# Patient Record
Sex: Female | Born: 1940
Health system: Southern US, Community
[De-identification: ages and names within clinical notes are randomized; demographics above are authoritative.]

## PROBLEM LIST (undated history)

## (undated) DIAGNOSIS — J309 Allergic rhinitis, unspecified: Secondary | ICD-10-CM

## (undated) DIAGNOSIS — R7303 Prediabetes: Secondary | ICD-10-CM

## (undated) DIAGNOSIS — R002 Palpitations: Secondary | ICD-10-CM

## (undated) DIAGNOSIS — I4891 Unspecified atrial fibrillation: Secondary | ICD-10-CM

## (undated) DIAGNOSIS — N059 Unspecified nephritic syndrome with unspecified morphologic changes: Secondary | ICD-10-CM

## (undated) DIAGNOSIS — K5730 Diverticulosis of large intestine without perforation or abscess without bleeding: Secondary | ICD-10-CM

## (undated) DIAGNOSIS — K219 Gastro-esophageal reflux disease without esophagitis: Secondary | ICD-10-CM

## (undated) DIAGNOSIS — T7840XA Allergy, unspecified, initial encounter: Secondary | ICD-10-CM

## (undated) DIAGNOSIS — E7800 Pure hypercholesterolemia, unspecified: Secondary | ICD-10-CM

## (undated) DIAGNOSIS — Z8719 Personal history of other diseases of the digestive system: Secondary | ICD-10-CM

## (undated) DIAGNOSIS — Z87442 Personal history of urinary calculi: Secondary | ICD-10-CM

## (undated) DIAGNOSIS — M25562 Pain in left knee: Secondary | ICD-10-CM

## (undated) DIAGNOSIS — C50911 Malignant neoplasm of unspecified site of right female breast: Secondary | ICD-10-CM

## (undated) DIAGNOSIS — R519 Headache, unspecified: Secondary | ICD-10-CM

## (undated) DIAGNOSIS — M25462 Effusion, left knee: Secondary | ICD-10-CM

## (undated) DIAGNOSIS — S83209A Unspecified tear of unspecified meniscus, current injury, unspecified knee, initial encounter: Secondary | ICD-10-CM

## (undated) DIAGNOSIS — M1990 Unspecified osteoarthritis, unspecified site: Secondary | ICD-10-CM

## (undated) DIAGNOSIS — K5720 Diverticulitis of large intestine with perforation and abscess without bleeding: Secondary | ICD-10-CM

## (undated) DIAGNOSIS — J189 Pneumonia, unspecified organism: Secondary | ICD-10-CM

## (undated) HISTORY — DX: Diverticulitis of large intestine with perforation and abscess without bleeding: K57.20

## (undated) HISTORY — DX: Allergic rhinitis, unspecified: J30.9

## (undated) HISTORY — DX: Palpitations: R00.2

## (undated) HISTORY — PX: BREAST BIOPSY: SHX20

## (undated) HISTORY — DX: Allergy, unspecified, initial encounter: T78.40XA

## (undated) HISTORY — PX: CARDIAC CATHETERIZATION: SHX172

## (undated) HISTORY — PX: COLONOSCOPY: SHX174

## (undated) HISTORY — DX: Unspecified atrial fibrillation: I48.91

## (undated) HISTORY — DX: Gastro-esophageal reflux disease without esophagitis: K21.9

## (undated) HISTORY — DX: Unspecified nephritic syndrome with unspecified morphologic changes: N05.9

## (undated) HISTORY — DX: Pure hypercholesterolemia, unspecified: E78.00

## (undated) HISTORY — PX: COLONOSCOPY W/ BIOPSIES AND POLYPECTOMY: SHX1376

## (undated) HISTORY — DX: Diverticulosis of large intestine without perforation or abscess without bleeding: K57.30

---

## 1964-11-04 HISTORY — PX: VAGINAL HYSTERECTOMY: SUR661

## 1984-11-04 DIAGNOSIS — C50911 Malignant neoplasm of unspecified site of right female breast: Secondary | ICD-10-CM

## 1984-11-04 HISTORY — DX: Malignant neoplasm of unspecified site of right female breast: C50.911

## 1984-11-04 HISTORY — PX: BREAST BIOPSY: SHX20

## 1984-11-04 HISTORY — PX: MASTECTOMY: SHX3

## 1985-11-04 HISTORY — PX: BREAST RECONSTRUCTION: SHX9

## 1985-11-04 HISTORY — PX: REDUCTION MAMMAPLASTY: SUR839

## 1986-11-04 HISTORY — PX: CHOLECYSTECTOMY OPEN: SUR202

## 1999-09-14 ENCOUNTER — Other Ambulatory Visit: Admission: RE | Admit: 1999-09-14 | Discharge: 1999-09-14 | Payer: Self-pay | Admitting: Obstetrics and Gynecology

## 2000-12-20 ENCOUNTER — Inpatient Hospital Stay (HOSPITAL_COMMUNITY): Admission: EM | Admit: 2000-12-20 | Discharge: 2000-12-22 | Payer: Self-pay | Admitting: Emergency Medicine

## 2000-12-20 ENCOUNTER — Encounter: Payer: Self-pay | Admitting: Emergency Medicine

## 2005-05-16 ENCOUNTER — Ambulatory Visit: Payer: Self-pay | Admitting: Internal Medicine

## 2005-07-09 ENCOUNTER — Ambulatory Visit: Payer: Self-pay | Admitting: Internal Medicine

## 2005-08-30 ENCOUNTER — Ambulatory Visit: Payer: Self-pay | Admitting: Internal Medicine

## 2005-11-12 ENCOUNTER — Ambulatory Visit: Payer: Self-pay | Admitting: Internal Medicine

## 2005-12-10 ENCOUNTER — Ambulatory Visit: Payer: Self-pay | Admitting: Internal Medicine

## 2006-03-04 ENCOUNTER — Ambulatory Visit: Payer: Self-pay | Admitting: Internal Medicine

## 2006-04-01 ENCOUNTER — Ambulatory Visit: Payer: Self-pay | Admitting: Internal Medicine

## 2006-08-12 ENCOUNTER — Ambulatory Visit: Payer: Self-pay | Admitting: Internal Medicine

## 2006-08-22 ENCOUNTER — Ambulatory Visit: Payer: Self-pay | Admitting: Internal Medicine

## 2006-08-27 ENCOUNTER — Ambulatory Visit: Payer: Self-pay | Admitting: Internal Medicine

## 2006-09-04 ENCOUNTER — Ambulatory Visit: Payer: Self-pay | Admitting: Internal Medicine

## 2007-05-29 DIAGNOSIS — Z853 Personal history of malignant neoplasm of breast: Secondary | ICD-10-CM | POA: Insufficient documentation

## 2007-05-29 DIAGNOSIS — E785 Hyperlipidemia, unspecified: Secondary | ICD-10-CM | POA: Insufficient documentation

## 2007-05-29 DIAGNOSIS — K219 Gastro-esophageal reflux disease without esophagitis: Secondary | ICD-10-CM | POA: Insufficient documentation

## 2008-05-05 ENCOUNTER — Ambulatory Visit: Payer: Self-pay | Admitting: Internal Medicine

## 2008-05-05 DIAGNOSIS — F329 Major depressive disorder, single episode, unspecified: Secondary | ICD-10-CM | POA: Insufficient documentation

## 2008-05-05 DIAGNOSIS — F411 Generalized anxiety disorder: Secondary | ICD-10-CM | POA: Insufficient documentation

## 2008-05-05 DIAGNOSIS — J309 Allergic rhinitis, unspecified: Secondary | ICD-10-CM | POA: Insufficient documentation

## 2008-05-05 DIAGNOSIS — M899 Disorder of bone, unspecified: Secondary | ICD-10-CM | POA: Insufficient documentation

## 2008-05-05 DIAGNOSIS — I10 Essential (primary) hypertension: Secondary | ICD-10-CM | POA: Insufficient documentation

## 2008-05-05 DIAGNOSIS — Z860100 Personal history of colon polyps, unspecified: Secondary | ICD-10-CM | POA: Insufficient documentation

## 2008-05-05 DIAGNOSIS — M949 Disorder of cartilage, unspecified: Secondary | ICD-10-CM

## 2008-05-05 DIAGNOSIS — K589 Irritable bowel syndrome without diarrhea: Secondary | ICD-10-CM | POA: Insufficient documentation

## 2008-05-06 LAB — CONVERTED CEMR LAB
AST: 18 units/L (ref 0–37)
Alkaline Phosphatase: 62 units/L (ref 39–117)
BUN: 12 mg/dL (ref 6–23)
Bilirubin, Direct: 0.1 mg/dL (ref 0.0–0.3)
Chloride: 104 meq/L (ref 96–112)
Eosinophils Absolute: 0.3 10*3/uL (ref 0.0–0.7)
Eosinophils Relative: 3.5 % (ref 0.0–5.0)
GFR calc non Af Amer: 89 mL/min
Hemoglobin, Urine: NEGATIVE
MCV: 94.4 fL (ref 78.0–100.0)
Mucus, UA: NEGATIVE
Neutrophils Relative %: 56.4 % (ref 43.0–77.0)
Nitrite: NEGATIVE
Platelets: 287 10*3/uL (ref 150–400)
Potassium: 4.7 meq/L (ref 3.5–5.1)
RBC / HPF: NONE SEEN
Sodium: 141 meq/L (ref 135–145)
Total Bilirubin: 0.7 mg/dL (ref 0.3–1.2)
Urine Glucose: NEGATIVE mg/dL
Urobilinogen, UA: 0.2 (ref 0.0–1.0)
WBC: 8.1 10*3/uL (ref 4.5–10.5)

## 2008-05-31 ENCOUNTER — Ambulatory Visit: Payer: Self-pay | Admitting: Internal Medicine

## 2008-08-25 ENCOUNTER — Ambulatory Visit: Payer: Self-pay | Admitting: Internal Medicine

## 2008-11-28 ENCOUNTER — Telehealth (INDEPENDENT_AMBULATORY_CARE_PROVIDER_SITE_OTHER): Payer: Self-pay | Admitting: *Deleted

## 2008-12-06 ENCOUNTER — Telehealth (INDEPENDENT_AMBULATORY_CARE_PROVIDER_SITE_OTHER): Payer: Self-pay | Admitting: *Deleted

## 2009-01-05 ENCOUNTER — Encounter: Admission: RE | Admit: 2009-01-05 | Discharge: 2009-01-05 | Payer: Self-pay | Admitting: Obstetrics and Gynecology

## 2009-01-19 ENCOUNTER — Ambulatory Visit: Payer: Self-pay | Admitting: Family Medicine

## 2009-01-19 ENCOUNTER — Encounter: Payer: Self-pay | Admitting: Internal Medicine

## 2009-02-14 ENCOUNTER — Telehealth (INDEPENDENT_AMBULATORY_CARE_PROVIDER_SITE_OTHER): Payer: Self-pay | Admitting: *Deleted

## 2009-04-20 ENCOUNTER — Ambulatory Visit: Payer: Self-pay | Admitting: Internal Medicine

## 2009-05-01 ENCOUNTER — Telehealth: Payer: Self-pay | Admitting: Internal Medicine

## 2009-05-01 ENCOUNTER — Encounter: Payer: Self-pay | Admitting: Internal Medicine

## 2009-05-03 ENCOUNTER — Ambulatory Visit: Payer: Self-pay | Admitting: Internal Medicine

## 2009-05-04 ENCOUNTER — Encounter: Admission: RE | Admit: 2009-05-04 | Discharge: 2009-05-04 | Payer: Self-pay | Admitting: Orthopedic Surgery

## 2009-06-29 ENCOUNTER — Ambulatory Visit: Payer: Self-pay | Admitting: Internal Medicine

## 2009-06-29 DIAGNOSIS — R300 Dysuria: Secondary | ICD-10-CM | POA: Insufficient documentation

## 2009-06-29 LAB — CONVERTED CEMR LAB
Ketones, ur: NEGATIVE mg/dL
Specific Gravity, Urine: <=1.005 (ref 1.000–1.030)
Total Protein, Urine: NEGATIVE mg/dL
Urine Glucose: NEGATIVE mg/dL
pH: 6.5 (ref 5.0–8.0)

## 2009-06-30 ENCOUNTER — Encounter: Payer: Self-pay | Admitting: Internal Medicine

## 2009-07-06 ENCOUNTER — Encounter: Admission: RE | Admit: 2009-07-06 | Discharge: 2009-07-06 | Payer: Self-pay | Admitting: Obstetrics and Gynecology

## 2009-08-17 ENCOUNTER — Ambulatory Visit: Payer: Self-pay | Admitting: Internal Medicine

## 2009-08-31 ENCOUNTER — Ambulatory Visit: Payer: Self-pay | Admitting: Internal Medicine

## 2009-08-31 DIAGNOSIS — N390 Urinary tract infection, site not specified: Secondary | ICD-10-CM | POA: Insufficient documentation

## 2009-08-31 LAB — CONVERTED CEMR LAB
Bilirubin Urine: NEGATIVE
Ketones, urine, test strip: NEGATIVE
Specific Gravity, Urine: <1.005

## 2009-09-02 ENCOUNTER — Encounter: Payer: Self-pay | Admitting: Internal Medicine

## 2009-09-04 ENCOUNTER — Telehealth (INDEPENDENT_AMBULATORY_CARE_PROVIDER_SITE_OTHER): Payer: Self-pay | Admitting: *Deleted

## 2010-01-25 ENCOUNTER — Encounter: Admission: RE | Admit: 2010-01-25 | Discharge: 2010-01-25 | Payer: Self-pay | Admitting: Obstetrics and Gynecology

## 2010-02-03 ENCOUNTER — Emergency Department (HOSPITAL_BASED_OUTPATIENT_CLINIC_OR_DEPARTMENT_OTHER): Admission: EM | Admit: 2010-02-03 | Discharge: 2010-02-03 | Payer: Self-pay | Admitting: Emergency Medicine

## 2010-02-03 ENCOUNTER — Ambulatory Visit: Payer: Self-pay | Admitting: Radiology

## 2010-04-11 ENCOUNTER — Ambulatory Visit: Payer: Self-pay | Admitting: Internal Medicine

## 2010-04-11 DIAGNOSIS — H6990 Unspecified Eustachian tube disorder, unspecified ear: Secondary | ICD-10-CM | POA: Insufficient documentation

## 2010-04-11 DIAGNOSIS — H6980 Other specified disorders of Eustachian tube, unspecified ear: Secondary | ICD-10-CM | POA: Insufficient documentation

## 2010-06-21 ENCOUNTER — Ambulatory Visit: Payer: Self-pay | Admitting: Cardiology

## 2010-08-09 ENCOUNTER — Ambulatory Visit: Payer: Self-pay | Admitting: Internal Medicine

## 2010-12-04 NOTE — Assessment & Plan Note (Signed)
Summary: ear problem-oyu   Vital Signs:  Patient profile:   70 year old female Height:      61.5 inches Weight:      146.25 pounds BMI:     27.28 O2 Sat:      96 % on Room air Temp:     98.2 degrees F oral Pulse rate:   88 / minute BP sitting:   110 / 68  (left arm) Cuff size:   regular  Vitals Entered ByShirlean Mylar Ewing (April 11, 2010 3:41 PM)  O2 Flow:  Room air CC: left ear pressure/RE   Primary Care Provider:  [name] MD  CC:  left ear pressure/RE.  History of Present Illness: here with left ear pressure, popping, and ocean sounds wihtout hearing loss, veritgo, n/v, headache, ST, cough and Pt denies CP, sob, doe, wheezing, orthopnea, pnd, worsening LE edema, palps, dizziness or syncope   Has feelling of water in the ear but nothing comes out and pain to the ear canal.    Preventive Screening-Counseling & Management      Drug Use:  no.    Problems Prior to Update: 1)  Eustachian Tube Dysfunction, Left  (ICD-381.81) 2)  Uti  (ICD-599.0) 3)  Dysuria  (ICD-788.1) 4)  Diverticulosis, Colon  (ICD-562.10) 5)  Osteopenia  (ICD-733.90) 6)  Anxiety  (ICD-300.00) 7)  Ibs  (ICD-564.1) 8)  Colonic Polyps, Hx of  (ICD-V12.72) 9)  Depression  (ICD-311) 10)  Allergic Rhinitis  (ICD-477.9) 11)  Hypertension  (ICD-401.9) 12)  Hyperlipidemia  (ICD-272.4) 13)  Gerd  (ICD-530.81) 14)  Breast Cancer, Hx of  (ICD-V10.3)  Medications Prior to Update: 1)  Metoprolol Tartrate 50 Mg  Tabs (Metoprolol Tartrate) .... 1/2 By Mouth Bid 2)  Adult Aspirin Ec Low Strength 81 Mg  Tbec (Aspirin) .Marland Kitchen.. 1 By Mouth Once Daily 3)  Dicyclomine Hcl 10 Mg  Caps (Dicyclomine Hcl) .Marland Kitchen.. 1 - 2 By Mouth Q 6 Hrs As Needed Pain 4)  Omeprazole 40 Mg  Cpdr (Omeprazole) .Marland Kitchen.. 1 Each Day 30 Minutes Before Meal 5)  Macrobid 100 Mg Caps (Nitrofurantoin Monohyd Macro) .... One By Mouth Two Times A Day For 5 Days  Current Medications (verified): 1)  Metoprolol Tartrate 50 Mg  Tabs (Metoprolol Tartrate) .... 1/2 By  Mouth Bid 2)  Adult Aspirin Ec Low Strength 81 Mg  Tbec (Aspirin) .Marland Kitchen.. 1 By Mouth Once Daily 3)  Dicyclomine Hcl 10 Mg  Caps (Dicyclomine Hcl) .Marland Kitchen.. 1 - 2 By Mouth Q 6 Hrs As Needed Pain 4)  Omeprazole 40 Mg  Cpdr (Omeprazole) .Marland Kitchen.. 1 Each Day 30 Minutes Before Meal 5)  Fluticasone Propionate 50 Mcg/act Susp (Fluticasone Propionate) .... 2 Spray/side Once Daily 6)  Cetirizine Hcl 10 Mg Tabs (Cetirizine Hcl) .Marland Kitchen.. 1 By Mouth Once Daily As Needed  Allergies (verified): 1)  ! Pcn  Past History:  Past Medical History: Last updated: 05/05/2008 Breast cancer, hx of GERD Hyperlipidemia kidney disease Hypertension Allergic rhinitis Depression Colonic polyps, hx of sympt PVC's IBS Anxiety Osteopenia Diverticulosis, colon  Social History: Last updated: 04/11/2010 Never Smoked Alcohol use-no widow 2 children homemaker Drug use-no  Risk Factors: Smoking Status: never (08/31/2009)  Past Surgical History: s/p right mastectomy Cholecystectomy Hysterectomy s/p right humerus fx april 2011  Social History: Reviewed history from 05/05/2008 and no changes required. Never Smoked Alcohol use-no widow 2 children homemaker Drug use-no Drug Use:  no  Review of Systems       all otherwise negative per pt -  Physical Exam  General:  alert and well-developed.   Head:  normocephalic and no abnormalities observed.   Eyes:  vision grossly intact, pupils equal, and pupils round.   Ears:  R ear normal.  , left tm midl to mod erythema, post fluid, no bulging Nose:  no external deformity and no nasal discharge.   Mouth:  no gingival abnormalities and pharynx pink and moist.   Neck:  supple and no masses.   Lungs:  normal respiratory effort and normal breath sounds.   Heart:  normal rate and regular rhythm.   Extremities:  no edema, no erythema    Impression & Recommendations:  Problem # 1:  EUSTACHIAN TUBE DYSFUNCTION, LEFT (ICD-381.81) with left tinnitus;  d/w pt nature of  the problem , for mucinex, valsalva maneuver as needed, and tx for allergies as below;  would avoid decongestants in her case due to risk  Problem # 2:  ALLERGIC RHINITIS (ICD-477.9)  Her updated medication list for this problem includes:    Fluticasone Propionate 50 Mcg/act Susp (Fluticasone propionate) .Marland Kitchen... 2 spray/side once daily    Cetirizine Hcl 10 Mg Tabs (Cetirizine hcl) .Marland Kitchen... 1 by mouth once daily as needed treat as above, f/u any worsening signs or symptoms   Problem # 3:  OSTEOPENIA (ICD-733.90) d/w pt recent dxa - mild density loss only;  to cont wt bearing excercise;  calcium and vit d; f/u dxa 2 yrs  Problem # 4:  HYPERTENSION (ICD-401.9)  Her updated medication list for this problem includes:    Metoprolol Tartrate 50 Mg Tabs (Metoprolol tartrate) .Marland Kitchen... 1/2 by mouth bid  BP today: 110/68 Prior BP: 128/80 (08/31/2009)  Labs Reviewed: K+: 4.7 (05/05/2008) Creat: : 0.7 (05/05/2008)    stable overall by hx and exam, ok to continue meds/tx as is   Complete Medication List: 1)  Metoprolol Tartrate 50 Mg Tabs (Metoprolol tartrate) .... 1/2 by mouth bid 2)  Adult Aspirin Ec Low Strength 81 Mg Tbec (Aspirin) .Marland Kitchen.. 1 by mouth once daily 3)  Dicyclomine Hcl 10 Mg Caps (Dicyclomine hcl) .Marland Kitchen.. 1 - 2 by mouth q 6 hrs as needed pain 4)  Omeprazole 40 Mg Cpdr (Omeprazole) .Marland Kitchen.. 1 each day 30 minutes before meal 5)  Fluticasone Propionate 50 Mcg/act Susp (Fluticasone propionate) .... 2 spray/side once daily 6)  Cetirizine Hcl 10 Mg Tabs (Cetirizine hcl) .Marland Kitchen.. 1 by mouth once daily as needed  Patient Instructions: 1)  Please take all new medications as prescribed 2)  Continue all previous medications as before this visit  3)  You can also use Mucinex OTC or it's generic for congestion  4)  You can also try the Valsalva maneuver three times daily to help the left ear congestion 5)  Please schedule a follow-up appointment as needed. 6)  Please schedule a follow-up appointment in 4  months with CPX labs Prescriptions: FLUTICASONE PROPIONATE 50 MCG/ACT SUSP (FLUTICASONE PROPIONATE) 2 spray/side once daily  #1 x 11   Entered and Authorized by:   Biagio Borg MD   Signed by:   Biagio Borg MD on 04/11/2010   Method used:   Print then Give to Patient   RxID:   [id number] CETIRIZINE HCL 10 MG TABS (CETIRIZINE HCL) 1 by mouth once daily as needed  #30 x 11   Entered and Authorized by:   Biagio Borg MD   Signed by:   Biagio Borg MD on 04/11/2010   Method used:   Print then Give to  Patient   RxID:   [id number]

## 2010-12-04 NOTE — Assessment & Plan Note (Signed)
Summary: flu shot-lb   Nurse Visit   Allergies: 1)  ! Pcn  Orders Added: 1)  Flu Vaccine 9yrs + MEDICARE PATIENTS [Q2039] 2)  Administration Flu vaccine - MCR [id number]   Flu Vaccine Consent Questions     Do you have a history of severe allergic reactions to this vaccine? no    Any prior history of allergic reactions to egg and/or gelatin? no    Do you have a sensitivity to the preservative Thimersol? no    Do you have a past history of Guillan-Barre Syndrome? no    Do you currently have an acute febrile illness? no    Have you ever had a severe reaction to latex? no    Vaccine information given and explained to patient? yes    Are you currently pregnant? no    Lot Number:[id number]   Exp Date:05/04/2011   Site Given  Left Deltoid IM

## 2010-12-14 ENCOUNTER — Other Ambulatory Visit: Payer: Self-pay | Admitting: Obstetrics and Gynecology

## 2010-12-14 DIAGNOSIS — N63 Unspecified lump in unspecified breast: Secondary | ICD-10-CM

## 2010-12-20 ENCOUNTER — Other Ambulatory Visit: Payer: Self-pay | Admitting: Obstetrics and Gynecology

## 2010-12-20 ENCOUNTER — Ambulatory Visit
Admission: RE | Admit: 2010-12-20 | Discharge: 2010-12-20 | Disposition: A | Discharge status: Home / Self Care | Payer: Medicare Other | Source: Ambulatory Visit | Attending: Obstetrics and Gynecology | Admitting: Obstetrics and Gynecology

## 2010-12-20 DIAGNOSIS — N63 Unspecified lump in unspecified breast: Secondary | ICD-10-CM

## 2010-12-27 ENCOUNTER — Other Ambulatory Visit: Payer: Self-pay | Admitting: Obstetrics and Gynecology

## 2010-12-27 ENCOUNTER — Ambulatory Visit
Admission: RE | Admit: 2010-12-27 | Discharge: 2010-12-27 | Disposition: A | Discharge status: Home / Self Care | Payer: Medicare Other | Source: Ambulatory Visit | Attending: Obstetrics and Gynecology | Admitting: Obstetrics and Gynecology

## 2010-12-27 ENCOUNTER — Other Ambulatory Visit: Payer: Self-pay | Admitting: Radiology

## 2010-12-27 DIAGNOSIS — N63 Unspecified lump in unspecified breast: Secondary | ICD-10-CM

## 2011-01-01 ENCOUNTER — Ambulatory Visit: Payer: Self-pay | Admitting: Cardiology

## 2011-01-02 ENCOUNTER — Ambulatory Visit: Payer: Self-pay | Admitting: Cardiology

## 2011-01-10 ENCOUNTER — Ambulatory Visit: Payer: Self-pay | Admitting: Cardiology

## 2011-01-16 ENCOUNTER — Encounter: Payer: Self-pay | Admitting: Cardiology

## 2011-01-16 DIAGNOSIS — E7800 Pure hypercholesterolemia, unspecified: Secondary | ICD-10-CM | POA: Insufficient documentation

## 2011-01-16 DIAGNOSIS — I471 Supraventricular tachycardia: Secondary | ICD-10-CM | POA: Insufficient documentation

## 2011-01-16 DIAGNOSIS — R002 Palpitations: Secondary | ICD-10-CM | POA: Insufficient documentation

## 2011-01-24 ENCOUNTER — Telehealth: Payer: Self-pay | Admitting: Cardiology

## 2011-01-24 NOTE — Telephone Encounter (Signed)
Patient called and reported heart feels like it is fluttering and skipping a beat. Would like to be seen sooner than scheduled appt.  Please call.

## 2011-01-30 NOTE — Telephone Encounter (Signed)
Spoke with patient last week about heart skipping beats.  Patient stated she had this happen before and Leslie Bryant had her double her GERD meds, wanted to know if ok to do this.  Advised ok to do short term, but to call back if no better.  Patient has not called back.

## 2011-02-06 ENCOUNTER — Encounter: Payer: Self-pay | Admitting: Cardiology

## 2011-02-06 ENCOUNTER — Telehealth: Payer: Self-pay | Admitting: Cardiology

## 2011-02-06 ENCOUNTER — Ambulatory Visit (INDEPENDENT_AMBULATORY_CARE_PROVIDER_SITE_OTHER): Payer: Medicare Other | Admitting: Cardiology

## 2011-02-06 VITALS — BP 130/76 | HR 84 | Wt 151.0 lb

## 2011-02-06 DIAGNOSIS — E7800 Pure hypercholesterolemia, unspecified: Secondary | ICD-10-CM

## 2011-02-06 DIAGNOSIS — Z853 Personal history of malignant neoplasm of breast: Secondary | ICD-10-CM

## 2011-02-06 DIAGNOSIS — M1711 Unilateral primary osteoarthritis, right knee: Secondary | ICD-10-CM | POA: Insufficient documentation

## 2011-02-06 DIAGNOSIS — R002 Palpitations: Secondary | ICD-10-CM

## 2011-02-06 DIAGNOSIS — IMO0002 Reserved for concepts with insufficient information to code with codable children: Secondary | ICD-10-CM

## 2011-02-06 DIAGNOSIS — M1710 Unilateral primary osteoarthritis, unspecified knee: Secondary | ICD-10-CM

## 2011-02-06 LAB — LIPID PANEL
Cholesterol: 237 mg/dL — ABNORMAL HIGH (ref 0–200)
HDL: 52 mg/dL (ref >39.00)
Total CHOL/HDL Ratio: 5
VLDL: 30.8 mg/dL (ref 0.0–40.0)

## 2011-02-06 LAB — BASIC METABOLIC PANEL
BUN: 10 mg/dL (ref 6–23)
Calcium: 9.2 mg/dL (ref 8.4–10.5)
GFR: 97.34 mL/min (ref >60.00)
Potassium: 4 mEq/L (ref 3.5–5.1)
Sodium: 143 mEq/L (ref 135–145)

## 2011-02-06 LAB — HEPATIC FUNCTION PANEL
AST: 19 U/L (ref 0–37)
Albumin: 3.9 g/dL (ref 3.5–5.2)
Total Bilirubin: 0.6 mg/dL (ref 0.3–1.2)

## 2011-02-06 NOTE — Assessment & Plan Note (Addendum)
The patient has gained 5 pounds since last visit.  She is still trying to watch the high cholesterol foods.She had to stop her Crestor about a month ago because of myalgias.  Today's labs will reflect no Crestor for the past 4 or 5 weeks.  If she needs medication we will need to go with a non-statin drug.

## 2011-02-06 NOTE — Progress Notes (Signed)
HPI: This pleasant 70 year old woman is seen for a six-month followup office visit.  She has a past history of palpitations and a past history of supraventricular tachycardia.  She had a past history of chest pain and had a cardiac catheterization on 12/22/00 which showed normal coronary arteries and Catheter-induced spasm.  She has a past history of elevated cholesterols.  She has developed recent myalgias from her Crestor which has been stopped.  His had occasional palpitations.  She does have a past history of documented supraventricular tachycardia which has responded to beta blocker  Current Outpatient Prescriptions  Medication Sig Dispense Refill  . bumetanide (BUMEX) 1 MG tablet Take 1 mg by mouth as needed.        . Calcium Carbonate-Vitamin D (CALCIUM + D PO) Take 1,200 mg by mouth.        . co-enzyme Q-10 30 MG capsule Take 30 mg by mouth 2 (two) times daily.        . metoprolol (LOPRESSOR) 50 MG tablet Take 25 mg by mouth 2 (two) times daily.        . Omega-3 Fatty Acids (OMEGA 3 PO) Take by mouth 3 (three) times daily.        Marland Kitchen omeprazole (PRILOSEC) 40 MG capsule Take 40 mg by mouth daily.        . rosuvastatin (CRESTOR) 10 MG tablet Take 10 mg by mouth daily.          Allergies  Allergen Reactions  . Crestor (Rosuvastatin Calcium)   . Penicillins     Patient Active Problem List  Diagnoses  . HYPERLIPIDEMIA  . ANXIETY  . DEPRESSION  . EUSTACHIAN TUBE DYSFUNCTION, LEFT  . HYPERTENSION  . ALLERGIC RHINITIS  . GERD  . DIVERTICULOSIS, COLON  . IBS  . UTI  . OSTEOPENIA  . DYSURIA  . BREAST CANCER, HX OF  . COLONIC POLYPS, HX OF  . Palpitations  . Supraventricular tachycardia  . Hypercholesterolemia  . Osteoarthritis, knee  . History of breast cancer    History  Smoking status  . Never Smoker   Smokeless tobacco  . Not on file    History  Alcohol Use: Not on file    No family history on file.  Review of Systems: The patient denies any heat or cold  intolerance.  No weight gain or weight loss.  The patient denies headaches or blurry vision.  There is no cough or sputum production.  The patient denies dizziness.  There is no hematuria or hematochezia.  The patient denies any muscle aches or arthritis.  The patient denies any rash.  The patient denies frequent falling or instability.  There is no history of depression or anxiety.  All other systems were reviewed and are negative.   Physical Exam: Filed Vitals:   02/06/11 0856  BP: 130/76  Pulse: 84   Weight is 151, up 6 pounds.Pupils equal and reactive.   Extraocular Movements are full.  There is no scleral icterus.  The mouth and pharynx are normal.  The neck is supple.  The carotids reveal no bruits.  The jugular venous pressure is normal.  The thyroid is not enlarged.  There is no lymphadenopathy.The chest is clear to percussion and auscultation. There are no rales or rhonchi. Expansion of the chest is symmetrical.The precordium is quiet.  The first heart sound is normal.  The second heart sound is physiologically split.  There is no murmur gallop rub or click.  There is no abnormal  lift or heave.The abdomen is soft and nontender. Bowel sounds are normal. The liver and spleen are not enlarged. There Are no abdominal masses. There are no bruits.The pedal pulses are good.  There is no phlebitis or edema.  There is no cyanosis or clubbing.Strength is normal and symmetrical in all extremities.  There is no lateralizing weakness.  There are no sensory deficits.      Assessment / Plan: Await results of today's labs and then decide on further lipid lowering therapy.  Recheck in 6 months for followup office visit and lab work.

## 2011-02-06 NOTE — Telephone Encounter (Signed)
[place] NEED LAST OFFICE NOTE 4/4//12, EKG FAX [phone]  PHONE [phone] EXT [phone]  SURGERY ON WED. 4/11

## 2011-02-06 NOTE — Assessment & Plan Note (Signed)
This patient has a past history of palpitations and supraventricular tachycardia.  Generally she takes her beta blocker just in the morning but occasionally she will take it twice a day if her palpitations are acting up.  She has not been experiencing any recent chest pain.

## 2011-02-06 NOTE — Assessment & Plan Note (Signed)
The patient has been experiencing pain in her right knee for several months.  She has a torn meniscus of the right knee and is scheduled for outpatient surgery by Dr. Gladstone Lighter next week

## 2011-02-15 ENCOUNTER — Other Ambulatory Visit: Payer: Self-pay | Admitting: *Deleted

## 2011-02-15 DIAGNOSIS — R002 Palpitations: Secondary | ICD-10-CM

## 2011-02-15 DIAGNOSIS — E7800 Pure hypercholesterolemia, unspecified: Secondary | ICD-10-CM

## 2011-02-15 MED ORDER — PRAVASTATIN SODIUM 20 MG PO TABS: 20.0000 mg | ORAL_TABLET | Freq: Every evening | ORAL | Status: DC

## 2011-02-15 MED ORDER — METOPROLOL TARTRATE 50 MG PO TABS: 25.0000 mg | ORAL_TABLET | Freq: Two times a day (BID) | ORAL | Status: DC

## 2011-02-15 NOTE — Telephone Encounter (Signed)
Faxed Rx. Pravastatin 20mg  to [facility]

## 2011-02-15 NOTE — Telephone Encounter (Signed)
Faxed Rx. Lopressor to [facility]

## 2011-03-22 NOTE — Cardiovascular Report (Signed)
[place]. [facility]  Patient:    [name], [name]                      MRN: [id number] Proc. Date: 12/22/00 Adm. Date:  [id number] Attending:  Jenne [facility]                        Cardiac Catheterization  INDICATIONS:  [name] presents with chest pain, had myocardial infarction ruled out and referred for catheterization.  PROCEDURE:  Left heart catheterization with selective coronary angiography, left ventricular angiography.  TYPE AND SITE OF ENTRY:  Percutaneous right femoral artery.  CATHETERS:  A 6 French 4 curved Judkins right and left coronary catheters, 6 French pigtail ventriculographic catheter.  CONTRAST MATERIAL:  Omnipaque.  MEDICATIONS GIVEN PRIOR TO THE PROCEDURE:  Valium 10 mg p.o.  MEDICATIONS GIVEN DURING THE PROCEDURE:  Versed 2 mg IV.  COMMENTS:  The patient tolerated the procedure well.  HEMODYNAMIC DATA:  The aortic pressure was 109/64, LV was 109/7.  There was no aortic valve gradient noted on pullback.  ANGIOGRAPHIC DATA: 1. Left main coronary artery:  Left main coronary artery is normal. 2. Left circumflex:  The left circumflex continues as a large obtuse    marginal.  It is normal. 3. There is a small intermediate coronary which is essentially normal. 4. Left anterior descending:  The left anterior descending is a reasonably    large vessel that crosses the apex.  It has at least three diagonal    vessels.  It is normal. 4. Right coronary artery:  The right coronary artery is a moderately large    dominant vessel.  Additionally, we had catheter-induced spasm proximally    that resolved with intracoronary nitroglycerin.  The vessel was normal.  LEFT VENTRICULAR ANGIOGRAM:  Left ventricular angiogram was performed  in the RAO projection.  Overall cardiac size and silhouette are normal.  Left ventricular function is normal.  OVERALL IMPRESSION: 1. Normal left ventricular function. 2. Normal coronary  arteries. 3. Catheter-induced spasm of the right coronary artery with resolution of    nitroglycerin.  DISCUSSION:  It is felt that [name] is stable from a cardiac standpoint. Perclose was used until early ambulation.  Ancef 1 g IV was given. DD:  12/22/00 TD:  12/23/00 Job: [id number]

## 2011-03-22 NOTE — H&P (Signed)
[place]. [facility]  Patient:    Leslie Bryant                        MRN: [id number] Adm. Date:  12/20/00 Attending:  Ludwig Lean. Doreatha Lew, M.D. Dictator:   Marlane Hatcher. Maxwell Caul, R.N., A.N.P.-C. CC:         Joyice Faster. Mare Ferrari, M.D.                         History and Physical  CHIEF COMPLAINT:  Chest pain.  HISTORY OF PRESENT ILLNESS:  Ms. Leslie Bryant is a pleasant 70 year old white female who basically has no real cardiac history.  She presents to the emergency room department today per her family, with a long episode of chest discomfort.  She notes that she was in her previous state of health when going to bed last night.  After getting out of the bed this morning, she began having discomfort in the mid-sternal chest region.  There was no radiation. There was no shortness of breath, no nausea, no vomiting.  It may have somewhat gone into her back.  This lasted for several hours, and she describes it as indigestion.  She used some Coca Cola, with some relief, but actually has not had any medicines until she was here in the emergency room where she was given nitroglycerin and aspirin, with prompt relief.  She is now admitted for further evaluation.  PAST MEDICAL HISTORY: 1. Palpitations. 2. Status post mastectomy on the right, approximately 17 years ago. 3. Status post cholecystectomy. 4. Gastroesophageal reflux disease. 5. Allergies.  No reports of hypertension, diabetes mellitus, or stroke.  Her cholesterol levels are unknown.  ALLERGIES:  PENICILLIN, which causes itching.  CURRENT MEDICATIONS: 1. Prozac 20 mg q.d. 2. Prevacid q.o.d.  She took her last dose yesterday. 3. Allegra q.d.  FAMILY HISTORY:  Father died in his 60s with a heart attack.  Mother died with an enlarged heart at age 8.  She had a total of eight siblings.  One sister has had congestive heart failure; however, cancer is predominantly present among her siblings.  SOCIAL HISTORY:   She is married.  She has two children.  There is no smoking history, no alcohol use.  REVIEW OF SYSTEMS:  She has had no recent fever or flu or chills.  She has had chronic seasonal allergies and takes Allegra for that.  She has had no lightheadedness, no dizziness.  There has been no syncope.  She does have occasional headaches.  She has had no shortness of breath, no edema.  She has some generalized mild arthritic discomfort.  There is no constipation, no diarrhea, no blood in the stool, and otherwise the review of systems is unremarkable.  PHYSICAL EXAMINATION:  GENERAL:  She is a pleasant young white female, who appears younger than her stated age.  She is pain-free.  VITAL SIGNS:  Blood pressure 138/84, heart rate 86, respirations 20.  SKIN:  Warm and dry, color unremarkable.  LUNGS:  Clear to auscultation.  CARDIAC:  A regular rate and rhythm.  ABDOMEN:  Soft, positive bowel sounds, nontender.  EXTREMITIES:  Without edema.  NEUROLOGIC:  Intact.  LABORATORY DATA:  First CPK is negative with a total of 98, MB fraction pending.  Chemistries show a sodium of 140, potassium 4.1, chloride 104, CO2 of 28, BUN 11, creatinine 0.7, glucose 89.  Hematocrit 37, white count 6.6.  A 12-lead electrocardiogram  showing normal sinus rhythm with no acute changes.  OVERALL IMPRESSION: 1. Prolonged episode of chest pain, responsive to nitroglycerin/aspirin. 2. Gastroesophageal reflux disease.  PLAN:  She will be admitted to telemetry.  Will check cardiac enzymes.  Will place on topical nitrates, as well as Plavix therapy for now.  More than likely will need to proceed on with a cardiac catheterization. DD:  12/20/00 TD:  12/20/00 Job: [id number]

## 2011-05-01 ENCOUNTER — Telehealth: Payer: Self-pay | Admitting: Cardiology

## 2011-05-01 DIAGNOSIS — E785 Hyperlipidemia, unspecified: Secondary | ICD-10-CM

## 2011-05-01 MED ORDER — EZETIMIBE 10 MG PO TABS: 10.0000 mg | ORAL_TABLET | Freq: Every day | ORAL | Status: DC

## 2011-05-01 NOTE — Telephone Encounter (Signed)
Started pravastatin and legs started hurting and she couldn't get out and walk like she had been.  Ran out of pravastatin about a week ago and didn't get refilled to see out it was.  Stated it was better.  Please advised.

## 2011-05-01 NOTE — Telephone Encounter (Signed)
Pt called and wanted to talk to you about cholesterol meds

## 2011-05-01 NOTE — Telephone Encounter (Signed)
Spoke with Dr Mare Ferrari and patient will try zetia if she has never taken.  Spoke with patient and she has never taken zetia, will try. Call back if any problems and continue to watch diet.

## 2011-05-02 NOTE — Telephone Encounter (Signed)
agree

## 2011-07-18 ENCOUNTER — Other Ambulatory Visit: Payer: Self-pay | Admitting: Cardiology

## 2011-07-18 DIAGNOSIS — K219 Gastro-esophageal reflux disease without esophagitis: Secondary | ICD-10-CM

## 2011-07-18 NOTE — Telephone Encounter (Signed)
escribe request  

## 2011-08-09 ENCOUNTER — Ambulatory Visit (INDEPENDENT_AMBULATORY_CARE_PROVIDER_SITE_OTHER): Payer: Medicare Other | Admitting: *Deleted

## 2011-08-09 DIAGNOSIS — E785 Hyperlipidemia, unspecified: Secondary | ICD-10-CM

## 2011-08-09 LAB — HEPATIC FUNCTION PANEL
AST: 30 U/L (ref 0–37)
Albumin: 3.9 g/dL (ref 3.5–5.2)
Total Bilirubin: 0.5 mg/dL (ref 0.3–1.2)

## 2011-08-09 LAB — LIPID PANEL
Cholesterol: 175 mg/dL (ref 0–200)
HDL: 53 mg/dL (ref >39.00)
LDL Cholesterol: 105 mg/dL — ABNORMAL HIGH (ref 0–99)
Triglycerides: 84 mg/dL (ref 0.0–149.0)
VLDL: 16.8 mg/dL (ref 0.0–40.0)

## 2011-08-09 LAB — BASIC METABOLIC PANEL
BUN: 12 mg/dL (ref 6–23)
Calcium: 8.9 mg/dL (ref 8.4–10.5)
Creatinine, Ser: 0.7 mg/dL (ref 0.4–1.2)
GFR: 83.51 mL/min (ref >60.00)
Glucose, Bld: 103 mg/dL — ABNORMAL HIGH (ref 70–99)
Potassium: 4 mEq/L (ref 3.5–5.1)

## 2011-08-14 ENCOUNTER — Ambulatory Visit (INDEPENDENT_AMBULATORY_CARE_PROVIDER_SITE_OTHER): Payer: Medicare Other | Admitting: Cardiology

## 2011-08-14 VITALS — BP 116/76 | HR 106 | Wt 152.0 lb

## 2011-08-14 DIAGNOSIS — IMO0002 Reserved for concepts with insufficient information to code with codable children: Secondary | ICD-10-CM

## 2011-08-14 DIAGNOSIS — M1710 Unilateral primary osteoarthritis, unspecified knee: Secondary | ICD-10-CM

## 2011-08-14 DIAGNOSIS — I119 Hypertensive heart disease without heart failure: Secondary | ICD-10-CM

## 2011-08-14 DIAGNOSIS — R5381 Other malaise: Secondary | ICD-10-CM

## 2011-08-14 DIAGNOSIS — R002 Palpitations: Secondary | ICD-10-CM

## 2011-08-14 DIAGNOSIS — E7800 Pure hypercholesterolemia, unspecified: Secondary | ICD-10-CM

## 2011-08-14 DIAGNOSIS — R5383 Other fatigue: Secondary | ICD-10-CM | POA: Insufficient documentation

## 2011-08-14 DIAGNOSIS — K219 Gastro-esophageal reflux disease without esophagitis: Secondary | ICD-10-CM

## 2011-08-14 NOTE — Assessment & Plan Note (Signed)
The patient has a history of intermittent palpitations.  Since last, visit.  She's had no sustained arrhythmias.  She does complain of occasional chest pain, which is relieved spontaneously.

## 2011-08-14 NOTE — Assessment & Plan Note (Signed)
The patient has hypercholesterolemia.  She did not tolerate Crestor.  She is tolerating Zetia.

## 2011-08-14 NOTE — Progress Notes (Signed)
Leslie Bryant REVEAL Date of Birth:  09/23/1941 [facility] / [facility] [id number] [address]   [place], [place]  [address] [phone]           Fax   [phone]  History of Present Illness: This pleasant 70 year old woman is seen for a scheduled followup office visit.  She has a history of palpitations and supraventricular tachycardia.  She's also had a past history of elevated cholesterol.  She does not have any history of ischemic heart disease.  She presented in 2002 with chest pain and underwent cardiac catheterization by Dr. Tawnya Crook which showed normal coronary arteries and catheter-induced spasm of the right coronary artery with resolution with nitroglycerin.  Her left ventricular function was normal at that time.  Current Outpatient Prescriptions  Medication Sig Dispense Refill  . bumetanide (BUMEX) 1 MG tablet Take 1 mg by mouth as needed.        . Calcium Carbonate-Vitamin D (CALCIUM + D PO) Take 1,200 mg by mouth.        . co-enzyme Q-10 30 MG capsule Take 30 mg by mouth 2 (two) times daily.        Marland Kitchen ezetimibe (ZETIA) 10 MG tablet Take 1 tablet (10 mg total) by mouth daily.  90 tablet  3  . metoprolol (LOPRESSOR) 50 MG tablet Take 0.5 tablets (25 mg total) by mouth 2 (two) times daily.  90 tablet  3  . Multiple Vitamins-Minerals (CENTRUM SILVER PO) Take by mouth daily.        . Omega-3 Fatty Acids (OMEGA 3 PO) Take by mouth 3 (three) times daily.        Marland Kitchen omeprazole (PRILOSEC) 40 MG capsule TAKE 1 CAPSULE DAILY  90 capsule  2    Allergies  Allergen Reactions  . Crestor (Rosuvastatin Calcium)   . Lipitor (Atorvastatin Calcium)     Myalgias   . Penicillins   . Pravastatin     myalgias    Patient Active Problem List  Diagnoses  . HYPERLIPIDEMIA  . ANXIETY  . DEPRESSION  . EUSTACHIAN TUBE DYSFUNCTION, LEFT  . HYPERTENSION  . ALLERGIC RHINITIS  . GERD  . DIVERTICULOSIS, COLON  . IBS  . UTI  . OSTEOPENIA  . DYSURIA  . BREAST CANCER, HX OF  . COLONIC  POLYPS, HX OF  . Palpitations  . Supraventricular tachycardia  . Hypercholesterolemia  . Osteoarthritis, knee  . History of breast cancer    History  Smoking status  . Never Smoker   Smokeless tobacco  . Not on file    History  Alcohol Use: Not on file    Family History  Problem Relation Age of Onset  . Heart attack Father   . Heart failure Sister     Review of Systems: Constitutional: no fever chills diaphoresis or fatigue or change in weight.  Head and neck: no hearing loss, no epistaxis, no photophobia or visual disturbance. Respiratory: No cough, shortness of breath or wheezing. Cardiovascular: No chest pain peripheral edema, palpitations. Gastrointestinal: No abdominal distention, no abdominal pain, no change in bowel habits hematochezia or melena. Genitourinary: No dysuria, no frequency, no urgency, no nocturia. Musculoskeletal:No arthralgias, no back pain, no gait disturbance or myalgias. Neurological: No dizziness, no headaches, no numbness, no seizures, no syncope, no weakness, no tremors. Hematologic: No lymphadenopathy, no easy bruising. Psychiatric: No confusion, no hallucinations, no sleep disturbance.    Physical Exam: Filed Vitals:   08/14/11 1617  BP: 116/76  Pulse: 106  Gen. appearance reveals a well-developed, well-nourished woman in no distress.Pupils equal and reactive.   Extraocular Movements are full.  There is no scleral icterus.  The mouth and pharynx are normal.  The neck is supple.  The carotids reveal no bruits.  The jugular venous pressure is normal.  The thyroid is not enlarged.  There is no lymphadenopathy.  The chest is clear to percussion and auscultation. There are no rales or rhonchi. Expansion of the chest is symmetrical.  The precordium is quiet.  The first heart sound is normal.  The second heart sound is physiologically split.  There is no murmur gallop rub or click.  There is no abnormal lift or heave.  The abdomen is soft and  nontender. Bowel sounds are normal. The liver and spleen are not enlarged. There Are no abdominal masses. There are no bruits.  The pedal pulses are good.  There is no phlebitis or edema.  There is no cyanosis or clubbing. Strength is normal and symmetrical in all extremities.  There is no lateralizing weakness.  There are no sensory deficits.  EKG interpreted by me shows sinus tachycardia and borderline low voltage, but no acute ischemic changes  Assessment / Plan:

## 2011-08-14 NOTE — Patient Instructions (Signed)
continue same dose of medications  Your physician wants you to follow-up in: 6 months You will receive a reminder letter in the mail two months in advance. If you don't receive a letter, please call our office to schedule the follow-up appointment.

## 2011-08-14 NOTE — Assessment & Plan Note (Signed)
The patient has been experiencing some osteoarthritic discomfort of her knees, which prevents her from walking as much as she would like to do.  No surgery is anticipated at this time

## 2011-09-05 ENCOUNTER — Ambulatory Visit (INDEPENDENT_AMBULATORY_CARE_PROVIDER_SITE_OTHER): Payer: Medicare Other | Admitting: *Deleted

## 2011-09-05 DIAGNOSIS — Z23 Encounter for immunization: Secondary | ICD-10-CM

## 2011-11-02 ENCOUNTER — Ambulatory Visit (HOSPITAL_COMMUNITY)
Admission: RE | Admit: 2011-11-02 | Discharge: 2011-11-02 | Disposition: A | Discharge status: Home / Self Care | Payer: Medicare Other | Source: Ambulatory Visit | Attending: Family Medicine | Admitting: Family Medicine

## 2011-11-02 ENCOUNTER — Other Ambulatory Visit: Payer: Self-pay | Admitting: Radiology

## 2011-11-02 ENCOUNTER — Other Ambulatory Visit: Payer: Self-pay | Admitting: Family Medicine

## 2011-11-02 ENCOUNTER — Ambulatory Visit (INDEPENDENT_AMBULATORY_CARE_PROVIDER_SITE_OTHER): Payer: Medicare Other | Admitting: Family Medicine

## 2011-11-02 ENCOUNTER — Encounter (HOSPITAL_COMMUNITY): Payer: Self-pay | Admitting: *Deleted

## 2011-11-02 ENCOUNTER — Encounter: Payer: Self-pay | Admitting: Family Medicine

## 2011-11-02 ENCOUNTER — Inpatient Hospital Stay (HOSPITAL_COMMUNITY)
Admission: EM | Admit: 2011-11-02 | Discharge: 2011-11-08 | DRG: 392 | Disposition: A | Discharge status: Home Health | Payer: Medicare Other | Attending: Surgery | Admitting: Surgery

## 2011-11-02 VITALS — BP 118/74 | Temp 99.5°F | Wt 151.0 lb

## 2011-11-02 DIAGNOSIS — F3289 Other specified depressive episodes: Secondary | ICD-10-CM | POA: Diagnosis present

## 2011-11-02 DIAGNOSIS — K5732 Diverticulitis of large intestine without perforation or abscess without bleeding: Secondary | ICD-10-CM

## 2011-11-02 DIAGNOSIS — R509 Fever, unspecified: Secondary | ICD-10-CM

## 2011-11-02 DIAGNOSIS — Z888 Allergy status to other drugs, medicaments and biological substances status: Secondary | ICD-10-CM

## 2011-11-02 DIAGNOSIS — Z860100 Personal history of colon polyps, unspecified: Secondary | ICD-10-CM

## 2011-11-02 DIAGNOSIS — R1031 Right lower quadrant pain: Secondary | ICD-10-CM

## 2011-11-02 DIAGNOSIS — K5720 Diverticulitis of large intestine with perforation and abscess without bleeding: Secondary | ICD-10-CM

## 2011-11-02 DIAGNOSIS — Z87440 Personal history of urinary (tract) infections: Secondary | ICD-10-CM

## 2011-11-02 DIAGNOSIS — Z853 Personal history of malignant neoplasm of breast: Secondary | ICD-10-CM

## 2011-11-02 DIAGNOSIS — K630 Abscess of intestine: Secondary | ICD-10-CM | POA: Diagnosis present

## 2011-11-02 DIAGNOSIS — I10 Essential (primary) hypertension: Secondary | ICD-10-CM | POA: Diagnosis present

## 2011-11-02 DIAGNOSIS — Z23 Encounter for immunization: Secondary | ICD-10-CM

## 2011-11-02 DIAGNOSIS — Z880 Allergy status to penicillin: Secondary | ICD-10-CM

## 2011-11-02 DIAGNOSIS — F411 Generalized anxiety disorder: Secondary | ICD-10-CM | POA: Diagnosis present

## 2011-11-02 DIAGNOSIS — R109 Unspecified abdominal pain: Secondary | ICD-10-CM

## 2011-11-02 DIAGNOSIS — F329 Major depressive disorder, single episode, unspecified: Secondary | ICD-10-CM | POA: Diagnosis present

## 2011-11-02 HISTORY — DX: Diverticulitis of large intestine with perforation and abscess without bleeding: K57.20

## 2011-11-02 HISTORY — DX: Unspecified osteoarthritis, unspecified site: M19.90

## 2011-11-02 LAB — BASIC METABOLIC PANEL
CO2: 27 mEq/L (ref 19–32)
Calcium: 9.8 mg/dL (ref 8.4–10.5)
Chloride: 93 mEq/L — ABNORMAL LOW (ref 96–112)
Creatinine, Ser: 0.72 mg/dL (ref 0.50–1.10)
Glucose, Bld: 97 mg/dL (ref 70–99)

## 2011-11-02 LAB — POCT I-STAT, CHEM 8
BUN: 8 mg/dL (ref 6–23)
Chloride: 101 mEq/L (ref 96–112)
Creatinine, Ser: 0.8 mg/dL (ref 0.50–1.10)
Glucose, Bld: 94 mg/dL (ref 70–99)
Hemoglobin: 12.9 g/dL (ref 12.0–15.0)
Potassium: 3.8 mEq/L (ref 3.5–5.1)

## 2011-11-02 LAB — DIFFERENTIAL
Basophils Absolute: 0 10*3/uL (ref 0.0–0.1)
Basophils Relative: 0 % (ref 0–1)
Lymphocytes Relative: 17 % (ref 12–46)
Lymphs Abs: 2.6 10*3/uL (ref 0.7–4.0)
Monocytes Absolute: 1.6 10*3/uL — ABNORMAL HIGH (ref 0.1–1.0)
Monocytes Absolute: 1.5 10*3/uL — ABNORMAL HIGH (ref 0.1–1.0)
Monocytes Relative: 11 % (ref 3–12)
Monocytes Relative: 10 % (ref 3–12)
Neutro Abs: 10.1 10*3/uL — ABNORMAL HIGH (ref 1.7–7.7)
Neutro Abs: 10.3 10*3/uL — ABNORMAL HIGH (ref 1.7–7.7)

## 2011-11-02 LAB — CBC
HCT: 35.1 % — ABNORMAL LOW (ref 36.0–46.0)
HCT: 35.6 % — ABNORMAL LOW (ref 36.0–46.0)
Hemoglobin: 12.5 g/dL (ref 12.0–15.0)
MCH: 31.3 pg (ref 26.0–34.0)
MCHC: 35.1 g/dL (ref 30.0–36.0)
MCV: 91.4 fL (ref 78.0–100.0)
Platelets: 316 10*3/uL (ref 150–400)
RBC: 3.84 MIL/uL — ABNORMAL LOW (ref 3.87–5.11)
RBC: 3.91 MIL/uL (ref 3.87–5.11)
WBC: 14.2 10*3/uL — ABNORMAL HIGH (ref 4.0–10.5)

## 2011-11-02 MED ORDER — PANTOPRAZOLE SODIUM 40 MG IV SOLR
40.0000 mg | Freq: Every day | INTRAVENOUS | Status: DC
  Administered 2011-11-02: 21:00:00 via INTRAVENOUS
  Administered 2011-11-03 – 2011-11-07 (×5): 40 mg via INTRAVENOUS
  Filled 2011-11-02 (×7): qty 40

## 2011-11-02 MED ORDER — ONDANSETRON HCL 4 MG/2ML IJ SOLN: 4.0000 mg | Freq: Four times a day (QID) | INTRAMUSCULAR | Status: DC | PRN

## 2011-11-02 MED ORDER — METOPROLOL TARTRATE 25 MG PO TABS
ORAL_TABLET | ORAL | Status: AC
  Filled 2011-11-02: qty 1

## 2011-11-02 MED ORDER — SODIUM CHLORIDE 0.9 % IV BOLUS (SEPSIS)
500.0000 mL | Freq: Once | INTRAVENOUS | Status: DC
  Administered 2011-11-02: 20:00:00 via INTRAVENOUS

## 2011-11-02 MED ORDER — METRONIDAZOLE IN NACL 5-0.79 MG/ML-% IV SOLN
500.0000 mg | Freq: Three times a day (TID) | INTRAVENOUS | Status: DC
  Administered 2011-11-03 – 2011-11-08 (×16): 500 mg via INTRAVENOUS
  Filled 2011-11-02 (×18): qty 100

## 2011-11-02 MED ORDER — METRONIDAZOLE IN NACL 5-0.79 MG/ML-% IV SOLN
500.0000 mg | Freq: Once | INTRAVENOUS | Status: DC
  Administered 2011-11-02: 500 mg via INTRAVENOUS
  Filled 2011-11-02: qty 100

## 2011-11-02 MED ORDER — FENTANYL CITRATE 0.05 MG/ML IJ SOLN
25.0000 ug | INTRAMUSCULAR | Status: DC | PRN
  Administered 2011-11-02 – 2011-11-07 (×5): 25 ug via INTRAVENOUS
  Filled 2011-11-02 (×5): qty 2

## 2011-11-02 MED ORDER — METOPROLOL TARTRATE 25 MG PO TABS
25.0000 mg | ORAL_TABLET | Freq: Two times a day (BID) | ORAL | Status: DC
  Administered 2011-11-02 – 2011-11-08 (×12): 25 mg via ORAL
  Filled 2011-11-02 (×14): qty 1

## 2011-11-02 MED ORDER — CIPROFLOXACIN IN D5W 400 MG/200ML IV SOLN
400.0000 mg | Freq: Two times a day (BID) | INTRAVENOUS | Status: DC
  Administered 2011-11-02 – 2011-11-08 (×12): 400 mg via INTRAVENOUS
  Filled 2011-11-02 (×13): qty 200

## 2011-11-02 MED ORDER — IOHEXOL 300 MG/ML  SOLN
100.0000 mL | Freq: Once | INTRAMUSCULAR | Status: AC | PRN
  Administered 2011-11-02: 100 mL via INTRAVENOUS

## 2011-11-02 MED ORDER — POTASSIUM CHLORIDE IN NACL 20-0.9 MEQ/L-% IV SOLN
INTRAVENOUS | Status: DC
  Administered 2011-11-02 – 2011-11-03 (×4): via INTRAVENOUS
  Filled 2011-11-02 (×6): qty 1000

## 2011-11-02 MED ORDER — PANTOPRAZOLE SODIUM 40 MG IV SOLR
INTRAVENOUS | Status: AC
  Filled 2011-11-02: qty 40

## 2011-11-02 MED ORDER — CIPROFLOXACIN IN D5W 400 MG/200ML IV SOLN
400.0000 mg | Freq: Once | INTRAVENOUS | Status: DC
  Filled 2011-11-02: qty 200

## 2011-11-02 NOTE — H&P (Signed)
Leslie Bryant is an 70 y.o. female.   Chief Complaint: Abdominal pain HPI: This is a 70 year old female who presents with a two-day history of left lower quadrant abdominal pain. She saw her primary care physician today. She was started on antibiotics a CAT scan of the abdomen and pelvis was ordered. Because of findings on the CAT scan she was then sent to the emergency department. The patient describes sharp, moderate pain in the left lower quadrant. She has had no nausea or vomiting. Bowel movements are normal. The pain does not refer anywhere else. She had been having some epigastric discomfort which is now improved. She has no previous history of diverticulitis or abdominal pain. Currently she is comfortable when not moving.  Past Medical History  Diagnosis Date  . Palpitations   . Supraventricular tachycardia   . Hypercholesterolemia   . Broken humerus     Right  . Labile hypertension   . GERD (gastroesophageal reflux disease)   . Chest pain, atypical   . Depression   . Gall bladder stones   . Ulcer     History of ulcer    Past Surgical History  Procedure Date  . Mastectomy 1986    Right  . Cholecystectomy     Family History  Problem Relation Age of Onset  . Heart attack Father   . Heart failure Sister    Social History:  reports that she has never smoked. She does not have any smokeless tobacco history on file. Her alcohol and drug histories not on file.  Allergies:  Allergies  Allergen Reactions  . Crestor (Rosuvastatin Calcium) Other (See Comments)    Shoulder pain  . Lipitor (Atorvastatin Calcium) Other (See Comments)    Myalgias   . Penicillins   . Pravastatin Other (See Comments)    myalgias    Medications Prior to Admission  Medication Dose Route Frequency Provider Last Rate Last Dose  . ciprofloxacin (CIPRO) IVPB 400 mg  400 mg Intravenous Once Domenic Moras, PA      . iohexol (OMNIPAQUE) 300 MG/ML solution 100 mL  100 mL Intravenous Once PRN Medication  Radiologist   100 mL at 11/02/11 1411  . metroNIDAZOLE (FLAGYL) IVPB 500 mg  500 mg Intravenous Once Domenic Moras, PA   500 mg at 11/02/11 1954  . sodium chloride 0.9 % bolus 500 mL  500 mL Intravenous Once Domenic Moras, PA       Medications Prior to Admission  Medication Sig Dispense Refill  . Calcium Carbonate-Vitamin D (CALCIUM + D PO) Take 1,200 mg by mouth.        . co-enzyme Q-10 30 MG capsule Take 30 mg by mouth 2 (two) times daily.       Marland Kitchen ezetimibe (ZETIA) 10 MG tablet Take 1 tablet (10 mg total) by mouth daily.  90 tablet  3  . metoprolol (LOPRESSOR) 50 MG tablet Take 0.5 tablets (25 mg total) by mouth 2 (two) times daily.  90 tablet  3  . Multiple Vitamins-Minerals (CENTRUM SILVER PO) Take 1 tablet by mouth daily.       . Omega-3 Fatty Acids (OMEGA 3 PO) Take 2 tablets by mouth daily.       Marland Kitchen omeprazole (PRILOSEC) 40 MG capsule TAKE 1 CAPSULE DAILY  90 capsule  2    Results for orders placed during the hospital encounter of 11/02/11 (from the past 48 hour(s))  BASIC METABOLIC PANEL     Status: Abnormal   Collection Time  11/02/11 12:40 PM      Component Value Range Comment   Sodium 130 (*) 135 - 145 (mEq/L)    Potassium 4.1  3.5 - 5.1 (mEq/L)    Chloride 93 (*) 96 - 112 (mEq/L)    CO2 27  19 - 32 (mEq/L)    Glucose, Bld 97  70 - 99 (mg/dL)    BUN 11  6 - 23 (mg/dL)    Creatinine, Ser 0.72  0.50 - 1.10 (mg/dL)    Calcium 9.8  8.4 - 10.5 (mg/dL)    GFR calc non Af Amer 84 (*) >90 (mL/min)    GFR calc Af Amer >90  >90 (mL/min)   CBC     Status: Abnormal   Collection Time   11/02/11 12:40 PM      Component Value Range Comment   WBC 14.2 (*) 4.0 - 10.5 (K/uL)    RBC 3.84 (*) 3.87 - 5.11 (MIL/uL)    Hemoglobin 12.0  12.0 - 15.0 (g/dL)    HCT 35.1 (*) 36.0 - 46.0 (%)    MCV 91.4  78.0 - 100.0 (fL)    MCH 31.3  26.0 - 34.0 (pg)    MCHC 34.2  30.0 - 36.0 (g/dL)    RDW 12.1  11.5 - 15.5 (%)    Platelets 316  150 - 400 (K/uL)   DIFFERENTIAL     Status: Abnormal   Collection  Time   11/02/11 12:40 PM      Component Value Range Comment   Neutrophils Relative 72  43 - 77 (%)    Neutro Abs 10.1 (*) 1.7 - 7.7 (K/uL)    Lymphocytes Relative 17  12 - 46 (%)    Lymphs Abs 2.3  0.7 - 4.0 (K/uL)    Monocytes Relative 11  3 - 12 (%)    Monocytes Absolute 1.6 (*) 0.1 - 1.0 (K/uL)    Eosinophils Relative 1  0 - 5 (%)    Eosinophils Absolute 0.1  0.0 - 0.7 (K/uL)    Basophils Relative 0  0 - 1 (%)    Basophils Absolute 0.0  0.0 - 0.1 (K/uL)    Ct Abdomen Pelvis W Contrast  11/02/2011  *RADIOLOGY REPORT*  Clinical Data: Fever.  Abdominal pain with guarding in the right lower quadrant.  CT ABDOMEN AND PELVIS WITH CONTRAST  Technique:  Multidetector CT imaging of the abdomen and pelvis was performed following the standard protocol during bolus administration of intravenous contrast.  Contrast: 156mL OMNIPAQUE IOHEXOL 300 MG/ML IV SOLN  Comparison: None.  Findings: The patient has perforated focal sigmoid diverticulitis with two small abscesses in the pelvis, one being 2.5 cm in diameter and the other being 17 mm in diameter. There is a small amount of air in one of the abscesses.  There is a single small bubble of air just to the left of the larger abscess.  There is a focal sigmoid phlegmon measuring approximately 4.7 cm in length.  The remainder of the bowel demonstrates no acute abnormality. Appendix and terminal ileum are normal.  Diffuse mild hepatic steatosis.  Previous cholecystectomy.  Spleen, pancreas, adrenal glands, and kidneys are normal.  Uterus has been removed.  Ovaries are normal.  No significant osseous abnormality.  IMPRESSION: Perforated sigmoid diverticulitis with two small adjacent abscesses and a 4.7 cm sigmoid phlegmon at the site of the diverticular disease.  Original Report Authenticated By: Larey Seat, M.D.    Review of Systems  Constitutional: Negative.  HENT: Negative.   Eyes: Negative.   Respiratory: Negative.   Cardiovascular: Positive for  palpitations.  Gastrointestinal: Positive for abdominal pain.       Pain in LLQ  Genitourinary: Negative.   Musculoskeletal: Negative.   Skin: Negative.   Neurological: Negative.   Endo/Heme/Allergies: Negative.   Psychiatric/Behavioral: Negative.     Blood pressure 136/79, pulse 122, temperature 99.2 F (37.3 C), temperature source Oral, resp. rate 20, SpO2 98.00%. Physical Exam  Constitutional: She is oriented to person, place, and time. She appears well-developed and well-nourished. No distress.  HENT:  Head: Normocephalic and atraumatic.  Right Ear: External ear normal.  Left Ear: External ear normal.  Nose: Nose normal.  Mouth/Throat: Oropharynx is clear and moist. No oropharyngeal exudate.  Eyes: Conjunctivae are normal. Pupils are equal, round, and reactive to light. No scleral icterus.  Neck: Normal range of motion. Neck supple. No tracheal deviation present. No thyromegaly present.  Cardiovascular: Regular rhythm and normal heart sounds.   No murmur heard.      tachycardic  Respiratory: Effort normal and breath sounds normal. No respiratory distress. She has no wheezes.  GI: Soft. There is tenderness. There is guarding.       Moderate to severe tenderness in LLQ.  Rest of Abdomen soft and non tender  Musculoskeletal: Normal range of motion. She exhibits no edema and no tenderness.  Lymphadenopathy:    She has no cervical adenopathy.  Neurological: She is alert and oriented to person, place, and time.  Skin: Skin is warm and dry. No rash noted. She is not diaphoretic. No erythema.  Psychiatric: Her behavior is normal. Judgment normal.     Assessment/Plan This is a 70 year old female with focally perforated sigmoid diverticulitis and 2 small pelvic abscesses. Currently she is stable and is only focally tender. I do believe this is a contained perforation. I discussed this with her and her family. I'm going to attempt conservative management with bowel rest and IV  antibiotics. Should her condition change, she will need emergent exploratory laparotomy with partial colectomy and colostomy. If she improves, she still may need a repeat CAT scan to see if there is a drainable abscess. We will discuss the CAT scan with interventional radiology currently to see if the small abscesses can be drained, but I suspect they're too small this point.  Marykate Heuberger A 11/02/2011, 8:04 PM

## 2011-11-02 NOTE — Progress Notes (Signed)
  Subjective:    Patient ID: Leslie Bryant, female    DOB: [birth year], 70 y.o.   MRN: [id number]  HPI Sinus sx for about 2 weeks, though feel this is slightly better with these sxs. 2 days ago started having severe stomach cramps. Felt like gas but couldn't get rid of it.  Bowels are moving normally.  Occ nauseated.  Hx of of IBS.  Ran a fever thrusday night when had the cramping.  + chills, No blood in the stool.  Feels better when doesn't eat.  When sits feels a pressure in her stomach, worse.  Normally hs 2 BM a day. Generalized cramping.  Feels sore near her umbilicus.  Tried gas-x, Pepto-bismol, and drinking gingerale to try to burp.  Has had low grade fevers since then. Hx of cholecystectomy. + diverticulosis of colon and IBS.    Review of Systems     BP 118/74  Temp(Src) 99.5 F (37.5 C) (Oral)  Wt 151 lb (68.493 kg)    Allergies  Allergen Reactions  . Crestor (Rosuvastatin Calcium)   . Lipitor (Atorvastatin Calcium)     Myalgias   . Penicillins   . Pravastatin     myalgias    Past Medical History  Diagnosis Date  . Palpitations   . Supraventricular tachycardia   . Hypercholesterolemia   . Broken humerus     Right  . Labile hypertension   . GERD (gastroesophageal reflux disease)   . Chest pain, atypical   . Depression   . Gall bladder stones   . Ulcer     History of ulcer    Past Surgical History  Procedure Date  . Mastectomy 1986    Right  . Cholecystectomy     History   Social History  . Marital Status: Married    Spouse Name: N/A    Number of Children: N/A  . Years of Education: N/A   Occupational History  . Not on file.   Social History Main Topics  . Smoking status: Never Smoker   . Smokeless tobacco: Not on file  . Alcohol Use: Not on file  . Drug Use: Not on file  . Sexually Active: Not on file   Other Topics Concern  . Not on file   Social History Narrative  . No narrative on file    Family History  Problem Relation Age of Onset    . Heart attack Father   . Heart failure Sister      Objective:   Physical Exam  Constitutional: She is oriented to person, place, and time. She appears well-developed and well-nourished.  HENT:  Head: Normocephalic and atraumatic.  Cardiovascular: Normal rate, regular rhythm and normal heart sounds.   Pulmonary/Chest: Effort normal and breath sounds normal.  Abdominal: Soft.       Hyperactive BS and guarding in the RLQ. Mildly tender in the LUQ.   Neurological: She is alert and oriented to person, place, and time.  Skin: Skin is warm and dry.  Psychiatric: She has a normal mood and affect. Her behavior is normal.          Assessment & Plan:  RLQ tenderness with abdominal cramping and fever. Send for abd/pelvis CT with contrast to eval for appendicitis vs diverticulitis. Called radiology and they are scheduling appt.  Will need BMP first. Last cr in October was normal. Will check CBC w/ diff as well. Will call with the results. Called and spoke with CT tech st [facility].

## 2011-11-02 NOTE — ED Notes (Signed)
Pt reports RLQ pain since Thursday night, denies vomiting, last bm was this morning. Went for ct scan today, +perforated diverticulosis. Pt to be seen by edp for admission. No acute distress ntoed at triage.

## 2011-11-02 NOTE — ED Provider Notes (Signed)
History    this is a 70 year old female who was sent from her primary care Dr. to the hospital for admission due to a perforated sigmoid diverticulitis found on outpt CT scan today. Pt sts, for the past several days patient has been complaining of low abdominal pain. Pain begins abruptly on Thursday night follows with nausea, chills and fever. Patient has been seen by her primary care Dr. today and had her abd CT scan.  She was instructed to come to ER to be admitted.  She is currently having 5 out of 10 pain, but does not request pain medication.  She denies headache, cp, sob.    CSN: [id number]  Arrival date & time 11/02/11  [id number]   First MD Initiated Contact with Patient 11/02/11 1902      Chief Complaint  Patient presents with  . Abdominal Pain    (Consider location/radiation/quality/duration/timing/severity/associated sxs/prior treatment) HPI  Past Medical History  Diagnosis Date  . Palpitations   . Supraventricular tachycardia   . Hypercholesterolemia   . Broken humerus     Right  . Labile hypertension   . GERD (gastroesophageal reflux disease)   . Chest pain, atypical   . Depression   . Gall bladder stones   . Ulcer     History of ulcer    Past Surgical History  Procedure Date  . Mastectomy 1986    Right  . Cholecystectomy     Family History  Problem Relation Age of Onset  . Heart attack Father   . Heart failure Sister     History  Substance Use Topics  . Smoking status: Never Smoker   . Smokeless tobacco: Not on file  . Alcohol Use: Not on file    OB History    Grav Para Term Preterm Abortions TAB SAB Ect Mult Living                  Review of Systems  All other systems reviewed and are negative.    Allergies  Crestor; Lipitor; Penicillins; and Pravastatin  Home Medications   Current Outpatient Rx  Name Route Sig Dispense Refill  . ASPIRIN EC 81 MG PO TBEC Oral Take 81 mg by mouth daily.      Marland Kitchen CALCIUM + D PO Oral Take 1,200 mg by mouth.       Marland Kitchen COENZYME Q10 30 MG PO CAPS Oral Take 30 mg by mouth 2 (two) times daily.     Marland Kitchen EZETIMIBE 10 MG PO TABS Oral Take 1 tablet (10 mg total) by mouth daily. 90 tablet 3  . METOPROLOL TARTRATE 50 MG PO TABS Oral Take 0.5 tablets (25 mg total) by mouth 2 (two) times daily. 90 tablet 3  . CENTRUM SILVER PO Oral Take 1 tablet by mouth daily.     . OMEGA 3 PO Oral Take 2 tablets by mouth daily.     Marland Kitchen OMEPRAZOLE 40 MG PO CPDR  TAKE 1 CAPSULE DAILY 90 capsule 2    BP 136/79  Pulse 122  Temp(Src) 99.2 F (37.3 C) (Oral)  Resp 20  SpO2 98%  Physical Exam  Nursing note and vitals reviewed. Constitutional: She appears well-developed and well-nourished. No distress.       Awake, alert, nontoxic appearance  HENT:  Head: Normocephalic and atraumatic.  Eyes: Conjunctivae are normal. Right eye exhibits no discharge. Left eye exhibits no discharge.  Neck: Normal range of motion. Neck supple.  Cardiovascular: Normal rate and regular rhythm.   Pulmonary/Chest:  Effort normal. She has no wheezes. She has no rales. She exhibits no tenderness.  Abdominal: She exhibits distension. There is tenderness in the right lower quadrant and left lower quadrant. There is rigidity and rebound. There is no guarding.  Musculoskeletal: She exhibits no tenderness.       Baseline ROM, no obvious new focal weakness  Neurological:       Mental status and motor strength appears baseline for patient and situation  Skin: No rash noted.  Psychiatric: She has a normal mood and affect.    ED Course  Procedures (including critical care time)  Labs Reviewed - No data to display Ct Abdomen Pelvis W Contrast  11/02/2011  *RADIOLOGY REPORT*  Clinical Data: Fever.  Abdominal pain with guarding in the right lower quadrant.  CT ABDOMEN AND PELVIS WITH CONTRAST  Technique:  Multidetector CT imaging of the abdomen and pelvis was performed following the standard protocol during bolus administration of intravenous contrast.   Contrast: 164mL OMNIPAQUE IOHEXOL 300 MG/ML IV SOLN  Comparison: None.  Findings: The patient has perforated focal sigmoid diverticulitis with two small abscesses in the pelvis, one being 2.5 cm in diameter and the other being 17 mm in diameter. There is a small amount of air in one of the abscesses.  There is a single small bubble of air just to the left of the larger abscess.  There is a focal sigmoid phlegmon measuring approximately 4.7 cm in length.  The remainder of the bowel demonstrates no acute abnormality. Appendix and terminal ileum are normal.  Diffuse mild hepatic steatosis.  Previous cholecystectomy.  Spleen, pancreas, adrenal glands, and kidneys are normal.  Uterus has been removed.  Ovaries are normal.  No significant osseous abnormality.  IMPRESSION: Perforated sigmoid diverticulitis with two small adjacent abscesses and a 4.7 cm sigmoid phlegmon at the site of the diverticular disease.  Original Report Authenticated By: Larey Seat, M.D.     IMP:  Perforated diverticulitis.    MDM  Pt was sent to be admitted due to perforated sigmoid diverticulitis.  IV fluid and abx (cipro/flagyl) has been initiated.  Pt is currently in NAD.  Will call for admission.  I have discussed with my attending who agrees with assessment and plan.    7:55 PM Patient has been seen by Dr. Ninfa Linden, the surgeon, who will admit the patient for further management.  Medical screening examination/treatment/procedure(s) were conducted as a shared visit with non-physician practitioner(s) and myself.  I personally evaluated the patient during the encounter 70 year old woman, usually in good health, with 2 day Hx lower abdominal pain.  Exam shows LLL and suprapubic tenderness.  CT of abdomen and pelvis as outpatient showed perforated diverticulitis.  Pt admitted by Coralie Keens, M.D. General Surgeon.   Katy Apo, M.D.       Domenic Moras, PA 11/02/11 [place] [facility], MD 11/03/11 [phone]

## 2011-11-02 NOTE — ED Notes (Signed)
hospitalist to [phone] paged

## 2011-11-03 LAB — BASIC METABOLIC PANEL
BUN: 7 mg/dL (ref 6–23)
Calcium: 8.8 mg/dL (ref 8.4–10.5)
Chloride: 103 mEq/L (ref 96–112)
Creatinine, Ser: 0.73 mg/dL (ref 0.50–1.10)
GFR calc Af Amer: >90 mL/min (ref >90)
GFR calc Af Amer: >90 mL/min (ref >90)
GFR calc non Af Amer: 84 mL/min — ABNORMAL LOW (ref >90)
GFR calc non Af Amer: 85 mL/min — ABNORMAL LOW (ref >90)
Potassium: 4.2 mEq/L (ref 3.5–5.1)
Sodium: 136 mEq/L (ref 135–145)

## 2011-11-03 LAB — CBC
HCT: 31.5 % — ABNORMAL LOW (ref 36.0–46.0)
HCT: 33.1 % — ABNORMAL LOW (ref 36.0–46.0)
Hemoglobin: 10.3 g/dL — ABNORMAL LOW (ref 12.0–15.0)
Hemoglobin: 11.1 g/dL — ABNORMAL LOW (ref 12.0–15.0)
MCH: 30.2 pg (ref 26.0–34.0)
MCH: 31.1 pg (ref 26.0–34.0)
MCHC: 32.7 g/dL (ref 30.0–36.0)
MCHC: 32.9 g/dL (ref 30.0–36.0)
MCHC: 33.3 g/dL (ref 30.0–36.0)
MCV: 92.4 fL (ref 78.0–100.0)
Platelets: 277 10*3/uL (ref 150–400)
RBC: 3.41 MIL/uL — ABNORMAL LOW (ref 3.87–5.11)
RBC: 3.57 MIL/uL — ABNORMAL LOW (ref 3.87–5.11)
RDW: 12.3 % (ref 11.5–15.5)
RDW: 12.1 % (ref 11.5–15.5)

## 2011-11-03 LAB — DIFFERENTIAL
Basophils Absolute: 0 10*3/uL (ref 0.0–0.1)
Eosinophils Absolute: 0.2 10*3/uL (ref 0.0–0.7)
Eosinophils Relative: 1 % (ref 0–5)
Lymphocytes Relative: 18 % (ref 12–46)
Monocytes Absolute: 1.1 10*3/uL — ABNORMAL HIGH (ref 0.1–1.0)

## 2011-11-03 MED ORDER — BIOTENE DRY MOUTH MT LIQD
15.0000 mL | Freq: Two times a day (BID) | OROMUCOSAL | Status: DC
  Administered 2011-11-04 – 2011-11-07 (×7): 15 mL via OROMUCOSAL

## 2011-11-03 MED ORDER — POTASSIUM CHLORIDE IN NACL 20-0.9 MEQ/L-% IV SOLN
INTRAVENOUS | Status: DC
  Administered 2011-11-04 – 2011-11-07 (×6): via INTRAVENOUS
  Filled 2011-11-03 (×10): qty 1000

## 2011-11-03 MED ORDER — CHLORHEXIDINE GLUCONATE 0.12 % MT SOLN
15.0000 mL | Freq: Two times a day (BID) | OROMUCOSAL | Status: DC
  Administered 2011-11-03 – 2011-11-07 (×8): 15 mL via OROMUCOSAL
  Filled 2011-11-03 (×9): qty 15

## 2011-11-03 MED ORDER — ACETAMINOPHEN 325 MG PO TABS
650.0000 mg | ORAL_TABLET | Freq: Four times a day (QID) | ORAL | Status: DC | PRN
  Administered 2011-11-03 – 2011-11-04 (×2): 650 mg via ORAL
  Filled 2011-11-03: qty 2

## 2011-11-03 MED ORDER — PNEUMOCOCCAL VAC POLYVALENT 25 MCG/0.5ML IJ INJ
0.5000 mL | INJECTION | INTRAMUSCULAR | Status: AC
  Administered 2011-11-04: 0.5 mL via INTRAMUSCULAR
  Filled 2011-11-03: qty 0.5

## 2011-11-03 MED ORDER — HEPARIN SODIUM (PORCINE) 5000 UNIT/ML IJ SOLN
5000.0000 [IU] | Freq: Three times a day (TID) | INTRAMUSCULAR | Status: DC
  Administered 2011-11-03 – 2011-11-06 (×8): 5000 [IU] via SUBCUTANEOUS
  Filled 2011-11-03 (×11): qty 1

## 2011-11-03 NOTE — Progress Notes (Signed)
Subjective: Less abd pain. No n/v. +flatus. Sore in lower abdomen  Objective: Vital signs in last 24 hours: Temp:  [98.3 F (36.8 C)-99.5 F (37.5 C)] 98.9 F (37.2 C) (12/30 1200) Pulse Rate:  [92-122] 92  (12/30 1200) Resp:  [17-31] 18  (12/30 1200) BP: (109-136)/(23-79) 112/53 mmHg (12/30 1200) SpO2:  [92 %-98 %] 96 % (12/30 1200) Weight:  [150 lb 12.7 oz (68.4 kg)] 150 lb 12.7 oz (68.4 kg) (12/29 2100)    Intake/Output from previous day: 12/29 0701 - 12/30 0700 In: 1507.5 [I.V.:1407.5; IV Piggyback:100] Out: 800 [Urine:800] Intake/Output this shift: Total I/O In: 1052.5 [I.V.:752.5; IV Piggyback:300] Out: -   Alert, smiling/laughing cta Reg Soft, a little obese. +bs. Tender to deep palpation in b/l lower abd. No RT/guarding  Lab Results:   Basename 11/03/11 0700 11/03/11 0455  WBC 12.4* 12.2*  HGB 10.9* 10.3*  HCT 33.1* 31.5*  PLT 259 249   BMET  Basename 11/03/11 0700 11/02/11 2010 11/02/11 1240  NA 137 136 --  K 4.1 3.8 --  CL 103 101 --  CO2 25 -- 27  GLUCOSE 105* 94 --  BUN 7 8 --  CREATININE 0.73 0.80 --  CALCIUM 8.8 -- 9.8   PT/INR No results found for this basename: LABPROT:2,INR:2 in the last 72 hours ABG No results found for this basename: PHART:2,PCO2:2,PO2:2,HCO3:2 in the last 72 hours  Studies/Results: Ct Abdomen Pelvis W Contrast  11/02/2011  *RADIOLOGY REPORT*  Clinical Data: Fever.  Abdominal pain with guarding in the right lower quadrant.  CT ABDOMEN AND PELVIS WITH CONTRAST  Technique:  Multidetector CT imaging of the abdomen and pelvis was performed following the standard protocol during bolus administration of intravenous contrast.  Contrast: 136mL OMNIPAQUE IOHEXOL 300 MG/ML IV SOLN  Comparison: None.  Findings: The patient has perforated focal sigmoid diverticulitis with two small abscesses in the pelvis, one being 2.5 cm in diameter and the other being 17 mm in diameter. There is a small amount of air in one of the abscesses.   There is a single small bubble of air just to the left of the larger abscess.  There is a focal sigmoid phlegmon measuring approximately 4.7 cm in length.  The remainder of the bowel demonstrates no acute abnormality. Appendix and terminal ileum are normal.  Diffuse mild hepatic steatosis.  Previous cholecystectomy.  Spleen, pancreas, adrenal glands, and kidneys are normal.  Uterus has been removed.  Ovaries are normal.  No significant osseous abnormality.  IMPRESSION: Perforated sigmoid diverticulitis with two small adjacent abscesses and a 4.7 cm sigmoid phlegmon at the site of the diverticular disease.  Original Report Authenticated By: Larey Seat, M.D.    Anti-infectives: Anti-infectives     Start     Dose/Rate Route Frequency Ordered Stop   11/02/11 2200   ciprofloxacin (CIPRO) IVPB 400 mg        400 mg 200 mL/hr over 60 Minutes Intravenous Every 12 hours 11/02/11 2013     11/02/11 2015   metroNIDAZOLE (FLAGYL) IVPB 500 mg        500 mg 100 mL/hr over 60 Minutes Intravenous Every 8 hours 11/02/11 2013     11/02/11 1930   ciprofloxacin (CIPRO) IVPB 400 mg  Status:  Discontinued        400 mg 200 mL/hr over 60 Minutes Intravenous  Once 11/02/11 1921 11/02/11 2013   11/02/11 1930   metroNIDAZOLE (FLAGYL) IVPB 500 mg  Status:  Discontinued  500 mg 100 mL/hr over 60 Minutes Intravenous  Once 11/02/11 1921 11/02/11 2013          Assessment/Plan: Microperforated sigmoid diverticulitis with phlegmon  Cont bowel rest, IVF, IV abx.  Transfer to floor Repeat labs in am Will prob need repeat ct mid-week to evaluate fluid collections   Leighton Ruff. Redmond Pulling, MD, FACS General, Bariatric, & Minimally Invasive Surgery [facility], [place]    LOS: 1 day    Gayland Curry 11/03/2011

## 2011-11-04 LAB — DIFFERENTIAL
Basophils Relative: 0 % (ref 0–1)
Eosinophils Absolute: 0.2 10*3/uL (ref 0.0–0.7)
Lymphs Abs: 2.1 10*3/uL (ref 0.7–4.0)
Neutrophils Relative %: 68 % (ref 43–77)

## 2011-11-04 LAB — BASIC METABOLIC PANEL
GFR calc Af Amer: >90 mL/min (ref >90)
GFR calc non Af Amer: 87 mL/min — ABNORMAL LOW (ref >90)
Potassium: 4 mEq/L (ref 3.5–5.1)
Sodium: 136 mEq/L (ref 135–145)

## 2011-11-04 LAB — CBC
MCH: 30.6 pg (ref 26.0–34.0)
Platelets: 280 10*3/uL (ref 150–400)
RBC: 3.46 MIL/uL — ABNORMAL LOW (ref 3.87–5.11)

## 2011-11-04 LAB — MAGNESIUM: Magnesium: 2.2 mg/dL (ref 1.5–2.5)

## 2011-11-04 NOTE — Progress Notes (Addendum)
Subjective: Feeling better. Not much pain. Voiding without problems. Has had 2 small bowel movements. No nausea.  No fever. WBC down to 10,100.  Objective: Vital signs in last 24 hours: Temp:  [97.9 F (36.6 C)-99.5 F (37.5 C)] 98.3 F (36.8 C) (12/31 0500) Pulse Rate:  [92-109] 93  (12/31 0500) Resp:  [18-26] 18  (12/31 0500) BP: (112-134)/(53-72) 121/60 mmHg (12/31 0500) SpO2:  [95 %-97 %] 97 % (12/31 0500) Last BM Date: 11/04/11  Intake/Output from previous day: 12/30 0701 - 12/31 0700 In: 3529 [I.V.:3229; IV Piggyback:300] Out: 300 [Urine:300] Intake/Output this shift:    General appearance: no distress Resp: clear to auscultation bilaterally GI: abdomen is soft. Minimally tender left lower quadrant. Not distended. No mass. No guarding.  Lab Results:   [facility] 11/04/11 0655 11/03/11 2136  WBC 10.1 12.0*  HGB 10.6* 11.1*  HCT 32.1* 33.3*  PLT 280 277   BMET  Basename 11/03/11 2136 11/03/11 0700  NA 136 137  K 4.2 4.1  CL 103 103  CO2 22 25  GLUCOSE 93 105*  BUN 8 7  CREATININE 0.71 0.73  CALCIUM 8.8 8.8   PT/INR No results found for this basename: LABPROT:2,INR:2 in the last 72 hours ABG No results found for this basename: PHART:2,PCO2:2,PO2:2,HCO3:2 in the last 72 hours  Studies/Results: Ct Abdomen Pelvis W Contrast  11/02/2011  *RADIOLOGY REPORT*  Clinical Data: Fever.  Abdominal pain with guarding in the right lower quadrant.  CT ABDOMEN AND PELVIS WITH CONTRAST  Technique:  Multidetector CT imaging of the abdomen and pelvis was performed following the standard protocol during bolus administration of intravenous contrast.  Contrast: 167mL OMNIPAQUE IOHEXOL 300 MG/ML IV SOLN  Comparison: None.  Findings: The patient has perforated focal sigmoid diverticulitis with two small abscesses in the pelvis, one being 2.5 cm in diameter and the other being 17 mm in diameter. There is a small amount of air in one of the abscesses.  There is a single small  bubble of air just to the left of the larger abscess.  There is a focal sigmoid phlegmon measuring approximately 4.7 cm in length.  The remainder of the bowel demonstrates no acute abnormality. Appendix and terminal ileum are normal.  Diffuse mild hepatic steatosis.  Previous cholecystectomy.  Spleen, pancreas, adrenal glands, and kidneys are normal.  Uterus has been removed.  Ovaries are normal.  No significant osseous abnormality.  IMPRESSION: Perforated sigmoid diverticulitis with two small adjacent abscesses and a 4.7 cm sigmoid phlegmon at the site of the diverticular disease.  Original Report Authenticated By: Larey Seat, M.D.    Anti-infectives: Anti-infectives     Start     Dose/Rate Route Frequency Ordered Stop   11/02/11 2200   ciprofloxacin (CIPRO) IVPB 400 mg        400 mg 200 mL/hr over 60 Minutes Intravenous Every 12 hours 11/02/11 2013     11/02/11 2015   metroNIDAZOLE (FLAGYL) IVPB 500 mg        500 mg 100 mL/hr over 60 Minutes Intravenous Every 8 hours 11/02/11 2013     11/02/11 1930   ciprofloxacin (CIPRO) IVPB 400 mg  Status:  Discontinued        400 mg 200 mL/hr over 60 Minutes Intravenous  Once 11/02/11 1921 11/02/11 2013   11/02/11 1930   metroNIDAZOLE (FLAGYL) IVPB 500 mg  Status:  Discontinued        500 mg 100 mL/hr over 60 Minutes Intravenous  Once 11/02/11 1921 11/02/11  2013          Assessment/Plan: Acute sigmoid diverticulitis with microperforation, phlegmon, and microabscesses.  Clinically she is improving  Begin clear liquid diet.  Continue IV antibiotics.  Repeat CT midweek to re-evaluate fluid collections.  Subcutaneous heparin for DVT prophylaxis  Hypertension. Well-controlled.  History supraventricular tachycardia. On prophylactic beta blockers.    LOS: 2 days    Evyn Putzier M 11/04/2011

## 2011-11-05 NOTE — Progress Notes (Signed)
Patient ID: Leslie Bryant, female   DOB: 1941-09-25, 71 y.o.   MRN: [id number]    Subjective: Pt feels better.  No pain except slight gas pain with eating.  Tolerating clear liquids.  Objective: Vital signs in last 24 hours: Temp:  [98.3 F (36.8 C)-98.8 F (37.1 C)] 98.6 F (37 C) (01/01 0503) Pulse Rate:  [91-99] 92  (01/01 0503) Resp:  [18] 18  (01/01 0503) BP: (134-141)/(70-84) 137/84 mmHg (01/01 0503) SpO2:  [96 %-99 %] 99 % (01/01 0503) Last BM Date: 11/04/11  Intake/Output from previous day: 12/31 0701 - 01/01 0700 In: 3058.5 [I.V.:3058.5] Out: 3200 [Urine:3200] Intake/Output this shift:    PE: Abd: soft, essentially NT, ND, +BS Ht: regular Lungs: CTAB  Lab Results:   [facility] 11/04/11 0655 11/03/11 2136  WBC 10.1 12.0*  HGB 10.6* 11.1*  HCT 32.1* 33.3*  PLT 280 277   BMET  Basename 11/04/11 0655 11/03/11 2136  NA 136 136  K 4.0 4.2  CL 105 103  CO2 21 22  GLUCOSE 82 93  BUN 8 8  CREATININE 0.65 0.71  CALCIUM 8.7 8.8   PT/INR No results found for this basename: LABPROT:2,INR:2 in the last 72 hours   Studies/Results: No results found.  Anti-infectives: Anti-infectives     Start     Dose/Rate Route Frequency Ordered Stop   11/02/11 2200   ciprofloxacin (CIPRO) IVPB 400 mg        400 mg 200 mL/hr over 60 Minutes Intravenous Every 12 hours 11/02/11 2013     11/02/11 2015   metroNIDAZOLE (FLAGYL) IVPB 500 mg        500 mg 100 mL/hr over 60 Minutes Intravenous Every 8 hours 11/02/11 2013     11/02/11 1930   ciprofloxacin (CIPRO) IVPB 400 mg  Status:  Discontinued        400 mg 200 mL/hr over 60 Minutes Intravenous  Once 11/02/11 1921 11/02/11 2013   11/02/11 1930   metroNIDAZOLE (FLAGYL) IVPB 500 mg  Status:  Discontinued        500 mg 100 mL/hr over 60 Minutes Intravenous  Once 11/02/11 1921 11/02/11 2013           Assessment/Plan  1. Perforated diverticulitis with phlegmon  Plan: 1. Adv diet to full liquids 2. Repeat CT scan in the  am to follow phlegmon. 3. Nutrition teaching for diverticulosis diet.   LOS: 3 days    Marysa Wessner E 11/05/2011

## 2011-11-05 NOTE — Progress Notes (Signed)
As per note of KO,PA. She is feeling better with less pain and a benign abdominal exam. Diet advance today and CT tomorrow

## 2011-11-06 ENCOUNTER — Inpatient Hospital Stay (HOSPITAL_COMMUNITY): Payer: Medicare Other

## 2011-11-06 MED ORDER — IOHEXOL 300 MG/ML  SOLN
80.0000 mL | Freq: Once | INTRAMUSCULAR | Status: AC | PRN
  Administered 2011-11-06: 80 mL via INTRAVENOUS

## 2011-11-06 MED ORDER — HEPARIN SODIUM (PORCINE) 5000 UNIT/ML IJ SOLN
5000.0000 [IU] | Freq: Three times a day (TID) | INTRAMUSCULAR | Status: AC
  Administered 2011-11-06 (×2): 5000 [IU] via SUBCUTANEOUS
  Filled 2011-11-06: qty 1

## 2011-11-06 NOTE — Progress Notes (Signed)
Pt refused IV restart.  Discussed that IV sites are rotated after 4 days.  No complaints of pain at site, IV fluids currently infusing.

## 2011-11-06 NOTE — Plan of Care (Signed)
Problem: Food- and Nutrition-Related Knowledge Deficit (NB-1.1) Goal: Nutrition education Formal process to instruct or train a patient/client in a skill or to impart knowledge to help patients/clients voluntarily manage or modify food choices and eating behavior to maintain or improve health.  Outcome: Completed/Met Date Met:  11/06/11 RD consult for diverticulosis diet education. Handouts provided from [employer]. Currently on a Low Fiber diet. No nutrition problems identified PTA. No further nutrition intervention at this time.  Leslie Bryant Pager # [phone]

## 2011-11-06 NOTE — Progress Notes (Signed)
UR of chart completed.  

## 2011-11-06 NOTE — H&P (Signed)
Leslie Bryant is an 71 y.o. female.   Chief Complaint: pelvic pain;  improved since admission.  HPI: possible perforated diverticular abscess.   Past Medical History  Diagnosis Date  . Palpitations   . Supraventricular tachycardia   . Hypercholesterolemia   . Broken humerus     Right  . Labile hypertension   . GERD (gastroesophageal reflux disease)   . Chest pain, atypical   . Depression   . Gall bladder stones   . Ulcer     History of ulcer  . Headache   . Anxiety   . Arthritis     Past Surgical History  Procedure Date  . Mastectomy 1986    Right  . Cholecystectomy   . Rt knee surgery 04/2-12  . Breast surgery   . Abdominal hysterectomy     Family History  Problem Relation Age of Onset  . Heart attack Father   . Heart failure Sister    Social History:  reports that she has never smoked. She does not have any smokeless tobacco history on file. She reports that she drinks about .5 ounces of alcohol per week. Her drug history not on file.  Allergies:  Allergies  Allergen Reactions  . Crestor (Rosuvastatin Calcium) Other (See Comments)    Shoulder pain  . Lipitor (Atorvastatin Calcium) Other (See Comments)    Myalgias   . Penicillins   . Pravastatin Other (See Comments)    myalgias    Medications Prior to Admission  Medication Dose Route Frequency Provider Last Rate Last Dose  . 0.9 % NaCl with KCl 20 mEq/ L  infusion   Intravenous Continuous Henreitta Cea, PA 50 mL/hr at 11/05/11 1232    . acetaminophen (TYLENOL) tablet 650 mg  650 mg Oral Q6H PRN Gayland Curry, MD   650 mg at 11/04/11 0401  . antiseptic oral rinse (BIOTENE) solution 15 mL  15 mL Mouth Rinse q12n4p Gayland Curry, MD   15 mL at 11/06/11 1200  . chlorhexidine (PERIDEX) 0.12 % solution 15 mL  15 mL Mouth Rinse BID Gayland Curry, MD   15 mL at 11/06/11 0743  . ciprofloxacin (CIPRO) IVPB 400 mg  400 mg Intravenous Q12H Harl Bowie, MD   400 mg at 11/06/11 1032  . fentaNYL (SUBLIMAZE)  injection 25 mcg  25 mcg Intravenous Q1H PRN Harl Bowie, MD   25 mcg at 11/03/11 1043  . heparin injection 5,000 Units  5,000 Units Subcutaneous Q8H Henreitta Cea, PA   5,000 Units at 11/06/11 1443  . iohexol (OMNIPAQUE) 300 MG/ML solution 100 mL  100 mL Intravenous Once PRN Medication Radiologist   100 mL at 11/02/11 1411  . iohexol (OMNIPAQUE) 300 MG/ML solution 80 mL  80 mL Intravenous Once PRN Medication Radiologist   80 mL at 11/06/11 0956  . metoprolol tartrate (LOPRESSOR) tablet 25 mg  25 mg Oral BID Harl Bowie, MD   25 mg at 11/06/11 1032  . metroNIDAZOLE (FLAGYL) IVPB 500 mg  500 mg Intravenous Q8H Harl Bowie, MD   500 mg at 11/06/11 1200  . ondansetron (ZOFRAN) injection 4 mg  4 mg Intravenous Q6H PRN Harl Bowie, MD      . pantoprazole (PROTONIX) injection 40 mg  40 mg Intravenous QHS Harl Bowie, MD   40 mg at 11/05/11 2114  . pneumococcal 23 valent vaccine (PNU-IMMUNE) injection 0.5 mL  0.5 mL Intramuscular Tomorrow-1000 [name], MD   0.5  mL at 11/04/11 1042  . DISCONTD: 0.9 % NaCl with KCl 20 mEq/ L  infusion   Intravenous Continuous Harl Bowie, MD 150 mL/hr at 11/03/11 1900    . DISCONTD: ciprofloxacin (CIPRO) IVPB 400 mg  400 mg Intravenous Once Domenic Moras, PA      . DISCONTD: heparin injection 5,000 Units  5,000 Units Subcutaneous Q8H Gayland Curry, MD   5,000 Units at 11/06/11 0600  . DISCONTD: metoprolol tartrate (LOPRESSOR) 25 MG tablet           . DISCONTD: metroNIDAZOLE (FLAGYL) IVPB 500 mg  500 mg Intravenous Once Domenic Moras, PA   500 mg at 11/02/11 1954  . DISCONTD: sodium chloride 0.9 % bolus 500 mL  500 mL Intravenous Once Domenic Moras, PA       Medications Prior to Admission  Medication Sig Dispense Refill  . Calcium Carbonate-Vitamin D (CALCIUM + D PO) Take 1,200 mg by mouth.        . co-enzyme Q-10 30 MG capsule Take 30 mg by mouth 2 (two) times daily.       Marland Kitchen ezetimibe (ZETIA) 10 MG tablet Take 1 tablet (10 mg total)  by mouth daily.  90 tablet  3  . metoprolol (LOPRESSOR) 50 MG tablet Take 0.5 tablets (25 mg total) by mouth 2 (two) times daily.  90 tablet  3  . Multiple Vitamins-Minerals (CENTRUM SILVER PO) Take 1 tablet by mouth daily.       . Omega-3 Fatty Acids (OMEGA 3 PO) Take 2 tablets by mouth daily.       Marland Kitchen omeprazole (PRILOSEC) 40 MG capsule TAKE 1 CAPSULE DAILY  90 capsule  2    No results found for this or any previous visit (from the past 48 hour(s)). Ct Abdomen Pelvis W Contrast  11/06/2011  *RADIOLOGY REPORT*  Clinical Data: Followup diverticulitis.  CT ABDOMEN AND PELVIS WITH CONTRAST  Technique:  Multidetector CT imaging of the abdomen and pelvis was performed following the standard protocol during bolus administration of intravenous contrast.  Contrast:  80 ml Omnipaque-300.  Comparison: 11/02/2011.  Findings: Diffuse inflammation surrounding the rectosigmoid colon with abscess in the mesentery measuring 3.3 x 1.9 x 4 cm versus prior 2.5 x 1.7 x 3.3 cm.  This is suggestive of sigmoid diverticulitis with perforation and subsequent abscess.  Follow-up until clearance recommended to evaluate underlying colon and exclude malignancy.  Scattered normal sized pelvic lymph nodes slightly asymmetric greater on the left.  Post hysterectomy.  Decompressed urinary bladder.  Fatty infiltration of the liver without focal hepatic lesion.  Post cholecystectomy.  No focal splenic, pancreatic, adrenal or renal mass.  Mitral valve calcifications.  Atherosclerotic type changes of the abdominal aorta with mild narrowing of the celiac artery origin.  Scarring lung bases.  Right breast prosthesis is in place.  No bony destructive lesion.  Degenerative changes lower lumbar spine.  Mild hip joint degenerative changes.  Top normal sized interaortocaval lymph node.  IMPRESSION: Findings consistent with perforated sigmoid diverticulitis with adjacent abscess which has increased minimally in size when compared to the prior exam as  discussed above.  Original Report Authenticated By: Doug Sou, M.D.    ROS : no new changes since seen by surgical team this am.   Blood pressure 102/82, pulse 100, temperature 97.9 F (36.6 C), temperature source Oral, resp. rate 19, height 5\' 2"  (1.575 m), weight 150 lb 12.7 oz (68.4 kg), SpO2 99.00%. Physical Exam : A&O x3, soft abdomen, tender  along pelvis, lungs CTA and CV with RRR.   Assessment/Plan Pelvic collection worrisome for abscess - pt for aspiration or drainage under CT guidance. Procedure details, risks and benefits discussed with patient with her apparent understanding.  Written consent obtained.  Plan for aspiration in AM.   CAMPBELL,PAMELA D 11/06/2011, 3:42 PM

## 2011-11-06 NOTE — Progress Notes (Signed)
Patient ID: Leslie Bryant, female   DOB: Dec 02, 1940, 71 y.o.   MRN: [id number]    Subjective: Pt feels great.  No pain. Tolerating full liquids.  Objective: Vital signs in last 24 hours: Temp:  [98.4 F (36.9 C)-99 F (37.2 C)] 98.9 F (37.2 C) (01/02 0557) Pulse Rate:  [91-95] 93  (01/02 0557) Resp:  [18] 18  (01/02 0557) BP: (128-132)/(66-73) 132/73 mmHg (01/02 0557) SpO2:  [97 %-99 %] 97 % (01/02 0557) Last BM Date: 11/05/11  Intake/Output from previous day: 01/01 0701 - 01/02 0700 In: 3491.4 [I.V.:1691.4; IV Piggyback:1800] Out: 1200 [Urine:1200] Intake/Output this shift:    PE: Abd: soft, NT, ND, +BS  Lab Results:   [facility] 11/04/11 0655 11/03/11 2136  WBC 10.1 12.0*  HGB 10.6* 11.1*  HCT 32.1* 33.3*  PLT 280 277   BMET  Basename 11/04/11 0655 11/03/11 2136  NA 136 136  K 4.0 4.2  CL 105 103  CO2 21 22  GLUCOSE 82 93  BUN 8 8  CREATININE 0.65 0.71  CALCIUM 8.7 8.8   PT/INR No results found for this basename: LABPROT:2,INR:2 in the last 72 hours   Studies/Results: No results found.  Anti-infectives: Anti-infectives     Start     Dose/Rate Route Frequency Ordered Stop   11/02/11 2200   ciprofloxacin (CIPRO) IVPB 400 mg        400 mg 200 mL/hr over 60 Minutes Intravenous Every 12 hours 11/02/11 2013     11/02/11 2015   metroNIDAZOLE (FLAGYL) IVPB 500 mg        500 mg 100 mL/hr over 60 Minutes Intravenous Every 8 hours 11/02/11 2013     11/02/11 1930   ciprofloxacin (CIPRO) IVPB 400 mg  Status:  Discontinued        400 mg 200 mL/hr over 60 Minutes Intravenous  Once 11/02/11 1921 11/02/11 2013   11/02/11 1930   metroNIDAZOLE (FLAGYL) IVPB 500 mg  Status:  Discontinued        500 mg 100 mL/hr over 60 Minutes Intravenous  Once 11/02/11 1921 11/02/11 2013           Assessment/Plan  1. Diverticulitis with phlegmon  Plan: 1. Adv to low fiber diet 2. Await CT results, if phlegmon resolved will switch to po abx, if larger and an abscess will  cont IV abx and look at evaluation for perc drain. 3. Anticipate d/c in am if CT improved.   LOS: 4 days    OSBORNE,KELLY E 11/06/2011 CT shows slight increase in abscess.  Will request review for percutaneous drain.

## 2011-11-07 ENCOUNTER — Inpatient Hospital Stay (HOSPITAL_COMMUNITY): Payer: Medicare Other

## 2011-11-07 LAB — PROTIME-INR: INR: 1.03 (ref 0.00–1.49)

## 2011-11-07 MED ORDER — FENTANYL CITRATE 0.05 MG/ML IJ SOLN
INTRAMUSCULAR | Status: AC | PRN
  Administered 2011-11-07: 50 ug via INTRAVENOUS
  Administered 2011-11-07 (×2): 25 ug via INTRAVENOUS

## 2011-11-07 MED ORDER — MIDAZOLAM HCL 2 MG/2ML IJ SOLN
INTRAMUSCULAR | Status: AC
  Filled 2011-11-07: qty 4

## 2011-11-07 MED ORDER — MIDAZOLAM HCL 5 MG/5ML IJ SOLN
INTRAMUSCULAR | Status: AC | PRN
  Administered 2011-11-07 (×3): 1 mg via INTRAVENOUS

## 2011-11-07 MED ORDER — FENTANYL CITRATE 0.05 MG/ML IJ SOLN
INTRAMUSCULAR | Status: AC
  Filled 2011-11-07: qty 4

## 2011-11-07 NOTE — ED Notes (Addendum)
Carissa RN tranported patient with help of transporter to 5100

## 2011-11-07 NOTE — ED Notes (Signed)
O2 d/c 

## 2011-11-07 NOTE — ED Notes (Signed)
Report to [facility]

## 2011-11-07 NOTE — Procedures (Signed)
Placement of 10 fr drain in small pelvic assess.  Removed 5 ml of yellow purulent fluid.

## 2011-11-07 NOTE — Progress Notes (Signed)
  Subjective: Pt ok. Feeling good. Good flatus, no real BM yet.  Objective: Vital signs in last 24 hours: Temp:  [97.9 F (36.6 C)-98.9 F (37.2 C)] 98.9 F (37.2 C) (01/03 0522) Pulse Rate:  [97-106] 97  (01/03 0522) Resp:  [16-19] 18  (01/03 0522) BP: (102-135)/(60-82) 117/60 mmHg (01/03 0522) SpO2:  [97 %-99 %] 97 % (01/03 0522) Last BM Date: 11/06/11  Intake/Output this shift:    Physical Exam: BP 117/60  Pulse 97  Temp(Src) 98.9 F (37.2 C) (Oral)  Resp 18  Ht 5\' 2"  (1.575 m)  Wt 150 lb 12.7 oz (68.4 kg)  BMI 27.58 kg/m2  SpO2 97% Abdomen: soft, ND  Labs: CBC No results found for this basename: WBC:2,HGB:2,HCT:2,PLT:2 in the last 72 hours BMET No results found for this basename: NA:2,K:2,CL:2,CO2:2,GLUCOSE:2,BUN:2,CREATININE:2,CALCIUM:2 in the last 72 hours LFT No results found for this basename: PROT,ALBUMIN,AST,ALT,ALKPHOS,BILITOT,BILIDIR,IBILI,LIPASE in the last 72 hours PT/INR No results found for this basename: LABPROT:2,INR:2 in the last 72 hours ABG No results found for this basename: PHART:2,PCO2:2,PO2:2,HCO3:2 in the last 72 hours  Studies/Results: Ct Abdomen Pelvis W Contrast  11/06/2011  *RADIOLOGY REPORT*  Clinical Data: Followup diverticulitis.  CT ABDOMEN AND PELVIS WITH CONTRAST  Technique:  Multidetector CT imaging of the abdomen and pelvis was performed following the standard protocol during bolus administration of intravenous contrast.  Contrast:  80 ml Omnipaque-300.  Comparison: 11/02/2011.  Findings: Diffuse inflammation surrounding the rectosigmoid colon with abscess in the mesentery measuring 3.3 x 1.9 x 4 cm versus prior 2.5 x 1.7 x 3.3 cm.  This is suggestive of sigmoid diverticulitis with perforation and subsequent abscess.  Follow-up until clearance recommended to evaluate underlying colon and exclude malignancy.  Scattered normal sized pelvic lymph nodes slightly asymmetric greater on the left.  Post hysterectomy.  Decompressed urinary  bladder.  Fatty infiltration of the liver without focal hepatic lesion.  Post cholecystectomy.  No focal splenic, pancreatic, adrenal or renal mass.  Mitral valve calcifications.  Atherosclerotic type changes of the abdominal aorta with mild narrowing of the celiac artery origin.  Scarring lung bases.  Right breast prosthesis is in place.  No bony destructive lesion.  Degenerative changes lower lumbar spine.  Mild hip joint degenerative changes.  Top normal sized interaortocaval lymph node.  IMPRESSION: Findings consistent with perforated sigmoid diverticulitis with adjacent abscess which has increased minimally in size when compared to the prior exam as discussed above.  Original Report Authenticated By: Doug Sou, M.D.    Assessment: Active Problems:  Diverticulitis of large intestine with perforation Small Abscess    Plan: For IR aspiration today. Continue abx.  LOS: 5 days    Federico Flake 11/07/2011  Aspiration pending

## 2011-11-08 ENCOUNTER — Other Ambulatory Visit (INDEPENDENT_AMBULATORY_CARE_PROVIDER_SITE_OTHER): Payer: Self-pay | Admitting: General Surgery

## 2011-11-08 DIAGNOSIS — K651 Peritoneal abscess: Secondary | ICD-10-CM

## 2011-11-08 MED ORDER — METRONIDAZOLE 500 MG PO TABS: 500.0000 mg | ORAL_TABLET | Freq: Three times a day (TID) | ORAL | Status: AC

## 2011-11-08 MED ORDER — CIPROFLOXACIN HCL 500 MG PO TABS: 500.0000 mg | ORAL_TABLET | Freq: Two times a day (BID) | ORAL | Status: AC

## 2011-11-08 NOTE — Progress Notes (Signed)
Patient ID: Leslie Bryant, female   DOB: 16-Dec-1940, 71 y.o.   MRN: [id number]    Subjective: Pt feels well.  Some minor discomfort around drain site.  Otherwise ready to go home.  Objective: Vital signs in last 24 hours: Temp:  [98.1 F (36.7 C)-98.7 F (37.1 C)] 98.6 F (37 C) (01/04 0515) Pulse Rate:  [94-105] 97  (01/04 0515) Resp:  [13-20] 20  (01/04 0515) BP: (102-126)/(55-76) 125/72 mmHg (01/04 0515) SpO2:  [96 %-100 %] 99 % (01/04 0208) Last BM Date: 11/06/11  Intake/Output from previous day: 01/03 0701 - 01/04 0700 In: 1871 [I.V.:1871] Out: 2012 [Urine:2000; Drains:12] Intake/Output this shift:    PE: Abd: soft, NT, ND, +BS, JP with minimal serous output.  Lab Results:  No results found for this basename: WBC:2,HGB:2,HCT:2,PLT:2 in the last 72 hours BMET No results found for this basename: NA:2,K:2,CL:2,CO2:2,GLUCOSE:2,BUN:2,CREATININE:2,CALCIUM:2 in the last 72 hours PT/INR  Basename 11/07/11 0836  LABPROT 13.7  INR 1.03     Studies/Results: Ct Guided Abscess Drain  11/07/2011  *RADIOLOGY REPORT*  Clinical history:Diverticular abscess.  PROCEDURE(S): CT GUIDED ABSCESS DRAIN PLACEMENT  Physician: Stephan Minister. Henn, MD  Medications:Versed 3 mg, Fentanyl 100 mcg. A radiology nurse monitored the patient for moderate sedation.  Moderate sedation time:28 minutes  Fluoroscopy time: 4 seconds of CT fluoroscopy  Procedure:The procedure was explained to the patient.  The risks and benefits of the procedure were discussed and the patient's questions were addressed.  Informed consent was obtained from the patient.  The patient is placed prone on the CT scanner.  Images of the pelvis were obtained.  The small pelvic abscess was identified. The left gluteal region was prepped and draped in a sterile fashion.  Skin was anesthetized with lidocaine.  21 gauge needle was directed towards the small abscess collection.  CT fluoroscopy was used to place the needle directly within the lesion.   Amplatz wire was placed and the tract was dilated to 10-French.  10-French Bettey Mare drainage catheter was placed.  5 ml of yellow purulent fluid was aspirated.  Sample sent for Gram stain and culture. Catheter sutured to the skin and attached to a suction bulb.  Findings:2.3 cm air-fluid collection in the pelvis.  10-French drain was placed in the collection and 5 ml of purulent fluid was removed.  Complications: None  Impression:CT guided placement of a drainage catheter in the small pelvic abscess.  Original Report Authenticated By: Markus Daft, M.D.   Ct Abdomen Pelvis W Contrast  11/06/2011  *RADIOLOGY REPORT*  Clinical Data: Followup diverticulitis.  CT ABDOMEN AND PELVIS WITH CONTRAST  Technique:  Multidetector CT imaging of the abdomen and pelvis was performed following the standard protocol during bolus administration of intravenous contrast.  Contrast:  80 ml Omnipaque-300.  Comparison: 11/02/2011.  Findings: Diffuse inflammation surrounding the rectosigmoid colon with abscess in the mesentery measuring 3.3 x 1.9 x 4 cm versus prior 2.5 x 1.7 x 3.3 cm.  This is suggestive of sigmoid diverticulitis with perforation and subsequent abscess.  Follow-up until clearance recommended to evaluate underlying colon and exclude malignancy.  Scattered normal sized pelvic lymph nodes slightly asymmetric greater on the left.  Post hysterectomy.  Decompressed urinary bladder.  Fatty infiltration of the liver without focal hepatic lesion.  Post cholecystectomy.  No focal splenic, pancreatic, adrenal or renal mass.  Mitral valve calcifications.  Atherosclerotic type changes of the abdominal aorta with mild narrowing of the celiac artery origin.  Scarring lung bases.  Right breast prosthesis  is in place.  No bony destructive lesion.  Degenerative changes lower lumbar spine.  Mild hip joint degenerative changes.  Top normal sized interaortocaval lymph node.  IMPRESSION: Findings consistent with perforated sigmoid  diverticulitis with adjacent abscess which has increased minimally in size when compared to the prior exam as discussed above.  Original Report Authenticated By: Doug Sou, M.D.    Anti-infectives: Anti-infectives     Start     Dose/Rate Route Frequency Ordered Stop   11/02/11 2200   ciprofloxacin (CIPRO) IVPB 400 mg        400 mg 200 mL/hr over 60 Minutes Intravenous Every 12 hours 11/02/11 2013     11/02/11 2015   metroNIDAZOLE (FLAGYL) IVPB 500 mg        500 mg 100 mL/hr over 60 Minutes Intravenous Every 8 hours 11/02/11 2013     11/02/11 1930   ciprofloxacin (CIPRO) IVPB 400 mg  Status:  Discontinued        400 mg 200 mL/hr over 60 Minutes Intravenous  Once 11/02/11 1921 11/02/11 2013   11/02/11 1930   metroNIDAZOLE (FLAGYL) IVPB 500 mg  Status:  Discontinued        500 mg 100 mL/hr over 60 Minutes Intravenous  Once 11/02/11 1921 11/02/11 2013           Assessment/Plan  1. Tics with microperf and abscess, s/p perc drain  Plan: 1. Ok to d/c home with [facility] for drain flushes 2. Follow up CT scan in 1 week 3. Follow-up in our office after CT scan with Dr. Hassell Done. 4. 2 weeks of oral abx.   LOS: 6 days    Meeka Cartelli E 11/08/2011

## 2011-11-08 NOTE — Progress Notes (Addendum)
Discharge instructions reviewed with pt and prescriptions given.  Pt verbalized understanding and had no questions.  Home health arranged by CM.  Pt discharged in stable condition via wheelchair with husband.  Eliezer Bottom [place]

## 2011-11-08 NOTE — Progress Notes (Signed)
   CARE MANAGEMENT NOTE 11/08/2011  Patient:  HEART, SCHREITER   Account Number:  [id number]  Date Initiated:  11/06/2011  Documentation initiated by:  Sandi Mariscal  Subjective/Objective Assessment:   abd pain, abscesses that will now require drains     Action/Plan:   home with questionable need for HH--await outcome of drains.   Anticipated DC Date:  11/08/2011   Anticipated DC Plan:  [place]  CM consult      [facility] Choice  HOME HEALTH   Choice offered to / List presented to:  C-1 Patient        [place] arranged  HH-1 RN      [place].   Status of service:  Completed, signed off  Discharge Disposition:  [place]  Per UR Regulation:  Reviewed for med. necessity/level of care/duration of stay  Comments:  11/08/2011 Sandi Mariscal, RN BSN CCM [id number]--Confirmed home address and phone with pt for both her and her husband. Daughter is a Therapist, sports and wants a visit or two to make sure she is performing the flushes correctly.

## 2011-11-08 NOTE — Progress Notes (Signed)
Subjective: Diverticular abscess drain placed 1/3 Pt feeling better;up in room  Objective: Vital signs in last 24 hours: Temp:  [98.1 F (36.7 C)-98.7 F (37.1 C)] 98.6 F (37 C) (01/04 0515) Pulse Rate:  [94-105] 97  (01/04 0515) Resp:  [13-20] 20  (01/04 0515) BP: (102-126)/(55-76) 125/72 mmHg (01/04 0515) SpO2:  [96 %-100 %] 99 % (01/04 0208) Last BM Date: 11/06/11  Intake/Output from previous day: 01/03 0701 - 01/04 0700 In: 1871 [I.V.:1871] Out: 2012 [Urine:2000; Drains:12] Intake/Output this shift:    PE:  Drain intact; NT; clean and dry Afeb; VSS JP has small amt serous fluid in it now 12 cc overnight output   Lab Results:  No results found for this basename: WBC:2,HGB:2,HCT:2,PLT:2 in the last 72 hours BMET No results found for this basename: NA:2,K:2,CL:2,CO2:2,GLUCOSE:2,BUN:2,CREATININE:2,CALCIUM:2 in the last 72 hours PT/INR  Basename 11/07/11 0836  LABPROT 13.7  INR 1.03   ABG No results found for this basename: PHART:2,PCO2:2,PO2:2,HCO3:2 in the last 72 hours  Studies/Results: Ct Guided Abscess Drain  11/07/2011  *RADIOLOGY REPORT*  Clinical history:Diverticular abscess.  PROCEDURE(S): CT GUIDED ABSCESS DRAIN PLACEMENT  Physician: Stephan Minister. Henn, MD  Medications:Versed 3 mg, Fentanyl 100 mcg. A radiology nurse monitored the patient for moderate sedation.  Moderate sedation time:28 minutes  Fluoroscopy time: 4 seconds of CT fluoroscopy  Procedure:The procedure was explained to the patient.  The risks and benefits of the procedure were discussed and the patient's questions were addressed.  Informed consent was obtained from the patient.  The patient is placed prone on the CT scanner.  Images of the pelvis were obtained.  The small pelvic abscess was identified. The left gluteal region was prepped and draped in a sterile fashion.  Skin was anesthetized with lidocaine.  70 gauge needle was directed towards the small abscess collection.  CT fluoroscopy was used to  place the needle directly within the lesion.  Amplatz wire was placed and the tract was dilated to 10-French.  10-French Bettey Mare drainage catheter was placed.  5 ml of yellow purulent fluid was aspirated.  Sample sent for Gram stain and culture. Catheter sutured to the skin and attached to a suction bulb.  Findings:2.3 cm air-fluid collection in the pelvis.  10-French drain was placed in the collection and 5 ml of purulent fluid was removed.  Complications: None  Impression:CT guided placement of a drainage catheter in the small pelvic abscess.  Original Report Authenticated By: Markus Daft, M.D.   Ct Abdomen Pelvis W Contrast  11/06/2011  *RADIOLOGY REPORT*  Clinical Data: Followup diverticulitis.  CT ABDOMEN AND PELVIS WITH CONTRAST  Technique:  Multidetector CT imaging of the abdomen and pelvis was performed following the standard protocol during bolus administration of intravenous contrast.  Contrast:  80 ml Omnipaque-300.  Comparison: 11/02/2011.  Findings: Diffuse inflammation surrounding the rectosigmoid colon with abscess in the mesentery measuring 3.3 x 1.9 x 4 cm versus prior 2.5 x 1.7 x 3.3 cm.  This is suggestive of sigmoid diverticulitis with perforation and subsequent abscess.  Follow-up until clearance recommended to evaluate underlying colon and exclude malignancy.  Scattered normal sized pelvic lymph nodes slightly asymmetric greater on the left.  Post hysterectomy.  Decompressed urinary bladder.  Fatty infiltration of the liver without focal hepatic lesion.  Post cholecystectomy.  No focal splenic, pancreatic, adrenal or renal mass.  Mitral valve calcifications.  Atherosclerotic type changes of the abdominal aorta with mild narrowing of the celiac artery origin.  Scarring lung bases.  Right breast prosthesis  is in place.  No bony destructive lesion.  Degenerative changes lower lumbar spine.  Mild hip joint degenerative changes.  Top normal sized interaortocaval lymph node.  IMPRESSION:  Findings consistent with perforated sigmoid diverticulitis with adjacent abscess which has increased minimally in size when compared to the prior exam as discussed above.  Original Report Authenticated By: Doug Sou, M.D.    Anti-infectives: Anti-infectives     Start     Dose/Rate Route Frequency Ordered Stop   11/02/11 2200   ciprofloxacin (CIPRO) IVPB 400 mg        400 mg 200 mL/hr over 60 Minutes Intravenous Every 12 hours 11/02/11 2013     11/02/11 2015   metroNIDAZOLE (FLAGYL) IVPB 500 mg        500 mg 100 mL/hr over 60 Minutes Intravenous Every 8 hours 11/02/11 2013     11/02/11 1930   ciprofloxacin (CIPRO) IVPB 400 mg  Status:  Discontinued        400 mg 200 mL/hr over 60 Minutes Intravenous  Once 11/02/11 1921 11/02/11 2013   11/02/11 1930   metroNIDAZOLE (FLAGYL) IVPB 500 mg  Status:  Discontinued        500 mg 100 mL/hr over 60 Minutes Intravenous  Once 11/02/11 1921 11/02/11 2013          Assessment/Plan: s/p  Left transgluteal diverticular abscess drain placed 1/3 Pt feels better Will follow Plan per CCS   Lucilia Yanni A 11/08/2011

## 2011-11-08 NOTE — Discharge Summary (Signed)
Patient ID: Leslie Bryant MRN: [id number] DOB/AGE: [birth year] 72 y.o.  Admit date: 11/02/2011 Discharge date: 11/08/2011  Procedures:  Percutaneous drainage of intra-abdominal abscess  Consults: none  Reason for Admission:  This is a 71 yo WF who had a 2 day history of abdominal pain.  She saw her PCP who sent her for a CT scan.  This revealed diverticulitis with phlegmon.  She was sent to [facility] for evaluation.  Admission Diagnoses: 1. Diverticulitis with phlegmon Patient Active Problem List  Diagnoses  . HYPERLIPIDEMIA  . ANXIETY  . DEPRESSION  . EUSTACHIAN TUBE DYSFUNCTION, LEFT  . HYPERTENSION  . ALLERGIC RHINITIS  . GERD  . DIVERTICULOSIS, COLON  . IBS  . UTI  . OSTEOPENIA  . DYSURIA  . BREAST CANCER, HX OF  . COLONIC POLYPS, HX OF  . Palpitations  . Supraventricular tachycardia  . Hypercholesterolemia  . Osteoarthritis, knee  . History of breast cancer  . Malaise and fatigue  . Diverticulitis of large intestine with perforation    Hospital Course: The patient was admitted and placed on IV Cipro and Flagyl.  She was made NPO as well.  Over the first several days her WBCs began to trend down and her pain began to resolve.  Eventually, she was pain free with a normal WBC.  Her diet was advanced as she tolerated it to a low fiber diet.  She did have a repeat CT scan which revealed a 3 cm abscess.  This was percutaneously drained.  The following day she had minimal pain and was still tolerating a regular diet.  She had home health set up for drain flushes.  She will get a repeat CT scan in 1 week for drain and abscess evaluation.  Discharge Diagnoses:  Active Problems:  Diverticulitis of large intestine with perforation Intra-abd abscess, s/p percutaneous drainage Patient Active Problem List  Diagnoses  . HYPERLIPIDEMIA  . ANXIETY  . DEPRESSION  . EUSTACHIAN TUBE DYSFUNCTION, LEFT  . HYPERTENSION  . ALLERGIC RHINITIS  . GERD  . DIVERTICULOSIS, COLON  . IBS  . UTI    . OSTEOPENIA  . DYSURIA  . BREAST CANCER, HX OF  . COLONIC POLYPS, HX OF  . Palpitations  . Supraventricular tachycardia  . Hypercholesterolemia  . Osteoarthritis, knee  . History of breast cancer  . Malaise and fatigue  . Diverticulitis of large intestine with perforation     Discharge Medications: Current Discharge Medication List    START taking these medications   Details  ciprofloxacin (CIPRO) 500 MG tablet Take 1 tablet (500 mg total) by mouth 2 (two) times daily. Qty: 28 tablet, Refills: 1    metroNIDAZOLE (FLAGYL) 500 MG tablet Take 1 tablet (500 mg total) by mouth 3 (three) times daily. Qty: 42 tablet, Refills: 1      CONTINUE these medications which have NOT CHANGED   Details  aspirin EC 81 MG tablet Take 81 mg by mouth daily.      Calcium Carbonate-Vitamin D (CALCIUM + D PO) Take 1,200 mg by mouth.      co-enzyme Q-10 30 MG capsule Take 30 mg by mouth 2 (two) times daily.     ezetimibe (ZETIA) 10 MG tablet Take 1 tablet (10 mg total) by mouth daily. Qty: 90 tablet, Refills: 3   Associated Diagnoses: Hyperlipidemia    metoprolol (LOPRESSOR) 50 MG tablet Take 0.5 tablets (25 mg total) by mouth 2 (two) times daily. Qty: 90 tablet, Refills: 3    Multiple Vitamins-Minerals (  CENTRUM SILVER PO) Take 1 tablet by mouth daily.     Omega-3 Fatty Acids (OMEGA 3 PO) Take 2 tablets by mouth daily.     omeprazole (PRILOSEC) 40 MG capsule TAKE 1 CAPSULE DAILY Qty: 90 capsule, Refills: 2   Associated Diagnoses: GERD (gastroesophageal reflux disease)        Discharge Instructions: Follow up with [name] as needed, not on 11/08/2011 Our office will call with time for repeat CT scan in 1 week Resume a low fiber diet for 1-2 weeks. Follow-up Information    Follow up with [name], MD on 11/08/2011.      Follow up with Pedro Earls, MD. (our office will call you)    Contact information:   [facility], [place],  [place] [phone]          Signed: Henreitta Cea 11/08/2011, 9:02 AM

## 2011-11-10 LAB — CULTURE, ROUTINE-ABSCESS

## 2011-11-15 ENCOUNTER — Ambulatory Visit (HOSPITAL_COMMUNITY)
Admission: RE | Admit: 2011-11-15 | Discharge: 2011-11-15 | Disposition: A | Discharge status: Home / Self Care | Payer: Medicare Other | Source: Ambulatory Visit | Attending: Surgery | Admitting: Surgery

## 2011-11-15 ENCOUNTER — Encounter (HOSPITAL_COMMUNITY): Payer: Self-pay

## 2011-11-15 DIAGNOSIS — K651 Peritoneal abscess: Secondary | ICD-10-CM

## 2011-11-15 DIAGNOSIS — K7689 Other specified diseases of liver: Secondary | ICD-10-CM | POA: Insufficient documentation

## 2011-11-15 DIAGNOSIS — I059 Rheumatic mitral valve disease, unspecified: Secondary | ICD-10-CM | POA: Insufficient documentation

## 2011-11-15 DIAGNOSIS — N731 Chronic parametritis and pelvic cellulitis: Secondary | ICD-10-CM | POA: Insufficient documentation

## 2011-11-15 MED ORDER — IOHEXOL 300 MG/ML  SOLN
80.0000 mL | Freq: Once | INTRAMUSCULAR | Status: AC | PRN
  Administered 2011-11-15: 80 mL via INTRAVENOUS

## 2011-11-22 ENCOUNTER — Ambulatory Visit (INDEPENDENT_AMBULATORY_CARE_PROVIDER_SITE_OTHER): Payer: Medicare Other | Admitting: Surgery

## 2011-11-22 ENCOUNTER — Encounter (INDEPENDENT_AMBULATORY_CARE_PROVIDER_SITE_OTHER): Payer: Self-pay | Admitting: Surgery

## 2011-11-22 VITALS — BP 132/80 | HR 68 | Temp 97.9°F | Resp 18 | Ht 62.0 in | Wt 150.0 lb

## 2011-11-22 DIAGNOSIS — K5720 Diverticulitis of large intestine with perforation and abscess without bleeding: Secondary | ICD-10-CM

## 2011-11-22 DIAGNOSIS — K5732 Diverticulitis of large intestine without perforation or abscess without bleeding: Secondary | ICD-10-CM

## 2011-11-22 NOTE — Progress Notes (Signed)
Leslie Bryant comes in today in followup. I reviewed her CT scan where she had a posterior placed drain that has effectively drained the abscess and she developed. In questioning her this developed after she ate a lot of peanuts and popcorn. Warm her about eating such an told her I would go on a low-residue diet for about 2 months before going back on her fiber diet.  We will be glad to see him as an as needed basis.  Return  p.r.n.

## 2011-12-18 ENCOUNTER — Other Ambulatory Visit (INDEPENDENT_AMBULATORY_CARE_PROVIDER_SITE_OTHER): Payer: Self-pay | Admitting: Surgery

## 2011-12-18 ENCOUNTER — Telehealth (INDEPENDENT_AMBULATORY_CARE_PROVIDER_SITE_OTHER): Payer: Self-pay

## 2011-12-18 ENCOUNTER — Other Ambulatory Visit (INDEPENDENT_AMBULATORY_CARE_PROVIDER_SITE_OTHER): Payer: Self-pay

## 2011-12-18 DIAGNOSIS — K5792 Diverticulitis of intestine, part unspecified, without perforation or abscess without bleeding: Secondary | ICD-10-CM

## 2011-12-18 MED ORDER — CIPROFLOXACIN HCL 500 MG PO TABS: 500.0000 mg | ORAL_TABLET | Freq: Two times a day (BID) | ORAL | Status: DC

## 2011-12-18 NOTE — Telephone Encounter (Signed)
Pt called this morning to report that last night had severe abdominal gas-like pains, and a fever of 101F.  She is feeling somewhat better this morning, but has a low grade temp of 99.2.  Dr. Hassell Done was paged and ordered CT of pelvis w/ contrast.  Pt will be started on Cipro 500mg  BID and Flagyl 500mg  TID.

## 2011-12-18 NOTE — Telephone Encounter (Signed)
Pt made aware that Rx's were called in to [facility], and that she will be contacted to schedule CT of abd and pelvis.  Pt agreed.

## 2011-12-19 ENCOUNTER — Ambulatory Visit
Admission: RE | Admit: 2011-12-19 | Discharge: 2011-12-19 | Disposition: A | Discharge status: Home / Self Care | Payer: Medicare Other | Source: Ambulatory Visit | Attending: Surgery | Admitting: Surgery

## 2011-12-19 DIAGNOSIS — K5792 Diverticulitis of intestine, part unspecified, without perforation or abscess without bleeding: Secondary | ICD-10-CM

## 2011-12-19 MED ORDER — IOHEXOL 300 MG/ML  SOLN
100.0000 mL | Freq: Once | INTRAMUSCULAR | Status: AC | PRN
  Administered 2011-12-19: 100 mL via INTRAVENOUS

## 2011-12-23 ENCOUNTER — Telehealth (INDEPENDENT_AMBULATORY_CARE_PROVIDER_SITE_OTHER): Payer: Self-pay | Admitting: General Surgery

## 2011-12-23 NOTE — Telephone Encounter (Signed)
Returned the patients phone call regarding her CT results. Expressed to her I would send a note to Dr Hassell Done and return her call with instructions.

## 2011-12-25 ENCOUNTER — Telehealth (INDEPENDENT_AMBULATORY_CARE_PROVIDER_SITE_OTHER): Payer: Self-pay | Admitting: General Surgery

## 2011-12-25 NOTE — Telephone Encounter (Signed)
Contacted the patient and advised her that Dr Hassell Done stated the abscess cavity is clear and it seems to be resolving itself. CT is negative for any concerns.

## 2012-01-27 ENCOUNTER — Telehealth: Payer: Self-pay | Admitting: Cardiology

## 2012-01-27 DIAGNOSIS — K219 Gastro-esophageal reflux disease without esophagitis: Secondary | ICD-10-CM

## 2012-01-27 NOTE — Telephone Encounter (Signed)
Pt needs refill of omeprazole 90 days with refill for 1 year to fax to [facility] [phone]

## 2012-01-28 MED ORDER — OMEPRAZOLE 40 MG PO CPDR: 40.0000 mg | DELAYED_RELEASE_CAPSULE | Freq: Every day | ORAL | Status: DC

## 2012-02-06 ENCOUNTER — Encounter: Payer: Self-pay | Admitting: Cardiology

## 2012-02-06 ENCOUNTER — Ambulatory Visit (INDEPENDENT_AMBULATORY_CARE_PROVIDER_SITE_OTHER): Payer: Medicare Other | Admitting: Cardiology

## 2012-02-06 VITALS — BP 120/70 | HR 80 | Ht 62.0 in | Wt 141.0 lb

## 2012-02-06 DIAGNOSIS — R002 Palpitations: Secondary | ICD-10-CM

## 2012-02-06 DIAGNOSIS — R5383 Other fatigue: Secondary | ICD-10-CM

## 2012-02-06 DIAGNOSIS — R5381 Other malaise: Secondary | ICD-10-CM

## 2012-02-06 DIAGNOSIS — K5790 Diverticulosis of intestine, part unspecified, without perforation or abscess without bleeding: Secondary | ICD-10-CM

## 2012-02-06 DIAGNOSIS — K5730 Diverticulosis of large intestine without perforation or abscess without bleeding: Secondary | ICD-10-CM

## 2012-02-06 DIAGNOSIS — E7800 Pure hypercholesterolemia, unspecified: Secondary | ICD-10-CM

## 2012-02-06 DIAGNOSIS — I10 Essential (primary) hypertension: Secondary | ICD-10-CM

## 2012-02-06 LAB — CBC WITH DIFFERENTIAL/PLATELET
Basophils Relative: 0.2 % (ref 0.0–3.0)
Eosinophils Absolute: 0.1 10*3/uL (ref 0.0–0.7)
HCT: 36.5 % (ref 36.0–46.0)
Hemoglobin: 12.2 g/dL (ref 12.0–15.0)
Lymphocytes Relative: 32.3 % (ref 12.0–46.0)
Lymphs Abs: 2 10*3/uL (ref 0.7–4.0)
MCHC: 33.3 g/dL (ref 30.0–36.0)
Neutro Abs: 3.5 10*3/uL (ref 1.4–7.7)
RBC: 3.93 Mil/uL (ref 3.87–5.11)
RDW: 12.8 % (ref 11.5–14.6)

## 2012-02-06 LAB — BASIC METABOLIC PANEL
CO2: 28 mEq/L (ref 19–32)
Calcium: 9.7 mg/dL (ref 8.4–10.5)
Chloride: 99 mEq/L (ref 96–112)
Glucose, Bld: 101 mg/dL — ABNORMAL HIGH (ref 70–99)
Potassium: 4.3 mEq/L (ref 3.5–5.1)
Sodium: 136 mEq/L (ref 135–145)

## 2012-02-06 LAB — LIPID PANEL
HDL: 54.9 mg/dL (ref >39.00)
LDL Cholesterol: 111 mg/dL — ABNORMAL HIGH (ref 0–99)
Total CHOL/HDL Ratio: 3
Triglycerides: 76 mg/dL (ref 0.0–149.0)
VLDL: 15.2 mg/dL (ref 0.0–40.0)

## 2012-02-06 LAB — HEPATIC FUNCTION PANEL
Albumin: 4 g/dL (ref 3.5–5.2)
Total Bilirubin: 0.5 mg/dL (ref 0.3–1.2)

## 2012-02-06 NOTE — Assessment & Plan Note (Signed)
The patient has not been experiencing any recent palpitations.

## 2012-02-06 NOTE — Patient Instructions (Signed)
Your physician wants you to follow-up in: 6 months with Dr. Mare Ferrari.   You will receive a reminder letter in the mail two months in advance. If you don't receive a letter, please call our office to schedule the  follow-up appointment.  Your physician recommends that you return for lab work in: 6 months.  Lipids, Liver, and BMET.  Labs today:  CBC, Lipid Panel, Liver Function, and BMET

## 2012-02-06 NOTE — Assessment & Plan Note (Signed)
The patient has a tendency toward firm stools.  She will be getting a stool softener and she will is made sure she drinks plenty of water to prevent subsequent episodes of diverticulitis and constipation.

## 2012-02-06 NOTE — Progress Notes (Signed)
[name] Date of Birth:  [birth year] [facility] [address] [place], [place]  [address] [phone]  Fax   [phone]  HPI: This pleasant 71 year old woman is seen for a scheduled office visit.  She has a past history of high cholesterol and a history of palpitations.  She does not have any history of ischemic heart disease.  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  She has normal left ventricular function.  She was in good health until December 2012 when she developed chills fever and abdominal pain and was hospitalized with acute diverticulitis with mini perforation and developed a abscess which had to be drained by Dr. Hassell Done.  The patient had not been aware that she had diverticulosis until she had the episode of severe diverticulitis.  Current Outpatient Prescriptions  Medication Sig Dispense Refill  . aspirin EC 81 MG tablet Take 81 mg by mouth daily.        . bifidobacterium infantis (ALIGN) capsule Take 1 capsule by mouth daily.      . Calcium Carbonate-Vitamin D (CALCIUM + D PO) Take 1,200 mg by mouth.        . co-enzyme Q-10 30 MG capsule Take 30 mg by mouth 2 (two) times daily.       Marland Kitchen ezetimibe (ZETIA) 10 MG tablet Take 1 tablet (10 mg total) by mouth daily.  90 tablet  3  . metoprolol (LOPRESSOR) 50 MG tablet Take 0.5 tablets (25 mg total) by mouth 2 (two) times daily.  90 tablet  3  . Multiple Vitamins-Minerals (CENTRUM SILVER PO) Take 1 tablet by mouth daily.       . Omega-3 Fatty Acids (OMEGA 3 PO) Take 2 tablets by mouth daily.       Marland Kitchen omeprazole (PRILOSEC) 40 MG capsule Take 1 capsule (40 mg total) by mouth daily.  90 capsule  2  . ESTRING 2 MG vaginal ring as directed.        Allergies  Allergen Reactions  . Crestor (Rosuvastatin Calcium) Other (See  Comments)    Shoulder pain  . Lipitor (Atorvastatin Calcium) Other (See Comments)    Myalgias   . Pravastatin Other (See Comments)    myalgias  . Penicillins Itching    Patient Active Problem List  Diagnoses  . HYPERLIPIDEMIA  . ANXIETY  . DEPRESSION  . EUSTACHIAN TUBE DYSFUNCTION, LEFT  . HYPERTENSION  . ALLERGIC RHINITIS  . GERD  . DIVERTICULOSIS, COLON  . IBS  . UTI  . OSTEOPENIA  . DYSURIA  . BREAST CANCER, HX OF  . COLONIC POLYPS, HX OF  . Palpitations  . Supraventricular tachycardia  . Hypercholesterolemia  . Osteoarthritis, knee  . History of breast cancer  . Malaise and fatigue  . Diverticulitis of large intestine with perforation    History  Smoking status  . Never Smoker   Smokeless tobacco  . Never Used    History  Alcohol Use  . 0.5 oz/week  . 1 drink(s) per week    Family History  Problem Relation Age of Onset  . Heart attack Father   . Heart failure Sister     Review of Systems: The patient denies any heat or cold intolerance.  No weight gain or weight loss.  The patient denies headaches or blurry vision.  There is no cough or sputum  production.  The patient denies dizziness.  There is no hematuria or hematochezia.  The patient denies any muscle aches or arthritis.  The patient denies any rash.  The patient denies frequent falling or instability.  There is no history of depression or anxiety.  All other systems were reviewed and are negative.   Physical Exam: Filed Vitals:   02/06/12 1012  BP: 120/70  Pulse: 80   the general appearance reveals a well-developed well-nourished woman in no distress.The head and neck exam reveals pupils equal and reactive.  Extraocular movements are full.  There is no scleral icterus.  The mouth and pharynx are normal.  The neck is supple.  The carotids reveal no bruits.  The jugular venous pressure is normal.  The  thyroid is not enlarged.  There is no lymphadenopathy.  The chest is clear to percussion and  auscultation.  There are no rales or rhonchi.  Expansion of the chest is symmetrical.  The precordium is quiet.  The first heart sound is normal.  The second heart sound is physiologically split.  There is no murmur gallop rub or click.  There is no abnormal lift or heave.  The abdomen is soft and nontender.  The bowel sounds are normal.  The liver and spleen are not enlarged.  There are no abdominal masses.  There are no abdominal bruits.  Extremities reveal good pedal pulses.  There is no phlebitis or edema.  There is no cyanosis or clubbing.  Strength is normal and symmetrical in all extremities.  There is no lateralizing weakness.  There are no sensory deficits.  The skin is warm and dry.  There is no rash.      Assessment / Plan: We are checking blood work today including a CBC since she does have a past history of anemia.  She sustained same medication recheck in 6 months for followup office visit and fasting lipid panel hepatic function panel and basal metabolic panel

## 2012-02-06 NOTE — Progress Notes (Signed)
Patient ID: Leslie Bryant, female   DOB: 1941-05-26, 71 y.o.   MRN: [id number]

## 2012-02-06 NOTE — Assessment & Plan Note (Signed)
The patient has a history of hypercholesterolemia.  We are checking lab work today.

## 2012-02-06 NOTE — Assessment & Plan Note (Signed)
Blood pressure is remaining stable on current therapy.  She is not having any dizziness or syncope or symptoms of heart failure

## 2012-02-11 ENCOUNTER — Telehealth: Payer: Self-pay | Admitting: *Deleted

## 2012-02-11 NOTE — Telephone Encounter (Signed)
Message copied by Earvin Hansen on Tue Feb 11, 2012  9:26 AM ------      Message from: Darlin Coco      Created: Thu Feb 06, 2012  4:59 PM       Please report.  The blood tests are stable except the LDL cholesterol is slightly higher at 111.  Her hemoglobin has improved since December when she was in the hospital.  Continue same medication and careful low-cholesterol diet.

## 2012-02-11 NOTE — Telephone Encounter (Signed)
Advised of labs 

## 2012-03-04 ENCOUNTER — Other Ambulatory Visit: Payer: Self-pay | Admitting: Cardiology

## 2012-03-04 ENCOUNTER — Other Ambulatory Visit: Payer: Self-pay

## 2012-03-04 MED ORDER — METOPROLOL TARTRATE 50 MG PO TABS: 25.0000 mg | ORAL_TABLET | Freq: Two times a day (BID) | ORAL | Status: DC

## 2012-03-04 NOTE — Telephone Encounter (Signed)
Pt only has enough for two more days

## 2012-03-16 ENCOUNTER — Telehealth: Payer: Self-pay

## 2012-03-16 DIAGNOSIS — M8580 Other specified disorders of bone density and structure, unspecified site: Secondary | ICD-10-CM

## 2012-03-16 NOTE — Telephone Encounter (Signed)
Patient called to inform she is scheduled for OV in July.  She would like to go ahead and schedule bone density if due, please advise

## 2012-03-16 NOTE — Telephone Encounter (Signed)
Called the patient informed order was in and transferred to scheduler to schedule.

## 2012-03-16 NOTE — Telephone Encounter (Signed)
Done per emr, please have pt talk to schedulers

## 2012-03-18 ENCOUNTER — Ambulatory Visit (INDEPENDENT_AMBULATORY_CARE_PROVIDER_SITE_OTHER)
Admission: RE | Admit: 2012-03-18 | Discharge: 2012-03-18 | Disposition: A | Discharge status: Home / Self Care | Payer: Medicare Other | Source: Ambulatory Visit

## 2012-03-18 DIAGNOSIS — M899 Disorder of bone, unspecified: Secondary | ICD-10-CM

## 2012-03-18 DIAGNOSIS — M949 Disorder of cartilage, unspecified: Secondary | ICD-10-CM

## 2012-03-18 DIAGNOSIS — M8580 Other specified disorders of bone density and structure, unspecified site: Secondary | ICD-10-CM

## 2012-05-05 ENCOUNTER — Other Ambulatory Visit (INDEPENDENT_AMBULATORY_CARE_PROVIDER_SITE_OTHER): Payer: Medicare Other

## 2012-05-05 ENCOUNTER — Encounter: Payer: Self-pay | Admitting: Internal Medicine

## 2012-05-05 ENCOUNTER — Ambulatory Visit (INDEPENDENT_AMBULATORY_CARE_PROVIDER_SITE_OTHER): Payer: Medicare Other | Admitting: Internal Medicine

## 2012-05-05 VITALS — BP 122/72 | HR 84 | Temp 97.2°F | Ht 62.0 in | Wt 145.5 lb

## 2012-05-05 DIAGNOSIS — Z0000 Encounter for general adult medical examination without abnormal findings: Secondary | ICD-10-CM

## 2012-05-05 DIAGNOSIS — Z0001 Encounter for general adult medical examination with abnormal findings: Secondary | ICD-10-CM | POA: Insufficient documentation

## 2012-05-05 DIAGNOSIS — Z79899 Other long term (current) drug therapy: Secondary | ICD-10-CM

## 2012-05-05 DIAGNOSIS — Z800 Family history of malignant neoplasm of digestive organs: Secondary | ICD-10-CM | POA: Insufficient documentation

## 2012-05-05 DIAGNOSIS — Z23 Encounter for immunization: Secondary | ICD-10-CM

## 2012-05-05 LAB — URINALYSIS, ROUTINE W REFLEX MICROSCOPIC
Hgb urine dipstick: NEGATIVE
Ketones, ur: NEGATIVE
Specific Gravity, Urine: 1.01 (ref 1.000–1.030)
Total Protein, Urine: NEGATIVE
Urine Glucose: NEGATIVE

## 2012-05-05 NOTE — Patient Instructions (Addendum)
You had the Shingles shot today Continue all other medications as before Please have the pharmacy call with any refills you may need. Please go to LAB in the Basement for the blood and/or urine tests to be done today  You will be contacted by phone if any changes need to be made immediately.  Otherwise, you will receive a letter about your results with an explanation. You are otherwise up to date with prevention Please return in 1 year for your yearly visit, or sooner if needed, with Lab testing done 3-5 days before

## 2012-05-05 NOTE — Assessment & Plan Note (Signed)

## 2012-05-08 ENCOUNTER — Encounter: Payer: Self-pay | Admitting: Internal Medicine

## 2012-05-08 NOTE — Progress Notes (Signed)
Subjective:    Patient ID: Leslie Bryant, female    DOB: 28-Apr-1941, 71 y.o.   MRN: [id number]  HPI Here for wellness and f/u;  Overall doing ok;  Pt denies CP, worsening SOB, DOE, wheezing, orthopnea, PND, worsening LE edema, palpitations, dizziness or syncope.  Pt denies neurological change such as new Headache, facial or extremity weakness.  Pt denies polydipsia, polyuria, or low sugar symptoms. Pt states overall good compliance with treatment and medications, good tolerability, and trying to follow lower cholesterol diet.  Pt denies worsening depressive symptoms, suicidal ideation or panic. No fever, wt loss, night sweats, loss of appetite, or other constitutional symptoms.  Pt states good ability with ADL's, low fall risk, home safety reviewed and adequate, no significant changes in hearing or vision, and occasionally active with exercise. Does have some bilat ankle stiffness in the AM, but no other complaints. Past Medical History  Diagnosis Date  . Palpitations   . Supraventricular tachycardia   . Hypercholesterolemia   . Broken humerus     Right  . Labile hypertension   . GERD (gastroesophageal reflux disease)   . Chest pain, atypical   . Depression   . Gall bladder stones   . Ulcer     History of ulcer  . Headache   . Anxiety   . Arthritis   . Family hx of colon cancer 05/05/2012    brother   Past Surgical History  Procedure Date  . Mastectomy 1986    Right  . Cholecystectomy   . Rt knee surgery 04/2-12  . Breast surgery   . Abdominal hysterectomy     reports that she has never smoked. She has never used smokeless tobacco. She reports that she drinks about .5 ounces of alcohol per week. Her drug history not on file. family history includes Heart attack in her father and Heart failure in her sister. Allergies  Allergen Reactions  . Crestor (Rosuvastatin Calcium) Other (See Comments)    Shoulder pain  . Lipitor (Atorvastatin Calcium) Other (See Comments)    Myalgias   .  Pravastatin Other (See Comments)    myalgias  . Penicillins Itching   Current Outpatient Prescriptions on File Prior to Visit  Medication Sig Dispense Refill  . aspirin EC 81 MG tablet Take 81 mg by mouth daily.        . bifidobacterium infantis (ALIGN) capsule Take 1 capsule by mouth daily.      . Calcium Carbonate-Vitamin D (CALCIUM + D PO) Take 1,200 mg by mouth.        . co-enzyme Q-10 30 MG capsule Take 30 mg by mouth 2 (two) times daily.       Marland Kitchen ESTRING 2 MG vaginal ring as directed.      . metoprolol (LOPRESSOR) 50 MG tablet Take 0.5 tablets (25 mg total) by mouth 2 (two) times daily.  90 tablet  3  . Multiple Vitamins-Minerals (CENTRUM SILVER PO) Take 1 tablet by mouth daily.       . Omega-3 Fatty Acids (OMEGA 3 PO) Take 2 tablets by mouth daily.       Marland Kitchen omeprazole (PRILOSEC) 40 MG capsule Take 1 capsule (40 mg total) by mouth daily.  90 capsule  2  . ezetimibe (ZETIA) 10 MG tablet Take 1 tablet (10 mg total) by mouth daily.  90 tablet  3   Review of Systems Review of Systems  Constitutional: Negative for diaphoresis, activity change, appetite change and unexpected weight change.  HENT:  Negative for hearing loss, ear pain, facial swelling, mouth sores and neck stiffness.   Eyes: Negative for pain, redness and visual disturbance.  Respiratory: Negative for shortness of breath and wheezing.   Cardiovascular: Negative for chest pain and palpitations.  Gastrointestinal: Negative for diarrhea, blood in stool, abdominal distention and rectal pain.  Genitourinary: Negative for hematuria, flank pain and decreased urine volume.  Musculoskeletal: Negative for myalgias and joint swelling.  Skin: Negative for color change and wound.  Neurological: Negative for syncope and numbness.  Hematological: Negative for adenopathy.  Psychiatric/Behavioral: Negative for hallucinations, self-injury, decreased concentration and agitation.      Objective:   Physical Exam BP 122/72  Pulse 84  Temp  97.2 F (36.2 C) (Oral)  Ht 5\' 2"  (1.575 m)  Wt 145 lb 8 oz (65.998 kg)  BMI 26.61 kg/m2  SpO2 97% Physical Exam  VS noted Constitutional: Pt is oriented to person, place, and time. Appears well-developed and well-nourished.  HENT:  Head: Normocephalic and atraumatic.  Right Ear: External ear normal.  Left Ear: External ear normal.  Nose: Nose normal.  Mouth/Throat: Oropharynx is clear and moist.  Eyes: Conjunctivae and EOM are normal. Pupils are equal, round, and reactive to light.  Neck: Normal range of motion. Neck supple. No JVD present. No tracheal deviation present.  Cardiovascular: Normal rate, regular rhythm, normal heart sounds and intact distal pulses.   Pulmonary/Chest: Effort normal and breath sounds normal.  Abdominal: Soft. Bowel sounds are normal. There is no tenderness.  Musculoskeletal: Normal range of motion. Exhibits no edema.  Lymphadenopathy:  Has no cervical adenopathy.  Neurological: Pt is alert and oriented to person, place, and time. Pt has normal reflexes. No cranial nerve deficit.  Skin: Skin is warm and dry. No rash noted.  Psychiatric:  Has  normal mood and affect. Behavior is normal.  Bialt ankles without tender, swelling and has FROM    Assessment & Plan:

## 2012-06-02 ENCOUNTER — Other Ambulatory Visit: Payer: Self-pay | Admitting: Internal Medicine

## 2012-06-02 DIAGNOSIS — K56609 Unspecified intestinal obstruction, unspecified as to partial versus complete obstruction: Secondary | ICD-10-CM

## 2012-06-02 NOTE — Telephone Encounter (Signed)
Due for colonsocpy as per last hospn - will refer

## 2012-07-08 ENCOUNTER — Ambulatory Visit: Payer: Medicare Other | Admitting: Internal Medicine

## 2012-07-23 ENCOUNTER — Ambulatory Visit (INDEPENDENT_AMBULATORY_CARE_PROVIDER_SITE_OTHER): Payer: Medicare Other | Admitting: Internal Medicine

## 2012-07-23 ENCOUNTER — Other Ambulatory Visit (INDEPENDENT_AMBULATORY_CARE_PROVIDER_SITE_OTHER): Payer: Medicare Other

## 2012-07-23 ENCOUNTER — Telehealth: Payer: Self-pay

## 2012-07-23 ENCOUNTER — Encounter: Payer: Self-pay | Admitting: Internal Medicine

## 2012-07-23 VITALS — BP 112/68 | HR 91 | Temp 98.1°F | Ht 62.0 in | Wt 146.5 lb

## 2012-07-23 DIAGNOSIS — Z0000 Encounter for general adult medical examination without abnormal findings: Secondary | ICD-10-CM

## 2012-07-23 DIAGNOSIS — Z23 Encounter for immunization: Secondary | ICD-10-CM

## 2012-07-23 DIAGNOSIS — R1030 Lower abdominal pain, unspecified: Secondary | ICD-10-CM

## 2012-07-23 DIAGNOSIS — I10 Essential (primary) hypertension: Secondary | ICD-10-CM

## 2012-07-23 DIAGNOSIS — R109 Unspecified abdominal pain: Secondary | ICD-10-CM

## 2012-07-23 DIAGNOSIS — E875 Hyperkalemia: Secondary | ICD-10-CM

## 2012-07-23 DIAGNOSIS — F411 Generalized anxiety disorder: Secondary | ICD-10-CM

## 2012-07-23 LAB — HEPATIC FUNCTION PANEL
ALT: 25 U/L (ref 0–35)
Albumin: 3.9 g/dL (ref 3.5–5.2)
Alkaline Phosphatase: 70 U/L (ref 39–117)
Bilirubin, Direct: 0.1 mg/dL (ref 0.0–0.3)
Total Protein: 7.6 g/dL (ref 6.0–8.3)

## 2012-07-23 LAB — BASIC METABOLIC PANEL
CO2: 28 mEq/L (ref 19–32)
Chloride: 102 mEq/L (ref 96–112)
Creatinine, Ser: 0.9 mg/dL (ref 0.4–1.2)
Glucose, Bld: 109 mg/dL — ABNORMAL HIGH (ref 70–99)

## 2012-07-23 LAB — CBC WITH DIFFERENTIAL/PLATELET
Basophils Absolute: 0 10*3/uL (ref 0.0–0.1)
Eosinophils Absolute: 0.2 10*3/uL (ref 0.0–0.7)
Lymphocytes Relative: 16.4 % (ref 12.0–46.0)
MCHC: 33.1 g/dL (ref 30.0–36.0)
Neutrophils Relative %: 73 % (ref 43.0–77.0)
RBC: 4.06 Mil/uL (ref 3.87–5.11)
RDW: 12.6 % (ref 11.5–14.6)

## 2012-07-23 LAB — URINALYSIS, ROUTINE W REFLEX MICROSCOPIC
Nitrite: NEGATIVE
Specific Gravity, Urine: 1.01 (ref 1.000–1.030)
Urobilinogen, UA: 0.2 (ref 0.0–1.0)

## 2012-07-23 MED ORDER — METRONIDAZOLE 250 MG PO TABS: 250.0000 mg | ORAL_TABLET | Freq: Three times a day (TID) | ORAL | Status: DC

## 2012-07-23 MED ORDER — CIPROFLOXACIN HCL 500 MG PO TABS: 500.0000 mg | ORAL_TABLET | Freq: Two times a day (BID) | ORAL | Status: DC

## 2012-07-23 NOTE — Telephone Encounter (Signed)
Patient informed. 

## 2012-07-23 NOTE — Telephone Encounter (Signed)
Called the patient left message to call back.  Put order in the lab

## 2012-07-23 NOTE — Patient Instructions (Addendum)
You had the flu shot today Take all new medications as prescribed Please return for any worsening symptoms, as you may need CT scan done Continue all other medications as before Please go to LAB in the Basement for the blood and/or urine tests to be done today You will be contacted by phone if any changes need to be made immediately.  Otherwise, you will receive a letter about your results with an explanation. Please remember to sign up for My Chart at your earliest convenience, as this will be important to you in the future with finding out test results. Please keep your appointments with your specialists as you have planned - Dr Carlean Purl, Oct 8 Please return in July 2014 for your yearly visit, or sooner if needed, with Lab testing done 3-5 days before

## 2012-07-23 NOTE — Telephone Encounter (Signed)
This is very often a spurious lab error, and i think likely in this case as well  Please ask pt to return tomorrow for repeat K - ok for future lab for "hyperkalemia"

## 2012-07-23 NOTE — Telephone Encounter (Signed)
Critical Lab Potassium 6.1

## 2012-07-24 ENCOUNTER — Encounter: Payer: Self-pay | Admitting: Internal Medicine

## 2012-07-24 ENCOUNTER — Other Ambulatory Visit (INDEPENDENT_AMBULATORY_CARE_PROVIDER_SITE_OTHER): Payer: Medicare Other

## 2012-07-24 DIAGNOSIS — E875 Hyperkalemia: Secondary | ICD-10-CM

## 2012-07-25 ENCOUNTER — Encounter: Payer: Self-pay | Admitting: Internal Medicine

## 2012-07-25 NOTE — Assessment & Plan Note (Signed)
stable overall by hx and exam, most recent data reviewed with pt, and pt to continue medical treatment as before BP Readings from Last 3 Encounters:  07/23/12 112/68  05/05/12 122/72  02/06/12 120/70

## 2012-07-25 NOTE — Progress Notes (Signed)
Subjective:    Patient ID: Leslie Bryant, female    DOB: 02-23-1941, 71 y.o.   MRN: [id number]  HPI  Here with 4 days onset gradually worsening mild to mod low mid abd pain/tender without n/v, back pain, bowel or bladder change, wt loss,  worsening LE pain/numbness/weakness, gait change or falls. Has felt warm but no overt fever, chills.   Denies urinary symptoms such as dysuria, frequency, urgency,or hematuria.  Overall feels similar as to prior diverticultiis with hospn jan 2013, requiring repeat antibx feb 2013.  Has GI appt incidentally next month, likely due for colonoscopy soon as she is o/w due.  Pt denies chest pain, increased sob or doe, wheezing, orthopnea, PND, increased LE swelling, palpitations, dizziness or syncope.  Pt denies new neurological symptoms such as new headache, or facial or extremity weakness or numbness   Pt denies polydipsia, polyuria  Denies worsening depressive symptoms, suicidal ideation, or panic Past Medical History  Diagnosis Date  . Palpitations   . Supraventricular tachycardia   . Hypercholesterolemia   . Broken humerus     Right  . Labile hypertension   . GERD (gastroesophageal reflux disease)   . Chest pain, atypical   . Depression   . Gall bladder stones   . Ulcer     History of ulcer  . Headache   . Anxiety   . Arthritis   . Adenomatous colon polyp 1988  . Diverticulosis of colon     sigmoid  . Internal hemorrhoids   . History of hiatal hernia     sliding  . Gastritis and duodenitis    Past Surgical History  Procedure Date  . Mastectomy 1986    Right  . Cholecystectomy   . Rt knee surgery 04/2-12  . Breast surgery   . Abdominal hysterectomy   . Colonoscopy w/ biopsies     multiple  . Panendoscopy 07/24/1995  . Esophagogastroduodenoscopy     reports that she has never smoked. She has never used smokeless tobacco. She reports that she drinks about .5 ounces of alcohol per week. Her drug history not on file. family history includes Colon  cancer in her brother; Heart attack in her father; and Heart failure in her sister. Allergies  Allergen Reactions  . Crestor (Rosuvastatin Calcium) Other (See Comments)    Shoulder pain  . Lipitor (Atorvastatin Calcium) Other (See Comments)    Myalgias   . Pravastatin Other (See Comments)    myalgias  . Penicillins Itching   Current Outpatient Prescriptions on File Prior to Visit  Medication Sig Dispense Refill  . aspirin EC 81 MG tablet Take 81 mg by mouth daily.        . bifidobacterium infantis (ALIGN) capsule Take 1 capsule by mouth daily.      . Calcium Carbonate-Vitamin D (CALCIUM + D PO) Take 1,200 mg by mouth.        . co-enzyme Q-10 30 MG capsule Take 30 mg by mouth 2 (two) times daily.       Marland Kitchen ESTRING 2 MG vaginal ring as directed.      . metoprolol (LOPRESSOR) 50 MG tablet Take 0.5 tablets (25 mg total) by mouth 2 (two) times daily.  90 tablet  3  . Multiple Vitamins-Minerals (CENTRUM SILVER PO) Take 1 tablet by mouth daily.       . Omega-3 Fatty Acids (OMEGA 3 PO) Take 2 tablets by mouth daily.       Marland Kitchen omeprazole (PRILOSEC) 40 MG capsule Take  1 capsule (40 mg total) by mouth daily.  90 capsule  2  . ezetimibe (ZETIA) 10 MG tablet Take 1 tablet (10 mg total) by mouth daily.  90 tablet  3   Review of Systems  Constitutional: Negative for diaphoresis and unexpected weight change.  HENT: Negative for tinnitus.   Eyes: Negative for photophobia and visual disturbance.  Respiratory: Negative for choking and stridor.   Gastrointestinal: Negative for vomiting and blood in stool.  Genitourinary: Negative for hematuria and decreased urine volume.  Musculoskeletal: Negative for gait problem.  Skin: Negative for color change and wound.  Neurological: Negative for tremors and numbness.  Psychiatric/Behavioral: Negative for decreased concentration. The patient is not hyperactive.       Objective:   Physical Exam BP 112/68  Pulse 91  Temp 98.1 F (36.7 C) (Oral)  Ht 5\' 2"   (1.575 m)  Wt 146 lb 8 oz (66.452 kg)  BMI 26.80 kg/m2  SpO2 97% Physical Exam  VS noted Constitutional: Pt appears well-developed and well-nourished.  HENT: Head: Normocephalic.  Right Ear: External ear normal.  Left Ear: External ear normal.  Eyes: Conjunctivae and EOM are normal. Pupils are equal, round, and reactive to light.  Neck: Normal range of motion. Neck supple.  Cardiovascular: Normal rate and regular rhythm.   Pulmonary/Chest: Effort normal and breath sounds normal.  Abd:  Soft, NT, non-distended, + BS except for mild to mod low mid abd tender without gaurding or rebound Neurological: Pt is alert. Not confused  Skin: Skin is warm. No erythema.  Psychiatric: Pt behavior is normal. Thought content normal.     Assessment & Plan:

## 2012-07-25 NOTE — Assessment & Plan Note (Signed)
likey recurrent diverticulitis, for cipro/flagyl asd, for labs today, hold on CT at this time, f/u with GI as planned, declines pain med,  to f/u any worsening symptoms or concerns

## 2012-07-25 NOTE — Assessment & Plan Note (Signed)
stable overall by hx and exam, most recent data reviewed with pt, and pt to continue medical treatment as before Lab Results  Component Value Date   WBC 14.2* 07/23/2012   HGB 12.8 07/23/2012   HCT 38.7 07/23/2012   PLT 274.0 07/23/2012   GLUCOSE 109* 07/23/2012   CHOL 181 02/06/2012   TRIG 76.0 02/06/2012   HDL 54.90 02/06/2012   LDLDIRECT 175.8 02/06/2011   LDLCALC 111* 02/06/2012   ALT 25 07/23/2012   AST 19 07/23/2012   NA 136 07/23/2012   K 4.1 07/24/2012   CL 102 07/23/2012   CREATININE 0.9 07/23/2012   BUN 13 07/23/2012   CO2 28 07/23/2012   TSH 0.92 05/05/2012   INR 1.03 11/07/2011

## 2012-07-27 ENCOUNTER — Telehealth: Payer: Self-pay | Admitting: Cardiology

## 2012-07-27 NOTE — Telephone Encounter (Signed)
Spoke with patient and she stated was told her cholesterol was ok and was to be getting a copy.  Advised didn't see in her chart but could discuss with  Dr. Mare Ferrari at visit.  Is going to bring her copy with her to appointment

## 2012-07-27 NOTE — Telephone Encounter (Signed)
New problem:  Recently had blood work through her pcp . Does she need to have this lab appt.

## 2012-07-29 ENCOUNTER — Other Ambulatory Visit: Payer: Self-pay | Admitting: *Deleted

## 2012-07-29 DIAGNOSIS — E785 Hyperlipidemia, unspecified: Secondary | ICD-10-CM

## 2012-07-29 MED ORDER — EZETIMIBE 10 MG PO TABS: 10.0000 mg | ORAL_TABLET | Freq: Every day | ORAL | Status: DC

## 2012-08-03 ENCOUNTER — Other Ambulatory Visit: Payer: Medicare Other

## 2012-08-05 ENCOUNTER — Encounter: Payer: Self-pay | Admitting: Cardiology

## 2012-08-05 ENCOUNTER — Ambulatory Visit (INDEPENDENT_AMBULATORY_CARE_PROVIDER_SITE_OTHER): Payer: Medicare Other | Admitting: Cardiology

## 2012-08-05 VITALS — BP 138/78 | HR 80 | Ht 62.0 in | Wt 148.0 lb

## 2012-08-05 DIAGNOSIS — I10 Essential (primary) hypertension: Secondary | ICD-10-CM

## 2012-08-05 DIAGNOSIS — E785 Hyperlipidemia, unspecified: Secondary | ICD-10-CM

## 2012-08-05 DIAGNOSIS — R002 Palpitations: Secondary | ICD-10-CM

## 2012-08-05 DIAGNOSIS — E7800 Pure hypercholesterolemia, unspecified: Secondary | ICD-10-CM

## 2012-08-05 LAB — LIPID PANEL
Cholesterol: 178 mg/dL (ref 0–200)
HDL: 55.2 mg/dL (ref >39.00)
LDL Cholesterol: 103 mg/dL — ABNORMAL HIGH (ref 0–99)
VLDL: 19.8 mg/dL (ref 0.0–40.0)

## 2012-08-05 NOTE — Assessment & Plan Note (Signed)
Blood pressure is staying stable on current therapy.  No headaches or dizziness.  No syncope.

## 2012-08-05 NOTE — Patient Instructions (Addendum)
Your physician wants you to follow-up in: 6 months  You will receive a reminder letter in the mail two months in advance. If you don't receive a letter, please call our office to schedule the follow-up appointment.  Your physician recommends that you return for lab work in: today lipid profile  Your physician recommends that you continue on your current medications as directed. Please refer to the Current Medication list given to you today.

## 2012-08-05 NOTE — Assessment & Plan Note (Signed)
The patient has a history of hyperlipidemia and is statin intolerance.  She is able to take ezetimibe.  Blood work drawn today is pending

## 2012-08-05 NOTE — Assessment & Plan Note (Signed)
The patient has a past history of palpitations.  As long as she remembers to take her beta blocker she does fine

## 2012-08-05 NOTE — Progress Notes (Signed)
Rie Alroy Dust Date of Birth:  08-Feb-1941 [facility] [address] [place], [place]  [address] [phone]         Fax   [phone]  History of Present Illness: This pleasant 71 year old woman is seen for a scheduled office visit. She has a past history of high cholesterol and a history of palpitations. She does not have any history of ischemic heart disease. 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 She has normal left ventricular function. She was in good health until December 2012 when she developed chills fever and abdominal pain and was hospitalized with acute diverticulitis with mini perforation and developed a abscess which had to be drained by Dr. Hassell Done. The patient had not been aware that she had diverticulosis until she had the episode of severe diverticulitis.  Since we last saw her she has been doing well.  She had developed some osteoarthritis and was on meloxicam which had to be stopped because she developed a black stools.  She is tolerating Advil   Current Outpatient Prescriptions  Medication Sig Dispense Refill  . aspirin EC 81 MG tablet Take 81 mg by mouth daily.        . bifidobacterium infantis (ALIGN) capsule Take 1 capsule by mouth daily.      . Calcium Carbonate-Vitamin D (CALCIUM + D PO) Take 1,200 mg by mouth.        . ciprofloxacin (CIPRO) 500 MG tablet Take 1 tablet (500 mg total) by mouth 2 (two) times daily.  20 tablet  0  . co-enzyme Q-10 30 MG capsule Take 30 mg by mouth 2 (two) times daily.       Marland Kitchen ESTRING 2 MG vaginal ring as directed.      . ezetimibe (ZETIA) 10 MG tablet Take 1 tablet (10 mg total) by mouth daily.  90 tablet  0  . metoprolol (LOPRESSOR) 50 MG tablet Take 0.5 tablets (25 mg total) by mouth 2 (two) times daily.  90 tablet  3  .  Multiple Vitamins-Minerals (CENTRUM SILVER PO) Take 1 tablet by mouth daily.       . Omega-3 Fatty Acids (OMEGA 3 PO) Take 2 tablets by mouth daily.       Marland Kitchen omeprazole (PRILOSEC) 40 MG capsule Take 1 capsule (40 mg total) by mouth daily.  90 capsule  2    Allergies  Allergen Reactions  . Crestor (Rosuvastatin Calcium) Other (See Comments)    Shoulder pain  . Lipitor (Atorvastatin Calcium) Other (See Comments)    Myalgias   . Pravastatin Other (See Comments)    myalgias  . Penicillins Itching    Patient Active Problem List  Diagnosis  . HYPERLIPIDEMIA  . ANXIETY  . DEPRESSION  . EUSTACHIAN TUBE DYSFUNCTION, LEFT  . HYPERTENSION  . ALLERGIC RHINITIS  . GERD  . DIVERTICULOSIS, COLON  . IBS  . OSTEOPENIA  . BREAST CANCER, HX OF  . COLONIC POLYPS, HX OF  . Palpitations  . Supraventricular tachycardia  . Osteoarthritis, knee  . History of breast cancer  . Malaise and fatigue  . Diverticulitis of large intestine with perforation  . Preventative health care  . Family hx of colon cancer  . Abdominal pain, lower    History  Smoking status  . Never Smoker   Smokeless tobacco  . Never Used  History  Alcohol Use  . 0.5 oz/week  . 1 drink(s) per week    Family History  Problem Relation Age of Onset  . Heart attack Father   . Heart failure Sister   . Colon cancer Brother     died/age 29    Review of Systems: Constitutional: no fever chills diaphoresis or fatigue or change in weight.  Head and neck: no hearing loss, no epistaxis, no photophobia or visual disturbance. Respiratory: No cough, shortness of breath or wheezing. Cardiovascular: No chest pain peripheral edema, palpitations. Gastrointestinal: No abdominal distention, no abdominal pain, no change in bowel habits hematochezia or melena. Genitourinary: No dysuria, no frequency, no urgency, no nocturia. Musculoskeletal:No arthralgias, no back pain, no gait disturbance or myalgias. Neurological: No  dizziness, no headaches, no numbness, no seizures, no syncope, no weakness, no tremors. Hematologic: No lymphadenopathy, no easy bruising. Psychiatric: No confusion, no hallucinations, no sleep disturbance.    Physical Exam: Filed Vitals:   08/05/12 0958  BP: 138/78  Pulse: 80   the general appearance feels a well-developed well-nourished woman in no distress.The head and neck exam reveals pupils equal and reactive.  Extraocular movements are full.  There is no scleral icterus.  The mouth and pharynx are normal.  The neck is supple.  The carotids reveal no bruits.  The jugular venous pressure is normal.  The  thyroid is not enlarged.  There is no lymphadenopathy.  The chest is clear to percussion and auscultation.  There are no rales or rhonchi.  Expansion of the chest is symmetrical.  The precordium is quiet.  The first heart sound is normal.  The second heart sound is physiologically split.  There is no murmur gallop rub or click.  There is no abnormal lift or heave.  The abdomen is soft and nontender.  The bowel sounds are normal.  The liver and spleen are not enlarged.  There are no abdominal masses.  There are no abdominal bruits.  Extremities reveal good pedal pulses.  There is no phlebitis or edema.  There is no cyanosis or clubbing.  Strength is normal and symmetrical in all extremities.  There is no lateralizing weakness.  There are no sensory deficits.  The skin is warm and dry.  There is no rash.   EKG today shows normal sinus rhythm and no ischemic changes  Assessment / Plan:  Continue same medication.  Recheck in 6 months for followup office visit lipid panel hepatic function panel and basal metabolic panel

## 2012-08-08 ENCOUNTER — Other Ambulatory Visit: Payer: Self-pay | Admitting: Cardiology

## 2012-08-10 NOTE — Telephone Encounter (Signed)
..   Requested Prescriptions   Pending Prescriptions Disp Refills  . omeprazole (PRILOSEC) 40 MG capsule [Pharmacy Med Name: OMEPRAZOLE   40MG ] 270 capsule 0    Sig: TAKE ONE CAPSULE BY MOUTH EVERY DAY

## 2012-08-11 ENCOUNTER — Ambulatory Visit (INDEPENDENT_AMBULATORY_CARE_PROVIDER_SITE_OTHER): Payer: Medicare Other | Admitting: Internal Medicine

## 2012-08-11 ENCOUNTER — Encounter: Payer: Self-pay | Admitting: Internal Medicine

## 2012-08-11 VITALS — BP 110/66 | HR 92 | Ht 61.5 in | Wt 150.0 lb

## 2012-08-11 DIAGNOSIS — K5732 Diverticulitis of large intestine without perforation or abscess without bleeding: Secondary | ICD-10-CM

## 2012-08-11 DIAGNOSIS — K589 Irritable bowel syndrome without diarrhea: Secondary | ICD-10-CM

## 2012-08-11 DIAGNOSIS — K5720 Diverticulitis of large intestine with perforation and abscess without bleeding: Secondary | ICD-10-CM

## 2012-08-11 DIAGNOSIS — K5792 Diverticulitis of intestine, part unspecified, without perforation or abscess without bleeding: Secondary | ICD-10-CM

## 2012-08-11 DIAGNOSIS — Z800 Family history of malignant neoplasm of digestive organs: Secondary | ICD-10-CM

## 2012-08-11 NOTE — Patient Instructions (Addendum)
We would like for you to please keep a diary to include date, time, level of pain , episodes of diarrhea , any stressful events going on.  If you have any diverticulitis pain call us back.  Thank you for choosing me and [facility].  Gatha Mayer, M.D., [facility]

## 2012-08-11 NOTE — Progress Notes (Signed)
  Subjective:    Patient ID: Leslie Bryant, female    DOB: 1941/02/07, 71 y.o.   MRN: [id number]  HPI This is a very nice elderly white woman who developed diverticulitis with an abscess early in 2013. She was cared for by the surgeons and had an abscess drained percutaneously. Followup CT scanning showed resolution of her problems. She had done well until late September when she developed recurrent left lower quadrant pain and Mueller to but not as severe as the pain she had an early 2013. Dr. Jenny Reichmann prescribed Cipro and metronidazole and the symptoms resolved.  She describes a longer history, for several years of twice a month having a normal bowel movement and then progressively more loose over time. This might last for an hour or so only. She cannot identify triggers but she wonders why it occurs. She recently began to use some peppermint oil capsules and says that she feels better with less gas and bloating. In between these episodes of loose stools she says she has normal bowel movements and no problems. There is no bleeding or weight loss. Her last routine colonoscopy was in 2010 in the next would be due in 2015. She has a brother with a history of colon cancer and she has had colon polyps in the past. Medications, allergies, past medical history, past surgical history, family history and social history are reviewed and updated in the EMR.  Review of Systems positive for arthritis and insomnia all other review of systems negative.    Objective:   Physical Exam General:  Well-developed, well-nourished and in no acute distress Eyes:  anicteric. ENT:   Mouth and posterior pharynx free of lesions.  Neck:   supple w/o thyromegaly or mass.  Lungs: Clear to auscultation bilaterally. Heart:  S1S2, no rubs, murmurs, gallops. Abdomen:  soft, mildly tender in LLQ to deep palpation, no hepatosplenomegaly, hernia, or mass and BS+.  Lymph:  no cervical or supraclavicular adenopathy. Extremities:   no  edema Skin   no rash. Neuro:  A&O x 3.  Psych:  appropriate mood and  Affect.   Data Reviewed: Lab Results  Component Value Date   WBC 14.2* 07/23/2012   HGB 12.8 07/23/2012   HCT 38.7 07/23/2012   MCV 95.3 07/23/2012   PLT 274.0 07/23/2012   CT scans from 2013 - hosptalization records       Assessment & Plan:   1. Diverticulitis  - appears resolved - would be second episode - hold on imaging I think. If recurrent problems likely needs CT scan again given complicated history   2. IBS (irritable bowel syndrome)  - keep a diary of her sxs and previous 24 hours of food intake, any stressful events   3. Family history of colon cancer  - colonoscopy 2015   CC: [name], MD and Johnathan Hausen, MD

## 2012-08-13 ENCOUNTER — Other Ambulatory Visit: Payer: Self-pay | Admitting: Obstetrics and Gynecology

## 2012-08-25 ENCOUNTER — Encounter: Payer: Self-pay | Admitting: Internal Medicine

## 2012-08-25 ENCOUNTER — Ambulatory Visit (INDEPENDENT_AMBULATORY_CARE_PROVIDER_SITE_OTHER): Payer: Medicare Other | Admitting: Internal Medicine

## 2012-08-25 VITALS — BP 120/70 | HR 86 | Temp 98.5°F | Ht 61.5 in | Wt 152.1 lb

## 2012-08-25 DIAGNOSIS — I10 Essential (primary) hypertension: Secondary | ICD-10-CM

## 2012-08-25 DIAGNOSIS — IMO0002 Reserved for concepts with insufficient information to code with codable children: Secondary | ICD-10-CM

## 2012-08-25 DIAGNOSIS — F329 Major depressive disorder, single episode, unspecified: Secondary | ICD-10-CM

## 2012-08-25 DIAGNOSIS — M1710 Unilateral primary osteoarthritis, unspecified knee: Secondary | ICD-10-CM

## 2012-08-25 MED ORDER — TRAMADOL HCL 50 MG PO TABS: 50.0000 mg | ORAL_TABLET | Freq: Four times a day (QID) | ORAL | Status: DC | PRN

## 2012-08-25 MED ORDER — DICLOFENAC SODIUM 1.5 % TD SOLN: TRANSDERMAL | Status: DC

## 2012-08-25 MED ORDER — CELECOXIB 200 MG PO CAPS: 200.0000 mg | ORAL_CAPSULE | Freq: Every day | ORAL | Status: DC | PRN

## 2012-08-25 NOTE — Patient Instructions (Addendum)
Take all new medications as prescribed, except you may not need the penssaid when taking the celebrex Continue all other medications as before Please keep your appointments with your specialists as you have planned - Dr Gladstone Lighter as you have discussed Please return in July 2014 for your yearly visit, or sooner if needed, with Lab testing done 3-5 days before

## 2012-08-25 NOTE — Progress Notes (Signed)
Subjective:    Patient ID: Leslie Bryant, female    DOB: 20-Jul-1941, 71 y.o.   MRN: [id number]  HPI  Here to f/u; has seen Dr Londell Moh since last seen here, with MRI and now known left medial meniscus tear, but his opinion was the majority of the pain was most likely related to the DJD bilat knees. No surgury planned.  Was taking naproxen in the past, but had dark stools, so stopped, stools cleared up.  Tramadol per Dr Gladstone Lighter has not helped.  Still trying to be active, work in the yard, doing everyday things, but pain is too severe to stand still and walking is severe as well up to 7-8/10.  Left knee pain worse then right.  Trying to hold off on going back to Dr Gladstone Lighter for meniscal surgury. Pain radiates to the lower legs, Does not want hydrocodone or oxcycodone.  Denies worsening depressive symptoms, suicidal ideation, or panic. Pt denies chest pain, increased sob or doe, wheezing, orthopnea, PND, increased LE swelling, palpitations, dizziness or syncope.  Pt denies new neurological symptoms such as new headache, or facial or extremity weakness or numbness   Pt denies polydipsia, polyuria.  Pt states overall good compliance with meds, trying to follow lower cholesterol diet Past Medical History  Diagnosis Date  . Palpitations   . Supraventricular tachycardia   . Hypercholesterolemia   . Broken humerus     Right  . Labile hypertension   . GERD (gastroesophageal reflux disease)   . Chest pain, atypical   . Depression   . Gall bladder stones   . Ulcer     History of ulcer  . Headache   . Anxiety   . Arthritis   . Adenomatous colon polyp 1988  . Diverticulosis of colon     sigmoid  . Internal hemorrhoids   . History of hiatal hernia     sliding  . Gastritis and duodenitis    Past Surgical History  Procedure Date  . Mastectomy 1986    Right  . Cholecystectomy   . Rt knee surgery 04/2-12  . Breast surgery   . Abdominal hysterectomy   . Colonoscopy w/ biopsies     multiple  .  Panendoscopy 07/24/1995  . Esophagogastroduodenoscopy     reports that she has never smoked. She has never used smokeless tobacco. She reports that she drinks about .5 ounces of alcohol per week. She reports that she does not use illicit drugs. family history includes Brain cancer in her sister; Cancer in her brother; Colon cancer in her brother; Heart attack in her father; Heart failure in her sister; Leukemia in her brother; and Lung cancer in her sister. Allergies  Allergen Reactions  . Crestor (Rosuvastatin Calcium) Other (See Comments)    Shoulder pain  . Lipitor (Atorvastatin Calcium) Other (See Comments)    Myalgias   . Pravastatin Other (See Comments)    myalgias  . Penicillins Itching   Current Outpatient Prescriptions on File Prior to Visit  Medication Sig Dispense Refill  . aspirin EC 81 MG tablet Take 81 mg by mouth daily.        . bifidobacterium infantis (ALIGN) capsule Take 1 capsule by mouth daily.      . Calcium Carbonate-Vitamin D (CALCIUM + D PO) Take 1,200 mg by mouth.        . co-enzyme Q-10 30 MG capsule Take 30 mg by mouth 2 (two) times daily.       . Docusate Calcium (STOOL  SOFTENER PO) Take 1 capsule by mouth daily.      Marland Kitchen ESTRING 2 MG vaginal ring as directed.      . ezetimibe (ZETIA) 10 MG tablet Take 1 tablet (10 mg total) by mouth daily.  90 tablet  0  . metoprolol (LOPRESSOR) 50 MG tablet Take 0.5 tablets (25 mg total) by mouth 2 (two) times daily.  90 tablet  3  . Multiple Vitamins-Minerals (CENTRUM SILVER PO) Take 1 tablet by mouth daily.       . Omega-3 Fatty Acids (OMEGA 3 PO) Take 2 tablets by mouth daily.       Marland Kitchen omeprazole (PRILOSEC) 40 MG capsule TAKE ONE CAPSULE BY MOUTH EVERY DAY  270 capsule  0  . peppermint oil liquid 1 capsule by Does not apply route daily.        Review of Systems  Constitutional: Negative for diaphoresis and unexpected weight change.  HENT: Negative for tinnitus.   Eyes: Negative for photophobia and visual disturbance.    Respiratory: Negative for choking and stridor.   Gastrointestinal: Negative for vomiting and blood in stool.  Genitourinary: Negative for hematuria and decreased urine volume.  Musculoskeletal: Negative for gait problem.  Skin: Negative for color change and wound.  Neurological: Negative for tremors and numbness.  Psychiatric/Behavioral: Negative for decreased concentration. The patient is not hyperactive.       Objective:   Physical Exam BP 120/70  Pulse 86  Temp 98.5 F (36.9 C) (Oral)  Ht 5' 1.5" (1.562 m)  Wt 152 lb 2 oz (69.003 kg)  BMI 28.28 kg/m2  SpO2 95% Physical Exam  VS noted Constitutional: Pt appears well-developed and well-nourished.  HENT: Head: Normocephalic.  Right Ear: External ear normal.  Left Ear: External ear normal.  Eyes: Conjunctivae and EOM are normal. Pupils are equal, round, and reactive to light.  Neck: Normal range of motion. Neck supple.  Cardiovascular: Normal rate and regular rhythm.   Pulmonary/Chest: Effort normal and breath sounds normal.  Neurological: Pt is alert. Not confused  Skin: Skin is warm. No erythema.  Psychiatric: Pt behavior is normal. Thought content normal.  Bilat knee crepitus noted with small left effusion, decreaed ROM bilat    Assessment & Plan:

## 2012-08-30 ENCOUNTER — Encounter: Payer: Self-pay | Admitting: Internal Medicine

## 2012-08-30 NOTE — Assessment & Plan Note (Signed)
stable overall by hx and exam, most recent data reviewed with pt, and pt to continue medical treatment as before BP Readings from Last 3 Encounters:  08/25/12 120/70  08/11/12 110/66  08/05/12 138/78

## 2012-08-30 NOTE — Assessment & Plan Note (Signed)
Mild increased recently, for pennsaid topical, cont other med,  to f/u any worsening symptoms or concerns

## 2012-08-30 NOTE — Assessment & Plan Note (Signed)
stable overall by hx and exam, most recent data reviewed with pt, and pt to continue medical treatment as before Lab Results  Component Value Date   WBC 14.2* 07/23/2012   HGB 12.8 07/23/2012   HCT 38.7 07/23/2012   PLT 274.0 07/23/2012   GLUCOSE 109* 07/23/2012   CHOL 178 08/05/2012   TRIG 99.0 08/05/2012   HDL 55.20 08/05/2012   LDLDIRECT 175.8 02/06/2011   LDLCALC 103* 08/05/2012   ALT 25 07/23/2012   AST 19 07/23/2012   NA 136 07/23/2012   K 4.1 07/24/2012   CL 102 07/23/2012   CREATININE 0.9 07/23/2012   BUN 13 07/23/2012   CO2 28 07/23/2012   TSH 0.92 05/05/2012   INR 1.03 11/07/2011

## 2012-09-04 ENCOUNTER — Encounter (HOSPITAL_BASED_OUTPATIENT_CLINIC_OR_DEPARTMENT_OTHER): Payer: Self-pay | Admitting: *Deleted

## 2012-09-04 ENCOUNTER — Other Ambulatory Visit: Payer: Self-pay

## 2012-09-04 DIAGNOSIS — M1710 Unilateral primary osteoarthritis, unspecified knee: Secondary | ICD-10-CM

## 2012-09-04 MED ORDER — CELECOXIB 200 MG PO CAPS: 200.0000 mg | ORAL_CAPSULE | Freq: Every day | ORAL | Status: DC | PRN

## 2012-09-04 NOTE — Progress Notes (Signed)
NPO AFTER MN. ARRIVES AT 1030. NEEDS ISTAT. CURRENT EKG TO BE FAXED FROM DR BRACKBILL. WILL TAKE PRILOSEC , METOPROLOL AND ALIGN AM OF SURG W/ SIP OF WATER.

## 2012-09-08 ENCOUNTER — Encounter (HOSPITAL_BASED_OUTPATIENT_CLINIC_OR_DEPARTMENT_OTHER): Payer: Self-pay | Admitting: Anesthesiology

## 2012-09-08 ENCOUNTER — Ambulatory Visit (HOSPITAL_BASED_OUTPATIENT_CLINIC_OR_DEPARTMENT_OTHER): Payer: Medicare Other | Admitting: Anesthesiology

## 2012-09-08 ENCOUNTER — Ambulatory Visit (HOSPITAL_BASED_OUTPATIENT_CLINIC_OR_DEPARTMENT_OTHER)
Admission: RE | Admit: 2012-09-08 | Discharge: 2012-09-08 | Disposition: A | Discharge status: Home / Self Care | Payer: Medicare Other | Source: Ambulatory Visit | Attending: Orthopedic Surgery | Admitting: Orthopedic Surgery

## 2012-09-08 ENCOUNTER — Encounter (HOSPITAL_BASED_OUTPATIENT_CLINIC_OR_DEPARTMENT_OTHER)
Admission: RE | Disposition: A | Discharge status: Home / Self Care | Payer: Self-pay | Source: Ambulatory Visit | Attending: Orthopedic Surgery

## 2012-09-08 ENCOUNTER — Encounter (HOSPITAL_BASED_OUTPATIENT_CLINIC_OR_DEPARTMENT_OTHER): Payer: Self-pay

## 2012-09-08 DIAGNOSIS — M1712 Unilateral primary osteoarthritis, left knee: Secondary | ICD-10-CM | POA: Diagnosis present

## 2012-09-08 DIAGNOSIS — I10 Essential (primary) hypertension: Secondary | ICD-10-CM | POA: Insufficient documentation

## 2012-09-08 DIAGNOSIS — Z79899 Other long term (current) drug therapy: Secondary | ICD-10-CM | POA: Insufficient documentation

## 2012-09-08 DIAGNOSIS — IMO0002 Reserved for concepts with insufficient information to code with codable children: Secondary | ICD-10-CM | POA: Insufficient documentation

## 2012-09-08 DIAGNOSIS — E7800 Pure hypercholesterolemia, unspecified: Secondary | ICD-10-CM | POA: Insufficient documentation

## 2012-09-08 DIAGNOSIS — X500XXA Overexertion from strenuous movement or load, initial encounter: Secondary | ICD-10-CM | POA: Insufficient documentation

## 2012-09-08 DIAGNOSIS — K219 Gastro-esophageal reflux disease without esophagitis: Secondary | ICD-10-CM | POA: Insufficient documentation

## 2012-09-08 DIAGNOSIS — S83232A Complex tear of medial meniscus, current injury, left knee, initial encounter: Secondary | ICD-10-CM | POA: Diagnosis present

## 2012-09-08 DIAGNOSIS — M1710 Unilateral primary osteoarthritis, unspecified knee: Secondary | ICD-10-CM | POA: Insufficient documentation

## 2012-09-08 HISTORY — DX: Pain in left knee: M25.562

## 2012-09-08 HISTORY — DX: Effusion, left knee: M25.462

## 2012-09-08 HISTORY — DX: Unspecified tear of unspecified meniscus, current injury, unspecified knee, initial encounter: S83.209A

## 2012-09-08 HISTORY — PX: CHONDROPLASTY: SHX5177

## 2012-09-08 HISTORY — PX: KNEE ARTHROSCOPY WITH MEDIAL MENISECTOMY: SHX5651

## 2012-09-08 HISTORY — DX: Personal history of other diseases of the digestive system: Z87.19

## 2012-09-08 LAB — POCT I-STAT 4, (NA,K, GLUC, HGB,HCT)
Hemoglobin: 14.3 g/dL (ref 12.0–15.0)
Sodium: 136 mEq/L (ref 135–145)

## 2012-09-08 SURGERY — ARTHROSCOPY, KNEE, WITH MEDIAL MENISCECTOMY
Anesthesia: General | Site: Knee | Laterality: Left | Wound class: Clean

## 2012-09-08 MED ORDER — ONDANSETRON HCL 4 MG/2ML IJ SOLN
INTRAMUSCULAR | Status: DC | PRN
  Administered 2012-09-08: 4 mg via INTRAVENOUS

## 2012-09-08 MED ORDER — BACITRACIN-NEOMYCIN-POLYMYXIN 400-5-5000 EX OINT
TOPICAL_OINTMENT | CUTANEOUS | Status: DC | PRN
  Administered 2012-09-08: 1 via TOPICAL

## 2012-09-08 MED ORDER — OXYCODONE-ACETAMINOPHEN 10-650 MG PO TABS: 1.0000 | ORAL_TABLET | ORAL | Status: DC | PRN

## 2012-09-08 MED ORDER — LACTATED RINGERS IV SOLN
INTRAVENOUS | Status: DC
  Administered 2012-09-08 (×2): via INTRAVENOUS
  Filled 2012-09-08: qty 1000

## 2012-09-08 MED ORDER — SODIUM CHLORIDE 0.9 % IR SOLN
Status: DC | PRN
  Administered 2012-09-08: 9000 mL

## 2012-09-08 MED ORDER — FENTANYL CITRATE 0.05 MG/ML IJ SOLN
25.0000 ug | INTRAMUSCULAR | Status: DC | PRN
  Administered 2012-09-08 (×3): 0.25 ug via INTRAVENOUS
  Filled 2012-09-08: qty 1

## 2012-09-08 MED ORDER — DEXAMETHASONE SODIUM PHOSPHATE 4 MG/ML IJ SOLN
INTRAMUSCULAR | Status: DC | PRN
  Administered 2012-09-08: 8 mg via INTRAVENOUS

## 2012-09-08 MED ORDER — OXYCODONE-ACETAMINOPHEN 5-325 MG PO TABS
2.0000 | ORAL_TABLET | ORAL | Status: DC | PRN
  Administered 2012-09-08: 2 via ORAL
  Filled 2012-09-08: qty 2

## 2012-09-08 MED ORDER — FENTANYL CITRATE 0.05 MG/ML IJ SOLN
INTRAMUSCULAR | Status: DC | PRN
  Administered 2012-09-08 (×2): 50 ug via INTRAVENOUS
  Administered 2012-09-08 (×2): 25 ug via INTRAVENOUS
  Administered 2012-09-08: 50 ug via INTRAVENOUS

## 2012-09-08 MED ORDER — CHLORHEXIDINE GLUCONATE 4 % EX LIQD
60.0000 mL | Freq: Once | CUTANEOUS | Status: DC
  Filled 2012-09-08: qty 60

## 2012-09-08 MED ORDER — BUPIVACAINE-EPINEPHRINE 0.25% -1:200000 IJ SOLN
INTRAMUSCULAR | Status: DC | PRN
  Administered 2012-09-08: 30 mL

## 2012-09-08 MED ORDER — MIDAZOLAM HCL 5 MG/5ML IJ SOLN
INTRAMUSCULAR | Status: DC | PRN
  Administered 2012-09-08: 1 mg via INTRAVENOUS

## 2012-09-08 MED ORDER — PHENYLEPHRINE HCL 10 MG/ML IJ SOLN
INTRAMUSCULAR | Status: DC | PRN
  Administered 2012-09-08 (×3): 100 ug via INTRAVENOUS

## 2012-09-08 MED ORDER — LIDOCAINE HCL (CARDIAC) 20 MG/ML IV SOLN
INTRAVENOUS | Status: DC | PRN
  Administered 2012-09-08: 60 mg via INTRAVENOUS

## 2012-09-08 MED ORDER — PROPOFOL 10 MG/ML IV BOLUS
INTRAVENOUS | Status: DC | PRN
  Administered 2012-09-08: 150 mg via INTRAVENOUS

## 2012-09-08 MED ORDER — LACTATED RINGERS IV SOLN
INTRAVENOUS | Status: DC
  Filled 2012-09-08: qty 1000

## 2012-09-08 MED ORDER — PROMETHAZINE HCL 25 MG/ML IJ SOLN
6.2500 mg | INTRAMUSCULAR | Status: DC | PRN
  Filled 2012-09-08: qty 1

## 2012-09-08 MED ORDER — KETOROLAC TROMETHAMINE 30 MG/ML IJ SOLN
15.0000 mg | Freq: Once | INTRAMUSCULAR | Status: DC | PRN
  Filled 2012-09-08: qty 1

## 2012-09-08 MED ORDER — CLINDAMYCIN PHOSPHATE 900 MG/50ML IV SOLN
900.0000 mg | INTRAVENOUS | Status: AC
  Administered 2012-09-08: 900 mg via INTRAVENOUS
  Filled 2012-09-08: qty 50

## 2012-09-08 SURGICAL SUPPLY — 47 items
BANDAGE ELASTIC 4 VELCRO ST LF (GAUZE/BANDAGES/DRESSINGS) ×2 IMPLANT
BANDAGE ELASTIC 6 VELCRO ST LF (GAUZE/BANDAGES/DRESSINGS) ×2 IMPLANT
BLADE CUDA 5.5 (BLADE) IMPLANT
BLADE CUTTER GATOR 3.5 (BLADE) IMPLANT
BLADE CUTTER MENIS 5.5 (BLADE) IMPLANT
BLADE GREAT WHITE 4.2 (BLADE) ×2 IMPLANT
BLADE SURG 15 STRL LF DISP TIS (BLADE) IMPLANT
BLADE SURG 15 STRL SS (BLADE)
BNDG COHESIVE 6X5 TAN NS LF (GAUZE/BANDAGES/DRESSINGS) ×2 IMPLANT
CANISTER SUCT LVC 12 LTR MEDI- (MISCELLANEOUS) ×2 IMPLANT
CANISTER SUCTION 2500CC (MISCELLANEOUS) ×2 IMPLANT
CLOTH BEACON ORANGE TIMEOUT ST (SAFETY) ×2 IMPLANT
DRAPE ARTHROSCOPY W/POUCH 114 (DRAPES) ×2 IMPLANT
DRAPE LG THREE QUARTER DISP (DRAPES) ×2 IMPLANT
DRSG EMULSION OIL 3X3 NADH (GAUZE/BANDAGES/DRESSINGS) ×2 IMPLANT
DRSG PAD ABDOMINAL 8X10 ST (GAUZE/BANDAGES/DRESSINGS) ×4 IMPLANT
DURAPREP 26ML APPLICATOR (WOUND CARE) ×2 IMPLANT
ELECT MENISCUS 165MM 90D (ELECTRODE) IMPLANT
ELECT REM PT RETURN 9FT ADLT (ELECTROSURGICAL)
ELECTRODE REM PT RTRN 9FT ADLT (ELECTROSURGICAL) IMPLANT
GELFOAM SMALL 12 7MM 315 3 (MISCELLANEOUS) IMPLANT
GLOVE BIO SURGEON STRL SZ 6.5 (GLOVE) ×1 IMPLANT
GLOVE ECLIPSE 6.0 STRL STRAW (GLOVE) ×1 IMPLANT
GLOVE ECLIPSE 8.0 STRL XLNG CF (GLOVE) ×2 IMPLANT
GLOVE INDICATOR 8.5 STRL (GLOVE) ×3 IMPLANT
GLOVE SURG ORTHO 6.0 STRL STRW (GLOVE) ×2 IMPLANT
GOWN PREVENTION PLUS XLARGE (GOWN DISPOSABLE) ×1 IMPLANT
GOWN STRL NON-REIN LRG LVL3 (GOWN DISPOSABLE) ×1 IMPLANT
GOWN W/COTTON TOWEL STD LRG (GOWNS) ×1 IMPLANT
GOWN XL W/COTTON TOWEL STD (GOWNS) ×1 IMPLANT
IV NS IRRIG 3000ML ARTHROMATIC (IV SOLUTION) ×3 IMPLANT
KNEE WRAP E Z 3 GEL PACK (MISCELLANEOUS) ×2 IMPLANT
MINI VAC (SURGICAL WAND) ×1 IMPLANT
PACK ARTHROSCOPY DSU (CUSTOM PROCEDURE TRAY) ×2 IMPLANT
PACK BASIN DAY SURGERY FS (CUSTOM PROCEDURE TRAY) ×2 IMPLANT
PADDING CAST ABS 4INX4YD NS (CAST SUPPLIES) ×1
PADDING CAST ABS COTTON 4X4 ST (CAST SUPPLIES) IMPLANT
PADDING CAST COTTON 6X4 STRL (CAST SUPPLIES) ×2 IMPLANT
PENCIL BUTTON HOLSTER BLD 10FT (ELECTRODE) IMPLANT
SET ARTHROSCOPY TUBING (MISCELLANEOUS) ×2
SET ARTHROSCOPY TUBING LN (MISCELLANEOUS) ×1 IMPLANT
SPONGE GAUZE 4X4 12PLY (GAUZE/BANDAGES/DRESSINGS) ×2 IMPLANT
SUT ETHILON 3 0 PS 1 (SUTURE) ×2 IMPLANT
SYR CONTROL 10ML LL (SYRINGE) IMPLANT
TOWEL OR 17X24 6PK STRL BLUE (TOWEL DISPOSABLE) ×2 IMPLANT
WAND 90 DEG TURBOVAC W/CORD (SURGICAL WAND) ×2 IMPLANT
WATER STERILE IRR 500ML POUR (IV SOLUTION) ×2 IMPLANT

## 2012-09-08 NOTE — Brief Op Note (Signed)
09/08/2012  1:37 PM  PATIENT:  Leslie Bryant  71 y.o. female  PRE-OPERATIVE DIAGNOSIS:  left knee medial meniscus tear and osteoarthritis  POST-OPERATIVE DIAGNOSIS:  left knee medial meniscus tear and osteoarthritis  PROCEDURE:  Procedure(s) (LRB) with comments: KNEE ARTHROSCOPY WITH MEDIAL MENISECTOMY (Left) - lateral tibial plateau,  CHONDROPLASTY (Left)and Abraision Crondroplasty of Patella and Medial Femoral Condyle and Lateral Tibial Plateau,and Synovectomy of Suprapatellar Pouch.  SURGEON:  Surgeon(s) and Role:    * Tobi Bastos, MD - Primary   ASSISTANTS:OR Tech  ANESTHESIA:   general  EBL:  Total I/O In: 200 [I.V.:200] Out: -   BLOOD ADMINISTERED:none  DRAINS: none   LOCAL MEDICATIONS USED:  MARCAINE 30cc 31f 0.25%.    SPECIMEN:  No Specimen  DISPOSITION OF SPECIMEN:  N/A  COUNTS:  YES  TOURNIQUET:  * No tourniquets in log *  DICTATION: .Other Dictation: Dictation Number [id number]  PLAN OF CARE: Discharge to home after PACU  PATIENT DISPOSITION:  PACU - hemodynamically stable.   Delay start of Pharmacological VTE agent (>24hrs) due to surgical blood loss or risk of bleeding: yes

## 2012-09-08 NOTE — Interval H&P Note (Signed)
History and Physical Interval Note:  09/08/2012 12:37 PM  Leslie Bryant  has presented today for surgery, with the diagnosis of left knee medial meniscus tear   The various methods of treatment have been discussed with the patient and family. After consideration of risks, benefits and other options for treatment, the patient has consented to  Procedure(s) (LRB) with comments: KNEE ARTHROSCOPY WITH MEDIAL MENISECTOMY (Left) as a surgical intervention .  The patient's history has been reviewed, patient examined, no change in status, stable for surgery.  I have reviewed the patient's chart and labs.  Questions were answered to the patient's satisfaction.     Justis Closser A

## 2012-09-08 NOTE — Anesthesia Preprocedure Evaluation (Signed)
Anesthesia Evaluation  Patient identified by MRN, date of birth, ID band Patient awake    Reviewed: Allergy & Precautions, H&P , NPO status , Patient's Chart, lab work & pertinent test results  Airway Mallampati: II TM Distance: >3 FB Neck ROM: Full    Dental No notable dental hx.    Pulmonary neg pulmonary ROS,  breath sounds clear to auscultation  Pulmonary exam normal       Cardiovascular negative cardio ROS  + dysrhythmias Supra Ventricular Tachycardia Rhythm:Regular Rate:Normal     Neuro/Psych negative neurological ROS  negative psych ROS   GI/Hepatic negative GI ROS, Neg liver ROS,   Endo/Other  negative endocrine ROS  Renal/GU negative Renal ROS  negative genitourinary   Musculoskeletal negative musculoskeletal ROS (+)   Abdominal   Peds negative pediatric ROS (+)  Hematology negative hematology ROS (+)   Anesthesia Other Findings   Reproductive/Obstetrics negative OB ROS                           Anesthesia Physical Anesthesia Plan  ASA: II  Anesthesia Plan: General   Post-op Pain Management:    Induction: Intravenous  Airway Management Planned: LMA  Additional Equipment:   Intra-op Plan:   Post-operative Plan: Extubation in OR  Informed Consent: I have reviewed the patients History and Physical, chart, labs and discussed the procedure including the risks, benefits and alternatives for the proposed anesthesia with the patient or authorized representative who has indicated his/her understanding and acceptance.   Dental advisory given  Plan Discussed with: CRNA and Surgeon  Anesthesia Plan Comments:         Anesthesia Quick Evaluation

## 2012-09-08 NOTE — Transfer of Care (Signed)
Immediate Anesthesia Transfer of Care Note  Patient: Leslie Bryant  Procedure(s) Performed: Procedure(s) (LRB): KNEE ARTHROSCOPY WITH MEDIAL MENISECTOMY (Left) CHONDROPLASTY (Left)  Patient Location: PACU  Anesthesia Type: General  Level of Consciousness: awake, alert  and oriented  Airway & Oxygen Therapy: Patient Spontanous Breathing and Patient connected to face mask oxygen  Post-op Assessment: Report given to PACU RN and Post -op Vital signs reviewed and stable  Post vital signs: Reviewed and stable  Complications: No apparent anesthesia complications

## 2012-09-08 NOTE — H&P (View-Only) (Signed)
NPO AFTER MN. ARRIVES AT 1030. NEEDS ISTAT. CURRENT EKG TO BE FAXED FROM DR BRACKBILL. WILL TAKE PRILOSEC , METOPROLOL AND ALIGN AM OF SURG W/ SIP OF WATER.

## 2012-09-08 NOTE — H&P (Signed)
Leslie Bryant is an 71 y.o. female.   Chief Complaint: left knee pain  HPI: Leslie Bryant is a 71 year old female who presented with the chief complaint of left knee pain in September. She had previous issues with the left knee due to osteoarthritis, but she presented after injury. She was leaning into her back seat of her car to get something when she twisted her knee. She immediately felt a pop in the knee. She also experienced swelling and reduced ROM in the next few days. She had an MRI which showed a torn medial meniscus in the left knee.   Past Medical History  Diagnosis Date  . Palpitations IRREGULAR HEARTBEAT--  CONTROLLED W/  BETA BLOCKER  . Hypercholesterolemia   . GERD (gastroesophageal reflux disease)   . Diverticulosis of colon     sigmoid  . H/O hiatal hernia   . Arthritis KNEES  . Hypertension   . Acute meniscal tear of knee LEFT KNEE  . Left knee pain   . Swelling of left knee joint     Past Surgical History  Procedure Date  . Cardiac catheterization 12-22-2000  DR BRACKBILL    NORMAL LVF/ NORMAL CORONARY ARTERIES  . Vaginal hysterectomy 1966  . Breast recontruction w/ implants 1987  . Mastectomy 1986    RIGHT BREAST W/ MULTIPLE NODE DISSECTION  . Cholecystectomy 1988  . Knee arthroscopy 2011    RIGHT KNEE    Family History  Problem Relation Age of Onset  . Heart attack Father   . Heart failure Sister   . Colon cancer Brother     died/age 32  . Lung cancer Sister   . Brain cancer Sister   . Cancer Brother     mouth  . Leukemia Brother    Social History:  reports that she has never smoked. She has never used smokeless tobacco. She reports that she drinks alcohol. She reports that she does not use illicit drugs.  Allergies:  Allergies  Allergen Reactions  . Crestor (Rosuvastatin Calcium) Other (See Comments)    Shoulder pain  . Lipitor (Atorvastatin Calcium) Other (See Comments)    Myalgias   . Pravastatin Other (See Comments)    myalgias  . Penicillins  Itching    Current outpatient prescriptions: aspirin EC 81 MG tablet, Take 81 mg by mouth daily. ,  bifidobacterium infantis (ALIGN) capsule, Take 1 capsule by mouth daily.,   Calcium Carbonate-Vitamin D (CALCIUM + D PO), Take 1,200 mg by mouth daily. ,    celecoxib (CELEBREX) 200 MG capsule, Take 200 mg by mouth daily.,  co-enzyme Q-10 30 MG capsule, Take 30 mg by mouth 2 (two) times daily. ,  Docusate Calcium (STOOL SOFTENER PO), Take 1 capsule by mouth daily.,  ESTRING 2 MG vaginal ring, as directed.,  ezetimibe (ZETIA) 10 MG tablet, Take 1 tablet (10 mg total) by mouth daily., Disp: 90 tablet, Rfl: 0 metoprolol (LOPRESSOR) 50 MG tablet, Take 0.5 tablets (25 mg total) by mouth 2 (two) times daily., Disp: 90 tablet, Rfl: 3;  Multiple Vitamins-Minerals (CENTRUM SILVER PO), Take 1 tablet by mouth daily. ,   Omega-3 Fatty Acids (OMEGA 3 PO), Take 2 tablets by mouth daily. ,  omeprazole (PRILOSEC) 40 MG capsule, Take 40 mg by mouth every morning. ,  peppermint oil liquid, Take 1 capsule by mouth daily., Disp: , Rfl: ;  traMADol (ULTRAM) 50 MG tablet, Take 1 tablet (50 mg total) by mouth every 6 (six) hours as needed for pain.,  Disp: 120 tablet, Rfl: 1  Review of Systems  Constitutional: Negative.   HENT: Negative.  Negative for neck pain.   Eyes: Negative.   Respiratory: Negative.   Cardiovascular: Positive for palpitations and claudication. Negative for chest pain, orthopnea, leg swelling and PND.  Gastrointestinal: Negative.   Genitourinary: Negative.   Musculoskeletal: Positive for joint pain. Negative for myalgias, back pain and falls.       Left knee pain  Skin: Negative.   Neurological: Negative.   Endo/Heme/Allergies: Negative.   Psychiatric/Behavioral: Negative.     Height 5' 1.5" (1.562 m), weight 68.04 kg (150 lb).  Physical Exam  Constitutional: She is oriented to person, place, and time. She appears well-developed and well-nourished. No distress.  HENT:  Head:  Normocephalic and atraumatic.  Right Ear: External ear normal.  Left Ear: External ear normal.  Nose: Nose normal.  Mouth/Throat: Oropharynx is clear and moist.  Eyes: Conjunctivae normal and EOM are normal.  Neck: Normal range of motion. Neck supple. No thyromegaly present.  Cardiovascular: Normal rate, regular rhythm, normal heart sounds and intact distal pulses.   No murmur heard. Respiratory: Effort normal and breath sounds normal. No respiratory distress. She exhibits no tenderness.  GI: Soft. Bowel sounds are normal. She exhibits no distension and no mass. There is no tenderness.  Musculoskeletal:       Right hip: Normal.       Left hip: Normal.       Right knee: Normal.       Left knee: She exhibits decreased range of motion and effusion. She exhibits no deformity and no erythema. tenderness found. Medial joint line tenderness noted.       Legs: Lymphadenopathy:    She has no cervical adenopathy.  Neurological: She is alert and oriented to person, place, and time. She has normal strength. No sensory deficit.  Skin: No rash noted. She is not diaphoretic. No erythema.  Psychiatric: She has a normal mood and affect.     Assessment/Plan She has a torn medial meniscus in the left knee. She needs a left knee arthroscopy with medial menisectomy. This will most likely be an outpatient procedure barring any complications. Dr. Gladstone Lighter discussed the risks and benefits of the surgery with her pre-operatively.   Leslie Bryant LAUREN 09/08/2012, 7:18 AM

## 2012-09-08 NOTE — Anesthesia Postprocedure Evaluation (Signed)
  Anesthesia Post-op Note  Patient: Leslie Bryant  Procedure(s) Performed: Procedure(s) (LRB): KNEE ARTHROSCOPY WITH MEDIAL MENISECTOMY (Left) CHONDROPLASTY (Left)  Patient Location: PACU  Anesthesia Type: General  Level of Consciousness: awake and alert   Airway and Oxygen Therapy: Patient Spontanous Breathing  Post-op Pain: mild  Post-op Assessment: Post-op Vital signs reviewed, Patient's Cardiovascular Status Stable, Respiratory Function Stable, Patent Airway and No signs of Nausea or vomiting  Post-op Vital Signs: stable  Complications: No apparent anesthesia complications

## 2012-09-09 ENCOUNTER — Encounter (HOSPITAL_BASED_OUTPATIENT_CLINIC_OR_DEPARTMENT_OTHER): Payer: Self-pay | Admitting: Orthopedic Surgery

## 2012-09-09 NOTE — Op Note (Signed)
[name], Leslie Bryant NO.:  [id number]  MEDICAL RECORD NO.:  [id number]  LOCATION:                                 FACILITY:  PHYSICIAN:  Kipp Brood. Gladstone Lighter, M.D.     DATE OF BIRTH:  DATE OF PROCEDURE:  09/08/2012 DATE OF DISCHARGE:                              OPERATIVE REPORT   SURGEON:  Kipp Brood. Gladstone Lighter, MD  ASSISTANT:  Nurse.  PREOPERATIVE DIAGNOSES: 1. Complete bucket-handle tear, complex type of the medial meniscus of     the left knee. 2. Degenerative arthritis, left knee.  POSTOPERATIVE DIAGNOSES: 1. Complete bucket-handle tear, complex type of the medial meniscus of     the left knee. 2. Degenerative arthritis, left knee.  OPERATION: 1. Diagnostic arthroscopy, left knee. 2. Abrasion chondroplasty patella, left knee. 3. Abrasion chondroplasty, medial femoral condyle, left knee. 4. Abrasion chondroplasty, lateral tibial plateau, left knee. 5. Medial meniscectomy of a complex tear of the medial meniscus, left     knee. 6. Synovectomy, suprapatellar pouch, left knee.  PROCEDURE:  Under general anesthesia, routine orthopedic prep and draping of the left lower extremity was carried out.  She had 900 mg of Cleocin.  The appropriate time-out was carried out prior to surgery and I also marked the appropriate left leg in the holding area.  At this time, a small punctate incision was made in the suprapatellar pouch. Inflow cannula was inserted.  Knee was distended with saline.  Another small punctate incision was made in the anterolateral joint.  The arthroscope was entered from lateral approach and a complete diagnostic arthroscopy was carried out.  I went up into the suprapatellar pouch. She had chronic synovitis.  I utilized the [employer] and did a synovectomy.  At that time, identified rather severe chondromalacia of the patella.  I then did a abrasion chondroplasty of the patella.  I went down into the lateral joint.  The lateral meniscus was  intact. Lateral femoral condyle was intact but she did have chondromalacia of the lateral tibial plateau.  I then did a partial abrasion chondroplasty of the lateral tibial plateau.  The cruciates were intact.  I went over the medial joint, she had rather significant arthritic changes in the medial joint.  I did an abrasion chondroplasty of the medial femoral condyle and identified a large complex tear of the medial meniscus and did a partial medial meniscectomy.  I thoroughly irrigated out the knee, removed all the fluid, closed all 3 punctate incisions with 3-0 nylon suture.  I injected 30 mL of 0.25% Marcaine with epinephrine into the knee joint and a sterile Neosporin bundle dressing was applied.  The postop instructions will be given.  She will be on aspirin 325 mg b.i.d. today and b.i.d. for 2 weeks as an anticoagulant.          ______________________________ Kipp Brood. Gladstone Lighter, M.D.     RAG/MEDQ  D:  09/08/2012  T:  09/09/2012  Job:  [id number]  cc:   Darlin Coco, M.D. Fax: [phone]

## 2012-09-14 ENCOUNTER — Telehealth: Payer: Self-pay | Admitting: Cardiology

## 2012-09-14 ENCOUNTER — Telehealth: Payer: Self-pay | Admitting: Internal Medicine

## 2012-09-14 NOTE — Telephone Encounter (Signed)
Advised patient to call PCP per  Dr. Mare Ferrari

## 2012-09-14 NOTE — Telephone Encounter (Signed)
Patient Information:  Caller Name: Nakya  Phone: [phone]  Patient: Leslie Bryant, Leslie Bryant  Gender: Female  DOB: 1941-04-26  Age: 71 Years  PCP: Webb Silversmith   Symptoms  Reason For Call & Symptoms: UTI symptoms  Reviewed Health History In EMR: Yes  Reviewed Medications In EMR: Yes  Reviewed Allergies In EMR: Yes  Date of Onset of Symptoms: 09/13/2012  Treatments Tried: Cystex  Treatments Tried Worked: No  Guideline(s) Used:  Urination Pain - Female  Disposition Per Guideline:   See Today in Office  Reason For Disposition Reached:   Age > 50 years  Advice Given:  Call Back If:  You become worse.  Office Follow Up:  Does the office need to follow up with this patient?: Yes  Instructions For The Office: Appt need, no appts in EPIC RN Note:  Painful urination, lower abdominal pain, urinary frequency.

## 2012-09-14 NOTE — Telephone Encounter (Signed)
New Problem:    Patient called in because she is having an issue cystitus and would like Dr. Mare Ferrari to call in some medication for her.  Please call back.

## 2012-09-14 NOTE — Telephone Encounter (Signed)
Ok for regina Tuesday nov 12 if not already seen

## 2012-09-15 ENCOUNTER — Other Ambulatory Visit (INDEPENDENT_AMBULATORY_CARE_PROVIDER_SITE_OTHER): Payer: Medicare Other

## 2012-09-15 ENCOUNTER — Ambulatory Visit (INDEPENDENT_AMBULATORY_CARE_PROVIDER_SITE_OTHER): Payer: Medicare Other | Admitting: Internal Medicine

## 2012-09-15 ENCOUNTER — Encounter: Payer: Self-pay | Admitting: Internal Medicine

## 2012-09-15 VITALS — BP 100/68 | HR 111 | Temp 98.0°F | Ht 61.5 in | Wt 149.2 lb

## 2012-09-15 DIAGNOSIS — F329 Major depressive disorder, single episode, unspecified: Secondary | ICD-10-CM

## 2012-09-15 DIAGNOSIS — R300 Dysuria: Secondary | ICD-10-CM

## 2012-09-15 DIAGNOSIS — F3289 Other specified depressive episodes: Secondary | ICD-10-CM

## 2012-09-15 DIAGNOSIS — I10 Essential (primary) hypertension: Secondary | ICD-10-CM

## 2012-09-15 LAB — URINALYSIS, ROUTINE W REFLEX MICROSCOPIC
Specific Gravity, Urine: <=1.005 (ref 1.000–1.030)
Urine Glucose: NEGATIVE
Urobilinogen, UA: 0.2 (ref 0.0–1.0)
pH: 6 (ref 5.0–8.0)

## 2012-09-15 LAB — POCT URINALYSIS DIPSTICK
Bilirubin, UA: NEGATIVE
Glucose, UA: NEGATIVE
Ketones, UA: NEGATIVE
Nitrite, UA: NEGATIVE
Spec Grav, UA: 1.01

## 2012-09-15 MED ORDER — PHENAZOPYRIDINE HCL 100 MG PO TABS
100.0000 mg | ORAL_TABLET | Freq: Three times a day (TID) | ORAL | Status: DC | PRN
Start: 2012-09-15 — End: 2013-02-02

## 2012-09-15 MED ORDER — CIPROFLOXACIN HCL 500 MG PO TABS: 500.0000 mg | ORAL_TABLET | Freq: Two times a day (BID) | ORAL | Status: DC

## 2012-09-15 NOTE — Telephone Encounter (Signed)
Called the patient and she has scheduled with Dr. Jenny Reichmann 4:15.

## 2012-09-15 NOTE — Assessment & Plan Note (Signed)
stable overall by hx and exam, most recent data reviewed with pt, and pt to continue medical treatment as before Lab Results  Component Value Date   WBC 14.2* 07/23/2012   HGB 14.3 09/08/2012   HCT 42.0 09/08/2012   PLT 274.0 07/23/2012   GLUCOSE 107* 09/08/2012   CHOL 178 08/05/2012   TRIG 99.0 08/05/2012   HDL 55.20 08/05/2012   LDLDIRECT 175.8 02/06/2011   LDLCALC 103* 08/05/2012   ALT 25 07/23/2012   AST 19 07/23/2012   NA 136 09/08/2012   K 4.1 09/08/2012   CL 102 07/23/2012   CREATININE 0.9 07/23/2012   BUN 13 07/23/2012   CO2 28 07/23/2012   TSH 0.92 05/05/2012   INR 1.03 11/07/2011

## 2012-09-15 NOTE — Assessment & Plan Note (Signed)
stable overall by hx and exam, most recent data reviewed with pt, and pt to continue medical treatment as before BP Readings from Last 3 Encounters:  09/15/12 100/68  09/08/12 122/76  09/08/12 122/76

## 2012-09-15 NOTE — Assessment & Plan Note (Signed)
C/w prob cystitis, for cipro course asd, urine studies,  to f/u any worsening symptoms or concerns

## 2012-09-15 NOTE — Progress Notes (Signed)
Subjective:    Patient ID: Leslie Bryant, female    DOB: 08/13/1941, 71 y.o.   MRN: [id number]  HPI  Here with 2-3 days dysuria, freq without urgency, f/c, n/v, flank or abd pain, though has had ongoing LBP, not really worse then usual LBP for the past few yrs.  Pt continues to have recurring LBP without change in severity, bowel or bladder change, fever, wt loss,  worsening LE pain/numbness/weakness, gait change or falls.  Last UTI about 1 yrs, seems to have about 1 infection per yr.  Pt denies chest pain, increased sob or doe, wheezing, orthopnea, PND, increased LE swelling, palpitations, dizziness or syncope.  Pt denies new neurological symptoms such as new headache, or facial or extremity weakness or numbness   Pt denies polydipsia, polyuria. Incidentally s/p left knee arthroscopic surgury last tues.  UA dip with large leukocytes, mod blood.  Denies worsening depressive symptoms, suicidal ideation, or panic. Past Medical History  Diagnosis Date  . Palpitations IRREGULAR HEARTBEAT--  CONTROLLED W/  BETA BLOCKER  . Hypercholesterolemia   . GERD (gastroesophageal reflux disease)   . Diverticulosis of colon     sigmoid  . H/O hiatal hernia   . Arthritis KNEES  . Hypertension   . Acute meniscal tear of knee LEFT KNEE  . Left knee pain   . Swelling of left knee joint    Past Surgical History  Procedure Date  . Cardiac catheterization 12-22-2000  DR BRACKBILL    NORMAL LVF/ NORMAL CORONARY ARTERIES  . Vaginal hysterectomy 1966  . Breast recontruction w/ implants 1987  . Mastectomy 1986    RIGHT BREAST W/ MULTIPLE NODE DISSECTION  . Cholecystectomy 1988  . Knee arthroscopy 2011    RIGHT KNEE  . Knee arthroscopy with medial menisectomy 09/08/2012    Procedure: KNEE ARTHROSCOPY WITH MEDIAL MENISECTOMY;  Surgeon: Tobi Bastos, MD;  Location: [facility];  Service: Orthopedics;  Laterality: Left;  lateral tibial plateau,   . Chondroplasty 09/08/2012    Procedure:  CHONDROPLASTY;  Surgeon: Tobi Bastos, MD;  Location: [facility];  Service: Orthopedics;  Laterality: Left;    reports that she has never smoked. She has never used smokeless tobacco. She reports that she drinks alcohol. She reports that she does not use illicit drugs. family history includes Brain cancer in her sister; Cancer in her brother; Colon cancer in her brother; Heart attack in her father; Heart failure in her sister; Leukemia in her brother; and Lung cancer in her sister. Allergies  Allergen Reactions  . Crestor (Rosuvastatin Calcium) Other (See Comments)    Shoulder pain  . Lipitor (Atorvastatin Calcium) Other (See Comments)    Myalgias   . Pravastatin Other (See Comments)    myalgias  . Penicillins Itching   Current Outpatient Prescriptions on File Prior to Visit  Medication Sig Dispense Refill  . bifidobacterium infantis (ALIGN) capsule Take 1 capsule by mouth daily.      . Calcium Carbonate-Vitamin D (CALCIUM + D PO) Take 1,200 mg by mouth daily.       . celecoxib (CELEBREX) 200 MG capsule Take 200 mg by mouth daily.      Marland Kitchen co-enzyme Q-10 30 MG capsule Take 30 mg by mouth 2 (two) times daily.       Mariane Baumgarten Calcium (STOOL SOFTENER PO) Take 1 capsule by mouth daily.      Marland Kitchen ESTRING 2 MG vaginal ring as directed.      . ezetimibe (  ZETIA) 10 MG tablet Take 1 tablet (10 mg total) by mouth daily.  90 tablet  0  . metoprolol (LOPRESSOR) 50 MG tablet Take 0.5 tablets (25 mg total) by mouth 2 (two) times daily.  90 tablet  3  . Multiple Vitamins-Minerals (CENTRUM SILVER PO) Take 1 tablet by mouth daily.       . Omega-3 Fatty Acids (OMEGA 3 PO) Take 2 tablets by mouth daily.       Marland Kitchen omeprazole (PRILOSEC) 40 MG capsule Take 40 mg by mouth every morning.       Marland Kitchen oxyCODONE-acetaminophen (PERCOCET) 10-650 MG per tablet Take 1 tablet by mouth every 4 (four) hours as needed for pain.  40 tablet  0  . peppermint oil liquid Take 1 capsule by mouth daily.       Review  of Systems  Constitutional: Negative for diaphoresis and unexpected weight change.  HENT: Negative for tinnitus.   Eyes: Negative for photophobia and visual disturbance.  Respiratory: Negative for choking and stridor.   Gastrointestinal: Negative for vomiting and blood in stool.  Genitourinary: Negative for hematuria and decreased urine volume.  Musculoskeletal: Negative for gait problem.  Skin: Negative for color change and wound.  Neurological: Negative for tremors and numbness.  Psychiatric/Behavioral: Negative for decreased concentration. The patient is not hyperactive.       Objective:   Physical Exam BP 100/68  Pulse 111  Temp 98 F (36.7 C) (Oral)  Ht 5' 1.5" (1.562 m)  Wt 149 lb 4 oz (67.699 kg)  BMI 27.74 kg/m2  SpO2 95% Physical Exam  VS noted, mild ill Constitutional: Pt appears well-developed and well-nourished.  HENT: Head: Normocephalic.  Right Ear: External ear normal.  Left Ear: External ear normal.  Eyes: Conjunctivae and EOM are normal. Pupils are equal, round, and reactive to light.  Neck: Normal range of motion. Neck supple.  Cardiovascular: Normal rate and regular rhythm.   Pulmonary/Chest: Effort normal and breath sounds normal.  Abd:  Soft, NT, non-distended, + BS except for mild low mid abd tender, no guarding or rebound Neurological: Pt is alert. Not confused  Skin: Skin is warm. No erythema.  Psychiatric: Pt behavior is normal. Thought content normal. Not depressed affect    Assessment & Plan:

## 2012-09-15 NOTE — Patient Instructions (Addendum)
Take all new medications as prescribed Continue all other medications as before Your urine will be sent for studies today, to make sure the cipro is adequate treatment

## 2012-09-17 ENCOUNTER — Encounter: Payer: Self-pay | Admitting: Internal Medicine

## 2012-09-17 LAB — URINE CULTURE: Colony Count: 80000

## 2012-11-26 ENCOUNTER — Other Ambulatory Visit: Payer: Self-pay | Admitting: Cardiology

## 2012-11-26 DIAGNOSIS — R002 Palpitations: Secondary | ICD-10-CM

## 2012-11-26 DIAGNOSIS — E785 Hyperlipidemia, unspecified: Secondary | ICD-10-CM

## 2012-11-26 DIAGNOSIS — I10 Essential (primary) hypertension: Secondary | ICD-10-CM

## 2012-11-26 MED ORDER — METOPROLOL TARTRATE 50 MG PO TABS: 25.0000 mg | ORAL_TABLET | Freq: Two times a day (BID) | ORAL | Status: DC

## 2012-11-26 MED ORDER — EZETIMIBE 10 MG PO TABS: 10.0000 mg | ORAL_TABLET | Freq: Every day | ORAL | Status: DC

## 2013-01-06 ENCOUNTER — Other Ambulatory Visit: Payer: Self-pay | Admitting: *Deleted

## 2013-01-06 ENCOUNTER — Telehealth: Payer: Self-pay | Admitting: Cardiology

## 2013-01-06 DIAGNOSIS — E785 Hyperlipidemia, unspecified: Secondary | ICD-10-CM

## 2013-01-06 NOTE — Telephone Encounter (Signed)
New Prob   Pt would like to come in for blood work tomorrow 3/6. Orders need to be put in the system. Made appt.

## 2013-01-06 NOTE — Telephone Encounter (Signed)
Done

## 2013-01-07 ENCOUNTER — Other Ambulatory Visit: Payer: Medicare Other

## 2013-01-08 ENCOUNTER — Ambulatory Visit: Payer: Medicare Other | Admitting: Cardiology

## 2013-01-18 ENCOUNTER — Ambulatory Visit (INDEPENDENT_AMBULATORY_CARE_PROVIDER_SITE_OTHER): Payer: Medicare Other | Admitting: Cardiology

## 2013-01-18 ENCOUNTER — Encounter: Payer: Self-pay | Admitting: Cardiology

## 2013-01-18 VITALS — BP 140/91 | HR 88 | Ht 61.5 in | Wt 150.4 lb

## 2013-01-18 DIAGNOSIS — E785 Hyperlipidemia, unspecified: Secondary | ICD-10-CM

## 2013-01-18 DIAGNOSIS — I10 Essential (primary) hypertension: Secondary | ICD-10-CM

## 2013-01-18 DIAGNOSIS — I471 Supraventricular tachycardia, unspecified: Secondary | ICD-10-CM

## 2013-01-18 DIAGNOSIS — E7800 Pure hypercholesterolemia, unspecified: Secondary | ICD-10-CM

## 2013-01-18 DIAGNOSIS — I498 Other specified cardiac arrhythmias: Secondary | ICD-10-CM

## 2013-01-18 LAB — HEPATIC FUNCTION PANEL
ALT: 23 U/L (ref 0–35)
Total Bilirubin: 0.5 mg/dL (ref 0.3–1.2)
Total Protein: 7.3 g/dL (ref 6.0–8.3)

## 2013-01-18 LAB — BASIC METABOLIC PANEL
BUN: 12 mg/dL (ref 6–23)
CO2: 25 mEq/L (ref 19–32)
Chloride: 101 mEq/L (ref 96–112)
Creatinine, Ser: 0.8 mg/dL (ref 0.4–1.2)

## 2013-01-18 LAB — LIPID PANEL
Cholesterol: 157 mg/dL (ref 0–200)
Triglycerides: 88 mg/dL (ref 0.0–149.0)

## 2013-01-18 NOTE — Assessment & Plan Note (Signed)
The patient has a history of hyperlipidemia.  She is intolerant of statins but is able to take ezetimibe.  Blood work today is pending

## 2013-01-18 NOTE — Assessment & Plan Note (Signed)
The patient denies any chest pain or shortness of breath.  She is not having any headaches dizziness or syncope

## 2013-01-18 NOTE — Patient Instructions (Signed)
Will obtain labs today and call you with the results (lp/bmet/hfp)  Your physician recommends that you continue on your current medications as directed. Please refer to the Current Medication list given to you today.  Your physician wants you to follow-up in: 6 months with fasting labs (lp/bmet/hfp)  You will receive a reminder letter in the mail two months in advance. If you don't receive a letter, please call our office to schedule the follow-up appointment.  

## 2013-01-18 NOTE — Progress Notes (Signed)
Quick Note:  Please report to patient. The recent labs are stable. Continue same medication and careful diet. ______ 

## 2013-01-18 NOTE — Assessment & Plan Note (Signed)
Patient has a past history of SVT.  She has had no recurrence since starting on her beta blocker.

## 2013-01-18 NOTE — Progress Notes (Signed)
[name] Date of Birth:  15-Jan-1941 [facility] [address] [place], [place]  [address] [phone]  Fax   [phone]  HPI: This pleasant 72 year old woman is seen for a scheduled office visit. She has a past history of high cholesterol and a history of palpitations. She does not have any history of ischemic heart disease. 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 She has normal left ventricular function. She was in good health until December 2012 when she developed chills fever and abdominal pain and was hospitalized with acute diverticulitis with mini perforation and developed a abscess which had to be drained by Dr. Hassell Done. The patient had not been aware that she had diverticulosis until she had the episode of severe diverticulitis. Since we last saw her she has been doing well.  She has had some osteoarthritic pain in her left knee and has seen her orthopedist who has given her some injections which have helped.   Current Outpatient Prescriptions  Medication Sig Dispense Refill  . bifidobacterium infantis (ALIGN) capsule Take 1 capsule by mouth daily.      . Calcium Carbonate-Vitamin D (CALCIUM + D PO) Take 1,200 mg by mouth daily.       . celecoxib (CELEBREX) 200 MG capsule Take 200 mg by mouth daily.      Marland Kitchen co-enzyme Q-10 30 MG capsule Take 30 mg by mouth 2 (two) times daily.       Mariane Baumgarten Calcium (STOOL SOFTENER PO) Take 1 capsule by mouth daily.      Marland Kitchen ESTRING 2 MG vaginal ring as directed.      . ezetimibe (ZETIA) 10 MG tablet Take 1 tablet (10 mg total) by mouth daily.  90 tablet  3  . metoprolol (LOPRESSOR) 50 MG tablet Take 0.5 tablets (25 mg total) by mouth 2 (two) times daily.  90 tablet  3  . Multiple Vitamins-Minerals (CENTRUM SILVER PO) Take 1 tablet by  mouth daily.       . Omega-3 Fatty Acids (OMEGA 3 PO) Take 2 tablets by mouth daily.       Marland Kitchen omeprazole (PRILOSEC) 40 MG capsule Take 40 mg by mouth every morning.       . phenazopyridine (PYRIDIUM) 100 MG tablet Take 1 tablet (100 mg total) by mouth 3 (three) times daily as needed for pain.  20 tablet  0  . ciprofloxacin (CIPRO) 500 MG tablet Take 1 tablet (500 mg total) by mouth 2 (two) times daily.  20 tablet  0  . oxyCODONE-acetaminophen (PERCOCET) 10-650 MG per tablet Take 1 tablet by mouth every 4 (four) hours as needed for pain.  40 tablet  0  . peppermint oil liquid Take 1 capsule by mouth daily.       No current facility-administered medications for this visit.    Allergies  Allergen Reactions  . Crestor (Rosuvastatin Calcium) Other (See Comments)    Shoulder pain  . Lipitor (Atorvastatin Calcium) Other (See Comments)    Myalgias   . Pravastatin Other (See Comments)    myalgias  . Penicillins Itching    Patient Active Problem List  Diagnosis  . HYPERLIPIDEMIA  . ANXIETY  . DEPRESSION  . EUSTACHIAN TUBE DYSFUNCTION, LEFT  . HYPERTENSION  . ALLERGIC RHINITIS  . GERD  . DIVERTICULOSIS, COLON  . IBS  . OSTEOPENIA  .  BREAST CANCER, HX OF  . COLONIC POLYPS, HX OF  . Palpitations  . Supraventricular tachycardia  . Osteoarthritis, knee  . History of breast cancer  . Malaise and fatigue  . Diverticulitis of large intestine with perforation  . Preventative health care  . Family hx of colon cancer  . Abdominal pain, lower  . Osteoarthritis of left knee  . Complex tear of medial meniscus of left knee as current injury  . Dysuria    History  Smoking status  . Never Smoker   Smokeless tobacco  . Never Used    History  Alcohol Use  . 0.0 oz/week    Comment: occ    Family History  Problem Relation Age of Onset  . Heart attack Father   . Heart failure Sister   . Colon cancer Brother     died/age 30  . Lung cancer Sister   . Brain cancer Sister   .  Cancer Brother     mouth  . Leukemia Brother     Review of Systems: The patient denies any heat or cold intolerance.  No weight gain or weight loss.  The patient denies headaches or blurry vision.  There is no cough or sputum production.  The patient denies dizziness.  There is no hematuria or hematochezia.  The patient denies any muscle aches or arthritis.  The patient denies any rash.  The patient denies frequent falling or instability.  There is no history of depression or anxiety.  All other systems were reviewed and are negative.   Physical Exam: Filed Vitals:   01/18/13 0854  BP: 140/91  Pulse: 88   the general appearance reveals a well-developed well-nourished woman in no distress.  She is having moderate back pain today which accounts for her slightly elevated blood pressure.The head and neck exam reveals pupils equal and reactive.  Extraocular movements are full.  There is no scleral icterus.  The mouth and pharynx are normal.  The neck is supple.  The carotids reveal no bruits.  The jugular venous pressure is normal.  The  thyroid is not enlarged.  There is no lymphadenopathy.  The chest is clear to percussion and auscultation.  There are no rales or rhonchi.  Expansion of the chest is symmetrical.  The precordium is quiet.  The first heart sound is normal.  The second heart sound is physiologically split.  There is no murmur gallop rub or click.  There is no abnormal lift or heave.  The abdomen is soft and nontender.  The bowel sounds are normal.  The liver and spleen are not enlarged.  There are no abdominal masses.  There are no abdominal bruits.  Extremities reveal good pedal pulses.  There is no phlebitis or edema.  There is no cyanosis or clubbing.  Strength is normal and symmetrical in all extremities.  There is no lateralizing weakness.  There are no sensory deficits.  The skin is warm and dry.  There is no rash.      Assessment / Plan: Continue same medication.  Blood work  today pending.  Recheck in 6 months for followup office visit EKG lipid panel hepatic function panel and basal metabolic panel.

## 2013-01-19 ENCOUNTER — Telehealth: Payer: Self-pay | Admitting: *Deleted

## 2013-01-19 NOTE — Telephone Encounter (Signed)
Message copied by Leslie Bryant on Tue Jan 19, 2013  9:15 AM ------      Message from: Darlin Coco      Created: Mon Jan 18, 2013  7:05 PM       Please report to patient.  The recent labs are stable. Continue same medication and careful diet. ------

## 2013-01-19 NOTE — Telephone Encounter (Signed)
Advised patient of lab results  

## 2013-02-01 ENCOUNTER — Other Ambulatory Visit (INDEPENDENT_AMBULATORY_CARE_PROVIDER_SITE_OTHER): Payer: Medicare Other

## 2013-02-01 ENCOUNTER — Ambulatory Visit (INDEPENDENT_AMBULATORY_CARE_PROVIDER_SITE_OTHER): Payer: Medicare Other | Admitting: Internal Medicine

## 2013-02-01 ENCOUNTER — Encounter: Payer: Self-pay | Admitting: Internal Medicine

## 2013-02-01 VITALS — BP 112/80 | HR 91 | Temp 98.0°F | Ht 62.5 in | Wt 153.5 lb

## 2013-02-01 DIAGNOSIS — M545 Low back pain, unspecified: Secondary | ICD-10-CM

## 2013-02-01 DIAGNOSIS — I10 Essential (primary) hypertension: Secondary | ICD-10-CM

## 2013-02-01 DIAGNOSIS — R300 Dysuria: Secondary | ICD-10-CM

## 2013-02-01 LAB — URINALYSIS, ROUTINE W REFLEX MICROSCOPIC
Ketones, ur: NEGATIVE
Specific Gravity, Urine: 1.015 (ref 1.000–1.030)
pH: 5.5 (ref 5.0–8.0)

## 2013-02-01 MED ORDER — LEVOFLOXACIN 250 MG PO TABS: 250.0000 mg | ORAL_TABLET | Freq: Every day | ORAL | Status: DC

## 2013-02-01 NOTE — Assessment & Plan Note (Signed)
Suspect referred pain, exam benign,  to f/u any worsening symptoms or concerns

## 2013-02-01 NOTE — Progress Notes (Signed)
Subjective:    Patient ID: KINJAL SONNER, female    DOB: [birth year], 72 y.o.   MRN: [id number]  HPI Here with GU symptoms similar to last UTI episode nov 2013;  C/o 2-3 days onset dysuria with mild freq, but Denies urinary symptoms such as urgency, flank pain, hematuria or n/v, fever, chills.  Does have some LBP onset at same time but no bowel or bladder change, fever, wt loss,  worsening LE pain/numbness/weakness, gait change or falls.  Not worse with movement or bending.  Pt denies fever, wt loss, night sweats, loss of appetite, or other constitutional symptoms  Past Medical History  Diagnosis Date  . Palpitations IRREGULAR HEARTBEAT--  CONTROLLED W/  BETA BLOCKER  . Hypercholesterolemia   . GERD (gastroesophageal reflux disease)   . Diverticulosis of colon     sigmoid  . H/O hiatal hernia   . Arthritis KNEES  . Hypertension   . Acute meniscal tear of knee LEFT KNEE  . Left knee pain   . Swelling of left knee joint    Past Surgical History  Procedure Laterality Date  . Cardiac catheterization  12-22-2000  DR BRACKBILL    NORMAL LVF/ NORMAL CORONARY ARTERIES  . Vaginal hysterectomy  1966  . Breast recontruction w/ implants  1987  . Mastectomy  1986    RIGHT BREAST W/ MULTIPLE NODE DISSECTION  . Cholecystectomy  1988  . Knee arthroscopy  2011    RIGHT KNEE  . Knee arthroscopy with medial menisectomy  09/08/2012    Procedure: KNEE ARTHROSCOPY WITH MEDIAL MENISECTOMY;  Surgeon: Tobi Bastos, MD;  Location: [facility];  Service: Orthopedics;  Laterality: Left;  lateral tibial plateau,   . Chondroplasty  09/08/2012    Procedure: CHONDROPLASTY;  Surgeon: Tobi Bastos, MD;  Location: [facility];  Service: Orthopedics;  Laterality: Left;    reports that she has never smoked. She has never used smokeless tobacco. She reports that  drinks alcohol. She reports that she does not use illicit drugs. family history includes Brain cancer in her sister;  Cancer in her brother; Colon cancer in her brother; Heart attack in her father; Heart failure in her sister; Leukemia in her brother; and Lung cancer in her sister. Allergies  Allergen Reactions  . Crestor (Rosuvastatin Calcium) Other (See Comments)    Shoulder pain  . Lipitor (Atorvastatin Calcium) Other (See Comments)    Myalgias   . Pravastatin Other (See Comments)    myalgias  . Penicillins Itching   Current Outpatient Prescriptions on File Prior to Visit  Medication Sig Dispense Refill  . bifidobacterium infantis (ALIGN) capsule Take 1 capsule by mouth daily.      . Calcium Carbonate-Vitamin D (CALCIUM + D PO) Take 1,200 mg by mouth daily.       . celecoxib (CELEBREX) 200 MG capsule Take 200 mg by mouth daily.      Marland Kitchen co-enzyme Q-10 30 MG capsule Take 30 mg by mouth 2 (two) times daily.       Mariane Baumgarten Calcium (STOOL SOFTENER PO) Take 1 capsule by mouth daily.      Marland Kitchen ESTRING 2 MG vaginal ring as directed.      . ezetimibe (ZETIA) 10 MG tablet Take 1 tablet (10 mg total) by mouth daily.  90 tablet  3  . metoprolol (LOPRESSOR) 50 MG tablet Take 0.5 tablets (25 mg total) by mouth 2 (two) times daily.  90 tablet  3  . Multiple Vitamins-Minerals (  CENTRUM SILVER PO) Take 1 tablet by mouth daily.       . Omega-3 Fatty Acids (OMEGA 3 PO) Take 2 tablets by mouth daily.       Marland Kitchen omeprazole (PRILOSEC) 40 MG capsule Take 40 mg by mouth every morning.       Marland Kitchen oxyCODONE-acetaminophen (PERCOCET) 10-650 MG per tablet Take 1 tablet by mouth every 4 (four) hours as needed for pain.  40 tablet  0  . peppermint oil liquid Take 1 capsule by mouth daily.      . phenazopyridine (PYRIDIUM) 100 MG tablet Take 1 tablet (100 mg total) by mouth 3 (three) times daily as needed for pain.  20 tablet  0   No current facility-administered medications on file prior to visit.   Review of Systems  Constitutional: Negative for unexpected weight change, or unusual diaphoresis  HENT: Negative for tinnitus.   Eyes:  Negative for photophobia and visual disturbance.  Respiratory: Negative for choking and stridor.   Gastrointestinal: Negative for vomiting and blood in stool.  Genitourinary: Negative for hematuria and decreased urine volume.  Musculoskeletal: Negative for acute joint swelling Skin: Negative for color change and wound.  Neurological: Negative for tremors and numbness other than noted  Psychiatric/Behavioral: Negative for decreased concentration or  hyperactivity.       Objective:   Physical Exam BP 112/80  Pulse 91  Temp(Src) 98 F (36.7 C) (Oral)  Ht 5' 2.5" (1.588 m)  Wt 153 lb 8 oz (69.627 kg)  BMI 27.61 kg/m2  SpO2 94% VS noted,  Constitutional: Pt appears well-developed and well-nourished.  HENT: Head: NCAT.  Right Ear: External ear normal.  Left Ear: External ear normal.  Eyes: Conjunctivae and EOM are normal. Pupils are equal, round, and reactive to light.  Neck: Normal range of motion. Neck supple.  Cardiovascular: Normal rate and regular rhythm.   Pulmonary/Chest: Effort normal and breath sounds normal.  Abd:  Soft, non-distended, + BS with mild low mid abd tender, no guarding, no flank tender Neurological: Pt is alert. Not confused , motor intact, spine nontender Skin: Skin is warm. No erythema.  Psychiatric: Pt behavior is normal. Thought content normal.     Assessment & Plan:

## 2013-02-01 NOTE — Assessment & Plan Note (Signed)
prob recurrent cystitis by exam, Mild to mod, for antibx course,  to f/u any worsening symptoms or concerns, for urine studies

## 2013-02-01 NOTE — Assessment & Plan Note (Signed)
stable overall by history and exam, recent data reviewed with pt, and pt to continue medical treatment as before,  to f/u any worsening symptoms or concerns BP Readings from Last 3 Encounters:  02/01/13 112/80  01/18/13 140/91  09/15/12 100/68

## 2013-02-01 NOTE — Patient Instructions (Addendum)
Please take all new medication as prescribed Please continue all other medications as before, and refills have been done if requested. Your specimen will be sent for culture today Thank you for enrolling in [facility]. Please follow the instructions below to securely access your online medical record. MyChart allows you to send messages to your doctor, view your test results, renew your prescriptions, schedule appointments, and more. To Log into My Chart online, please go by [employer] or [employer] to [email or web address], or download the MyChart App from the [employer].  Your Username is:  [email or web address]  Health and safety inspector) Please send a Set designer on Mychart later today.

## 2013-02-02 ENCOUNTER — Telehealth: Payer: Self-pay | Admitting: Internal Medicine

## 2013-02-02 MED ORDER — PHENAZOPYRIDINE HCL 100 MG PO TABS: 100.0000 mg | ORAL_TABLET | Freq: Three times a day (TID) | ORAL | Status: DC | PRN

## 2013-02-02 MED ORDER — LEVOFLOXACIN 250 MG PO TABS: 250.0000 mg | ORAL_TABLET | Freq: Every day | ORAL | Status: DC

## 2013-02-02 NOTE — Telephone Encounter (Signed)
rx x 2 done erx

## 2013-02-02 NOTE — Telephone Encounter (Signed)
Patient informed and did apologize once again to her.

## 2013-02-02 NOTE — Telephone Encounter (Signed)
Patient calling in today regarding her prescription for Levofloxin Rx yesterday in the office by Dr. Jenny Reichmann. (1)   Medication is NOT  At her pharmacy . [facility] uses [facility] . [phone].  Reviewed EPIC . Advised caller that Rx was sent to Mail order. Apologized and advised I would send message to provider to please change.    (2)  She is asking also for a Rx for pyridium for discomfort with UTI. Apologized to caller and will send request to Dr. Jenny Reichmann now.  PLEASE CONTACT PATIENT WHEN SCRIPT HAS BEEN SENT.  [place], [place] [phone]

## 2013-02-04 LAB — URINE CULTURE: Colony Count: >=100000

## 2013-02-22 ENCOUNTER — Other Ambulatory Visit: Payer: Self-pay | Admitting: Cardiology

## 2013-02-22 DIAGNOSIS — R002 Palpitations: Secondary | ICD-10-CM

## 2013-02-22 DIAGNOSIS — I10 Essential (primary) hypertension: Secondary | ICD-10-CM

## 2013-02-22 NOTE — Telephone Encounter (Signed)
New Prob   Pt calling in requesting a new prescription of MOTOPROLOL 50 mg to [facility].

## 2013-02-22 NOTE — Telephone Encounter (Signed)
Left message to call back  

## 2013-02-23 MED ORDER — METOPROLOL TARTRATE 50 MG PO TABS: 25.0000 mg | ORAL_TABLET | Freq: Two times a day (BID) | ORAL | Status: DC

## 2013-02-23 NOTE — Telephone Encounter (Signed)
F/u ° ° °Pt returning your call °

## 2013-02-23 NOTE — Telephone Encounter (Signed)
Sent Rx as requested.  

## 2013-03-01 ENCOUNTER — Telehealth: Payer: Self-pay | Admitting: Cardiology

## 2013-03-01 NOTE — Telephone Encounter (Signed)
Called in a 2 week supply of Metoprolol for patient, advised

## 2013-03-01 NOTE — Telephone Encounter (Signed)
New problem   Pt was told to call you if she didn't get her prescription for metoprolol 50mg  and you would call [facility] in [facility].

## 2013-05-04 ENCOUNTER — Other Ambulatory Visit (INDEPENDENT_AMBULATORY_CARE_PROVIDER_SITE_OTHER): Payer: Medicare Other

## 2013-05-04 DIAGNOSIS — Z0000 Encounter for general adult medical examination without abnormal findings: Secondary | ICD-10-CM

## 2013-05-04 DIAGNOSIS — D649 Anemia, unspecified: Secondary | ICD-10-CM

## 2013-05-04 LAB — CBC WITH DIFFERENTIAL/PLATELET
Basophils Relative: 0.6 % (ref 0.0–3.0)
Eosinophils Absolute: 0.2 10*3/uL (ref 0.0–0.7)
Eosinophils Relative: 3 % (ref 0.0–5.0)
HCT: 34.7 % — ABNORMAL LOW (ref 36.0–46.0)
Lymphs Abs: 1.8 10*3/uL (ref 0.7–4.0)
MCHC: 33.7 g/dL (ref 30.0–36.0)
MCV: 94.5 fl (ref 78.0–100.0)
Monocytes Absolute: 0.6 10*3/uL (ref 0.1–1.0)
Platelets: 275 10*3/uL (ref 150.0–400.0)
RBC: 3.68 Mil/uL — ABNORMAL LOW (ref 3.87–5.11)
WBC: 6.8 10*3/uL (ref 4.5–10.5)

## 2013-05-04 LAB — URINALYSIS, ROUTINE W REFLEX MICROSCOPIC
Ketones, ur: NEGATIVE
Nitrite: NEGATIVE
Specific Gravity, Urine: 1.02 (ref 1.000–1.030)
Urine Glucose: NEGATIVE

## 2013-05-04 LAB — BASIC METABOLIC PANEL
Calcium: 9.5 mg/dL (ref 8.4–10.5)
Creatinine, Ser: 0.8 mg/dL (ref 0.4–1.2)
GFR: 72.67 mL/min (ref >60.00)
Glucose, Bld: 108 mg/dL — ABNORMAL HIGH (ref 70–99)
Sodium: 134 mEq/L — ABNORMAL LOW (ref 135–145)

## 2013-05-04 LAB — HEPATIC FUNCTION PANEL
ALT: 25 U/L (ref 0–35)
Total Protein: 7.3 g/dL (ref 6.0–8.3)

## 2013-05-04 LAB — LIPID PANEL
HDL: 55.9 mg/dL (ref >39.00)
Total CHOL/HDL Ratio: 3
Triglycerides: 89 mg/dL (ref 0.0–149.0)

## 2013-05-05 ENCOUNTER — Ambulatory Visit: Payer: Medicare Other

## 2013-05-05 DIAGNOSIS — D649 Anemia, unspecified: Secondary | ICD-10-CM

## 2013-05-05 DIAGNOSIS — D518 Other vitamin B12 deficiency anemias: Secondary | ICD-10-CM

## 2013-05-05 LAB — VITAMIN B12: Vitamin B-12: 492 pg/mL (ref 211–911)

## 2013-05-05 LAB — IBC PANEL
Iron: 73 ug/dL (ref 42–145)
Saturation Ratios: 22.2 % (ref 20.0–50.0)

## 2013-05-06 ENCOUNTER — Other Ambulatory Visit (INDEPENDENT_AMBULATORY_CARE_PROVIDER_SITE_OTHER): Payer: Medicare Other

## 2013-05-06 ENCOUNTER — Ambulatory Visit (INDEPENDENT_AMBULATORY_CARE_PROVIDER_SITE_OTHER): Payer: Medicare Other | Admitting: Internal Medicine

## 2013-05-06 ENCOUNTER — Encounter: Payer: Self-pay | Admitting: Internal Medicine

## 2013-05-06 ENCOUNTER — Other Ambulatory Visit: Payer: Self-pay | Admitting: Internal Medicine

## 2013-05-06 VITALS — BP 110/78 | HR 84 | Temp 98.1°F | Wt 155.2 lb

## 2013-05-06 DIAGNOSIS — D649 Anemia, unspecified: Secondary | ICD-10-CM

## 2013-05-06 DIAGNOSIS — Z0000 Encounter for general adult medical examination without abnormal findings: Secondary | ICD-10-CM

## 2013-05-06 LAB — CBC WITH DIFFERENTIAL/PLATELET
Basophils Absolute: 0 10*3/uL (ref 0.0–0.1)
Eosinophils Absolute: 0.1 10*3/uL (ref 0.0–0.7)
MCHC: 34.1 g/dL (ref 30.0–36.0)
MCV: 93.7 fl (ref 78.0–100.0)
Monocytes Absolute: 1 10*3/uL (ref 0.1–1.0)
Neutrophils Relative %: 47.4 % (ref 43.0–77.0)
Platelets: 306 10*3/uL (ref 150.0–400.0)
WBC: 9 10*3/uL (ref 4.5–10.5)

## 2013-05-06 NOTE — Assessment & Plan Note (Addendum)
Recent 11.7 - ? Clinical signficance - iron/b12 normal, normocytic, for f/u lab today, consider heme if confirmed, would be new problem though mild but seems significant compared to prior more stable HGb approx 14

## 2013-05-06 NOTE — Patient Instructions (Signed)
Please continue all other medications as before, and refills have been done if requested. Please have the pharmacy call with any other refills you may need., Please continue your efforts at being more active, low cholesterol diet, and weight control. You are otherwise up to date with prevention measures today. Please go to the LAB in the Basement (turn left off the elevator) for the tests to be done today - just the "CBC" today You will be contacted by phone if any changes need to be made immediately.  Otherwise, you will receive a letter about your results with an explanation, but please check with MyChart first.  Please remember to sign up for My Chart if you have not done so, as this will be important to you in the future with finding out test results, communicating by private email, and scheduling acute appointments online when needed.  Please return in 1 year for your yearly visit, or sooner if needed, with Lab testing done 3-5 days before

## 2013-05-06 NOTE — Progress Notes (Signed)
Subjective:    Patient ID: Leslie Bryant, female    DOB: 09-14-1941, 71 y.o.   MRN: [id number]  HPI  Here for wellness and f/u;  Overall doing ok;  Pt denies CP, worsening SOB, DOE, wheezing, orthopnea, PND, worsening LE edema, palpitations, dizziness or syncope.  Pt denies neurological change such as new headache, facial or extremity weakness.  Pt denies polydipsia, polyuria, or low sugar symptoms. Pt states overall good compliance with treatment and medications, good tolerability, and has been trying to follow lower cholesterol diet.  Pt denies worsening depressive symptoms, suicidal ideation or panic. No fever, night sweats, wt loss, loss of appetite, or other constitutional symptoms.  Pt states good ability with ADL's, has low fall risk, home safety reviewed and adequate, no other significant changes in hearing or vision, and only occasionally active with exercise.  Has gained several lbs.  No overt bleeding or bruising.  Has recurring pain to medial leg just below the knee with some fullness occasional that comes and goes.  Has end stage right knee DJD, sees Dr Christophe Louis, celebrex working ok for now. Past Medical History  Diagnosis Date  . Palpitations IRREGULAR HEARTBEAT--  CONTROLLED W/  BETA BLOCKER  . Hypercholesterolemia   . GERD (gastroesophageal reflux disease)   . Diverticulosis of colon     sigmoid  . H/O hiatal hernia   . Arthritis KNEES  . Hypertension   . Acute meniscal tear of knee LEFT KNEE  . Left knee pain   . Swelling of left knee joint    Past Surgical History  Procedure Laterality Date  . Cardiac catheterization  12-22-2000  DR BRACKBILL    NORMAL LVF/ NORMAL CORONARY ARTERIES  . Vaginal hysterectomy  1966  . Breast recontruction w/ implants  1987  . Mastectomy  1986    RIGHT BREAST W/ MULTIPLE NODE DISSECTION  . Cholecystectomy  1988  . Knee arthroscopy  2011    RIGHT KNEE  . Knee arthroscopy with medial menisectomy  09/08/2012    Procedure: KNEE ARTHROSCOPY  WITH MEDIAL MENISECTOMY;  Surgeon: Tobi Bastos, MD;  Location: [facility];  Service: Orthopedics;  Laterality: Left;  lateral tibial plateau,   . Chondroplasty  09/08/2012    Procedure: CHONDROPLASTY;  Surgeon: Tobi Bastos, MD;  Location: [facility];  Service: Orthopedics;  Laterality: Left;    reports that she has never smoked. She has never used smokeless tobacco. She reports that  drinks alcohol. She reports that she does not use illicit drugs. family history includes Brain cancer in her sister; Cancer in her brother; Colon cancer in her brother; Heart attack in her father; Heart failure in her sister; Leukemia in her brother; and Lung cancer in her sister. Allergies  Allergen Reactions  . Crestor (Rosuvastatin Calcium) Other (See Comments)    Shoulder pain  . Lipitor (Atorvastatin Calcium) Other (See Comments)    Myalgias   . Pravastatin Other (See Comments)    myalgias  . Penicillins Itching   Current Outpatient Prescriptions on File Prior to Visit  Medication Sig Dispense Refill  . bifidobacterium infantis (ALIGN) capsule Take 1 capsule by mouth daily.      . Calcium Carbonate-Vitamin D (CALCIUM + D PO) Take 1,200 mg by mouth daily.       . celecoxib (CELEBREX) 200 MG capsule Take 200 mg by mouth daily.      Marland Kitchen co-enzyme Q-10 30 MG capsule Take 30 mg by mouth 2 (two) times daily.       Marland Kitchen  ESTRING 2 MG vaginal ring as directed.      . ezetimibe (ZETIA) 10 MG tablet Take 1 tablet (10 mg total) by mouth daily.  90 tablet  3  . metoprolol (LOPRESSOR) 50 MG tablet Take 0.5 tablets (25 mg total) by mouth 2 (two) times daily.  90 tablet  3  . Multiple Vitamins-Minerals (CENTRUM SILVER PO) Take 1 tablet by mouth daily.       . Omega-3 Fatty Acids (OMEGA 3 PO) Take 2 tablets by mouth daily.       Marland Kitchen omeprazole (PRILOSEC) 40 MG capsule Take 40 mg by mouth every morning.       . peppermint oil liquid Take 1 capsule by mouth daily.       No current  facility-administered medications on file prior to visit.   Review of Systems Constitutional: Negative for diaphoresis, activity change, appetite change or unexpected weight change.  HENT: Negative for hearing loss, ear pain, facial swelling, mouth sores and neck stiffness.   Eyes: Negative for pain, redness and visual disturbance.  Respiratory: Negative for shortness of breath and wheezing.   Cardiovascular: Negative for chest pain and palpitations.  Gastrointestinal: Negative for diarrhea, blood in stool, abdominal distention or other pain Genitourinary: Negative for hematuria, flank pain or change in urine volume.  Musculoskeletal: Negative for myalgias and joint swelling.  Skin: Negative for color change and wound.  Neurological: Negative for syncope and numbness. other than noted Hematological: Negative for adenopathy.  Psychiatric/Behavioral: Negative for hallucinations, self-injury, decreased concentration and agitation.      Objective:   Physical Exam BP 110/78  Pulse 84  Temp(Src) 98.1 F (36.7 C) (Oral)  Wt 155 lb 4 oz (70.421 kg)  BMI 27.93 kg/m2  SpO2 96% VS noted,  Constitutional: Pt is oriented to person, place, and time. Appears well-developed and well-nourished.  Head: Normocephalic and atraumatic.  Right Ear: External ear normal.  Left Ear: External ear normal.  Nose: Nose normal.  Mouth/Throat: Oropharynx is clear and moist.  Eyes: Conjunctivae and EOM are normal. Pupils are equal, round, and reactive to light.  Neck: Normal range of motion. Neck supple. No JVD present. No tracheal deviation present.  Cardiovascular: Normal rate, regular rhythm, normal heart sounds and intact distal pulses.   Pulmonary/Chest: Effort normal and breath sounds normal.  Abdominal: Soft. Bowel sounds are normal. There is no tenderness. No HSM  Musculoskeletal: Normal range of motion. Exhibits no edema.  Lymphadenopathy:  Has no cervical adenopathy.  Neurological: Pt is alert and  oriented to person, place, and time. Pt has normal reflexes. No cranial nerve deficit.  ,mild tender over the left pes anserine bursa, no mass or skin change Right knee with crepitus, traace effusion Skin: Skin is warm and dry. No rash noted.  Psychiatric:  Has  normal mood and affect. Behavior is normal.     Assessment & Plan:

## 2013-05-07 NOTE — Assessment & Plan Note (Signed)

## 2013-05-12 ENCOUNTER — Telehealth: Payer: Self-pay

## 2013-05-12 NOTE — Telephone Encounter (Signed)
Patient ok then with referral

## 2013-05-12 NOTE — Telephone Encounter (Signed)
I am not sure how to proceed, so that was the reason for the referral.  I would still recommend referral

## 2013-05-12 NOTE — Telephone Encounter (Signed)
The patient does not want the referral to a hematologist and would prefer PCP to treat her problem.  Please advise

## 2013-05-25 ENCOUNTER — Telehealth: Payer: Self-pay | Admitting: Hematology and Oncology

## 2013-05-25 NOTE — Telephone Encounter (Signed)
S/W PT IN RE NP APPT 08/06 @ 10:30 W/DR. [place] PACKET MAILED

## 2013-05-26 ENCOUNTER — Encounter: Payer: Self-pay | Admitting: Internal Medicine

## 2013-06-09 ENCOUNTER — Encounter: Payer: Self-pay | Admitting: Hematology and Oncology

## 2013-06-09 ENCOUNTER — Telehealth: Payer: Self-pay | Admitting: Hematology and Oncology

## 2013-06-09 ENCOUNTER — Ambulatory Visit: Payer: Medicare Other | Admitting: Lab

## 2013-06-09 ENCOUNTER — Ambulatory Visit: Payer: Medicare Other

## 2013-06-09 ENCOUNTER — Other Ambulatory Visit (HOSPITAL_BASED_OUTPATIENT_CLINIC_OR_DEPARTMENT_OTHER): Payer: Medicare Other | Admitting: Lab

## 2013-06-09 ENCOUNTER — Ambulatory Visit (HOSPITAL_BASED_OUTPATIENT_CLINIC_OR_DEPARTMENT_OTHER): Payer: Medicare Other | Admitting: Hematology and Oncology

## 2013-06-09 VITALS — BP 137/80 | HR 99 | Temp 98.0°F | Resp 18 | Ht 62.0 in | Wt 155.0 lb

## 2013-06-09 DIAGNOSIS — D649 Anemia, unspecified: Secondary | ICD-10-CM

## 2013-06-09 DIAGNOSIS — D539 Nutritional anemia, unspecified: Secondary | ICD-10-CM | POA: Insufficient documentation

## 2013-06-09 LAB — CBC WITH DIFFERENTIAL/PLATELET
BASO%: 0.4 % (ref 0.0–2.0)
Basophils Absolute: 0 10*3/uL (ref 0.0–0.1)
Eosinophils Absolute: 0.2 10*3/uL (ref 0.0–0.5)
HCT: 31.8 % — ABNORMAL LOW (ref 34.8–46.6)
HGB: 10.9 g/dL — ABNORMAL LOW (ref 11.6–15.9)
LYMPH%: 26.6 % (ref 14.0–49.7)
MCHC: 34.2 g/dL (ref 31.5–36.0)
MONO#: 0.6 10*3/uL (ref 0.1–0.9)
NEUT#: 4.3 10*3/uL (ref 1.5–6.5)
NEUT%: 61.2 % (ref 38.4–76.8)
Platelets: 263 10*3/uL (ref 145–400)
WBC: 7 10*3/uL (ref 3.9–10.3)
lymph#: 1.9 10*3/uL (ref 0.9–3.3)

## 2013-06-09 LAB — COMPREHENSIVE METABOLIC PANEL (CC13)
ALT: 21 U/L (ref 0–55)
CO2: 25 mEq/L (ref 22–29)
Calcium: 9.3 mg/dL (ref 8.4–10.4)
Chloride: 106 mEq/L (ref 98–109)
Creatinine: 0.8 mg/dL (ref 0.6–1.1)
Glucose: 89 mg/dl (ref 70–140)
Total Bilirubin: 0.25 mg/dL (ref 0.20–1.20)
Total Protein: 7.1 g/dL (ref 6.4–8.3)

## 2013-06-09 LAB — FOLATE: Folate: >20 ng/mL

## 2013-06-09 LAB — VITAMIN B12: Vitamin B-12: 486 pg/mL (ref 211–911)

## 2013-06-09 LAB — IRON AND TIBC CHCC: Iron: 77 ug/dL (ref 41–142)

## 2013-06-09 NOTE — Telephone Encounter (Signed)
gv and pritned appt sched and avs for pt...sent pt back to the lab

## 2013-06-09 NOTE — Progress Notes (Signed)
[facility]  [phone] Fax:([phone]) [place]    Referral MD  Reason for Referral: Anemia  HPI:  We have the pleasure of seeing Ms. Leslie Bryant in our hematology clinic today in consultation regarding her anemia. Ms. Leslie Bryant is a pleasant 72 years old old Caucasian female with remote history of "breast cancer"` ( we do not have records regarding this, it  is per patient report report) underwent mastectomy back in 1985 and she has no evidence of disease relapse since then. Patient has no complaint except for left knee pain, for which she is taking Celebrex, otherwise she denied any other complaints, denied nausea vomiting, denied change in bowel habits, benign rectal bleeding. Patient stated that she is active, has good appetite, and weight has been stable and she eats a balanced diet. She most recent CBC was done on 05/06/2013 and her hemoglobin at that time was 11.7, MCV 93.7, RDW 12.4.     Past Medical History  Diagnosis Date  . Palpitations IRREGULAR HEARTBEAT--  CONTROLLED W/  BETA BLOCKER  . Hypercholesterolemia   . GERD (gastroesophageal reflux disease)   . Diverticulosis of colon     sigmoid  . H/O hiatal hernia   . Arthritis KNEES  . Hypertension   . Acute meniscal tear of knee LEFT KNEE  . Left knee pain   . Swelling of left knee joint   :  Past Surgical History  Procedure Laterality Date  . Cardiac catheterization  12-22-2000  DR BRACKBILL    NORMAL LVF/ NORMAL CORONARY ARTERIES  . Vaginal hysterectomy  1966  . Breast recontruction w/ implants  1987  . Mastectomy  1986    RIGHT BREAST W/ MULTIPLE NODE DISSECTION  . Cholecystectomy  1988  . Knee arthroscopy  2011    RIGHT KNEE  . Knee arthroscopy with medial menisectomy  09/08/2012    Procedure: KNEE ARTHROSCOPY WITH MEDIAL MENISECTOMY;  Surgeon: Tobi Bastos, MD;  Location: [facility];  Service: Orthopedics;  Laterality: Left;   lateral tibial plateau,   . Chondroplasty  09/08/2012    Procedure: CHONDROPLASTY;  Surgeon: Tobi Bastos, MD;  Location: [facility];  Service: Orthopedics;  Laterality: Left;  :  Current Outpatient Prescriptions  Medication Sig Dispense Refill  . bifidobacterium infantis (ALIGN) capsule Take 1 capsule by mouth daily.      . Calcium Carbonate-Vitamin D (CALCIUM + D PO) Take 1,200 mg by mouth daily.       . celecoxib (CELEBREX) 200 MG capsule Take 200 mg by mouth daily.      Marland Kitchen co-enzyme Q-10 30 MG capsule Take 30 mg by mouth 2 (two) times daily.       Marland Kitchen ezetimibe (ZETIA) 10 MG tablet Take 1 tablet (10 mg total) by mouth daily.  90 tablet  3  . metoprolol (LOPRESSOR) 50 MG tablet Take 0.5 tablets (25 mg total) by mouth 2 (two) times daily.  90 tablet  3  . Multiple Vitamins-Minerals (CENTRUM SILVER PO) Take 1 tablet by mouth daily.       . Omega-3 Fatty Acids (OMEGA 3 PO) Take 2 tablets by mouth daily.       Marland Kitchen omeprazole (PRILOSEC) 40 MG capsule Take 40 mg by mouth every morning.       . traMADol (ULTRAM) 50 MG tablet Take 50 mg by mouth every 8 (eight) hours as needed.       No current facility-administered  medications for this visit.     Allergies  Allergen Reactions  . Crestor (Rosuvastatin Calcium) Other (See Comments)    Shoulder pain  . Lipitor (Atorvastatin Calcium) Other (See Comments)    Myalgias   . Pravastatin Other (See Comments)    myalgias  . Penicillins Itching  :  Family History  Problem Relation Age of Onset  . Heart attack Father   . Heart failure Sister   . Colon cancer Brother     died/age 29  . Lung cancer Sister   . Brain cancer Sister   . Cancer Brother     mouth  . Leukemia Brother   :  History   Social History  . Marital Status: Married    Spouse Name: N/A    Number of Children: 2  . Years of Education: N/A   Occupational History  . Retired    Social History Main Topics  . Smoking status: Never Smoker   . Smokeless  tobacco: Never Used  . Alcohol Use: 0.0 oz/week     Comment: occ  . Drug Use: No  . Sexually Active: Not on file   Other Topics Concern  . Not on file   Social History Narrative  . No narrative on file  :  Pertinent items are noted in HPI.  Exam: Blood pressure 137/80, pulse 99, temperature 98 F (36.7 C), temperature source Oral, resp. rate 18, height 5\' 2"  (1.575 m), weight 155 lb (70.308 kg).   ECOG PERFORMANCE STATUS: 0 - Asymptomatic  GENERAL: No distress, well nourished.  SKIN:  No rashes or significant lesions  HEAD: Normocephalic, No masses, lesions, tenderness or abnormalities  EYES: Conjunctiva are pink and non-injected  ENT: External ears normal ,lips , buccal mucosa, and tongue normal and mucous membranes are moist  LYMPH: No palpable lymphadenopathy  BREAST:Normal without mass, skin or nipple changes or axillary nodes,  LUNGS: Clear to auscultation, no crackles or wheezes HEART: Regular rate & rhythm, no murmurs, no gallops, S1 normal and S2 normal  ABDOMEN: Abdomen soft, non-tender, normal bowel sounds, no masses or organomegaly and no hepatosplenomegaly  MSK: No CVA tenderness and no tenderness on percussion of the back or rib cage. EXTREMITIES: No edema, no skin discoloration or tenderness NEURO: Alert & oriented, no focal motor/sensory deficits.  Lab Results  Component Value Date   WBC 9.0 05/06/2013   HGB 11.7* 05/06/2013   HCT 34.4* 05/06/2013   PLT 306.0 05/06/2013   GLUCOSE 108* 05/04/2013   CHOL 177 05/04/2013   TRIG 89.0 05/04/2013   HDL 55.90 05/04/2013   LDLDIRECT 175.8 02/06/2011   LDLCALC 103* 05/04/2013   ALT 25 05/04/2013   AST 22 05/04/2013   NA 134* 05/04/2013   K 5.4* 05/04/2013   CL 102 05/04/2013   CREATININE 0.8 05/04/2013   BUN 21 05/04/2013   CO2 28 05/04/2013     Assessment and Plan: Ms. Leslie Bryant is a pleasant 72 years old Caucasian female with normocytic anemia, she is asymptomatic, the rest of her counts is normal.Today in clinic with [name] we had  a along discussion about her disease, diagnosis. We we shared the CBC result with patient, explained that her Hb  is mildly decreased. We also went over the possible differential diagnosis of normocytic anemia. We informed the patient that I wold like to repeat her CBC, BMP, reticulocyte count, TSH and ferritin. This could be an anemia of chromic diease secondary to OA but we will await the result  of the lab work.  Of note, patient denied any rectal or vaginal bleeding and she stated that she is up-to-date in terms of her screening procedures.  patient will return to our clinic at 1 months followup and discuss the result of your blood work.   Thank you very much for the consult.    Orie Fisherman, MD 06/09/2013 11:48 AM

## 2013-06-09 NOTE — Progress Notes (Signed)
Checked in new pt with no financial concerns. °

## 2013-06-23 ENCOUNTER — Telehealth: Payer: Self-pay | Admitting: Hematology and Oncology

## 2013-06-23 NOTE — Telephone Encounter (Signed)
, °

## 2013-07-01 ENCOUNTER — Ambulatory Visit: Payer: Medicare Other

## 2013-07-08 ENCOUNTER — Ambulatory Visit (HOSPITAL_BASED_OUTPATIENT_CLINIC_OR_DEPARTMENT_OTHER): Payer: Medicare Other | Admitting: Hematology and Oncology

## 2013-07-08 ENCOUNTER — Telehealth: Payer: Self-pay | Admitting: Hematology and Oncology

## 2013-07-08 ENCOUNTER — Ambulatory Visit (HOSPITAL_BASED_OUTPATIENT_CLINIC_OR_DEPARTMENT_OTHER): Payer: Medicare Other | Admitting: Lab

## 2013-07-08 VITALS — BP 147/65 | HR 88 | Temp 98.0°F | Resp 18 | Wt 152.6 lb

## 2013-07-08 DIAGNOSIS — D649 Anemia, unspecified: Secondary | ICD-10-CM

## 2013-07-08 DIAGNOSIS — D539 Nutritional anemia, unspecified: Secondary | ICD-10-CM

## 2013-07-08 LAB — CBC WITH DIFFERENTIAL/PLATELET
Basophils Absolute: 0 10*3/uL (ref 0.0–0.1)
Eosinophils Absolute: 0.1 10*3/uL (ref 0.0–0.5)
HCT: 35 % (ref 34.8–46.6)
HGB: 12 g/dL (ref 11.6–15.9)
MONO#: 0.6 10*3/uL (ref 0.1–0.9)
NEUT%: 56 % (ref 38.4–76.8)
WBC: 6.2 10*3/uL (ref 3.9–10.3)
lymph#: 1.9 10*3/uL (ref 0.9–3.3)

## 2013-07-08 LAB — MORPHOLOGY: PLT EST: ADEQUATE

## 2013-07-08 NOTE — Telephone Encounter (Signed)
gv andprinted appt sched and avs for pt for SEpt...sent pt back to the lab

## 2013-07-11 NOTE — Progress Notes (Signed)
ID: Leslie Bryant OB: May 05, 1941  MR#: [id number]  [place]  [phone]) [phone] [phone]) [phone]   OFFICE PROGRESS NOTE  PCP: [name], MD GYN:   SU:  OTHER MD: DIAGNOSIS:  Anemia  HISTORY OF PRESENT ILLNESS: Leslie Bryant was seen in our hematology clinic on 06/08/13 by Dr. Jilda Roche in consultation regarding her anemia. Leslie Bryant is a pleasant 72 years old Caucasian female with remote history of "breast cancer"` ( we do not have records regarding this, it is per Bryant report) underwent mastectomy back in 1985 and she has no evidence of disease since then. Bryant has no complaint except for left knee pain, for which she is taking Celebrex, otherwise she denied any other complaints, denied nausea vomiting, denied change in bowel habits, benign rectal bleeding. Bryant stated that she is active, has good appetite, and weight has been stable and she eats a balanced diet. She most recent CBC was done on 05/06/2013 and her hemoglobin at that time was 11.7, MCV 93.7, RDW 12.4. Work up in form of  CBC, BMP, reticulocyte count, TSH and ferritin was ordered   INTERVAL HISTORY:  Bryant presented today for discussion of work up for anemia. Her appetite is good and weight is stable.She has chronic nasal discharge  Secondary to allergy. Bryant reported that occasionally she has palpitations. She complains on left knee pain which she plan to repair on July 21, 2013. Leslie Bryant denied fever, chills, night sweats. She denied headaches, double vision, blurry vision, nasal congestion, nasal discharge, hearing problems, odynophagia or dysphagia. No chest pain, palpitations, dyspnea, cough, abdominal pain, nausea, vomiting, diarrhea, constipation, hematochezia. Leslie Bryant denied dysuria, nocturia, polyuria, hematuria, myalgia, numbness, tingling, psychiatric problems.  Review of Systems  Constitutional: Negative for fever, chills, weight loss, malaise/fatigue and  diaphoresis.  HENT: Negative for hearing loss, ear pain, nosebleeds, congestion, sore throat, neck pain and tinnitus.   Eyes: Negative for blurred vision, double vision and photophobia.  Respiratory: Negative for cough, hemoptysis, sputum production, shortness of breath, wheezing and stridor.   Cardiovascular: Negative for chest pain, palpitations, orthopnea, claudication, leg swelling and PND.  Gastrointestinal: Negative for heartburn, nausea, vomiting, abdominal pain, diarrhea, constipation, blood in stool and melena.  Genitourinary: Negative for dysuria, urgency, frequency, hematuria and flank pain.  Musculoskeletal: Positive for joint pain. Negative for myalgias and back pain.  Skin: Negative for itching and rash.  Neurological: Negative for dizziness, tingling, tremors, sensory change, speech change, focal weakness, seizures, loss of consciousness and headaches.  Endo/Heme/Allergies: Negative for polydipsia. Does not bruise/bleed easily.  Psychiatric/Behavioral: Negative.      PAST MEDICAL HISTORY: Past Medical History  Diagnosis Date  . Palpitations IRREGULAR HEARTBEAT--  CONTROLLED W/  BETA BLOCKER  . Hypercholesterolemia   . GERD (gastroesophageal reflux disease)   . Diverticulosis of colon     sigmoid  . H/O hiatal hernia   . Arthritis KNEES  . Hypertension   . Acute meniscal tear of knee LEFT KNEE  . Left knee pain   . Swelling of left knee joint     PAST SURGICAL HISTORY: Past Surgical History  Procedure Laterality Date  . Cardiac catheterization  12-22-2000  DR BRACKBILL    NORMAL LVF/ NORMAL CORONARY ARTERIES  . Vaginal hysterectomy  1966  . Breast recontruction w/ implants  1987  . Mastectomy  1986    RIGHT BREAST W/ MULTIPLE NODE DISSECTION  . Cholecystectomy  1988  . Knee arthroscopy  2011    RIGHT KNEE  .  Knee arthroscopy with medial menisectomy  09/08/2012    Procedure: KNEE ARTHROSCOPY WITH MEDIAL MENISECTOMY;  Surgeon: Tobi Bastos, MD;  Location:  [facility];  Service: Orthopedics;  Laterality: Left;  lateral tibial plateau,   . Chondroplasty  09/08/2012    Procedure: CHONDROPLASTY;  Surgeon: Tobi Bastos, MD;  Location: [facility];  Service: Orthopedics;  Laterality: Left;    FAMILY HISTORY Family History  Problem Relation Age of Onset  . Heart attack Father   . Heart failure Sister   . Colon cancer Brother     died/age 81  . Lung cancer Sister   . Brain cancer Sister   . Cancer Brother     mouth  . Leukemia Brother    HEALTH MAINTENANCE: History  Substance Use Topics  . Smoking status: Never Smoker   . Smokeless tobacco: Never Used  . Alcohol Use: 0.0 oz/week     Comment: occ    Allergies  Allergen Reactions  . Crestor [Rosuvastatin Calcium] Other (See Comments)    Shoulder pain  . Lipitor [Atorvastatin Calcium] Other (See Comments)    Myalgias   . Pravastatin Other (See Comments)    myalgias  . Penicillins Itching    Current Outpatient Prescriptions  Medication Sig Dispense Refill  . bifidobacterium infantis (ALIGN) capsule Take 1 capsule by mouth daily.      . Calcium Carbonate-Vitamin D (CALCIUM + D PO) Take 1,200 mg by mouth daily.       . celecoxib (CELEBREX) 200 MG capsule Take 200 mg by mouth daily.      Marland Kitchen co-enzyme Q-10 30 MG capsule Take 30 mg by mouth 2 (two) times daily.       Marland Kitchen ezetimibe (ZETIA) 10 MG tablet Take 1 tablet (10 mg total) by mouth daily.  90 tablet  3  . HYDROcodone-acetaminophen (NORCO/VICODIN) 5-325 MG per tablet Take 1 tablet by mouth every 6 (six) hours as needed for pain.      . metoprolol (LOPRESSOR) 50 MG tablet Take 0.5 tablets (25 mg total) by mouth 2 (two) times daily.  90 tablet  3  . Multiple Vitamins-Minerals (CENTRUM SILVER PO) Take 1 tablet by mouth daily.       . Omega-3 Fatty Acids (OMEGA 3 PO) Take 2 tablets by mouth daily.       Marland Kitchen omeprazole (PRILOSEC) 40 MG capsule Take 40 mg by mouth every morning.        No current  facility-administered medications for this visit.    OBJECTIVE: Filed Vitals:   07/08/13 0843  BP: 147/65  Pulse: 88  Temp: 98 F (36.7 C)  Resp: 18     Body mass index is 27.9 kg/(m^2).    ECOG FS: 0  PHYSICAL EXAMINATION:  HEENT: Sclerae anicteric.  Conjunctivae were pink. Pupils round and reactive bilaterally. Oral mucosa is moist without ulceration or thrush. No occipital, submandibular, cervical, supraclavicular or axillar adenopathy. Lungs: clear to auscultation without wheezes. No rales or rhonchi. Heart: regular rate and rhythm. No murmur, gallop or rubs. Abdomen: soft, non tender. No guarding or rebound tenderness. Bowel sounds are present. No palpable hepatosplenomegaly. MSK: no focal spinal tenderness. Extremities: No clubbing or cyanosis.No calf tenderness to palpitation, no peripheral edema. Leslie Bryant had grossly intact strength in upper and lower extremities. Skin exam was without ecchymosis, petechiae. Neuro: non-focal, alert and oriented to time, person and place, appropriate affect  LAB RESULTS:  CMP     Component Value Date/Time  NA 140 06/09/2013 1209   NA 134* 05/04/2013 0917   K 4.3 06/09/2013 1209   K 5.4* 05/04/2013 0917   CL 102 05/04/2013 0917   CO2 25 06/09/2013 1209   CO2 28 05/04/2013 0917   GLUCOSE 89 06/09/2013 1209   GLUCOSE 108* 05/04/2013 0917   BUN 18.6 06/09/2013 1209   BUN 21 05/04/2013 0917   CREATININE 0.8 06/09/2013 1209   CREATININE 0.8 05/04/2013 0917   CALCIUM 9.3 06/09/2013 1209   CALCIUM 9.5 05/04/2013 0917   PROT 7.1 06/09/2013 1209   PROT 7.3 05/04/2013 0917   ALBUMIN 3.5 06/09/2013 1209   ALBUMIN 3.9 05/04/2013 0917   AST 19 06/09/2013 1209   AST 22 05/04/2013 0917   ALT 21 06/09/2013 1209   ALT 25 05/04/2013 0917   ALKPHOS 67 06/09/2013 1209   ALKPHOS 63 05/04/2013 0917   BILITOT 0.25 06/09/2013 1209   BILITOT 0.6 05/04/2013 0917   GFRNONAA 87* 11/04/2011 0655   GFRAA >90 11/04/2011 0655    Lab Results  Component Value Date   WBC 6.2 07/08/2013   NEUTROABS 3.5  07/08/2013   HGB 12.0 07/08/2013   HCT 35.0 07/08/2013   MCV 92.5 07/08/2013   PLT 330 07/08/2013      Chemistry      Component Value Date/Time   NA 140 06/09/2013 1209   NA 134* 05/04/2013 0917   K 4.3 06/09/2013 1209   K 5.4* 05/04/2013 0917   CL 102 05/04/2013 0917   CO2 25 06/09/2013 1209   CO2 28 05/04/2013 0917   BUN 18.6 06/09/2013 1209   BUN 21 05/04/2013 0917   CREATININE 0.8 06/09/2013 1209   CREATININE 0.8 05/04/2013 0917      Component Value Date/Time   CALCIUM 9.3 06/09/2013 1209   CALCIUM 9.5 05/04/2013 0917   ALKPHOS 67 06/09/2013 1209   ALKPHOS 63 05/04/2013 0917   AST 19 06/09/2013 1209   AST 22 05/04/2013 0917   ALT 21 06/09/2013 1209   ALT 25 05/04/2013 0917   BILITOT 0.25 06/09/2013 1209   BILITOT 0.6 05/04/2013 0917       STUDIES: No results found.  ASSESSMENT AND PLAN: 1. Normocytic anemia. Previous work up was negative for vitamin B!2, folate, iron deficiency, anemia of chronic disease. I will order first round of test: haptoglobin, DAT, LDH, SPEP&IFE, UIFE/TP kappa/lambda serum light chain ratio. If all of those will be negative I will order second round of tests. I will see Bryant in follow up in 2 weeks.   Conni Slipper, MD   07/11/2013 5:48 PM

## 2013-07-12 ENCOUNTER — Encounter (HOSPITAL_COMMUNITY): Payer: Self-pay | Admitting: Pharmacist

## 2013-07-12 LAB — SPEP & IFE WITH QIG
Albumin ELP: 52.8 % — ABNORMAL LOW (ref 55.8–66.1)
Alpha-1-Globulin: 6.5 % — ABNORMAL HIGH (ref 2.9–4.9)
Beta 2: 6.1 % (ref 3.2–6.5)
Beta Globulin: 6.3 % (ref 4.7–7.2)
IgA: 340 mg/dL (ref 69–380)

## 2013-07-12 LAB — DIRECT ANTIGLOBULIN TEST (NOT AT ARMC)
DAT (Complement): NEGATIVE
DAT IgG: NEGATIVE

## 2013-07-12 LAB — KAPPA/LAMBDA LIGHT CHAINS: Kappa:Lambda Ratio: 0.92 (ref 0.26–1.65)

## 2013-07-13 ENCOUNTER — Encounter (HOSPITAL_COMMUNITY): Payer: Self-pay

## 2013-07-13 ENCOUNTER — Encounter (HOSPITAL_COMMUNITY)
Admission: RE | Admit: 2013-07-13 | Discharge: 2013-07-13 | Disposition: A | Discharge status: Home / Self Care | Payer: Medicare Other | Source: Ambulatory Visit | Attending: Orthopedic Surgery | Admitting: Orthopedic Surgery

## 2013-07-13 ENCOUNTER — Other Ambulatory Visit (HOSPITAL_COMMUNITY): Payer: Self-pay | Admitting: Orthopedic Surgery

## 2013-07-13 DIAGNOSIS — Z01818 Encounter for other preprocedural examination: Secondary | ICD-10-CM | POA: Insufficient documentation

## 2013-07-13 DIAGNOSIS — Z01812 Encounter for preprocedural laboratory examination: Secondary | ICD-10-CM | POA: Insufficient documentation

## 2013-07-13 DIAGNOSIS — Z01810 Encounter for preprocedural cardiovascular examination: Secondary | ICD-10-CM | POA: Insufficient documentation

## 2013-07-13 LAB — APTT: aPTT: 27 seconds (ref 24–37)

## 2013-07-13 LAB — COMPREHENSIVE METABOLIC PANEL
ALT: 24 U/L (ref 0–35)
Albumin: 4 g/dL (ref 3.5–5.2)
Alkaline Phosphatase: 69 U/L (ref 39–117)
Potassium: 4.5 mEq/L (ref 3.5–5.1)
Sodium: 135 mEq/L (ref 135–145)
Total Protein: 7.7 g/dL (ref 6.0–8.3)

## 2013-07-13 LAB — CBC
MCHC: 34.9 g/dL (ref 30.0–36.0)
RDW: 12.1 % (ref 11.5–15.5)

## 2013-07-13 LAB — TYPE AND SCREEN: Antibody Screen: NEGATIVE

## 2013-07-13 LAB — SURGICAL PCR SCREEN: Staphylococcus aureus: NEGATIVE

## 2013-07-13 NOTE — Pre-Procedure Instructions (Signed)
Leslie Bryant  07/13/2013   Your procedure is scheduled on:  07/21/13  Report to [place] at 72 AM.  Call this number if you have problems the morning of surgery: [phone]   Remember:   Do not eat food or drink liquids after midnight.   Take these medicines the morning of surgery with A SIP OF WATER: vicodan,lopressor,prilosec   Do not wear jewelry, make-up or nail polish.  Do not wear lotions, powders, or perfumes. You may wear deodorant.  Do not shave 48 hours prior to surgery. Men may shave face and neck.  Do not bring valuables to the hospital.  [facility] is not responsible                   for any belongings or valuables.  Contacts, dentures or bridgework may not be worn into surgery.  Leave suitcase in the car. After surgery it may be brought to your room.  For patients admitted to the hospital, checkout time is 11:00 AM the day of  discharge.   Patients discharged the day of surgery will not be allowed to drive  home.  Name and phone number of your driver: family  Special Instructions: Shower using CHG 2 nights before surgery and the night before surgery.  If you shower the day of surgery use CHG.  Use special wash - you have one bottle of CHG for all showers.  You should use approximately 1/3 of the bottle for each shower.   Please read over the following fact sheets that you were given: Pain Booklet, Coughing and Deep Breathing, Blood Transfusion Information, MRSA Information and Surgical Site Infection Prevention

## 2013-07-14 ENCOUNTER — Telehealth: Payer: Self-pay | Admitting: Hematology and Oncology

## 2013-07-14 NOTE — Telephone Encounter (Signed)
Moved 9/16 appt w/CP2 to 9/15 w/NG. S/w pt she is aware of new d/t

## 2013-07-15 LAB — UIFE/LIGHT CHAINS/TP QN, 24-HR UR
Free Kappa Lt Chains,Ur: 0.65 mg/dL (ref 0.14–2.42)
Free Kappa/Lambda Ratio: 16.25 ratio — ABNORMAL HIGH (ref 2.04–10.37)
Free Lambda Excretion/Day: 1.12 mg/d
Free Lt Chn Excr Rate: 18.2 mg/d
Time: 24 hours
Total Protein, Urine: 1.2 mg/dL

## 2013-07-19 ENCOUNTER — Telehealth: Payer: Self-pay | Admitting: Hematology and Oncology

## 2013-07-19 ENCOUNTER — Ambulatory Visit (HOSPITAL_BASED_OUTPATIENT_CLINIC_OR_DEPARTMENT_OTHER): Payer: Medicare Other | Admitting: Hematology and Oncology

## 2013-07-19 VITALS — BP 147/73 | HR 84 | Temp 97.9°F | Resp 18 | Ht 62.5 in | Wt 154.8 lb

## 2013-07-19 DIAGNOSIS — D649 Anemia, unspecified: Secondary | ICD-10-CM

## 2013-07-19 NOTE — Telephone Encounter (Signed)
Per 9/15 pof no return visit - pt discharged from clinic.

## 2013-07-19 NOTE — Progress Notes (Signed)
[place] OFFICE PROGRESS NOTE  [name], MD [place] [address] [place] [id number]  DIAGNOSIS: Anemia, unspecified  SUMMARY OF ONCOLOGIC HISTORY:  INTERVAL HISTORY: Leslie Bryant 72 y.o. female returns for followup on the workup for anemia. I have reviewed the past medical history, past surgical history, social history and family history with the patient and they are unchanged from previous note.  ALLERGIES:  is allergic to crestor; lipitor; pravastatin; and penicillins.  MEDICATIONS:  Current outpatient prescriptions:bifidobacterium infantis (ALIGN) capsule, Take 1 capsule by mouth every morning. , Disp: , Rfl: ;  Calcium-Magnesium-Vitamin D (CALCIUM 1200+D3 PO), Take 1 tablet by mouth every morning., Disp: , Rfl: ;  celecoxib (CELEBREX) 200 MG capsule, Take 200 mg by mouth every morning. , Disp: , Rfl: ;  Cholecalciferol (VITAMIN D) 2000 UNITS tablet, Take 2,000 Units by mouth at bedtime., Disp: , Rfl:  co-enzyme Q-10 30 MG capsule, Take 30 mg by mouth 2 (two) times daily. , Disp: , Rfl: ;  ezetimibe (ZETIA) 10 MG tablet, Take 10 mg by mouth every evening., Disp: , Rfl: ;  HYDROcodone-acetaminophen (NORCO/VICODIN) 5-325 MG per tablet, Take 1 tablet by mouth every 6 (six) hours as needed for pain., Disp: , Rfl: ;  metoprolol (LOPRESSOR) 50 MG tablet, Take 0.5 tablets (25 mg total) by mouth 2 (two) times daily., Disp: 90 tablet, Rfl: 3 Multiple Vitamins-Minerals (CENTRUM SILVER PO), Take 1 tablet by mouth every morning. , Disp: , Rfl: ;  omeprazole (PRILOSEC) 40 MG capsule, Take 40 mg by mouth every morning. , Disp: , Rfl: ;  aspirin 81 MG tablet, Take 81 mg by mouth 2 (two) times daily., Disp: , Rfl: ;  fish oil-omega-3 fatty acids 1000 MG capsule, Take 1 g by mouth 2 (two) times daily., Disp: , Rfl:   REVIEW OF SYSTEMS:   Constitutional: Denies fevers, chills or abnormal weight loss All other systems were reviewed with the patient and are negative.  Filed Vitals:   07/19/13 0953  BP: 147/73  Pulse: 84  Temp: 97.9 F (36.6 C)  Resp: 18    GENERAL:alert, no distress and comfortable NEURO: alert & oriented x 3 with fluent speech, no focal motor/sensory deficits  LABORATORY DATA:  I have reviewed the data as listed from that date all 07/08/2013.  ASSESSMENT:  #1. Anemia, resolved I have reviewed all her test results for the last 2 months. I have discussed with her that is unclear to me the cause of her recent anemia, however it has resolved spontaneously. There is no need for further workup for this. #2 remote history of breast cancer The patient will continue yearly mammogram followup with her provider. She has no concern about risk of recurrence at this point in time. #3 degenerative joint disease The patient will be undergoing left knee replacement surgery this coming Wednesday. I recommend she hold Celebrex in case there would be additional risk of bleeding complications. I told the patient there is a possibility of the anemia after surgery but typically most patients would get by without need for blood transfusion.   PLAN:  I will discharge patient from the clinic. I reviewed with her that since the anemia has resolved this no need for the patient to return. All questions were answered. The patient knows to call the clinic with any problems, questions or concerns. We can certainly see the patient much sooner if necessary. No barriers to learning was detected. I spent 15 minutes to the maximum of  counseling the patient face to face. The total time spent in the appointment was 20 minutes and more than 50% was on counseling.     [place], Harris, MD 07/19/2013  10:20 AM

## 2013-07-20 ENCOUNTER — Ambulatory Visit: Payer: Medicare Other

## 2013-07-20 MED ORDER — BUPIVACAINE LIPOSOME 1.3 % IJ SUSP
20.0000 mL | Freq: Once | INTRAMUSCULAR | Status: DC
  Filled 2013-07-20: qty 20

## 2013-07-20 MED ORDER — CLINDAMYCIN PHOSPHATE 900 MG/50ML IV SOLN
900.0000 mg | INTRAVENOUS | Status: AC
  Administered 2013-07-21: 900 mg via INTRAVENOUS
  Filled 2013-07-20: qty 50

## 2013-07-21 ENCOUNTER — Inpatient Hospital Stay (HOSPITAL_COMMUNITY): Payer: Medicare Other | Admitting: Anesthesiology

## 2013-07-21 ENCOUNTER — Encounter (HOSPITAL_COMMUNITY)
Admission: RE | Disposition: A | Discharge status: Home Health | Payer: Self-pay | Source: Ambulatory Visit | Attending: Orthopedic Surgery

## 2013-07-21 ENCOUNTER — Inpatient Hospital Stay (HOSPITAL_COMMUNITY)
Admission: RE | Admit: 2013-07-21 | Discharge: 2013-07-23 | DRG: 470 | Disposition: A | Discharge status: Home Health | Payer: Medicare Other | Source: Ambulatory Visit | Attending: Orthopedic Surgery | Admitting: Orthopedic Surgery

## 2013-07-21 ENCOUNTER — Encounter (HOSPITAL_COMMUNITY): Payer: Self-pay | Admitting: *Deleted

## 2013-07-21 ENCOUNTER — Encounter (HOSPITAL_COMMUNITY): Payer: Self-pay | Admitting: Anesthesiology

## 2013-07-21 DIAGNOSIS — K219 Gastro-esophageal reflux disease without esophagitis: Secondary | ICD-10-CM | POA: Diagnosis present

## 2013-07-21 DIAGNOSIS — F3289 Other specified depressive episodes: Secondary | ICD-10-CM | POA: Diagnosis present

## 2013-07-21 DIAGNOSIS — Z806 Family history of leukemia: Secondary | ICD-10-CM

## 2013-07-21 DIAGNOSIS — M1710 Unilateral primary osteoarthritis, unspecified knee: Principal | ICD-10-CM | POA: Diagnosis present

## 2013-07-21 DIAGNOSIS — Z880 Allergy status to penicillin: Secondary | ICD-10-CM

## 2013-07-21 DIAGNOSIS — E7800 Pure hypercholesterolemia, unspecified: Secondary | ICD-10-CM | POA: Diagnosis present

## 2013-07-21 DIAGNOSIS — Z9089 Acquired absence of other organs: Secondary | ICD-10-CM

## 2013-07-21 DIAGNOSIS — Z888 Allergy status to other drugs, medicaments and biological substances status: Secondary | ICD-10-CM

## 2013-07-21 DIAGNOSIS — E785 Hyperlipidemia, unspecified: Secondary | ICD-10-CM | POA: Diagnosis present

## 2013-07-21 DIAGNOSIS — F411 Generalized anxiety disorder: Secondary | ICD-10-CM | POA: Diagnosis present

## 2013-07-21 DIAGNOSIS — Z23 Encounter for immunization: Secondary | ICD-10-CM

## 2013-07-21 DIAGNOSIS — Z8249 Family history of ischemic heart disease and other diseases of the circulatory system: Secondary | ICD-10-CM

## 2013-07-21 DIAGNOSIS — Z808 Family history of malignant neoplasm of other organs or systems: Secondary | ICD-10-CM

## 2013-07-21 DIAGNOSIS — F329 Major depressive disorder, single episode, unspecified: Secondary | ICD-10-CM | POA: Diagnosis present

## 2013-07-21 DIAGNOSIS — I10 Essential (primary) hypertension: Secondary | ICD-10-CM | POA: Diagnosis present

## 2013-07-21 DIAGNOSIS — Z853 Personal history of malignant neoplasm of breast: Secondary | ICD-10-CM

## 2013-07-21 DIAGNOSIS — Z800 Family history of malignant neoplasm of digestive organs: Secondary | ICD-10-CM

## 2013-07-21 DIAGNOSIS — Z801 Family history of malignant neoplasm of trachea, bronchus and lung: Secondary | ICD-10-CM

## 2013-07-21 DIAGNOSIS — Z9010 Acquired absence of unspecified breast and nipple: Secondary | ICD-10-CM

## 2013-07-21 DIAGNOSIS — K589 Irritable bowel syndrome without diarrhea: Secondary | ICD-10-CM | POA: Diagnosis present

## 2013-07-21 DIAGNOSIS — Z7982 Long term (current) use of aspirin: Secondary | ICD-10-CM

## 2013-07-21 DIAGNOSIS — Z79899 Other long term (current) drug therapy: Secondary | ICD-10-CM

## 2013-07-21 HISTORY — PX: TOTAL KNEE ARTHROPLASTY: SHX125

## 2013-07-21 LAB — GLUCOSE, CAPILLARY: Glucose-Capillary: 116 mg/dL — ABNORMAL HIGH (ref 70–99)

## 2013-07-21 SURGERY — ARTHROPLASTY, KNEE, TOTAL
Anesthesia: General | Site: Knee | Laterality: Left

## 2013-07-21 MED ORDER — PHENOL 1.4 % MT LIQD: 1.0000 | OROMUCOSAL | Status: DC | PRN

## 2013-07-21 MED ORDER — METOPROLOL TARTRATE 25 MG PO TABS
25.0000 mg | ORAL_TABLET | Freq: Two times a day (BID) | ORAL | Status: DC
  Administered 2013-07-21 – 2013-07-23 (×4): 25 mg via ORAL
  Filled 2013-07-21 (×5): qty 1

## 2013-07-21 MED ORDER — CELECOXIB 200 MG PO CAPS
200.0000 mg | ORAL_CAPSULE | Freq: Every morning | ORAL | Status: DC
  Administered 2013-07-22 – 2013-07-23 (×2): 200 mg via ORAL
  Filled 2013-07-21 (×2): qty 1

## 2013-07-21 MED ORDER — MENTHOL 3 MG MT LOZG: 1.0000 | LOZENGE | OROMUCOSAL | Status: DC | PRN

## 2013-07-21 MED ORDER — BISACODYL 5 MG PO TBEC: 5.0000 mg | DELAYED_RELEASE_TABLET | Freq: Every day | ORAL | Status: DC | PRN

## 2013-07-21 MED ORDER — METOCLOPRAMIDE HCL 5 MG/ML IJ SOLN
5.0000 mg | Freq: Three times a day (TID) | INTRAMUSCULAR | Status: DC | PRN
  Administered 2013-07-21: 10 mg via INTRAVENOUS
  Filled 2013-07-21: qty 2

## 2013-07-21 MED ORDER — KETOROLAC TROMETHAMINE 15 MG/ML IJ SOLN
7.5000 mg | Freq: Four times a day (QID) | INTRAMUSCULAR | Status: AC
  Administered 2013-07-21 – 2013-07-22 (×2): 7.5 mg via INTRAVENOUS
  Filled 2013-07-21 (×5): qty 1

## 2013-07-21 MED ORDER — ONDANSETRON HCL 4 MG PO TABS: 4.0000 mg | ORAL_TABLET | Freq: Four times a day (QID) | ORAL | Status: DC | PRN

## 2013-07-21 MED ORDER — ACETAMINOPHEN 325 MG PO TABS: 650.0000 mg | ORAL_TABLET | Freq: Four times a day (QID) | ORAL | Status: DC | PRN

## 2013-07-21 MED ORDER — METOCLOPRAMIDE HCL 10 MG PO TABS: 5.0000 mg | ORAL_TABLET | Freq: Three times a day (TID) | ORAL | Status: DC | PRN

## 2013-07-21 MED ORDER — PHENYLEPHRINE HCL 10 MG/ML IJ SOLN
INTRAMUSCULAR | Status: DC | PRN
  Administered 2013-07-21 (×2): 80 ug via INTRAVENOUS
  Administered 2013-07-21: 120 ug via INTRAVENOUS
  Administered 2013-07-21: 80 ug via INTRAVENOUS

## 2013-07-21 MED ORDER — SODIUM CHLORIDE 0.9 % IR SOLN
Status: DC | PRN
  Administered 2013-07-21: 1000 mL

## 2013-07-21 MED ORDER — PANTOPRAZOLE SODIUM 40 MG PO TBEC
40.0000 mg | DELAYED_RELEASE_TABLET | Freq: Every day | ORAL | Status: DC
  Administered 2013-07-22 – 2013-07-23 (×2): 40 mg via ORAL
  Filled 2013-07-21 (×2): qty 1

## 2013-07-21 MED ORDER — LIDOCAINE HCL (CARDIAC) 20 MG/ML IV SOLN
INTRAVENOUS | Status: DC | PRN
  Administered 2013-07-21: 50 mg via INTRAVENOUS

## 2013-07-21 MED ORDER — OXYCODONE HCL 5 MG PO TABS
5.0000 mg | ORAL_TABLET | ORAL | Status: DC | PRN
  Administered 2013-07-21 – 2013-07-23 (×8): 10 mg via ORAL
  Filled 2013-07-21 (×8): qty 2

## 2013-07-21 MED ORDER — SODIUM CHLORIDE 0.9 % IJ SOLN
INTRAMUSCULAR | Status: DC | PRN
  Administered 2013-07-21: 11:00:00

## 2013-07-21 MED ORDER — DOCUSATE SODIUM 100 MG PO CAPS
100.0000 mg | ORAL_CAPSULE | Freq: Two times a day (BID) | ORAL | Status: DC
  Administered 2013-07-22 – 2013-07-23 (×3): 100 mg via ORAL
  Filled 2013-07-21 (×5): qty 1

## 2013-07-21 MED ORDER — HYDROMORPHONE HCL PF 1 MG/ML IJ SOLN
1.0000 mg | INTRAMUSCULAR | Status: DC | PRN
  Administered 2013-07-21 (×2): 1 mg via INTRAVENOUS
  Filled 2013-07-21 (×2): qty 1

## 2013-07-21 MED ORDER — PROPOFOL 10 MG/ML IV BOLUS
INTRAVENOUS | Status: DC | PRN
  Administered 2013-07-21: 150 mg via INTRAVENOUS

## 2013-07-21 MED ORDER — HYDROMORPHONE HCL PF 1 MG/ML IJ SOLN
0.2500 mg | INTRAMUSCULAR | Status: DC | PRN
  Administered 2013-07-21 (×4): 0.5 mg via INTRAVENOUS

## 2013-07-21 MED ORDER — LACTATED RINGERS IV SOLN
INTRAVENOUS | Status: DC | PRN
  Administered 2013-07-21 (×2): via INTRAVENOUS

## 2013-07-21 MED ORDER — HYDROMORPHONE HCL PF 1 MG/ML IJ SOLN
INTRAMUSCULAR | Status: AC
  Filled 2013-07-21: qty 1

## 2013-07-21 MED ORDER — BUPIVACAINE LIPOSOME 1.3 % IJ SUSP
20.0000 mL | INTRAMUSCULAR | Status: DC
  Filled 2013-07-21: qty 20

## 2013-07-21 MED ORDER — SENNOSIDES-DOCUSATE SODIUM 8.6-50 MG PO TABS: 1.0000 | ORAL_TABLET | Freq: Every evening | ORAL | Status: DC | PRN

## 2013-07-21 MED ORDER — CLINDAMYCIN PHOSPHATE 600 MG/50ML IV SOLN
600.0000 mg | Freq: Four times a day (QID) | INTRAVENOUS | Status: AC
  Administered 2013-07-21 (×2): 600 mg via INTRAVENOUS
  Filled 2013-07-21 (×2): qty 50

## 2013-07-21 MED ORDER — HYDROMORPHONE HCL PF 1 MG/ML IJ SOLN
INTRAMUSCULAR | Status: DC | PRN
  Administered 2013-07-21: .5 mg via INTRAVENOUS

## 2013-07-21 MED ORDER — MAGNESIUM CITRATE PO SOLN: 1.0000 | Freq: Once | ORAL | Status: AC | PRN

## 2013-07-21 MED ORDER — ACETAMINOPHEN 650 MG RE SUPP: 650.0000 mg | Freq: Four times a day (QID) | RECTAL | Status: DC | PRN

## 2013-07-21 MED ORDER — ARTIFICIAL TEARS OP OINT
TOPICAL_OINTMENT | OPHTHALMIC | Status: DC | PRN
  Administered 2013-07-21: 1 via OPHTHALMIC

## 2013-07-21 MED ORDER — ONDANSETRON HCL 4 MG/2ML IJ SOLN
4.0000 mg | Freq: Four times a day (QID) | INTRAMUSCULAR | Status: DC | PRN
  Administered 2013-07-21: 4 mg via INTRAVENOUS

## 2013-07-21 MED ORDER — EZETIMIBE 10 MG PO TABS
10.0000 mg | ORAL_TABLET | Freq: Every evening | ORAL | Status: DC
Start: 2013-07-21 — End: 2013-07-23
  Administered 2013-07-22: 10 mg via ORAL
  Filled 2013-07-21 (×3): qty 1

## 2013-07-21 MED ORDER — FENTANYL CITRATE 0.05 MG/ML IJ SOLN
INTRAMUSCULAR | Status: DC | PRN
  Administered 2013-07-21: 50 ug via INTRAVENOUS
  Administered 2013-07-21 (×2): 25 ug via INTRAVENOUS
  Administered 2013-07-21: 100 ug via INTRAVENOUS
  Administered 2013-07-21: 50 ug via INTRAVENOUS

## 2013-07-21 MED ORDER — LACTATED RINGERS IV SOLN
INTRAVENOUS | Status: DC
  Administered 2013-07-21: 10:00:00 via INTRAVENOUS

## 2013-07-21 MED ORDER — MIDAZOLAM HCL 2 MG/2ML IJ SOLN
INTRAMUSCULAR | Status: AC
  Administered 2013-07-21: 2 mg via INTRAVENOUS
  Filled 2013-07-21: qty 2

## 2013-07-21 MED ORDER — SODIUM CHLORIDE 0.9 % IV SOLN
INTRAVENOUS | Status: DC
  Administered 2013-07-22: 20 mL/h via INTRAVENOUS

## 2013-07-21 MED ORDER — FENTANYL CITRATE 0.05 MG/ML IJ SOLN
INTRAMUSCULAR | Status: AC
  Administered 2013-07-21: 50 ug via INTRAVENOUS
  Filled 2013-07-21: qty 2

## 2013-07-21 MED ORDER — BUPIVACAINE-EPINEPHRINE PF 0.5-1:200000 % IJ SOLN
INTRAMUSCULAR | Status: DC | PRN
  Administered 2013-07-21: 30 mL

## 2013-07-21 MED ORDER — SODIUM CHLORIDE 0.9 % IJ SOLN
INTRAMUSCULAR | Status: AC
  Filled 2013-07-21: qty 3

## 2013-07-21 MED ORDER — BACID PO TABS
1.0000 | ORAL_TABLET | Freq: Every morning | ORAL | Status: DC
  Administered 2013-07-22 – 2013-07-23 (×2): 1 via ORAL
  Filled 2013-07-21 (×2): qty 1

## 2013-07-21 MED ORDER — ASPIRIN EC 325 MG PO TBEC
325.0000 mg | DELAYED_RELEASE_TABLET | Freq: Every day | ORAL | Status: DC
  Administered 2013-07-22 – 2013-07-23 (×2): 325 mg via ORAL
  Filled 2013-07-21 (×3): qty 1

## 2013-07-21 MED ORDER — ONDANSETRON HCL 4 MG/2ML IJ SOLN
INTRAMUSCULAR | Status: AC
  Administered 2013-07-21: 4 mg via INTRAVENOUS
  Filled 2013-07-21: qty 2

## 2013-07-21 SURGICAL SUPPLY — 56 items
BLADE SAGITTAL 25.0X1.27X90 (BLADE) ×2 IMPLANT
BLADE SAW SGTL 13.0X1.19X90.0M (BLADE) ×2 IMPLANT
BLADE SURG 21 STRL SS (BLADE) ×4 IMPLANT
BNDG COHESIVE 6X5 TAN STRL LF (GAUZE/BANDAGES/DRESSINGS) ×3 IMPLANT
BONE CEMENT PALACOSE (Orthopedic Implant) ×4 IMPLANT
BOWL SMART MIX CTS (DISPOSABLE) ×1 IMPLANT
CAP POR TM CP VIT E LN CER HD ×1 IMPLANT
CEMENT BONE PALACOSE (Orthopedic Implant) ×2 IMPLANT
CLOTH BEACON ORANGE TIMEOUT ST (SAFETY) ×2 IMPLANT
COVER SURGICAL LIGHT HANDLE (MISCELLANEOUS) ×2 IMPLANT
CUFF TOURNIQUET SINGLE 34IN LL (TOURNIQUET CUFF) ×1 IMPLANT
CUFF TOURNIQUET SINGLE 44IN (TOURNIQUET CUFF) IMPLANT
DRAPE EXTREMITY T 121X128X90 (DRAPE) ×2 IMPLANT
DRAPE PROXIMA HALF (DRAPES) ×2 IMPLANT
DRAPE U-SHAPE 47X51 STRL (DRAPES) ×2 IMPLANT
DRAPE U-SHAPE 76X120 STRL (DRAPES) ×1 IMPLANT
DRSG ADAPTIC 3X8 NADH LF (GAUZE/BANDAGES/DRESSINGS) ×2 IMPLANT
DRSG EMULSION OIL 3X3 NADH (GAUZE/BANDAGES/DRESSINGS) ×1 IMPLANT
DRSG PAD ABDOMINAL 8X10 ST (GAUZE/BANDAGES/DRESSINGS) ×2 IMPLANT
DURAPREP 26ML APPLICATOR (WOUND CARE) ×2 IMPLANT
ELECT REM PT RETURN 9FT ADLT (ELECTROSURGICAL) ×2
ELECTRODE REM PT RTRN 9FT ADLT (ELECTROSURGICAL) ×1 IMPLANT
FACESHIELD LNG OPTICON STERILE (SAFETY) ×3 IMPLANT
GLOVE BIO SURGEON STRL SZ7 (GLOVE) ×1 IMPLANT
GLOVE BIOGEL PI IND STRL 9 (GLOVE) ×1 IMPLANT
GLOVE BIOGEL PI INDICATOR 9 (GLOVE) ×1
GLOVE SURG ORTHO 9.0 STRL STRW (GLOVE) ×2 IMPLANT
GLOVE SURG SS PI 6.5 STRL IVOR (GLOVE) ×1 IMPLANT
GOWN PREVENTION PLUS XLARGE (GOWN DISPOSABLE) ×2 IMPLANT
GOWN SRG XL XLNG 56XLVL 4 (GOWN DISPOSABLE) ×2 IMPLANT
GOWN STRL NON-REIN XL XLG LVL4 (GOWN DISPOSABLE) ×4
HANDPIECE INTERPULSE COAX TIP (DISPOSABLE) ×2
KIT BASIN OR (CUSTOM PROCEDURE TRAY) ×2 IMPLANT
KIT ROOM TURNOVER OR (KITS) ×2 IMPLANT
MANIFOLD NEPTUNE II (INSTRUMENTS) ×2 IMPLANT
NDL SPNL 18GX3.5 QUINCKE PK (NEEDLE) ×1 IMPLANT
NEEDLE SPNL 18GX3.5 QUINCKE PK (NEEDLE) ×2 IMPLANT
NS IRRIG 1000ML POUR BTL (IV SOLUTION) ×3 IMPLANT
PACK TOTAL JOINT (CUSTOM PROCEDURE TRAY) ×2 IMPLANT
PAD ARMBOARD 7.5X6 YLW CONV (MISCELLANEOUS) ×4 IMPLANT
PADDING CAST ABS 6INX4YD NS (CAST SUPPLIES) ×1
PADDING CAST ABS COTTON 6X4 NS (CAST SUPPLIES) IMPLANT
PADDING CAST COTTON 6X4 STRL (CAST SUPPLIES) ×2 IMPLANT
SET HNDPC FAN SPRY TIP SCT (DISPOSABLE) IMPLANT
SPONGE GAUZE 4X4 12PLY (GAUZE/BANDAGES/DRESSINGS) ×2 IMPLANT
STAPLER VISISTAT 35W (STAPLE) ×2 IMPLANT
SUCTION FRAZIER TIP 10 FR DISP (SUCTIONS) IMPLANT
SUT VIC AB 0 CTB1 27 (SUTURE) ×2 IMPLANT
SUT VIC AB 1 CTX 36 (SUTURE) ×2
SUT VIC AB 1 CTX36XBRD ANBCTR (SUTURE) IMPLANT
SYR 50ML LL SCALE MARK (SYRINGE) ×1 IMPLANT
TOWEL OR 17X24 6PK STRL BLUE (TOWEL DISPOSABLE) ×2 IMPLANT
TOWEL OR 17X26 10 PK STRL BLUE (TOWEL DISPOSABLE) ×2 IMPLANT
TRAY FOLEY CATH 16FRSI W/METER (SET/KITS/TRAYS/PACK) IMPLANT
WATER STERILE IRR 1000ML POUR (IV SOLUTION) ×4 IMPLANT
WRAP KNEE MAXI GEL POST OP (GAUZE/BANDAGES/DRESSINGS) ×2 IMPLANT

## 2013-07-21 NOTE — Progress Notes (Signed)
Orthopedic Tech Progress Note Patient Details:  Leslie Bryant [birth year] [id number]  Ortho Devices Ortho Device/Splint Location: foot roll Ortho Device/Splint Interventions: Application   Cammer, Theodoro Parma 07/21/2013, 3:36 PM

## 2013-07-21 NOTE — Anesthesia Preprocedure Evaluation (Addendum)
Anesthesia Evaluation  Patient identified by MRN, date of birth, ID band Patient awake    Reviewed: Allergy & Precautions, H&P , NPO status , Patient's Chart, lab work & pertinent test results  Airway Mallampati: II      Dental   Pulmonary neg pulmonary ROS,  breath sounds clear to auscultation        Cardiovascular hypertension, Pt. on medications Rhythm:Regular Rate:Normal     Neuro/Psych    GI/Hepatic Neg liver ROS, hiatal hernia, GERD-  ,  Endo/Other    Renal/GU negative Renal ROS     Musculoskeletal   Abdominal   Peds  Hematology   Anesthesia Other Findings   Reproductive/Obstetrics                          Anesthesia Physical Anesthesia Plan  ASA: III  Anesthesia Plan: General   Post-op Pain Management: MAC Combined w/ Regional for Post-op pain   Induction: Intravenous  Airway Management Planned: Oral ETT  Additional Equipment:   Intra-op Plan:   Post-operative Plan: Extubation in OR  Informed Consent:   Plan Discussed with: CRNA, Anesthesiologist and Surgeon  Anesthesia Plan Comments:        Anesthesia Quick Evaluation

## 2013-07-21 NOTE — Op Note (Signed)
OPERATIVE REPORT  DATE OF SURGERY: 07/21/2013  PATIENT:  Leslie Bryant,  72 y.o. female  PRE-OPERATIVE DIAGNOSIS:  Osteoarthritis Left Knee  POST-OPERATIVE DIAGNOSIS:  Osteoarthritis Left Knee  PROCEDURE:  Procedure(s): TOTAL KNEE ARTHROPLASTY Zimmer components. Size C. tibia. Size 5 femur. 11 mm polyethylene posterior stabilized tray. 29 mm patella.  SURGEON:  Surgeon(s): Newt Minion, MD  ANESTHESIA:   regional and general  EBL:  min ML  SPECIMEN:  No Specimen  TOURNIQUET:   Total Tourniquet Time Documented: Thigh (Left) - 31 minutes Total: Thigh (Left) - 31 minutes   PROCEDURE DETAILS: Patient is a 72 year old woman osteoarthritis of her left knee she has failed conservative care has pain with activities of daily living and presents at this time for total knee arthroplasty. Risks and benefits were discussed including infection neurovascular injury persistent pain DVT pulmonary embolus need for additional surgery. Patient states she understands and wished to proceed at this time. Description of procedure patient was brought to the operating room and underwent a general anesthetic after femoral block. After adequate levels of anesthesia were obtained patient's left lower extremity was prepped using DuraPrep draped into a sterile field an [place] was used to cover all exposed skin. A medial incision was made carried down to a medial retinacular incision. The IM guide was used to take 10 mm off the distal femur. This was then sized for a size 5 femur narrow size and box cuts were made for 3 of external rotation. This was then focused on the tibia the knee was set to take 10 mm off the tibial tray 7 posterior slope neutral varus and valgus. The box cuts were made for the femur the patella was resurfaced with 10 mm taken off the patella for the 29 mm resurfacing button. Tibia and femoral components were trialed and patient had full extension with the 11 mm polyethylene tray. The lug  drill holes were made. The knee was irrigated with pulsatile lavage meniscus was removed all loose bodies were removed. The posterior aspect of the knee was injected with total of 60 cc of Exparel diluted 20 cc +40 cc of saline. The wound edges were also injected. Care was taken to not have an intravascular injection. The femoral and tibial components were cemented in place. The 11 mm poly-tray was inserted. The knee was left in extension until the cement hardened. The patella button was cemented in place. The knee was then placed through full range of motion after the cement hardened the patella tracked midline. The tourniquet was deflated after 31 minutes hemostasis was obtained. The retinaculum was closed using #1 Vicryl subcutaneous is closed using 0 Vicryl the skin was closed using staples. Wound is covered with Adaptic orthopedic sponges AB dressing web roll and Coban. Patient was extubated taken to stable condition.`  PLAN OF CARE: Admit to inpatient   PATIENT DISPOSITION:  PACU - hemodynamically stable.   Newt Minion, MD 07/21/2013 12:05 PM

## 2013-07-21 NOTE — Anesthesia Procedure Notes (Addendum)
Anesthesia Regional Block:  Femoral nerve block  Pre-Anesthetic Checklist: ,, timeout performed, Correct Patient, Correct Site, Correct Laterality, Correct Procedure, Correct Position, site marked, Risks and benefits discussed, pre-op evaluation,  At surgeon's request and post-op pain management  Laterality: Left  Prep: Maximum Sterile Barrier Precautions used and chloraprep       Needles:  Injection technique: Single-shot  Needle Type: Echogenic Stimulator Needle     Needle Length: 5cm 5 cm Needle Gauge: 22 and 22 G    Additional Needles:  Procedures: ultrasound guided (picture in chart) Femoral nerve block  Nerve Stimulator or Paresthesia:  Response: Patellar respose,   Additional Responses:   Narrative:  Start time: 07/21/2013 9:57 AM End time: 07/21/2013 10:09 AM Injection made incrementally with aspirations every 5 mL. Anesthesiologist: Fitzgerald,MD  Additional Notes: 2% Lidocaine skin wheel.   Femoral nerve block Procedure Name: LMA Insertion Date/Time: 07/21/2013 10:33 AM Performed by: Neldon Newport Pre-anesthesia Checklist: Timeout performed, Patient identified, Emergency Drugs available, Suction available and Patient being monitored Patient Re-evaluated:Patient Re-evaluated prior to inductionOxygen Delivery Method: Circle system utilized Preoxygenation: Pre-oxygenation with 100% oxygen Intubation Type: IV induction Ventilation: Mask ventilation without difficulty LMA: LMA inserted LMA Size: 4.0 Number of attempts: 1 Placement Confirmation: positive ETCO2 and breath sounds checked- equal and bilateral Tube secured with: Tape

## 2013-07-21 NOTE — Progress Notes (Signed)
Orthopedic Tech Progress Note Patient Details:  Leslie Bryant 1940/11/27 [id number]  Patient ID: [name], female   DOB: [birth year], 72 y.o.   MRN: [id number] Trapeze bar patient helper;footsie roll  Hildred Priest 07/21/2013, 1:28 PM

## 2013-07-21 NOTE — H&P (Signed)
TOTAL KNEE ADMISSION H&P  Patient is being admitted for left total knee arthroplasty.  Subjective:  Chief Complaint:left knee pain.  HPI: Leslie Bryant, 72 y.o. female, has a history of pain and functional disability in the left knee due to arthritis and has failed non-surgical conservative treatments for greater than 12 weeks to includeNSAID's and/or analgesics, corticosteriod injections, use of assistive devices and activity modification.  Onset of symptoms was gradual, starting 8 years ago with gradually worsening course since that time. The patient noted no past surgery on the left knee(s).  Patient currently rates pain in the left knee(s) at 8 out of 10 with activity. Patient has night pain, worsening of pain with activity and weight bearing, pain that interferes with activities of daily living, pain with passive range of motion, crepitus and joint swelling.  Patient has evidence of subchondral cysts, subchondral sclerosis, periarticular osteophytes and joint space narrowing by imaging studies. This patient has had Failure conservative care.. There is no active infection.  Patient Active Problem List   Diagnosis Date Noted  . Unspecified deficiency anemia 06/09/2013  . Anemia, unspecified 05/06/2013  . Lower back pain 02/01/2013  . Dysuria 09/15/2012  . Osteoarthritis of left knee 09/08/2012  . Complex tear of medial meniscus of left knee as current injury 09/08/2012  . Abdominal pain, lower 07/23/2012  . Preventative health care 05/05/2012  . Family hx of colon cancer 05/05/2012  . Diverticulitis of large intestine with perforation 11/02/2011  . Malaise and fatigue 08/14/2011  . Osteoarthritis, knee 02/06/2011  . History of breast cancer 02/06/2011  . Palpitations   . Supraventricular tachycardia   . EUSTACHIAN TUBE DYSFUNCTION, LEFT 04/11/2010  . ANXIETY 05/05/2008  . DEPRESSION 05/05/2008  . HYPERTENSION 05/05/2008  . ALLERGIC RHINITIS 05/05/2008  . DIVERTICULOSIS, COLON  05/05/2008  . IBS 05/05/2008  . OSTEOPENIA 05/05/2008  . COLONIC POLYPS, HX OF 05/05/2008  . HYPERLIPIDEMIA 05/29/2007  . GERD 05/29/2007   Past Medical History  Diagnosis Date  . Palpitations IRREGULAR HEARTBEAT--  CONTROLLED W/  BETA BLOCKER  . Hypercholesterolemia   . GERD (gastroesophageal reflux disease)   . Diverticulosis of colon     sigmoid  . H/O hiatal hernia   . Arthritis KNEES  . Acute meniscal tear of knee LEFT KNEE  . Left knee pain   . Swelling of left knee joint   . Hypertension     t. brackbill    Past Surgical History  Procedure Laterality Date  . Cardiac catheterization  12-22-2000  DR BRACKBILL    NORMAL LVF/ NORMAL CORONARY ARTERIES  . Vaginal hysterectomy  1966  . Breast recontruction w/ implants  1987  . Mastectomy  1986    RIGHT BREAST W/ MULTIPLE NODE DISSECTION  . Cholecystectomy  1988  . Knee arthroscopy  2011    RIGHT KNEE  . Knee arthroscopy with medial menisectomy  09/08/2012    Procedure: KNEE ARTHROSCOPY WITH MEDIAL MENISECTOMY;  Surgeon: Tobi Bastos, MD;  Location: [facility];  Service: Orthopedics;  Laterality: Left;  lateral tibial plateau,   . Chondroplasty  09/08/2012    Procedure: CHONDROPLASTY;  Surgeon: Tobi Bastos, MD;  Location: [facility];  Service: Orthopedics;  Laterality: Left;    No prescriptions prior to admission   Allergies  Allergen Reactions  . Crestor [Rosuvastatin Calcium] Other (See Comments)    Shoulder pain  . Lipitor [Atorvastatin Calcium] Other (See Comments)    Myalgias   . Pravastatin Other (See Comments)  myalgias  . Penicillins Itching    History  Substance Use Topics  . Smoking status: Never Smoker   . Smokeless tobacco: Never Used  . Alcohol Use: 0.0 oz/week     Comment: occ    Family History  Problem Relation Age of Onset  . Heart attack Father   . Heart failure Sister   . Colon cancer Brother     died/age 31  . Lung cancer Sister   . Brain  cancer Sister   . Cancer Brother     mouth  . Leukemia Brother      Review of Systems  All other systems reviewed and are negative.    Objective:  Physical Exam  Vital signs in last 24 hours:    Labs:   Estimated body mass index is 28.34 kg/(m^2) as calculated from the following:   Height as of 06/09/13: 5\' 2"  (1.575 m).   Weight as of 06/09/13: 70.308 kg (155 lb).   Imaging Review Plain radiographs demonstrate moderate degenerative joint disease of the left knee(s). The overall alignment ismild varus. The bone quality appears to be adequate for age and reported activity level.  Assessment/Plan:  End stage arthritis, left knee   The patient history, physical examination, clinical judgment of the provider and imaging studies are consistent with end stage degenerative joint disease of the left knee(s) and total knee arthroplasty is deemed medically necessary. The treatment options including medical management, injection therapy arthroscopy and arthroplasty were discussed at length. The risks and benefits of total knee arthroplasty were presented and reviewed. The risks due to aseptic loosening, infection, stiffness, patella tracking problems, thromboembolic complications and other imponderables were discussed. The patient acknowledged the explanation, agreed to proceed with the plan and consent was signed. Patient is being admitted for inpatient treatment for surgery, pain control, PT, OT, prophylactic antibiotics, VTE prophylaxis, progressive ambulation and ADL's and discharge planning. The patient is planning to be discharged home with home health services

## 2013-07-21 NOTE — Transfer of Care (Signed)
Immediate Anesthesia Transfer of Care Note  Patient: Leslie Bryant  Procedure(s) Performed: Procedure(s) with comments: TOTAL KNEE ARTHROPLASTY (Left) - Left Total Knee Arthroplasty  Patient Location: PACU  Anesthesia Type:General  Level of Consciousness: awake, alert  and oriented  Airway & Oxygen Therapy: Patient Spontanous Breathing and Patient connected to nasal cannula oxygen  Post-op Assessment: Report given to PACU RN, Post -op Vital signs reviewed and stable and Patient moving all extremities X 4  Post vital signs: Reviewed and stable  Complications: No apparent anesthesia complications

## 2013-07-21 NOTE — Anesthesia Postprocedure Evaluation (Signed)
  Anesthesia Post-op Note  Patient: Leslie Bryant  Procedure(s) Performed: Procedure(s) with comments: TOTAL KNEE ARTHROPLASTY (Left) - Left Total Knee Arthroplasty  Patient Location: PACU  Anesthesia Type:General  Level of Consciousness: awake  Airway and Oxygen Therapy: Patient Spontanous Breathing  Post-op Pain: mild  Post-op Assessment: Post-op Vital signs reviewed  Post-op Vital Signs: Reviewed  Complications: No apparent anesthesia complications

## 2013-07-21 NOTE — Preoperative (Signed)
Beta Blockers   Reason not to administer Beta Blockers:Not Applicable 

## 2013-07-22 ENCOUNTER — Encounter (HOSPITAL_COMMUNITY): Payer: Self-pay | Admitting: Orthopedic Surgery

## 2013-07-22 LAB — BASIC METABOLIC PANEL
Chloride: 96 mEq/L (ref 96–112)
GFR calc Af Amer: >90 mL/min (ref >90)
GFR calc non Af Amer: 81 mL/min — ABNORMAL LOW (ref >90)
Potassium: 4.1 mEq/L (ref 3.5–5.1)
Sodium: 130 mEq/L — ABNORMAL LOW (ref 135–145)

## 2013-07-22 LAB — CBC
HCT: 27.5 % — ABNORMAL LOW (ref 36.0–46.0)
MCHC: 33.8 g/dL (ref 30.0–36.0)
Platelets: 229 10*3/uL (ref 150–400)
RDW: 12.5 % (ref 11.5–15.5)
WBC: 8.1 10*3/uL (ref 4.0–10.5)

## 2013-07-22 MED ORDER — TRAMADOL HCL 50 MG PO TABS: 50.0000 mg | ORAL_TABLET | Freq: Four times a day (QID) | ORAL | Status: DC | PRN

## 2013-07-22 NOTE — Progress Notes (Signed)
Patient ID: Leslie Bryant, female   DOB: Nov 24, 1940, 72 y.o.   MRN: [id number] Postoperative day 1 left total knee arthroplasty. Patient states she is doing well. Possible discharge to home on Friday. Prescriptions on the chart for Ultram.

## 2013-07-22 NOTE — Progress Notes (Signed)
Physical Therapy Treatment Patient Details Name: Leslie Bryant MRN: [id number] DOB: October 03, 1941 Today's Date: 07/22/2013 Time: [id number] PT Time Calculation (min): 13 min  PT Assessment / Plan / Recommendation  History of Present Illness Pt. admitted for L TKA due to end stage OA   PT Comments   Pt. Recently back to bed and wanted to rest but willing to complete exercises.  Still appears to have some lingering effect from nerve block and reports mild numbness on top of thigh.  Progressing with exercises.  Follow Up Recommendations  Home health PT;Supervision/Assistance - 24 hour;Supervision for mobility/OOB;Other (comment)     Does the patient have the potential to tolerate intense rehabilitation     Barriers to Discharge        Equipment Recommendations  None recommended by PT    Recommendations for Other Services    Frequency 7X/week   Progress towards PT Goals Progress towards PT goals: Progressing toward goals  Plan Current plan remains appropriate    Precautions / Restrictions Precautions Precautions: Knee Restrictions Weight Bearing Restrictions: Yes LLE Weight Bearing: Weight bearing as tolerated   Pertinent Vitals/Pain See vitals tab     Mobility  Bed Mobility Bed Mobility: Not assessed (pt. recently back to bed to rest) Transfers Transfers: Not assessed Ambulation/Gait Ambulation/Gait Assistance: Not tested (comment)    Exercises Total Joint Exercises Ankle Circles/Pumps: AROM;Both;10 reps;Supine Quad Sets: AROM;Both;10 reps;Supine;Seated Gluteal Sets: AROM;Both;10 reps;Supine Short Arc Quad: AAROM;Left;10 reps;Supine Heel Slides: AAROM;Left;10 reps;Supine   PT Diagnosis:    PT Problem List:   PT Treatment Interventions:     PT Goals (current goals can now be found in the care plan section)    Visit Information  Last PT Received On: 07/22/13 Assistance Needed: +1 History of Present Illness: Pt. admitted for L TKA due to end stage OA    Subjective  Data  Subjective: reports some residual numbness on top of thigh from nerve block   Cognition  Cognition Arousal/Alertness: Awake/alert Behavior During Therapy: WFL for tasks assessed/performed Overall Cognitive Status: Within Functional Limits for tasks assessed    Balance     End of Session PT - End of Session Equipment Utilized During Treatment: Gait belt Activity Tolerance: Patient tolerated treatment well Patient left: in bed;with call bell/phone within reach Nurse Communication: Mobility status   GP     Ladona Ridgel 07/22/2013, 3:20 PM Gerlean Ren PT Acute Rehab Services [place] [phone]

## 2013-07-22 NOTE — Evaluation (Signed)
Occupational Therapy Evaluation Patient Details Name: Leslie Bryant MRN: [id number] DOB: 04/05/1941 Today's Date: 07/22/2013 Time: [id number] OT Time Calculation (min): 32 min  OT Assessment / Plan / Recommendation History of present illness Pt. admitted for L TKA due to end stage OA   Clinical Impression   Pt admitted with above thus affecting PLOF. Will continue to follow acutely in order to address below problem list in prep for return home.    OT Assessment  Patient needs continued OT Services    Follow Up Recommendations  No OT follow up;Supervision - Intermittent    Barriers to Discharge Decreased caregiver support intermittent assist from family during day (husband at work)  Optician, dispensing  3 in 1 bedside comode    Recommendations for Other Services    Frequency  Min 2X/week    Precautions / Restrictions Precautions Precautions: Knee Precaution Booklet Issued: Yes (comment) Precaution Comments: pt. provided exercise handout/precaution sheet; educated on QS, AP Restrictions Weight Bearing Restrictions: Yes LLE Weight Bearing: Weight bearing as tolerated   Pertinent Vitals/Pain See vitals    ADL  Upper Body Bathing: Simulated;Set up Where Assessed - Upper Body Bathing: Unsupported sitting Lower Body Bathing: Simulated;Moderate assistance Where Assessed - Lower Body Bathing: Supported sit to stand Upper Body Dressing: Performed;Set up Where Assessed - Upper Body Dressing: Unsupported sitting Lower Body Dressing: Performed;Moderate assistance Where Assessed - Lower Body Dressing: Supported sit to Lobbyist: Performed;Minimal Print production planner Method: Sit to Loss adjuster, chartered: Comfort height toilet;Grab bars Toileting - Clothing Manipulation and Hygiene: Performed;Minimal assistance Where Assessed - Best boy and Hygiene: Sit to stand from 3-in-1 or toilet Equipment Used: Gait belt;Rolling  walker Transfers/Ambulation Related to ADLs: min assist with RW. VCs for sequencing. ADL Comments: Pt with good flexion in R knee (~80 degrees) and will likely be able to reach forward while sitting EOB to perform LB dressing tasks.    OT Diagnosis: Generalized weakness;Acute pain  OT Problem List: Decreased strength;Decreased activity tolerance;Impaired balance (sitting and/or standing);Decreased knowledge of use of DME or AE;Pain OT Treatment Interventions: Self-care/ADL training;DME and/or AE instruction;Therapeutic activities;Patient/family education;Balance training   OT Goals(Current goals can be found in the care plan section) Acute Rehab OT Goals Patient Stated Goal: resume normal activities OT Goal Formulation: With patient Time For Goal Achievement: 07/29/13 Potential to Achieve Goals: Good  Visit Information  Last OT Received On: 07/22/13 Assistance Needed: +1 (second person for safety due to low BP) History of Present Illness: Pt. 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 Prior Function Level of Independence: Independent Communication Communication: No difficulties         Vision/Perception     Cognition  Cognition Arousal/Alertness: Awake/alert Behavior During Therapy: WFL for tasks assessed/performed Overall Cognitive Status: Within Functional Limits for tasks assessed    Extremity/Trunk Assessment Upper Extremity Assessment Upper Extremity Assessment: Overall WFL for tasks  assessed Lower Extremity Assessment Lower Extremity Assessment: LLE deficits/detail LLE Deficits / Details: good anlke pump, fair quad set; pt. with some knee buckling when initially up on RW Cervical /  Trunk Assessment Cervical / Trunk Assessment: Normal     Mobility Bed Mobility Bed Mobility: Supine to Sit;Sitting - Scoot to Edge of Bed Supine to Sit: 5: Supervision;With rails;HOB flat Sitting - Scoot to Edge of Bed: 5: Supervision Details for Bed Mobility Assistance: Cues for technique and safety Transfers Transfers: Sit to Stand;Stand to Sit Sit to Stand: 4: Min assist;From bed;With upper extremity assist;From toilet Stand to Sit: 4: Min assist;To chair/3-in-1;To toilet;With upper extremity assist;With armrests Details for Transfer Assistance: cues for technique and safety; cues for L LE placement ; min assist for safety and stability     Exercise Total Joint Exercises Ankle Circles/Pumps: AROM;Both;10 reps;Supine Quad Sets: AROM;Both;10 reps;Supine;Seated Goniometric ROM:    -7 to 80   Balance Balance Balance Assessed: Yes Dynamic Standing Balance Dynamic Standing - Balance Support: Bilateral upper extremity supported;During functional activity Dynamic Standing - Level of Assistance: 4: Min assist;Other (comment) (progressing to min guard)   End of Session OT - End of Session Equipment Utilized During Treatment: Gait belt;Rolling walker Activity Tolerance: Patient tolerated treatment well Patient left: in chair;with call bell/phone within reach Nurse Communication: Mobility status  GO   07/22/2013 Darrol Jump OTR/L Pager [phone] Office [phone]   Darrol Jump 07/22/2013, 11:47 AM

## 2013-07-22 NOTE — Progress Notes (Signed)
Utilization review completed. Jaclyn Carew, RN, BSN. 

## 2013-07-22 NOTE — Progress Notes (Signed)
07/22/13 Patient set up with HHPT with [facility] by MD office. Spoke with patient, no change in d/c plan.PT recommended 3N1, patient stated that she does not need one,has raised toilet seat and shower seat at home. No equipment needs identified. Fuller Plan RN, BSN, CCM

## 2013-07-22 NOTE — Evaluation (Signed)
Physical Therapy Evaluation Patient Details Name: Leslie Bryant MRN: [id number] DOB: 26-Oct-1941 Today's Date: 07/22/2013 Time: [id number] PT Time Calculation (min): 32 min  PT Assessment / Plan / Recommendation History of Present Illness  Pt. admitted for L TKA due to end stage OA  Clinical Impression  This patient underwent a left TKA and presents to PT with anticipated post-op decrease in strength and ROM, decreased functional mobility and gait.  Pt. Will benefit from acute PT to address these and below issues. Pt. Made good progress in session this am and had good pain control (?still lingering effects of nerve block?).  Anticipate DC home with HHPT and family assist.       PT Assessment  Patient needs continued PT services    Follow Up Recommendations  Home health PT;Supervision/Assistance - 24 hour;Supervision for mobility/OOB;Other (comment) (ideally 24 hr. supervision at least initially)    Does the patient have the potential to tolerate intense rehabilitation      Barriers to Discharge Decreased caregiver support family will be in and out but pt. will have times where she will be alone    Equipment Recommendations  None recommended by PT    Recommendations for Other Services     Frequency 7X/week    Precautions / Restrictions Precautions Precautions: Knee Precaution Booklet Issued: Yes (comment) Precaution Comments: pt. provided exercise handout/precaution sheet; educated on QS, AP Restrictions Weight Bearing Restrictions: Yes LLE Weight Bearing: Weight bearing as tolerated   Pertinent Vitals/Pain See vitals tab       Mobility  Bed Mobility Bed Mobility: Supine to Sit;Sitting - Scoot to Edge of Bed Supine to Sit: 5: Supervision;With rails;HOB flat Sitting - Scoot to Edge of Bed: 5: Supervision Details for Bed Mobility Assistance: pt. managing her won L LE for transition to sitting ; cues for technique and safety Transfers Transfers: Sit to Stand;Stand to  Sit Sit to Stand: 4: Min assist;From bed;With upper extremity assist;From toilet Stand to Sit: 4: Min assist;To chair/3-in-1;To toilet;With upper extremity assist;With armrests Details for Transfer Assistance: cues for technique and safety; cues for L LE placement ; min assist for safety and stability Ambulation/Gait Ambulation/Gait Assistance: 4: Min assist;4: Min guard Ambulation Distance (Feet): 50 Feet Assistive device: Rolling walker Ambulation/Gait Assistance Details: Pt. initially needed min assist for safety/ stability and for stabilizing L knee during weght bearing (she had some mild buckling).  By end of walk, pt. needed min guard assist for safety.  She needed frequent cueing for correct distannce to move RW ahead of her as she tended to keep it too close, decreasing her balance and safety. Gait Pattern: Step-to pattern (increased knee flexion in stance on L leg)    Exercises Total Joint Exercises Ankle Circles/Pumps: AROM;Both;10 reps;Supine Quad Sets: AROM;Both;10 reps;Supine;Seated Goniometric ROM:    -7 to 80   PT Diagnosis: Difficulty walking;Acute pain;Abnormality of gait  PT Problem List: Decreased strength;Decreased range of motion;Decreased activity tolerance;Decreased balance;Decreased mobility;Decreased knowledge of use of DME;Decreased knowledge of precautions;Pain PT Treatment Interventions: DME instruction;Gait training;Stair training;Functional mobility training;Therapeutic activities;Therapeutic exercise;Patient/family education     PT Goals(Current goals can be found in the care plan section) Acute Rehab PT Goals Patient Stated Goal: resume normal activities PT Goal Formulation: With patient Time For Goal Achievement: 07/29/13 Potential to Achieve Goals: Good  Visit Information  Last PT Received On: 07/22/13 Assistance Needed: +1 (second person for safety due to low BP) PT/OT Co-Evaluation/Treatment: Yes History of Present Illness: Pt. admitted for L TKA  due to  end stage OA       Prior [place] expects to be discharged to:: Private residence Living Arrangements: Spouse/significant other Available Help at Discharge: Family;Available PRN/intermittently Type of Home: House Home Access: Stairs to enter [employer] of Steps: 4 Entrance Stairs-Rails: Right Home Layout: One level;Other (Comment) (with sunken den (2steps down)) Home Equipment: Environmental consultant - 2 wheels Prior Function Level of Independence: Independent Communication Communication: No difficulties    Cognition  Cognition Arousal/Alertness: Awake/alert Behavior During Therapy: WFL for tasks assessed/performed Overall Cognitive Status: Within Functional Limits for tasks assessed    Extremity/Trunk Assessment Upper Extremity Assessment Upper Extremity Assessment: Overall WFL for tasks assessed Lower Extremity Assessment Lower Extremity Assessment: LLE deficits/detail LLE Deficits / Details: good anlke pump, fair quad set; pt. with some knee buckling when initially up on RW Cervical / Trunk Assessment Cervical / Trunk Assessment: Normal   Balance Balance Balance Assessed: Yes Dynamic Standing Balance Dynamic Standing - Balance Support: Bilateral upper extremity supported;During functional activity Dynamic Standing - Level of Assistance: 4: Min assist;Other (comment) (progressing to min guard)  End of Session PT - End of Session Equipment Utilized During Treatment: Gait belt Activity Tolerance: Patient tolerated treatment well Patient left: in chair;with call bell/phone within reach Nurse Communication: Mobility status  GP     Ladona Ridgel 07/22/2013, 9:14 AM Gerlean Ren PT Acute Rehab Services [phone] [place] [phone]

## 2013-07-23 LAB — CBC
HCT: 24.4 % — ABNORMAL LOW (ref 36.0–46.0)
Hemoglobin: 8.3 g/dL — ABNORMAL LOW (ref 12.0–15.0)
MCHC: 34 g/dL (ref 30.0–36.0)
RBC: 2.61 MIL/uL — ABNORMAL LOW (ref 3.87–5.11)
WBC: 9.1 10*3/uL (ref 4.0–10.5)

## 2013-07-23 MED ORDER — INFLUENZA VAC SPLIT QUAD 0.5 ML IM SUSP
0.5000 mL | INTRAMUSCULAR | Status: AC | PRN
  Administered 2013-07-23: 0.5 mL via INTRAMUSCULAR
  Filled 2013-07-23: qty 0.5

## 2013-07-23 NOTE — Progress Notes (Signed)
Physical Therapy Treatment Patient Details Name: Leslie Bryant MRN: [id number] DOB: 1940-12-19 Today's Date: 07/23/2013 Time: [id number] PT Time Calculation (min): 23 min  PT Assessment / Plan / Recommendation  History of Present Illness Pt. admitted for L TKA due to end stage OA   PT Comments   Pt. Appears somewhat stiffer in left knee today but ROM increased to 90 in seated position.  Pt. Continues to progress in PT.  Will need HHPT once [place] home.    Follow Up Recommendations  Home health PT;Supervision/Assistance - 24 hour;Supervision for mobility/OOB;Other (comment)     Does the patient have the potential to tolerate intense rehabilitation     Barriers to Discharge        Equipment Recommendations  None recommended by PT    Recommendations for Other Services    Frequency 7X/week   Progress towards PT Goals Progress towards PT goals: Progressing toward goals  Plan Current plan remains appropriate    Precautions / Restrictions Precautions Precautions: Knee Restrictions Weight Bearing Restrictions: Yes LLE Weight Bearing: Weight bearing as tolerated   Pertinent Vitals/Pain See vitals tab     Mobility  Bed Mobility Bed Mobility: Not assessed (pt. in recliner) Ambulation/Gait Ambulation/Gait Assistance: Not tested (comment)    Exercises Total Joint Exercises Ankle Circles/Pumps: AROM;Both;10 reps Quad Sets: AROM;Both;10 reps;Seated Gluteal Sets: AROM;Both;10 reps;Seated Short Arc Quad: AAROM;Left;10 reps;Seated Long Arc Quad: AAROM;AROM;Left;10 reps;Seated Knee Flexion: AROM;10 reps;Seated Goniometric ROM: -4 to 90   PT Diagnosis:    PT Problem List:   PT Treatment Interventions:     PT Goals (current goals can now be found in the care plan section)    Visit Information  Last PT Received On: 07/23/13 Assistance Needed: +1 History of Present Illness: Pt. admitted for L TKA due to end stage OA    Subjective Data      Cognition   Cognition Arousal/Alertness: Awake/alert Behavior During Therapy: WFL for tasks assessed/performed Overall Cognitive Status: Within Functional Limits for tasks assessed    Balance     End of Session PT - End of Session Activity Tolerance: Patient tolerated treatment well Patient left: in chair;with call bell/phone within reach;with family/visitor present;with nursing/sitter in room Nurse Communication: Mobility status   GP     Ladona Ridgel 07/23/2013, 1:26 PM Gerlean Ren PT Acute Rehab Services [place] [phone]

## 2013-07-23 NOTE — Discharge Summary (Signed)
Patient ID: Leslie Bryant MRN: [id number] DOB/AGE: 73-07-1941 73 y.o.  Admit date: 07/21/2013 Discharge date: 07/23/2013  Admission Diagnoses:  Active Problems:   * No active hospital problems. *   Discharge Diagnoses:  Same  Past Medical History  Diagnosis Date  . Palpitations IRREGULAR HEARTBEAT--  CONTROLLED W/  BETA BLOCKER  . Hypercholesterolemia   . GERD (gastroesophageal reflux disease)   . Diverticulosis of colon     sigmoid  . H/O hiatal hernia   . Arthritis KNEES  . Acute meniscal tear of knee LEFT KNEE  . Left knee pain   . Swelling of left knee joint   . Hypertension     t. brackbill    Surgeries: Procedure(s): TOTAL KNEE ARTHROPLASTY on 07/21/2013   Consultants:    Discharged Condition: Improved  Hospital Course: Leslie Bryant is an 72 y.o. female who was admitted 07/21/2013 for operative treatment of<principal problem not specified>. Patient has severe unremitting pain that affects sleep, daily activities, and work/hobbies. After pre-op clearance the patient was taken to the operating room on 07/21/2013 and underwent  Procedure(s): TOTAL KNEE ARTHROPLASTY.    Patient was given perioperative antibiotics: Anti-infectives   Start     Dose/Rate Route Frequency Ordered Stop   07/21/13 1700  clindamycin (CLEOCIN) IVPB 600 mg     600 mg 100 mL/hr over 30 Minutes Intravenous Every 6 hours 07/21/13 1542 07/22/13 0014   07/21/13 0600  clindamycin (CLEOCIN) IVPB 900 mg     900 mg 100 mL/hr over 30 Minutes Intravenous On call to O.R. 07/20/13 1409 07/21/13 1036       Patient was given sequential compression devices, early ambulation, and chemoprophylaxis to prevent DVT.  Patient benefited maximally from hospital stay and there were no complications.    Recent vital signs: Patient Vitals for the past 24 hrs:  BP Temp Pulse Resp SpO2  07/22/13 2050 109/51 mmHg 99.5 F (37.5 C) 105 20 97 %  07/22/13 1600 - - - 20 -  07/22/13 1200 - - - 20 -  07/22/13 1050  107/60 mmHg 98.7 F (37.1 C) 102 16 100 %  07/22/13 0800 - - - 18 98 %     Recent laboratory studies:  Recent Labs  07/22/13 0625  WBC 8.1  HGB 9.3*  HCT 27.5*  PLT 229  NA 130*  K 4.1  CL 96  CO2 24  BUN 18  CREATININE 0.79  GLUCOSE 133*  CALCIUM 8.6     Discharge Medications:     Medication List         aspirin 81 MG tablet  Take 81 mg by mouth 2 (two) times daily.     bifidobacterium infantis capsule  Take 1 capsule by mouth every morning.     CALCIUM 1200+D3 PO  Take 1 tablet by mouth every morning.     celecoxib 200 MG capsule  Commonly known as:  CELEBREX  Take 200 mg by mouth every morning.     CENTRUM SILVER PO  Take 1 tablet by mouth every morning.     co-enzyme Q-10 30 MG capsule  Take 30 mg by mouth 2 (two) times daily.     ezetimibe 10 MG tablet  Commonly known as:  ZETIA  Take 10 mg by mouth every evening.     fish oil-omega-3 fatty acids 1000 MG capsule  Take 1 g by mouth 2 (two) times daily.     HYDROcodone-acetaminophen 5-325 MG per tablet  Commonly known as:  NORCO/VICODIN  Take 1 tablet by mouth every 6 (six) hours as needed for pain.     metoprolol 50 MG tablet  Commonly known as:  LOPRESSOR  Take 0.5 tablets (25 mg total) by mouth 2 (two) times daily.     omeprazole 40 MG capsule  Commonly known as:  PRILOSEC  Take 40 mg by mouth every morning.     traMADol 50 MG tablet  Commonly known as:  ULTRAM  Take 1 tablet (50 mg total) by mouth every 6 (six) hours as needed for pain. Maximum dose= 8 tablets per day     Vitamin D 2000 UNITS tablet  Take 2,000 Units by mouth at bedtime.        Diagnostic Studies: Dg Chest 2 View  07/13/2013   CLINICAL DATA:  Preop osteoarthritis, total knee replacement. Hypertension.  EXAM: CHEST  2 VIEW  COMPARISON:  None.  FINDINGS: Surgical changes in the right chest wall. Heart is normal size. Mild biapical scarring. Lungs otherwise clear. No effusions. No acute bony abnormality.  IMPRESSION: No  active cardiopulmonary disease.   Electronically Signed   By: Rolm Baptise M.D.   On: 07/13/2013 14:00    Disposition: 01-Home or Self Care      Discharge Orders   Future Orders Complete By Expires   Call MD / Call 911  As directed    Comments:     If you experience chest pain or shortness of breath, CALL 911 and be transported to the hospital emergency room.  If you develope a fever above 101 F, pus (white drainage) or increased drainage or redness at the wound, or calf pain, call your surgeon's office.   Call MD / Call 911  As directed    Comments:     If you experience chest pain or shortness of breath, CALL 911 and be transported to the hospital emergency room.  If you develope a fever above 101 F, pus (white drainage) or increased drainage or redness at the wound, or calf pain, call your surgeon's office.   Constipation Prevention  As directed    Comments:     Drink plenty of fluids.  Prune juice may be helpful.  You may use a stool softener, such as Colace (over the counter) 100 mg twice a day.  Use MiraLax (over the counter) for constipation as needed.   Constipation Prevention  As directed    Comments:     Drink plenty of fluids.  Prune juice may be helpful.  You may use a stool softener, such as Colace (over the counter) 100 mg twice a day.  Use MiraLax (over the counter) for constipation as needed.   Diet - low sodium heart healthy  As directed    Diet - low sodium heart healthy  As directed    Discharge patient  As directed    Increase activity slowly as tolerated  As directed    Increase activity slowly as tolerated  As directed       Follow-up Information   Follow up with DUDA,MARCUS V, MD In 1 week.   Specialty:  Orthopedic Surgery   Contact information:   [place] [address] [phone]        Signed: Mcarthur Rossetti 07/23/2013, 6:59 AM

## 2013-07-23 NOTE — Progress Notes (Signed)
Patient ID: Leslie Bryant, female   DOB: 10-27-1941, 72 y.o.   MRN: [id number] Looks good.  Can discharge to home today.

## 2013-07-23 NOTE — Progress Notes (Signed)
Physical Therapy Treatment Patient Details Name: Leslie Bryant MRN: [id number] DOB: July 21, 1941 Today's Date: 07/23/2013 Time: [id number] PT Time Calculation (min): 38 min  PT Assessment / Plan / Recommendation  History of Present Illness Pt. admitted for L TKA due to end stage OA   PT Comments   Pt. Making good gains in therapy.  Will see again to check her on her exercises then anticipate DC home.    Follow Up Recommendations  Home health PT;Supervision/Assistance - 24 hour;Supervision for mobility/OOB;Other (comment)     Does the patient have the potential to tolerate intense rehabilitation     Barriers to Discharge        Equipment Recommendations  None recommended by PT    Recommendations for Other Services    Frequency 7X/week   Progress towards PT Goals Progress towards PT goals: Progressing toward goals  Plan Current plan remains appropriate    Precautions / Restrictions Precautions Precautions: Knee Restrictions Weight Bearing Restrictions: Yes LLE Weight Bearing: Weight bearing as tolerated   Pertinent Vitals/Pain See vitals tab     Mobility  Bed Mobility Bed Mobility: Supine to Sit;Sitting - Scoot to Edge of Bed Supine to Sit: 6: Modified independent (Device/Increase time);HOB flat Sitting - Scoot to Edge of Bed: 6: Modified independent (Device/Increase time) Details for Bed Mobility Assistance: managing on her own Transfers Transfers: Sit to Stand;Stand to Sit Sit to Stand: 6: Modified independent (Device/Increase time);From bed;With upper extremity assist Stand to Sit: 6: Modified independent (Device/Increase time);With upper extremity assist;With armrests;To chair/3-in-1 Details for Transfer Assistance: demonstrated appropriate technique and good safety Ambulation/Gait Ambulation/Gait Assistance: 6: Modified independent (Device/Increase time) Ambulation Distance (Feet): 400 Feet Assistive device: Rolling walker Ambulation/Gait Assistance Details:  occasional reminder needed for RW placement and step length but by end os session she was managing this appropriately on her won Gait Pattern: Step-through pattern Stairs: Yes Stairs Assistance: 4: Min guard Stair Management Technique: One rail Right;Step to pattern;Forwards Number of Stairs: 5    Exercises Total Joint Exercises Ankle Circles/Pumps: AROM;Both;10 reps;Supine Quad Sets: AROM;Both;10 reps;Supine   PT Diagnosis:    PT Problem List:   PT Treatment Interventions:     PT Goals (current goals can now be found in the care plan section) Acute Rehab PT Goals Patient Stated Goal: resume normal activities  Visit Information  Last PT Received On: 07/23/13 Assistance Needed: +1 History of Present Illness: Pt. admitted for L TKA due to end stage OA    Subjective Data  Subjective: Pt. reports her knee feels better post-operatively than it did prior to surgery Patient Stated Goal: resume normal activities   Cognition  Cognition Arousal/Alertness: Awake/alert Behavior During Therapy: WFL for tasks assessed/performed Overall Cognitive Status: Within Functional Limits for tasks assessed    Balance     End of Session PT - End of Session Equipment Utilized During Treatment: Gait belt Activity Tolerance: Patient tolerated treatment well Patient left: in chair;with call bell/phone within reach Nurse Communication: Mobility status   GP     Ladona Ridgel 07/23/2013, 8:56 AM [place] [phone]

## 2013-07-23 NOTE — Progress Notes (Addendum)
Patient d/c to home, IV removed, prescriptions given and d/c instructions reviewed.  No equipment needed, HHPT in place.

## 2013-08-10 ENCOUNTER — Ambulatory Visit (INDEPENDENT_AMBULATORY_CARE_PROVIDER_SITE_OTHER): Payer: Medicare Other | Admitting: Internal Medicine

## 2013-08-10 ENCOUNTER — Encounter: Payer: Self-pay | Admitting: Internal Medicine

## 2013-08-10 VITALS — BP 122/70 | HR 119 | Temp 97.6°F | Wt 149.2 lb

## 2013-08-10 DIAGNOSIS — IMO0002 Reserved for concepts with insufficient information to code with codable children: Secondary | ICD-10-CM

## 2013-08-10 DIAGNOSIS — M1710 Unilateral primary osteoarthritis, unspecified knee: Secondary | ICD-10-CM

## 2013-08-10 DIAGNOSIS — R300 Dysuria: Secondary | ICD-10-CM

## 2013-08-10 DIAGNOSIS — M1712 Unilateral primary osteoarthritis, left knee: Secondary | ICD-10-CM

## 2013-08-10 DIAGNOSIS — I10 Essential (primary) hypertension: Secondary | ICD-10-CM

## 2013-08-10 LAB — POCT URINALYSIS DIPSTICK
Glucose, UA: NEGATIVE
Ketones, UA: NEGATIVE
Protein, UA: NEGATIVE
Spec Grav, UA: 1.01

## 2013-08-10 MED ORDER — SULFAMETHOXAZOLE-TRIMETHOPRIM 800-160 MG PO TABS: 1.0000 | ORAL_TABLET | Freq: Two times a day (BID) | ORAL | Status: DC

## 2013-08-10 NOTE — Patient Instructions (Signed)
Please take all new medication as prescribed - the antibiotic Please continue all other medications as before The specimen will be sent for culture and you should be notified if any changes are needed in your treatment Please have the pharmacy call with any other refills you may need.  Please remember to sign up for My Chart if you have not done so, as this will be important to you in the future with finding out test results, communicating by private email, and scheduling acute appointments online when needed.

## 2013-08-10 NOTE — Progress Notes (Signed)
Subjective:    Patient ID: Leslie Bryant, female    DOB: [birth year], 72 y.o.   MRN: [id number]  HPI  Here with 1 day onset gu symptoms of dysuria, frequency, urgency, but no flank pain, hematuria or n/v, fever, chills.  S/p Recent left knee TKA x 3 wks.  No foley cath during the procedure per pt.  Pt denies chest pain, increased sob or doe, wheezing, orthopnea, PND, increased LE swelling, palpitations, dizziness or syncope.  Pt denies new neurological symptoms such as new headache, or facial or extremity weakness or numbness   Past Medical History  Diagnosis Date  . Palpitations IRREGULAR HEARTBEAT--  CONTROLLED W/  BETA BLOCKER  . Hypercholesterolemia   . GERD (gastroesophageal reflux disease)   . Diverticulosis of colon     sigmoid  . H/O hiatal hernia   . Arthritis KNEES  . Acute meniscal tear of knee LEFT KNEE  . Left knee pain   . Swelling of left knee joint   . Hypertension     t. brackbill   Past Surgical History  Procedure Laterality Date  . Cardiac catheterization  12-22-2000  DR BRACKBILL    NORMAL LVF/ NORMAL CORONARY ARTERIES  . Vaginal hysterectomy  1966  . Breast recontruction w/ implants  1987  . Mastectomy  1986    RIGHT BREAST W/ MULTIPLE NODE DISSECTION  . Cholecystectomy  1988  . Knee arthroscopy  2011    RIGHT KNEE  . Knee arthroscopy with medial menisectomy  09/08/2012    Procedure: KNEE ARTHROSCOPY WITH MEDIAL MENISECTOMY;  Surgeon: Tobi Bastos, MD;  Location: [facility];  Service: Orthopedics;  Laterality: Left;  lateral tibial plateau,   . Chondroplasty  09/08/2012    Procedure: CHONDROPLASTY;  Surgeon: Tobi Bastos, MD;  Location: [facility];  Service: Orthopedics;  Laterality: Left;  . Total knee arthroplasty Left 07/21/2013    Procedure: TOTAL KNEE ARTHROPLASTY;  Surgeon: Newt Minion, MD;  Location: [place];  Service: Orthopedics;  Laterality: Left;  Left Total Knee Arthroplasty    reports that she has never smoked.  She has never used smokeless tobacco. She reports that  drinks alcohol. She reports that she does not use illicit drugs. family history includes Brain cancer in her sister; Cancer in her brother; Colon cancer in her brother; Heart attack in her father; Heart failure in her sister; Leukemia in her brother; Lung cancer in her sister. Allergies  Allergen Reactions  . Crestor [Rosuvastatin Calcium] Other (See Comments)    Shoulder pain  . Lipitor [Atorvastatin Calcium] Other (See Comments)    Myalgias   . Pravastatin Other (See Comments)    myalgias  . Penicillins Itching   Current Outpatient Prescriptions on File Prior to Visit  Medication Sig Dispense Refill  . aspirin 81 MG tablet Take 81 mg by mouth 2 (two) times daily.      . bifidobacterium infantis (ALIGN) capsule Take 1 capsule by mouth every morning.       . Calcium-Magnesium-Vitamin D (CALCIUM 1200+D3 PO) Take 1 tablet by mouth every morning.      . celecoxib (CELEBREX) 200 MG capsule Take 200 mg by mouth every morning.       . Cholecalciferol (VITAMIN D) 2000 UNITS tablet Take 2,000 Units by mouth at bedtime.      Marland Kitchen co-enzyme Q-10 30 MG capsule Take 30 mg by mouth 2 (two) times daily.       Marland Kitchen ezetimibe (ZETIA) 10 MG  tablet Take 10 mg by mouth every evening.      . fish oil-omega-3 fatty acids 1000 MG capsule Take 1 g by mouth 2 (two) times daily.      . metoprolol (LOPRESSOR) 50 MG tablet Take 0.5 tablets (25 mg total) by mouth 2 (two) times daily.  90 tablet  3  . Multiple Vitamins-Minerals (CENTRUM SILVER PO) Take 1 tablet by mouth every morning.       Marland Kitchen omeprazole (PRILOSEC) 40 MG capsule Take 40 mg by mouth every morning.       . traMADol (ULTRAM) 50 MG tablet Take 1 tablet (50 mg total) by mouth every 6 (six) hours as needed for pain. Maximum dose= 8 tablets per day  60 tablet  0   No current facility-administered medications on file prior to visit.   Review of Systems  Constitutional: Negative for unexpected weight change,  or unusual diaphoresis  HENT: Negative for tinnitus.   Eyes: Negative for photophobia and visual disturbance.  Respiratory: Negative for choking and stridor.   Gastrointestinal: Negative for vomiting and blood in stool.  Genitourinary: Negative for hematuria and decreased urine volume.  Musculoskeletal: Negative for acute joint swelling Skin: Negative for color change and wound.  Neurological: Negative for tremors and numbness other than noted  Psychiatric/Behavioral: Negative for decreased concentration or  hyperactivity.       Objective:   Physical Exam BP 122/70  Pulse 119  Temp(Src) 97.6 F (36.4 C) (Oral)  Wt 149 lb 4 oz (67.699 kg)  BMI 27.29 kg/m2  SpO2 95% VS noted, mild ill Constitutional: Pt appears well-developed and well-nourished.  HENT: Head: NCAT.  Right Ear: External ear normal.  Left Ear: External ear normal.  Eyes: Conjunctivae and EOM are normal. Pupils are equal, round, and reactive to light.  Neck: Normal range of motion. Neck supple.  Cardiovascular: Normal rate and regular rhythm.   Pulmonary/Chest: Effort normal and breath sounds normal.  Abd:  Soft, non-distended, + BS, + tender low mid abd, no guarding or rebound Neurological: Pt is alert. Not confused  Skin: Skin is warm. No erythema.  Psychiatric: Pt behavior is normal. Thought content normal.         Assessment & Plan:

## 2013-08-10 NOTE — Assessment & Plan Note (Signed)
stable overall by history and exam, recent data reviewed with pt, and pt to continue medical treatment as before,  to f/u any worsening symptoms or concerns BP Readings from Last 3 Encounters:  08/10/13 122/70  07/23/13 126/62  07/23/13 126/62

## 2013-08-10 NOTE — Assessment & Plan Note (Signed)
prob uti, Mild to mod, for antibx course,  to f/u any worsening symptoms or concerns, for urine studies and cx

## 2013-08-10 NOTE — Assessment & Plan Note (Signed)
S/p recent surgury no change,  to f/u any worsening symptoms or concerns

## 2013-08-11 ENCOUNTER — Other Ambulatory Visit (INDEPENDENT_AMBULATORY_CARE_PROVIDER_SITE_OTHER): Payer: Medicare Other

## 2013-08-11 ENCOUNTER — Other Ambulatory Visit: Payer: Medicare Other

## 2013-08-11 DIAGNOSIS — R300 Dysuria: Secondary | ICD-10-CM

## 2013-08-11 LAB — URINALYSIS, ROUTINE W REFLEX MICROSCOPIC
Bilirubin Urine: NEGATIVE
Nitrite: NEGATIVE
Total Protein, Urine: NEGATIVE
pH: 6 (ref 5.0–8.0)

## 2013-08-13 LAB — URINE CULTURE: Colony Count: >=100000

## 2013-08-25 ENCOUNTER — Other Ambulatory Visit: Payer: Self-pay | Admitting: Cardiology

## 2013-09-04 ENCOUNTER — Ambulatory Visit (INDEPENDENT_AMBULATORY_CARE_PROVIDER_SITE_OTHER): Payer: Medicare Other | Admitting: Internal Medicine

## 2013-09-04 ENCOUNTER — Encounter: Payer: Self-pay | Admitting: Internal Medicine

## 2013-09-04 VITALS — BP 122/82 | HR 91 | Temp 99.3°F | Ht 62.5 in | Wt 147.1 lb

## 2013-09-04 DIAGNOSIS — R350 Frequency of micturition: Secondary | ICD-10-CM

## 2013-09-04 DIAGNOSIS — N390 Urinary tract infection, site not specified: Secondary | ICD-10-CM | POA: Insufficient documentation

## 2013-09-04 MED ORDER — CIPROFLOXACIN HCL 250 MG PO TABS: 250.0000 mg | ORAL_TABLET | Freq: Two times a day (BID) | ORAL | Status: DC

## 2013-09-04 NOTE — Progress Notes (Signed)
Chief Complaint  Patient presents with  . Urinary Tract Infection    urinary pain, lower back pain X started last night- pt took AZO last night so I cant read the strip (everything is orange)    HPI: Patient comes in today for [facility] Saturday clinic for  new problem evaluation. Acute onset   Last pm of dysuria  Yesterday am LBP  advil .   ? If low grade since knee surgery. Azo helped.  Took azo  Today  Last uti e coli 10 14 rx with septra ds  Had ? If ever go t totally bette from last  uti  No hematuria  ROS: See pertinent positives and negatives per HPI. No nvd chills sig fever.   Past Medical History  Diagnosis Date  . Palpitations IRREGULAR HEARTBEAT--  CONTROLLED W/  BETA BLOCKER  . Hypercholesterolemia   . GERD (gastroesophageal reflux disease)   . Diverticulosis of colon     sigmoid  . H/O hiatal hernia   . Arthritis KNEES  . Acute meniscal tear of knee LEFT KNEE  . Left knee pain   . Swelling of left knee joint   . Hypertension     t. brackbill  . 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 Heart attack Father   . Heart failure Sister   . Colon cancer Brother     died/age 29  . Lung cancer Sister   . Brain cancer Sister   . Cancer Brother     mouth  . Leukemia Brother     History   Social History  . Marital Status: Married    Spouse Name: N/A    Number of Children: 2  . Years of Education: N/A   Occupational History  . Retired    Social History Main Topics  . Smoking status: Never Smoker   . Smokeless tobacco: Never Used  . Alcohol Use: 0.0 oz/week     Comment: occ  . Drug Use: No  . Sexual Activity: None   Other Topics Concern  . None   Social History Narrative  . None    Outpatient Encounter Prescriptions as of 09/04/2013  Medication Sig  . aspirin 81 MG tablet Take 81 mg by mouth 2 (two) times daily.  . bifidobacterium infantis (ALIGN) capsule Take 1 capsule by mouth every morning.   .  Calcium-Magnesium-Vitamin D (CALCIUM 1200+D3 PO) Take 1 tablet by mouth every morning.  . celecoxib (CELEBREX) 200 MG capsule Take 200 mg by mouth every morning.   . Cholecalciferol (VITAMIN D) 2000 UNITS tablet Take 2,000 Units by mouth at bedtime.  Marland Kitchen co-enzyme Q-10 30 MG capsule Take 30 mg by mouth 2 (two) times daily.   Marland Kitchen ezetimibe (ZETIA) 10 MG tablet Take 10 mg by mouth every evening.  . fish oil-omega-3 fatty acids 1000 MG capsule Take 1 g by mouth 2 (two) times daily.  . metoprolol (LOPRESSOR) 50 MG tablet Take 0.5 tablets (25 mg total) by mouth 2 (two) times daily.  . Multiple Vitamins-Minerals (CENTRUM SILVER PO) Take 1 tablet by mouth every morning.   Marland Kitchen omeprazole (PRILOSEC) 40 MG capsule TAKE ONE CAPSULE BY MOUTH EVERY DAY  . traMADol (ULTRAM) 50 MG tablet Take 1 tablet (50 mg total) by mouth every 6 (six) hours as needed for pain. Maximum dose= 8 tablets per day  . ciprofloxacin (CIPRO) 250 MG tablet Take 1 tablet (250  mg total) by mouth 2 (two) times daily.  . [DISCONTINUED] omeprazole (PRILOSEC) 40 MG capsule Take 40 mg by mouth every morning.   . [DISCONTINUED] sulfamethoxazole-trimethoprim (SEPTRA DS) 800-160 MG per tablet Take 1 tablet by mouth 2 (two) times daily.    EXAM:  BP 122/82  Pulse 91  Temp(Src) 99.3 F (37.4 C) (Oral)  Ht 5' 2.5" (1.588 m)  Wt 147 lb 1.3 oz (66.715 kg)  BMI 26.46 kg/m2  SpO2 97%  Body mass index is 26.46 kg/(m^2).  GENERAL: vitals reviewed and listed above, alert, oriented, appears well hydrated and in no acute distress non toxic  HEENT: atraumatic, conjunctiva  clear, no obvious abnormalities on inspection of external nose and ears  NECK: no obvious masses on inspection palpation  Abdomen:  Sof,t normal bowel sounds without hepatosplenomegaly, no guarding rebound or masses no CVA tenderness. MS: moves all extremities  PSYCH: pleasant and cooperative, no obvious depression or anxiety ua unable to interpret .  Last UCX e coli r to amox and  pcn ASSESSMENT AND PLAN:  Discussed the following assessment and plan:  UTI (urinary tract infection) - prob recurrent vs relapsing   Urine frequency - Plan: CANCELED: POCT urinalysis dipstick, CANCELED: POCT urinalysis dipstick  -Patient advised to return or notify health care team  if symptoms worsen or persist or new concerns arise.  Patient Instructions  This acts like another uti  Treat with different antibiotic.   And if not all better  Then fu with PCP     Standley Brooking. Amarianna Abplanalp M.D.

## 2013-09-04 NOTE — Patient Instructions (Signed)
This acts like another uti  Treat with different antibiotic.   And if not all better  Then fu with PCP

## 2013-09-07 ENCOUNTER — Telehealth: Payer: Self-pay | Admitting: *Deleted

## 2013-09-07 NOTE — Telephone Encounter (Signed)
[facility] Triage Call Report Triage Record Num: [id number] Operator: Liana Gerold Patient Name: Leslie Bryant Call Date & Time: 09/04/2013 8:31:43AM Patient Phone: [phone] PCP: [name] Patient Gender: Female PCP Fax : [phone] Patient DOB: 04-25-1941 Practice Name[facility] Reason for Call: Caller: [name]/Patient; PCP: [name] (Adults only); CB#: [phone]; Call regarding Urinary Pain/Bleeding; Onset 09/03/13 Pt states she has pain with urination and low back pain. Afebrile. All emergent symptoms ruled out per Urinary Symptoms-Female protocol with exception "Urinary tract symptoms and any flank or low back pain." Home care advice given. Per disp see provider within 24 hiours appt scheduled for 1045 with Dr Regis Bill. Protocol(s) Used: Urinary Symptoms - Female Recommended Outcome per Protocol: See Provider within 4 hours Reason for Outcome: Urinary tract symptoms AND any flank or low back pain Care Advice: ~ Call provider if symptoms worsen, such as increasing pain in low back, pelvis, or side(s); blood in urine; or fever. 11/

## 2013-09-16 ENCOUNTER — Other Ambulatory Visit: Payer: Self-pay

## 2013-09-16 DIAGNOSIS — Z1231 Encounter for screening mammogram for malignant neoplasm of breast: Secondary | ICD-10-CM

## 2013-09-27 ENCOUNTER — Ambulatory Visit: Payer: Medicare Other | Admitting: Cardiology

## 2013-10-15 ENCOUNTER — Ambulatory Visit: Payer: Medicare Other | Admitting: Cardiology

## 2013-10-20 ENCOUNTER — Ambulatory Visit
Admission: RE | Admit: 2013-10-20 | Discharge: 2013-10-20 | Disposition: A | Discharge status: Home / Self Care | Payer: Medicare Other | Source: Ambulatory Visit

## 2013-10-20 ENCOUNTER — Other Ambulatory Visit: Payer: Self-pay

## 2013-10-20 DIAGNOSIS — Z1231 Encounter for screening mammogram for malignant neoplasm of breast: Secondary | ICD-10-CM

## 2013-10-27 ENCOUNTER — Other Ambulatory Visit: Payer: Self-pay | Admitting: Internal Medicine

## 2013-10-27 DIAGNOSIS — R928 Other abnormal and inconclusive findings on diagnostic imaging of breast: Secondary | ICD-10-CM

## 2013-11-08 ENCOUNTER — Other Ambulatory Visit: Payer: Self-pay

## 2013-11-08 MED ORDER — EZETIMIBE 10 MG PO TABS: 10.0000 mg | ORAL_TABLET | Freq: Every evening | ORAL | Status: DC

## 2013-11-10 ENCOUNTER — Ambulatory Visit
Admission: RE | Admit: 2013-11-10 | Discharge: 2013-11-10 | Disposition: A | Discharge status: Home / Self Care | Payer: Medicare Other | Source: Ambulatory Visit | Attending: Internal Medicine | Admitting: Internal Medicine

## 2013-11-10 DIAGNOSIS — R928 Other abnormal and inconclusive findings on diagnostic imaging of breast: Secondary | ICD-10-CM

## 2013-11-11 ENCOUNTER — Other Ambulatory Visit: Payer: Self-pay

## 2013-11-11 MED ORDER — EZETIMIBE 10 MG PO TABS: 10.0000 mg | ORAL_TABLET | Freq: Every evening | ORAL | Status: DC

## 2013-11-15 ENCOUNTER — Other Ambulatory Visit: Payer: Self-pay | Admitting: *Deleted

## 2013-11-15 MED ORDER — CELECOXIB 200 MG PO CAPS: 200.0000 mg | ORAL_CAPSULE | Freq: Every morning | ORAL | Status: DC

## 2013-11-15 NOTE — Telephone Encounter (Signed)
Pt phoned requesting refill on celebrex. Last OV with PCP was 08/10/2013.  Refill request sent to pharmacy per refill protocol.  Pt aware.

## 2013-12-01 ENCOUNTER — Encounter: Payer: Self-pay | Admitting: Cardiology

## 2013-12-01 ENCOUNTER — Ambulatory Visit (INDEPENDENT_AMBULATORY_CARE_PROVIDER_SITE_OTHER): Payer: Medicare Other | Admitting: Cardiology

## 2013-12-01 VITALS — BP 110/63 | HR 82 | Ht 62.5 in | Wt 148.0 lb

## 2013-12-01 DIAGNOSIS — I471 Supraventricular tachycardia: Secondary | ICD-10-CM

## 2013-12-01 DIAGNOSIS — I498 Other specified cardiac arrhythmias: Secondary | ICD-10-CM

## 2013-12-01 DIAGNOSIS — K219 Gastro-esophageal reflux disease without esophagitis: Secondary | ICD-10-CM

## 2013-12-01 DIAGNOSIS — I10 Essential (primary) hypertension: Secondary | ICD-10-CM

## 2013-12-01 DIAGNOSIS — E785 Hyperlipidemia, unspecified: Secondary | ICD-10-CM

## 2013-12-01 DIAGNOSIS — E7800 Pure hypercholesterolemia, unspecified: Secondary | ICD-10-CM

## 2013-12-01 LAB — HEPATIC FUNCTION PANEL
ALBUMIN: 4 g/dL (ref 3.5–5.2)
ALT: 26 U/L (ref 0–35)
AST: 20 U/L (ref 0–37)
Alkaline Phosphatase: 63 U/L (ref 39–117)
Bilirubin, Direct: 0 mg/dL (ref 0.0–0.3)
TOTAL PROTEIN: 7.6 g/dL (ref 6.0–8.3)
Total Bilirubin: 0.6 mg/dL (ref 0.3–1.2)

## 2013-12-01 LAB — BASIC METABOLIC PANEL
BUN: 18 mg/dL (ref 6–23)
CALCIUM: 9.7 mg/dL (ref 8.4–10.5)
CO2: 27 mEq/L (ref 19–32)
Chloride: 99 mEq/L (ref 96–112)
Creatinine, Ser: 0.8 mg/dL (ref 0.4–1.2)
GFR: 73.59 mL/min (ref >60.00)
Glucose, Bld: 103 mg/dL — ABNORMAL HIGH (ref 70–99)
Potassium: 5 mEq/L (ref 3.5–5.1)
SODIUM: 132 meq/L — AB (ref 135–145)

## 2013-12-01 LAB — LIPID PANEL
Cholesterol: 165 mg/dL (ref 0–200)
HDL: 56.8 mg/dL (ref >39.00)
LDL Cholesterol: 90 mg/dL (ref 0–99)
TRIGLYCERIDES: 89 mg/dL (ref 0.0–149.0)
Total CHOL/HDL Ratio: 3
VLDL: 17.8 mg/dL (ref 0.0–40.0)

## 2013-12-01 NOTE — Assessment & Plan Note (Signed)
She has not had recurrent tachycardia or palpitations

## 2013-12-01 NOTE — Progress Notes (Signed)
Quick Note:  Please report to patient. The recent labs are stable. Continue same medication and careful diet. ______ 

## 2013-12-01 NOTE — Patient Instructions (Signed)
Will obtain labs today and call you with the results (lp/bmet/hfp)  Your physician recommends that you continue on your current medications as directed. Please refer to the Current Medication list given to you today.  Your physician wants you to follow-up in: 6 months with fasting labs (lp/bmet/hfp)  You will receive a reminder letter in the mail two months in advance. If you don't receive a letter, please call our office to schedule the follow-up appointment.  

## 2013-12-01 NOTE — Progress Notes (Signed)
[place] Date of Birth:  [birth year] [address] [place], [place]  [address] [phone]  Fax   [phone]  HPI: This pleasant 73 year old woman is seen for a scheduled office visit. She has a past history of high cholesterol and a history of palpitations. She does not have any history of ischemic heart disease. 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 She has normal left ventricular function. She was in good health until December 2012 when she developed chills fever and abdominal pain and was hospitalized with acute diverticulitis with mini perforation and developed a abscess which had to be drained by Dr. Hassell Done. The patient had not been aware that she had diverticulosis until she had the episode of severe diverticulitis. Since we last saw her she has been doing well.  Since we last saw her she had left total knee replacement on 07/21/13 by Dr. Sharol Given.  Current Outpatient Prescriptions  Medication Sig Dispense Refill  . aspirin 81 MG tablet Take 81 mg by mouth 2 (two) times daily.      . bifidobacterium infantis (ALIGN) capsule Take 1 capsule by mouth every morning.       . Calcium-Magnesium-Vitamin D (CALCIUM 1200+D3 PO) Take 1 tablet by mouth every morning.      . celecoxib (CELEBREX) 200 MG capsule Take 1 capsule (200 mg total) by mouth every morning.  30 capsule  3  . Cholecalciferol (VITAMIN D) 2000 UNITS tablet Take 2,000 Units by mouth at bedtime.      Marland Kitchen co-enzyme Q-10 30 MG capsule Take 30 mg by mouth 2 (two) times daily.       Marland Kitchen ezetimibe (ZETIA) 10 MG tablet Take 1 tablet (10 mg total) by mouth every evening.  30 tablet  0  . fish oil-omega-3 fatty acids 1000 MG capsule Take 1 g by mouth 2 (two) times daily.      . metoprolol (LOPRESSOR) 50 MG tablet Take 0.5 tablets (25  mg total) by mouth 2 (two) times daily.  90 tablet  3  . Multiple Vitamins-Minerals (CENTRUM SILVER PO) Take 1 tablet by mouth every morning.       Marland Kitchen omeprazole (PRILOSEC) 40 MG capsule TAKE ONE CAPSULE BY MOUTH EVERY DAY  270 capsule  0  . traMADol (ULTRAM) 50 MG tablet Take 1 tablet (50 mg total) by mouth every 6 (six) hours as needed for pain. Maximum dose= 8 tablets per day  60 tablet  0   No current facility-administered medications for this visit.    Allergies  Allergen Reactions  . Crestor [Rosuvastatin Calcium] Other (See Comments)    Shoulder pain  . Lipitor [Atorvastatin Calcium] Other (See Comments)    Myalgias   . Pravastatin Other (See Comments)    myalgias  . Penicillins Itching    Patient Active Problem List   Diagnosis Date Noted  . Palpitations     Priority: High  . UTI (urinary tract infection) 09/04/2013  . Unspecified deficiency anemia 06/09/2013  . Anemia, unspecified 05/06/2013  . Lower back pain 02/01/2013  . Dysuria 09/15/2012  . Osteoarthritis of left knee 09/08/2012  . Complex tear of medial meniscus of left knee as current injury 09/08/2012  . Abdominal pain, lower 07/23/2012  . Preventative health care 05/05/2012  . Family hx of colon cancer 05/05/2012  . Diverticulitis of large  intestine with perforation 11/02/2011  . Malaise and fatigue 08/14/2011  . History of breast cancer 02/06/2011  . Supraventricular tachycardia   . EUSTACHIAN TUBE DYSFUNCTION, LEFT 04/11/2010  . ANXIETY 05/05/2008  . DEPRESSION 05/05/2008  . HYPERTENSION 05/05/2008  . ALLERGIC RHINITIS 05/05/2008  . DIVERTICULOSIS, COLON 05/05/2008  . IBS 05/05/2008  . OSTEOPENIA 05/05/2008  . COLONIC POLYPS, HX OF 05/05/2008  . HYPERLIPIDEMIA 05/29/2007  . GERD 05/29/2007    History  Smoking status  . Never Smoker   Smokeless tobacco  . Never Used    History  Alcohol Use  . 0.0 oz/week    Comment: occ    Family History  Problem Relation Age of Onset  . Heart attack  Father   . Heart failure Sister   . Colon cancer Brother     died/age 24  . Lung cancer Sister   . Brain cancer Sister   . Cancer Brother     mouth  . Leukemia Brother     Review of Systems: The patient denies any heat or cold intolerance.  No weight gain or weight loss.  The patient denies headaches or blurry vision.  There is no cough or sputum production.  The patient denies dizziness.  There is no hematuria or hematochezia.  The patient denies any muscle aches or arthritis.  The patient denies any rash.  The patient denies frequent falling or instability.  There is no history of depression or anxiety.  All other systems were reviewed and are negative.   Physical Exam: Filed Vitals:   12/01/13 0918  BP: 110/63  Pulse: 82   the general appearance reveals a well-developed well-nourished woman in no distress.  She is having moderate back pain today which accounts for her slightly elevated blood pressure.The head and neck exam reveals pupils equal and reactive.  Extraocular movements are full.  There is no scleral icterus.  The mouth and pharynx are normal.  The neck is supple.  The carotids reveal no bruits.  The jugular venous pressure is normal.  The  thyroid is not enlarged.  There is no lymphadenopathy.  The chest is clear to percussion and auscultation.  There are no rales or rhonchi.  Expansion of the chest is symmetrical.  The precordium is quiet.  The first heart sound is normal.  The second heart sound is physiologically split.  There is no murmur gallop rub or click.  There is no abnormal lift or heave.  The abdomen is soft and nontender.  The bowel sounds are normal.  The liver and spleen are not enlarged.  There are no abdominal masses.  There are no abdominal bruits.  Extremities reveal good pedal pulses.  There is no phlebitis or edema.  There is no cyanosis or clubbing.  Strength is normal and symmetrical in all extremities.  There is no lateralizing weakness.  There are no sensory  deficits.  The skin is warm and dry.  There is no rash.      Assessment / Plan: Continue same medication.  Blood work today pending.  Recheck in 6 months for followup office visit  lipid panel hepatic function panel and basal metabolic panel.  She had an EKG on 07/24/13 which was normal with normal sinus rhythm and occasional PVC.

## 2013-12-01 NOTE — Assessment & Plan Note (Signed)
Patient has a history of hypercholesterolemia and is on ezetimibe.  Blood work today is pending.  She has not had any side effects from her medications

## 2013-12-01 NOTE — Assessment & Plan Note (Signed)
Blood pressure has been staying stable on current therapy.  No headaches dizziness or syncope

## 2013-12-08 ENCOUNTER — Other Ambulatory Visit: Payer: Self-pay | Admitting: Obstetrics and Gynecology

## 2013-12-14 ENCOUNTER — Telehealth: Payer: Self-pay

## 2013-12-14 MED ORDER — CELECOXIB 200 MG PO CAPS: 200.0000 mg | ORAL_CAPSULE | Freq: Every morning | ORAL | Status: DC

## 2013-12-14 NOTE — Telephone Encounter (Signed)
Mail order refill.

## 2014-01-20 ENCOUNTER — Telehealth: Payer: Self-pay | Admitting: Cardiology

## 2014-01-20 NOTE — Telephone Encounter (Signed)
New message     Talk to Leslie Bryant-----pt would not tell me what she wanted

## 2014-01-20 NOTE — Telephone Encounter (Signed)
Patient concerned Zetia causing her ankles, feet, and sometimes legs to hurt. Advised ok to hold Zetia for 2 weeks and call back with update.

## 2014-01-20 NOTE — Telephone Encounter (Signed)
Agree with advice given.  Ezetimibe can sometimes cause arthralgias and myalgias similar to statins

## 2014-02-24 ENCOUNTER — Other Ambulatory Visit: Payer: Self-pay

## 2014-02-24 DIAGNOSIS — I10 Essential (primary) hypertension: Secondary | ICD-10-CM

## 2014-02-24 DIAGNOSIS — R002 Palpitations: Secondary | ICD-10-CM

## 2014-02-24 MED ORDER — METOPROLOL TARTRATE 50 MG PO TABS: 25.0000 mg | ORAL_TABLET | Freq: Two times a day (BID) | ORAL | Status: DC

## 2014-02-24 MED ORDER — OMEPRAZOLE 40 MG PO CPDR: DELAYED_RELEASE_CAPSULE | ORAL | Status: DC

## 2014-02-24 MED ORDER — EZETIMIBE 10 MG PO TABS: 10.0000 mg | ORAL_TABLET | Freq: Every evening | ORAL | Status: DC

## 2014-04-13 ENCOUNTER — Ambulatory Visit (INDEPENDENT_AMBULATORY_CARE_PROVIDER_SITE_OTHER): Payer: Medicare Other | Admitting: Internal Medicine

## 2014-04-13 ENCOUNTER — Encounter: Payer: Self-pay | Admitting: Internal Medicine

## 2014-04-13 VITALS — BP 112/64 | HR 101 | Temp 98.7°F | Wt 153.0 lb

## 2014-04-13 DIAGNOSIS — I10 Essential (primary) hypertension: Secondary | ICD-10-CM

## 2014-04-13 DIAGNOSIS — J019 Acute sinusitis, unspecified: Secondary | ICD-10-CM

## 2014-04-13 DIAGNOSIS — J309 Allergic rhinitis, unspecified: Secondary | ICD-10-CM

## 2014-04-13 MED ORDER — HYDROCODONE-HOMATROPINE 5-1.5 MG/5ML PO SYRP: 5.0000 mL | ORAL_SOLUTION | Freq: Four times a day (QID) | ORAL | Status: DC | PRN

## 2014-04-13 MED ORDER — LEVOFLOXACIN 250 MG PO TABS: 250.0000 mg | ORAL_TABLET | Freq: Every day | ORAL | Status: DC

## 2014-04-13 NOTE — Assessment & Plan Note (Signed)
For otc allegra prn,  to f/u any worsening symptoms or concerns

## 2014-04-13 NOTE — Patient Instructions (Signed)
Please take all new medication as prescribed  Please continue all other medications as before, and refills have been done if requested.  Please have the pharmacy call with any other refills you may need.  You can also take Delsym OTC for cough, and/or Mucinex (or it's generic off brand) for congestion, and tylenol as needed for pain.

## 2014-04-13 NOTE — Assessment & Plan Note (Signed)
stable overall by history and exam, recent data reviewed with pt, and pt to continue medical treatment as before,  to f/u any worsening symptoms or concerns BP Readings from Last 3 Encounters:  04/13/14 112/64  12/01/13 110/63  09/04/13 122/82

## 2014-04-13 NOTE — Progress Notes (Signed)
Pre visit review using our clinic review tool, if applicable. No additional management support is needed unless otherwise documented below in the visit note. 

## 2014-04-13 NOTE — Assessment & Plan Note (Signed)
Mild to mod, for antibx course,  to f/u any worsening symptoms or concerns 

## 2014-04-13 NOTE — Progress Notes (Signed)
Subjective:    Patient ID: Leslie Bryant, female    DOB: [birth year], 73 y.o.   MRN: [id number]  HPI   Here with 2-3 days acute onset fever, facial pain, pressure, headache, general weakness and malaise, and greenish d/c, with mild ST and cough, but pt denies chest pain, wheezing, increased sob or doe, orthopnea, PND, increased LE swelling, palpitations, dizziness or syncope.  Does have several wks ongoing nasal allergy symptoms with clearish congestion, itch and sneezing, without fever, pain, ST, cough, swelling or wheezing.  Pt denies new neurological symptoms such as new headache, or facial or extremity weakness or numbness Past Medical History  Diagnosis Date  . Palpitations IRREGULAR HEARTBEAT--  CONTROLLED W/  BETA BLOCKER  . Hypercholesterolemia   . GERD (gastroesophageal reflux disease)   . Diverticulosis of colon     sigmoid  . H/O hiatal hernia   . Arthritis KNEES  . Acute meniscal tear of knee LEFT KNEE  . Left knee pain   . Swelling of left knee joint   . Hypertension     t. brackbill  . 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 Cardiac catheterization  12-22-2000  DR BRACKBILL    NORMAL LVF/ NORMAL CORONARY ARTERIES  . Vaginal hysterectomy  1966  . Breast recontruction w/ implants  1987  . Mastectomy  1986    RIGHT BREAST W/ MULTIPLE NODE DISSECTION  . Cholecystectomy  1988  . Knee arthroscopy  2011    RIGHT KNEE  . Knee arthroscopy with medial menisectomy  09/08/2012    Procedure: KNEE ARTHROSCOPY WITH MEDIAL MENISECTOMY;  Surgeon: Tobi Bastos, MD;  Location: [place];  Service: Orthopedics;  Laterality: Left;  lateral tibial plateau,   . Chondroplasty  09/08/2012    Procedure: CHONDROPLASTY;  Surgeon: Tobi Bastos, MD;  Location: [facility];  Service: Orthopedics;  Laterality: Left;  . Total knee arthroplasty Left 07/21/2013    Procedure: TOTAL KNEE ARTHROPLASTY;  Surgeon: Newt Minion, MD;  Location: [place];  Service: Orthopedics;  Laterality: Left;  Left Total Knee Arthroplasty    reports that she has never smoked. She has never used smokeless tobacco. She reports that she drinks alcohol. She reports that she does not use illicit drugs. family history includes Brain cancer in her sister; Cancer in her brother; Colon cancer in her brother; Heart attack in her father; Heart failure in her sister; Leukemia in her brother; Lung cancer in her sister. Allergies  Allergen Reactions  . Crestor [Rosuvastatin Calcium] Other (See Comments)    Shoulder pain  . Lipitor [Atorvastatin Calcium] Other (See Comments)    Myalgias   . Pravastatin Other (See Comments)    myalgias  . Penicillins Itching   Current Outpatient Prescriptions on File Prior to Visit  Medication Sig Dispense Refill  . aspirin 81 MG tablet Take 81 mg by mouth 2 (two) times daily.      . bifidobacterium infantis (ALIGN) capsule Take 1 capsule by mouth every morning.       . Calcium-Magnesium-Vitamin D (CALCIUM 1200+D3 PO) Take 1 tablet by mouth every morning.      . celecoxib (CELEBREX) 200 MG capsule Take 1 capsule (200 mg total) by mouth every morning.  90 capsule  3  . Cholecalciferol (VITAMIN D) 2000 UNITS tablet Take 2,000 Units by mouth at bedtime.      Marland Kitchen co-enzyme Q-10  30 MG capsule Take 30 mg by mouth 2 (two) times daily.       Marland Kitchen ezetimibe (ZETIA) 10 MG tablet Take 1 tablet (10 mg total) by mouth every evening.  90 tablet  1  . fish oil-omega-3 fatty acids 1000 MG capsule Take 1 g by mouth 2 (two) times daily.      . metoprolol (LOPRESSOR) 50 MG tablet Take 0.5 tablets (25 mg total) by mouth 2 (two) times daily.  90 tablet  1  . Multiple Vitamins-Minerals (CENTRUM SILVER PO) Take 1 tablet by mouth every morning.       Marland Kitchen omeprazole (PRILOSEC) 40 MG capsule TAKE ONE CAPSULE BY MOUTH EVERY DAY  270 capsule  0  . traMADol (ULTRAM) 50 MG tablet Take 1 tablet (50 mg total) by mouth every 6 (six) hours as  needed for pain. Maximum dose= 8 tablets per day  60 tablet  0   No current facility-administered medications on file prior to visit.   Review of Systems  Constitutional: Negative for unusual diaphoresis or other sweats  HENT: Negative for ringing in ear Eyes: Negative for double vision or worsening visual disturbance.  Respiratory: Negative for choking and stridor.   Gastrointestinal: Negative for vomiting or other signifcant bowel change Genitourinary: Negative for hematuria or decreased urine volume.  Musculoskeletal: Negative for other MSK pain or swelling Skin: Negative for color change and worsening wound.  Neurological: Negative for tremors and numbness other than noted  Psychiatric/Behavioral: Negative for decreased concentration or agitation other than above       Objective:   Physical Exam BP 112/64  Pulse 101  Temp(Src) 98.7 F (37.1 C) (Oral)  Wt 153 lb (69.4 kg)  SpO2 95% VS noted, mild ill Constitutional: Pt appears well-developed, well-nourished.  HENT: Head: NCAT.  Right Ear: External ear normal.  Left Ear: External ear normal.  Bilat tm's with mild erythema.  Max sinus areas mild tender.  Pharynx with mild erythema, no exudate Eyes: . Pupils are equal, round, and reactive to light. Conjunctivae and EOM are normal Neck: Normal range of motion. Neck supple.  Cardiovascular: Normal rate and regular rhythm.   Pulmonary/Chest: Effort normal and breath sounds normal.  Abd:  Soft, NT, ND, + BS Neurological: Pt is alert. Not confused , motor grossly intact Skin: Skin is warm. No rash Psychiatric: Pt behavior is normal. No agitation.      Assessment & Plan:

## 2014-06-08 ENCOUNTER — Encounter: Payer: Self-pay | Admitting: Internal Medicine

## 2014-06-09 ENCOUNTER — Encounter: Payer: Self-pay | Admitting: Internal Medicine

## 2014-07-06 ENCOUNTER — Other Ambulatory Visit: Payer: Medicare Other

## 2014-07-06 ENCOUNTER — Ambulatory Visit: Payer: Medicare Other | Admitting: Cardiology

## 2014-07-13 ENCOUNTER — Other Ambulatory Visit (INDEPENDENT_AMBULATORY_CARE_PROVIDER_SITE_OTHER): Payer: Medicare Other

## 2014-07-13 ENCOUNTER — Encounter: Payer: Self-pay | Admitting: Internal Medicine

## 2014-07-13 ENCOUNTER — Ambulatory Visit (INDEPENDENT_AMBULATORY_CARE_PROVIDER_SITE_OTHER): Payer: Medicare Other | Admitting: Internal Medicine

## 2014-07-13 VITALS — BP 110/78 | HR 83 | Temp 98.5°F | Wt 154.2 lb

## 2014-07-13 DIAGNOSIS — Z23 Encounter for immunization: Secondary | ICD-10-CM

## 2014-07-13 DIAGNOSIS — I10 Essential (primary) hypertension: Secondary | ICD-10-CM

## 2014-07-13 DIAGNOSIS — Z0000 Encounter for general adult medical examination without abnormal findings: Secondary | ICD-10-CM

## 2014-07-13 DIAGNOSIS — M545 Low back pain, unspecified: Secondary | ICD-10-CM

## 2014-07-13 DIAGNOSIS — K589 Irritable bowel syndrome without diarrhea: Secondary | ICD-10-CM

## 2014-07-13 LAB — BASIC METABOLIC PANEL
BUN: 17 mg/dL (ref 6–23)
CO2: 28 mEq/L (ref 19–32)
Calcium: 9.6 mg/dL (ref 8.4–10.5)
Chloride: 100 mEq/L (ref 96–112)
Creatinine, Ser: 1 mg/dL (ref 0.4–1.2)
GFR: 56.3 mL/min — AB (ref >60.00)
Glucose, Bld: 83 mg/dL (ref 70–99)
POTASSIUM: 4.7 meq/L (ref 3.5–5.1)
Sodium: 134 mEq/L — ABNORMAL LOW (ref 135–145)

## 2014-07-13 LAB — CBC WITH DIFFERENTIAL/PLATELET
BASOS PCT: 0.6 % (ref 0.0–3.0)
Basophils Absolute: 0 10*3/uL (ref 0.0–0.1)
EOS PCT: 3.2 % (ref 0.0–5.0)
Eosinophils Absolute: 0.2 10*3/uL (ref 0.0–0.7)
HEMATOCRIT: 36.3 % (ref 36.0–46.0)
Hemoglobin: 12.3 g/dL (ref 12.0–15.0)
LYMPHS ABS: 2.5 10*3/uL (ref 0.7–4.0)
Lymphocytes Relative: 35 % (ref 12.0–46.0)
MCHC: 33.8 g/dL (ref 30.0–36.0)
MCV: 92 fl (ref 78.0–100.0)
MONO ABS: 0.7 10*3/uL (ref 0.1–1.0)
MONOS PCT: 9.4 % (ref 3.0–12.0)
Neutro Abs: 3.7 10*3/uL (ref 1.4–7.7)
Neutrophils Relative %: 51.8 % (ref 43.0–77.0)
PLATELETS: 299 10*3/uL (ref 150.0–400.0)
RBC: 3.95 Mil/uL (ref 3.87–5.11)
RDW: 12.5 % (ref 11.5–15.5)
WBC: 7.2 10*3/uL (ref 4.0–10.5)

## 2014-07-13 LAB — LIPID PANEL
CHOL/HDL RATIO: 4
Cholesterol: 165 mg/dL (ref 0–200)
HDL: 46.6 mg/dL (ref >39.00)
NonHDL: 118.4
Triglycerides: 218 mg/dL — ABNORMAL HIGH (ref 0.0–149.0)
VLDL: 43.6 mg/dL — ABNORMAL HIGH (ref 0.0–40.0)

## 2014-07-13 LAB — HEPATIC FUNCTION PANEL
ALT: 30 U/L (ref 0–35)
AST: 24 U/L (ref 0–37)
Albumin: 3.7 g/dL (ref 3.5–5.2)
Alkaline Phosphatase: 70 U/L (ref 39–117)
Bilirubin, Direct: 0 mg/dL (ref 0.0–0.3)
Total Bilirubin: 0.3 mg/dL (ref 0.2–1.2)
Total Protein: 7.6 g/dL (ref 6.0–8.3)

## 2014-07-13 LAB — TSH: TSH: 1.18 u[IU]/mL (ref 0.35–4.50)

## 2014-07-13 LAB — URINALYSIS, ROUTINE W REFLEX MICROSCOPIC
BILIRUBIN URINE: NEGATIVE
Hgb urine dipstick: NEGATIVE
Ketones, ur: NEGATIVE
LEUKOCYTES UA: NEGATIVE
Nitrite: NEGATIVE
PH: 6 (ref 5.0–8.0)
RBC / HPF: NONE SEEN (ref 0–?)
TOTAL PROTEIN, URINE-UPE24: NEGATIVE
Urine Glucose: NEGATIVE
Urobilinogen, UA: 0.2 (ref 0.0–1.0)

## 2014-07-13 LAB — LDL CHOLESTEROL, DIRECT: LDL DIRECT: 111.9 mg/dL

## 2014-07-13 MED ORDER — PREDNISONE 10 MG PO TABS: ORAL_TABLET | ORAL | Status: DC

## 2014-07-13 MED ORDER — TRAMADOL HCL 50 MG PO TABS: 50.0000 mg | ORAL_TABLET | Freq: Four times a day (QID) | ORAL | Status: DC | PRN

## 2014-07-13 MED ORDER — TIZANIDINE HCL 4 MG PO TABS: 4.0000 mg | ORAL_TABLET | Freq: Four times a day (QID) | ORAL | Status: DC | PRN

## 2014-07-13 NOTE — Progress Notes (Signed)
Subjective:    Patient ID: Leslie Bryant, female    DOB: Nov 12, 1940, 73 y.o.   MRN: [id number]  HPI  Here with 2-3 wks onset mod lower back pain, mostly midline but also across the lower back, dull achy type pain, steady and constant, standing for a couple of minutes seems to make worse, did see chiropracter for an adjustment but did not help. Tried ICE, did numb it for a while, but not sure if ow/ helped.  No fever, wt loss,  worsening LE pain/numbness/weakness, gait change or falls.  Has had some recurrent loose stools for 2 wks , not sure if related, yesterday better, today none, ? Improving overall.   Was wondering about cystitis, but pyridium seemed to help. Denies urinary symptoms such as dysuria, frequency, urgency, flank pain, hematuria or n/v, fever, chills.  CT abd 2013 with mild L5-s1 DJD noted. S/p left knee TKR, not really addressed the Lower back. For flu shot today Past Medical History  Diagnosis Date  . Palpitations IRREGULAR HEARTBEAT--  CONTROLLED W/  BETA BLOCKER  . Hypercholesterolemia   . GERD (gastroesophageal reflux disease)   . Diverticulosis of colon     sigmoid  . H/O hiatal hernia   . Arthritis KNEES  . Acute meniscal tear of knee LEFT KNEE  . Left knee pain   . Swelling of left knee joint   . Hypertension     t. brackbill  . 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 Cardiac catheterization  12-22-2000  DR BRACKBILL    NORMAL LVF/ NORMAL CORONARY ARTERIES  . Vaginal hysterectomy  1966  . Breast recontruction w/ implants  1987  . Mastectomy  1986    RIGHT BREAST W/ MULTIPLE NODE DISSECTION  . Cholecystectomy  1988  . Knee arthroscopy  2011    RIGHT KNEE  . Knee arthroscopy with medial menisectomy  09/08/2012    Procedure: KNEE ARTHROSCOPY WITH MEDIAL MENISECTOMY;  Surgeon: Tobi Bastos, MD;  Location: [place];  Service: Orthopedics;  Laterality: Left;  lateral tibial plateau,   .  Chondroplasty  09/08/2012    Procedure: CHONDROPLASTY;  Surgeon: Tobi Bastos, MD;  Location: [facility];  Service: Orthopedics;  Laterality: Left;  . Total knee arthroplasty Left 07/21/2013    Procedure: TOTAL KNEE ARTHROPLASTY;  Surgeon: Newt Minion, MD;  Location: [place];  Service: Orthopedics;  Laterality: Left;  Left Total Knee Arthroplasty    reports that she has never smoked. She has never used smokeless tobacco. She reports that she drinks alcohol. She reports that she does not use illicit drugs. family history includes Brain cancer in her sister; Cancer in her brother; Colon cancer in her brother; Heart attack in her father; Heart failure in her sister; Leukemia in her brother; Lung cancer in her sister. Allergies  Allergen Reactions  . Crestor [Rosuvastatin Calcium] Other (See Comments)    Shoulder pain  . Lipitor [Atorvastatin Calcium] Other (See Comments)    Myalgias   . Pravastatin Other (See Comments)    myalgias  . Penicillins Itching   Current Outpatient Prescriptions on File Prior to Visit  Medication Sig Dispense Refill  . aspirin 81 MG tablet Take 81 mg by mouth 2 (two) times daily.      . bifidobacterium infantis (ALIGN) capsule Take 1 capsule by mouth every morning.       . Calcium-Magnesium-Vitamin D (  CALCIUM 1200+D3 PO) Take 1 tablet by mouth every morning.      . celecoxib (CELEBREX) 200 MG capsule Take 1 capsule (200 mg total) by mouth every morning.  90 capsule  3  . Cholecalciferol (VITAMIN D) 2000 UNITS tablet Take 2,000 Units by mouth at bedtime.      Marland Kitchen co-enzyme Q-10 30 MG capsule Take 30 mg by mouth 2 (two) times daily.       Marland Kitchen ezetimibe (ZETIA) 10 MG tablet Take 1 tablet (10 mg total) by mouth every evening.  90 tablet  1  . fish oil-omega-3 fatty acids 1000 MG capsule Take 1 g by mouth 2 (two) times daily.      . metoprolol (LOPRESSOR) 50 MG tablet Take 0.5 tablets (25 mg total) by mouth 2 (two) times daily.  90 tablet  1  . Multiple  Vitamins-Minerals (CENTRUM SILVER PO) Take 1 tablet by mouth every morning.       Marland Kitchen omeprazole (PRILOSEC) 40 MG capsule TAKE ONE CAPSULE BY MOUTH EVERY DAY  270 capsule  0  . traMADol (ULTRAM) 50 MG tablet Take 1 tablet (50 mg total) by mouth every 6 (six) hours as needed for pain. Maximum dose= 8 tablets per day  60 tablet  0   No current facility-administered medications on file prior to visit.    Review of Systems  Constitutional: Negative for unusual diaphoresis or other sweats  HENT: Negative for ringing in ear Eyes: Negative for double vision or worsening visual disturbance.  Respiratory: Negative for choking and stridor.   Gastrointestinal: Negative for vomiting or other signifcant bowel change Genitourinary: Negative for hematuria or decreased urine volume.  Musculoskeletal: Negative for other MSK pain or swelling Skin: Negative for color change and worsening wound.  Neurological: Negative for tremors and numbness other than noted  Psychiatric/Behavioral: Negative for decreased concentration or agitation other than above       Objective:   Physical Exam BP 110/78  Pulse 83  Temp(Src) 98.5 F (36.9 C) (Oral)  Wt 154 lb 4 oz (69.967 kg)  SpO2 94% VS noted,  Constitutional: Pt appears well-developed, well-nourished.  HENT: Head: NCAT.  Right Ear: External ear normal.  Left Ear: External ear normal.  Eyes: . Pupils are equal, round, and reactive to light. Conjunctivae and EOM are normal Neck: Normal range of motion. Neck supple.  Cardiovascular: Normal rate and regular rhythm.   Pulmonary/Chest: Effort normal and breath sounds normal.  Abd:  Soft, NT, ND, + BS Neurological: Pt is alert. Not confused , motor grossly intact, has some muscle quad weakness to LUE, but o/w intact, dtr/sens intact, gait intact Skin: Skin is warm. No rash Psychiatric: Pt behavior is normal. No agitation.      Assessment & Plan:

## 2014-07-13 NOTE — Patient Instructions (Addendum)
Please take all new medication as prescribed - the pain medication, muscle relaxer as needed, and prednisone  Please continue all other medications as before, except you can stop the celebrex if you dont see any benifit  Please have the pharmacy call with any other refills you may need  Please keep your appointments with your specialists as you may have planned  Please go to the LAB in the Basement (turn left off the elevator) for the tests to be done today - just the urine testing today  You will be contacted by phone if any changes need to be made immediately.  Otherwise, you will receive a letter about your results with an explanation, but please check with MyChart first.  Please remember to sign up for MyChart if you have not done so, as this will be important to you in the future with finding out test results, communicating by private email, and scheduling acute appointments online when needed.  Please return in 3 months, or sooner if needed, with Lab testing done 3-5 days before

## 2014-07-13 NOTE — Progress Notes (Signed)
Pre visit review using our clinic review tool, if applicable. No additional management support is needed unless otherwise documented below in the visit note. 

## 2014-07-17 NOTE — Assessment & Plan Note (Signed)
With mild flare, avoid stress, immodium prn,  to f/u any worsening symptoms or concerns

## 2014-07-17 NOTE — Assessment & Plan Note (Signed)
stable overall by history and exam, recent data reviewed with pt, and pt to continue medical treatment as before,  to f/u any worsening symptoms or concerns BP Readings from Last 3 Encounters:  07/13/14 110/78  04/13/14 112/64  12/01/13 110/63  '

## 2014-07-17 NOTE — Assessment & Plan Note (Signed)
No acute neuro change, pain possibly related to underlying lumbar djd/ddd, for pain control, predpac asd, muscle relaxer trial prn,  to f/u any worsening symptoms or concerns

## 2014-07-27 ENCOUNTER — Other Ambulatory Visit: Payer: Self-pay

## 2014-07-27 DIAGNOSIS — R002 Palpitations: Secondary | ICD-10-CM

## 2014-07-27 DIAGNOSIS — I10 Essential (primary) hypertension: Secondary | ICD-10-CM

## 2014-07-27 MED ORDER — METOPROLOL TARTRATE 50 MG PO TABS: 25.0000 mg | ORAL_TABLET | Freq: Two times a day (BID) | ORAL | Status: DC

## 2014-07-27 MED ORDER — EZETIMIBE 10 MG PO TABS
10.0000 mg | ORAL_TABLET | Freq: Every evening | ORAL | Status: DC
Start: 2014-07-27 — End: 2014-07-27

## 2014-07-27 MED ORDER — OMEPRAZOLE 40 MG PO CPDR: DELAYED_RELEASE_CAPSULE | ORAL | Status: DC

## 2014-07-27 MED ORDER — EZETIMIBE 10 MG PO TABS: 10.0000 mg | ORAL_TABLET | Freq: Every evening | ORAL | Status: DC

## 2014-08-03 ENCOUNTER — Other Ambulatory Visit: Payer: Medicare Other

## 2014-08-03 ENCOUNTER — Ambulatory Visit (INDEPENDENT_AMBULATORY_CARE_PROVIDER_SITE_OTHER): Payer: Medicare Other | Admitting: Cardiology

## 2014-08-03 ENCOUNTER — Encounter: Payer: Self-pay | Admitting: Cardiology

## 2014-08-03 VITALS — BP 122/78 | HR 84 | Ht 62.5 in | Wt 153.0 lb

## 2014-08-03 DIAGNOSIS — E7800 Pure hypercholesterolemia, unspecified: Secondary | ICD-10-CM

## 2014-08-03 DIAGNOSIS — E785 Hyperlipidemia, unspecified: Secondary | ICD-10-CM

## 2014-08-03 DIAGNOSIS — I471 Supraventricular tachycardia: Secondary | ICD-10-CM

## 2014-08-03 DIAGNOSIS — K219 Gastro-esophageal reflux disease without esophagitis: Secondary | ICD-10-CM

## 2014-08-03 DIAGNOSIS — R002 Palpitations: Secondary | ICD-10-CM

## 2014-08-03 DIAGNOSIS — I498 Other specified cardiac arrhythmias: Secondary | ICD-10-CM

## 2014-08-03 NOTE — Assessment & Plan Note (Signed)
The patient remains on beta blocker.  She has had no recurrent SVT.  Her blood pressure remains normal.

## 2014-08-03 NOTE — Progress Notes (Signed)
Leslie Bryant Date of Birth:  October 10, 1941 [facility] [address] [place], [place]  [address] [phone]        Fax   [phone]   History of Present Illness: This pleasant 73 year old woman is seen for a scheduled office visit. She has a past history of high cholesterol and a history of palpitations. She does not have any history of ischemic heart disease. 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 She has normal left ventricular function. She was in good health until December 2012 when she developed chills fever and abdominal pain and was hospitalized with acute diverticulitis with mini perforation and developed a abscess which had to be drained by Dr. Hassell Done. The patient had not been aware that she had diverticulosis until she had the episode of severe diverticulitis. Since we last saw her she has been doing well. Since we last saw her she had left total knee replacement on 07/21/13 by Dr. Sharol Given.    Current Outpatient Prescriptions  Medication Sig Dispense Refill  . aspirin 81 MG tablet Take 81 mg by mouth 2 (two) times daily.      . bifidobacterium infantis (ALIGN) capsule Take 1 capsule by mouth every morning.       . Calcium-Magnesium-Vitamin D (CALCIUM 1200+D3 PO) Take 1 tablet by mouth every morning.      . Cholecalciferol (VITAMIN D) 2000 UNITS tablet Take 2,000 Units by mouth at bedtime.      Marland Kitchen co-enzyme Q-10 30 MG capsule Take 30 mg by mouth 2 (two) times daily.       Marland Kitchen ESTRING 2 MG vaginal ring       . ezetimibe (ZETIA) 10 MG tablet Take 1 tablet (10 mg total) by mouth every evening.  90 tablet  1  . fish oil-omega-3 fatty acids 1000 MG capsule Take 1 g by mouth 2 (two) times daily.      . metoprolol (LOPRESSOR) 50 MG tablet Take 0.5 tablets (25 mg total) by mouth 2  (two) times daily.  90 tablet  1  . Multiple Vitamins-Minerals (CENTRUM SILVER PO) Take 1 tablet by mouth every morning.       Marland Kitchen omeprazole (PRILOSEC) 40 MG capsule TAKE ONE CAPSULE BY MOUTH EVERY DAY  90 capsule  1  . traMADol (ULTRAM) 50 MG tablet Take 1 tablet (50 mg total) by mouth every 6 (six) hours as needed. Maximum dose= 8 tablets per day  60 tablet  0   No current facility-administered medications for this visit.    Allergies  Allergen Reactions  . Crestor [Rosuvastatin Calcium] Other (See Comments)    Shoulder pain  . Lipitor [Atorvastatin Calcium] Other (See Comments)    Myalgias   . Pravastatin Other (See Comments)    myalgias  . Penicillins Itching    Patient Active Problem List   Diagnosis Date Noted  . Palpitations     Priority: High  . Unspecified deficiency anemia 06/09/2013  . Anemia, unspecified 05/06/2013  . Lower back pain 02/01/2013  . Osteoarthritis of left knee 09/08/2012  . Complex tear of medial meniscus of left knee as current injury 09/08/2012  . Preventative health care 05/05/2012  . Family hx of colon cancer 05/05/2012  . Diverticulitis of large intestine with perforation 11/02/2011  . Malaise and fatigue 08/14/2011  . History of breast  cancer 02/06/2011  . Supraventricular tachycardia   . EUSTACHIAN TUBE DYSFUNCTION, LEFT 04/11/2010  . ANXIETY 05/05/2008  . DEPRESSION 05/05/2008  . HYPERTENSION 05/05/2008  . ALLERGIC RHINITIS 05/05/2008  . DIVERTICULOSIS, COLON 05/05/2008  . IBS 05/05/2008  . OSTEOPENIA 05/05/2008  . COLONIC POLYPS, HX OF 05/05/2008  . HYPERLIPIDEMIA 05/29/2007  . GERD 05/29/2007    History  Smoking status  . Never Smoker   Smokeless tobacco  . Never Used    History  Alcohol Use  . 0.0 oz/week    Comment: occ    Family History  Problem Relation Age of Onset  . Heart attack Father   . Heart failure Sister   . Colon cancer Brother     died/age 33  . Lung cancer Sister   . Brain cancer Sister   . Cancer  Brother     mouth  . Leukemia Brother     Review of Systems: Constitutional: no fever chills diaphoresis or fatigue or change in weight.  Head and neck: no hearing loss, no epistaxis, no photophobia or visual disturbance. Respiratory: No cough, shortness of breath or wheezing. Cardiovascular: No chest pain peripheral edema, palpitations. Gastrointestinal: No abdominal distention, no abdominal pain, no change in bowel habits hematochezia or melena. Genitourinary: No dysuria, no frequency, no urgency, no nocturia. Musculoskeletal:No arthralgias, no back pain, no gait disturbance or myalgias. Neurological: No dizziness, no headaches, no numbness, no seizures, no syncope, no weakness, no tremors. Hematologic: No lymphadenopathy, no easy bruising. Psychiatric: No confusion, no hallucinations, no sleep disturbance.    Physical Exam: Filed Vitals:   08/03/14 0737  BP: 122/78  Pulse: 84  The patient appears to be in no distress.  Head and neck exam reveals that the pupils are equal and reactive.  The extraocular movements are full.  There is no scleral icterus.  Mouth and pharynx are benign.  No lymphadenopathy.  No carotid bruits.  The jugular venous pressure is normal.  Thyroid is not enlarged or tender.  Chest is clear to percussion and auscultation.  No rales or rhonchi.  Expansion of the chest is symmetrical.  Heart reveals no abnormal lift or heave.  First and second heart sounds are normal.  There is no murmur gallop rub or click.  The abdomen is soft and nontender.  Bowel sounds are normoactive.  There is no hepatosplenomegaly or mass.  There are no abdominal bruits.  Extremities reveal no phlebitis or edema.  Pedal pulses are good.  There is no cyanosis or clubbing.  Neurologic exam is normal strength and no lateralizing weakness.  No sensory deficits.  Integument reveals no rash  EKG shows normal sinus rhythm and is within normal limits.  Assessment / Plan: 1.   Palpitations 2. Hypercholesterolemia 3. Osteoarthritis 4. GERD  Disposition: Continue same medication.  Work harder on weight loss.  She will avoid chocolate and carbohydrates.  Her most recent LDL cholesterol was higher.

## 2014-08-03 NOTE — Patient Instructions (Signed)
Work one weight loss  our physician recommends that you continue on your current medications as directed. Please refer to the Current Medication list given to you today.  Your physician wants you to follow-up in: 6 months with fasting labs (lp/bmet/hfp)  You will receive a reminder letter in the mail two months in advance. If you don't receive a letter, please call our office to schedule the follow-up appointment.

## 2014-08-03 NOTE — Assessment & Plan Note (Signed)
The patient has not been having any symptoms from her GERD.  She is on PPI chronically.

## 2014-08-03 NOTE — Assessment & Plan Note (Signed)
The patient is intolerant of statins.  She is on ezetimibe.  LDL is higher but she will work harder on diet

## 2014-08-04 ENCOUNTER — Ambulatory Visit (AMBULATORY_SURGERY_CENTER): Payer: Self-pay | Admitting: *Deleted

## 2014-08-04 VITALS — Ht 62.5 in | Wt 154.6 lb

## 2014-08-04 DIAGNOSIS — Z86010 Personal history of colonic polyps: Secondary | ICD-10-CM

## 2014-08-04 NOTE — Progress Notes (Signed)
No home 02 use, no cpap. ewm No diet pills. ewm No egg or soy allergy. ewm No problems or issues with past sedation. ewm Pt declined emmi video. ewm

## 2014-08-12 ENCOUNTER — Encounter: Payer: Self-pay | Admitting: Internal Medicine

## 2014-08-18 ENCOUNTER — Ambulatory Visit (AMBULATORY_SURGERY_CENTER): Payer: Medicare Other | Admitting: Internal Medicine

## 2014-08-18 ENCOUNTER — Encounter: Payer: Self-pay | Admitting: Internal Medicine

## 2014-08-18 VITALS — BP 153/82 | HR 75 | Temp 97.9°F | Resp 12

## 2014-08-18 DIAGNOSIS — Z800 Family history of malignant neoplasm of digestive organs: Secondary | ICD-10-CM

## 2014-08-18 DIAGNOSIS — Z86010 Personal history of colonic polyps: Secondary | ICD-10-CM

## 2014-08-18 DIAGNOSIS — D123 Benign neoplasm of transverse colon: Secondary | ICD-10-CM

## 2014-08-18 DIAGNOSIS — K5730 Diverticulosis of large intestine without perforation or abscess without bleeding: Secondary | ICD-10-CM

## 2014-08-18 MED ORDER — SODIUM CHLORIDE 0.9 % IV SOLN: 500.0000 mL | INTRAVENOUS | Status: DC

## 2014-08-18 NOTE — Op Note (Signed)
[address] [place], [id number]   COLONOSCOPY PROCEDURE REPORT  PATIENT: Leslie Bryant, Leslie Bryant  MR#: [id number] BIRTHDATE: 07-16-1941 , 67  yrs. old GENDER: female ENDOSCOPIST: Gatha Mayer, MD, [facility] PROCEDURE DATE:  08/18/2014 PROCEDURE:   Colonoscopy with snare polypectomy First Screening Colonoscopy - Avg.  risk and is 50 yrs.  old or older - No.  Prior Negative Screening - Now for repeat screening. N/A  History of Adenoma - Now for follow-up colonoscopy & has been > or = to 3 yrs.  Yes hx of adenoma.  Has been 3 or more years since last colonoscopy.  Polyps Removed Today? Yes. ASA CLASS:   Class II INDICATIONS:high risk personal history of colonic polyps and patient's immediate family history of colon cancer. MEDICATIONS: Propofol 240 mg IV and Monitored anesthesia care  DESCRIPTION OF PROCEDURE:   After the risks benefits and alternatives of the procedure were thoroughly explained, informed consent was obtained.  The digital rectal exam revealed no abnormalities of the rectum.   The [id number]  endoscope was introduced through the anus and advanced to the cecum, which was identified by both the appendix and ileocecal valve. No adverse events experienced.   The quality of the prep was excellent, using MiraLax  The instrument was then slowly withdrawn as the colon was fully examined.      COLON FINDINGS: A sessile polyp measuring 3 mm in size was found in the transverse colon.  A polypectomy was performed with cold forceps.  The resection was complete, the polyp tissue was completely retrieved and sent to histology.   There was severe diverticulosis noted with associated muscular hypertrophy.   The examination was otherwise normal.  Retroflexed views revealed no abnormalities. The time to cecum=5 minutes 58 seconds.  Withdrawal time=9 minutes 52 seconds.  The scope was withdrawn and the procedure completed. COMPLICATIONS: There were  no immediate complications.  ENDOSCOPIC IMPRESSION: 1.   Sessile polyp measuring 3 mm in size was found in the transverse colon; polypectomy was performed with cold forceps 2.   There was severe diverticulosis noted 3.   The examination was otherwise normal - excellent prep  RECOMMENDATIONS: Await pathology results - probably does not need routine repeat colonoscopy  eSigned:  Gatha Mayer, MD, [facility] 08/18/2014 9:14 AM   cc: The Patient

## 2014-08-18 NOTE — Progress Notes (Signed)
Report to PACU, RN, vss, BBS= Clear.  

## 2014-08-18 NOTE — Patient Instructions (Addendum)
I found and removed one tiny polyp that is benign. You also have a condition called diverticulosis - common and not usually a problem. Please read the handout provided.  I doubt you will need another routine colonoscopy - i will send a letter.  I appreciate the opportunity to care for you. Gatha Mayer, MD, FACG    YOU HAD AN ENDOSCOPIC PROCEDURE TODAY AT [facility]: Refer to the procedure report that was given to you for any specific questions about what was found during the examination.  If the procedure report does not answer your questions, please call your gastroenterologist to clarify.  If you requested that your care partner not be given the details of your procedure findings, then the procedure report has been included in a sealed envelope for you to review at your convenience later.  YOU SHOULD EXPECT: Some feelings of bloating in the abdomen. Passage of more gas than usual.  Walking can help get rid of the air that was put into your GI tract during the procedure and reduce the bloating. If you had a lower endoscopy (such as a colonoscopy or flexible sigmoidoscopy) you may notice spotting of blood in your stool or on the toilet paper. If you underwent a bowel prep for your procedure, then you may not have a normal bowel movement for a few days.  DIET: Your first meal following the procedure should be a light meal and then it is ok to progress to your normal diet.  A half-sandwich or bowl of soup is an example of a good first meal.  Heavy or fried foods are harder to digest and may make you feel nauseous or bloated.  Likewise meals heavy in dairy and vegetables can cause extra gas to form and this can also increase the bloating.  Drink plenty of fluids but you should avoid alcoholic beverages for 24 hours.  ACTIVITY: Your care partner should take you home directly after the procedure.  You should plan to take it easy, moving slowly for the rest of the day.  You  can resume normal activity the day after the procedure however you should NOT DRIVE or use heavy machinery for 24 hours (because of the sedation medicines used during the test).    SYMPTOMS TO REPORT IMMEDIATELY: A gastroenterologist can be reached at any hour.  During normal business hours, 8:30 AM to 5:00 PM Monday through Friday, call [phone].  After hours and on weekends, please call the GI answering service at [phone] who will take a message and have the physician on call contact you.   Following lower endoscopy (colonoscopy or flexible sigmoidoscopy):  Excessive amounts of blood in the stool  Significant tenderness or worsening of abdominal pains  Swelling of the abdomen that is new, acute  Fever of 100F or higher  FOLLOW UP: If any biopsies were taken you will be contacted by phone or by letter within the next 1-3 weeks.  Call your gastroenterologist if you have not heard about the biopsies in 3 weeks.  Our staff will call the home number listed on your records the next business day following your procedure to check on you and address any questions or concerns that you may have at that time regarding the information given to you following your procedure. This is a courtesy call and so if there is no answer at the home number and we have not heard from you through the emergency physician on call, we  will assume that you have returned to your regular daily activities without incident.  SIGNATURES/CONFIDENTIALITY: You and/or your care partner have signed paperwork which will be entered into your electronic medical record.  These signatures attest to the fact that that the information above on your After Visit Summary has been reviewed and is understood.  Full responsibility of the confidentiality of this discharge information lies with you and/or your care-partner.    Handouts were given to your care partner on polyps and diverticulosis. You may resume your current  medications today. Await biopsy results. Please call if any questions or concerns.

## 2014-08-18 NOTE — Progress Notes (Signed)
Called to room to assist during endoscopic procedure.  Patient ID and intended procedure confirmed with present staff. Received instructions for my participation in the procedure from the performing physician.  

## 2014-08-18 NOTE — Progress Notes (Signed)
No problems noted in the recovery room. maw 

## 2014-08-19 ENCOUNTER — Telehealth: Payer: Self-pay | Admitting: *Deleted

## 2014-08-19 NOTE — Telephone Encounter (Signed)
  Follow up Call-  Call back number 08/18/2014  Post procedure Call Back phone  # [phone]  Permission to leave phone message Yes     Patient questions:  Do you have a fever, pain , or abdominal swelling? No. Pain Score  0 *  Have you tolerated food without any problems? Yes.    Have you been able to return to your normal activities? Yes.    Do you have any questions about your discharge instructions: Diet   No. Medications  No. Follow up visit  No.  Do you have questions or concerns about your Care? No.  Actions: * If pain score is 4 or above: No action needed, pain <4.

## 2014-08-26 ENCOUNTER — Encounter: Payer: Self-pay | Admitting: Internal Medicine

## 2014-08-26 ENCOUNTER — Telehealth: Payer: Self-pay | Admitting: Internal Medicine

## 2014-08-26 MED ORDER — PREDNISONE 10 MG PO TABS: ORAL_TABLET | ORAL | Status: DC

## 2014-08-26 NOTE — Telephone Encounter (Signed)
Patient informed. 

## 2014-08-26 NOTE — Telephone Encounter (Signed)
Patient stated that she is still having pain in her legs, could Dr Jenny Reichmann call her in a another refill of prednisone. Please avise

## 2014-08-26 NOTE — Telephone Encounter (Signed)
Ok, done erx to [facility]

## 2014-09-27 ENCOUNTER — Telehealth: Payer: Self-pay | Admitting: Internal Medicine

## 2014-09-27 MED ORDER — PREDNISONE 10 MG PO TABS: ORAL_TABLET | ORAL | Status: DC

## 2014-09-27 NOTE — Telephone Encounter (Signed)
This seems likely related to her back  [facility] for repeat predpac - done erx  Unfortunately however this cannot be an ongoing chronic medication due to risk of side effects thanks

## 2014-09-27 NOTE — Telephone Encounter (Signed)
Pain knees to feet, pt tried Prednisone and it worked - pt out of, can she get more? [facility] [phone]

## 2014-09-28 NOTE — Telephone Encounter (Signed)
Patient informed rx sent in. 

## 2014-10-13 ENCOUNTER — Other Ambulatory Visit: Payer: Self-pay

## 2014-10-13 DIAGNOSIS — Z1231 Encounter for screening mammogram for malignant neoplasm of breast: Secondary | ICD-10-CM

## 2014-10-13 DIAGNOSIS — Z9011 Acquired absence of right breast and nipple: Secondary | ICD-10-CM

## 2014-11-10 ENCOUNTER — Ambulatory Visit: Payer: Medicare Other

## 2014-11-22 ENCOUNTER — Ambulatory Visit
Admission: RE | Admit: 2014-11-22 | Discharge: 2014-11-22 | Disposition: A | Discharge status: Home / Self Care | Payer: Self-pay | Source: Ambulatory Visit

## 2014-11-22 DIAGNOSIS — Z1231 Encounter for screening mammogram for malignant neoplasm of breast: Secondary | ICD-10-CM

## 2014-11-22 DIAGNOSIS — Z9011 Acquired absence of right breast and nipple: Secondary | ICD-10-CM

## 2014-12-05 ENCOUNTER — Other Ambulatory Visit: Payer: Self-pay

## 2014-12-08 ENCOUNTER — Telehealth: Payer: Self-pay | Admitting: Cardiology

## 2014-12-08 ENCOUNTER — Encounter: Payer: Self-pay | Admitting: Cardiology

## 2014-12-08 ENCOUNTER — Ambulatory Visit (INDEPENDENT_AMBULATORY_CARE_PROVIDER_SITE_OTHER): Payer: Medicare Other | Admitting: Cardiology

## 2014-12-08 VITALS — BP 110/74 | HR 88 | Ht 61.0 in | Wt 157.0 lb

## 2014-12-08 DIAGNOSIS — I471 Supraventricular tachycardia: Secondary | ICD-10-CM

## 2014-12-08 DIAGNOSIS — R002 Palpitations: Secondary | ICD-10-CM

## 2014-12-08 DIAGNOSIS — I10 Essential (primary) hypertension: Secondary | ICD-10-CM

## 2014-12-08 DIAGNOSIS — E7800 Pure hypercholesterolemia, unspecified: Secondary | ICD-10-CM

## 2014-12-08 DIAGNOSIS — F419 Anxiety disorder, unspecified: Secondary | ICD-10-CM

## 2014-12-08 MED ORDER — ALPRAZOLAM 0.25 MG PO TABS: 0.2500 mg | ORAL_TABLET | Freq: Every evening | ORAL | Status: DC | PRN

## 2014-12-08 NOTE — Telephone Encounter (Signed)
New Message        Pt calling stating that she is having some issues with her heart and feels like it is in her throat. Pt states she is not having chest pain but just feels funny. Please call back and advise.

## 2014-12-08 NOTE — Progress Notes (Signed)
Cardiology Office Note   Date:  12/08/2014   ID:  Leslie Bryant 02-22-1941, MRN [id number]  PCP:  Leslie Cower, Bryant  Cardiologist:   Leslie Coco, Bryant   No chief complaint on file.     History of Present Illness: Leslie Bryant is a 74 y.o. female who presents for evaluation of forceful heartbeat.  She is seen work in office visit today.  This pleasant 74 year old woman  has a past history of high cholesterol and a history of palpitations. She does not have any history of ischemic heart disease. 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 She has normal left ventricular function. She was in good health until December 2012 when she developed chills fever and abdominal pain and was hospitalized with acute diverticulitis with mini perforation and developed a abscess which had to be drained by Dr. Hassell Bryant. The patient had not been aware that she had diverticulosis until she had the episode of severe diverticulitis. She had left total knee replacement on 07/21/13 by Leslie Bryant.  She has had weakness in her legs.  She is unable to tolerate statin therapy because of leg weakness.  She had been taking ezetimibe.  She developed the same type of leg weakness on ezetimibe.  When she stopped ezetimibe, the weakness improved. She was in her usual state of health until yesterday and she felt her heart pounding in her throat.  The heart rate was not fast but merely very forceful. One day earlier her pet dog had died after kidney surgery. The dog was 31 years old. She was very saddened by his death. She came in today to be sure her heart was okay after these symptoms yesterday.  Past Medical History  Diagnosis Date  . Palpitations IRREGULAR HEARTBEAT--  CONTROLLED W/  BETA BLOCKER  . Hypercholesterolemia   . GERD  (gastroesophageal reflux disease)   . Diverticulosis of colon     sigmoid  . H/O hiatal hernia   . Arthritis KNEES  . Acute meniscal tear of knee LEFT KNEE  . Left knee pain   . Swelling of left knee joint   . 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 Cancer     breast cancer right  . Diverticulitis of large intestine with perforation 11/02/2011    Microperforation probably related to nut and popcorn consumption.  Resolved by CT scan Recurrent episode clinical dx 07/2012     Past Surgical History  Procedure Laterality Date  . Cardiac catheterization  12-22-2000  DR Leslie Bryant    NORMAL LVF/ NORMAL CORONARY ARTERIES  . Vaginal hysterectomy  1966  . Breast recontruction w/ implants  1987  . Mastectomy  1986    RIGHT BREAST W/ MULTIPLE NODE DISSECTION  . Cholecystectomy  1988  . Knee arthroscopy  2011    RIGHT KNEE  . Knee arthroscopy with medial menisectomy  09/08/2012    Procedure: KNEE ARTHROSCOPY WITH MEDIAL MENISECTOMY;  Surgeon: Leslie Bryant;  Location: [place];  Service: Orthopedics;  Laterality: Left;  lateral tibial plateau,   . Chondroplasty  09/08/2012    Procedure: CHONDROPLASTY;  Surgeon: Leslie Bryant;  Location: [facility];  Service: Orthopedics;  Laterality: Left;  . Total knee arthroplasty Left 07/21/2013    Procedure: TOTAL KNEE ARTHROPLASTY;  Surgeon:  Leslie Bryant;  Location: [place];  Service: Orthopedics;  Laterality: Left;  Left Total Knee Arthroplasty  . Colonoscopy      last colon 05-03-2009  . Polypectomy       Current Outpatient Prescriptions  Medication Sig Dispense Refill  . aspirin 81 MG tablet Take 81 mg by mouth 2 (two) times daily.    . bifidobacterium infantis (ALIGN) capsule Take 1 capsule by mouth every morning.     Marland Kitchen BIOTIN 5000 PO Take 1 capsule by mouth daily.    . Calcium-Magnesium-Vitamin D (CALCIUM 1200+D3 PO) Take 1 tablet by mouth every morning.    . celecoxib (CELEBREX) 200 MG capsule Take  200 mg by mouth daily.     . Cholecalciferol (VITAMIN D) 2000 UNITS tablet Take 2,000 Units by mouth at bedtime.    Marland Kitchen co-enzyme Q-10 30 MG capsule Take 30 mg by mouth 2 (two) times daily.     . fish oil-omega-3 fatty acids 1000 MG capsule Take 1 g by mouth 2 (two) times daily.    . Magnesium 400 MG CAPS Take 400 mg by mouth daily.    . metoprolol (LOPRESSOR) 50 MG tablet Take 0.5 tablets (25 mg total) by mouth 2 (two) times daily. 90 tablet 1  . Multiple Vitamins-Minerals (CENTRUM SILVER PO) Take 1 tablet by mouth every morning.     Marland Kitchen omeprazole (PRILOSEC) 40 MG capsule TAKE ONE CAPSULE BY MOUTH EVERY DAY 90 capsule 1   No current facility-administered medications for this visit.    Allergies:   Crestor; Lipitor; Pravastatin; Zetia; and Penicillins    Social History:  The patient  reports that she has never smoked. She has never used smokeless tobacco. She reports that she drinks about 0.6 oz of alcohol per week. She reports that she does not use illicit drugs.   Family History:  The patient's family history includes Brain cancer in her sister; Cancer in her brother; Colon cancer in her brother; Heart attack in her father; Heart failure in her sister; Leukemia in her brother; Lung cancer in her sister.    ROS:  Please see the history of present illness.   Otherwise, review of systems are positive for none.   All other systems are reviewed and negative.    PHYSICAL EXAM: VS:  BP 110/74 mmHg  Pulse 88  Ht 5\' 1"  (1.549 m)  Wt 157 lb (71.215 kg)  BMI 29.68 kg/m2 , BMI Body mass index is 29.68 kg/(m^2). GEN: Well nourished, well developed, in no acute distress HEENT: normal Neck: no JVD, carotid bruits, or masses Cardiac: RRR; no murmurs, rubs, or gallops,no edema  Respiratory:  clear to auscultation bilaterally, normal work of breathing GI: soft, nontender, nondistended, + BS MS: no deformity or atrophy Skin: warm and dry, no rash Neuro:  Strength and sensation are intact Psych:  euthymic mood, full affect   EKG:  EKG is ordered today. The ekg ordered today demonstrates NSR.  One PAC is seen. No ischemic changes.   Recent Labs: 07/13/2014: ALT 30; BUN 17; Creatinine 1.0; Hemoglobin 12.3; Platelets 299.0; Potassium 4.7; Sodium 134*; TSH 1.18    Lipid Panel    Component Value Date/Time   CHOL 165 07/13/2014 1254   TRIG 218.0* 07/13/2014 1254   HDL 46.60 07/13/2014 1254   CHOLHDL 4 07/13/2014 1254   VLDL 43.6* 07/13/2014 1254   LDLCALC 90 12/01/2013 1008   LDLDIRECT 111.9 07/13/2014 1254      Wt Readings from Last 3 Encounters:  12/08/14 157 lb (71.215 kg)  08/04/14 154 lb 9.6 oz (70.126 kg)  08/03/14 153 lb (69.4 kg)      Other studies Reviewed:    ASSESSMENT AND PLAN:  1. Palpitations and forceful heart rate most likely secondary to stress. EKG today is unremarkable. 2.  Essential hypertension 3.  Hypercholesterolemia with intolerance to statins and ezetimibe 4.  Osteoarthritis 5.  Remote history of paroxysmal supraventricular tachycardia. 6.  Anxiety  Current medicines are reviewed at length with the patient today.  The patient does not have concerns regarding medicines.  The following changes have been made:  She will continue her current medication.  We will prescribe alprazolam 0.25 mg one daily if needed for stress.  I do not expect that she will need to take these very often. She is off all cholesterol medications now.  She will return tomorrow morning for fasting lab work. Recheck in 6 months for office visit and lab work  Labs/ tests ordered today include:   Orders Placed This Encounter  Procedures  . Lipid panel  . Hepatic function panel  . Basic metabolic panel  . EKG 12-Lead     Disposition:   FU with Dr. Mare Ferrari in 6 months for office visit lipid panel hepatic function panel and basal metabolic   Signed, Leslie Coco, Bryant  12/08/2014 4:45 PM    [facility] [address], [place], [place]   [address] Phone: [phone]; Fax: [phone]

## 2014-12-08 NOTE — Telephone Encounter (Signed)
Scheduled patient appointment today with  Dr. Mare Ferrari

## 2014-12-08 NOTE — Patient Instructions (Signed)
Rx for Xanax 0.25 mg 1 tablet daily as needed for stress [facility]  Return in the morning anytime after 7:30 for fasting labs  Your physician wants you to follow-up in: 6 months with fasting labs (lp/bmet/hfp)  You will receive a reminder letter in the mail two months in advance. If you don't receive a letter, please call our office to schedule the follow-up appointment.

## 2014-12-08 NOTE — Telephone Encounter (Signed)
Left message to call back  Can see today if patient can come in

## 2014-12-08 NOTE — Telephone Encounter (Signed)
Follow Up ° °Pt returned call//  °

## 2014-12-09 ENCOUNTER — Other Ambulatory Visit (INDEPENDENT_AMBULATORY_CARE_PROVIDER_SITE_OTHER): Payer: Medicare Other | Admitting: *Deleted

## 2014-12-09 DIAGNOSIS — E7800 Pure hypercholesterolemia, unspecified: Secondary | ICD-10-CM

## 2014-12-09 LAB — BASIC METABOLIC PANEL
BUN: 15 mg/dL (ref 6–23)
CHLORIDE: 99 meq/L (ref 96–112)
CO2: 27 meq/L (ref 19–32)
Calcium: 9.7 mg/dL (ref 8.4–10.5)
Creatinine, Ser: 0.81 mg/dL (ref 0.40–1.20)
GFR: 73.38 mL/min (ref >60.00)
GLUCOSE: 100 mg/dL — AB (ref 70–99)
Potassium: 4.3 mEq/L (ref 3.5–5.1)
Sodium: 134 mEq/L — ABNORMAL LOW (ref 135–145)

## 2014-12-09 LAB — LIPID PANEL
Cholesterol: 206 mg/dL — ABNORMAL HIGH (ref 0–200)
HDL: 50.2 mg/dL (ref >39.00)
LDL CALC: 127 mg/dL — AB (ref 0–99)
NONHDL: 155.8
TRIGLYCERIDES: 145 mg/dL (ref 0.0–149.0)
Total CHOL/HDL Ratio: 4
VLDL: 29 mg/dL (ref 0.0–40.0)

## 2014-12-09 LAB — HEPATIC FUNCTION PANEL
ALT: 25 U/L (ref 0–35)
AST: 24 U/L (ref 0–37)
Albumin: 4 g/dL (ref 3.5–5.2)
Alkaline Phosphatase: 69 U/L (ref 39–117)
Bilirubin, Direct: 0.1 mg/dL (ref 0.0–0.3)
Total Bilirubin: 0.4 mg/dL (ref 0.2–1.2)
Total Protein: 7.4 g/dL (ref 6.0–8.3)

## 2014-12-12 NOTE — Progress Notes (Signed)
Quick Note:  Please report to patient. The recent labs are stable. Continue same medication and careful diet. Cholesterol is higher. LDL is higher. Watch diet more carefully. ______

## 2014-12-28 ENCOUNTER — Encounter: Payer: Self-pay | Admitting: Podiatry

## 2014-12-28 ENCOUNTER — Ambulatory Visit (INDEPENDENT_AMBULATORY_CARE_PROVIDER_SITE_OTHER): Payer: Medicare Other | Admitting: Podiatry

## 2014-12-28 VITALS — BP 131/73 | HR 96 | Ht 61.0 in | Wt 157.0 lb

## 2014-12-28 DIAGNOSIS — M21969 Unspecified acquired deformity of unspecified lower leg: Secondary | ICD-10-CM

## 2014-12-28 DIAGNOSIS — M722 Plantar fascial fibromatosis: Secondary | ICD-10-CM

## 2014-12-28 NOTE — Patient Instructions (Signed)
Seen for right heel pain. Noted of weakened first Metatarsal bone. May benefit from well supported shoe gear and avoid flat sleepers or barefooted. Return as needed.

## 2014-12-28 NOTE — Progress Notes (Signed)
Subjective: 74 year old female presents complaining of right heel pain for over 2 months and gradually getting worse. This is the first episode. On feet x off and on at home. Starts hurting when getting out of bed in the morning and after been sitting for a while, takes time to settle down.   Review of Systems - Negative except Pain in right knee.    Objective: Neurovascular status are within normal. No edema or erythema noted. No abnormal skin lesions. Orthopedic:  Elevated first ray, forefoot varus bilateral. Normal ankle joint motion. No other gross deformities.  Assessment: Plantar fasciitis right. Elevated first ray bilateral.  Plan: Reviewed findings and available options. Advised to change house sleepers to well supported sandals. Patient refused cortisone injection. Discussed the benefit of custom orthotics. Return as needed.

## 2015-01-13 ENCOUNTER — Encounter: Payer: Self-pay | Admitting: Podiatry

## 2015-01-13 ENCOUNTER — Ambulatory Visit (INDEPENDENT_AMBULATORY_CARE_PROVIDER_SITE_OTHER): Payer: Medicare Other | Admitting: Podiatry

## 2015-01-13 VITALS — BP 131/68 | HR 80

## 2015-01-13 DIAGNOSIS — M722 Plantar fascial fibromatosis: Secondary | ICD-10-CM | POA: Diagnosis not present

## 2015-01-13 DIAGNOSIS — M21969 Unspecified acquired deformity of unspecified lower leg: Secondary | ICD-10-CM

## 2015-01-13 NOTE — Progress Notes (Signed)
Patient returns requesting injection for the right heel pain. No new changes other than right heel pain. Cortisone injection given to right heel.  Injected with mixture of 4 mg Dexamethasone, 4 mg Triamcinolone, and 1 cc of 0.5% Marcaine plain. Patient tolerated well without difficulty.  Return as needed.

## 2015-01-13 NOTE — Patient Instructions (Signed)
Seen for right heel pain. Injection given. Return as needed. 

## 2015-01-18 ENCOUNTER — Encounter: Payer: Self-pay | Admitting: Podiatry

## 2015-01-18 ENCOUNTER — Ambulatory Visit (INDEPENDENT_AMBULATORY_CARE_PROVIDER_SITE_OTHER): Payer: Medicare Other | Admitting: Podiatry

## 2015-01-18 VITALS — BP 140/73 | HR 81

## 2015-01-18 DIAGNOSIS — M79673 Pain in unspecified foot: Secondary | ICD-10-CM | POA: Diagnosis not present

## 2015-01-18 DIAGNOSIS — M7730 Calcaneal spur, unspecified foot: Secondary | ICD-10-CM | POA: Diagnosis not present

## 2015-01-18 DIAGNOSIS — M722 Plantar fascial fibromatosis: Secondary | ICD-10-CM

## 2015-01-18 DIAGNOSIS — M21969 Unspecified acquired deformity of unspecified lower leg: Secondary | ICD-10-CM | POA: Diagnosis not present

## 2015-01-18 NOTE — Patient Instructions (Signed)
Seen for right heel pain. Night splint dispensed. Both feet casted for orthotics.

## 2015-01-18 NOTE — Progress Notes (Signed)
Subjective: 74 year old female presents stating that last injection did not help.   Objective: Neurovascular status are within normal. No edema or erythema noted. No abnormal skin lesions. Orthopedic: Elevated first ray, forefoot varus bilateral. Normal ankle joint motion. No other gross deformities. Radiographic examination reveal high arched foot with elevated first metatarsal bone, and plantar calcaneal spur bilateral. AP view show short first Metatarsal bilateral.  Assessment: Plantar fasciitis right. Elevated first ray bilateral.  Plan: Reviewed findings and available options. Night splint dispensed x 1. Both feet casted for orthotics.

## 2015-02-07 ENCOUNTER — Ambulatory Visit: Payer: Medicare Other | Admitting: Internal Medicine

## 2015-02-21 ENCOUNTER — Telehealth: Payer: Self-pay | Admitting: Cardiology

## 2015-02-21 MED ORDER — FUROSEMIDE 20 MG PO TABS: ORAL_TABLET | ORAL | Status: DC

## 2015-02-21 NOTE — Telephone Encounter (Signed)
New message        Pt is requesting a prescription for fluid pills

## 2015-02-21 NOTE — Telephone Encounter (Signed)
The swelling may be from the shot which might have been a steroid shot causing fluid retention.  We can give her furosemide 20 mg tablet one daily when necessary for excessive swelling

## 2015-02-21 NOTE — Telephone Encounter (Signed)
Feet have been swelling, started yesterday Right foot worse than left. She did have injection in right knee today Swelling does go down at night Drinks plenty of water, no change in diet but does not watch her salt intake. Does not salt her food Denies shortness of breath or discoloration  Has been on her feet more last couple of days  If she presses foot with finger it does not leave an indention  Will forward to  Dr. Mare Ferrari for review

## 2015-02-21 NOTE — Telephone Encounter (Signed)
Advised patient.  Patient will continue to monitor and call back if no better

## 2015-03-10 ENCOUNTER — Ambulatory Visit: Payer: Medicare Other | Admitting: Podiatry

## 2015-03-13 ENCOUNTER — Other Ambulatory Visit: Payer: Self-pay | Admitting: Cardiology

## 2015-03-15 ENCOUNTER — Other Ambulatory Visit: Payer: Self-pay | Admitting: Cardiology

## 2015-04-24 ENCOUNTER — Ambulatory Visit: Payer: Medicare Other | Admitting: Cardiology

## 2015-05-01 ENCOUNTER — Other Ambulatory Visit: Payer: Self-pay

## 2015-05-09 ENCOUNTER — Other Ambulatory Visit: Payer: Self-pay | Admitting: Cardiology

## 2015-05-24 ENCOUNTER — Ambulatory Visit: Payer: Medicare Other | Admitting: Internal Medicine

## 2015-06-14 ENCOUNTER — Ambulatory Visit: Payer: Medicare Other | Admitting: Internal Medicine

## 2015-07-14 ENCOUNTER — Other Ambulatory Visit: Payer: Self-pay | Admitting: Cardiology

## 2015-07-21 ENCOUNTER — Encounter: Payer: Self-pay | Admitting: Internal Medicine

## 2015-07-21 ENCOUNTER — Other Ambulatory Visit (INDEPENDENT_AMBULATORY_CARE_PROVIDER_SITE_OTHER): Payer: Medicare Other

## 2015-07-21 ENCOUNTER — Ambulatory Visit (INDEPENDENT_AMBULATORY_CARE_PROVIDER_SITE_OTHER): Payer: Medicare Other | Admitting: Internal Medicine

## 2015-07-21 VITALS — BP 120/78 | HR 90 | Temp 98.3°F | Ht 61.0 in | Wt 156.0 lb

## 2015-07-21 DIAGNOSIS — I10 Essential (primary) hypertension: Secondary | ICD-10-CM | POA: Diagnosis not present

## 2015-07-21 DIAGNOSIS — M8580 Other specified disorders of bone density and structure, unspecified site: Secondary | ICD-10-CM

## 2015-07-21 DIAGNOSIS — Z0000 Encounter for general adult medical examination without abnormal findings: Secondary | ICD-10-CM

## 2015-07-21 DIAGNOSIS — E785 Hyperlipidemia, unspecified: Secondary | ICD-10-CM | POA: Diagnosis not present

## 2015-07-21 DIAGNOSIS — Z23 Encounter for immunization: Secondary | ICD-10-CM

## 2015-07-21 LAB — LIPID PANEL
CHOLESTEROL: 205 mg/dL — AB (ref 0–200)
HDL: 51.1 mg/dL (ref >39.00)
LDL CALC: 124 mg/dL — AB (ref 0–99)
NonHDL: 154.28
TRIGLYCERIDES: 151 mg/dL — AB (ref 0.0–149.0)
Total CHOL/HDL Ratio: 4
VLDL: 30.2 mg/dL (ref 0.0–40.0)

## 2015-07-21 LAB — HEPATIC FUNCTION PANEL
ALBUMIN: 4.1 g/dL (ref 3.5–5.2)
ALK PHOS: 63 U/L (ref 39–117)
ALT: 21 U/L (ref 0–35)
AST: 17 U/L (ref 0–37)
Bilirubin, Direct: 0.1 mg/dL (ref 0.0–0.3)
Total Bilirubin: 0.4 mg/dL (ref 0.2–1.2)
Total Protein: 7.5 g/dL (ref 6.0–8.3)

## 2015-07-21 LAB — TSH: TSH: 1.01 u[IU]/mL (ref 0.35–4.50)

## 2015-07-21 LAB — URINALYSIS, ROUTINE W REFLEX MICROSCOPIC
BILIRUBIN URINE: NEGATIVE
HGB URINE DIPSTICK: NEGATIVE
Ketones, ur: NEGATIVE
LEUKOCYTES UA: NEGATIVE
NITRITE: NEGATIVE
RBC / HPF: NONE SEEN (ref 0–?)
Specific Gravity, Urine: 1.01 (ref 1.000–1.030)
Total Protein, Urine: NEGATIVE
Urine Glucose: NEGATIVE
Urobilinogen, UA: 0.2 (ref 0.0–1.0)
WBC, UA: NONE SEEN (ref 0–?)
pH: 6 (ref 5.0–8.0)

## 2015-07-21 LAB — CBC WITH DIFFERENTIAL/PLATELET
Basophils Absolute: 0 10*3/uL (ref 0.0–0.1)
Basophils Relative: 0.2 % (ref 0.0–3.0)
EOS PCT: 1.4 % (ref 0.0–5.0)
Eosinophils Absolute: 0.1 10*3/uL (ref 0.0–0.7)
HEMATOCRIT: 36.9 % (ref 36.0–46.0)
HEMOGLOBIN: 12.3 g/dL (ref 12.0–15.0)
LYMPHS ABS: 2.6 10*3/uL (ref 0.7–4.0)
Lymphocytes Relative: 35.5 % (ref 12.0–46.0)
MCHC: 33.4 g/dL (ref 30.0–36.0)
MCV: 91.4 fl (ref 78.0–100.0)
MONOS PCT: 9.3 % (ref 3.0–12.0)
Monocytes Absolute: 0.7 10*3/uL (ref 0.1–1.0)
Neutro Abs: 4 10*3/uL (ref 1.4–7.7)
Neutrophils Relative %: 53.6 % (ref 43.0–77.0)
Platelets: 308 10*3/uL (ref 150.0–400.0)
RBC: 4.04 Mil/uL (ref 3.87–5.11)
RDW: 12.4 % (ref 11.5–15.5)
WBC: 7.4 10*3/uL (ref 4.0–10.5)

## 2015-07-21 LAB — BASIC METABOLIC PANEL
BUN: 20 mg/dL (ref 6–23)
CALCIUM: 9.6 mg/dL (ref 8.4–10.5)
CO2: 28 meq/L (ref 19–32)
Chloride: 98 mEq/L (ref 96–112)
Creatinine, Ser: 0.83 mg/dL (ref 0.40–1.20)
GFR: 71.22 mL/min (ref >60.00)
GLUCOSE: 91 mg/dL (ref 70–99)
Potassium: 5.1 mEq/L (ref 3.5–5.1)
Sodium: 133 mEq/L — ABNORMAL LOW (ref 135–145)

## 2015-07-21 NOTE — Progress Notes (Signed)
Pre visit review using our clinic review tool, if applicable. No additional management support is needed unless otherwise documented below in the visit note. 

## 2015-07-21 NOTE — Progress Notes (Signed)
Subjective:    Patient ID: Leslie Bryant, female    DOB: 01-19-1941, 74 y.o.   MRN: [id number]  HPI  Here for wellness and f/u;  Overall doing ok;  Pt denies Chest pain, worsening SOB, DOE, wheezing, orthopnea, PND, worsening LE edema, palpitations, dizziness or syncope.  Pt denies neurological change such as new headache, facial or extremity weakness.  Pt denies polydipsia, polyuria, or low sugar symptoms. Pt states overall good compliance with treatment and medications, good tolerability, and has been trying to follow appropriate diet.  Pt denies worsening depressive symptoms, suicidal ideation or panic. No fever, night sweats, wt loss, loss of appetite, or other constitutional symptoms.  Pt states good ability with ADL's, has low fall risk, home safety reviewed and adequate, no other significant changes in hearing or vision, and only occasionally active with exercise.  May need right knee replacement soon, has been doing ok with coritsone for now. S/p left TKR and did well. Quit zetia since her legs were hurting below the knees, but pain persists, so considering starting back on it. Asks for lipids with labs today; Due for DXA and flu/pneumovax Past Medical History  Diagnosis Date  . Palpitations IRREGULAR HEARTBEAT--  CONTROLLED W/  BETA BLOCKER  . Hypercholesterolemia   . GERD (gastroesophageal reflux disease)   . Diverticulosis of colon     sigmoid  . H/O hiatal hernia   . Arthritis KNEES  . Acute meniscal tear of knee LEFT KNEE  . Left knee pain   . Swelling of left knee joint   . 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 Cancer     breast cancer right  . Diverticulitis of large intestine with perforation 11/02/2011    Microperforation probably related to nut and popcorn consumption.  Resolved by CT scan Recurrent episode clinical dx 07/2012    Past Surgical History  Procedure Laterality Date  . Cardiac catheterization  12-22-2000  DR BRACKBILL    NORMAL LVF/ NORMAL CORONARY ARTERIES  .  Vaginal hysterectomy  1966  . Breast recontruction w/ implants  1987  . Mastectomy  1986    RIGHT BREAST W/ MULTIPLE NODE DISSECTION  . Cholecystectomy  1988  . Knee arthroscopy  2011    RIGHT KNEE  . Knee arthroscopy with medial menisectomy  09/08/2012    Procedure: KNEE ARTHROSCOPY WITH MEDIAL MENISECTOMY;  Surgeon: Tobi Bastos, MD;  Location: [place];  Service: Orthopedics;  Laterality: Left;  lateral tibial plateau,   . Chondroplasty  09/08/2012    Procedure: CHONDROPLASTY;  Surgeon: Tobi Bastos, MD;  Location: [facility];  Service: Orthopedics;  Laterality: Left;  . Total knee arthroplasty Left 07/21/2013    Procedure: TOTAL KNEE ARTHROPLASTY;  Surgeon: Newt Minion, MD;  Location: [place];  Service: Orthopedics;  Laterality: Left;  Left Total Knee Arthroplasty  . Colonoscopy      last colon 05-03-2009  . Polypectomy      reports that she has never smoked. She has never used smokeless tobacco. She reports that she drinks about 0.6 oz of alcohol per week. She reports that she does not use illicit drugs. family history includes Brain cancer in her sister; Cancer in her brother; Colon cancer in her brother; Heart attack in her father; Heart failure in her sister; Leukemia in her brother; Lung cancer in her sister. Allergies  Allergen Reactions  . Crestor [Rosuvastatin Calcium] Other (See Comments)    Shoulder  pain  . Lipitor [Atorvastatin Calcium] Other (See Comments)    Myalgias   . Pravastatin Other (See Comments)    myalgias  . Zetia [Ezetimibe]     Leg muscle weakness  . Penicillins Itching   . Current Outpatient Prescriptions on File Prior to Visit  Medication Sig Dispense Refill  . ALPRAZolam (XANAX) 0.25 MG tablet Take 1 tablet (0.25 mg total) by mouth at bedtime as needed (for stress). 30 tablet 0  . aspirin 81 MG tablet Take 81 mg by mouth 2 (two) times daily.    . bifidobacterium infantis (ALIGN) capsule Take 1 capsule by mouth  every morning.     Marland Kitchen BIOTIN 5000 PO Take 1 capsule by mouth daily.    . Calcium-Magnesium-Vitamin D (CALCIUM 1200+D3 PO) Take 1 tablet by mouth every morning.    . Cholecalciferol (VITAMIN D) 2000 UNITS tablet Take 2,000 Units by mouth at bedtime.    Marland Kitchen co-enzyme Q-10 30 MG capsule Take 30 mg by mouth 2 (two) times daily.     . fish oil-omega-3 fatty acids 1000 MG capsule Take 1 g by mouth 2 (two) times daily.    . furosemide (LASIX) 20 MG tablet One tablet daily by mouth as needed for swelling 30 tablet 3  . Magnesium 400 MG CAPS Take 400 mg by mouth daily.    . metoprolol (LOPRESSOR) 50 MG tablet TAKE 1/2 BY MOUTH TWICE DAILY 90 tablet 0  . Multiple Vitamins-Minerals (CENTRUM SILVER PO) Take 1 tablet by mouth every morning.     Marland Kitchen omeprazole (PRILOSEC) 40 MG capsule TAKE 1 BY MOUTH DAILY (CONTACT YOUR PROVIDER FOR AN APPOINTMENT FOR FURTHER REFILLS) 90 capsule 0   No current facility-administered medications on file prior to visit.     Review of Systems Constitutional: Negative for increased diaphoresis, other activity, appetite or siginficant weight change other than noted HENT: Negative for worsening hearing loss, ear pain, facial swelling, mouth sores and neck stiffness.   Eyes: Negative for other worsening pain, redness or visual disturbance.  Respiratory: Negative for shortness of breath and wheezing  Cardiovascular: Negative for chest pain and palpitations.  Gastrointestinal: Negative for diarrhea, blood in stool, abdominal distention or other pain Genitourinary: Negative for hematuria, flank pain or change in urine volume.  Musculoskeletal: Negative for myalgias or other joint complaints.  Skin: Negative for color change and wound or drainage.  Neurological: Negative for syncope and numbness. other than noted Hematological: Negative for adenopathy. or other swelling Psychiatric/Behavioral: Negative for hallucinations, SI, self-injury, decreased concentration or other worsening  agitation.      Objective:   Physical Exam BP 120/78 mmHg  Pulse 90  Temp(Src) 98.3 F (36.8 C) (Oral)  Ht 5\' 1"  (1.549 m)  Wt 156 lb (70.761 kg)  BMI 29.49 kg/m2  SpO2 96% VS noted,  Constitutional: Pt is oriented to person, place, and time. Appears well-developed and well-nourished, in no significant distress Head: Normocephalic and atraumatic.  Right Ear: External ear normal.  Left Ear: External ear normal.  Nose: Nose normal.  Mouth/Throat: Oropharynx is clear and moist.  Eyes: Conjunctivae and EOM are normal. Pupils are equal, round, and reactive to light.  Neck: Normal range of motion. Neck supple. No JVD present. No tracheal deviation present or significant neck LA or mass Cardiovascular: Normal rate, regular rhythm, normal heart sounds and intact distal pulses.   Pulmonary/Chest: Effort normal and breath sounds without rales or wheezing  Abdominal: Soft. Bowel sounds are normal. NT. No HSM  Musculoskeletal: Normal  range of motion. Exhibits no edema.  Lymphadenopathy:  Has no cervical adenopathy.  Neurological: Pt is alert and oriented to person, place, and time. Pt has normal reflexes. No cranial nerve deficit. Motor grossly intact Skin: Skin is warm and dry. No rash noted.  Psychiatric:  Has normal mood and affect. Behavior is normal.     Assessment & Plan:

## 2015-07-21 NOTE — Assessment & Plan Note (Signed)
stable overall by history and exam, recent data reviewed with pt, and pt to continue medical treatment as before,  to f/u any worsening symptoms or concerns BP Readings from Last 3 Encounters:  07/21/15 120/78  01/18/15 140/73  01/13/15 131/68

## 2015-07-21 NOTE — Patient Instructions (Addendum)
You had the flu shot today, and the Pnuemovax pneumonia shot  Please continue all other medications as before, and refills have been done if requested.  Please have the pharmacy call with any other refills you may need.  Please continue your efforts at being more active, low cholesterol diet, and weight control.  You are otherwise up to date with prevention measures today.  Please keep your appointments with your specialists as you may have planned  Please make an Appt for the Bone Density as you leave at the Scheduling desk  Please go to the LAB in the Basement (turn left off the elevator) for the tests to be done today  You will be contacted by phone if any changes need to be made immediately.  Otherwise, you will receive a letter about your results with an explanation, but please check with MyChart first.  Please remember to sign up for MyChart if you have not done so, as this will be important to you in the future with finding out test results, communicating by private email, and scheduling acute appointments online when needed.  Please return in 1 year for your yearly visit, or sooner if needed, with Lab testing done 3-5 days before

## 2015-07-21 NOTE — Assessment & Plan Note (Signed)

## 2015-07-27 ENCOUNTER — Inpatient Hospital Stay: Admission: RE | Admit: 2015-07-27 | Payer: Medicare Other | Source: Ambulatory Visit

## 2015-07-28 NOTE — Addendum Note (Signed)
Addended by: Lyman Bishop on: 07/28/2015 09:10 AM   Modules accepted: Orders

## 2015-08-03 ENCOUNTER — Ambulatory Visit (INDEPENDENT_AMBULATORY_CARE_PROVIDER_SITE_OTHER)
Admission: RE | Admit: 2015-08-03 | Discharge: 2015-08-03 | Disposition: A | Discharge status: Home / Self Care | Payer: Medicare Other | Source: Ambulatory Visit | Attending: Internal Medicine | Admitting: Internal Medicine

## 2015-08-03 DIAGNOSIS — M8580 Other specified disorders of bone density and structure, unspecified site: Secondary | ICD-10-CM

## 2015-08-07 ENCOUNTER — Encounter: Payer: Self-pay | Admitting: Cardiology

## 2015-08-07 ENCOUNTER — Ambulatory Visit (INDEPENDENT_AMBULATORY_CARE_PROVIDER_SITE_OTHER): Payer: Medicare Other | Admitting: Cardiology

## 2015-08-07 VITALS — BP 110/60 | HR 99 | Ht 62.0 in | Wt 157.0 lb

## 2015-08-07 DIAGNOSIS — I471 Supraventricular tachycardia: Secondary | ICD-10-CM | POA: Diagnosis not present

## 2015-08-07 DIAGNOSIS — E7800 Pure hypercholesterolemia, unspecified: Secondary | ICD-10-CM | POA: Diagnosis not present

## 2015-08-07 DIAGNOSIS — I10 Essential (primary) hypertension: Secondary | ICD-10-CM | POA: Diagnosis not present

## 2015-08-07 DIAGNOSIS — R002 Palpitations: Secondary | ICD-10-CM | POA: Diagnosis not present

## 2015-08-07 NOTE — Patient Instructions (Signed)
Medication Instructions:  RESTART ZETIA 10 MG DAILY   Labwork: NON  Testing/Procedures: NONE  Follow-Up:  Your physician recommends that you schedule a follow-up appointment in: 6 months with fasting labs (lp/bmet/hfp) WITH Tera Helper NP OR Nicki Reaper W PA

## 2015-08-07 NOTE — Progress Notes (Signed)
Cardiology Office Note   Date:  08/07/2015   ID:  Shekira, Kratzer [birth year], MRN [id number]  PCP:  [name], MD  Cardiologist: Darlin Coco MD  No chief complaint on file.     History of Present Illness: Leslie Bryant is a 74 y.o. female who presents for a six-month follow-up visit.  This pleasant 74 year old woman has a past history of high cholesterol and a history of palpitations. She does not have any history of ischemic heart disease. 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 She has normal left ventricular function. She was in good health until December 2012 when she developed chills fever and abdominal pain and was hospitalized with acute diverticulitis with mini perforation and developed a abscess which had to be drained by Dr. Hassell Done. The patient had not been aware that she had diverticulosis until she had the episode of severe diverticulitis. She had left total knee replacement on 07/21/13 by Dr. Sharol Given. She states that she also needs to have her right knee operated on but does not want to do it yet.  She has been getting steroids shots at intervals which help. His last visit she's had no racing of her heart. In retrospect she does not think that the ezetimibe was causing her leg weakness.  She would like to try it again.  Her LDL cholesterol is still elevated.  Past Medical History  Diagnosis Date  . Palpitations IRREGULAR HEARTBEAT--  CONTROLLED W/  BETA BLOCKER  . Hypercholesterolemia   . GERD (gastroesophageal reflux disease)   . Diverticulosis of colon     sigmoid  . H/O hiatal hernia   . Arthritis KNEES  . Acute meniscal tear of knee LEFT KNEE  . Left knee pain   . Swelling of left knee joint   . 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 Cancer [facility])     breast cancer  right  . Diverticulitis of large intestine with perforation 11/02/2011    Microperforation probably related to nut and popcorn consumption.  Resolved by CT scan Recurrent episode clinical dx 07/2012     Past Surgical History  Procedure Laterality Date  . Cardiac catheterization  12-22-2000  DR Osmani Kersten    NORMAL LVF/ NORMAL CORONARY ARTERIES  . Vaginal hysterectomy  1966  . Breast recontruction w/ implants  1987  . Mastectomy  1986    RIGHT BREAST W/ MULTIPLE NODE DISSECTION  . Cholecystectomy  1988  . Knee arthroscopy  2011    RIGHT KNEE  . Knee arthroscopy with medial menisectomy  09/08/2012    Procedure: KNEE ARTHROSCOPY WITH MEDIAL MENISECTOMY;  Surgeon: Tobi Bastos, MD;  Location: [place];  Service: Orthopedics;  Laterality: Left;  lateral tibial plateau,   . Chondroplasty  09/08/2012    Procedure: CHONDROPLASTY;  Surgeon: Tobi Bastos, MD;  Location: [facility];  Service: Orthopedics;  Laterality: Left;  . Total knee arthroplasty Left 07/21/2013    Procedure: TOTAL KNEE ARTHROPLASTY;  Surgeon: Newt Minion, MD;  Location: [place];  Service: Orthopedics;  Laterality: Left;  Left Total Knee Arthroplasty  . Colonoscopy      last colon 05-03-2009  . Polypectomy       Current Outpatient Prescriptions  Medication Sig Dispense Refill  . ALPRAZolam (XANAX) 0.25 MG tablet  Take 1 tablet (0.25 mg total) by mouth at bedtime as needed (for stress). 30 tablet 0  . aspirin 81 MG tablet Take 81 mg by mouth 2 (two) times daily.    Marland Kitchen BIOTIN 5000 PO Take 1 capsule by mouth daily.    . Cholecalciferol (VITAMIN D) 2000 UNITS tablet Take 2,000 Units by mouth at bedtime.    Marland Kitchen co-enzyme Q-10 30 MG capsule Take 30 mg by mouth 2 (two) times daily.     Marland Kitchen ezetimibe (ZETIA) 10 MG tablet Take 10 mg by mouth daily.    . fish oil-omega-3 fatty acids 1000 MG capsule Take 1 g by mouth 2 (two) times daily.    . furosemide (LASIX) 20 MG tablet One tablet daily by mouth  as needed for swelling 30 tablet 3  . Magnesium 400 MG CAPS Take 400 mg by mouth daily.    . metoprolol (LOPRESSOR) 50 MG tablet Take 25 mg by mouth 2 (two) times daily.    . Multiple Vitamins-Minerals (CENTRUM SILVER PO) Take 1 tablet by mouth every morning.     Marland Kitchen omeprazole (PRILOSEC) 40 MG capsule Take 40 mg by mouth daily.    . traMADol (ULTRAM) 50 MG tablet TAKE 1 TABLET BY MOUTH TWICE A DAY AS NEEDED (MAX 2 PER DAY) for pain  0   No current facility-administered medications for this visit.    Allergies:   Crestor; Lipitor; Pravastatin; and Penicillins    Social History:  The patient  reports that she has never smoked. She has never used smokeless tobacco. She reports that she drinks about 0.6 oz of alcohol per week. She reports that she does not use illicit drugs.   Family History:  The patient's family history includes Brain cancer in her sister; Cancer in her brother; Colon cancer in her brother; Heart attack in her father; Heart failure in her sister; Leukemia in her brother; Lung cancer in her sister.    ROS:  Please see the history of present illness.   Otherwise, review of systems are positive for none.   All other systems are reviewed and negative.    PHYSICAL EXAM: VS:  BP 110/60 mmHg  Pulse 99  Ht 5\' 2"  (1.575 m)  Wt 157 lb (71.215 kg)  BMI 28.71 kg/m2 , BMI Body mass index is 28.71 kg/(m^2). GEN: Well nourished, well developed, in no acute distress HEENT: normal Neck: no JVD, carotid bruits, or masses Cardiac: RRR; no murmurs, rubs, or gallops,no edema  Respiratory:  clear to auscultation bilaterally, normal work of breathing GI: soft, nontender, nondistended, + BS MS: no deformity or atrophy Skin: warm and dry, no rash Neuro:  Strength and sensation are intact Psych: euthymic mood, full affect   EKG:  EKG is not ordered today.    Recent Labs: 07/21/2015: ALT 21; BUN 20; Creatinine, Ser 0.83; Hemoglobin 12.3; Platelets 308.0; Potassium 5.1; Sodium 133*; TSH  1.01    Lipid Panel    Component Value Date/Time   CHOL 205* 07/21/2015 1141   TRIG 151.0* 07/21/2015 1141   HDL 51.10 07/21/2015 1141   CHOLHDL 4 07/21/2015 1141   VLDL 30.2 07/21/2015 1141   LDLCALC 124* 07/21/2015 1141   LDLDIRECT 111.9 07/13/2014 1254      Wt Readings from Last 3 Encounters:  08/07/15 157 lb (71.215 kg)  07/21/15 156 lb (70.761 kg)  12/28/14 157 lb (71.215 kg)        ASSESSMENT AND PLAN:  1.  History of palpitations.  No recent  symptoms.. 2. Essential hypertension 3. Hypercholesterolemia with intolerance to statins. 4. Osteoarthritis 5. Remote history of paroxysmal supraventricular tachycardia. 6. Anxiety  Current medicines are reviewed at length with the patient today. The patient does not have concerns regarding medicines.  Disposition: Resume ezetimibe 10 mg one daily.. Recheck in 6 months for office visit and lab work   Current medicines are reviewed at length with the patient today.  The patient does not have concerns regarding medicines.  The following changes have been made:  no change  Labs/ tests ordered today include:  No orders of the defined types were placed in this encounter.     Disposition: Continue current medication and restart ezetimibe.  Recheck in 6 months for office visit lipid panel hepatic function panel and basal metabolic panel  Signed, Darlin Coco MD 08/07/2015 5:11 PM    [facility] [address], [place], [place]  [address] Phone: [phone]; Fax: [phone]

## 2015-09-25 ENCOUNTER — Encounter: Payer: Self-pay | Admitting: Internal Medicine

## 2015-10-12 ENCOUNTER — Other Ambulatory Visit: Payer: Self-pay | Admitting: Cardiology

## 2015-10-12 DIAGNOSIS — F419 Anxiety disorder, unspecified: Secondary | ICD-10-CM

## 2015-10-13 ENCOUNTER — Ambulatory Visit (INDEPENDENT_AMBULATORY_CARE_PROVIDER_SITE_OTHER): Payer: Medicare Other | Admitting: Internal Medicine

## 2015-10-13 ENCOUNTER — Encounter: Payer: Self-pay | Admitting: Internal Medicine

## 2015-10-13 VITALS — BP 126/74 | HR 95 | Temp 98.6°F | Ht 62.0 in | Wt 162.0 lb

## 2015-10-13 DIAGNOSIS — E785 Hyperlipidemia, unspecified: Secondary | ICD-10-CM | POA: Diagnosis not present

## 2015-10-13 DIAGNOSIS — I10 Essential (primary) hypertension: Secondary | ICD-10-CM | POA: Diagnosis not present

## 2015-10-13 DIAGNOSIS — J019 Acute sinusitis, unspecified: Secondary | ICD-10-CM | POA: Diagnosis not present

## 2015-10-13 MED ORDER — LEVOFLOXACIN 250 MG PO TABS: 250.0000 mg | ORAL_TABLET | Freq: Every day | ORAL | Status: DC

## 2015-10-13 MED ORDER — HYDROCODONE-HOMATROPINE 5-1.5 MG/5ML PO SYRP: 5.0000 mL | ORAL_SOLUTION | Freq: Four times a day (QID) | ORAL | Status: DC | PRN

## 2015-10-13 NOTE — Assessment & Plan Note (Signed)
Mild to mod, for antibx course,  to f/u any worsening symptoms or concerns 

## 2015-10-13 NOTE — Progress Notes (Signed)
Pre visit review using our clinic review tool, if applicable. No additional management support is needed unless otherwise documented below in the visit note. 

## 2015-10-13 NOTE — Patient Instructions (Signed)
Please take all new medication as prescribed - the antibiotic, and cough medicine  OK to re-start the zetia as you have planned  Please continue all other medications as before, and refills have been done if requested.  Please have the pharmacy call with any other refills you may need.  Please continue your efforts at being more active, low cholesterol diet, and weight control  Please keep your appointments with your specialists as you may have planned

## 2015-10-13 NOTE — Assessment & Plan Note (Signed)
stable overall by history and exam, recent data reviewed with pt, and pt to continue medical treatment as before,  to f/u any worsening symptoms or concerns BP Readings from Last 3 Encounters:  10/13/15 126/74  08/07/15 110/60  07/21/15 120/78

## 2015-10-13 NOTE — Progress Notes (Signed)
Subjective:    Patient ID: Leslie Bryant, female    DOB: [birth year], 74 y.o.   MRN: [id number]  HPI   Here with 2-3 days acute onset fever, facial pain, pressure, headache, general weakness and malaise, and greenish d/c, with mild ST and cough, but pt denies chest pain, wheezing, increased sob or doe, orthopnea, PND, increased LE swelling, palpitations, dizziness or syncope. Also states she is now ready to restart her zetia (has at hoome) as stopping did not seem improve any msk pains.  Pt denies new neurological symptoms such as new headache, or facial or extremity weakness or numbness   Pt denies polydipsia, polyuria Past Medical History  Diagnosis Date  . Palpitations IRREGULAR HEARTBEAT--  CONTROLLED W/  BETA BLOCKER  . Hypercholesterolemia   . GERD (gastroesophageal reflux disease)   . Diverticulosis of colon     sigmoid  . H/O hiatal hernia   . Arthritis KNEES  . Acute meniscal tear of knee LEFT KNEE  . Left knee pain   . Swelling of left knee joint   . 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 Cancer [facility])     breast cancer right  . Diverticulitis of large intestine with perforation 11/02/2011    Microperforation probably related to nut and popcorn consumption.  Resolved by CT scan Recurrent episode clinical dx 07/2012    Past Surgical History  Procedure Laterality Date  . Cardiac catheterization  12-22-2000  DR BRACKBILL    NORMAL LVF/ NORMAL CORONARY ARTERIES  . Vaginal hysterectomy  1966  . Breast recontruction w/ implants  1987  . Mastectomy  1986    RIGHT BREAST W/ MULTIPLE NODE DISSECTION  . Cholecystectomy  1988  . Knee arthroscopy  2011    RIGHT KNEE  . Knee arthroscopy with medial menisectomy  09/08/2012    Procedure: KNEE ARTHROSCOPY WITH MEDIAL MENISECTOMY;  Surgeon: Tobi Bastos, MD;  Location: [place];  Service: Orthopedics;  Laterality: Left;  lateral tibial plateau,   . Chondroplasty  09/08/2012    Procedure: CHONDROPLASTY;  Surgeon: Tobi Bastos, MD;  Location: [facility];  Service: Orthopedics;  Laterality: Left;  . Total knee arthroplasty Left 07/21/2013    Procedure: TOTAL KNEE ARTHROPLASTY;  Surgeon: Newt Minion, MD;  Location: [place];  Service: Orthopedics;  Laterality: Left;  Left Total Knee Arthroplasty  . Colonoscopy      last colon 05-03-2009  . Polypectomy      reports that she has never smoked. She has never used smokeless tobacco. She reports that she drinks about 0.6 oz of alcohol per week. She reports that she does not use illicit drugs. family history includes Brain cancer in her sister; Cancer in her brother; Colon cancer in her brother; Heart attack in her father; Heart failure in her sister; Leukemia in her brother; Lung cancer in her sister. Allergies  Allergen Reactions  . Crestor [Rosuvastatin Calcium] Other (See Comments)    Shoulder pain  . Lipitor [Atorvastatin Calcium] Other (See Comments)    Myalgias   . Pravastatin Other (See Comments)    myalgias  . Penicillins Itching   Current Outpatient Prescriptions on File Prior to Visit  Medication Sig Dispense Refill  . ALPRAZolam (XANAX) 0.25 MG tablet Take 1 tablet (0.25 mg total) by mouth at bedtime as needed (for stress). 30 tablet 0  . aspirin 81 MG tablet Take 81 mg by mouth 2 (two) times daily.    Marland Kitchen  BIOTIN 5000 PO Take 1 capsule by mouth daily.    . Cholecalciferol (VITAMIN D) 2000 UNITS tablet Take 2,000 Units by mouth at bedtime.    Marland Kitchen co-enzyme Q-10 30 MG capsule Take 30 mg by mouth 2 (two) times daily.     Marland Kitchen ezetimibe (ZETIA) 10 MG tablet Take 10 mg by mouth daily.    . fish oil-omega-3 fatty acids 1000 MG capsule Take 1 g by mouth 2 (two) times daily.    . furosemide (LASIX) 20 MG tablet One tablet daily by mouth as needed for swelling 30 tablet 3  . Magnesium 400 MG CAPS Take 400 mg by mouth daily.    . metoprolol (LOPRESSOR) 50 MG tablet Take 25 mg by mouth 2 (two) times daily.    . Multiple Vitamins-Minerals (CENTRUM  SILVER PO) Take 1 tablet by mouth every morning.     Marland Kitchen omeprazole (PRILOSEC) 40 MG capsule Take 40 mg by mouth daily.    . traMADol (ULTRAM) 50 MG tablet TAKE 1 TABLET BY MOUTH TWICE A DAY AS NEEDED (MAX 2 PER DAY) for pain  0   No current facility-administered medications on file prior to visit.   Review of Systems  Constitutional: Negative for unusual diaphoresis or night sweats HENT: Negative for ringing in ear or discharge Eyes: Negative for double vision or worsening visual disturbance.  Respiratory: Negative for choking and stridor.   Gastrointestinal: Negative for vomiting or other signifcant bowel change Genitourinary: Negative for hematuria or change in urine volume.  Musculoskeletal: Negative for other MSK pain or swelling Skin: Negative for color change and worsening wound.  Neurological: Negative for tremors and numbness other than noted  Psychiatric/Behavioral: Negative for decreased concentration or agitation other than above       Objective:   Physical Exam BP 126/74 mmHg  Pulse 95  Temp(Src) 98.6 F (37 C) (Oral)  Ht 5\' 2"  (1.575 m)  Wt 162 lb (73.483 kg)  BMI 29.62 kg/m2  SpO2 96% VS noted, mild ill Constitutional: Pt appears in no significant distress HENT: Head: NCAT.  Right Ear: External ear normal.  Left Ear: External ear normal.  Bilat tm's with mild erythema.  Max sinus areas mild tender.  Pharynx with mild erythema, no exudate Eyes: . Pupils are equal, round, and reactive to light. Conjunctivae and EOM are normal Neck: Normal range of motion. Neck supple.  Cardiovascular: Normal rate and regular rhythm.   Pulmonary/Chest: Effort normal and breath sounds without rales or wheezing.  Neurological: Pt is alert. Not confused , motor grossly intact Skin: Skin is warm. No rash, no LE edema Psychiatric: Pt behavior is normal. No agitation. Lab Results  Component Value Date   WBC 7.4 07/21/2015   HGB 12.3 07/21/2015   HCT 36.9 07/21/2015   PLT 308.0  07/21/2015   GLUCOSE 91 07/21/2015   CHOL 205* 07/21/2015   TRIG 151.0* 07/21/2015   HDL 51.10 07/21/2015   LDLDIRECT 111.9 07/13/2014   LDLCALC 124* 07/21/2015   ALT 21 07/21/2015   AST 17 07/21/2015   NA 133* 07/21/2015   K 5.1 07/21/2015   CL 98 07/21/2015   CREATININE 0.83 07/21/2015   BUN 20 07/21/2015   CO2 28 07/21/2015   TSH 1.01 07/21/2015   INR 1.01 07/13/2013        Assessment & Plan:

## 2015-10-13 NOTE — Assessment & Plan Note (Signed)
Lab Results  Component Value Date   CHOL 205* 07/21/2015   HDL 51.10 07/21/2015   LDLCALC 124* 07/21/2015   LDLDIRECT 111.9 07/13/2014   TRIG 151.0* 07/21/2015   CHOLHDL 4 07/21/2015   Pt to restart her home zetia, statin intolerant, to call here for further refills as Dr Mare Ferrari /card retiring soon

## 2015-10-18 NOTE — Telephone Encounter (Signed)
Verbal ok given by  Dr. Mare Ferrari

## 2015-11-05 DIAGNOSIS — J189 Pneumonia, unspecified organism: Secondary | ICD-10-CM

## 2015-11-05 HISTORY — DX: Pneumonia, unspecified organism: J18.9

## 2015-11-07 ENCOUNTER — Other Ambulatory Visit: Payer: Self-pay | Admitting: Cardiology

## 2015-12-14 ENCOUNTER — Other Ambulatory Visit: Payer: Self-pay | Admitting: *Deleted

## 2015-12-14 DIAGNOSIS — M1711 Unilateral primary osteoarthritis, right knee: Secondary | ICD-10-CM | POA: Diagnosis not present

## 2015-12-14 MED ORDER — METOPROLOL TARTRATE 50 MG PO TABS: ORAL_TABLET | ORAL | Status: DC

## 2015-12-14 MED ORDER — OMEPRAZOLE 40 MG PO CPDR: 40.0000 mg | DELAYED_RELEASE_CAPSULE | Freq: Every day | ORAL | Status: DC

## 2015-12-22 ENCOUNTER — Other Ambulatory Visit: Payer: Self-pay | Admitting: *Deleted

## 2015-12-22 ENCOUNTER — Other Ambulatory Visit (INDEPENDENT_AMBULATORY_CARE_PROVIDER_SITE_OTHER): Payer: Medicare Other | Admitting: *Deleted

## 2015-12-22 DIAGNOSIS — E7800 Pure hypercholesterolemia, unspecified: Secondary | ICD-10-CM | POA: Diagnosis not present

## 2015-12-22 DIAGNOSIS — E785 Hyperlipidemia, unspecified: Secondary | ICD-10-CM | POA: Diagnosis not present

## 2015-12-22 DIAGNOSIS — I10 Essential (primary) hypertension: Secondary | ICD-10-CM

## 2015-12-22 LAB — HEPATIC FUNCTION PANEL
ALBUMIN: 4 g/dL (ref 3.6–5.1)
ALK PHOS: 62 U/L (ref 33–130)
ALT: 19 U/L (ref 6–29)
AST: 19 U/L (ref 10–35)
Bilirubin, Direct: <0.1 mg/dL (ref <=0.2)
TOTAL PROTEIN: 7.1 g/dL (ref 6.1–8.1)
Total Bilirubin: 0.3 mg/dL (ref 0.2–1.2)

## 2015-12-22 LAB — BASIC METABOLIC PANEL
BUN: 15 mg/dL (ref 7–25)
CALCIUM: 9.3 mg/dL (ref 8.6–10.4)
CO2: 23 mmol/L (ref 20–31)
CREATININE: 0.73 mg/dL (ref 0.60–0.93)
Chloride: 99 mmol/L (ref 98–110)
Glucose, Bld: 95 mg/dL (ref 65–99)
Potassium: 4.3 mmol/L (ref 3.5–5.3)
Sodium: 135 mmol/L (ref 135–146)

## 2015-12-22 LAB — LIPID PANEL
Cholesterol: 194 mg/dL (ref 125–200)
HDL: 50 mg/dL (ref >=46)
LDL Cholesterol: 120 mg/dL (ref <130)
TRIGLYCERIDES: 119 mg/dL (ref <150)
Total CHOL/HDL Ratio: 3.9 Ratio (ref <=5.0)
VLDL: 24 mg/dL (ref <30)

## 2015-12-23 NOTE — Progress Notes (Signed)
Quick Note:  Please make copy of labs for patient visit. ______ 

## 2015-12-25 ENCOUNTER — Ambulatory Visit (INDEPENDENT_AMBULATORY_CARE_PROVIDER_SITE_OTHER): Payer: Medicare Other | Admitting: Cardiology

## 2015-12-25 ENCOUNTER — Encounter: Payer: Self-pay | Admitting: Cardiology

## 2015-12-25 VITALS — BP 140/80 | HR 96 | Ht 62.0 in | Wt 163.0 lb

## 2015-12-25 DIAGNOSIS — R002 Palpitations: Secondary | ICD-10-CM

## 2015-12-25 DIAGNOSIS — I10 Essential (primary) hypertension: Secondary | ICD-10-CM

## 2015-12-25 DIAGNOSIS — E7800 Pure hypercholesterolemia, unspecified: Secondary | ICD-10-CM | POA: Diagnosis not present

## 2015-12-25 DIAGNOSIS — I471 Supraventricular tachycardia: Secondary | ICD-10-CM | POA: Diagnosis not present

## 2015-12-25 NOTE — Patient Instructions (Signed)
Medication Instructions:  Your physician recommends that you continue on your current medications as directed. Please refer to the Current Medication list given to you today.  Labwork: none  Testing/Procedures: none  Follow-Up: Your physician wants you to follow-up in: 6 month ov with Dr Debara Pickett at the [facility] office You will receive a reminder letter in the mail two months in advance. If you don't receive a letter, please call our office to schedule the follow-up appointment.  If you need a refill on your cardiac medications before your next appointment, please call your pharmacy.

## 2015-12-25 NOTE — Progress Notes (Signed)
Cardiology Office Note   Date:  12/25/2015   ID:  Leslie, Bryant Oct 14, 1941, MRN [id number]  PCP:  [name], MD  Cardiologist: Darlin Coco MD  No chief complaint on file.     History of Present Illness: Leslie Bryant is a 75 y.o. female who presents for a scheduled visit  This pleasant 75 year old woman has a past history of high cholesterol and a history of palpitations.  She has a remote history of paroxysmal supraventricular tachycardia.  She does not have any history of ischemic heart disease. 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 She has normal left ventricular function. She was in good health until December 2012 when she developed chills fever and abdominal pain and was hospitalized with acute diverticulitis with mini perforation and developed a abscess which had to be drained by Dr. Hassell Done. The patient had not been aware that she had diverticulosis until she had the episode of severe diverticulitis. She had left total knee replacement on 07/21/13 by Dr. Sharol Given.  At her last office visit she felt that the ezetimibe had been falsely accused of causing her to have myalgias.  She wanted to try again.  However after she restarted it, the myalgias recur.  Therefore we have marked her chart allergic to ezetimibe. She has been able to reduce her lipids just with careful diet at this point.  She is also trying to lose weight. Her GI tract symptoms have improved after she put herself on a probiotic that she found it online. The patient has not been experiencing any significant fluid retention.  She rarely has to take Lasix.  She very rarely has to take any Xanax for her nerves.  Past Medical History  Diagnosis Date  . Palpitations IRREGULAR HEARTBEAT--  CONTROLLED W/  BETA BLOCKER  .  Hypercholesterolemia   . GERD (gastroesophageal reflux disease)   . Diverticulosis of colon     sigmoid  . H/O hiatal hernia   . Arthritis KNEES  . Acute meniscal tear of knee LEFT KNEE  . Left knee pain   . Swelling of left knee joint   . 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 Cancer [facility])     breast cancer right  . Diverticulitis of large intestine with perforation 11/02/2011    Microperforation probably related to nut and popcorn consumption.  Resolved by CT scan Recurrent episode clinical dx 07/2012     Past Surgical History  Procedure Laterality Date  . Cardiac catheterization  12-22-2000  DR Elva Mauro    NORMAL LVF/ NORMAL CORONARY ARTERIES  . Vaginal hysterectomy  1966  . Breast recontruction w/ implants  1987  . Mastectomy  1986    RIGHT BREAST W/ MULTIPLE NODE DISSECTION  . Cholecystectomy  1988  . Knee arthroscopy  2011    RIGHT KNEE  . Knee arthroscopy with medial menisectomy  09/08/2012    Procedure: KNEE ARTHROSCOPY WITH MEDIAL MENISECTOMY;  Surgeon: Tobi Bastos, MD;  Location: [place];  Service: Orthopedics;  Laterality: Left;  lateral tibial plateau,   . Chondroplasty  09/08/2012    Procedure: CHONDROPLASTY;  Surgeon: Tobi Bastos, MD;  Location: [facility];  Service: Orthopedics;  Laterality: Left;  . Total knee arthroplasty Left 07/21/2013    Procedure: TOTAL KNEE ARTHROPLASTY;  Surgeon: Newt Minion, MD;  Location: [place];  Service: Orthopedics;  Laterality: Left;  Left Total Knee Arthroplasty  . Colonoscopy      last colon 05-03-2009  . Polypectomy       Current Outpatient Prescriptions  Medication Sig Dispense Refill  . ALPRAZolam (XANAX) 0.25 MG tablet Take 0.25 mg by mouth at bedtime as needed for anxiety (additional refills need to come from PCP).    Marland Kitchen aspirin 81 MG tablet Take 81 mg by mouth 2 (two) times daily.    Marland Kitchen BIOTIN 5000 PO Take 1 capsule by mouth daily.    . Cholecalciferol (VITAMIN D) 2000 UNITS tablet  Take 2,000 Units by mouth at bedtime.    Marland Kitchen co-enzyme Q-10 30 MG capsule Take 30 mg by mouth 2 (two) times daily.     Marland Kitchen ezetimibe (ZETIA) 10 MG tablet Take 10 mg by mouth daily.    . fish oil-omega-3 fatty acids 1000 MG capsule Take 1 g by mouth 2 (two) times daily.    . furosemide (LASIX) 20 MG tablet One tablet daily by mouth as needed for swelling 30 tablet 3  . HYDROcodone-homatropine (HYCODAN) 5-1.5 MG/5ML syrup Take 5 mLs by mouth every 6 (six) hours as needed for cough. 180 mL 0  . Magnesium 400 MG CAPS Take 400 mg by mouth daily.    . metoprolol (LOPRESSOR) 50 MG tablet TAKE 1/2 TABLET BY MOUTH TWICE DAILY 90 tablet 1  . Multiple Vitamins-Minerals (CENTRUM SILVER PO) Take 1 tablet by mouth every morning.     Marland Kitchen omeprazole (PRILOSEC) 40 MG capsule Take 1 capsule (40 mg total) by mouth daily. 90 capsule 1   No current facility-administered medications for this visit.    Allergies:   Crestor; Ezetimibe; Lipitor; Pravastatin; and Penicillins    Social History:  The patient  reports that she has never smoked. She has never used smokeless tobacco. She reports that she drinks about 0.6 oz of alcohol per week. She reports that she does not use illicit drugs.   Family History:  The patient's family history includes Brain cancer in her sister; Cancer in her brother; Colon cancer in her brother; Heart attack in her father; Heart failure in her sister; Leukemia in her brother; Lung cancer in her sister.    ROS:  Please see the history of present illness.   Otherwise, review of systems are positive for none.   All other systems are reviewed and negative.    PHYSICAL EXAM: VS:  BP 140/80 mmHg  Pulse 96  Ht 5\' 2"  (1.575 m)  Wt 163 lb (73.936 kg)  BMI 29.81 kg/m2  SpO2 98% , BMI Body mass index is 29.81 kg/(m^2). GEN: Well nourished, well developed, in no acute distress HEENT: normal Neck: no JVD, carotid bruits, or masses Cardiac: RRR; there is a soft systolic flow murmur at the base.  No  diastolic murmur., rubs, or gallops,no edema  Respiratory:  clear to auscultation bilaterally, normal work of breathing GI: soft, nontender, nondistended, + BS MS: no deformity or atrophy Skin: warm and dry, no rash Neuro:  Strength and sensation are intact Psych: euthymic mood, full affect   EKG:  EKG is not ordered today.    Recent Labs: 07/21/2015: Hemoglobin 12.3; Platelets 308.0; TSH 1.01 12/22/2015: ALT 19; BUN 15; Creat 0.73; Potassium 4.3; Sodium 135    Lipid Panel    Component Value Date/Time   CHOL 194 12/22/2015 0916   TRIG 119 12/22/2015 0916   HDL  50 12/22/2015 0916   CHOLHDL 3.9 12/22/2015 0916   VLDL 24 12/22/2015 0916   LDLCALC 120 12/22/2015 0916   LDLDIRECT 111.9 07/13/2014 1254      Wt Readings from Last 3 Encounters:  12/25/15 163 lb (73.936 kg)  10/13/15 162 lb (73.483 kg)  08/07/15 157 lb (71.215 kg)         ASSESSMENT AND PLAN:  1. History of palpitations. No recent symptoms.. 2. Essential hypertension.  Controlled on current medication. 3. Hypercholesterolemia with intolerance to statins. 4. Osteoarthritis 5. Remote history of paroxysmal supraventricular tachycardia. 6. Anxiety 7.  Soft systolic ejection murmur at base consistent with aortic valve sclerosis.  No symptoms.  Current medicines are reviewed at length with the patient today. The patient does not have concerns regarding medicines.  Disposition: Continue current medication.  Recheck in 6 months for office visit and EKG with Dr. Debara Pickett.    Current medicines are reviewed at length with the patient today.  The patient does not have concerns regarding medicines.  The following changes have been made:  no change  Labs/ tests ordered today include:  No orders of the defined types were placed in this encounter.      Berna Spare MD 12/25/2015 4:36 PM    [facility] [address], [place], [place]  [address] Phone: [phone];  Fax: [phone]

## 2016-01-04 ENCOUNTER — Other Ambulatory Visit: Payer: Self-pay

## 2016-01-04 DIAGNOSIS — Z1231 Encounter for screening mammogram for malignant neoplasm of breast: Secondary | ICD-10-CM

## 2016-01-26 ENCOUNTER — Ambulatory Visit
Admission: RE | Admit: 2016-01-26 | Discharge: 2016-01-26 | Disposition: A | Discharge status: Home / Self Care | Payer: Medicare Other | Source: Ambulatory Visit

## 2016-01-26 DIAGNOSIS — Z1231 Encounter for screening mammogram for malignant neoplasm of breast: Secondary | ICD-10-CM

## 2016-01-31 ENCOUNTER — Other Ambulatory Visit: Payer: Medicare Other

## 2016-02-07 ENCOUNTER — Ambulatory Visit: Payer: Medicare Other | Admitting: Nurse Practitioner

## 2016-02-23 DIAGNOSIS — H11442 Conjunctival cysts, left eye: Secondary | ICD-10-CM | POA: Diagnosis not present

## 2016-02-23 DIAGNOSIS — H2513 Age-related nuclear cataract, bilateral: Secondary | ICD-10-CM | POA: Diagnosis not present

## 2016-02-23 DIAGNOSIS — H43813 Vitreous degeneration, bilateral: Secondary | ICD-10-CM | POA: Diagnosis not present

## 2016-03-06 DIAGNOSIS — M1711 Unilateral primary osteoarthritis, right knee: Secondary | ICD-10-CM | POA: Diagnosis not present

## 2016-04-10 DIAGNOSIS — M7062 Trochanteric bursitis, left hip: Secondary | ICD-10-CM | POA: Diagnosis not present

## 2016-04-10 DIAGNOSIS — M9903 Segmental and somatic dysfunction of lumbar region: Secondary | ICD-10-CM | POA: Diagnosis not present

## 2016-04-10 DIAGNOSIS — M6249 Contracture of muscle, multiple sites: Secondary | ICD-10-CM | POA: Diagnosis not present

## 2016-04-10 DIAGNOSIS — M5106 Intervertebral disc disorders with myelopathy, lumbar region: Secondary | ICD-10-CM | POA: Diagnosis not present

## 2016-04-30 ENCOUNTER — Other Ambulatory Visit (INDEPENDENT_AMBULATORY_CARE_PROVIDER_SITE_OTHER): Payer: Medicare Other

## 2016-04-30 ENCOUNTER — Ambulatory Visit (INDEPENDENT_AMBULATORY_CARE_PROVIDER_SITE_OTHER): Payer: Medicare Other | Admitting: Internal Medicine

## 2016-04-30 ENCOUNTER — Encounter: Payer: Self-pay | Admitting: Internal Medicine

## 2016-04-30 VITALS — BP 130/80 | HR 108 | Temp 98.9°F | Resp 20 | Wt 163.0 lb

## 2016-04-30 DIAGNOSIS — R300 Dysuria: Secondary | ICD-10-CM

## 2016-04-30 DIAGNOSIS — I471 Supraventricular tachycardia: Secondary | ICD-10-CM | POA: Diagnosis not present

## 2016-04-30 DIAGNOSIS — I10 Essential (primary) hypertension: Secondary | ICD-10-CM | POA: Diagnosis not present

## 2016-04-30 LAB — POCT URINALYSIS DIPSTICK
Bilirubin, UA: NEGATIVE
Glucose, UA: NEGATIVE
Ketones, UA: NEGATIVE
Nitrite, UA: NEGATIVE
Protein, UA: NEGATIVE
Spec Grav, UA: 1.025
Urobilinogen, UA: NEGATIVE
pH, UA: 6.5

## 2016-04-30 LAB — URINALYSIS, ROUTINE W REFLEX MICROSCOPIC
BILIRUBIN URINE: NEGATIVE
HGB URINE DIPSTICK: NEGATIVE
Ketones, ur: NEGATIVE
Leukocytes, UA: NEGATIVE
NITRITE: NEGATIVE
PH: 6 (ref 5.0–8.0)
Specific Gravity, Urine: <=1.005 — AB (ref 1.000–1.030)
TOTAL PROTEIN, URINE-UPE24: NEGATIVE
URINE GLUCOSE: NEGATIVE
UROBILINOGEN UA: 0.2 (ref 0.0–1.0)

## 2016-04-30 MED ORDER — NITROFURANTOIN MONOHYD MACRO 100 MG PO CAPS: 100.0000 mg | ORAL_CAPSULE | Freq: Two times a day (BID) | ORAL | Status: DC

## 2016-04-30 MED ORDER — PHENAZOPYRIDINE HCL 100 MG PO TABS: 100.0000 mg | ORAL_TABLET | Freq: Three times a day (TID) | ORAL | Status: DC | PRN

## 2016-04-30 MED ORDER — METOPROLOL TARTRATE 50 MG PO TABS
ORAL_TABLET | ORAL | Status: DC
Start: 2016-04-30 — End: 2017-07-10

## 2016-04-30 NOTE — Patient Instructions (Addendum)
Please take all new medication as prescribed - the antibiotic  Your specimen will be sent for culture, to make the antibiotic should be effective  Your EKG was OK today, but did show several extra beats  OK to increase the Metoprolol to 50 mg twice per day  Please continue all other medications as before, and refills have been done if requested.  Please have the pharmacy call with any other refills you may need.  Please keep your appointments with your specialists as you may have planned - cardiology on Aug 21

## 2016-04-30 NOTE — Progress Notes (Signed)
Subjective:    Patient ID: Leslie Bryant, female    DOB: [birth year], 75 y.o.   MRN: [id number]  HPI  Here with 3 days onset dysuria with some freq but Denies urinary symptoms such as urgency, flank pain, hematuria or n/v, fever, chills.  Last UTi > 6 mo.  Pt denies chest pain, increased sob or doe, wheezing, orthopnea, PND, increased LE swelling, dizziness or syncope., but has intermittent palpitations in the past wk as well.  Pt denies new neurological symptoms such as new headache, or facial or extremity weakness or numbness   Pt denies polydipsia, polyuria.   Past Medical History  Diagnosis Date  . Palpitations IRREGULAR HEARTBEAT--  CONTROLLED W/  BETA BLOCKER  . Hypercholesterolemia   . GERD (gastroesophageal reflux disease)   . Diverticulosis of colon     sigmoid  . H/O hiatal hernia   . Arthritis KNEES  . Acute meniscal tear of knee LEFT KNEE  . Left knee pain   . Swelling of left knee joint   . 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 Cancer [facility])     breast cancer right  . Diverticulitis of large intestine with perforation 11/02/2011    Microperforation probably related to nut and popcorn consumption.  Resolved by CT scan Recurrent episode clinical dx 07/2012    Past Surgical History  Procedure Laterality Date  . Cardiac catheterization  12-22-2000  DR BRACKBILL    NORMAL LVF/ NORMAL CORONARY ARTERIES  . Vaginal hysterectomy  1966  . Breast recontruction w/ implants  1987  . Mastectomy  1986    RIGHT BREAST W/ MULTIPLE NODE DISSECTION  . Cholecystectomy  1988  . Knee arthroscopy  2011    RIGHT KNEE  . Knee arthroscopy with medial menisectomy  09/08/2012    Procedure: KNEE ARTHROSCOPY WITH MEDIAL MENISECTOMY;  Surgeon: Tobi Bastos, MD;  Location: [place];  Service: Orthopedics;  Laterality: Left;  lateral tibial plateau,   . Chondroplasty  09/08/2012    Procedure: CHONDROPLASTY;  Surgeon: Tobi Bastos, MD;  Location: [facility];   Service: Orthopedics;  Laterality: Left;  . Total knee arthroplasty Left 07/21/2013    Procedure: TOTAL KNEE ARTHROPLASTY;  Surgeon: Newt Minion, MD;  Location: [place];  Service: Orthopedics;  Laterality: Left;  Left Total Knee Arthroplasty  . Colonoscopy      last colon 05-03-2009  . Polypectomy      reports that she has never smoked. She has never used smokeless tobacco. She reports that she drinks about 0.6 oz of alcohol per week. She reports that she does not use illicit drugs. family history includes Brain cancer in her sister; Cancer in her brother; Colon cancer in her brother; Heart attack in her father; Heart failure in her sister; Leukemia in her brother; Lung cancer in her sister. Allergies  Allergen Reactions  . Crestor [Rosuvastatin Calcium] Other (See Comments)    Shoulder pain  . Ezetimibe     Leg muscle weakness  . Lipitor [Atorvastatin Calcium] Other (See Comments)    Myalgias   . Pravastatin Other (See Comments)    myalgias  . Penicillins Itching   Current Outpatient Prescriptions on File Prior to Visit  Medication Sig Dispense Refill  . ALPRAZolam (XANAX) 0.25 MG tablet Take 0.25 mg by mouth at bedtime as needed for anxiety (additional refills need to come from PCP).    Marland Kitchen aspirin 81 MG tablet Take 81 mg  by mouth 2 (two) times daily.    Marland Kitchen BIOTIN 5000 PO Take 1 capsule by mouth daily.    . Cholecalciferol (VITAMIN D) 2000 UNITS tablet Take 2,000 Units by mouth at bedtime.    Marland Kitchen co-enzyme Q-10 30 MG capsule Take 30 mg by mouth 2 (two) times daily.     Marland Kitchen ezetimibe (ZETIA) 10 MG tablet Take 10 mg by mouth daily.    . fish oil-omega-3 fatty acids 1000 MG capsule Take 1 g by mouth 2 (two) times daily.    . furosemide (LASIX) 20 MG tablet One tablet daily by mouth as needed for swelling 30 tablet 3  . Magnesium 400 MG CAPS Take 400 mg by mouth daily.    . metoprolol (LOPRESSOR) 50 MG tablet TAKE 1/2 TABLET BY MOUTH TWICE DAILY 90 tablet 1  . Multiple Vitamins-Minerals  (CENTRUM SILVER PO) Take 1 tablet by mouth every morning.     Marland Kitchen omeprazole (PRILOSEC) 40 MG capsule Take 1 capsule (40 mg total) by mouth daily. 90 capsule 1   No current facility-administered medications on file prior to visit.   Review of Systems  Constitutional: Negative for unusual diaphoresis or night sweats HENT: Negative for ear swelling or discharge Eyes: Negative for worsening visual haziness  Respiratory: Negative for choking and stridor.   Gastrointestinal: Negative for distension or worsening eructation Genitourinary: Negative for retention or change in urine volume.  Musculoskeletal: Negative for other MSK pain or swelling Skin: Negative for color change and worsening wound Neurological: Negative for tremors and numbness other than noted  Psychiatric/Behavioral: Negative for decreased concentration or agitation other than above       Objective:   Physical Exam BP 130/80 mmHg  Pulse 108  Temp(Src) 98.9 F (37.2 C) (Oral)  Resp 20  Wt 163 lb (73.936 kg)  SpO2 92% VS noted, mild uncomfortable but nontoxic Constitutional: Pt appears in no apparent distress HENT: Head: NCAT.  Right Ear: External ear normal.  Left Ear: External ear normal.  Eyes: . Pupils are equal, round, and reactive to light. Conjunctivae and EOM are normal Neck: Normal range of motion. Neck supple.  Cardiovascular: Normal rate and regular rhythm with probable significant ectopy vs irreg irreg Pulmonary/Chest: Effort normal and breath sounds without rales or wheezing.  Abd:  Soft, ND, + BS with mild low mid abd tender, no guarding or rebound Neurological: Pt is alert. Not confused , motor grossly intact Skin: Skin is warm. No rash, no LE edema Psychiatric: Pt behavior is normal. No agitation.   POCT Urinalysis Dipstick  Status: Finalresult Visible to patient:  Not Released Dx:  Dysuria              Ref Range 3:07 PM  59mo ago  52yr ago  79yr ago     Color, UA  yellow        Clarity, UA  cloudy       Glucose, UA  negative       Bilirubin, UA  negative       Ketones, UA  negative       Spec Grav, UA  1.025       Blood, UA  small       pH, UA  6.5       Protein, UA  negative       Urobilinogen, UA  negative 0.2 0.2 0.2    Nitrite, UA  negative       Leukocytes, UA Negative  small (1+) (A) NEGATIVE  NEGATIVE LARGE   Resulting Agency             ECG today Sinus  Rhythm  - occasional ectopic ventricular beat    -Old inferior infarct.   ABNORMAL      Assessment & Plan:

## 2016-04-30 NOTE — Assessment & Plan Note (Signed)
stable overall by history and exam, recent data reviewed with pt, and pt to continue medical treatment as before,  to f/u any worsening symptoms or concerns ble BP Readings from Last 3 Encounters:  04/30/16 130/80  12/25/15 140/80  10/13/15 126/74

## 2016-04-30 NOTE — Progress Notes (Signed)
Pre visit review using our clinic review tool, if applicable. No additional management support is needed unless otherwise documented below in the visit note. 

## 2016-04-30 NOTE — Assessment & Plan Note (Signed)
With freq PAC, ok to increase the BB to 50 bid metoprolol, with f/u with Dr Debara Pickett,  to f/u any worsening symptoms or concerns

## 2016-04-30 NOTE — Assessment & Plan Note (Signed)
Mild to mod, better at one point with pyridium she had leftover, but now recurrent worsening for several days, no fever but uncomfortable lower abd pain, for urine studies, for antibx course,  to f/u any worsening symptoms or concerns

## 2016-05-02 LAB — CULTURE, URINE COMPREHENSIVE
COLONY COUNT: NO GROWTH
ORGANISM ID, BACTERIA: NO GROWTH

## 2016-05-25 ENCOUNTER — Emergency Department (HOSPITAL_BASED_OUTPATIENT_CLINIC_OR_DEPARTMENT_OTHER)
Admission: EM | Admit: 2016-05-25 | Discharge: 2016-05-25 | Disposition: A | Discharge status: Home / Self Care | Payer: Medicare Other | Attending: Emergency Medicine | Admitting: Emergency Medicine

## 2016-05-25 ENCOUNTER — Emergency Department (HOSPITAL_BASED_OUTPATIENT_CLINIC_OR_DEPARTMENT_OTHER): Payer: Medicare Other

## 2016-05-25 ENCOUNTER — Encounter (HOSPITAL_BASED_OUTPATIENT_CLINIC_OR_DEPARTMENT_OTHER): Payer: Self-pay | Admitting: *Deleted

## 2016-05-25 DIAGNOSIS — Z853 Personal history of malignant neoplasm of breast: Secondary | ICD-10-CM | POA: Diagnosis not present

## 2016-05-25 DIAGNOSIS — Z79899 Other long term (current) drug therapy: Secondary | ICD-10-CM | POA: Diagnosis not present

## 2016-05-25 DIAGNOSIS — Z7982 Long term (current) use of aspirin: Secondary | ICD-10-CM | POA: Diagnosis not present

## 2016-05-25 DIAGNOSIS — R002 Palpitations: Secondary | ICD-10-CM

## 2016-05-25 DIAGNOSIS — R0602 Shortness of breath: Secondary | ICD-10-CM | POA: Diagnosis not present

## 2016-05-25 LAB — BASIC METABOLIC PANEL
ANION GAP: 8 (ref 5–15)
BUN: 20 mg/dL (ref 6–20)
CO2: 26 mmol/L (ref 22–32)
CREATININE: 1.01 mg/dL — AB (ref 0.44–1.00)
Calcium: 9.5 mg/dL (ref 8.9–10.3)
Chloride: 97 mmol/L — ABNORMAL LOW (ref 101–111)
GFR calc non Af Amer: 53 mL/min — ABNORMAL LOW (ref >60)
GLUCOSE: 125 mg/dL — AB (ref 65–99)
POTASSIUM: 4 mmol/L (ref 3.5–5.1)
Sodium: 131 mmol/L — ABNORMAL LOW (ref 135–145)

## 2016-05-25 LAB — CBC
HEMATOCRIT: 35.9 % — AB (ref 36.0–46.0)
Hemoglobin: 12.1 g/dL (ref 12.0–15.0)
MCH: 30.9 pg (ref 26.0–34.0)
MCHC: 33.7 g/dL (ref 30.0–36.0)
MCV: 91.8 fL (ref 78.0–100.0)
PLATELETS: 298 10*3/uL (ref 150–400)
RBC: 3.91 MIL/uL (ref 3.87–5.11)
RDW: 11.9 % (ref 11.5–15.5)
WBC: 7.2 10*3/uL (ref 4.0–10.5)

## 2016-05-25 LAB — TROPONIN I: Troponin I: <0.03 ng/mL (ref <0.03)

## 2016-05-25 NOTE — ED Notes (Signed)
MD at bedside. 

## 2016-05-25 NOTE — ED Notes (Signed)
Pt c/o palpitations and difficulty taking a deep breath 1 hour ago. Denies chest pain, N/V.

## 2016-05-25 NOTE — ED Notes (Addendum)
Per pt report a sudden funny feeling in chest this morning with SOB, no other complaints,  reports that PCP increased lopressor last month and has been feeling fatigue.

## 2016-05-25 NOTE — Discharge Instructions (Signed)

## 2016-05-25 NOTE — ED Provider Notes (Signed)
CSN: [id number]     Arrival date & time 05/25/16  1052 History   First MD Initiated Contact with Patient 05/25/16 1107     Chief Complaint  Patient presents with  . Shortness of Breath  . Chest Pain      HPI patient presents with palpitations and some shortness of breath.  This is been bothering her for quite some time.  Her family physician increased her metoprolol from 50 mg to 100 mg a day but it is not [place] problem.  She denies any chest pain.  Denies diaphoresis or nausea.  She is not worn a Holter monitor for several years.  She has a past medical history of SVT. Patient does state that she drinks 2 cups of caffeinated coffee every morning.  Past Medical History  Diagnosis Date  . Palpitations IRREGULAR HEARTBEAT--  CONTROLLED W/  BETA BLOCKER  . Hypercholesterolemia   . GERD (gastroesophageal reflux disease)   . Diverticulosis of colon     sigmoid  . H/O hiatal hernia   . Arthritis KNEES  . Acute meniscal tear of knee LEFT KNEE  . Left knee pain   . Swelling of left knee joint   . 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 Cancer [facility])     breast cancer right  . Diverticulitis of large intestine with perforation 11/02/2011    Microperforation probably related to nut and popcorn consumption.  Resolved by CT scan Recurrent episode clinical dx 07/2012    Past Surgical History  Procedure Laterality Date  . Cardiac catheterization  12-22-2000  DR BRACKBILL    NORMAL LVF/ NORMAL CORONARY ARTERIES  . Vaginal hysterectomy  1966  . Breast recontruction w/ implants  1987  . Mastectomy  1986    RIGHT BREAST W/ MULTIPLE NODE DISSECTION  . Cholecystectomy  1988  . Knee arthroscopy  2011    RIGHT KNEE  . Knee arthroscopy with medial menisectomy  09/08/2012    Procedure: KNEE ARTHROSCOPY WITH MEDIAL MENISECTOMY;  Surgeon: Tobi Bastos, MD;  Location: [place];  Service: Orthopedics;  Laterality: Left;  lateral tibial plateau,   . Chondroplasty  09/08/2012   Procedure: CHONDROPLASTY;  Surgeon: Tobi Bastos, MD;  Location: [facility];  Service: Orthopedics;  Laterality: Left;  . Total knee arthroplasty Left 07/21/2013    Procedure: TOTAL KNEE ARTHROPLASTY;  Surgeon: Newt Minion, MD;  Location: [place];  Service: Orthopedics;  Laterality: Left;  Left Total Knee Arthroplasty  . Colonoscopy      last colon 05-03-2009  . Polypectomy     Family History  Problem Relation Age of Onset  . Heart attack Father   . Heart failure Sister   . Colon cancer Brother     died/age 40  . Lung cancer Sister   . Brain cancer Sister   . Cancer Brother     mouth  . Leukemia Brother    Social History  Substance Use Topics  . Smoking status: Never Smoker   . Smokeless tobacco: Never Used  . Alcohol Use: 0.6 oz/week    1 Glasses of wine per week     Comment: occ   OB History    No data available     Review of Systems  All other systems reviewed and are negative  Allergies  Crestor; Ezetimibe; Lipitor; Pravastatin; and Penicillins  Home Medications   Prior to Admission medications   Medication Sig Start Date End Date  Taking? Authorizing Provider  ALPRAZolam Duanne Moron) 0.25 MG tablet Take 0.25 mg by mouth at bedtime as needed for anxiety (additional refills need to come from PCP).   Yes Historical Provider, MD  aspirin 81 MG tablet Take 81 mg by mouth 2 (two) times daily.   Yes Historical Provider, MD  BIOTIN 5000 PO Take 1 capsule by mouth daily.   Yes Historical Provider, MD  Cholecalciferol (VITAMIN D) 2000 UNITS tablet Take 2,000 Units by mouth at bedtime.   Yes Historical Provider, MD  co-enzyme Q-10 30 MG capsule Take 30 mg by mouth 2 (two) times daily.    Yes Historical Provider, MD  ezetimibe (ZETIA) 10 MG tablet Take 10 mg by mouth daily.   Yes Historical Provider, MD  fish oil-omega-3 fatty acids 1000 MG capsule Take 1 g by mouth 2 (two) times daily.   Yes Historical Provider, MD  furosemide (LASIX) 20 MG tablet One tablet  daily by mouth as needed for swelling 02/21/15  Yes Darlin Coco, MD  Magnesium 400 MG CAPS Take 400 mg by mouth daily.   Yes Historical Provider, MD  Multiple Vitamins-Minerals (CENTRUM SILVER PO) Take 1 tablet by mouth every morning.    Yes Historical Provider, MD  nitrofurantoin, macrocrystal-monohydrate, (MACROBID) 100 MG capsule Take 1 capsule (100 mg total) by mouth 2 (two) times daily. 04/30/16  Yes Biagio Borg, MD  omeprazole (PRILOSEC) 40 MG capsule Take 1 capsule (40 mg total) by mouth daily. 12/14/15  Yes Darlin Coco, MD  metoprolol (LOPRESSOR) 50 MG tablet TAKE 1 TABLET BY MOUTH TWICE DAILY Patient taking differently: 100 mg. TAKE 1 TABLET BY MOUTH TWICE DAILY 04/30/16   Biagio Borg, MD   BP 142/83 mmHg  Pulse 80  Temp(Src) 98.3 F (36.8 C) (Oral)  Resp 20  SpO2 99% Physical Exam Physical Exam  Nursing note and vitals reviewed. Constitutional: She is oriented to person, place, and time. She appears well-developed and well-nourished. No distress.  HENT:  Head: Normocephalic and atraumatic.  Eyes: Pupils are equal, round, and reactive to light.  Neck: Normal range of motion.  Cardiovascular: Normal rate and intact distal pulses.   Pulmonary/Chest: No respiratory distress.  Abdominal: Normal appearance. She exhibits no distension.  Musculoskeletal: Normal range of motion.  Neurological: She is alert and oriented to person, place, and time. No cranial nerve deficit.  Skin: Skin is warm and dry. No rash noted.  Psychiatric: She has a normal mood and affect. Her behavior is normal.   ED Course  Procedures (including critical care time) Labs Review Labs Reviewed  BASIC METABOLIC PANEL - Abnormal; Notable for the following:    Sodium 131 (*)    Chloride 97 (*)    Glucose, Bld 125 (*)    Creatinine, Ser 1.01 (*)    GFR calc non Af Amer 53 (*)    All other components within normal limits  CBC - Abnormal; Notable for the following:    HCT 35.9 (*)    All other  components within normal limits  TROPONIN I    Imaging Review Dg Chest 2 View  05/25/2016  CLINICAL DATA:  Palpitations and shortness of breath. EXAM: CHEST  2 VIEW COMPARISON:  07/13/2013 FINDINGS: The heart size and mediastinal contours are within normal limits. There is no evidence of pulmonary edema, consolidation, pneumothorax, nodule or pleural fluid. The visualized skeletal structures are unremarkable. IMPRESSION: No active cardiopulmonary disease. Electronically Signed   By: Aletta Edouard M.D.   On: 05/25/2016 11:54  I have personally reviewed and evaluated these images and lab results as part of my medical decision-making.   EKG Interpretation   Date/Time:  Saturday May 25 2016 10:59:25 EDT Ventricular Rate:  78 PR Interval:  196 QRS Duration: 70 QT Interval:  372 QTC Calculation: 424 R Axis:   16 Text Interpretation:  Normal sinus rhythm Low voltage QRS Borderline ECG  Confirmed by Marten Iles  MD, Senay Sistrunk ([id number]) on 05/25/2016 11:15:57 AM     Patient did have some unifocal PVCs on her monitor.  She is taking 100 mg metoprolol daily.  Think the next step for her is to stop her caffeine intake.  She is going to gradually switch over to decaffeinated coffee over the next week.  If that doesn't clear things up she is to follow-up with her primary care doctor.  She is also to reduce chocolate intake. MDM   Final diagnoses:  Palpitation        Leonard Schwartz, MD 05/25/16 1234

## 2016-06-24 ENCOUNTER — Ambulatory Visit (INDEPENDENT_AMBULATORY_CARE_PROVIDER_SITE_OTHER): Payer: Medicare Other | Admitting: Internal Medicine

## 2016-06-24 ENCOUNTER — Encounter: Payer: Self-pay | Admitting: Internal Medicine

## 2016-06-24 VITALS — BP 128/66 | Ht 62.0 in | Wt 162.0 lb

## 2016-06-24 DIAGNOSIS — E785 Hyperlipidemia, unspecified: Secondary | ICD-10-CM | POA: Diagnosis not present

## 2016-06-24 DIAGNOSIS — I10 Essential (primary) hypertension: Secondary | ICD-10-CM

## 2016-06-24 DIAGNOSIS — R002 Palpitations: Secondary | ICD-10-CM | POA: Diagnosis not present

## 2016-06-24 MED ORDER — EZETIMIBE 10 MG PO TABS: 10.0000 mg | ORAL_TABLET | Freq: Every day | ORAL | 12 refills | Status: DC

## 2016-06-24 NOTE — Patient Instructions (Signed)
Medication Instructions:   RESTART ZETIA 10 MG ONCE DAILY  Labwork:  Your physician recommends that you return for lab work in: [place]:  Your physician wants you to follow-up in: [place] will receive a reminder letter in the mail two months in advance. If you don't receive a letter, please call our office to schedule the follow-up appointment.

## 2016-06-24 NOTE — Progress Notes (Signed)
OFFICE NOTE  Chief Complaint:  Routine follow-up, recent ER visit  Primary Care Physician: [name], MD  HPI:  Leslie Bryant is a 75 y.o. female who is a former patient of Dr. Mare Ferrari. She is followed along with him for a number years and he also followed her husband who unfortunately died. She has no known coronary artery disease. She had heart catheterization 2002 which showed essentially normal coronaries. She's been followed for palpitations, history of PSVT which is been well controlled and dyslipidemia. Recently she was seen in the emergency department after working strenuously in the yard on a very hot day. She felt some fluttering in her chest and was noted to have some unifocal PVCs on a monitor. Recently her metoprolol was increased to 50 mg twice a day. She was advised to decrease caffeine intake. Since that time she's had no further recurrence and felt like it may be related to working outside in the hot weather. Blood pressure is well-controlled today. She has a history of intolerance to statins. Recently she had had some pain in her legs and thought it was related to study of. She was taken off that medicine and listed as an allergy however her symptoms did not change therefore she is interested in going back on the medicine. While she was on Zetia her LDL was 120 which is reasonable control.   PMHx:  Past Medical History:  Diagnosis Date  . Acute meniscal tear of knee LEFT KNEE  . Arthritis KNEES  . 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 Cancer [facility])    breast cancer right  . Diverticulitis of large intestine with perforation 11/02/2011   Microperforation probably related to nut and popcorn consumption.  Resolved by CT scan Recurrent episode clinical dx 07/2012   . Diverticulosis of colon    sigmoid  . GERD (gastroesophageal reflux disease)   . H/O hiatal hernia   . Hypercholesterolemia   . Left knee pain   . Palpitations IRREGULAR HEARTBEAT--  CONTROLLED W/  BETA  BLOCKER  . Swelling of left knee joint     Past Surgical History:  Procedure Laterality Date  . BREAST RECONTRUCTION W/ IMPLANTS  1987  . CARDIAC CATHETERIZATION  12-22-2000  DR BRACKBILL   NORMAL LVF/ NORMAL CORONARY ARTERIES  . CHOLECYSTECTOMY  1988  . CHONDROPLASTY  09/08/2012   Procedure: CHONDROPLASTY;  Surgeon: Tobi Bastos, MD;  Location: [facility];  Service: Orthopedics;  Laterality: Left;  . COLONOSCOPY     last colon 05-03-2009  . KNEE ARTHROSCOPY  2011   RIGHT KNEE  . KNEE ARTHROSCOPY WITH MEDIAL MENISECTOMY  09/08/2012   Procedure: KNEE ARTHROSCOPY WITH MEDIAL MENISECTOMY;  Surgeon: Tobi Bastos, MD;  Location: [place];  Service: Orthopedics;  Laterality: Left;  lateral tibial plateau,   . MASTECTOMY  1986   RIGHT BREAST W/ MULTIPLE NODE DISSECTION  . POLYPECTOMY    . TOTAL KNEE ARTHROPLASTY Left 07/21/2013   Procedure: TOTAL KNEE ARTHROPLASTY;  Surgeon: Newt Minion, MD;  Location: [place];  Service: Orthopedics;  Laterality: Left;  Left Total Knee Arthroplasty  . VAGINAL HYSTERECTOMY  1966    FAMHx:  Family History  Problem Relation Age of Onset  . Heart attack Father   . Heart failure Sister   . Colon cancer Brother     died/age 25  . Lung cancer Sister   . Brain cancer Sister   . Cancer Brother  mouth  . Leukemia Brother     SOCHx:   reports that she has never smoked. She has never used smokeless tobacco. She reports that she drinks about 0.6 oz of alcohol per week . She reports that she does not use drugs.  ALLERGIES:  Allergies  Allergen Reactions  . Crestor [Rosuvastatin Calcium] Other (See Comments)    Shoulder pain  . Ezetimibe     Leg muscle weakness  . Lipitor [Atorvastatin Calcium] Other (See Comments)    Myalgias   . Pravastatin Other (See Comments)    myalgias  . Penicillins Itching    ROS: Pertinent items noted in HPI and remainder of comprehensive ROS otherwise negative.  HOME  MEDS: Current Outpatient Prescriptions  Medication Sig Dispense Refill  . ALPRAZolam (XANAX) 0.25 MG tablet Take 0.25 mg by mouth at bedtime as needed for anxiety (additional refills need to come from PCP).    Marland Kitchen aspirin 81 MG tablet Take 81 mg by mouth 2 (two) times daily.    Marland Kitchen BIOTIN 5000 PO Take 1 capsule by mouth daily.    . Cholecalciferol (VITAMIN D) 2000 UNITS tablet Take 2,000 Units by mouth at bedtime.    Marland Kitchen co-enzyme Q-10 30 MG capsule Take 30 mg by mouth 2 (two) times daily.     Marland Kitchen ezetimibe (ZETIA) 10 MG tablet Take 10 mg by mouth daily.    . fish oil-omega-3 fatty acids 1000 MG capsule Take 1 g by mouth 2 (two) times daily.    . furosemide (LASIX) 20 MG tablet One tablet daily by mouth as needed for swelling 30 tablet 3  . Magnesium 400 MG CAPS Take 400 mg by mouth daily.    . metoprolol (LOPRESSOR) 50 MG tablet TAKE 1 TABLET BY MOUTH TWICE DAILY 180 tablet 3  . Multiple Vitamins-Minerals (CENTRUM SILVER PO) Take 1 tablet by mouth every morning.     Marland Kitchen omeprazole (PRILOSEC) 40 MG capsule Take 1 capsule (40 mg total) by mouth daily. 90 capsule 1   No current facility-administered medications for this visit.     LABS/IMAGING: No results found for this or any previous visit (from the past 48 hour(s)). No results found.  WEIGHTS: Wt Readings from Last 3 Encounters:  06/24/16 162 lb (73.5 kg)  04/30/16 163 lb (73.9 kg)  12/25/15 163 lb (73.9 kg)    VITALS: BP 128/66 (BP Location: Left Arm, Patient Position: Sitting, Cuff Size: Normal)   Ht 5\' 2"  (1.575 m)   Wt 162 lb (73.5 kg)   BMI 29.63 kg/m   EXAM: General appearance: alert and no distress Neck: no carotid bruit and no JVD Lungs: clear to auscultation bilaterally Heart: regular rate and rhythm and systolic murmur: early systolic 2/6, crescendo at 2nd right intercostal space Abdomen: soft, non-tender; bowel sounds normal; no masses,  no organomegaly Extremities: extremities normal, atraumatic, no cyanosis or  edema Pulses: 2+ and symmetric Skin: Skin color, texture, turgor normal. No rashes or lesions Neurologic: Grossly normal Psych: Pleasant  EKG: Sinus rhythm with occasional PVCs at 79  ASSESSMENT: 1. Palpitations-history of PSVT and occasional PVCs 2. Dyslipidemia-intolerant to statins 3. Essential hypertension  PLAN: 1.   Leslie Bryant has a history of PSVT and occasional PVCs. She was taking her metoprolol once daily and I was increased up to twice daily due to the short acting nature of the medicine. Blood pressure is well-controlled and her palpitations are rare. She is not aware of PVCs which she's been having both in the ER  and seen on the EKG today. She denies any shortness of breath or chest pain. Unfortunate she's been intolerant to statins but was able to take Zetia. This was stopped briefly due to leg pain which was not associated with the medicine and I believe we should restart it today. Blood pressure is at goal. Plan to see her back in 6 months after recheck a lipid and liver profile.  Pixie Casino, MD, [facility] Attending Cardiologist [place] [facility] 06/24/2016, 8:47 AM

## 2016-07-01 DIAGNOSIS — M9903 Segmental and somatic dysfunction of lumbar region: Secondary | ICD-10-CM | POA: Diagnosis not present

## 2016-07-01 DIAGNOSIS — M6249 Contracture of muscle, multiple sites: Secondary | ICD-10-CM | POA: Diagnosis not present

## 2016-07-01 DIAGNOSIS — M7062 Trochanteric bursitis, left hip: Secondary | ICD-10-CM | POA: Diagnosis not present

## 2016-07-01 DIAGNOSIS — M5106 Intervertebral disc disorders with myelopathy, lumbar region: Secondary | ICD-10-CM | POA: Diagnosis not present

## 2016-07-15 DIAGNOSIS — M9903 Segmental and somatic dysfunction of lumbar region: Secondary | ICD-10-CM | POA: Diagnosis not present

## 2016-07-15 DIAGNOSIS — M7062 Trochanteric bursitis, left hip: Secondary | ICD-10-CM | POA: Diagnosis not present

## 2016-07-15 DIAGNOSIS — M5414 Radiculopathy, thoracic region: Secondary | ICD-10-CM | POA: Diagnosis not present

## 2016-07-15 DIAGNOSIS — M6249 Contracture of muscle, multiple sites: Secondary | ICD-10-CM | POA: Diagnosis not present

## 2016-07-15 DIAGNOSIS — M5106 Intervertebral disc disorders with myelopathy, lumbar region: Secondary | ICD-10-CM | POA: Diagnosis not present

## 2016-07-15 DIAGNOSIS — M9902 Segmental and somatic dysfunction of thoracic region: Secondary | ICD-10-CM | POA: Diagnosis not present

## 2016-07-18 DIAGNOSIS — M5106 Intervertebral disc disorders with myelopathy, lumbar region: Secondary | ICD-10-CM | POA: Diagnosis not present

## 2016-07-18 DIAGNOSIS — M7062 Trochanteric bursitis, left hip: Secondary | ICD-10-CM | POA: Diagnosis not present

## 2016-07-18 DIAGNOSIS — M9903 Segmental and somatic dysfunction of lumbar region: Secondary | ICD-10-CM | POA: Diagnosis not present

## 2016-07-18 DIAGNOSIS — M6249 Contracture of muscle, multiple sites: Secondary | ICD-10-CM | POA: Diagnosis not present

## 2016-07-18 DIAGNOSIS — M9902 Segmental and somatic dysfunction of thoracic region: Secondary | ICD-10-CM | POA: Diagnosis not present

## 2016-07-18 DIAGNOSIS — M5414 Radiculopathy, thoracic region: Secondary | ICD-10-CM | POA: Diagnosis not present

## 2016-07-25 ENCOUNTER — Ambulatory Visit (INDEPENDENT_AMBULATORY_CARE_PROVIDER_SITE_OTHER): Payer: Medicare Other | Admitting: Internal Medicine

## 2016-07-25 VITALS — BP 120/76 | HR 100 | Temp 98.7°F | Resp 20 | Wt 169.0 lb

## 2016-07-25 DIAGNOSIS — I10 Essential (primary) hypertension: Secondary | ICD-10-CM

## 2016-07-25 DIAGNOSIS — J309 Allergic rhinitis, unspecified: Secondary | ICD-10-CM

## 2016-07-25 DIAGNOSIS — J019 Acute sinusitis, unspecified: Secondary | ICD-10-CM

## 2016-07-25 DIAGNOSIS — Z23 Encounter for immunization: Secondary | ICD-10-CM | POA: Diagnosis not present

## 2016-07-25 DIAGNOSIS — F411 Generalized anxiety disorder: Secondary | ICD-10-CM | POA: Diagnosis not present

## 2016-07-25 MED ORDER — HYDROCODONE-HOMATROPINE 5-1.5 MG/5ML PO SYRP: 5.0000 mL | ORAL_SOLUTION | Freq: Four times a day (QID) | ORAL | 0 refills | Status: DC | PRN

## 2016-07-25 MED ORDER — AZITHROMYCIN 250 MG PO TABS: ORAL_TABLET | ORAL | 1 refills | Status: DC

## 2016-07-25 MED ORDER — HYDROCODONE-HOMATROPINE 5-1.5 MG/5ML PO SYRP: 5.0000 mL | ORAL_SOLUTION | Freq: Four times a day (QID) | ORAL | 0 refills | Status: AC | PRN

## 2016-07-25 NOTE — Progress Notes (Signed)
Subjective:    Patient ID: Leslie Bryant, female    DOB: 03/16/1941, 75 y.o.   MRN: [id number]  HPI   Here with 2-3 days acute onset fever, facial pain, pressure, headache, general weakness and malaise, and greenish d/c, with mild ST and cough, but pt denies chest pain, wheezing, increased sob or doe, orthopnea, PND, increased LE swelling, palpitations, dizziness or syncope. Does have several wks ongoing nasal allergy symptoms with clearish congestion, itch and sneezing, without fever, pain, ST, cough, swelling or wheezing.  Denies worsening depressive symptoms, suicidal ideation, or panic.   Past Medical History:  Diagnosis Date  . Acute meniscal tear of knee LEFT KNEE  . Arthritis KNEES  . 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 Cancer [facility])    breast cancer right  . Diverticulitis of large intestine with perforation 11/02/2011   Microperforation probably related to nut and popcorn consumption.  Resolved by CT scan Recurrent episode clinical dx 07/2012   . Diverticulosis of colon    sigmoid  . GERD (gastroesophageal reflux disease)   . H/O hiatal hernia   . Hypercholesterolemia   . Left knee pain   . Palpitations IRREGULAR HEARTBEAT--  CONTROLLED W/  BETA BLOCKER  . Swelling of left knee joint    Past Surgical History:  Procedure Laterality Date  . BREAST RECONTRUCTION W/ IMPLANTS  1987  . CARDIAC CATHETERIZATION  12-22-2000  DR BRACKBILL   NORMAL LVF/ NORMAL CORONARY ARTERIES  . CHOLECYSTECTOMY  1988  . CHONDROPLASTY  09/08/2012   Procedure: CHONDROPLASTY;  Surgeon: Tobi Bastos, MD;  Location: [facility];  Service: Orthopedics;  Laterality: Left;  . COLONOSCOPY     last colon 05-03-2009  . KNEE ARTHROSCOPY  2011   RIGHT KNEE  . KNEE ARTHROSCOPY WITH MEDIAL MENISECTOMY  09/08/2012   Procedure: KNEE ARTHROSCOPY WITH MEDIAL MENISECTOMY;  Surgeon: Tobi Bastos, MD;  Location: [place];  Service: Orthopedics;  Laterality: Left;  lateral tibial  plateau,   . MASTECTOMY  1986   RIGHT BREAST W/ MULTIPLE NODE DISSECTION  . POLYPECTOMY    . TOTAL KNEE ARTHROPLASTY Left 07/21/2013   Procedure: TOTAL KNEE ARTHROPLASTY;  Surgeon: Newt Minion, MD;  Location: [place];  Service: Orthopedics;  Laterality: Left;  Left Total Knee Arthroplasty  . VAGINAL HYSTERECTOMY  1966    reports that she has never smoked. She has never used smokeless tobacco. She reports that she drinks about 0.6 oz of alcohol per week . She reports that she does not use drugs. family history includes Brain cancer in her sister; Cancer in her brother; Colon cancer in her brother; Heart attack in her father; Heart failure in her sister; Leukemia in her brother; Lung cancer in her sister. Allergies  Allergen Reactions  . Crestor [Rosuvastatin Calcium] Other (See Comments)    Shoulder pain  . Lipitor [Atorvastatin Calcium] Other (See Comments)    Myalgias   . Pravastatin Other (See Comments)    myalgias  . Penicillins Itching   Current Outpatient Prescriptions on File Prior to Visit  Medication Sig Dispense Refill  . ALPRAZolam (XANAX) 0.25 MG tablet Take 0.25 mg by mouth at bedtime as needed for anxiety (additional refills need to come from PCP).    Marland Kitchen aspirin 81 MG tablet Take 81 mg by mouth 2 (two) times daily.    Marland Kitchen BIOTIN 5000 PO Take 1 capsule by mouth daily.    . Cholecalciferol (VITAMIN D) 2000  UNITS tablet Take 2,000 Units by mouth at bedtime.    Marland Kitchen co-enzyme Q-10 30 MG capsule Take 30 mg by mouth 2 (two) times daily.     Marland Kitchen ezetimibe (ZETIA) 10 MG tablet Take 1 tablet (10 mg total) by mouth daily. 30 tablet 12  . fish oil-omega-3 fatty acids 1000 MG capsule Take 1 g by mouth 2 (two) times daily.    . furosemide (LASIX) 20 MG tablet One tablet daily by mouth as needed for swelling 30 tablet 3  . Magnesium 400 MG CAPS Take 400 mg by mouth daily.    . metoprolol (LOPRESSOR) 50 MG tablet TAKE 1 TABLET BY MOUTH TWICE DAILY 180 tablet 3  . Multiple Vitamins-Minerals  (CENTRUM SILVER PO) Take 1 tablet by mouth every morning.     Marland Kitchen omeprazole (PRILOSEC) 40 MG capsule Take 1 capsule (40 mg total) by mouth daily. 90 capsule 1   No current facility-administered medications on file prior to visit.    Review of Systems  Constitutional: Negative for unusual diaphoresis or night sweats HENT: Negative for ear swelling or discharge Eyes: Negative for worsening visual haziness  Respiratory: Negative for choking and stridor.   Gastrointestinal: Negative for distension or worsening eructation Genitourinary: Negative for retention or change in urine volume.  Musculoskeletal: Negative for other MSK pain or swelling Skin: Negative for color change and worsening wound Neurological: Negative for tremors and numbness other than noted  Psychiatric/Behavioral: Negative for decreased concentration or agitation other than above       Objective:   Physical Exam BP 120/76   Pulse 100   Temp 98.7 F (37.1 C) (Oral)   Resp 20   Wt 169 lb (76.7 kg)   SpO2 98%   BMI 30.91 kg/m  VS noted, mild ill Constitutional: Pt appears in no apparent distress HENT: Head: NCAT.  Right Ear: External ear normal.  Left Ear: External ear normal.  Eyes: . Pupils are equal, round, and reactive to light. Conjunctivae and EOM are normal Bilat tm's with mild erythema.  Max sinus areas mild tender.  Pharynx with mild erythema, no exudate Neck: Normal range of motion. Neck supple.  Cardiovascular: Normal rate and regular rhythm.   Pulmonary/Chest: Effort normal and breath sounds decreased without rales or wheezing.  Neurological: Pt is alert. Not confused , motor grossly intact Skin: Skin is warm. No rash, no LE edema Psychiatric: Pt behavior is normal. No agitation. mild nervous, not depressed affect    Assessment & Plan:

## 2016-07-25 NOTE — Progress Notes (Signed)
Pre visit review using our clinic review tool, if applicable. No additional management support is needed unless otherwise documented below in the visit note. 

## 2016-07-25 NOTE — Patient Instructions (Addendum)
You had the flu shot today  Please take all new medication as prescribed - the antibiotic, and cough medicine if needed  Please continue all other medications as before, and refills have been done if requested.  Please have the pharmacy call with any other refills you may need.  Please keep your appointments with your specialists as you may have planned

## 2016-07-27 NOTE — Assessment & Plan Note (Signed)
Mild to mod, for otc zyrtec/nasacort asd,  to f/u any worsening symptoms or concerns 

## 2016-07-27 NOTE — Assessment & Plan Note (Signed)
stable overall by history and exam, recent data reviewed with pt, and pt to continue medical treatment as before,  to f/u any worsening symptoms or concerns Lab Results  Component Value Date   WBC 7.2 05/25/2016   HGB 12.1 05/25/2016   HCT 35.9 (L) 05/25/2016   PLT 298 05/25/2016   GLUCOSE 125 (H) 05/25/2016   CHOL 194 12/22/2015   TRIG 119 12/22/2015   HDL 50 12/22/2015   LDLDIRECT 111.9 07/13/2014   LDLCALC 120 12/22/2015   ALT 19 12/22/2015   AST 19 12/22/2015   NA 131 (L) 05/25/2016   K 4.0 05/25/2016   CL 97 (L) 05/25/2016   CREATININE 1.01 (H) 05/25/2016   BUN 20 05/25/2016   CO2 26 05/25/2016   TSH 1.01 07/21/2015   INR 1.01 07/13/2013

## 2016-07-27 NOTE — Assessment & Plan Note (Signed)
Mild to mod, for antibx course,  to f/u any worsening symptoms or concerns 

## 2016-07-27 NOTE — Assessment & Plan Note (Signed)
stable overall by history and exam, recent data reviewed with pt, and pt to continue medical treatment as before,  to f/u any worsening symptoms or concerns BP Readings from Last 3 Encounters:  07/25/16 120/76  06/24/16 128/66  05/25/16 113/66

## 2016-08-05 ENCOUNTER — Other Ambulatory Visit: Payer: Self-pay | Admitting: Internal Medicine

## 2016-08-26 DIAGNOSIS — M6249 Contracture of muscle, multiple sites: Secondary | ICD-10-CM | POA: Diagnosis not present

## 2016-08-26 DIAGNOSIS — M7062 Trochanteric bursitis, left hip: Secondary | ICD-10-CM | POA: Diagnosis not present

## 2016-08-26 DIAGNOSIS — M9902 Segmental and somatic dysfunction of thoracic region: Secondary | ICD-10-CM | POA: Diagnosis not present

## 2016-08-26 DIAGNOSIS — M5106 Intervertebral disc disorders with myelopathy, lumbar region: Secondary | ICD-10-CM | POA: Diagnosis not present

## 2016-08-26 DIAGNOSIS — M5414 Radiculopathy, thoracic region: Secondary | ICD-10-CM | POA: Diagnosis not present

## 2016-08-26 DIAGNOSIS — M9903 Segmental and somatic dysfunction of lumbar region: Secondary | ICD-10-CM | POA: Diagnosis not present

## 2016-09-02 ENCOUNTER — Telehealth (INDEPENDENT_AMBULATORY_CARE_PROVIDER_SITE_OTHER): Payer: Self-pay | Admitting: *Deleted

## 2016-09-10 ENCOUNTER — Other Ambulatory Visit: Payer: Self-pay

## 2016-09-10 MED ORDER — EZETIMIBE 10 MG PO TABS: 10.0000 mg | ORAL_TABLET | Freq: Every day | ORAL | 10 refills | Status: DC

## 2016-09-12 ENCOUNTER — Ambulatory Visit (INDEPENDENT_AMBULATORY_CARE_PROVIDER_SITE_OTHER): Payer: Medicare Other | Admitting: Family

## 2016-09-12 ENCOUNTER — Other Ambulatory Visit (INDEPENDENT_AMBULATORY_CARE_PROVIDER_SITE_OTHER): Payer: Self-pay

## 2016-09-12 ENCOUNTER — Other Ambulatory Visit: Payer: Self-pay | Admitting: *Deleted

## 2016-09-12 ENCOUNTER — Encounter (INDEPENDENT_AMBULATORY_CARE_PROVIDER_SITE_OTHER): Payer: Self-pay | Admitting: Orthopedic Surgery

## 2016-09-12 ENCOUNTER — Ambulatory Visit (INDEPENDENT_AMBULATORY_CARE_PROVIDER_SITE_OTHER): Payer: Self-pay

## 2016-09-12 VITALS — Ht 62.0 in | Wt 169.0 lb

## 2016-09-12 DIAGNOSIS — M25571 Pain in right ankle and joints of right foot: Secondary | ICD-10-CM

## 2016-09-12 DIAGNOSIS — M1711 Unilateral primary osteoarthritis, right knee: Secondary | ICD-10-CM

## 2016-09-12 DIAGNOSIS — G5761 Lesion of plantar nerve, right lower limb: Secondary | ICD-10-CM

## 2016-09-12 MED ORDER — LIDOCAINE HCL 1 % IJ SOLN
1.0000 mL | INTRAMUSCULAR | Status: AC | PRN
  Administered 2016-09-12: 1 mL

## 2016-09-12 MED ORDER — OMEPRAZOLE 40 MG PO CPDR: 40.0000 mg | DELAYED_RELEASE_CAPSULE | Freq: Every day | ORAL | 2 refills | Status: DC

## 2016-09-12 MED ORDER — METHYLPREDNISOLONE ACETATE 40 MG/ML IJ SUSP
40.0000 mg | INTRAMUSCULAR | Status: AC | PRN
  Administered 2016-09-12: 40 mg via INTRA_ARTICULAR

## 2016-09-12 MED ORDER — LIDOCAINE HCL 1 % IJ SOLN
5.0000 mL | INTRAMUSCULAR | Status: AC | PRN
  Administered 2016-09-12: 5 mL

## 2016-09-12 NOTE — Progress Notes (Signed)
Office Visit Note   Patient: Leslie Bryant           Date of Birth: 10-09-1941           MRN: [id number] Visit Date: 09/12/2016              Requested by: Biagio Borg, MD [place], [place] [address] PCP: [name], MD   Assessment & Plan: Visit Diagnoses:  1. Pain in right ankle and joints of right foot   2. Morton's neuralgia, right   3. Primary osteoarthritis of right knee     Plan: We will order mild disc for her. We will call her when this is approved and have her return for repeat injection of the right knee. We'll follow-up on her Morton neuroma at that time. States is interested in proceeding with right knee replacement in the spring.  Follow-Up Instructions: Return for we will call when monovisc in.   Orders:  Orders Placed This Encounter  Procedures  . Large Joint Injection/Arthrocentesis  . Small Joint Injection/Arthrocentesis  . XR Foot Complete Right   No orders of the defined types were placed in this encounter.     Procedures: Large Joint Inj Date/Time: 09/12/2016 1:52 PM Performed by: Maxcine Ham Authorized by: Newt Minion   Consent Given by:  Patient Site marked: the procedure site was marked   Timeout: prior to procedure the correct patient, procedure, and site was verified   Indications:  Pain and diagnostic evaluation Location:  Knee Site:  R knee Prep: patient was prepped and draped in usual sterile fashion   Needle Size:  22 G Needle Length:  1.5 inches Approach:  Anteromedial Ultrasound Guidance: No   Fluoroscopic Guidance: No   Arthrogram: No Medications:  5 mL lidocaine 1 %; 40 mg methylPREDNISolone acetate 40 MG/ML Aspiration Attempted: No   Patient tolerance:  Patient tolerated the procedure well with no immediate complications  Small Joint Inj Date/Time: 09/12/2016 2:17 PM Performed by: Dondra Prader RENEE Authorized by: Dondra Prader RENEE   Consent Given by:  Patient Site marked: the procedure site  was marked   Timeout: prior to procedure the correct patient, procedure, and site was verified   Indications:  Pain and diagnostic evaluation Location:  Foot Foot joint: 3rd webspace. Prep: patient was prepped and draped in usual sterile fashion   Needle Size:  22 G Spinal Needle: No   Approach:  Dorsal Ultrasound Guided: No   Fluoroscopic Guidance: No   Medications:  1 mL lidocaine 1 %; 40 mg methylPREDNISolone acetate 40 MG/ML Aspiration Attempted: No   Patient tolerance:  Patient tolerated the procedure well with no immediate complications     Clinical Data: No additional findings.   Subjective: Chief Complaint  Patient presents with  . Right Foot - Pain    Right dorsal foot pain with no known injury.  . Right Knee - Pain, Injections    Patient presents today for right foot and knee pain. She has osteoarthritis of right knee and is hoping to get a cortisone injection today. Last injection was given to right knee on 06/17/16. She is also wanting to inquire about a possible hyaluronic acid injection. She is also having right dorsal foot pain with stiffness. She had chiropractor adjusted and it felt slightly better. There is no known injury.     Review of Systems  Constitutional: Negative for chills and fever.  Musculoskeletal: Positive for arthralgias.  All other  systems reviewed and are negative.    Objective: Vital Signs: Ht 5\' 2"  (1.575 m)   Wt 169 lb (76.7 kg)   BMI 30.91 kg/m   Physical Exam Physical Exam  Constitutional: He is oriented to person, place, and time. He appears well-developed and well-nourished.  Pulmonary/Chest: Effort normal.  Neurological: He is alert and oriented to person, place, and time.  Psychiatric: He has a normal mood and affect.  Nursing note reviewed.  Right foot: There is pain with lateral compression of metatarsal heads. Pain with palpation of the third webspace. This reproduces her symptoms. Foot is plantigrade does have  dorsiflexion to neutral. Right knee: No swelling. No effusion. Does have him lateral and medial joint line tenderness. The crepitation with range of motion. Stable to varus and valgus stress. Ortho Exam  Specialty Comments:  No specialty comments available.  Imaging: No results found.   PMFS History: Patient Active Problem List   Diagnosis Date Noted  . Dysuria 04/30/2016  . Acute sinus infection 10/13/2015  . Plantar fasciitis of right foot 12/28/2014  . Metatarsal deformity 12/28/2014  . Unspecified deficiency anemia 06/09/2013  . Anemia, unspecified 05/06/2013  . Lower back pain 02/01/2013  . Osteoarthritis of left knee 09/08/2012  . Complex tear of medial meniscus of left knee as current injury 09/08/2012  . Preventative health care 05/05/2012  . Family hx of colon cancer 05/05/2012  . History of breast cancer 02/06/2011  . Palpitations   . Supraventricular tachycardia ([place])   . EUSTACHIAN TUBE DYSFUNCTION, LEFT 04/11/2010  . Anxiety state 05/05/2008  . Essential hypertension 05/05/2008  . Allergic rhinitis 05/05/2008  . IBS 05/05/2008  . OSTEOPENIA 05/05/2008  . COLONIC POLYPS, HX OF 05/05/2008  . Hyperlipidemia 05/29/2007  . GERD 05/29/2007   Past Medical History:  Diagnosis Date  . Acute meniscal tear of knee LEFT KNEE  . Arthritis KNEES  . 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 Cancer [facility])    breast cancer right  . Diverticulitis of large intestine with perforation 11/02/2011   Microperforation probably related to nut and popcorn consumption.  Resolved by CT scan Recurrent episode clinical dx 07/2012   . Diverticulosis of colon    sigmoid  . GERD (gastroesophageal reflux disease)   . H/O hiatal hernia   . Hypercholesterolemia   . Left knee pain   . Palpitations IRREGULAR HEARTBEAT--  CONTROLLED W/  BETA BLOCKER  . Swelling of left knee joint     Family History  Problem Relation Age of Onset  . Heart attack Father   . Heart failure Sister   . Colon  cancer Brother     died/age 61  . Lung cancer Sister   . Brain cancer Sister   . Cancer Brother     mouth  . Leukemia Brother     Past Surgical History:  Procedure Laterality Date  . BREAST RECONTRUCTION W/ IMPLANTS  1987  . CARDIAC CATHETERIZATION  12-22-2000  DR BRACKBILL   NORMAL LVF/ NORMAL CORONARY ARTERIES  . CHOLECYSTECTOMY  1988  . CHONDROPLASTY  09/08/2012   Procedure: CHONDROPLASTY;  Surgeon: Tobi Bastos, MD;  Location: [facility];  Service: Orthopedics;  Laterality: Left;  . COLONOSCOPY     last colon 05-03-2009  . KNEE ARTHROSCOPY  2011   RIGHT KNEE  . KNEE ARTHROSCOPY WITH MEDIAL MENISECTOMY  09/08/2012   Procedure: KNEE ARTHROSCOPY WITH MEDIAL MENISECTOMY;  Surgeon: Tobi Bastos, MD;  Location: [facility];  Service: Orthopedics;  Laterality: Left;  lateral tibial plateau,   . MASTECTOMY  1986   RIGHT BREAST W/ MULTIPLE NODE DISSECTION  . POLYPECTOMY    . TOTAL KNEE ARTHROPLASTY Left 07/21/2013   Procedure: TOTAL KNEE ARTHROPLASTY;  Surgeon: Newt Minion, MD;  Location: [place];  Service: Orthopedics;  Laterality: Left;  Left Total Knee Arthroplasty  . VAGINAL HYSTERECTOMY  1966   Social History   Occupational History  . Retired    Social History Main Topics  . Smoking status: Never Smoker  . Smokeless tobacco: Never Used  . Alcohol use 0.6 oz/week    1 Glasses of wine per week     Comment: occ  . Drug use: No  . Sexual activity: No

## 2016-09-17 NOTE — Telephone Encounter (Signed)
ERROR

## 2016-09-23 ENCOUNTER — Telehealth (INDEPENDENT_AMBULATORY_CARE_PROVIDER_SITE_OTHER): Payer: Self-pay

## 2016-09-23 NOTE — Telephone Encounter (Signed)
I called and pt wanted to know if there was anything that she needed to do in prep for injection or afterwards. Advised that she did not need to do anything before hand and just to be activity as tolerated after injection. Will see her tomorrow.

## 2016-09-23 NOTE — Telephone Encounter (Signed)
?   ABOUT INJECTION TOMORROW - PLEASE CALL

## 2016-09-24 ENCOUNTER — Encounter (INDEPENDENT_AMBULATORY_CARE_PROVIDER_SITE_OTHER): Payer: Self-pay | Admitting: Family

## 2016-09-24 ENCOUNTER — Ambulatory Visit (INDEPENDENT_AMBULATORY_CARE_PROVIDER_SITE_OTHER): Payer: Medicare Other | Admitting: Family

## 2016-09-24 DIAGNOSIS — M1711 Unilateral primary osteoarthritis, right knee: Secondary | ICD-10-CM | POA: Diagnosis not present

## 2016-09-24 MED ORDER — LIDOCAINE HCL 1 % IJ SOLN
1.0000 mL | INTRAMUSCULAR | Status: AC | PRN
  Administered 2016-09-24: 1 mL

## 2016-09-24 MED ORDER — HYLAN G-F 20 48 MG/6ML IX SOSY
48.0000 mg | PREFILLED_SYRINGE | INTRA_ARTICULAR | Status: AC | PRN
  Administered 2016-09-24: 48 mg via INTRA_ARTICULAR

## 2016-09-24 NOTE — Progress Notes (Signed)
Office Visit Note   Patient: Leslie Bryant           Date of Birth: 05/13/1941           MRN: [id number] Visit Date: 09/24/2016              Requested by: Biagio Borg, MD [address] [place], [place] [address] PCP: [name], MD   Assessment & Plan: Visit Diagnoses:  1. Primary osteoarthritis of right knee     Plan: Recommended ice and ibuprofen for next few days. We'll follow up in the office as needed.  Follow-Up Instructions: No Follow-up on file.   Orders:  No orders of the defined types were placed in this encounter.  No orders of the defined types were placed in this encounter.     Procedures: Large Joint Inj Date/Time: 09/24/2016 9:57 AM Performed by: Dondra Prader RENEE Authorized by: Dondra Prader RENEE   Consent Given by:  Patient Site marked: the procedure site was marked   Timeout: prior to procedure the correct patient, procedure, and site was verified   Indications:  Pain and diagnostic evaluation Location:  Knee Site:  R knee Needle Size:  22 G Needle Length:  1.5 inches Ultrasound Guidance: No   Fluoroscopic Guidance: No   Arthrogram: No   Medications:  1 mL lidocaine 1 %; 48 mg Hylan 48 MG/6ML Aspiration Attempted: No   Patient tolerance:  Patient tolerated the procedure well with no immediate complications     Clinical Data: No additional findings.   Subjective: No chief complaint on file.   Patient is a 75 year old woman seen today for Synvisc injection right knee. Has been having pain and mechanical symptoms. Has trialed steroid injections which are no longer providing relief.     Review of Systems  Constitutional: Negative for chills and fever.  All other systems reviewed and are negative.    Objective: Vital Signs: There were no vitals taken for this visit.  Physical Exam  Constitutional: She is oriented to person, place, and time. She appears well-developed and well-nourished.  Pulmonary/Chest: Effort normal.    Musculoskeletal:       Right knee: She exhibits no effusion.  Neurological: She is alert and oriented to person, place, and time.  Psychiatric: She has a normal mood and affect.  Nursing note reviewed.   Right Knee Exam   Tenderness  The patient is experiencing tenderness in the lateral joint line and medial joint line.  Range of Motion  The patient has normal right knee ROM.  Tests  Varus: negative Valgus: negative  Other  Erythema: absent Swelling: mild Other tests: no effusion present  Comments:  Crepitation with range of motion      Specialty Comments:  No specialty comments available.  Imaging: No results found.   PMFS History: Patient Active Problem List   Diagnosis Date Noted  . Dysuria 04/30/2016  . Acute sinus infection 10/13/2015  . Plantar fasciitis of right foot 12/28/2014  . Metatarsal deformity 12/28/2014  . Unspecified deficiency anemia 06/09/2013  . Anemia, unspecified 05/06/2013  . Lower back pain 02/01/2013  . Osteoarthritis of left knee 09/08/2012  . Complex tear of medial meniscus of left knee as current injury 09/08/2012  . Preventative health care 05/05/2012  . Family hx of colon cancer 05/05/2012  . History of breast cancer 02/06/2011  . Palpitations   . Supraventricular tachycardia ([place])   . EUSTACHIAN TUBE DYSFUNCTION, LEFT 04/11/2010  .  Anxiety state 05/05/2008  . Essential hypertension 05/05/2008  . Allergic rhinitis 05/05/2008  . IBS 05/05/2008  . OSTEOPENIA 05/05/2008  . COLONIC POLYPS, HX OF 05/05/2008  . Hyperlipidemia 05/29/2007  . GERD 05/29/2007   Past Medical History:  Diagnosis Date  . Acute meniscal tear of knee LEFT KNEE  . Arthritis KNEES  . 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 Cancer [facility])    breast cancer right  . Diverticulitis of large intestine with perforation 11/02/2011   Microperforation probably related to nut and popcorn consumption.  Resolved by CT scan Recurrent episode clinical dx 07/2012   .  Diverticulosis of colon    sigmoid  . GERD (gastroesophageal reflux disease)   . H/O hiatal hernia   . Hypercholesterolemia   . Left knee pain   . Palpitations IRREGULAR HEARTBEAT--  CONTROLLED W/  BETA BLOCKER  . Swelling of left knee joint     Family History  Problem Relation Age of Onset  . Heart attack Father   . Heart failure Sister   . Colon cancer Brother     died/age 73  . Lung cancer Sister   . Brain cancer Sister   . Cancer Brother     mouth  . Leukemia Brother     Past Surgical History:  Procedure Laterality Date  . BREAST RECONTRUCTION W/ IMPLANTS  1987  . CARDIAC CATHETERIZATION  12-22-2000  DR BRACKBILL   NORMAL LVF/ NORMAL CORONARY ARTERIES  . CHOLECYSTECTOMY  1988  . CHONDROPLASTY  09/08/2012   Procedure: CHONDROPLASTY;  Surgeon: Tobi Bastos, MD;  Location: [facility];  Service: Orthopedics;  Laterality: Left;  . COLONOSCOPY     last colon 05-03-2009  . KNEE ARTHROSCOPY  2011   RIGHT KNEE  . KNEE ARTHROSCOPY WITH MEDIAL MENISECTOMY  09/08/2012   Procedure: KNEE ARTHROSCOPY WITH MEDIAL MENISECTOMY;  Surgeon: Tobi Bastos, MD;  Location: [place];  Service: Orthopedics;  Laterality: Left;  lateral tibial plateau,   . MASTECTOMY  1986   RIGHT BREAST W/ MULTIPLE NODE DISSECTION  . POLYPECTOMY    . TOTAL KNEE ARTHROPLASTY Left 07/21/2013   Procedure: TOTAL KNEE ARTHROPLASTY;  Surgeon: Newt Minion, MD;  Location: [place];  Service: Orthopedics;  Laterality: Left;  Left Total Knee Arthroplasty  . VAGINAL HYSTERECTOMY  1966   Social History   Occupational History  . Retired    Social History Main Topics  . Smoking status: Never Smoker  . Smokeless tobacco: Never Used  . Alcohol use 0.6 oz/week    1 Glasses of wine per week     Comment: occ  . Drug use: No  . Sexual activity: No

## 2016-09-30 DIAGNOSIS — M9903 Segmental and somatic dysfunction of lumbar region: Secondary | ICD-10-CM | POA: Diagnosis not present

## 2016-09-30 DIAGNOSIS — M9902 Segmental and somatic dysfunction of thoracic region: Secondary | ICD-10-CM | POA: Diagnosis not present

## 2016-09-30 DIAGNOSIS — M6249 Contracture of muscle, multiple sites: Secondary | ICD-10-CM | POA: Diagnosis not present

## 2016-09-30 DIAGNOSIS — M7062 Trochanteric bursitis, left hip: Secondary | ICD-10-CM | POA: Diagnosis not present

## 2016-09-30 DIAGNOSIS — M5106 Intervertebral disc disorders with myelopathy, lumbar region: Secondary | ICD-10-CM | POA: Diagnosis not present

## 2016-09-30 DIAGNOSIS — M5414 Radiculopathy, thoracic region: Secondary | ICD-10-CM | POA: Diagnosis not present

## 2016-10-23 ENCOUNTER — Other Ambulatory Visit: Payer: Self-pay

## 2016-10-23 MED ORDER — EZETIMIBE 10 MG PO TABS: 10.0000 mg | ORAL_TABLET | Freq: Every day | ORAL | 8 refills | Status: DC

## 2016-11-03 DIAGNOSIS — J189 Pneumonia, unspecified organism: Secondary | ICD-10-CM | POA: Diagnosis not present

## 2016-11-06 ENCOUNTER — Other Ambulatory Visit: Payer: Self-pay | Admitting: Internal Medicine

## 2016-11-06 ENCOUNTER — Encounter: Payer: Self-pay | Admitting: Internal Medicine

## 2016-11-06 ENCOUNTER — Ambulatory Visit (INDEPENDENT_AMBULATORY_CARE_PROVIDER_SITE_OTHER): Payer: Medicare Other | Admitting: Internal Medicine

## 2016-11-06 ENCOUNTER — Other Ambulatory Visit (INDEPENDENT_AMBULATORY_CARE_PROVIDER_SITE_OTHER): Payer: Medicare Other

## 2016-11-06 ENCOUNTER — Ambulatory Visit (INDEPENDENT_AMBULATORY_CARE_PROVIDER_SITE_OTHER)
Admission: RE | Admit: 2016-11-06 | Discharge: 2016-11-06 | Disposition: A | Discharge status: Home / Self Care | Payer: Medicare Other | Source: Ambulatory Visit | Attending: Internal Medicine | Admitting: Internal Medicine

## 2016-11-06 VITALS — BP 136/80 | HR 87 | Temp 98.4°F | Resp 20 | Wt 165.0 lb

## 2016-11-06 DIAGNOSIS — R05 Cough: Secondary | ICD-10-CM

## 2016-11-06 DIAGNOSIS — I10 Essential (primary) hypertension: Secondary | ICD-10-CM | POA: Diagnosis not present

## 2016-11-06 DIAGNOSIS — E119 Type 2 diabetes mellitus without complications: Secondary | ICD-10-CM

## 2016-11-06 DIAGNOSIS — R739 Hyperglycemia, unspecified: Secondary | ICD-10-CM | POA: Diagnosis not present

## 2016-11-06 DIAGNOSIS — Z0001 Encounter for general adult medical examination with abnormal findings: Secondary | ICD-10-CM

## 2016-11-06 DIAGNOSIS — Z23 Encounter for immunization: Secondary | ICD-10-CM

## 2016-11-06 DIAGNOSIS — E871 Hypo-osmolality and hyponatremia: Secondary | ICD-10-CM

## 2016-11-06 DIAGNOSIS — R059 Cough, unspecified: Secondary | ICD-10-CM

## 2016-11-06 LAB — URINALYSIS, ROUTINE W REFLEX MICROSCOPIC
Bilirubin Urine: NEGATIVE
HGB URINE DIPSTICK: NEGATIVE
Ketones, ur: NEGATIVE
Leukocytes, UA: NEGATIVE
NITRITE: NEGATIVE
RBC / HPF: NONE SEEN (ref 0–?)
Specific Gravity, Urine: <=1.005 — AB (ref 1.000–1.030)
TOTAL PROTEIN, URINE-UPE24: NEGATIVE
Urine Glucose: NEGATIVE
Urobilinogen, UA: 0.2 (ref 0.0–1.0)
pH: 6 (ref 5.0–8.0)

## 2016-11-06 LAB — CBC WITH DIFFERENTIAL/PLATELET
BASOS PCT: 0.6 % (ref 0.0–3.0)
Basophils Absolute: 0.1 10*3/uL (ref 0.0–0.1)
EOS PCT: 3 % (ref 0.0–5.0)
Eosinophils Absolute: 0.4 10*3/uL (ref 0.0–0.7)
HCT: 35.9 % — ABNORMAL LOW (ref 36.0–46.0)
HEMOGLOBIN: 12 g/dL (ref 12.0–15.0)
Lymphocytes Relative: 22 % (ref 12.0–46.0)
Lymphs Abs: 2.7 10*3/uL (ref 0.7–4.0)
MCHC: 33.5 g/dL (ref 30.0–36.0)
MCV: 91.9 fl (ref 78.0–100.0)
MONO ABS: 0.9 10*3/uL (ref 0.1–1.0)
Monocytes Relative: 7.1 % (ref 3.0–12.0)
NEUTROS ABS: 8.2 10*3/uL — AB (ref 1.4–7.7)
Neutrophils Relative %: 67.3 % (ref 43.0–77.0)
PLATELETS: 365 10*3/uL (ref 150.0–400.0)
RBC: 3.9 Mil/uL (ref 3.87–5.11)
RDW: 12.6 % (ref 11.5–15.5)
WBC: 12.1 10*3/uL — AB (ref 4.0–10.5)

## 2016-11-06 LAB — LIPID PANEL
CHOL/HDL RATIO: 4
Cholesterol: 150 mg/dL (ref 0–200)
HDL: 42.1 mg/dL (ref >39.00)
LDL CALC: 74 mg/dL (ref 0–99)
NONHDL: 108.05
Triglycerides: 171 mg/dL — ABNORMAL HIGH (ref 0.0–149.0)
VLDL: 34.2 mg/dL (ref 0.0–40.0)

## 2016-11-06 LAB — BASIC METABOLIC PANEL
BUN: 11 mg/dL (ref 6–23)
CHLORIDE: 96 meq/L (ref 96–112)
CO2: 28 mEq/L (ref 19–32)
Calcium: 9.7 mg/dL (ref 8.4–10.5)
Creatinine, Ser: 0.81 mg/dL (ref 0.40–1.20)
GFR: 73 mL/min (ref >60.00)
Glucose, Bld: 100 mg/dL — ABNORMAL HIGH (ref 70–99)
POTASSIUM: 4.2 meq/L (ref 3.5–5.1)
SODIUM: 134 meq/L — AB (ref 135–145)

## 2016-11-06 LAB — HEPATIC FUNCTION PANEL
ALK PHOS: 70 U/L (ref 39–117)
ALT: 39 U/L — ABNORMAL HIGH (ref 0–35)
AST: 25 U/L (ref 0–37)
Albumin: 4.2 g/dL (ref 3.5–5.2)
BILIRUBIN DIRECT: 0.1 mg/dL (ref 0.0–0.3)
BILIRUBIN TOTAL: 0.2 mg/dL (ref 0.2–1.2)
Total Protein: 7.9 g/dL (ref 6.0–8.3)

## 2016-11-06 LAB — TSH: TSH: 1.08 u[IU]/mL (ref 0.35–4.50)

## 2016-11-06 LAB — HEMOGLOBIN A1C: HEMOGLOBIN A1C: 6.8 % — AB (ref 4.6–6.5)

## 2016-11-06 MED ORDER — METFORMIN HCL 500 MG PO TABS: 500.0000 mg | ORAL_TABLET | Freq: Every day | ORAL | 3 refills | Status: DC

## 2016-11-06 NOTE — Assessment & Plan Note (Signed)
stable overall by history and exam, recent data reviewed with pt, and pt to continue medical treatment as before,  to f/u any worsening symptoms or concerns BP Readings from Last 3 Encounters:  11/06/16 136/80  07/25/16 120/76  06/24/16 128/66

## 2016-11-06 NOTE — Progress Notes (Signed)
Subjective:    Patient ID: Leslie Bryant, female    DOB: 12/15/1940, 76 y.o.   MRN: [id number]  HPI  Here for wellness and f/u;  Overall doing ok;  Pt denies Chest pain, worsening SOB, DOE, wheezing, orthopnea, PND, worsening LE edema, palpitations, dizziness or syncope.  Pt denies neurological change such as new headache, facial or extremity weakness.  Pt denies polydipsia, polyuria, or low sugar symptoms. Pt states overall good compliance with treatment and medications, good tolerability, and has been trying to follow appropriate diet.  Pt denies worsening depressive symptoms, suicidal ideation or panic. No fever, night sweats, wt loss, loss of appetite, or other constitutional symptoms.  Pt states good ability with ADL's, has low fall risk, home safety reviewed and adequate, no other significant changes in hearing or vision, and only occasionally active with exercise. Seen at ED July 2017 with lab with mild chronic low sodium but new glc 125.  Has lost recently, but overall gained wt in past several yrs.  Wt Readings from Last 3 Encounters:  11/06/16 165 lb (74.8 kg)  09/12/16 169 lb (76.7 kg)  07/25/16 169 lb (76.7 kg)  Also with [facility], pt recalls noting bibasilar rales and tx for walking double PNA with emycin and cough med prn use.  Pt overall feels much improved today for the first time, and believes she is getting better, cough persists, but no wheezing or worsening sob Past Medical History:  Diagnosis Date  . Acute meniscal tear of knee LEFT KNEE  . Arthritis KNEES  . 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 Cancer [facility])    breast cancer right  . Diverticulitis of large intestine with perforation 11/02/2011   Microperforation probably related to nut and popcorn consumption.  Resolved by CT scan Recurrent episode clinical dx 07/2012   . Diverticulosis of colon    sigmoid  . GERD (gastroesophageal reflux disease)   . H/O hiatal hernia   .  Hypercholesterolemia   . Left knee pain   . Palpitations IRREGULAR HEARTBEAT--  CONTROLLED W/  BETA BLOCKER  . Swelling of left knee joint    Past Surgical History:  Procedure Laterality Date  . BREAST RECONTRUCTION W/ IMPLANTS  1987  . CARDIAC CATHETERIZATION  12-22-2000  Leslie Bryant   NORMAL LVF/ NORMAL CORONARY ARTERIES  . CHOLECYSTECTOMY  1988  . CHONDROPLASTY  09/08/2012   Procedure: CHONDROPLASTY;  Surgeon: Leslie Bastos, MD;  Location: [facility];  Service: Orthopedics;  Laterality: Left;  . COLONOSCOPY     last colon 05-03-2009  . KNEE ARTHROSCOPY  2011   RIGHT KNEE  . KNEE ARTHROSCOPY WITH MEDIAL MENISECTOMY  09/08/2012   Procedure: KNEE ARTHROSCOPY WITH MEDIAL MENISECTOMY;  Surgeon: Leslie Bastos, MD;  Location: [place];  Service: Orthopedics;  Laterality: Left;  lateral tibial plateau,   . MASTECTOMY  1986   RIGHT BREAST W/ MULTIPLE NODE DISSECTION  . POLYPECTOMY    . TOTAL KNEE ARTHROPLASTY Left 07/21/2013   Procedure: TOTAL KNEE ARTHROPLASTY;  Surgeon: Leslie Minion, MD;  Location: [place];  Service: Orthopedics;  Laterality: Left;  Left Total Knee Arthroplasty  . VAGINAL HYSTERECTOMY  1966    reports that she has never smoked. She has never used smokeless tobacco. She reports that she drinks about 0.6 oz of alcohol per week . She reports that she does not use drugs. family history includes Brain cancer in  her sister; Cancer in her brother; Colon cancer in her brother; Heart attack in her father; Heart failure in her sister; Leukemia in her brother; Lung cancer in her sister. Allergies  Allergen Reactions  . Crestor [Rosuvastatin Calcium] Other (See Comments)    Shoulder pain  . Lipitor [Atorvastatin Calcium] Other (See Comments)    Myalgias   . Pravastatin Other (See Comments)    myalgias  . Penicillins Itching   Current Outpatient Prescriptions on File Prior to Visit  Medication Sig Dispense Refill  . ALPRAZolam (XANAX) 0.25  MG tablet Take 0.25 mg by mouth at bedtime as needed for anxiety (additional refills need to come from PCP).    Marland Kitchen aspirin 81 MG tablet Take 81 mg by mouth 2 (two) times daily.    Marland Kitchen BIOTIN 5000 PO Take 1 capsule by mouth daily.    . Cholecalciferol (VITAMIN D) 2000 UNITS tablet Take 2,000 Units by mouth at bedtime.    Marland Kitchen co-enzyme Q-10 30 MG capsule Take 30 mg by mouth 2 (two) times daily.     Marland Kitchen ezetimibe (ZETIA) 10 MG tablet Take 1 tablet (10 mg total) by mouth daily. 30 tablet 8  . fish oil-omega-3 fatty acids 1000 MG capsule Take 1 g by mouth 2 (two) times daily.    . furosemide (LASIX) 20 MG tablet One tablet daily by mouth as needed for swelling 30 tablet 3  . Magnesium 400 MG CAPS Take 400 mg by mouth daily.    . metoprolol (LOPRESSOR) 50 MG tablet TAKE 1 TABLET BY MOUTH TWICE DAILY 180 tablet 3  . Multiple Vitamins-Minerals (CENTRUM SILVER PO) Take 1 tablet by mouth every morning.     Marland Kitchen omeprazole (PRILOSEC) 40 MG capsule Take 1 capsule (40 mg total) by mouth daily. 90 capsule 2   No current facility-administered medications on file prior to visit.    Review of Systems Constitutional: Negative for increased diaphoresis, or other activity, appetite or siginficant weight change other than noted HENT: Negative for worsening hearing loss, ear pain, facial swelling, mouth sores and neck stiffness.   Eyes: Negative for other worsening pain, redness or visual disturbance.  Respiratory: Negative for choking or stridor Cardiovascular: Negative for other chest pain and palpitations.  Gastrointestinal: Negative for worsening diarrhea, blood in stool, or abdominal distention Genitourinary: Negative for hematuria, flank pain or change in urine volume.  Musculoskeletal: Negative for myalgias or other joint complaints.  Skin: Negative for other color change and wound or drainage.  Neurological: Negative for syncope and numbness. other than noted Hematological: Negative for adenopathy. or other  swelling Psychiatric/Behavioral: Negative for hallucinations, SI, self-injury, decreased concentration or other worsening agitation.  All other system neg per pt    Objective:   Physical Exam BP 136/80   Pulse 87   Temp 98.4 F (36.9 C) (Oral)   Resp 20   Wt 165 lb (74.8 kg)   SpO2 95%   BMI 30.18 kg/m  VS noted, mild ill, fatigued, non toxic Constitutional: Pt is oriented to person, place, and time. Appears well-developed and well-nourished, in no significant distress Head: Normocephalic and atraumatic  Eyes: Conjunctivae and EOM are normal. Pupils are equal, round, and reactive to light Right Ear: External ear normal.  Left Ear: External ear normal Nose: Nose normal.  Bilat tm's with mild erythema.  Max sinus areas non tender.  Pharynx with mild erythema, no exudate Mouth/Throat: Oropharynx is clear and moist  Neck: Normal range of motion. Neck supple. No JVD present. No  tracheal deviation present or significant neck LA or mass Cardiovascular: Normal rate, regular rhythm, normal heart sounds and intact distal pulses.   Pulmonary/Chest: Effort normal and breath sounds decreased without rales or wheezing  Abdominal: Soft. Bowel sounds are normal. NT. No HSM  Musculoskeletal: Normal range of motion. Exhibits no edema Lymphadenopathy: Has no cervical adenopathy.  Neurological: Pt is alert and oriented to person, place, and time. Pt has normal reflexes. No cranial nerve deficit. Motor grossly intact Skin: Skin is warm and dry. No rash noted or new ulcers Psychiatric:  Has mild nervous mood and affect. Behavior is normal.  No other new exam findings    Assessment & Plan:

## 2016-11-06 NOTE — Assessment & Plan Note (Signed)
Chronic mild stable,  to f/u any worsening symptoms or concerns 

## 2016-11-06 NOTE — Progress Notes (Signed)
Pre visit review using our clinic review tool, if applicable. No additional management support is needed unless otherwise documented below in the visit note. 

## 2016-11-06 NOTE — Assessment & Plan Note (Signed)

## 2016-11-06 NOTE — Assessment & Plan Note (Addendum)
Mild now, overall improved, not clear if had pna but suspect bronchitis vs pna, to cont antibx course, cough med prn, for cxr tofday,  to f/u any worsening symptoms or concerns  In addition to the time spent performing CPE, I spent an additional 15 minutes face to face,in which greater than 50% of this time was spent in counseling and coordination of care for patient's illness as documented.

## 2016-11-06 NOTE — Assessment & Plan Note (Signed)
Mild, new seen July 2017, for f/u a1c today with labs

## 2016-11-06 NOTE — Patient Instructions (Addendum)
You had the Tdap tetanus shot today  Please continue all other medications as before, and refills have been done if requested.  Please have the pharmacy call with any other refills you may need.  Please continue your efforts at being more active, low cholesterol diet, and weight control.  You are otherwise up to date with prevention measures today.  Please keep your appointments with your specialists as you may have planned  Please go to the XRAY Department in the Basement (go straight as you get off the elevator) for the x-ray testing  Please go to the LAB in the Basement (turn left off the elevator) for the tests to be done today  You will be contacted by phone if any changes need to be made immediately.  Otherwise, you will receive a letter about your results with an explanation, but please check with MyChart first.  Please remember to sign up for MyChart if you have not done so, as this will be important to you in the future with finding out test results, communicating by private email, and scheduling acute appointments online when needed.  Please return in 6 months, or sooner if needed

## 2016-12-04 ENCOUNTER — Other Ambulatory Visit: Payer: Self-pay

## 2016-12-04 MED ORDER — OMEPRAZOLE 40 MG PO CPDR: 40.0000 mg | DELAYED_RELEASE_CAPSULE | Freq: Every day | ORAL | 0 refills | Status: DC

## 2016-12-24 ENCOUNTER — Encounter: Payer: Self-pay | Admitting: Internal Medicine

## 2016-12-24 ENCOUNTER — Ambulatory Visit (INDEPENDENT_AMBULATORY_CARE_PROVIDER_SITE_OTHER): Payer: Medicare Other | Admitting: Internal Medicine

## 2016-12-24 VITALS — BP 124/68 | HR 85 | Ht 61.5 in | Wt 159.2 lb

## 2016-12-24 DIAGNOSIS — I10 Essential (primary) hypertension: Secondary | ICD-10-CM | POA: Diagnosis not present

## 2016-12-24 DIAGNOSIS — E7800 Pure hypercholesterolemia, unspecified: Secondary | ICD-10-CM | POA: Diagnosis not present

## 2016-12-24 DIAGNOSIS — Z01810 Encounter for preprocedural cardiovascular examination: Secondary | ICD-10-CM | POA: Insufficient documentation

## 2016-12-24 DIAGNOSIS — R002 Palpitations: Secondary | ICD-10-CM

## 2016-12-24 NOTE — Patient Instructions (Signed)
Your physician recommends that you continue on your current medications as directed. Please refer to the Current Medication list given to you today.  Your physician wants you to follow-up in: ONE YEAR with Dr. Debara Pickett. You will receive a reminder letter in the mail two months in advance. If you don't receive a letter, please call our office to schedule the follow-up appointment.

## 2016-12-24 NOTE — Progress Notes (Signed)
OFFICE NOTE  Chief Complaint:  Right knee pain  Primary Care Physician: [name], MD  HPI:  Leslie Bryant is a 76 y.o. female who is a former patient of Dr. Mare Ferrari. She is followed along with him for a number years and he also followed her husband who unfortunately died. She has no known coronary artery disease. She had heart catheterization 2002 which showed essentially normal coronaries. She's been followed for palpitations, history of PSVT which is been well controlled and dyslipidemia. Recently she was seen in the emergency department after working strenuously in the yard on a very hot day. She felt some fluttering in her chest and was noted to have some unifocal PVCs on a monitor. Recently her metoprolol was increased to 50 mg twice a day. She was advised to decrease caffeine intake. Since that time she's had no further recurrence and felt like it may be related to working outside in the hot weather. Blood pressure is well-controlled today. She has a history of intolerance to statins. Recently she had had some pain in her legs and thought it was related to study of. She was taken off that medicine and listed as an allergy however her symptoms did not change therefore she is interested in going back on the medicine. While she was on Zetia her LDL was 120 which is reasonable control.   12/24/2016  Leslie Bryant returns a for follow-up. Overall she seems to be doing well although suffering from some right knee pain. She told me that she intends to have right knee surgery hopefully in April with Dr. Sharol Given. She is asking for cardiovascular clearance. She denies any chest pain or worsening shortness of breath. Her blood pressure is well-controlled. She has been tolerant of ezetimibe 10 mg daily for cholesterol and recently her LDL was less than 80. She's previous he been intolerant of pravastatin, atorvastatin and rosuvastatin.  PMHx:  Past Medical History:  Diagnosis Date  . Acute meniscal  tear of knee LEFT KNEE  . Arthritis KNEES  . 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 Cancer [facility])    breast cancer right  . Diverticulitis of large intestine with perforation 11/02/2011   Microperforation probably related to nut and popcorn consumption.  Resolved by CT scan Recurrent episode clinical dx 07/2012   . Diverticulosis of colon    sigmoid  . GERD (gastroesophageal reflux disease)   . H/O hiatal hernia   . Hypercholesterolemia   . Left knee pain   . Palpitations IRREGULAR HEARTBEAT--  CONTROLLED W/  BETA BLOCKER  . Swelling of left knee joint     Past Surgical History:  Procedure Laterality Date  . BREAST RECONTRUCTION W/ IMPLANTS  1987  . CARDIAC CATHETERIZATION  12-22-2000  DR BRACKBILL   NORMAL LVF/ NORMAL CORONARY ARTERIES  . CHOLECYSTECTOMY  1988  . CHONDROPLASTY  09/08/2012   Procedure: CHONDROPLASTY;  Surgeon: Tobi Bastos, MD;  Location: [facility];  Service: Orthopedics;  Laterality: Left;  . COLONOSCOPY     last colon 05-03-2009  . KNEE ARTHROSCOPY  2011   RIGHT KNEE  . KNEE ARTHROSCOPY WITH MEDIAL MENISECTOMY  09/08/2012   Procedure: KNEE ARTHROSCOPY WITH MEDIAL MENISECTOMY;  Surgeon: Tobi Bastos, MD;  Location: [place];  Service: Orthopedics;  Laterality: Left;  lateral tibial plateau,   . MASTECTOMY  1986   RIGHT BREAST W/ MULTIPLE NODE DISSECTION  . POLYPECTOMY    . TOTAL KNEE ARTHROPLASTY  Left 07/21/2013   Procedure: TOTAL KNEE ARTHROPLASTY;  Surgeon: Newt Minion, MD;  Location: [place];  Service: Orthopedics;  Laterality: Left;  Left Total Knee Arthroplasty  . VAGINAL HYSTERECTOMY  1966    FAMHx:  Family History  Problem Relation Age of Onset  . Heart attack Father   . Heart failure Sister   . Colon cancer Brother     died/age 41  . Lung cancer Sister   . Brain cancer Sister   . Cancer Brother     mouth  . Leukemia Brother     SOCHx:   reports that she has never smoked. She has never used smokeless  tobacco. She reports that she drinks about 0.6 oz of alcohol per week . She reports that she does not use drugs.  ALLERGIES:  Allergies  Allergen Reactions  . Crestor [Rosuvastatin Calcium] Other (See Comments)    Shoulder pain  . Lipitor [Atorvastatin Calcium] Other (See Comments)    Myalgias   . Pravastatin Other (See Comments)    myalgias  . Penicillins Itching    ROS: Pertinent items noted in HPI and remainder of comprehensive ROS otherwise negative.  HOME MEDS: Current Outpatient Prescriptions  Medication Sig Dispense Refill  . ALPRAZolam (XANAX) 0.25 MG tablet Take 0.25 mg by mouth at bedtime as needed for anxiety (additional refills need to come from PCP).    Marland Kitchen aspirin 81 MG tablet Take 81 mg by mouth 2 (two) times daily.    Marland Kitchen BIOTIN 5000 PO Take 1 capsule by mouth daily.    Marland Kitchen co-enzyme Q-10 30 MG capsule Take 30 mg by mouth 2 (two) times daily.     Marland Kitchen ezetimibe (ZETIA) 10 MG tablet Take 1 tablet (10 mg total) by mouth daily. 30 tablet 8  . fish oil-omega-3 fatty acids 1000 MG capsule Take 1 g by mouth 2 (two) times daily.    . furosemide (LASIX) 20 MG tablet One tablet daily by mouth as needed for swelling 30 tablet 3  . Magnesium 400 MG CAPS Take 400 mg by mouth daily.    . metFORMIN (GLUCOPHAGE) 500 MG tablet Take 1 tablet (500 mg total) by mouth daily with breakfast. 90 tablet 3  . metoprolol (LOPRESSOR) 50 MG tablet TAKE 1 TABLET BY MOUTH TWICE DAILY 180 tablet 3  . Multiple Vitamins-Minerals (CENTRUM SILVER PO) Take 1 tablet by mouth every morning.     Marland Kitchen omeprazole (PRILOSEC) 40 MG capsule Take 1 capsule (40 mg total) by mouth daily. 90 capsule 0   No current facility-administered medications for this visit.     LABS/IMAGING: No results found for this or any previous visit (from the past 48 hour(s)). No results found.  WEIGHTS: Wt Readings from Last 3 Encounters:  12/24/16 159 lb 3.2 oz (72.2 kg)  11/06/16 165 lb (74.8 kg)  09/12/16 169 lb (76.7 kg)     VITALS: BP 124/68   Pulse 85   Ht 5' 1.5" (1.562 m)   Wt 159 lb 3.2 oz (72.2 kg)   BMI 29.59 kg/m   EXAM: General appearance: alert and no distress Neck: no carotid bruit and no JVD Lungs: clear to auscultation bilaterally Heart: regular rate and rhythm and systolic murmur: early systolic 2/6, crescendo at 2nd right intercostal space Abdomen: soft, non-tender; bowel sounds normal; no masses,  no organomegaly Extremities: extremities normal, atraumatic, no cyanosis or edema Pulses: 2+ and symmetric Skin: Skin color, texture, turgor normal. No rashes or lesions Neurologic: Grossly normal Psych: Pleasant  EKG: Sinus rhythm at 85  ASSESSMENT: 1. Palpitations-history of PSVT and occasional PVCs 2. Dyslipidemia-intolerant to statins 3. Essential hypertension 4. Low risk for surgery  PLAN: 1.   Leslie Bryant denies any recurrent palpitations and seems to be pleased with her metoprolol. Cholesterol is actually well controlled now on ezetimibe, but she has been intolerant to statins. Blood pressure is well-controlled. I feel that she is at low risk for upcoming knee surgery and will not need additional testing. I'll forward this note on to Dr. Sharol Given for surgical clearance.   Pixie Casino, MD, [facility] Attending Cardiologist [place] [facility] Goldy Calandra 12/24/2016, 1:23 PM

## 2017-01-13 ENCOUNTER — Ambulatory Visit (INDEPENDENT_AMBULATORY_CARE_PROVIDER_SITE_OTHER): Payer: Medicare Other | Admitting: Family

## 2017-01-13 DIAGNOSIS — M1711 Unilateral primary osteoarthritis, right knee: Secondary | ICD-10-CM

## 2017-01-13 NOTE — Progress Notes (Signed)
Office Visit Note   Patient: Leslie Bryant           Date of Birth: January 14, 1941           MRN: [id number] Visit Date: 01/13/2017              Requested by: Biagio Borg, MD [address], [place] [address] PCP: [name], MD  Chief Complaint  Patient presents with  . Right Knee - Pain    HPI: Patient is a 76 year old woman seen today in follow up for osteoarthritis and pain right knee. Is about 4 months out from Synvisc injection right knee. Has been having pain and mechanical symptoms. Has trialed steroid injections which are no longer providing relief.   Minimal relief from oral antiinflammatories.   Assessment & Plan: Visit Diagnoses:  1. Primary osteoarthritis of right knee     Plan: Patient would like to proceed with TKA, right. Will get this set up at her convenience.   Follow-Up Instructions: Return for will call for monovisc.   Physical Exam  Constitutional: Appears well-developed.  Head: Normocephalic.  Eyes: EOM are normal.  Neck: Normal range of motion.  Cardiovascular: Normal rate.   Pulmonary/Chest: Effort normal.  Neurological: Is alert.  Skin: Skin is warm.  Psychiatric: Has a normal mood and affect. Does have an antalgic gait.  Right Knee Exam   Tenderness  The patient is experiencing tenderness in the medial joint line.  Range of Motion  The patient has normal right knee ROM.  Muscle Strength   The patient has normal right knee strength.  Tests  Varus: negative Valgus: negative  Other  Erythema: absent Swelling: mild Other tests: no effusion present       Imaging: No results found.  Labs: Lab Results  Component Value Date   HGBA1C 6.8 (H) 11/06/2016   ESRSEDRATE 44 (H) 07/23/2012   REPTSTATUS 11/10/2011 FINAL 11/07/2011   GRAMSTAIN  11/07/2011    ABUNDANT WBC PRESENT, PREDOMINANTLY PMN NO SQUAMOUS EPITHELIAL CELLS SEEN ABUNDANT GRAM POSITIVE COCCI IN PAIRS AND CHAINS   CULT  11/07/2011    MODERATE  MICROAEROPHILIC STREPTOCOCCI Note: Standardized susceptibility testing for this organism is not available.   LABORGA NO GROWTH 04/30/2016    Orders:  No orders of the defined types were placed in this encounter.  No orders of the defined types were placed in this encounter.    Procedures: No procedures performed  Clinical Data: No additional findings.  Subjective: Review of Systems  Constitutional: Negative for chills and fever.    Objective: Vital Signs: There were no vitals taken for this visit.  Specialty Comments:  No specialty comments available.  PMFS History: Patient Active Problem List   Diagnosis Date Noted  . Preoperative cardiovascular examination 12/24/2016  . Cough 11/06/2016  . Hyperglycemia 11/06/2016  . Hyponatremia 11/06/2016  . Diabetes ([place]) 11/06/2016  . Dysuria 04/30/2016  . Plantar fasciitis of right foot 12/28/2014  . Metatarsal deformity 12/28/2014  . Unspecified deficiency anemia 06/09/2013  . Anemia, unspecified 05/06/2013  . Lower back pain 02/01/2013  . Osteoarthritis of left knee 09/08/2012  . Complex tear of medial meniscus of left knee as current injury 09/08/2012  . Encounter for well adult exam with abnormal findings 05/05/2012  . Family hx of colon cancer 05/05/2012  . History of breast cancer 02/06/2011  . Palpitations   . Supraventricular tachycardia ([place])   . EUSTACHIAN TUBE DYSFUNCTION, LEFT 04/11/2010  . Anxiety  state 05/05/2008  . Essential hypertension 05/05/2008  . Allergic rhinitis 05/05/2008  . IBS 05/05/2008  . OSTEOPENIA 05/05/2008  . COLONIC POLYPS, HX OF 05/05/2008  . Hyperlipidemia 05/29/2007  . GERD 05/29/2007   Past Medical History:  Diagnosis Date  . Acute meniscal tear of knee LEFT KNEE  . Arthritis KNEES  . 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 Cancer [facility])    breast cancer right  . Diverticulitis of large intestine with perforation 11/02/2011   Microperforation probably related to nut and popcorn  consumption.  Resolved by CT scan Recurrent episode clinical dx 07/2012   . Diverticulosis of colon    sigmoid  . GERD (gastroesophageal reflux disease)   . H/O hiatal hernia   . Hypercholesterolemia   . Left knee pain   . Palpitations IRREGULAR HEARTBEAT--  CONTROLLED W/  BETA BLOCKER  . Swelling of left knee joint     Family History  Problem Relation Age of Onset  . Heart attack Father   . Heart failure Sister   . Colon cancer Brother     died/age 65  . Lung cancer Sister   . Brain cancer Sister   . Cancer Brother     mouth  . Leukemia Brother     Past Surgical History:  Procedure Laterality Date  . BREAST RECONTRUCTION W/ IMPLANTS  1987  . CARDIAC CATHETERIZATION  12-22-2000  DR BRACKBILL   NORMAL LVF/ NORMAL CORONARY ARTERIES  . CHOLECYSTECTOMY  1988  . CHONDROPLASTY  09/08/2012   Procedure: CHONDROPLASTY;  Surgeon: Tobi Bastos, MD;  Location: [facility];  Service: Orthopedics;  Laterality: Left;  . COLONOSCOPY     last colon 05-03-2009  . KNEE ARTHROSCOPY  2011   RIGHT KNEE  . KNEE ARTHROSCOPY WITH MEDIAL MENISECTOMY  09/08/2012   Procedure: KNEE ARTHROSCOPY WITH MEDIAL MENISECTOMY;  Surgeon: Tobi Bastos, MD;  Location: [facility];  Service: Orthopedics;  Laterality: Left;  lateral tibial plateau,   . MASTECTOMY  1986   RIGHT BREAST W/ MULTIPLE NODE DISSECTION  . POLYPECTOMY    . TOTAL KNEE ARTHROPLASTY Left 07/21/2013   Procedure: TOTAL KNEE ARTHROPLASTY;  Surgeon: Newt Minion, MD;  Location: [facility];  Service: Orthopedics;  Laterality: Left;  Left Total Knee Arthroplasty  . VAGINAL HYSTERECTOMY  1966   Social History   Occupational History  . Retired    Social History Main Topics  . Smoking status: Never Smoker  . Smokeless tobacco: Never Used  . Alcohol use 0.6 oz/week    1 Glasses of wine per week     Comment: occ  . Drug use: No  . Sexual activity: No

## 2017-01-23 ENCOUNTER — Ambulatory Visit (INDEPENDENT_AMBULATORY_CARE_PROVIDER_SITE_OTHER): Payer: Medicare Other | Admitting: Internal Medicine

## 2017-01-23 VITALS — BP 130/72 | HR 83 | Temp 98.2°F | Resp 16 | Wt 162.0 lb

## 2017-01-23 DIAGNOSIS — E119 Type 2 diabetes mellitus without complications: Secondary | ICD-10-CM

## 2017-01-23 DIAGNOSIS — H6593 Unspecified nonsuppurative otitis media, bilateral: Secondary | ICD-10-CM | POA: Insufficient documentation

## 2017-01-23 MED ORDER — LEVOFLOXACIN 250 MG PO TABS: 250.0000 mg | ORAL_TABLET | Freq: Every day | ORAL | 0 refills | Status: DC

## 2017-01-23 NOTE — Patient Instructions (Signed)
Please take all new medication as prescribed - the antibiotic  Please continue all other medications as before, and refills have been done if requested.  Please have the pharmacy call with any other refills you may need.  Please keep your appointments with your specialists as you may have planned  Good Luck with your right knee surgury soon

## 2017-01-23 NOTE — Progress Notes (Signed)
Pre visit review using our clinic review tool, if applicable. No additional management support is needed unless otherwise documented below in the visit note. 

## 2017-01-23 NOTE — Progress Notes (Signed)
Subjective:    Patient ID: Leslie Bryant, female    DOB: 12-Oct-1941, 76 y.o.   MRN: [id number]  HPI   Here with 2-3 days acute onset fever, bilat ear pain and pressure, headache, general weakness and malaise, and greenish d/c, with mild ST and non prod cough, but pt denies chest pain, wheezing, increased sob or doe, orthopnea, PND, increased LE swelling, palpitations, dizziness or syncope.  Lost 7 lbs so far with metformin  Pt denies polydipsia, polyuria.  Due for right knee surgury soon after easter holiday Past Medical History:  Diagnosis Date  . Acute meniscal tear of knee LEFT KNEE  . Arthritis KNEES  . 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 Cancer [facility])    breast cancer right  . Diverticulitis of large intestine with perforation 11/02/2011   Microperforation probably related to nut and popcorn consumption.  Resolved by CT scan Recurrent episode clinical dx 07/2012   . Diverticulosis of colon    sigmoid  . GERD (gastroesophageal reflux disease)   . H/O hiatal hernia   . Hypercholesterolemia   . Left knee pain   . Palpitations IRREGULAR HEARTBEAT--  CONTROLLED W/  BETA BLOCKER  . Swelling of left knee joint    Past Surgical History:  Procedure Laterality Date  . BREAST RECONTRUCTION W/ IMPLANTS  1987  . CARDIAC CATHETERIZATION  12-22-2000  DR BRACKBILL   NORMAL LVF/ NORMAL CORONARY ARTERIES  . CHOLECYSTECTOMY  1988  . CHONDROPLASTY  09/08/2012   Procedure: CHONDROPLASTY;  Surgeon: Tobi Bastos, MD;  Location: [facility];  Service: Orthopedics;  Laterality: Left;  . COLONOSCOPY     last colon 05-03-2009  . KNEE ARTHROSCOPY  2011   RIGHT KNEE  . KNEE ARTHROSCOPY WITH MEDIAL MENISECTOMY  09/08/2012   Procedure: KNEE ARTHROSCOPY WITH MEDIAL MENISECTOMY;  Surgeon: Tobi Bastos, MD;  Location: [facility];  Service: Orthopedics;  Laterality: Left;  lateral tibial plateau,   . MASTECTOMY  1986   RIGHT BREAST W/ MULTIPLE NODE DISSECTION  .  POLYPECTOMY    . TOTAL KNEE ARTHROPLASTY Left 07/21/2013   Procedure: TOTAL KNEE ARTHROPLASTY;  Surgeon: Newt Minion, MD;  Location: [facility];  Service: Orthopedics;  Laterality: Left;  Left Total Knee Arthroplasty  . VAGINAL HYSTERECTOMY  1966    reports that she has never smoked. She has never used smokeless tobacco. She reports that she drinks about 0.6 oz of alcohol per week . She reports that she does not use drugs. family history includes Brain cancer in her sister; Cancer in her brother; Colon cancer in her brother; Heart attack in her father; Heart failure in her sister; Leukemia in her brother; Lung cancer in her sister. Allergies  Allergen Reactions  . Crestor [Rosuvastatin Calcium] Other (See Comments)    Shoulder pain  . Lipitor [Atorvastatin Calcium] Other (See Comments)    Myalgias   . Pravastatin Other (See Comments)    myalgias  . Penicillins Itching   Current Outpatient Prescriptions on File Prior to Visit  Medication Sig Dispense Refill  . ALPRAZolam (XANAX) 0.25 MG tablet Take 0.25 mg by mouth at bedtime as needed for anxiety (additional refills need to come from PCP).    Marland Kitchen aspirin 81 MG tablet Take 81 mg by mouth 2 (two) times daily.    Marland Kitchen BIOTIN 5000 PO Take 1 capsule by mouth daily.    Marland Kitchen co-enzyme Q-10 30 MG capsule Take 30 mg by mouth  2 (two) times daily.     Marland Kitchen ezetimibe (ZETIA) 10 MG tablet Take 1 tablet (10 mg total) by mouth daily. 30 tablet 8  . fish oil-omega-3 fatty acids 1000 MG capsule Take 1 g by mouth 2 (two) times daily.    . furosemide (LASIX) 20 MG tablet One tablet daily by mouth as needed for swelling 30 tablet 3  . Magnesium 400 MG CAPS Take 400 mg by mouth daily.    . metFORMIN (GLUCOPHAGE) 500 MG tablet Take 1 tablet (500 mg total) by mouth daily with breakfast. 90 tablet 3  . metoprolol (LOPRESSOR) 50 MG tablet TAKE 1 TABLET BY MOUTH TWICE DAILY 180 tablet 3  . Multiple Vitamins-Minerals (CENTRUM SILVER PO) Take 1 tablet by mouth every morning.       Marland Kitchen omeprazole (PRILOSEC) 40 MG capsule Take 1 capsule (40 mg total) by mouth daily. 90 capsule 0   No current facility-administered medications on file prior to visit.    Review of Systems  All other system neg per pt    Objective:   Physical Exam BP 130/72   Pulse 83   Temp 98.2 F (36.8 C) (Oral)   Resp 16   Wt 162 lb (73.5 kg)   SpO2 98%   BMI 30.11 kg/m  VS noted,  Constitutional: Pt appears in no apparent distress HENT: Head: NCAT.  Right Ear: External ear normal.  Left Ear: External ear normal.  Eyes: . Pupils are equal, round, and reactive to light. Conjunctivae and EOM are normal Bilat tm's with severe erythema right > left.  Max sinus areas mild tender.  Pharynx with mild erythema, no exudate Neck: Normal range of motion. Neck supple.  Cardiovascular: Normal rate and regular rhythm.   Pulmonary/Chest: Effort normal and breath sounds without rales or wheezing.  Neurological: Pt is alert. Not confused , motor grossly intact Skin: Skin is warm. No rash, no LE edema Psychiatric: Pt behavior is normal. No agitation.  No other exam findings    Assessment & Plan:

## 2017-01-24 ENCOUNTER — Encounter (INDEPENDENT_AMBULATORY_CARE_PROVIDER_SITE_OTHER): Payer: Self-pay | Admitting: Orthopedic Surgery

## 2017-01-24 ENCOUNTER — Other Ambulatory Visit (INDEPENDENT_AMBULATORY_CARE_PROVIDER_SITE_OTHER): Payer: Self-pay | Admitting: Family

## 2017-01-25 NOTE — Assessment & Plan Note (Signed)
Mild to mod, for antibx course,  to f/u any worsening symptoms or concerns 

## 2017-01-25 NOTE — Assessment & Plan Note (Signed)
stable overall by history and exam, recent data reviewed with pt, and pt to continue medical treatment as before,  to f/u any worsening symptoms or concerns Lab Results  Component Value Date   HGBA1C 6.8 (H) 11/06/2016

## 2017-01-27 ENCOUNTER — Ambulatory Visit (INDEPENDENT_AMBULATORY_CARE_PROVIDER_SITE_OTHER): Payer: Medicare Other | Admitting: Family

## 2017-01-27 ENCOUNTER — Ambulatory Visit (INDEPENDENT_AMBULATORY_CARE_PROVIDER_SITE_OTHER)
Admission: RE | Admit: 2017-01-27 | Discharge: 2017-01-27 | Disposition: A | Discharge status: Home / Self Care | Payer: Medicare Other | Source: Ambulatory Visit | Attending: Family | Admitting: Family

## 2017-01-27 ENCOUNTER — Encounter: Payer: Self-pay | Admitting: Family

## 2017-01-27 VITALS — BP 150/82 | HR 82 | Temp 98.4°F | Resp 16 | Ht 61.5 in | Wt 159.0 lb

## 2017-01-27 DIAGNOSIS — M9902 Segmental and somatic dysfunction of thoracic region: Secondary | ICD-10-CM | POA: Diagnosis not present

## 2017-01-27 DIAGNOSIS — R109 Unspecified abdominal pain: Secondary | ICD-10-CM | POA: Diagnosis not present

## 2017-01-27 DIAGNOSIS — R1030 Lower abdominal pain, unspecified: Secondary | ICD-10-CM | POA: Diagnosis not present

## 2017-01-27 DIAGNOSIS — M546 Pain in thoracic spine: Secondary | ICD-10-CM | POA: Diagnosis not present

## 2017-01-27 DIAGNOSIS — M9901 Segmental and somatic dysfunction of cervical region: Secondary | ICD-10-CM | POA: Diagnosis not present

## 2017-01-27 DIAGNOSIS — M5413 Radiculopathy, cervicothoracic region: Secondary | ICD-10-CM | POA: Diagnosis not present

## 2017-01-27 MED ORDER — METRONIDAZOLE 500 MG PO TABS: 500.0000 mg | ORAL_TABLET | Freq: Three times a day (TID) | ORAL | 0 refills | Status: DC

## 2017-01-27 NOTE — Progress Notes (Signed)
Subjective:    Patient ID: Leslie Bryant, female    DOB: 01-Oct-1941, 76 y.o.   MRN: [id number]  Chief Complaint  Patient presents with  . Abdominal Pain    having lower abdominal pain, thinks it is diverticulitis, has alot of gas    HPI:  Leslie Bryant is a 76 y.o. female who  has a past medical history of Acute meniscal tear of knee (LEFT KNEE); Arthritis (KNEES); Bright's disease; Cancer ([place]); Diverticulitis of large intestine with perforation (11/02/2011); Diverticulosis of colon; GERD (gastroesophageal reflux disease); H/O hiatal hernia; Hypercholesterolemia; Left knee pain; Palpitations (IRREGULAR HEARTBEAT--  CONTROLLED W/  BETA BLOCKER); and Swelling of left knee joint. and presents today for an acute office visit.   This is a new problem. Associated symptom of pain located in her lower abdomen has been going on for about 3-4 days. There was some improvement over the course of the last couple of days. Notes that the last time she had a similar problem it was with diverticulitis and put her in the hospital. Pains are located across the lower abdomen and described as sharp. Feels like she has to pass gas but sometimes not able. She has had some constipation but now has some diarrhea which may be attributed to her current antibiotic of levofloxacin. Reports no straining. No nausea or vomiting. Denies any intake of nuts or seeds, or other potential inflammatory foods. No blood in stool. She has been reducing her nutritional intake.   Allergies  Allergen Reactions  . Crestor [Rosuvastatin Calcium] Other (See Comments)    Shoulder pain  . Lipitor [Atorvastatin Calcium] Other (See Comments)    Myalgias   . Pravastatin Other (See Comments)    myalgias  . Penicillins Itching      Outpatient Medications Prior to Visit  Medication Sig Dispense Refill  . ALPRAZolam (XANAX) 0.25 MG tablet Take 0.25 mg by mouth at bedtime as needed for anxiety (additional refills need to come from PCP).      Marland Kitchen aspirin 81 MG tablet Take 81 mg by mouth 2 (two) times daily.    Marland Kitchen BIOTIN 5000 PO Take 1 capsule by mouth daily.    Marland Kitchen co-enzyme Q-10 30 MG capsule Take 30 mg by mouth 2 (two) times daily.     Marland Kitchen ezetimibe (ZETIA) 10 MG tablet Take 1 tablet (10 mg total) by mouth daily. 30 tablet 8  . fish oil-omega-3 fatty acids 1000 MG capsule Take 1 g by mouth 2 (two) times daily.    . furosemide (LASIX) 20 MG tablet One tablet daily by mouth as needed for swelling 30 tablet 3  . Magnesium 400 MG CAPS Take 400 mg by mouth daily.    . metFORMIN (GLUCOPHAGE) 500 MG tablet Take 1 tablet (500 mg total) by mouth daily with breakfast. 90 tablet 3  . metoprolol (LOPRESSOR) 50 MG tablet TAKE 1 TABLET BY MOUTH TWICE DAILY 180 tablet 3  . Multiple Vitamins-Minerals (CENTRUM SILVER PO) Take 1 tablet by mouth every morning.     Marland Kitchen omeprazole (PRILOSEC) 40 MG capsule Take 1 capsule (40 mg total) by mouth daily. 90 capsule 0  . levofloxacin (LEVAQUIN) 250 MG tablet Take 1 tablet (250 mg total) by mouth daily. 10 tablet 0   No facility-administered medications prior to visit.       Past Surgical History:  Procedure Laterality Date  . BREAST RECONTRUCTION W/ IMPLANTS  1987  . CARDIAC CATHETERIZATION  12-22-2000  DR BRACKBILL   NORMAL  LVF/ NORMAL CORONARY ARTERIES  . CHOLECYSTECTOMY  1988  . CHONDROPLASTY  09/08/2012   Procedure: CHONDROPLASTY;  Surgeon: Tobi Bastos, MD;  Location: [facility];  Service: Orthopedics;  Laterality: Left;  . COLONOSCOPY     last colon 05-03-2009  . KNEE ARTHROSCOPY  2011   RIGHT KNEE  . KNEE ARTHROSCOPY WITH MEDIAL MENISECTOMY  09/08/2012   Procedure: KNEE ARTHROSCOPY WITH MEDIAL MENISECTOMY;  Surgeon: Tobi Bastos, MD;  Location: [facility];  Service: Orthopedics;  Laterality: Left;  lateral tibial plateau,   . MASTECTOMY  1986   RIGHT BREAST W/ MULTIPLE NODE DISSECTION  . POLYPECTOMY    . TOTAL KNEE ARTHROPLASTY Left 07/21/2013   Procedure:  TOTAL KNEE ARTHROPLASTY;  Surgeon: Newt Minion, MD;  Location: [facility];  Service: Orthopedics;  Laterality: Left;  Left Total Knee Arthroplasty  . VAGINAL HYSTERECTOMY  1966      Past Medical History:  Diagnosis Date  . Acute meniscal tear of knee LEFT KNEE  . Arthritis KNEES  . 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 Cancer [facility])    breast cancer right  . Diverticulitis of large intestine with perforation 11/02/2011   Microperforation probably related to nut and popcorn consumption.  Resolved by CT scan Recurrent episode clinical dx 07/2012   . Diverticulosis of colon    sigmoid  . GERD (gastroesophageal reflux disease)   . H/O hiatal hernia   . Hypercholesterolemia   . Left knee pain   . Palpitations IRREGULAR HEARTBEAT--  CONTROLLED W/  BETA BLOCKER  . Swelling of left knee joint       Review of Systems  Constitutional: Negative for chills and fever.  Respiratory: Negative for chest tightness and shortness of breath.   Cardiovascular: Negative for chest pain and palpitations.  Gastrointestinal: Positive for abdominal pain and constipation. Negative for blood in stool, diarrhea, nausea, rectal pain and vomiting.  Neurological: Negative for dizziness.      Objective:    BP (!) 150/82 (BP Location: Left Arm, Patient Position: Sitting, Cuff Size: Normal)   Pulse 82   Temp 98.4 F (36.9 C) (Oral)   Resp 16   Ht 5' 1.5" (1.562 m)   Wt 159 lb (72.1 kg)   SpO2 96%   BMI 29.56 kg/m  Nursing note and vital signs reviewed.  Physical Exam  Constitutional: She is oriented to person, place, and time. She appears well-developed and well-nourished. No distress.  Cardiovascular: Normal rate, regular rhythm, normal heart sounds and intact distal pulses.   Pulmonary/Chest: Effort normal and breath sounds normal.  Abdominal: Normal appearance. She exhibits no mass. There is no hepatosplenomegaly. There is tenderness in the right lower quadrant, suprapubic area, left upper quadrant and  left lower quadrant. There is no rigidity, no rebound, no guarding, no tenderness at McBurney's point and negative Murphy's sign.  Neurological: She is alert and oriented to person, place, and time.  Skin: Skin is warm and dry.  Psychiatric: She has a normal mood and affect. Her behavior is normal. Judgment and thought content normal.       Assessment & Plan:   Problem List Items Addressed This Visit      Other   Lower abdominal pain - Primary    Symptoms concerning for possible diverticulitis or constipation. Obtain x-rays to rule out constipation/obstruction. Does not appear consistent with irritable bowel syndrome. Start metronidazole addition to previously prescribed levofloxacin. Advised to decrease diet to low  fiber diet and progress as tolerated. Follow-up if symptoms worsen or do not improve.      Relevant Orders   DG Abd 2 Views       I have discontinued Ms. Lascano's levofloxacin. I am also having her start on metroNIDAZOLE. Additionally, I am having her maintain her co-enzyme Q-10, Multiple Vitamins-Minerals (CENTRUM SILVER PO), fish oil-omega-3 fatty acids, aspirin, BIOTIN 5000 PO, Magnesium, furosemide, ALPRAZolam, metoprolol, ezetimibe, metFORMIN, and omeprazole.   Meds ordered this encounter  Medications  . metroNIDAZOLE (FLAGYL) 500 MG tablet    Sig: Take 1 tablet (500 mg total) by mouth 3 (three) times daily.    Dispense:  21 tablet    Refill:  0    Order Specific Question:   Supervising Provider    Answer:   Pricilla Holm A [[id number]]     Follow-up: Return if symptoms worsen or fail to improve.  Mauricio Po, FNP

## 2017-01-27 NOTE — Assessment & Plan Note (Addendum)
Symptoms concerning for possible diverticulitis or constipation. Obtain x-rays to rule out constipation/obstruction. Does not appear consistent with irritable bowel syndrome. Start metronidazole addition to previously prescribed levofloxacin. Advised to decrease diet to low fiber diet and progress as tolerated. Follow-up if symptoms worsen or do not improve.

## 2017-01-27 NOTE — Patient Instructions (Signed)
Thank you for choosing [facility].  SUMMARY AND INSTRUCTIONS:  Recommend a low fiber diet and progress as tolerated.  Medication:  Start the metronidazole with the levofloxacin.   Your prescription(s) have been submitted to your pharmacy or been printed and provided for you. Please take as directed and contact our office if you believe you are having problem(s) with the medication(s) or have any questions.  Imaging / Radiology:  Please stop by radiology on the basement level of the building for your x-rays. Your results will be released to [facility] (or called to you) after review, usually within 72 hours after test completion. If any treatments or changes are necessary, you will be notified at that same time.  Follow up:  If your symptoms worsen or fail to improve, please contact our office for further instruction, or in case of emergency go directly to the emergency room at the closest medical facility.

## 2017-01-30 DIAGNOSIS — M9902 Segmental and somatic dysfunction of thoracic region: Secondary | ICD-10-CM | POA: Diagnosis not present

## 2017-01-30 DIAGNOSIS — M546 Pain in thoracic spine: Secondary | ICD-10-CM | POA: Diagnosis not present

## 2017-01-30 DIAGNOSIS — M5413 Radiculopathy, cervicothoracic region: Secondary | ICD-10-CM | POA: Diagnosis not present

## 2017-01-30 DIAGNOSIS — M9901 Segmental and somatic dysfunction of cervical region: Secondary | ICD-10-CM | POA: Diagnosis not present

## 2017-02-07 DIAGNOSIS — M5413 Radiculopathy, cervicothoracic region: Secondary | ICD-10-CM | POA: Diagnosis not present

## 2017-02-07 DIAGNOSIS — M546 Pain in thoracic spine: Secondary | ICD-10-CM | POA: Diagnosis not present

## 2017-02-07 DIAGNOSIS — M9902 Segmental and somatic dysfunction of thoracic region: Secondary | ICD-10-CM | POA: Diagnosis not present

## 2017-02-07 DIAGNOSIS — M9901 Segmental and somatic dysfunction of cervical region: Secondary | ICD-10-CM | POA: Diagnosis not present

## 2017-02-09 NOTE — Pre-Procedure Instructions (Addendum)
Chrislyn MEYA CLUTTER  02/09/2017      [place] [place] [address] [place] [address] Phone: [phone] Fax: [phone]  [id number] - [place], [place] - [address] [place] [address] Phone: [phone] Fax: [phone]    Your procedure is scheduled on Wed, April 18 @ 8:30 AM  Report to [place] at 6:30 AM  Call this number if you have problems the morning of surgery:  [phone]   Remember:  Do not eat food or drink liquids after midnight.  Take these medicines the morning of surgery with A SIP OF WATER Metoprolol(Lopressor) and Omeprazole(Prilosec)             Stop taking your Ibuprofen,Aspirin,CO Q10,Fish Oil along with any Vitamins or Herbal Medications. No Goody's,BC's,Aleve, Advil,or Motrin.   WHAT DO I DO ABOUT MY DIABETES MEDICATION?   Marland Kitchen Do not take oral diabetes medicines (pills) the morning of surgery. metFORMIN (GLUCOPHAGE)   How to Manage Your Diabetes Before and After Surgery  Why is it important to control my blood sugar before and after surgery? . Improving blood sugar levels before and after surgery helps healing and can limit problems. . A way of improving blood sugar control is eating a healthy diet by: o  Eating less sugar and carbohydrates o  Increasing activity/exercise o  Talking with your doctor about reaching your blood sugar goals . High blood sugars (greater than 180 mg/dL) can raise your risk of infections and slow your recovery, so you will need to focus on controlling your diabetes during the weeks before surgery. . Make sure that the doctor who takes care of your diabetes knows about your planned surgery including the date and location.  How do I manage my blood sugar before surgery? . Check your blood sugar at least 4 times a day, starting 2 days before surgery, to make sure that the  level is not too high or low. o Check your blood sugar the morning of your surgery when you wake up and every 2 hours until you get to the Short Stay unit. . If your blood sugar is less than 70 mg/dL, you will need to treat for low blood sugar: o Do not take insulin. o Treat a low blood sugar (less than 70 mg/dL) with  cup of clear juice (cranberry or apple), 4 glucose tablets, OR glucose gel. o Recheck blood sugar in 15 minutes after treatment (to make sure it is greater than 70 mg/dL). If your blood sugar is not greater than 70 mg/dL on recheck, call [phone] for further instructions. . Report your blood sugar to the short stay nurse when you get to Short Stay.  . If you are admitted to the hospital after surgery: o Your blood sugar will be checked by the staff and you will probably be given insulin after surgery (instead of oral diabetes medicines) to make sure you have good blood sugar levels. o The goal for blood sugar control after surgery is 80-180 mg/dL.    Do not wear jewelry, make-up or nail polish.  Do not wear lotions, powders, perfumes, or deoderant.  Do not shave 48 hours prior to surgery.    Do not bring valuables to the hospital.  [facility] is not responsible for any belongings or valuables.  Contacts, dentures or bridgework may not be worn into surgery.  Leave your  suitcase in the car.  After surgery it may be brought to your room.  For patients admitted to the hospital, discharge time will be determined by your treatment team.  Patients discharged the day of surgery will not be allowed to drive home.    Special instructions:   [place] - Preparing for Surgery  Before surgery, you can play an important role.  Because skin is not sterile, your skin needs to be as free of germs as possible.  You can reduce the number of germs on you skin by washing with CHG (chlorahexidine gluconate) soap before surgery.  CHG is an antiseptic cleaner which kills germs and bonds  with the skin to continue killing germs even after washing.  Please DO NOT use if you have an allergy to CHG or antibacterial soaps.  If your skin becomes reddened/irritated stop using the CHG and inform your nurse when you arrive at Short Stay.  Do not shave (including legs and underarms) for at least 48 hours prior to the first CHG shower.  You may shave your face.  Please follow these instructions carefully:  [place]- Preparing For Surgery  Before surgery, you can play an important role. Because skin is not sterile, your skin needs to be as free of germs as possible. You can reduce the number of germs on your skin by washing with CHG (chlorahexidine gluconate) Soap before surgery.  CHG is an antiseptic cleaner which kills germs and bonds with the skin to continue killing germs even after washing.  Please do not use if you have an allergy to CHG or antibacterial soaps. If your skin becomes reddened/irritated stop using the CHG.  Do not shave (including legs and underarms) for at least 48 hours prior to first CHG shower. It is OK to shave your face.  Please follow these instructions carefully.   1. Shower the NIGHT BEFORE SURGERY and the MORNING OF SURGERY with CHG.   2. If you chose to wash your hair, wash your hair first as usual with your normal shampoo.  3. After you shampoo, rinse your hair and body thoroughly to remove the shampoo.  4. Use CHG as you would any other liquid soap. You can apply CHG directly to the skin and wash gently with a scrungie or a clean washcloth.   5. Apply the CHG Soap to your body ONLY FROM THE NECK DOWN.  Do not use on open wounds or open sores. Avoid contact with your eyes, ears, mouth and genitals (private parts). Wash genitals (private parts) with your normal soap.  6. Wash thoroughly, paying special attention to the area where your surgery will be performed.  7. Thoroughly rinse your body with warm water from the neck down.  8. DO NOT  shower/wash with your normal soap after using and rinsing off the CHG Soap.  9. Pat yourself dry with a CLEAN TOWEL.   10. Wear CLEAN PAJAMAS   11. Place CLEAN SHEETS on your bed the night of your first shower and DO NOT SLEEP WITH PETS.    Day of Surgery: Do not apply any deodorants/lotions. Please wear clean clothes to the hospital/surgery center.      Please read over the following fact sheets that you were given. Pain Booklet, Coughing and Deep Breathing, MRSA Information and Surgical Site Infection Prevention

## 2017-02-10 ENCOUNTER — Encounter (HOSPITAL_COMMUNITY): Payer: Self-pay

## 2017-02-10 ENCOUNTER — Encounter (HOSPITAL_COMMUNITY)
Admission: RE | Admit: 2017-02-10 | Discharge: 2017-02-10 | Disposition: A | Discharge status: Home / Self Care | Payer: Medicare Other | Source: Ambulatory Visit | Attending: Orthopedic Surgery | Admitting: Orthopedic Surgery

## 2017-02-10 DIAGNOSIS — M1711 Unilateral primary osteoarthritis, right knee: Secondary | ICD-10-CM | POA: Diagnosis not present

## 2017-02-10 DIAGNOSIS — Z01818 Encounter for other preprocedural examination: Secondary | ICD-10-CM | POA: Diagnosis present

## 2017-02-10 HISTORY — DX: Prediabetes: R73.03

## 2017-02-10 HISTORY — DX: Pneumonia, unspecified organism: J18.9

## 2017-02-10 LAB — CBC
HEMATOCRIT: 34.9 % — AB (ref 36.0–46.0)
Hemoglobin: 11.6 g/dL — ABNORMAL LOW (ref 12.0–15.0)
MCH: 30.4 pg (ref 26.0–34.0)
MCHC: 33.2 g/dL (ref 30.0–36.0)
MCV: 91.6 fL (ref 78.0–100.0)
Platelets: 317 10*3/uL (ref 150–400)
RBC: 3.81 MIL/uL — ABNORMAL LOW (ref 3.87–5.11)
RDW: 12.8 % (ref 11.5–15.5)
WBC: 7 10*3/uL (ref 4.0–10.5)

## 2017-02-10 LAB — BASIC METABOLIC PANEL
ANION GAP: 8 (ref 5–15)
BUN: 8 mg/dL (ref 6–20)
CHLORIDE: 103 mmol/L (ref 101–111)
CO2: 24 mmol/L (ref 22–32)
Calcium: 9.3 mg/dL (ref 8.9–10.3)
Creatinine, Ser: 0.69 mg/dL (ref 0.44–1.00)
GFR calc Af Amer: >60 mL/min (ref >60)
GFR calc non Af Amer: >60 mL/min (ref >60)
Glucose, Bld: 113 mg/dL — ABNORMAL HIGH (ref 65–99)
POTASSIUM: 4 mmol/L (ref 3.5–5.1)
Sodium: 135 mmol/L (ref 135–145)

## 2017-02-10 LAB — SURGICAL PCR SCREEN
MRSA, PCR: NEGATIVE
Staphylococcus aureus: POSITIVE — AB

## 2017-02-10 NOTE — Progress Notes (Signed)
PCP - [name]  Cardiologist - Hilty  Chest x-ray - 11/06/16 EKG - 12/24/16 Stress Test - denies ECHO - denies Cardiac Cath - +20 years ago   Borderline Diabetic - PCP is monitoring A1C Fasting Blood Sugar - does not check  Checks Blood Sugar __0___ times a day   Pending A1C will go to [facility] if elevated  Patient denies shortness of breath, fever, cough and chest pain at PAT appointment   Patient verbalized understanding of instructions that was given to them at the PAT appointment. Patient expressed that there were no further questions.  Patient was also instructed that they will need to review over the PAT instructions again at home before the surgery.

## 2017-02-10 NOTE — Progress Notes (Signed)
script called in at [facility] in [place], patient verbalized understanding of instructions

## 2017-02-11 LAB — HEMOGLOBIN A1C
HEMOGLOBIN A1C: 6.2 % — AB (ref 4.8–5.6)
MEAN PLASMA GLUCOSE: 131 mg/dL

## 2017-02-14 ENCOUNTER — Other Ambulatory Visit (INDEPENDENT_AMBULATORY_CARE_PROVIDER_SITE_OTHER): Payer: Self-pay | Admitting: Orthopedic Surgery

## 2017-02-18 ENCOUNTER — Encounter (HOSPITAL_COMMUNITY): Payer: Self-pay | Admitting: Anesthesiology

## 2017-02-18 NOTE — Anesthesia Preprocedure Evaluation (Addendum)
Anesthesia Evaluation  Patient identified by MRN, date of birth, ID band Patient awake    Reviewed: Allergy & Precautions, NPO status , Patient's Chart, lab work & pertinent test results  Airway Mallampati: I  TM Distance: >3 FB Neck ROM: Full    Dental  (+) Teeth Intact, Dental Advisory Given   Pulmonary neg pulmonary ROS,    breath sounds clear to auscultation       Cardiovascular hypertension, Pt. on home beta blockers  Rhythm:Regular Rate:Normal     Neuro/Psych Anxiety negative neurological ROS     GI/Hepatic Neg liver ROS, hiatal hernia, GERD  Medicated,  Endo/Other  diabetes, Type 2, Oral Hypoglycemic Agents  Renal/GU Renal disease  negative genitourinary   Musculoskeletal  (+) Arthritis , Osteoarthritis,    Abdominal   Peds negative pediatric ROS (+)  Hematology negative hematology ROS (+)   Anesthesia Other Findings Day of surgery medications reviewed with the patient. - HLD  Reproductive/Obstetrics negative OB ROS                            Lab Results  Component Value Date   WBC 7.0 02/10/2017   HGB 11.6 (L) 02/10/2017   HCT 34.9 (L) 02/10/2017   MCV 91.6 02/10/2017   PLT 317 02/10/2017   Lab Results  Component Value Date   CREATININE 0.69 02/10/2017   BUN 8 02/10/2017   NA 135 02/10/2017   K 4.0 02/10/2017   CL 103 02/10/2017   CO2 24 02/10/2017   Lab Results  Component Value Date   INR 1.01 07/13/2013   INR 1.03 11/07/2011   EKG: NSR  Anesthesia Physical Anesthesia Plan  ASA: II  Anesthesia Plan: Spinal   Post-op Pain Management:  Regional for Post-op pain   Induction: Intravenous  Airway Management Planned: Natural Airway  Additional Equipment:   Intra-op Plan:   Post-operative Plan:   Informed Consent: I have reviewed the patients History and Physical, chart, labs and discussed the procedure including the risks, benefits and alternatives for  the proposed anesthesia with the patient or authorized representative who has indicated his/her understanding and acceptance.     Plan Discussed with: CRNA  Anesthesia Plan Comments:        Anesthesia Quick Evaluation

## 2017-02-19 ENCOUNTER — Encounter (HOSPITAL_COMMUNITY): Payer: Self-pay | Admitting: General Practice

## 2017-02-19 ENCOUNTER — Encounter (HOSPITAL_COMMUNITY)
Admission: RE | Disposition: A | Discharge status: Home Health | Payer: Self-pay | Source: Ambulatory Visit | Attending: Orthopedic Surgery

## 2017-02-19 ENCOUNTER — Inpatient Hospital Stay (HOSPITAL_COMMUNITY)
Admission: RE | Admit: 2017-02-19 | Discharge: 2017-02-21 | DRG: 470 | Disposition: A | Discharge status: Home Health | Payer: Medicare Other | Source: Ambulatory Visit | Attending: Orthopedic Surgery | Admitting: Orthopedic Surgery

## 2017-02-19 ENCOUNTER — Inpatient Hospital Stay (HOSPITAL_COMMUNITY): Payer: Medicare Other | Admitting: Anesthesiology

## 2017-02-19 DIAGNOSIS — M1711 Unilateral primary osteoarthritis, right knee: Principal | ICD-10-CM | POA: Diagnosis present

## 2017-02-19 DIAGNOSIS — Z7984 Long term (current) use of oral hypoglycemic drugs: Secondary | ICD-10-CM | POA: Diagnosis not present

## 2017-02-19 DIAGNOSIS — Z7982 Long term (current) use of aspirin: Secondary | ICD-10-CM

## 2017-02-19 DIAGNOSIS — Z853 Personal history of malignant neoplasm of breast: Secondary | ICD-10-CM

## 2017-02-19 DIAGNOSIS — M8580 Other specified disorders of bone density and structure, unspecified site: Secondary | ICD-10-CM | POA: Diagnosis present

## 2017-02-19 DIAGNOSIS — Z96651 Presence of right artificial knee joint: Secondary | ICD-10-CM

## 2017-02-19 DIAGNOSIS — Z880 Allergy status to penicillin: Secondary | ICD-10-CM

## 2017-02-19 DIAGNOSIS — E119 Type 2 diabetes mellitus without complications: Secondary | ICD-10-CM | POA: Diagnosis not present

## 2017-02-19 DIAGNOSIS — E785 Hyperlipidemia, unspecified: Secondary | ICD-10-CM | POA: Diagnosis not present

## 2017-02-19 DIAGNOSIS — Z888 Allergy status to other drugs, medicaments and biological substances status: Secondary | ICD-10-CM

## 2017-02-19 DIAGNOSIS — G8918 Other acute postprocedural pain: Secondary | ICD-10-CM | POA: Diagnosis not present

## 2017-02-19 DIAGNOSIS — Z79899 Other long term (current) drug therapy: Secondary | ICD-10-CM | POA: Diagnosis not present

## 2017-02-19 DIAGNOSIS — I10 Essential (primary) hypertension: Secondary | ICD-10-CM | POA: Diagnosis not present

## 2017-02-19 DIAGNOSIS — K219 Gastro-esophageal reflux disease without esophagitis: Secondary | ICD-10-CM | POA: Diagnosis present

## 2017-02-19 HISTORY — DX: Malignant neoplasm of unspecified site of right female breast: C50.911

## 2017-02-19 HISTORY — DX: Personal history of urinary calculi: Z87.442

## 2017-02-19 HISTORY — PX: TOTAL KNEE ARTHROPLASTY: SHX125

## 2017-02-19 LAB — GLUCOSE, CAPILLARY
GLUCOSE-CAPILLARY: 104 mg/dL — AB (ref 65–99)
GLUCOSE-CAPILLARY: 190 mg/dL — AB (ref 65–99)
Glucose-Capillary: 108 mg/dL — ABNORMAL HIGH (ref 65–99)

## 2017-02-19 SURGERY — ARTHROPLASTY, KNEE, TOTAL
Anesthesia: Spinal | Site: Knee | Laterality: Right

## 2017-02-19 MED ORDER — 0.9 % SODIUM CHLORIDE (POUR BTL) OPTIME
TOPICAL | Status: DC | PRN
  Administered 2017-02-19: 1000 mL

## 2017-02-19 MED ORDER — ACETAMINOPHEN 650 MG RE SUPP: 650.0000 mg | Freq: Four times a day (QID) | RECTAL | Status: DC | PRN

## 2017-02-19 MED ORDER — CHLORHEXIDINE GLUCONATE 4 % EX LIQD: 60.0000 mL | Freq: Once | CUTANEOUS | Status: DC

## 2017-02-19 MED ORDER — SODIUM CHLORIDE 0.9 % IR SOLN
Status: DC | PRN
  Administered 2017-02-19: 3000 mL

## 2017-02-19 MED ORDER — METHOCARBAMOL 1000 MG/10ML IJ SOLN
500.0000 mg | Freq: Four times a day (QID) | INTRAVENOUS | Status: DC | PRN
  Filled 2017-02-19: qty 5

## 2017-02-19 MED ORDER — POLYETHYLENE GLYCOL 3350 17 G PO PACK: 17.0000 g | PACK | Freq: Every day | ORAL | Status: DC | PRN

## 2017-02-19 MED ORDER — ACETAMINOPHEN 325 MG PO TABS
650.0000 mg | ORAL_TABLET | Freq: Four times a day (QID) | ORAL | Status: DC | PRN
  Administered 2017-02-19 – 2017-02-20 (×2): 650 mg via ORAL
  Filled 2017-02-19 (×2): qty 2

## 2017-02-19 MED ORDER — PROPOFOL 10 MG/ML IV BOLUS
INTRAVENOUS | Status: AC
  Filled 2017-02-19: qty 40

## 2017-02-19 MED ORDER — ASPIRIN EC 325 MG PO TBEC
325.0000 mg | DELAYED_RELEASE_TABLET | Freq: Every day | ORAL | Status: DC
  Administered 2017-02-20 – 2017-02-21 (×2): 325 mg via ORAL
  Filled 2017-02-19 (×2): qty 1

## 2017-02-19 MED ORDER — PHENYLEPHRINE 40 MCG/ML (10ML) SYRINGE FOR IV PUSH (FOR BLOOD PRESSURE SUPPORT)
PREFILLED_SYRINGE | INTRAVENOUS | Status: AC
  Filled 2017-02-19: qty 10

## 2017-02-19 MED ORDER — INSULIN ASPART 100 UNIT/ML ~~LOC~~ SOLN: 0.0000 [IU] | Freq: Three times a day (TID) | SUBCUTANEOUS | Status: DC

## 2017-02-19 MED ORDER — TRANEXAMIC ACID 1000 MG/10ML IV SOLN
2000.0000 mg | Freq: Once | INTRAVENOUS | Status: AC
  Administered 2017-02-19: 2000 mg via TOPICAL
  Filled 2017-02-19: qty 20

## 2017-02-19 MED ORDER — LACTATED RINGERS IV SOLN
INTRAVENOUS | Status: DC | PRN
  Administered 2017-02-19: 08:00:00 via INTRAVENOUS

## 2017-02-19 MED ORDER — FENTANYL CITRATE (PF) 250 MCG/5ML IJ SOLN
INTRAMUSCULAR | Status: AC
  Filled 2017-02-19: qty 5

## 2017-02-19 MED ORDER — CLINDAMYCIN PHOSPHATE 900 MG/50ML IV SOLN
900.0000 mg | INTRAVENOUS | Status: AC
  Administered 2017-02-19: 900 mg via INTRAVENOUS
  Filled 2017-02-19: qty 50

## 2017-02-19 MED ORDER — PHENYLEPHRINE HCL 10 MG/ML IJ SOLN
INTRAVENOUS | Status: DC | PRN
  Administered 2017-02-19: 50 ug/min via INTRAVENOUS

## 2017-02-19 MED ORDER — LACTATED RINGERS IV SOLN: INTRAVENOUS | Status: DC

## 2017-02-19 MED ORDER — LIDOCAINE 2% (20 MG/ML) 5 ML SYRINGE
INTRAMUSCULAR | Status: AC
  Filled 2017-02-19: qty 5

## 2017-02-19 MED ORDER — SODIUM CHLORIDE 0.9 % IV SOLN
INTRAVENOUS | Status: DC
  Administered 2017-02-19: 17:00:00 via INTRAVENOUS

## 2017-02-19 MED ORDER — BUPIVACAINE LIPOSOME 1.3 % IJ SUSP
20.0000 mL | Freq: Once | INTRAMUSCULAR | Status: AC
  Administered 2017-02-19: 15 mL
  Filled 2017-02-19: qty 20

## 2017-02-19 MED ORDER — DOCUSATE SODIUM 100 MG PO CAPS
100.0000 mg | ORAL_CAPSULE | Freq: Two times a day (BID) | ORAL | Status: DC
  Administered 2017-02-19 – 2017-02-21 (×4): 100 mg via ORAL
  Filled 2017-02-19 (×4): qty 1

## 2017-02-19 MED ORDER — METOPROLOL TARTRATE 50 MG PO TABS
50.0000 mg | ORAL_TABLET | Freq: Two times a day (BID) | ORAL | Status: DC
  Administered 2017-02-19 – 2017-02-21 (×4): 50 mg via ORAL
  Filled 2017-02-19 (×4): qty 1

## 2017-02-19 MED ORDER — MEPERIDINE HCL 25 MG/ML IJ SOLN: 6.2500 mg | INTRAMUSCULAR | Status: DC | PRN

## 2017-02-19 MED ORDER — PHENOL 1.4 % MT LIQD: 1.0000 | OROMUCOSAL | Status: DC | PRN

## 2017-02-19 MED ORDER — MIDAZOLAM HCL 2 MG/2ML IJ SOLN
INTRAMUSCULAR | Status: DC | PRN
  Administered 2017-02-19: 2 mg via INTRAVENOUS

## 2017-02-19 MED ORDER — MENTHOL 3 MG MT LOZG: 1.0000 | LOZENGE | OROMUCOSAL | Status: DC | PRN

## 2017-02-19 MED ORDER — BISACODYL 5 MG PO TBEC: 5.0000 mg | DELAYED_RELEASE_TABLET | Freq: Every day | ORAL | Status: DC | PRN

## 2017-02-19 MED ORDER — CLINDAMYCIN PHOSPHATE 600 MG/50ML IV SOLN
600.0000 mg | Freq: Four times a day (QID) | INTRAVENOUS | Status: AC
  Administered 2017-02-19 (×2): 600 mg via INTRAVENOUS
  Filled 2017-02-19 (×3): qty 50

## 2017-02-19 MED ORDER — PROMETHAZINE HCL 25 MG/ML IJ SOLN
6.2500 mg | INTRAMUSCULAR | Status: DC | PRN
Start: 2017-02-19 — End: 2017-02-19

## 2017-02-19 MED ORDER — METHOCARBAMOL 500 MG PO TABS
500.0000 mg | ORAL_TABLET | Freq: Four times a day (QID) | ORAL | Status: DC | PRN
  Administered 2017-02-19 – 2017-02-20 (×3): 500 mg via ORAL
  Filled 2017-02-19 (×3): qty 1

## 2017-02-19 MED ORDER — ONDANSETRON HCL 4 MG/2ML IJ SOLN: 4.0000 mg | Freq: Four times a day (QID) | INTRAMUSCULAR | Status: DC | PRN

## 2017-02-19 MED ORDER — ONDANSETRON HCL 4 MG PO TABS: 4.0000 mg | ORAL_TABLET | Freq: Four times a day (QID) | ORAL | Status: DC | PRN

## 2017-02-19 MED ORDER — FENTANYL CITRATE (PF) 250 MCG/5ML IJ SOLN
INTRAMUSCULAR | Status: DC | PRN
  Administered 2017-02-19: 25 ug via INTRAVENOUS

## 2017-02-19 MED ORDER — EZETIMIBE 10 MG PO TABS
10.0000 mg | ORAL_TABLET | Freq: Every day | ORAL | Status: DC
  Administered 2017-02-19 – 2017-02-21 (×3): 10 mg via ORAL
  Filled 2017-02-19 (×3): qty 1

## 2017-02-19 MED ORDER — PROPOFOL 10 MG/ML IV BOLUS
INTRAVENOUS | Status: DC | PRN
  Administered 2017-02-19: 20 mg via INTRAVENOUS

## 2017-02-19 MED ORDER — TRANEXAMIC ACID 1000 MG/10ML IV SOLN
1000.0000 mg | INTRAVENOUS | Status: AC
  Administered 2017-02-19: 1000 mg via INTRAVENOUS
  Filled 2017-02-19: qty 10

## 2017-02-19 MED ORDER — KETOROLAC TROMETHAMINE 15 MG/ML IJ SOLN
7.5000 mg | Freq: Four times a day (QID) | INTRAMUSCULAR | Status: AC
  Administered 2017-02-19: 15 mg via INTRAVENOUS
  Administered 2017-02-20 (×2): 7.5 mg via INTRAVENOUS
  Filled 2017-02-19 (×3): qty 1

## 2017-02-19 MED ORDER — MIDAZOLAM HCL 2 MG/2ML IJ SOLN
INTRAMUSCULAR | Status: AC
  Filled 2017-02-19: qty 2

## 2017-02-19 MED ORDER — HYDROMORPHONE HCL 1 MG/ML IJ SOLN
1.0000 mg | INTRAMUSCULAR | Status: DC | PRN
  Administered 2017-02-19 – 2017-02-21 (×4): 1 mg via INTRAVENOUS
  Filled 2017-02-19 (×4): qty 1

## 2017-02-19 MED ORDER — METFORMIN HCL 500 MG PO TABS
500.0000 mg | ORAL_TABLET | Freq: Every day | ORAL | Status: DC
  Administered 2017-02-20 – 2017-02-21 (×2): 500 mg via ORAL
  Filled 2017-02-19 (×2): qty 1

## 2017-02-19 MED ORDER — PHENYLEPHRINE HCL 10 MG/ML IJ SOLN
INTRAMUSCULAR | Status: DC | PRN
  Administered 2017-02-19 (×2): 80 ug via INTRAVENOUS
  Administered 2017-02-19: 40 ug via INTRAVENOUS
  Administered 2017-02-19 (×2): 80 ug via INTRAVENOUS

## 2017-02-19 MED ORDER — METOCLOPRAMIDE HCL 5 MG/ML IJ SOLN: 5.0000 mg | Freq: Three times a day (TID) | INTRAMUSCULAR | Status: DC | PRN

## 2017-02-19 MED ORDER — PANTOPRAZOLE SODIUM 40 MG PO TBEC
40.0000 mg | DELAYED_RELEASE_TABLET | Freq: Every day | ORAL | Status: DC
  Administered 2017-02-20 – 2017-02-21 (×2): 40 mg via ORAL
  Filled 2017-02-19 (×2): qty 1

## 2017-02-19 MED ORDER — MAGNESIUM CITRATE PO SOLN: 1.0000 | Freq: Once | ORAL | Status: DC | PRN

## 2017-02-19 MED ORDER — PROPOFOL 500 MG/50ML IV EMUL
INTRAVENOUS | Status: DC | PRN
  Administered 2017-02-19: 80 ug/kg/min via INTRAVENOUS

## 2017-02-19 MED ORDER — OXYCODONE HCL 5 MG PO TABS
5.0000 mg | ORAL_TABLET | ORAL | Status: DC | PRN
  Administered 2017-02-19 – 2017-02-21 (×9): 10 mg via ORAL
  Filled 2017-02-19 (×9): qty 2

## 2017-02-19 MED ORDER — HYDROMORPHONE HCL 1 MG/ML IJ SOLN: 0.2500 mg | INTRAMUSCULAR | Status: DC | PRN

## 2017-02-19 MED ORDER — METOCLOPRAMIDE HCL 5 MG PO TABS
5.0000 mg | ORAL_TABLET | Freq: Three times a day (TID) | ORAL | Status: DC | PRN
Start: 2017-02-19 — End: 2017-02-21
  Administered 2017-02-19: 10 mg via ORAL
  Filled 2017-02-19: qty 2

## 2017-02-19 MED ORDER — LIDOCAINE HCL (CARDIAC) 20 MG/ML IV SOLN
INTRAVENOUS | Status: DC | PRN
  Administered 2017-02-19: 60 mg via INTRAVENOUS

## 2017-02-19 SURGICAL SUPPLY — 52 items
BLADE SAGITTAL 25.0X1.19X90 (BLADE) ×2 IMPLANT
BLADE SAW SGTL 13X75X1.27 (BLADE) ×2 IMPLANT
BLADE SURG 21 STRL SS (BLADE) ×4 IMPLANT
BNDG COHESIVE 6X5 TAN STRL LF (GAUZE/BANDAGES/DRESSINGS) ×3 IMPLANT
BNDG GAUZE ELAST 4 BULKY (GAUZE/BANDAGES/DRESSINGS) ×2 IMPLANT
BONE CEMENT PALACOSE (Cement) ×4 IMPLANT
BOWL SMART MIX CTS (DISPOSABLE) ×2 IMPLANT
CAP KNEE TOTAL 3 SIGMA ×1 IMPLANT
CEMENT BONE PALACOSE (Cement) ×2 IMPLANT
COVER SURGICAL LIGHT HANDLE (MISCELLANEOUS) ×4 IMPLANT
CUFF TOURNIQUET SINGLE 34IN LL (TOURNIQUET CUFF) ×2 IMPLANT
CUFF TOURNIQUET SINGLE 44IN (TOURNIQUET CUFF) IMPLANT
DRAPE HALF SHEET 40X57 (DRAPES) ×4 IMPLANT
DRAPE U-SHAPE 47X51 STRL (DRAPES) ×2 IMPLANT
DRSG ADAPTIC 3X8 NADH LF (GAUZE/BANDAGES/DRESSINGS) ×2 IMPLANT
DRSG PAD ABDOMINAL 8X10 ST (GAUZE/BANDAGES/DRESSINGS) ×2 IMPLANT
DURAPREP 26ML APPLICATOR (WOUND CARE) ×2 IMPLANT
ELECT REM PT RETURN 9FT ADLT (ELECTROSURGICAL) ×2
ELECTRODE REM PT RTRN 9FT ADLT (ELECTROSURGICAL) ×1 IMPLANT
FACESHIELD WRAPAROUND (MASK) ×2 IMPLANT
FACESHIELD WRAPAROUND OR TEAM (MASK) ×1 IMPLANT
GAUZE SPONGE 4X4 12PLY STRL (GAUZE/BANDAGES/DRESSINGS) ×2 IMPLANT
GAUZE SPONGE 4X4 16PLY XRAY LF (GAUZE/BANDAGES/DRESSINGS) ×1 IMPLANT
GLOVE BIOGEL PI IND STRL 9 (GLOVE) ×1 IMPLANT
GLOVE BIOGEL PI INDICATOR 9 (GLOVE) ×1
GLOVE SURG ORTHO 9.0 STRL STRW (GLOVE) ×2 IMPLANT
GOWN STRL REUS W/ TWL XL LVL3 (GOWN DISPOSABLE) ×2 IMPLANT
GOWN STRL REUS W/TWL XL LVL3 (GOWN DISPOSABLE) ×4
HANDPIECE INTERPULSE COAX TIP (DISPOSABLE) ×2
KIT BASIN OR (CUSTOM PROCEDURE TRAY) ×2 IMPLANT
KIT ROOM TURNOVER OR (KITS) ×2 IMPLANT
MANIFOLD NEPTUNE II (INSTRUMENTS) ×2 IMPLANT
NDL SPNL 18GX3.5 QUINCKE PK (NEEDLE) ×1 IMPLANT
NEEDLE SPNL 18GX3.5 QUINCKE PK (NEEDLE) ×2 IMPLANT
NS IRRIG 1000ML POUR BTL (IV SOLUTION) ×2 IMPLANT
PACK TOTAL JOINT (CUSTOM PROCEDURE TRAY) ×2 IMPLANT
PACK UNIVERSAL I (CUSTOM PROCEDURE TRAY) ×2 IMPLANT
PAD ARMBOARD 7.5X6 YLW CONV (MISCELLANEOUS) ×3 IMPLANT
SET HNDPC FAN SPRY TIP SCT (DISPOSABLE) ×1 IMPLANT
STAPLER VISISTAT 35W (STAPLE) ×3 IMPLANT
SUCTION FRAZIER HANDLE 10FR (MISCELLANEOUS)
SUCTION TUBE FRAZIER 10FR DISP (MISCELLANEOUS) IMPLANT
SUT VIC AB 0 CT1 27 (SUTURE) ×2
SUT VIC AB 0 CT1 27XBRD ANBCTR (SUTURE) ×1 IMPLANT
SUT VIC AB 1 CTX 36 (SUTURE)
SUT VIC AB 1 CTX36XBRD ANBCTR (SUTURE) IMPLANT
SYR 50ML LL SCALE MARK (SYRINGE) ×2 IMPLANT
TOWEL OR 17X24 6PK STRL BLUE (TOWEL DISPOSABLE) ×2 IMPLANT
TOWEL OR 17X26 10 PK STRL BLUE (TOWEL DISPOSABLE) ×2 IMPLANT
TRAY CATH 16FR W/PLASTIC CATH (SET/KITS/TRAYS/PACK) ×1 IMPLANT
TRAY FOLEY W/METER SILVER 16FR (SET/KITS/TRAYS/PACK) IMPLANT
WRAP KNEE MAXI GEL POST OP (GAUZE/BANDAGES/DRESSINGS) ×2 IMPLANT

## 2017-02-19 NOTE — Anesthesia Procedure Notes (Signed)
Anesthesia Regional Block: Adductor canal block   Pre-Anesthetic Checklist: ,, timeout performed, Correct Patient, Correct Site, Correct Laterality, Correct Procedure, Correct Position, site marked, Risks and benefits discussed,  Surgical consent,  Pre-op evaluation,  At surgeon's request and post-op pain management  Laterality: Right  Prep: chloraprep       Needles:  Injection technique: Single-shot  Needle Type: Echogenic Needle     Needle Length: 9cm  Needle Gauge: 21     Additional Needles:   Procedures: ultrasound guided,,,,,,,,  Narrative:  Start time: 02/19/2017 8:00 AM End time: 02/19/2017 8:05 AM Injection made incrementally with aspirations every 5 mL.  Performed by: Personally  Anesthesiologist: Suella Broad D  Additional Notes: Tolerated well.

## 2017-02-19 NOTE — Anesthesia Procedure Notes (Signed)
Procedure Name: MAC Date/Time: 02/19/2017 8:37 AM Performed by: Salli Quarry Kashae Carstens Pre-anesthesia Checklist: Patient identified, Emergency Drugs available, Suction available and Patient being monitored Patient Re-evaluated:Patient Re-evaluated prior to inductionOxygen Delivery Method: Simple face mask

## 2017-02-19 NOTE — Discharge Instructions (Signed)

## 2017-02-19 NOTE — Anesthesia Postprocedure Evaluation (Addendum)
Anesthesia Post Note  Patient: [name]  Procedure(s) Performed: Procedure(s) (LRB): RIGHT TOTAL KNEE ARTHROPLASTY (Right)  Patient location during evaluation: PACU Anesthesia Type: Spinal Level of consciousness: oriented and awake and alert Pain management: pain level controlled Vital Signs Assessment: post-procedure vital signs reviewed and stable Respiratory status: spontaneous breathing, respiratory function stable and patient connected to nasal cannula oxygen Cardiovascular status: blood pressure returned to baseline and stable Postop Assessment: no headache, no backache and spinal receding Anesthetic complications: no Comments: No pain       Last Vitals:  Vitals:   02/19/17 1215 02/19/17 1247  BP: 123/73 127/61  Pulse: 78 79  Resp: 15 16  Temp: 36.8 C 37.1 C    Last Pain:  Vitals:   02/19/17 1247  TempSrc: Oral                 Leslie Bryant

## 2017-02-19 NOTE — Op Note (Signed)
DATE OF SURGERY:  02/19/2017  TIME: 9:55 AM  PATIENT NAME:  Leslie Bryant    AGE: 76 y.o.    PRE-OPERATIVE DIAGNOSIS:  OSTEOARTHRITIS RIGHT KNEE  POST-OPERATIVE DIAGNOSIS:  OSTEOARTHRITIS RIGHT KNEE  PROCEDURE:  Procedure(s): RIGHT TOTAL KNEE ARTHROPLASTY  SURGEON: Meridee Score  ASSISTANT: April Green  OPERATIVE IMPLANTS: Depuy , Posterior Stabilized.  Femur size 4, Tibia size 4, Patella size 29 mm 3-peg oval button, with a 8 mm polyethylene insert.  1 g TXA IV preoperatively and 2 g TXA topically   PREOPERATIVE INDICATIONS:   Leslie Bryant is a 76 y.o. year old female with end stage degenerative arthritis of the knee who failed conservative treatment and elected for Total Knee Arthroplasty.   The risks, benefits, and alternatives were discussed at length including but not limited to the risks of infection, bleeding, nerve injury, stiffness, blood clots, the need for revision surgery, cardiopulmonary complications, among others, and they were willing to proceed.  OPERATIVE DESCRIPTION:  The patient was brought to the operative room and placed in a supine position.  General anesthesia was administered.  IV antibiotics were given.  The lower extremity was prepped and draped in the usual sterile fashion.  Leslie Bryant was used to cover all exposed skin. Time out was performed.    Anterior quadriceps tendon splitting approach was performed.  The patella was everted and osteophytes were removed.  The anterior horn of the medial and lateral meniscus was removed.   The distal femur was opened with the drill and the intramedullary distal femoral cutting jig was utilized, set at 5 degrees valgus resecting 9 mm off the distal femur.  Care was taken to protect the collateral ligaments.  Then the extramedullary tibial cutting jig was utilized making the appropriate cut using the anterior tibial crest as a reference building in appropriate posterior slope.  Care was taken during the cut to protect  the medial and collateral ligaments.  The proximal tibia was removed along with the posterior horns of the menisci.  The PCL was sacrificed.    The extensor gap was measured and was approximately 72mm.    The distal femoral sizing jig was applied, taking care to avoid notching.  Then the 4-in-1 cutting jig was applied and the anterior and posterior femur was cut, along with the chamfer cuts.  All posterior osteophytes were removed.  The flexion gap was then measured and was symmetric with the extension gap.  The distal femoral preparation using the appropriate jig to prepare the box.  The patella was then measured, and cut with the saw.    The proximal tibia sized and prepared accordingly with the reamer and the punch, and then all components were trialed with the 52mm poly insert.  The knee was found to have stable balance and full motion.  The knee was irrigated with normal saline and the knee was soaked with TXA.  The above named components were then cemented into place and all excess cement was removed.  The final polyethylene component was in place during cementation.  The knee was  taken through a range of motion and the patella tracked well and the knee irrigated copiously and the parapatellar and subcutaneous tissue closed with vicryl, and skin closed with staples..  A sterile dressing was applied and patient  was taken to the PACU in stable  condition.  There were no complications.  Total tourniquet time was 0 minutes.

## 2017-02-19 NOTE — Evaluation (Signed)
Physical Therapy Evaluation Patient Details Name: Leslie Bryant MRN: [id number] DOB: Dec 08, 1940 Today's Date: 02/19/2017   History of Present Illness  Pt is a 76 y/o female s/p elective R TKA secondary to R knee OA. PMH includes HTN, DM, renal disease, hiatal hernia, bilat otitis media, breast cancer, osteopenia, and L TKA.   Clinical Impression  Pt is s/p elective surgery above with deficits below. PTA, pt was independent with functional mobility tasks. Upon evaluation, pt limited by post op pain and weakness and required min guard to min A for all mobility tasks. Recommending HHPT at d/c to increase functional independence and safety at home. Will continue to follow and progress mobility.     Follow Up Recommendations Home health PT;Supervision/Assistance - 24 hour    Equipment Recommendations  3in1 (PT)    Recommendations for Other Services       Precautions / Restrictions Precautions Precautions: Knee Precaution Booklet Issued: Yes (comment) Precaution Comments: Reviewed supine ther ex with pt.  Restrictions Weight Bearing Restrictions: Yes RLE Weight Bearing: Weight bearing as tolerated      Mobility  Bed Mobility Overal bed mobility: Needs Assistance Bed Mobility: Supine to Sit     Supine to sit: Supervision;HOB elevated     General bed mobility comments: Elevated HOB required for trunk elevation. Verbal cues for sequencing.   Transfers Overall transfer level: Needs assistance Equipment used: Rolling walker (2 wheeled) Transfers: Sit to/from Stand Sit to Stand: Min guard         General transfer comment: Min guard for steadying. Verbal cues for UE placement and LE placement prior to transfer.   Ambulation/Gait Ambulation/Gait assistance: Min guard Ambulation Distance (Feet): 25 Feet Assistive device: Rolling walker (2 wheeled) Gait Pattern/deviations: Step-to pattern;Decreased step length - right;Decreased weight shift to right;Antalgic Gait velocity:  Decreased Gait velocity interpretation: Below normal speed for age/gender General Gait Details: Slow, antalgic gait secondary to post op pain and weakness. Min guard for steadying. Verbal cues for upright posture. No knee buckling noted.   Stairs            Wheelchair Mobility    Modified Rankin (Stroke Patients Only)       Balance Overall balance assessment: Needs assistance Sitting-balance support: No upper extremity supported;Feet supported Sitting balance-Leahy Scale: Good     Standing balance support: Bilateral upper extremity supported;During functional activity Standing balance-Leahy Scale: Poor Standing balance comment: Reliant on RW for support                              Pertinent Vitals/Pain Pain Assessment: 0-10 Pain Score: 6  Pain Location: R knee Pain Descriptors / Indicators: Operative site guarding;Aching Pain Intervention(s): Limited activity within patient's tolerance;Monitored during session;Repositioned    Home Living Family/patient expects to be discharged to:: Private residence Living Arrangements: Spouse/significant other Available Help at Discharge: Family;Available PRN/intermittently (Husband works but almost available for 24/7 ) Type of Home: House Home Access: Stairs to enter Entrance Stairs-Rails: Right;Left;Can reach both Technical brewer of Steps: 6 Home Layout: One level Home Equipment: Environmental consultant - 2 wheels      Prior Function Level of Independence: Independent               Hand Dominance   Dominant Hand: Right    Extremity/Trunk Assessment   Upper Extremity Assessment Upper Extremity Assessment: Defer to OT evaluation    Lower Extremity Assessment Lower Extremity Assessment: RLE deficits/detail RLE Deficits / Details:  Sensory in tact. Deficits consistent with post op pain and weakness. Pt able to perform ankle pumps, quad sets, and hip abd/adduction.     Cervical / Trunk Assessment Cervical /  Trunk Assessment: Kyphotic  Communication   Communication: No difficulties  Cognition Arousal/Alertness: Awake/alert Behavior During Therapy: WFL for tasks assessed/performed Overall Cognitive Status: Within Functional Limits for tasks assessed                                        General Comments General comments (skin integrity, edema, etc.): Pt's husband present throughout.     Exercises Total Joint Exercises Ankle Circles/Pumps: AROM;Both;10 reps;Supine Quad Sets: AROM;Right;10 reps;Supine Towel Squeeze: AROM;Both;10 reps;Supine Hip ABduction/ADduction: AROM;Right;10 reps;Supine   Assessment/Plan    PT Assessment Patient needs continued PT services  PT Problem List Decreased strength;Decreased range of motion;Decreased balance;Decreased mobility;Decreased knowledge of use of DME;Decreased knowledge of precautions;Pain       PT Treatment Interventions      PT Goals (Current goals can be found in the Care Plan section)  Acute Rehab PT Goals Patient Stated Goal: to return home  PT Goal Formulation: With patient Time For Goal Achievement: 02/26/17 Potential to Achieve Goals: Good    Frequency 7X/week   Barriers to discharge        Co-evaluation               End of Session Equipment Utilized During Treatment: Gait belt Activity Tolerance: Patient tolerated treatment well Patient left: in chair;with call bell/phone within reach;with family/visitor present;with nursing/sitter in room Nurse Communication: Mobility status PT Visit Diagnosis: Other abnormalities of gait and mobility (R26.89);Pain Pain - Right/Left: Right Pain - part of body: Knee    Time: 3383-2919 PT Time Calculation (min) (ACUTE ONLY): 30 min   Charges:   PT Evaluation $PT Eval Low Complexity: 1 Procedure PT Treatments $Gait Training: 8-22 mins   PT G Codes:        Nicky Pugh, PT, DPT  Acute Rehabilitation Services  Pager: [phone]   Army Melia 02/19/2017, 4:05 PM

## 2017-02-19 NOTE — Anesthesia Procedure Notes (Addendum)
Spinal  Patient location during procedure: OR Start time: 02/19/2017 8:35 AM End time: 02/19/2017 8:37 AM Staffing Anesthesiologist: Suella Broad D Performed: anesthesiologist  Preanesthetic Checklist Completed: patient identified, site marked, surgical consent, pre-op evaluation, timeout performed, IV checked, risks and benefits discussed and monitors and equipment checked Spinal Block Patient position: sitting Prep: Betadine Patient monitoring: heart rate, continuous pulse ox, blood pressure and cardiac monitor Approach: midline Location: L4-5 Injection technique: single-shot Needle Needle type: Introducer and Pencan  Needle gauge: 24 G Needle length: 9 cm Additional Notes Negative paresthesia. Negative blood return. Positive free-flowing CSF. Expiration date of kit checked and confirmed. Patient tolerated procedure well, without complications.

## 2017-02-19 NOTE — Transfer of Care (Signed)
Immediate Anesthesia Transfer of Care Note  Patient: Leslie Bryant  Procedure(s) Performed: Procedure(s): RIGHT TOTAL KNEE ARTHROPLASTY (Right)  Patient Location: PACU  Anesthesia Type:MAC combined with regional for post-op pain  Level of Consciousness: awake, alert , oriented and patient cooperative  Airway & Oxygen Therapy: Patient Spontanous Breathing  Post-op Assessment: Report given to RN and Post -op Vital signs reviewed and stable  Post vital signs: Reviewed and stable  Last Vitals:  Vitals:   02/19/17 0647  BP: 139/65  Pulse: 85  Resp: (!) 22  Temp: 36.7 C    Last Pain:  Vitals:   02/19/17 0647  TempSrc: Oral         Complications: No apparent anesthesia complications

## 2017-02-19 NOTE — Progress Notes (Signed)
Notified Ortho Tech about trapeze bar.

## 2017-02-19 NOTE — H&P (Signed)
TOTAL KNEE ADMISSION H&P  Patient is being admitted for right total knee arthroplasty.  Subjective:  Chief Complaint:right knee pain.  HPI: Leslie Bryant, 76 y.o. female, has a history of pain and functional disability in the right knee due to arthritis and has failed non-surgical conservative treatments for greater than 12 weeks to includeNSAID's and/or analgesics, corticosteriod injections and activity modification.  Onset of symptoms was gradual, starting 8 years ago with gradually worsening course since that time. The patient noted no past surgery on the right knee(s).  Patient currently rates pain in the right knee(s) at 8 out of 10 with activity. Patient has night pain, worsening of pain with activity and weight bearing, pain that interferes with activities of daily living, pain with passive range of motion, crepitus and joint swelling.  Patient has evidence of subchondral cysts, subchondral sclerosis, periarticular osteophytes, joint subluxation and joint space narrowing by imaging studies. This patient has had avascular necrosis of the knee. There is no active infection.  Patient Active Problem List   Diagnosis Date Noted  . Bilateral otitis media with effusion 01/23/2017  . Preoperative cardiovascular examination 12/24/2016  . Cough 11/06/2016  . Hyponatremia 11/06/2016  . Diabetes ([place]) 11/06/2016  . Dysuria 04/30/2016  . Plantar fasciitis of right foot 12/28/2014  . Metatarsal deformity 12/28/2014  . Unspecified deficiency anemia 06/09/2013  . Anemia, unspecified 05/06/2013  . Lower back pain 02/01/2013  . Osteoarthritis of left knee 09/08/2012  . Complex tear of medial meniscus of left knee as current injury 09/08/2012  . Lower abdominal pain 07/23/2012  . Encounter for well adult exam with abnormal findings 05/05/2012  . Family hx of colon cancer 05/05/2012  . History of breast cancer 02/06/2011  . Palpitations   . Supraventricular tachycardia ([place])   . EUSTACHIAN TUBE  DYSFUNCTION, LEFT 04/11/2010  . Anxiety state 05/05/2008  . Essential hypertension 05/05/2008  . Allergic rhinitis 05/05/2008  . IBS 05/05/2008  . OSTEOPENIA 05/05/2008  . COLONIC POLYPS, HX OF 05/05/2008  . Hyperlipidemia 05/29/2007  . GERD 05/29/2007   Past Medical History:  Diagnosis Date  . Acute meniscal tear of knee LEFT KNEE  . Arthritis KNEES  . 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 Cancer [facility])    breast cancer right  . Diverticulitis of large intestine with perforation 11/02/2011   Microperforation probably related to nut and popcorn consumption.  Resolved by CT scan Recurrent episode clinical dx 07/2012   . Diverticulosis of colon    sigmoid  . GERD (gastroesophageal reflux disease)   . H/O hiatal hernia   . Hypercholesterolemia    history  . Left knee pain    occasionally  . Palpitations IRREGULAR HEARTBEAT--  CONTROLLED W/  BETA BLOCKER  . Pneumonia    hisotry  . Pre-diabetes    pcp is monitoring a1c currently 02/10/17  . Swelling of left knee joint     Past Surgical History:  Procedure Laterality Date  . BREAST RECONTRUCTION W/ IMPLANTS  1987  . CARDIAC CATHETERIZATION  12-22-2000  DR BRACKBILL   NORMAL LVF/ NORMAL CORONARY ARTERIES  . CHOLECYSTECTOMY  1988  . CHONDROPLASTY  09/08/2012   Procedure: CHONDROPLASTY;  Surgeon: Tobi Bastos, MD;  Location: [facility];  Service: Orthopedics;  Laterality: Left;  . COLONOSCOPY     last colon 05-03-2009  . KNEE ARTHROSCOPY  2011   RIGHT KNEE  . KNEE ARTHROSCOPY WITH MEDIAL MENISECTOMY  09/08/2012   Procedure: KNEE ARTHROSCOPY WITH  MEDIAL MENISECTOMY;  Surgeon: Tobi Bastos, MD;  Location: [facility];  Service: Orthopedics;  Laterality: Left;  lateral tibial plateau,   . MASTECTOMY  1986   RIGHT BREAST W/ MULTIPLE NODE DISSECTION  . POLYPECTOMY    . TOTAL KNEE ARTHROPLASTY Left 07/21/2013   Procedure: TOTAL KNEE ARTHROPLASTY;  Surgeon: Newt Minion, MD;  Location: [facility];   Service: Orthopedics;  Laterality: Left;  Left Total Knee Arthroplasty  . VAGINAL HYSTERECTOMY  1966    Prescriptions Prior to Admission  Medication Sig Dispense Refill Last Dose  . aspirin 81 MG tablet Take 81 mg by mouth 2 (two) times daily.   Past Week at Unknown time  . BIOTIN 5000 PO Take 5,000 mcg by mouth daily.    Past Week at Unknown time  . co-enzyme Q-10 30 MG capsule Take 30 mg by mouth 2 (two) times daily.    Past Week at Unknown time  . ezetimibe (ZETIA) 10 MG tablet Take 1 tablet (10 mg total) by mouth daily. 30 tablet 8 02/18/2017 at Unknown time  . fish oil-omega-3 fatty acids 1000 MG capsule Take 1 g by mouth daily.    Past Week at Unknown time  . ibuprofen (ADVIL,MOTRIN) 200 MG tablet Take 400 mg by mouth 2 (two) times daily as needed for moderate pain.   Past Week at Unknown time  . metFORMIN (GLUCOPHAGE) 500 MG tablet Take 1 tablet (500 mg total) by mouth daily with breakfast. 90 tablet 3 02/18/2017 at Unknown time  . metoprolol (LOPRESSOR) 50 MG tablet TAKE 1 TABLET BY MOUTH TWICE DAILY 180 tablet 3 02/19/2017 at Unknown time  . Multiple Vitamins-Minerals (CENTRUM SILVER PO) Take 1 tablet by mouth every morning.    Past Week at Unknown time  . omeprazole (PRILOSEC) 40 MG capsule Take 1 capsule (40 mg total) by mouth daily. 90 capsule 0 02/19/2017 at Unknown time  . Probiotic Product (ALIGN) 4 MG CAPS Take 4 mg by mouth daily.   Past Week at Unknown time  . metroNIDAZOLE (FLAGYL) 500 MG tablet Take 1 tablet (500 mg total) by mouth 3 (three) times daily. (Patient not taking: Reported on 02/05/2017) 21 tablet 0 Completed Course at Unknown time   Allergies  Allergen Reactions  . Crestor [Rosuvastatin Calcium] Other (See Comments)    Shoulder pain  . Lipitor [Atorvastatin Calcium] Other (See Comments)    Myalgias   . Pravastatin Other (See Comments)    myalgias  . Penicillins Itching    Has patient had a PCN reaction causing immediate rash, facial/tongue/throat swelling, SOB or  lightheadedness with hypotension: No Has patient had a PCN reaction causing severe rash involving mucus membranes or skin necrosis: No Has patient had a PCN reaction that required hospitalization No Has patient had a PCN reaction occurring within the last 10 years: No If all of the above answers are "NO", then may proceed with Cephalosporin use.     Social History  Substance Use Topics  . Smoking status: Never Smoker  . Smokeless tobacco: Never Used  . Alcohol use 0.6 oz/week    1 Glasses of wine per week     Comment: occ    Family History  Problem Relation Age of Onset  . Heart attack Father   . Heart failure Sister   . Colon cancer Brother     died/age 64  . Lung cancer Sister   . Brain cancer Sister   . Cancer Brother     mouth  .  Leukemia Brother      ROS  Objective:  Physical Exam  Vital signs in last 24 hours: Temp:  [98 F (36.7 C)] 98 F (36.7 C) (04/18 0647) Pulse Rate:  [85] 85 (04/18 0647) Resp:  [22] 22 (04/18 0647) BP: (139)/(65) 139/65 (04/18 0647) SpO2:  [99 %] 99 % (04/18 0647)  Labs:   Estimated body mass index is 29.67 kg/m as calculated from the following:   Height as of 02/10/17: 5' 1.5" (1.562 m).   Weight as of 02/10/17: 159 lb 9.6 oz (72.4 kg).   Imaging Review Plain radiographs demonstrate moderate degenerative joint disease of the right knee(s). The overall alignment ismild valgus. The bone quality appears to be adequate for age and reported activity level.  Assessment/Plan:  End stage arthritis, right knee   The patient history, physical examination, clinical judgment of the provider and imaging studies are consistent with end stage degenerative joint disease of the right knee(s) and total knee arthroplasty is deemed medically necessary. The treatment options including medical management, injection therapy arthroscopy and arthroplasty were discussed at length. The risks and benefits of total knee arthroplasty were presented and  reviewed. The risks due to aseptic loosening, infection, stiffness, patella tracking problems, thromboembolic complications and other imponderables were discussed. The patient acknowledged the explanation, agreed to proceed with the plan and consent was signed. Patient is being admitted for inpatient treatment for surgery, pain control, PT, OT, prophylactic antibiotics, VTE prophylaxis, progressive ambulation and ADL's and discharge planning. The patient is planning to be discharged home with home health services

## 2017-02-20 ENCOUNTER — Encounter (HOSPITAL_COMMUNITY): Payer: Self-pay | Admitting: Orthopedic Surgery

## 2017-02-20 LAB — GLUCOSE, CAPILLARY
GLUCOSE-CAPILLARY: 128 mg/dL — AB (ref 65–99)
Glucose-Capillary: 114 mg/dL — ABNORMAL HIGH (ref 65–99)
Glucose-Capillary: 142 mg/dL — ABNORMAL HIGH (ref 65–99)

## 2017-02-20 NOTE — Progress Notes (Signed)
Physical Therapy Treatment Patient Details Name: Leslie Bryant MRN: [id number] DOB: 05/04/1941 Today's Date: 02/20/2017    History of Present Illness Pt is a 76 y/o female s/p elective R TKA secondary to R knee OA. PMH includes HTN, DM, renal disease, hiatal hernia, bilat otitis media, breast cancer, osteopenia, and L TKA.     PT Comments    Patient is progressing toward mobility goals. Patient needs to practice stairs next session.      Follow Up Recommendations  Home health PT;Supervision/Assistance - 24 hour     Equipment Recommendations  3in1 (PT)    Recommendations for Other Services       Precautions / Restrictions Precautions Precautions: Knee Precaution Booklet Issued: Yes (comment) Precaution Comments: reviewed precautions/positioning with pt Restrictions Weight Bearing Restrictions: Yes RLE Weight Bearing: Weight bearing as tolerated    Mobility  Bed Mobility Overal bed mobility: Modified Independent Bed Mobility: Supine to Sit           General bed mobility comments: increased time/effort  Transfers Overall transfer level: Needs assistance Equipment used: Rolling walker (2 wheeled) Transfers: Sit to/from Stand Sit to Stand: Min guard         General transfer comment: min guard for safety; cues for hand placement  Ambulation/Gait Ambulation/Gait assistance: Supervision Ambulation Distance (Feet): 120 Feet Assistive device: Rolling walker (2 wheeled) Gait Pattern/deviations: Step-to pattern;Decreased step length - right;Decreased weight shift to right;Antalgic;Step-through pattern;Decreased stance time - left Gait velocity: Decreased   General Gait Details: mildly antalgic but steady gait; cues for posture, R knee flexion during swing phase, and R heel strike   Stairs            Wheelchair Mobility    Modified Rankin (Stroke Patients Only)       Balance Overall balance assessment: Needs assistance Sitting-balance support: No upper  extremity supported;Feet supported Sitting balance-Leahy Scale: Good     Standing balance support: Bilateral upper extremity supported;During functional activity Standing balance-Leahy Scale: Poor Standing balance comment: Reliant on RW for support                             Cognition Arousal/Alertness: Awake/alert Behavior During Therapy: WFL for tasks assessed/performed Overall Cognitive Status: Within Functional Limits for tasks assessed                                        Exercises Total Joint Exercises Quad Sets: AROM;Right;10 reps Short Arc Quad: AROM;Right;10 reps Heel Slides: AROM;Right;10 reps Straight Leg Raises: AROM;Right;10 reps Goniometric ROM: 5-80    General Comments        Pertinent Vitals/Pain Pain Assessment: Faces Faces Pain Scale: Hurts little more Pain Location: R knee Pain Descriptors / Indicators: Aching;Guarding;Tightness Pain Intervention(s): Limited activity within patient's tolerance;Monitored during session;Premedicated before session;Repositioned    Home Living                      Prior Function            PT Goals (current goals can now be found in the care plan section) Acute Rehab PT Goals Patient Stated Goal: to return home  PT Goal Formulation: With patient Time For Goal Achievement: 02/26/17 Potential to Achieve Goals: Good Progress towards PT goals: Progressing toward goals    Frequency    7X/week  PT Plan Current plan remains appropriate    Co-evaluation             End of Session Equipment Utilized During Treatment: Gait belt Activity Tolerance: Patient tolerated treatment well Patient left: in chair;with call bell/phone within reach;with family/visitor present Nurse Communication: Mobility status PT Visit Diagnosis: Other abnormalities of gait and mobility (R26.89);Pain Pain - Right/Left: Right Pain - part of body: Knee     Time: 1111-1140 PT Time  Calculation (min) (ACUTE ONLY): 29 min  Charges:  $Gait Training: 8-22 mins $Therapeutic Exercise: 8-22 mins                    G Codes:       Earney Navy, PTA Pager: [phone]     Darliss Cheney 02/20/2017, 1:57 PM

## 2017-02-20 NOTE — Progress Notes (Addendum)
Physical Therapy Treatment Patient Details Name: Leslie Bryant MRN: [id number] DOB: 1941/08/12 Today's Date: 02/20/2017    History of Present Illness Pt is a 76 y/o female s/p elective R TKA secondary to R knee OA. PMH includes HTN, DM, renal disease, hiatal hernia, bilat otitis media, breast cancer, osteopenia, and L TKA.     PT Comments    Patient able to increase gait distance and safely negotiate 10 steps this session. Overall pt requires supervision/min guard for mobility. Continue to progress as tolerated with anticipated d/c home with HHPT.    Follow Up Recommendations  Home health PT;Supervision/Assistance - 24 hour     Equipment Recommendations  3in1 (PT)    Recommendations for Other Services       Precautions / Restrictions Precautions Precautions: Knee Precaution Booklet Issued: Yes (comment) Precaution Comments: reviewed precautions/positioning with pt, husband, and son Restrictions Weight Bearing Restrictions: Yes RLE Weight Bearing: Weight bearing as tolerated    Mobility  Bed Mobility Overal bed mobility: Modified Independent Bed Mobility: Sit to Supine           General bed mobility comments: increased time/effort  Transfers Overall transfer level: Needs assistance Equipment used: Rolling walker (2 wheeled) Transfers: Sit to/from Stand Sit to Stand: Supervision         General transfer comment: pt with safe hand placement and technique  Ambulation/Gait Ambulation/Gait assistance: Supervision Ambulation Distance (Feet): 175 Feet Assistive device: Rolling walker (2 wheeled) Gait Pattern/deviations: Decreased weight shift to right;Step-through pattern;Decreased stance time - right;Decreased step length - left;Antalgic Gait velocity: Decreased   General Gait Details: cues for posture and R knee flexion durins swing phase; pt is making improvement with step through pattern and R knee flexion/heel strike   Stairs Stairs: Yes   Stair  Management: One rail Right;Step to pattern;Sideways Number of Stairs: 10 General stair comments: cues for sequencing and technique; min guard for safety; no knee buckling  Wheelchair Mobility    Modified Rankin (Stroke Patients Only)       Balance Overall balance assessment: Needs assistance Sitting-balance support: No upper extremity supported;Feet supported Sitting balance-Leahy Scale: Good     Standing balance support: Bilateral upper extremity supported;During functional activity Standing balance-Leahy Scale: Fair Standing balance comment: able to static stand without UE support                            Cognition Arousal/Alertness: Awake/alert Behavior During Therapy: WFL for tasks assessed/performed Overall Cognitive Status: Within Functional Limits for tasks assessed                                        Exercises Total Joint Exercises Quad Sets: AROM;Right;10 reps Short Arc Quad: AROM;Right;10 reps Heel Slides: AROM;Right;10 reps Straight Leg Raises: AROM;Right;10 reps Goniometric ROM: 5-80    General Comments General comments (skin integrity, edema, etc.): husband and son present      Pertinent Vitals/Pain Pain Assessment: Faces Faces Pain Scale: Hurts little more Pain Location: R knee Pain Descriptors / Indicators: Sore Pain Intervention(s): Monitored during session;Premedicated before session;Repositioned;Ice applied    Home Living                      Prior Function            PT Goals (current goals can now be found in the care  plan section) Acute Rehab PT Goals Patient Stated Goal: to return home  PT Goal Formulation: With patient Time For Goal Achievement: 02/26/17 Potential to Achieve Goals: Good Progress towards PT goals: Progressing toward goals    Frequency    7X/week      PT Plan Current plan remains appropriate    Co-evaluation             End of Session Equipment Utilized During  Treatment: Gait belt Activity Tolerance: Patient tolerated treatment well Patient left: with call bell/phone within reach;with family/visitor present;in bed Nurse Communication: Mobility status PT Visit Diagnosis: Other abnormalities of gait and mobility (R26.89);Pain Pain - Right/Left: Right Pain - part of body: Knee     Time: 2952-8413 PT Time Calculation (min) (ACUTE ONLY): 20 min  Charges:  $Gait Training: 8-22 mins                     G Codes:       Earney Navy, PTA Pager: [phone]     Darliss Cheney 02/20/2017, 4:26 PM

## 2017-02-20 NOTE — Progress Notes (Signed)
OT Cancellation Note  Patient Details Name: Leslie Bryant MRN: [id number] DOB: June 14, 1941   Cancelled Treatment:    Reason Eval/Treat Not Completed: Pain limiting ability to participate. Pt reporting 8/10 pain at rest and requesting OT to reattempt eval once pain decreases. "I did a lot this morning, therapy, washed up. I might have overdone it" Will reattempt as able.   Clover Mealy OTR/L Pager: [phone]  02/20/2017, 12:17 PM

## 2017-02-20 NOTE — Progress Notes (Signed)
Patient ID: Leslie Bryant, female   DOB: May 12, 1941, 76 y.o.   MRN: [id number] Postoperative day 1 right total knee arthroplasty. Patient is alert and comfortable this morning. Plan for physical therapy weightbearing as tolerated. Anticipate discharge to home possibly tomorrow.

## 2017-02-21 MED ORDER — METHOCARBAMOL 500 MG PO TABS: 500.0000 mg | ORAL_TABLET | Freq: Three times a day (TID) | ORAL | 0 refills | Status: DC

## 2017-02-21 MED ORDER — OXYCODONE-ACETAMINOPHEN 5-325 MG PO TABS: 1.0000 | ORAL_TABLET | ORAL | 0 refills | Status: DC | PRN

## 2017-02-21 NOTE — Discharge Summary (Signed)
Discharge Diagnoses:  Active Problems:   Total knee replacement status, right   Surgeries: Procedure(s): RIGHT TOTAL KNEE ARTHROPLASTY on 02/19/2017    Consultants:   Discharged Condition: Improved  Hospital Course: Leslie Bryant is an 76 y.o. female who was admitted 02/19/2017 with a chief complaint of osteoarthritis right knee, with a final diagnosis of OSTEOARTHRITIS RIGHT KNEE.  Patient was brought to the operating room on 02/19/2017 and underwent Procedure(s): RIGHT TOTAL KNEE ARTHROPLASTY.    Patient was given perioperative antibiotics: Anti-infectives    Start     Dose/Rate Route Frequency Ordered Stop   02/19/17 1430  clindamycin (CLEOCIN) IVPB 600 mg     600 mg 100 mL/hr over 30 Minutes Intravenous Every 6 hours 02/19/17 1238 02/19/17 2120   02/19/17 0656  clindamycin (CLEOCIN) IVPB 900 mg     900 mg 100 mL/hr over 30 Minutes Intravenous On call to O.R. 02/19/17 1194 02/19/17 0840    .  Patient was given sequential compression devices, early ambulation, and aspirin for DVT prophylaxis.  Recent vital signs: Patient Vitals for the past 24 hrs:  BP Temp Temp src Pulse Resp SpO2 Height Weight  02/21/17 0512 (!) 132/54 99.2 F (37.3 C) Oral 98 - 93 % - -  02/20/17 2121 131/65 98.1 F (36.7 C) Oral (!) 107 - 92 % - -  02/20/17 1737 - - - - - - 5\' 2"  (1.575 m) 163 lb (73.9 kg)  02/20/17 1300 118/61 98.8 F (37.1 C) Oral 92 (!) 22 93 % - -  .  Recent laboratory studies: No results found.  Discharge Medications:   Allergies as of 02/21/2017      Reactions   Crestor [rosuvastatin Calcium] Other (See Comments)   Shoulder pain   Lipitor [atorvastatin Calcium] Other (See Comments)   Myalgias   Pravastatin Other (See Comments)   myalgias   Penicillins Itching   Has patient had a PCN reaction causing immediate rash, facial/tongue/throat swelling, SOB or lightheadedness with hypotension: No Has patient had a PCN reaction causing severe rash involving mucus membranes or skin  necrosis: No Has patient had a PCN reaction that required hospitalization No Has patient had a PCN reaction occurring within the last 10 years: No If all of the above answers are "NO", then may proceed with Cephalosporin use.      Medication List    TAKE these medications   ALIGN 4 MG Caps Take 4 mg by mouth daily.   aspirin 81 MG tablet Take 81 mg by mouth 2 (two) times daily.   BIOTIN 5000 PO Take 5,000 mcg by mouth daily.   CENTRUM SILVER PO Take 1 tablet by mouth every morning.   co-enzyme Q-10 30 MG capsule Take 30 mg by mouth 2 (two) times daily.   ezetimibe 10 MG tablet Commonly known as:  ZETIA Take 1 tablet (10 mg total) by mouth daily.   fish oil-omega-3 fatty acids 1000 MG capsule Take 1 g by mouth daily.   ibuprofen 200 MG tablet Commonly known as:  ADVIL,MOTRIN Take 400 mg by mouth 2 (two) times daily as needed for moderate pain.   metFORMIN 500 MG tablet Commonly known as:  GLUCOPHAGE Take 1 tablet (500 mg total) by mouth daily with breakfast.   methocarbamol 500 MG tablet Commonly known as:  ROBAXIN Take 1 tablet (500 mg total) by mouth 3 (three) times daily.   metoprolol 50 MG tablet Commonly known as:  LOPRESSOR TAKE 1 TABLET BY MOUTH TWICE DAILY  metroNIDAZOLE 500 MG tablet Commonly known as:  FLAGYL Take 1 tablet (500 mg total) by mouth 3 (three) times daily.   omeprazole 40 MG capsule Commonly known as:  PRILOSEC Take 1 capsule (40 mg total) by mouth daily.   oxyCODONE-acetaminophen 5-325 MG tablet Commonly known as:  ROXICET Take 1 tablet by mouth every 4 (four) hours as needed for severe pain.       Diagnostic Studies: Dg Abd 2 Views  Result Date: 01/27/2017 CLINICAL DATA:  Abdominal pain for 3 days, initial encounter EXAM: ABDOMEN - 2 VIEW COMPARISON:  None. FINDINGS: Scattered large and small bowel gas is noted. No obstructive changes are seen. No free air is noted. No abnormal mass or abnormal calcifications are noted. Mild  degenerative changes of the lumbar spine are seen. IMPRESSION: No acute abnormality noted. Electronically Signed   By: Inez Catalina M.D.   On: 01/27/2017 12:30    Patient benefited maximally from their hospital stay and there were no complications.     Disposition: 01-Home or Self Care Discharge Instructions    Call MD / Call 911    Complete by:  As directed    If you experience chest pain or shortness of breath, CALL 911 and be transported to the hospital emergency room.  If you develope a fever above 101 F, pus (white drainage) or increased drainage or redness at the wound, or calf pain, call your surgeon's office.   Constipation Prevention    Complete by:  As directed    Drink plenty of fluids.  Prune juice may be helpful.  You may use a stool softener, such as Colace (over the counter) 100 mg twice a day.  Use MiraLax (over the counter) for constipation as needed.   Diet - low sodium heart healthy    Complete by:  As directed    Increase activity slowly as tolerated    Complete by:  As directed    Weight bearing as tolerated    Complete by:  As directed    Laterality:  right   Extremity:  Lower     Follow-up Information    Newt Minion, MD Follow up in 1 week(s).   Specialty:  Orthopedic Surgery Contact information: [address] [place] [address] [phone]            Signed: Newt Minion 02/21/2017, 7:34 AM

## 2017-02-21 NOTE — Progress Notes (Signed)
Pt ready for D/C. Husband at bedside.  D/C instructions and Rx reviewed with pt. All personal belongings with pt. Pt in NAD.

## 2017-02-21 NOTE — Progress Notes (Signed)
Physical Therapy Treatment Patient Details Name: Leslie Bryant MRN: [id number] DOB: [birth year] Today's Date: 02/21/2017    History of Present Illness Pt is a 76 y/o female s/p elective R TKA secondary to R knee OA. PMH includes HTN, DM, renal disease, hiatal hernia, bilat otitis media, breast cancer, osteopenia, and L TKA.     PT Comments    Patient is making good progress with PT.  From a mobility standpoint anticipate patient will be ready for DC home when medically ready.   Follow Up Recommendations  Home health PT;Supervision/Assistance - 24 hour     Equipment Recommendations  3in1 (PT)    Recommendations for Other Services       Precautions / Restrictions Precautions Precautions: Knee Precaution Comments: reviewed precautions/positioning with pt and husband Restrictions Weight Bearing Restrictions: Yes RLE Weight Bearing: Weight bearing as tolerated    Mobility  Bed Mobility               General bed mobility comments: Pt OOB in chair upon arrival  Transfers Overall transfer level: Modified independent Equipment used: Rolling walker (2 wheeled)                Ambulation/Gait Ambulation/Gait assistance: Supervision Ambulation Distance (Feet): 150 Feet Assistive device: Rolling walker (2 wheeled) Gait Pattern/deviations: Decreased weight shift to right;Step-through pattern;Decreased stride length Gait velocity: Decreased   General Gait Details: pt with improved gait mechanics and posture; safe use of AD; steady gait   Stairs            Wheelchair Mobility    Modified Rankin (Stroke Patients Only)       Balance Overall balance assessment: Needs assistance Sitting-balance support: No upper extremity supported;Feet supported Sitting balance-Leahy Scale: Good       Standing balance-Leahy Scale: Fair Standing balance comment: able to static stand without UE support                            Cognition Arousal/Alertness:  Awake/alert Behavior During Therapy: WFL for tasks assessed/performed Overall Cognitive Status: Within Functional Limits for tasks assessed                                        Exercises Total Joint Exercises Quad Sets: AROM;Right;10 reps Short Arc Quad: AROM;Right;10 reps Heel Slides: AAROM;Right;10 reps Hip ABduction/ADduction: AROM;Right;10 reps Straight Leg Raises: AROM;Right;10 reps Long Arc Quad: AROM;Right;10 reps Knee Flexion: AROM;Right;10 reps;Seated (10 second holds) Goniometric ROM: 0-90 AROM    General Comments        Pertinent Vitals/Pain Pain Assessment: Faces Faces Pain Scale: Hurts little more Pain Location: R knee Pain Descriptors / Indicators: Sore Pain Intervention(s): Limited activity within patient's tolerance;Monitored during session;Premedicated before session;Repositioned    Home Living                      Prior Function            PT Goals (current goals can now be found in the care plan section) Acute Rehab PT Goals Patient Stated Goal: to return home  PT Goal Formulation: With patient Time For Goal Achievement: 02/26/17 Potential to Achieve Goals: Good Progress towards PT goals: Progressing toward goals    Frequency    7X/week      PT Plan Current plan remains appropriate    Co-evaluation  End of Session Equipment Utilized During Treatment: Gait belt Activity Tolerance: Patient tolerated treatment well Patient left: with call bell/phone within reach;with family/visitor present;in chair Nurse Communication: Mobility status PT Visit Diagnosis: Other abnormalities of gait and mobility (R26.89);Pain Pain - Right/Left: Right Pain - part of body: Knee     Time: 9628-3662 PT Time Calculation (min) (ACUTE ONLY): 27 min  Charges:  $Gait Training: 8-22 mins $Therapeutic Exercise: 8-22 mins                    G Codes:       Earney Navy, PTA Pager: [phone]     Darliss Cheney 02/21/2017, 9:21 AM

## 2017-02-21 NOTE — Care Management Note (Signed)
Case Management Note  Patient Details  Name: [name] MRN: [id number] Date of Birth: 11/21/1940  Subjective/Objective:  76 yr old female s/p right total knee arthroplasty.                  Action/Plan: Case manager spoke with patient and her husband concerning discharge plan and DME needs. Patient was preoperatively setup with [facility], no changes. She states she has a rolling walker and high toilet seat, does not need 3in1. Patient will have family support at discharge.   Expected Discharge Date:  02/21/17               Expected Discharge Plan:  [place]  In-House Referral:  NA  Discharge planning Services  CM Consult  Post Acute Care Choice:  Home Health Choice offered to:  Patient  DME Arranged:  N/A (has RW) DME Agency:  NA  HH Arranged:  [place] Agency:  [facility] (formerly [employer])  Status of Service:  Completed, signed off  If discussed at [employer], dates discussed:    Additional Comments:  Ninfa Meeker, RN 02/21/2017, 11:36 AM

## 2017-02-21 NOTE — Progress Notes (Signed)
Occupational Therapy Evaluation Patient Details Name: Leslie Bryant MRN: [id number] DOB: 1941/05/28 Today's Date: 02/21/2017    History of Present Illness Pt is a 76 y/o female s/p elective R TKA secondary to R knee OA. PMH includes HTN, DM, renal disease, hiatal hernia, bilat otitis media, breast cancer, osteopenia, and L TKA.    Clinical Impression   Completed all education regarding compensatory techniques for ADL, functional mobility for ADL,  home safety and reducing risk of falls at home. Pt safe to DC home when medically stable.     Follow Up Recommendations  No OT follow up;Supervision - Intermittent    Equipment Recommendations  None recommended by OT    Recommendations for Other Services       Precautions / Restrictions Precautions Precautions: Knee Precaution Comments: reviewed precautions/positioning with pt and husband Restrictions Weight Bearing Restrictions: Yes RLE Weight Bearing: Weight bearing as tolerated      Mobility Bed Mobility               General bed mobility comments: Pt OOB in chair upon arrival  Transfers Overall transfer level: Modified independent Equipment used: Rolling walker (2 wheeled)                  Balance Overall balance assessment: Needs assistance Sitting-balance support: No upper extremity supported;Feet supported Sitting balance-Leahy Scale: Good     Standing balance support: Bilateral upper extremity supported;During functional activity Standing balance-Leahy Scale: Fair                             ADL either performed or assessed with clinical judgement   ADL Overall ADL's : Needs assistance/impaired                                     Functional mobility during ADLs: Modified independent;Rolling walker General ADL Comments: completed education regarding compensatory strategies for ADL, home safety and reducing risk of falls. Educated pt/husband on safe showerstall transfer  technique. Pt/husband able to return demonstrate.      Vision         Perception     Praxis      Pertinent Vitals/Pain Pain Assessment: 0-10 Pain Score: 3  Pain Location: R knee Pain Descriptors / Indicators: Sore Pain Intervention(s): Limited activity within patient's tolerance     Hand Dominance Right   Extremity/Trunk Assessment Upper Extremity Assessment Upper Extremity Assessment: Overall WFL for tasks assessed   Lower Extremity Assessment Lower Extremity Assessment: Defer to PT evaluation RLE Deficits / Details: Sensory in tact. Deficits consistent with post op pain and weakness. Pt able to perform ankle pumps, quad sets, and hip abd/adduction.    Cervical / Trunk Assessment Cervical / Trunk Assessment: Normal   Communication Communication Communication: No difficulties   Cognition Arousal/Alertness: Awake/alert Behavior During Therapy: WFL for tasks assessed/performed Overall Cognitive Status: Within Functional Limits for tasks assessed                                     General Comments       Exercises Total Joint Exercises Knee Flexion:  (10 second holds)   Shoulder Instructions      Home Living Family/patient expects to be discharged to:: Private residence Living Arrangements: Spouse/significant other Available Help at Discharge: Family;Available  PRN/intermittently (Husband works but almost available for 24/7 ) Type of Home: House Home Access: Stairs to enter Technical brewer of Steps: 6 Entrance Stairs-Rails: Right;Left;Can reach both [place]: One level     Bathroom Shower/Tub: Teacher, early years/pre: Handicapped height Bathroom Accessibility: Yes How Accessible: Accessible via walker Home Equipment: [place] - 2 wheels;Shower seat - built in;Grab bars - tub/shower          Prior Functioning/Environment Level of Independence: Independent                 OT Problem List: Decreased  strength;Decreased range of motion;Decreased knowledge of use of DME or AE;Pain      OT Treatment/Interventions:      OT Goals(Current goals can be found in the care plan section) Acute Rehab OT Goals Patient Stated Goal: to return home  OT Goal Formulation: All assessment and education complete, DC therapy  OT Frequency:     Barriers to D/C:            Co-evaluation              End of Session Equipment Utilized During Treatment: Surveyor, mining Communication: Other (comment) (ready for DC)  Activity Tolerance: Patient tolerated treatment well Patient left: in chair;with call bell/phone within reach;with family/visitor present  OT Visit Diagnosis: Unsteadiness on feet (R26.81);Pain Pain - Right/Left: Right Pain - part of body: Knee                Time: 0920-0933 OT Time Calculation (min): 13 min Charges:  OT General Charges $OT Visit: 1 Procedure OT Evaluation $OT Eval Low Complexity: 1 Procedure G-Codes:     Wynton Hufstetler, OT/L  [phone] 02/21/2017  Roselin Wiemann,HILLARY 02/21/2017, 1:37 PM

## 2017-02-22 DIAGNOSIS — I10 Essential (primary) hypertension: Secondary | ICD-10-CM | POA: Diagnosis not present

## 2017-02-22 DIAGNOSIS — M8580 Other specified disorders of bone density and structure, unspecified site: Secondary | ICD-10-CM | POA: Diagnosis not present

## 2017-02-22 DIAGNOSIS — Z471 Aftercare following joint replacement surgery: Secondary | ICD-10-CM | POA: Diagnosis not present

## 2017-02-22 DIAGNOSIS — F419 Anxiety disorder, unspecified: Secondary | ICD-10-CM | POA: Diagnosis not present

## 2017-02-22 DIAGNOSIS — I471 Supraventricular tachycardia: Secondary | ICD-10-CM | POA: Diagnosis not present

## 2017-02-22 DIAGNOSIS — E119 Type 2 diabetes mellitus without complications: Secondary | ICD-10-CM | POA: Diagnosis not present

## 2017-02-24 ENCOUNTER — Telehealth (INDEPENDENT_AMBULATORY_CARE_PROVIDER_SITE_OTHER): Payer: Self-pay | Admitting: Radiology

## 2017-02-24 NOTE — Telephone Encounter (Signed)
Mark called to get auth for HHPT.  He is requesting 1 week x 1, 3 week x 1 and 1 week x1.  Please call him back at [phone] to advise.

## 2017-02-24 NOTE — Telephone Encounter (Signed)
I called and left voicemail to give verbal authorization for home health.

## 2017-03-03 ENCOUNTER — Other Ambulatory Visit (INDEPENDENT_AMBULATORY_CARE_PROVIDER_SITE_OTHER): Payer: Self-pay | Admitting: Orthopedic Surgery

## 2017-03-03 ENCOUNTER — Telehealth (INDEPENDENT_AMBULATORY_CARE_PROVIDER_SITE_OTHER): Payer: Self-pay | Admitting: Family

## 2017-03-03 MED ORDER — OXYCODONE-ACETAMINOPHEN 5-325 MG PO TABS: 1.0000 | ORAL_TABLET | Freq: Three times a day (TID) | ORAL | 0 refills | Status: DC | PRN

## 2017-03-03 NOTE — Telephone Encounter (Signed)
Patient called concerning Rx refill, advised patient that Rx is ready for pick up at the front desk.

## 2017-03-03 NOTE — Telephone Encounter (Signed)
rx written

## 2017-03-03 NOTE — Telephone Encounter (Signed)
Pt is s/p a right total knee on 02/19/17. Requesting refill on Oxycodone 5/325. Last rx #60 on 02/21/17

## 2017-03-03 NOTE — Telephone Encounter (Signed)
Pt aware that rx is at the front desk for pick up

## 2017-03-03 NOTE — Telephone Encounter (Signed)
Do you want to refill? 

## 2017-03-03 NOTE — Telephone Encounter (Signed)
Patient called needing Rx refilled (Oxycodone and Methocarbamol) The number to contact patient is [phone]

## 2017-03-05 ENCOUNTER — Ambulatory Visit (INDEPENDENT_AMBULATORY_CARE_PROVIDER_SITE_OTHER): Payer: Medicare Other

## 2017-03-05 ENCOUNTER — Inpatient Hospital Stay (INDEPENDENT_AMBULATORY_CARE_PROVIDER_SITE_OTHER): Payer: Medicare Other | Admitting: Orthopedic Surgery

## 2017-03-05 ENCOUNTER — Encounter (INDEPENDENT_AMBULATORY_CARE_PROVIDER_SITE_OTHER): Payer: Self-pay | Admitting: Orthopedic Surgery

## 2017-03-05 ENCOUNTER — Ambulatory Visit (INDEPENDENT_AMBULATORY_CARE_PROVIDER_SITE_OTHER): Payer: Medicare Other | Admitting: Orthopedic Surgery

## 2017-03-05 ENCOUNTER — Encounter (INDEPENDENT_AMBULATORY_CARE_PROVIDER_SITE_OTHER): Payer: Self-pay

## 2017-03-05 VITALS — Ht 62.0 in | Wt 163.0 lb

## 2017-03-05 DIAGNOSIS — Z96651 Presence of right artificial knee joint: Secondary | ICD-10-CM

## 2017-03-05 NOTE — Progress Notes (Signed)
Office Visit Note   Patient: Leslie Bryant           Date of Birth: October 20, 1941           MRN: [id number] Visit Date: 03/05/2017              Requested by: Biagio Borg, MD [address], [place] [address] PCP: [name], MD  Chief Complaint  Patient presents with  . Right Knee - Routine Post Op    02/19/17 right total knee replacement       HPI: Patient is 2 weeks status post right total knee arthroplasty she is completing physical therapy at home tomorrow and will need outpatient physical therapy.  Assessment & Plan: Visit Diagnoses:  1. S/P total knee arthroplasty, right     Plan: Patient given prescription to go to outpatient physical therapy near her home she will work on knee extension she lacks about 10 to full extension and has flexion to 100. Staples harvested today.  Follow-Up Instructions: Return in about 2 weeks (around 03/19/2017).   Ortho Exam  Patient is alert, oriented, no adenopathy, well-dressed, normal affect, normal respiratory effort. Examination the knee has a little bit of swelling there is no synovitis no drainage no signs of infection. Patient has excellent range of motion she is ambulating independently. Range of motion 10-100.  Imaging: Xr Knee 1-2 Views Right  Result Date: 03/05/2017 Two-view radiographs the right knee shows a congruent joint space no complicating features.   Labs: Lab Results  Component Value Date   HGBA1C 6.2 (H) 02/10/2017   HGBA1C 6.8 (H) 11/06/2016   ESRSEDRATE 44 (H) 07/23/2012   REPTSTATUS 11/10/2011 FINAL 11/07/2011   GRAMSTAIN  11/07/2011    ABUNDANT WBC PRESENT, PREDOMINANTLY PMN NO SQUAMOUS EPITHELIAL CELLS SEEN ABUNDANT GRAM POSITIVE COCCI IN PAIRS AND CHAINS   CULT  11/07/2011    MODERATE MICROAEROPHILIC STREPTOCOCCI Note: Standardized susceptibility testing for this organism is not available.   LABORGA NO GROWTH 04/30/2016    Orders:  Orders Placed This Encounter  Procedures  . XR  Knee 1-2 Views Right   No orders of the defined types were placed in this encounter.    Procedures: No procedures performed  Clinical Data: No additional findings.  ROS:  All other systems negative, except as noted in the HPI. Review of Systems  Objective: Vital Signs: Ht 5\' 2"  (1.575 m)   Wt 163 lb (73.9 kg)   BMI 29.81 kg/m   Specialty Comments:  No specialty comments available.  PMFS History: Patient Active Problem List   Diagnosis Date Noted  . Total knee replacement status, right 02/19/2017  . Bilateral otitis media with effusion 01/23/2017  . Preoperative cardiovascular examination 12/24/2016  . Cough 11/06/2016  . Hyponatremia 11/06/2016  . Diabetes ([place]) 11/06/2016  . Dysuria 04/30/2016  . Plantar fasciitis of right foot 12/28/2014  . Metatarsal deformity 12/28/2014  . Unspecified deficiency anemia 06/09/2013  . Anemia, unspecified 05/06/2013  . Lower back pain 02/01/2013  . Osteoarthritis of left knee 09/08/2012  . Complex tear of medial meniscus of left knee as current injury 09/08/2012  . Lower abdominal pain 07/23/2012  . Encounter for well adult exam with abnormal findings 05/05/2012  . Family hx of colon cancer 05/05/2012  . Unilateral primary osteoarthritis, right knee 02/06/2011  . History of breast cancer 02/06/2011  . Palpitations   . Supraventricular tachycardia ([place])   . EUSTACHIAN TUBE DYSFUNCTION, LEFT 04/11/2010  . Anxiety state  05/05/2008  . Essential hypertension 05/05/2008  . Allergic rhinitis 05/05/2008  . IBS 05/05/2008  . OSTEOPENIA 05/05/2008  . COLONIC POLYPS, HX OF 05/05/2008  . Hyperlipidemia 05/29/2007  . GERD 05/29/2007   Past Medical History:  Diagnosis Date  . Acute meniscal tear of knee LEFT KNEE  . Arthritis    "knees" (02/19/2017)  . Breast cancer, right breast ([place]) 1986  . 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 Diverticulitis of large intestine with perforation 11/02/2011   Microperforation probably related to  nut and popcorn consumption.  Resolved by CT scan Recurrent episode clinical dx 07/2012   . Diverticulosis of colon    sigmoid  . GERD (gastroesophageal reflux disease)   . H/O hiatal hernia   . History of kidney stones    "passed it" (02/19/2017)  . Hypercholesterolemia    history  . Left knee pain    occasionally  . Palpitations IRREGULAR HEARTBEAT--  CONTROLLED W/  BETA BLOCKER  . Pneumonia 2017   "walking pneumonia" (02/19/2017)  . Pre-diabetes    pcp is monitoring a1c currently 02/10/17  . Swelling of left knee joint     Family History  Problem Relation Age of Onset  . Heart attack Father   . Heart failure Sister   . Colon cancer Brother     died/age 68  . Lung cancer Sister   . Brain cancer Sister   . Cancer Brother     mouth  . Leukemia Brother     Past Surgical History:  Procedure Laterality Date  . BREAST BIOPSY Right 1986  . BREAST RECONSTRUCTION Right 1987   W/ IMPLANTS  . CARDIAC CATHETERIZATION  12-22-2000  DR BRACKBILL   NORMAL LVF/ NORMAL CORONARY ARTERIES  . CHOLECYSTECTOMY OPEN  1988  . CHONDROPLASTY  09/08/2012   Procedure: CHONDROPLASTY;  Surgeon: Tobi Bastos, MD;  Location: [facility];  Service: Orthopedics;  Laterality: Left;  . COLONOSCOPY     last colon 05-03-2009 (02/19/2017)  . COLONOSCOPY W/ BIOPSIES AND POLYPECTOMY    . KNEE ARTHROSCOPY WITH MEDIAL MENISECTOMY  09/08/2012   Procedure: KNEE ARTHROSCOPY WITH MEDIAL MENISECTOMY;  Surgeon: Tobi Bastos, MD;  Location: [place];  Service: Orthopedics;  Laterality: Left;  lateral tibial plateau,   . MASTECTOMY Right 1986   W/ MULTIPLE NODE DISSECTION  . REDUCTION MAMMAPLASTY Left 1987  . TOTAL KNEE ARTHROPLASTY Left 07/21/2013   Procedure: TOTAL KNEE ARTHROPLASTY;  Surgeon: Newt Minion, MD;  Location: [place];  Service: Orthopedics;  Laterality: Left;  Left Total Knee Arthroplasty  . TOTAL KNEE ARTHROPLASTY Right 02/19/2017  . TOTAL KNEE ARTHROPLASTY Right  02/19/2017   Procedure: RIGHT TOTAL KNEE ARTHROPLASTY;  Surgeon: Newt Minion, MD;  Location: [place];  Service: Orthopedics;  Laterality: Right;  Marland Kitchen VAGINAL HYSTERECTOMY  1966   Social History   Occupational History  . Retired    Social History Main Topics  . Smoking status: Never Smoker  . Smokeless tobacco: Never Used  . Alcohol use 0.6 oz/week    1 Glasses of wine per week  . Drug use: No  . Sexual activity: Yes

## 2017-03-12 ENCOUNTER — Ambulatory Visit: Payer: Medicare Other | Attending: Orthopedic Surgery | Admitting: Rehabilitation

## 2017-03-12 ENCOUNTER — Encounter: Payer: Self-pay | Admitting: Rehabilitation

## 2017-03-12 DIAGNOSIS — R262 Difficulty in walking, not elsewhere classified: Secondary | ICD-10-CM

## 2017-03-12 DIAGNOSIS — M25561 Pain in right knee: Secondary | ICD-10-CM | POA: Diagnosis not present

## 2017-03-12 DIAGNOSIS — M25661 Stiffness of right knee, not elsewhere classified: Secondary | ICD-10-CM | POA: Diagnosis not present

## 2017-03-12 DIAGNOSIS — R600 Localized edema: Secondary | ICD-10-CM | POA: Insufficient documentation

## 2017-03-12 NOTE — Patient Instructions (Addendum)
Scar Massage  Scar massage is done to improve the mobility of scar, decrease scar tissue from building up, reduce adhesions, and prevent Keloids from forming. Start scar massage after scabs have fallen off by themselves and no open areas. The first few weeks after surgery, it is normal for a scar to appear pink or red and slightly raised. Scars can itch or have areas of numbness. Some scars may be sensitive.   Direct Scar massage: after scar is healed, no opening, no scab 1.  Place pads of two fingers together directly on the scar starting at one end of the scar. Move the fingers up and down across the scar holding 5 seconds one direction.  Then go opposite direction hold 5 seconds.  2. Move over to the next section of the scar and repeat.  Work your way along the entire length of the scar.   3. Next make diagonal movements along the scar holding 5 seconds at one direction. 4. Next movement is side to side. 5. Do not rub fingers over the scar.  Instead keep firm pressure and move scar over the tissue it is on top   Scar Lift and Roll 12 weeks after surgery. 1. Pinch a small amount of the scar between your first two fingers and thumb.  2. Roll the scar between your fingers for 5 to 15 seconds. 3. Move along the scar and repeat until you have massaged the entire length of scar.   Stop the massage and call your doctor if you notice: 1. Increased redness 2. Bleeding from scar 3. Seepage coming from the scar 4. Scar is warmer and has increased pain    

## 2017-03-12 NOTE — Therapy (Signed)
[place] [address] [place], [place], [address] Phone: [phone]   Fax:  [phone]  Physical Therapy Evaluation  Patient Details  Name: CLYDIA NIEVES MRN: [id number] Date of Birth: 08/11/1941 Referring Provider: Meridee Score MD  Encounter Date: 03/12/2017      PT End of Session - 03/12/17 0923    Visit Number 1   Date for PT Re-Evaluation 04/23/17   PT Start Time 0845   PT Stop Time 0930   PT Time Calculation (min) 45 min   Activity Tolerance Patient tolerated treatment well      Past Medical History:  Diagnosis Date  . Acute meniscal tear of knee LEFT KNEE  . Arthritis    "knees" (02/19/2017)  . Breast cancer, right breast ([place]) 1986  . 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 Diverticulitis of large intestine with perforation 11/02/2011   Microperforation probably related to nut and popcorn consumption.  Resolved by CT scan Recurrent episode clinical dx 07/2012   . Diverticulosis of colon    sigmoid  . GERD (gastroesophageal reflux disease)   . H/O hiatal hernia   . History of kidney stones    "passed it" (02/19/2017)  . Hypercholesterolemia    history  . Left knee pain    occasionally  . Palpitations IRREGULAR HEARTBEAT--  CONTROLLED W/  BETA BLOCKER  . Pneumonia 2017   "walking pneumonia" (02/19/2017)  . Pre-diabetes    pcp is monitoring a1c currently 02/10/17  . Swelling of left knee joint     Past Surgical History:  Procedure Laterality Date  . BREAST BIOPSY Right 1986  . BREAST RECONSTRUCTION Right 1987   W/ IMPLANTS  . CARDIAC CATHETERIZATION  12-22-2000  DR BRACKBILL   NORMAL LVF/ NORMAL CORONARY ARTERIES  . CHOLECYSTECTOMY OPEN  1988  . CHONDROPLASTY  09/08/2012   Procedure: CHONDROPLASTY;  Surgeon: Tobi Bastos, MD;  Location: [facility];  Service: Orthopedics;  Laterality: Left;  . COLONOSCOPY     last colon 05-03-2009 (02/19/2017)  . COLONOSCOPY W/ BIOPSIES AND  POLYPECTOMY    . KNEE ARTHROSCOPY WITH MEDIAL MENISECTOMY  09/08/2012   Procedure: KNEE ARTHROSCOPY WITH MEDIAL MENISECTOMY;  Surgeon: Tobi Bastos, MD;  Location: [facility];  Service: Orthopedics;  Laterality: Left;  lateral tibial plateau,   . MASTECTOMY Right 1986   W/ MULTIPLE NODE DISSECTION  . REDUCTION MAMMAPLASTY Left 1987  . TOTAL KNEE ARTHROPLASTY Left 07/21/2013   Procedure: TOTAL KNEE ARTHROPLASTY;  Surgeon: Newt Minion, MD;  Location: [facility];  Service: Orthopedics;  Laterality: Left;  Left Total Knee Arthroplasty  . TOTAL KNEE ARTHROPLASTY Right 02/19/2017  . TOTAL KNEE ARTHROPLASTY Right 02/19/2017   Procedure: RIGHT TOTAL KNEE ARTHROPLASTY;  Surgeon: Newt Minion, MD;  Location: [place];  Service: Orthopedics;  Laterality: Right;  Marland Kitchen VAGINAL HYSTERECTOMY  1966    There were no vitals filed for this visit.       Subjective Assessment - 03/12/17 0845    Subjective Pt presents s/p R TKR on 4/18.  HHPT x 2 weeks.  No longer using AD and reports doing very well.  Does have a SPC.  Only has 2 stairs in the house reports no issues.     Limitations Walking;House hold activities   How long can you walk comfortably? not able to walk the dog like normal; maybe walk 1 block.     Patient Stated Goals  Maximize strength in the legs   Currently in Pain? Yes   Pain Score 4    Pain Orientation Right   Pain Descriptors / Indicators Aching   Pain Type Surgical pain   Pain Onset 1 to 4 weeks ago   Aggravating Factors  too much activity   Pain Relieving Factors ice, rest            [facility] PT Assessment - 03/12/17 0001      Assessment   Medical Diagnosis R TKR   Referring Provider Meridee Score MD   Onset Date/Surgical Date 02/19/17   Next MD Visit 03/19/17   Prior Therapy --     Precautions   Precautions None     Restrictions   Weight Bearing Restrictions No     Balance Screen   Has the patient fallen in the past 6 months No   Has the patient had a  decrease in activity level because of a fear of falling?  No     Home Ecologist residence     Prior Function   Level of Independence Independent     Observation/Other Assessments   Focus on Therapeutic Outcomes (FOTO)  49% limited      Sensation   Additional Comments some lateral numbness     Functional Tests   Functional tests Sit to Stand;Step up     ROM / Strength   AROM / PROM / Strength AROM;PROM;Strength     AROM   Overall AROM Comments supine: 18-110; L leg 116     PROM   Overall PROM Comments supine: 15-110     Strength   Strength Assessment Site Hip;Knee   Right/Left Hip Right;Left   Right Hip Flexion 4/5   Right Hip ABduction 4/5   Left Hip Flexion 4-/5   Left Hip ABduction 4/5   Right/Left Knee Right;Left   Right Knee Flexion 4/5   Right Knee Extension 4/5  lacking 15degrees LAQ   Left Knee Flexion 4/5   Left Knee Extension 4/5     Palpation   Patella mobility decreased sup/inf   Palpation comment decreased scar mobility distal      Ambulation/Gait   Gait Comments decreased TKE with swing                   [facility] Adult PT Treatment/Exercise - 03/12/17 0001      Exercises   Exercises Knee/Hip     Knee/Hip Exercises: Aerobic   Stationary Bike x46min for ROM                PT Education - 03/12/17 0922    Education provided Yes   Education Details POC, diagnosis, HEP, scar massage   Person(s) Educated Patient   Methods Explanation;Demonstration;Tactile cues;Verbal cues;Handout   Comprehension Verbalized understanding;Returned demonstration          PT Short Term Goals - 03/12/17 0930      PT SHORT TERM GOAL #1   Title Pt will be ind with initial HEP   Time 2   Period Weeks   Status New           PT Long Term Goals - 03/12/17 0930      PT LONG TERM GOAL #1   Title pt will improve R knee AROM supine to 0-116 to match the opposite surgical LE   Time 6   Period Weeks   Status New      PT LONG TERM GOAL #  2   Title pt will ambulate to the end of her street walking her dog without limitations   Time 6   Period Weeks   Status New     PT LONG TERM GOAL #3   Title pt will improve LE strength to 4+/5 at the knee   Time 6   Period Weeks   Status New               Plan - 03/12/17 0370    Clinical Impression Statement Pt presents s/p R TKR in excellent status overall.  Demonstrating some decreased knee ROM, lack of TKE/quad weakness, and decreased walking tolerance.     Rehab Potential Excellent   PT Frequency 2x / week   PT Duration 6 weeks   PT Treatment/Interventions Electrical Stimulation;Cryotherapy;Stair training;Gait training;Therapeutic activities;Therapeutic exercise;Neuromuscular re-education;Patient/family education;Scar mobilization;Passive range of motion;Manual techniques;Taping   PT Next Visit Plan R knee ROM, strengthening, gait   Consulted and Agree with Plan of Care Patient      Patient will benefit from skilled therapeutic intervention in order to improve the following deficits and impairments:  Abnormal gait, Decreased range of motion, Increased edema, Decreased strength, Pain  Visit Diagnosis: Acute pain of right knee - Plan: PT plan of care cert/re-cert  Stiffness of right knee, not elsewhere classified - Plan: PT plan of care cert/re-cert  Localized edema - Plan: PT plan of care cert/re-cert  Difficulty in walking, not elsewhere classified - Plan: PT plan of care cert/re-cert      G-Codes - 48/88/91 0932    Functional Assessment Tool Used (Outpatient Only) FOTO 49%   Functional Limitation Mobility: Walking and moving around   Mobility: Walking and Moving Around Current Status (Q9450) At least 40 percent but less than 60 percent impaired, limited or restricted   Mobility: Walking and Moving Around Goal Status [phone]) At least 20 percent but less than 40 percent impaired, limited or restricted       Problem List Patient  Active Problem List   Diagnosis Date Noted  . Total knee replacement status, right 02/19/2017  . Bilateral otitis media with effusion 01/23/2017  . Preoperative cardiovascular examination 12/24/2016  . Cough 11/06/2016  . Hyponatremia 11/06/2016  . Diabetes ([place]) 11/06/2016  . Dysuria 04/30/2016  . Plantar fasciitis of right foot 12/28/2014  . Metatarsal deformity 12/28/2014  . Unspecified deficiency anemia 06/09/2013  . Anemia, unspecified 05/06/2013  . Lower back pain 02/01/2013  . Osteoarthritis of left knee 09/08/2012  . Complex tear of medial meniscus of left knee as current injury 09/08/2012  . Lower abdominal pain 07/23/2012  . Encounter for well adult exam with abnormal findings 05/05/2012  . Family hx of colon cancer 05/05/2012  . Unilateral primary osteoarthritis, right knee 02/06/2011  . History of breast cancer 02/06/2011  . Palpitations   . Supraventricular tachycardia ([place])   . EUSTACHIAN TUBE DYSFUNCTION, LEFT 04/11/2010  . Anxiety state 05/05/2008  . Essential hypertension 05/05/2008  . Allergic rhinitis 05/05/2008  . IBS 05/05/2008  . OSTEOPENIA 05/05/2008  . COLONIC POLYPS, HX OF 05/05/2008  . Hyperlipidemia 05/29/2007  . GERD 05/29/2007    Stark Bray, DPT, CMP 03/12/2017, 9:35 AM  [address] [place], [place], [address] Phone: [phone]   Fax:  [phone]  Name: BRETTANY SYDNEY MRN: [id number] Date of Birth: May 25, 1941

## 2017-03-14 ENCOUNTER — Encounter: Payer: Self-pay | Admitting: Physical Therapy

## 2017-03-14 ENCOUNTER — Ambulatory Visit: Payer: Medicare Other | Admitting: Physical Therapy

## 2017-03-14 DIAGNOSIS — M25661 Stiffness of right knee, not elsewhere classified: Secondary | ICD-10-CM | POA: Diagnosis not present

## 2017-03-14 DIAGNOSIS — M25561 Pain in right knee: Secondary | ICD-10-CM | POA: Diagnosis not present

## 2017-03-14 DIAGNOSIS — R600 Localized edema: Secondary | ICD-10-CM | POA: Diagnosis not present

## 2017-03-14 DIAGNOSIS — R262 Difficulty in walking, not elsewhere classified: Secondary | ICD-10-CM | POA: Diagnosis not present

## 2017-03-14 NOTE — Therapy (Signed)
[place], [place], [address] Phone: [phone]   Fax:  [phone]  Physical Therapy Treatment  Patient Details  Name: Leslie Bryant MRN: [id number] Date of Birth: 1941-08-01 Referring Provider: Meridee Score MD  Encounter Date: 03/14/2017      PT End of Session - 03/14/17 1005    Visit Number 2   Date for PT Re-Evaluation 04/23/17   PT Start Time 0932   PT Stop Time 1030   PT Time Calculation (min) 58 min      Past Medical History:  Diagnosis Date  . Acute meniscal tear of knee LEFT KNEE  . Arthritis    "knees" (02/19/2017)  . Breast cancer, right breast ([place]) 1986  . 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 Diverticulitis of large intestine with perforation 11/02/2011   Microperforation probably related to nut and popcorn consumption.  Resolved by CT scan Recurrent episode clinical dx 07/2012   . Diverticulosis of colon    sigmoid  . GERD (gastroesophageal reflux disease)   . H/O hiatal hernia   . History of kidney stones    "passed it" (02/19/2017)  . Hypercholesterolemia    history  . Left knee pain    occasionally  . Palpitations IRREGULAR HEARTBEAT--  CONTROLLED W/  BETA BLOCKER  . Pneumonia 2017   "walking pneumonia" (02/19/2017)  . Pre-diabetes    pcp is monitoring a1c currently 02/10/17  . Swelling of left knee joint     Past Surgical History:  Procedure Laterality Date  . BREAST BIOPSY Right 1986  . BREAST RECONSTRUCTION Right 1987   W/ IMPLANTS  . CARDIAC CATHETERIZATION  12-22-2000  DR BRACKBILL   NORMAL LVF/ NORMAL CORONARY ARTERIES  . CHOLECYSTECTOMY OPEN  1988  . CHONDROPLASTY  09/08/2012   Procedure: CHONDROPLASTY;  Surgeon: Tobi Bastos, MD;  Location: [facility];  Service: Orthopedics;  Laterality: Left;  . COLONOSCOPY     last colon 05-03-2009 (02/19/2017)  . COLONOSCOPY W/ BIOPSIES AND POLYPECTOMY    . KNEE ARTHROSCOPY WITH MEDIAL MENISECTOMY   09/08/2012   Procedure: KNEE ARTHROSCOPY WITH MEDIAL MENISECTOMY;  Surgeon: Tobi Bastos, MD;  Location: [facility];  Service: Orthopedics;  Laterality: Left;  lateral tibial plateau,   . MASTECTOMY Right 1986   W/ MULTIPLE NODE DISSECTION  . REDUCTION MAMMAPLASTY Left 1987  . TOTAL KNEE ARTHROPLASTY Left 07/21/2013   Procedure: TOTAL KNEE ARTHROPLASTY;  Surgeon: Newt Minion, MD;  Location: [facility];  Service: Orthopedics;  Laterality: Left;  Left Total Knee Arthroplasty  . TOTAL KNEE ARTHROPLASTY Right 02/19/2017  . TOTAL KNEE ARTHROPLASTY Right 02/19/2017   Procedure: RIGHT TOTAL KNEE ARTHROPLASTY;  Surgeon: Newt Minion, MD;  Location: [facility];  Service: Orthopedics;  Laterality: Right;  Marland Kitchen VAGINAL HYSTERECTOMY  1966    There were no vitals filed for this visit.      Subjective Assessment - 03/14/17 0937    Subjective doing great, amb in without AD. verb doing HEP from HHPT and from eval    Currently in Pain? Yes   Pain Score 3    Pain Location Knee   Pain Orientation Right   Pain Descriptors / Indicators Aching            [facility] PT Assessment - 03/14/17 0001      AROM   Overall AROM Comments seated AROM 5- 115  [facility] Adult PT Treatment/Exercise - 03/14/17 0001      Knee/Hip Exercises: Aerobic   Stationary Bike x76min for ROM   Nustep L 6 6 min LE only     Knee/Hip Exercises: Machines for Strengthening   Cybex Knee Flexion 15# 2 sets 10   Cybex Leg Press 30# 3 sets 10  calf raises 30# 2 sets 15     Knee/Hip Exercises: Standing   Lateral Step Up Right;1 set;15 reps;Hand Hold: 2;Step Height: 6"   Forward Step Up Right;1 set;15 reps;Hand Hold: 2;Step Height: 6"   Other Standing Knee Exercises TKE with ball on wall 20 times     Knee/Hip Exercises: Seated   Long Arc Quad Right;2 sets;10 reps;Weights  hold 3 sec in ext   [employer] Weight 3 lbs.     Modalities   Modalities Vasopneumatic     Vasopneumatic   Number  Minutes Vasopneumatic  15 minutes   Vasopnuematic Location  Knee   Vasopneumatic Pressure Medium                PT Education - 03/14/17 1005    Education provided Yes   Education Details TKE hold with ther ex   Person(s) Educated Patient   Methods Explanation;Demonstration   Comprehension Verbalized understanding;Returned demonstration          PT Short Term Goals - 03/14/17 1009      PT SHORT TERM GOAL #1   Title Pt will be ind with initial HEP           PT Long Term Goals - 03/12/17 0930      PT LONG TERM GOAL #1   Title pt will improve R knee AROM supine to 0-116 to match the opposite surgical LE   Time 6   Period Weeks   Status New     PT LONG TERM GOAL #2   Title pt will ambulate to the end of her street walking her dog without limitations   Time 6   Period Weeks   Status New     PT LONG TERM GOAL #3   Title pt will improve LE strength to 4+/5 at the knee   Time 6   Period Weeks   Status New               Plan - 03/14/17 1008    Clinical Impression Statement excellent progress with ROM and func- working on [employer] and quad strength   PT Next Visit Plan quad strength      Patient will benefit from skilled therapeutic intervention in order to improve the following deficits and impairments:  Abnormal gait, Decreased range of motion, Increased edema, Decreased strength, Pain  Visit Diagnosis: Acute pain of right knee  Stiffness of right knee, not elsewhere classified  Localized edema     Problem List Patient Active Problem List   Diagnosis Date Noted  . Total knee replacement status, right 02/19/2017  . Bilateral otitis media with effusion 01/23/2017  . Preoperative cardiovascular examination 12/24/2016  . Cough 11/06/2016  . Hyponatremia 11/06/2016  . Diabetes ([place]) 11/06/2016  . Dysuria 04/30/2016  . Plantar fasciitis of right foot 12/28/2014  . Metatarsal deformity 12/28/2014  . Unspecified deficiency anemia 06/09/2013  .  Anemia, unspecified 05/06/2013  . Lower back pain 02/01/2013  . Osteoarthritis of left knee 09/08/2012  . Complex tear of medial meniscus of left knee as current injury 09/08/2012  . Lower abdominal pain 07/23/2012  . Encounter for well  adult exam with abnormal findings 05/05/2012  . Family hx of colon cancer 05/05/2012  . Unilateral primary osteoarthritis, right knee 02/06/2011  . History of breast cancer 02/06/2011  . Palpitations   . Supraventricular tachycardia ([place])   . EUSTACHIAN TUBE DYSFUNCTION, LEFT 04/11/2010  . Anxiety state 05/05/2008  . Essential hypertension 05/05/2008  . Allergic rhinitis 05/05/2008  . IBS 05/05/2008  . OSTEOPENIA 05/05/2008  . COLONIC POLYPS, HX OF 05/05/2008  . Hyperlipidemia 05/29/2007  . GERD 05/29/2007    PAYSEUR,ANGIE PTA 03/14/2017, 10:10 AM  [address] [place], [place], [address] Phone: [phone]   Fax:  [phone]  Name: ELIANYS CONRY MRN: [id number] Date of Birth: Feb 26, 1941

## 2017-03-18 ENCOUNTER — Ambulatory Visit: Payer: Medicare Other | Admitting: Physical Therapy

## 2017-03-18 ENCOUNTER — Encounter: Payer: Self-pay | Admitting: Physical Therapy

## 2017-03-18 DIAGNOSIS — R262 Difficulty in walking, not elsewhere classified: Secondary | ICD-10-CM | POA: Diagnosis not present

## 2017-03-18 DIAGNOSIS — R600 Localized edema: Secondary | ICD-10-CM | POA: Diagnosis not present

## 2017-03-18 DIAGNOSIS — M25661 Stiffness of right knee, not elsewhere classified: Secondary | ICD-10-CM | POA: Diagnosis not present

## 2017-03-18 DIAGNOSIS — M25561 Pain in right knee: Secondary | ICD-10-CM

## 2017-03-18 NOTE — Therapy (Signed)
[place], [place], [address] Phone: [phone]   Fax:  [phone]  Physical Therapy Treatment  Patient Details  Name: Leslie Bryant MRN: [id number] Date of Birth: 30-May-1941 Referring Provider: Meridee Score MD  Encounter Date: 03/18/2017      PT End of Session - 03/18/17 1002    Visit Number 3   Date for PT Re-Evaluation 04/23/17   PT Start Time 0930   PT Stop Time 1025   PT Time Calculation (min) 55 min      Past Medical History:  Diagnosis Date  . Acute meniscal tear of knee LEFT KNEE  . Arthritis    "knees" (02/19/2017)  . Breast cancer, right breast ([place]) 1986  . 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 Diverticulitis of large intestine with perforation 11/02/2011   Microperforation probably related to nut and popcorn consumption.  Resolved by CT scan Recurrent episode clinical dx 07/2012   . Diverticulosis of colon    sigmoid  . GERD (gastroesophageal reflux disease)   . H/O hiatal hernia   . History of kidney stones    "passed it" (02/19/2017)  . Hypercholesterolemia    history  . Left knee pain    occasionally  . Palpitations IRREGULAR HEARTBEAT--  CONTROLLED W/  BETA BLOCKER  . Pneumonia 2017   "walking pneumonia" (02/19/2017)  . Pre-diabetes    pcp is monitoring a1c currently 02/10/17  . Swelling of left knee joint     Past Surgical History:  Procedure Laterality Date  . BREAST BIOPSY Right 1986  . BREAST RECONSTRUCTION Right 1987   W/ IMPLANTS  . CARDIAC CATHETERIZATION  12-22-2000  DR BRACKBILL   NORMAL LVF/ NORMAL CORONARY ARTERIES  . CHOLECYSTECTOMY OPEN  1988  . CHONDROPLASTY  09/08/2012   Procedure: CHONDROPLASTY;  Surgeon: Tobi Bastos, MD;  Location: [facility];  Service: Orthopedics;  Laterality: Left;  . COLONOSCOPY     last colon 05-03-2009 (02/19/2017)  . COLONOSCOPY W/ BIOPSIES AND POLYPECTOMY    . KNEE ARTHROSCOPY WITH MEDIAL MENISECTOMY   09/08/2012   Procedure: KNEE ARTHROSCOPY WITH MEDIAL MENISECTOMY;  Surgeon: Tobi Bastos, MD;  Location: [facility];  Service: Orthopedics;  Laterality: Left;  lateral tibial plateau,   . MASTECTOMY Right 1986   W/ MULTIPLE NODE DISSECTION  . REDUCTION MAMMAPLASTY Left 1987  . TOTAL KNEE ARTHROPLASTY Left 07/21/2013   Procedure: TOTAL KNEE ARTHROPLASTY;  Surgeon: Newt Minion, MD;  Location: [facility];  Service: Orthopedics;  Laterality: Left;  Left Total Knee Arthroplasty  . TOTAL KNEE ARTHROPLASTY Right 02/19/2017  . TOTAL KNEE ARTHROPLASTY Right 02/19/2017   Procedure: RIGHT TOTAL KNEE ARTHROPLASTY;  Surgeon: Newt Minion, MD;  Location: [facility];  Service: Orthopedics;  Laterality: Right;  Marland Kitchen VAGINAL HYSTERECTOMY  1966    There were no vitals filed for this visit.      Subjective Assessment - 03/18/17 0928    Subjective alittel sore after last session but not bad   Currently in Pain? Yes   Pain Score 2    Pain Location Knee   Pain Orientation Right                         [facility] Adult PT Treatment/Exercise - 03/18/17 0001      Knee/Hip Exercises: Aerobic   Stationary Bike x59min for ROM   Elliptical 2 fwd/2 back  I 5 R 3   Nustep L 6 6 min LE only     Knee/Hip Exercises: Machines for Strengthening   Cybex Knee Extension 5# 2 sets 10  RT only as BLE increase Left knee pain   Cybex Knee Flexion 15# 2 sets 15   Cybex Leg Press 30# 3 sets 10  calf raises 2 sets 15 30#     Knee/Hip Exercises: Standing   Lateral Step Up Right;1 set;15 reps;Hand Hold: 2;Step Height: 6"   Forward Step Up Right;1 set;15 reps;Hand Hold: 2;Step Height: 6"     Modalities   Modalities Vasopneumatic     Vasopneumatic   Number Minutes Vasopneumatic  15 minutes   Vasopnuematic Location  Knee   Vasopneumatic Pressure Medium                  PT Short Term Goals - 03/14/17 1009      PT SHORT TERM GOAL #1   Title Pt will be ind with initial HEP            PT Long Term Goals - 03/12/17 0930      PT LONG TERM GOAL #1   Title pt will improve R knee AROM supine to 0-116 to match the opposite surgical LE   Time 6   Period Weeks   Status New     PT LONG TERM GOAL #2   Title pt will ambulate to the end of her street walking her dog without limitations   Time 6   Period Weeks   Status New     PT LONG TERM GOAL #3   Title pt will improve LE strength to 4+/5 at the knee   Time 6   Period Weeks   Status New               Plan - 03/18/17 1002    Clinical Impression Statement pt with improved ROM ( TKE) and strength. tolerated ther ex well.   PT Next Visit Plan TKE strength and ROM for func ADLs      Patient will benefit from skilled therapeutic intervention in order to improve the following deficits and impairments:  Abnormal gait, Decreased range of motion, Increased edema, Decreased strength, Pain  Visit Diagnosis: Acute pain of right knee  Stiffness of right knee, not elsewhere classified  Localized edema     Problem List Patient Active Problem List   Diagnosis Date Noted  . Total knee replacement status, right 02/19/2017  . Bilateral otitis media with effusion 01/23/2017  . Preoperative cardiovascular examination 12/24/2016  . Cough 11/06/2016  . Hyponatremia 11/06/2016  . Diabetes ([place]) 11/06/2016  . Dysuria 04/30/2016  . Plantar fasciitis of right foot 12/28/2014  . Metatarsal deformity 12/28/2014  . Unspecified deficiency anemia 06/09/2013  . Anemia, unspecified 05/06/2013  . Lower back pain 02/01/2013  . Osteoarthritis of left knee 09/08/2012  . Complex tear of medial meniscus of left knee as current injury 09/08/2012  . Lower abdominal pain 07/23/2012  . Encounter for well adult exam with abnormal findings 05/05/2012  . Family hx of colon cancer 05/05/2012  . Unilateral primary osteoarthritis, right knee 02/06/2011  . History of breast cancer 02/06/2011  . Palpitations   . Supraventricular  tachycardia ([place])   . EUSTACHIAN TUBE DYSFUNCTION, LEFT 04/11/2010  . Anxiety state 05/05/2008  . Essential hypertension 05/05/2008  . Allergic rhinitis 05/05/2008  . IBS 05/05/2008  . OSTEOPENIA 05/05/2008  . COLONIC POLYPS, HX OF 05/05/2008  . Hyperlipidemia  05/29/2007  . GERD 05/29/2007    PAYSEUR,ANGIE PTA 03/18/2017, 10:03 AM  [place] [address] [place], [place], [address] Phone: [phone]   Fax:  [phone]  Name: Leslie Bryant MRN: [id number] Date of Birth: 1940/11/25

## 2017-03-19 ENCOUNTER — Ambulatory Visit (INDEPENDENT_AMBULATORY_CARE_PROVIDER_SITE_OTHER): Payer: Medicare Other | Admitting: Family

## 2017-03-19 ENCOUNTER — Ambulatory Visit (INDEPENDENT_AMBULATORY_CARE_PROVIDER_SITE_OTHER): Payer: Medicare Other | Admitting: Orthopedic Surgery

## 2017-03-19 VITALS — Ht 62.0 in | Wt 163.0 lb

## 2017-03-19 DIAGNOSIS — Z96651 Presence of right artificial knee joint: Secondary | ICD-10-CM

## 2017-03-19 MED ORDER — OXYCODONE-ACETAMINOPHEN 5-325 MG PO TABS: 1.0000 | ORAL_TABLET | Freq: Three times a day (TID) | ORAL | 0 refills | Status: DC | PRN

## 2017-03-19 NOTE — Progress Notes (Signed)
Post-Op Visit Note   Patient: Leslie Bryant           Date of Birth: 10/23/1941           MRN: [id number] Visit Date: 03/19/2017 PCP: Biagio Borg, MD  Chief Complaint:  Chief Complaint  Patient presents with  . Right Knee - Routine Post Op    02/19/17 right total knee replacement     HPI:  The patient is a 76 year old woman who presents today for weeks status post right total knee replacement. She's been doing outpatient physical therapy. Is pleased with her progress. Was able to do yardwork weeding planting annuals yesterday.    Ortho Exam  Incision is well-healed there is no erythema open area redness no drainage no sign of infection. Does continue to have moderate swelling there is no warmth. She lacks 5 of extension and has flexion to 120.  Visit Diagnoses:  1. Total knee replacement status, right     Plan: Continue with outpatient physical therapy. Follow-up in office in 4 weeks.  Follow-Up Instructions: Return in about 4 weeks (around 04/16/2017).   Imaging: No results found.  Orders:  No orders of the defined types were placed in this encounter.  Meds ordered this encounter  Medications  . oxyCODONE-acetaminophen (ROXICET) 5-325 MG tablet    Sig: Take 1 tablet by mouth every 8 (eight) hours as needed for severe pain.    Dispense:  60 tablet    Refill:  0     PMFS History: Patient Active Problem List   Diagnosis Date Noted  . Total knee replacement status, right 02/19/2017  . Bilateral otitis media with effusion 01/23/2017  . Preoperative cardiovascular examination 12/24/2016  . Cough 11/06/2016  . Hyponatremia 11/06/2016  . Diabetes ([place]) 11/06/2016  . Dysuria 04/30/2016  . Plantar fasciitis of right foot 12/28/2014  . Metatarsal deformity 12/28/2014  . Unspecified deficiency anemia 06/09/2013  . Anemia, unspecified 05/06/2013  . Lower back pain 02/01/2013  . Osteoarthritis of left knee 09/08/2012  . Complex tear of medial meniscus of left knee  as current injury 09/08/2012  . Lower abdominal pain 07/23/2012  . Encounter for well adult exam with abnormal findings 05/05/2012  . Family hx of colon cancer 05/05/2012  . Unilateral primary osteoarthritis, right knee 02/06/2011  . History of breast cancer 02/06/2011  . Palpitations   . Supraventricular tachycardia ([place])   . EUSTACHIAN TUBE DYSFUNCTION, LEFT 04/11/2010  . Anxiety state 05/05/2008  . Essential hypertension 05/05/2008  . Allergic rhinitis 05/05/2008  . IBS 05/05/2008  . OSTEOPENIA 05/05/2008  . COLONIC POLYPS, HX OF 05/05/2008  . Hyperlipidemia 05/29/2007  . GERD 05/29/2007   Past Medical History:  Diagnosis Date  . Acute meniscal tear of knee LEFT KNEE  . Arthritis    "knees" (02/19/2017)  . Breast cancer, right breast ([place]) 1986  . 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 Diverticulitis of large intestine with perforation 11/02/2011   Microperforation probably related to nut and popcorn consumption.  Resolved by CT scan Recurrent episode clinical dx 07/2012   . Diverticulosis of colon    sigmoid  . GERD (gastroesophageal reflux disease)   . H/O hiatal hernia   . History of kidney stones    "passed it" (02/19/2017)  . Hypercholesterolemia    history  . Left knee pain    occasionally  . Palpitations IRREGULAR HEARTBEAT--  CONTROLLED W/  BETA BLOCKER  . Pneumonia 2017   "walking pneumonia" (  02/19/2017)  . Pre-diabetes    pcp is monitoring a1c currently 02/10/17  . Swelling of left knee joint     Family History  Problem Relation Age of Onset  . Heart attack Father   . Heart failure Sister   . Colon cancer Brother        died/age 109  . Lung cancer Sister   . Brain cancer Sister   . Cancer Brother        mouth  . Leukemia Brother     Past Surgical History:  Procedure Laterality Date  . BREAST BIOPSY Right 1986  . BREAST RECONSTRUCTION Right 1987   W/ IMPLANTS  . CARDIAC CATHETERIZATION  12-22-2000  DR BRACKBILL   NORMAL LVF/ NORMAL CORONARY ARTERIES  .  CHOLECYSTECTOMY OPEN  1988  . CHONDROPLASTY  09/08/2012   Procedure: CHONDROPLASTY;  Surgeon: Tobi Bastos, MD;  Location[facility];  Service: Orthopedics;  Laterality: Left;  . COLONOSCOPY     last colon 05-03-2009 (02/19/2017)  . COLONOSCOPY W/ BIOPSIES AND POLYPECTOMY    . KNEE ARTHROSCOPY WITH MEDIAL MENISECTOMY  09/08/2012   Procedure: KNEE ARTHROSCOPY WITH MEDIAL MENISECTOMY;  Surgeon: Tobi Bastos, MD;  Location: [place];  Service: Orthopedics;  Laterality: Left;  lateral tibial plateau,   . MASTECTOMY Right 1986   W/ MULTIPLE NODE DISSECTION  . REDUCTION MAMMAPLASTY Left 1987  . TOTAL KNEE ARTHROPLASTY Left 07/21/2013   Procedure: TOTAL KNEE ARTHROPLASTY;  Surgeon: Newt Minion, MD;  Location: [place];  Service: Orthopedics;  Laterality: Left;  Left Total Knee Arthroplasty  . TOTAL KNEE ARTHROPLASTY Right 02/19/2017  . TOTAL KNEE ARTHROPLASTY Right 02/19/2017   Procedure: RIGHT TOTAL KNEE ARTHROPLASTY;  Surgeon: Newt Minion, MD;  Location: [place];  Service: Orthopedics;  Laterality: Right;  Marland Kitchen VAGINAL HYSTERECTOMY  1966   Social History   Occupational History  . Retired    Social History Main Topics  . Smoking status: Never Smoker  . Smokeless tobacco: Never Used  . Alcohol use 0.6 oz/week    1 Glasses of wine per week  . Drug use: No  . Sexual activity: Yes

## 2017-03-20 ENCOUNTER — Encounter: Payer: Medicare Other | Admitting: Physical Therapy

## 2017-03-21 ENCOUNTER — Encounter: Payer: Self-pay | Admitting: Physical Therapy

## 2017-03-21 ENCOUNTER — Ambulatory Visit: Payer: Medicare Other | Admitting: Physical Therapy

## 2017-03-21 DIAGNOSIS — R600 Localized edema: Secondary | ICD-10-CM

## 2017-03-21 DIAGNOSIS — M25661 Stiffness of right knee, not elsewhere classified: Secondary | ICD-10-CM | POA: Diagnosis not present

## 2017-03-21 DIAGNOSIS — R262 Difficulty in walking, not elsewhere classified: Secondary | ICD-10-CM

## 2017-03-21 DIAGNOSIS — M25561 Pain in right knee: Secondary | ICD-10-CM

## 2017-03-21 NOTE — Therapy (Signed)
[place], [place], [address] Phone: [phone]   Fax:  [phone]  Physical Therapy Treatment  Patient Details  Name: Leslie Bryant MRN: [id number] Date of Birth: May 21, 1941 Referring Provider: Meridee Score MD  Encounter Date: 03/21/2017      PT End of Session - 03/21/17 1010    Visit Number 4   PT Start Time 0933   PT Stop Time 3007   PT Time Calculation (min) 57 min      Past Medical History:  Diagnosis Date  . Acute meniscal tear of knee LEFT KNEE  . Arthritis    "knees" (02/19/2017)  . Breast cancer, right breast ([place]) 1986  . 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 Diverticulitis of large intestine with perforation 11/02/2011   Microperforation probably related to nut and popcorn consumption.  Resolved by CT scan Recurrent episode clinical dx 07/2012   . Diverticulosis of colon    sigmoid  . GERD (gastroesophageal reflux disease)   . H/O hiatal hernia   . History of kidney stones    "passed it" (02/19/2017)  . Hypercholesterolemia    history  . Left knee pain    occasionally  . Palpitations IRREGULAR HEARTBEAT--  CONTROLLED W/  BETA BLOCKER  . Pneumonia 2017   "walking pneumonia" (02/19/2017)  . Pre-diabetes    pcp is monitoring a1c currently 02/10/17  . Swelling of left knee joint     Past Surgical History:  Procedure Laterality Date  . BREAST BIOPSY Right 1986  . BREAST RECONSTRUCTION Right 1987   W/ IMPLANTS  . CARDIAC CATHETERIZATION  12-22-2000  DR BRACKBILL   NORMAL LVF/ NORMAL CORONARY ARTERIES  . CHOLECYSTECTOMY OPEN  1988  . CHONDROPLASTY  09/08/2012   Procedure: CHONDROPLASTY;  Surgeon: Tobi Bastos, MD;  Location: [facility];  Service: Orthopedics;  Laterality: Left;  . COLONOSCOPY     last colon 05-03-2009 (02/19/2017)  . COLONOSCOPY W/ BIOPSIES AND POLYPECTOMY    . KNEE ARTHROSCOPY WITH MEDIAL MENISECTOMY  09/08/2012   Procedure: KNEE ARTHROSCOPY  WITH MEDIAL MENISECTOMY;  Surgeon: Tobi Bastos, MD;  Location: [place];  Service: Orthopedics;  Laterality: Left;  lateral tibial plateau,   . MASTECTOMY Right 1986   W/ MULTIPLE NODE DISSECTION  . REDUCTION MAMMAPLASTY Left 1987  . TOTAL KNEE ARTHROPLASTY Left 07/21/2013   Procedure: TOTAL KNEE ARTHROPLASTY;  Surgeon: Newt Minion, MD;  Location: [place];  Service: Orthopedics;  Laterality: Left;  Left Total Knee Arthroplasty  . TOTAL KNEE ARTHROPLASTY Right 02/19/2017  . TOTAL KNEE ARTHROPLASTY Right 02/19/2017   Procedure: RIGHT TOTAL KNEE ARTHROPLASTY;  Surgeon: Newt Minion, MD;  Location: [place];  Service: Orthopedics;  Laterality: Right;  Marland Kitchen VAGINAL HYSTERECTOMY  1966    There were no vitals filed for this visit.      Subjective Assessment - 03/21/17 0932    Subjective stiffness, saw MD yesterday and very pleased   Currently in Pain? Yes   Pain Score 1             [facility] PT Assessment - 03/21/17 0001      AROM   Overall AROM Comments AROM seated 5-120                     [facility] Adult PT Treatment/Exercise - 03/21/17 0001      Knee/Hip Exercises: Aerobic   Stationary Bike  x58mn for ROM   Nustep L 6 6 min LE only     Knee/Hip Exercises: Machines for Strengthening   Cybex Knee Extension 5# 3 sets 10   Cybex Knee Flexion 20# 2 sets 15   Cybex Leg Press 40# 3 sets 10     Knee/Hip Exercises: Standing   Other Standing Knee Exercises cabel press downs for ext 2 sets 10     Vasopneumatic   Number Minutes Vasopneumatic  15 minutes   Vasopnuematic Location  Knee   Vasopneumatic Pressure Medium                PT Education - 03/21/17 1000    Education provided Yes   Education Details TKE with ball, step up FWD and SW, heel raises on step   Person(s) Educated Patient   Methods Explanation;Demonstration;Handout   Comprehension Verbalized understanding;Returned demonstration          PT Short Term Goals - 03/14/17 1009       PT SHORT TERM GOAL #1   Title Pt will be ind with initial HEP           PT Long Term Goals - 03/21/17 1010      PT LONG TERM GOAL #1   Title pt will improve R knee AROM supine to 0-116 to match the opposite surgical LE   Baseline 5-120   Status Partially Met     PT LONG TERM GOAL #2   Title pt will ambulate to the end of her street walking her dog without limitations   Status Partially Met     PT LONG TERM GOAL #3   Title pt will improve LE strength to 4+/5 at the knee   Status Partially Met               Plan - 03/21/17 1011    Clinical Impression Statement excellent ROM but quad lag. issued TKE for HEP.   PT Next Visit Plan TKE. 2 times next week then decrease to 1 time week      Patient will benefit from skilled therapeutic intervention in order to improve the following deficits and impairments:  Abnormal gait, Decreased range of motion, Increased edema, Decreased strength, Pain  Visit Diagnosis: Acute pain of right knee  Stiffness of right knee, not elsewhere classified  Localized edema  Difficulty in walking, not elsewhere classified     Problem List Patient Active Problem List   Diagnosis Date Noted  . Total knee replacement status, right 02/19/2017  . Bilateral otitis media with effusion 01/23/2017  . Preoperative cardiovascular examination 12/24/2016  . Cough 11/06/2016  . Hyponatremia 11/06/2016  . Diabetes ([place] 11/06/2016  . Dysuria 04/30/2016  . Plantar fasciitis of right foot 12/28/2014  . Metatarsal deformity 12/28/2014  . Unspecified deficiency anemia 06/09/2013  . Anemia, unspecified 05/06/2013  . Lower back pain 02/01/2013  . Osteoarthritis of left knee 09/08/2012  . Complex tear of medial meniscus of left knee as current injury 09/08/2012  . Lower abdominal pain 07/23/2012  . Encounter for well adult exam with abnormal findings 05/05/2012  . Family hx of colon cancer 05/05/2012  . Unilateral primary osteoarthritis, right  knee 02/06/2011  . History of breast cancer 02/06/2011  . Palpitations   . [place] tachycardia ([place]   . EUSTACHIAN TUBE DYSFUNCTION, LEFT 04/11/2010  . Anxiety state 05/05/2008  . Essential hypertension 05/05/2008  . Allergic rhinitis 05/05/2008  . IBS 05/05/2008  . OSTEOPENIA 05/05/2008  . COLONIC POLYPS, HX OF  05/05/2008  . Hyperlipidemia 05/29/2007  . GERD 05/29/2007    PAYSEUR,ANGIE PTA 03/21/2017, 10:12 AM  [place] [address] [place], [place], [address] Phone: [phone]   Fax:  [phone]  Name: Leslie Bryant MRN: [id number] Date of Birth: 1941/08/24

## 2017-03-25 ENCOUNTER — Ambulatory Visit: Payer: Medicare Other | Admitting: Physical Therapy

## 2017-03-25 DIAGNOSIS — R600 Localized edema: Secondary | ICD-10-CM | POA: Diagnosis not present

## 2017-03-25 DIAGNOSIS — M25561 Pain in right knee: Secondary | ICD-10-CM | POA: Diagnosis not present

## 2017-03-25 DIAGNOSIS — M25661 Stiffness of right knee, not elsewhere classified: Secondary | ICD-10-CM

## 2017-03-25 DIAGNOSIS — R262 Difficulty in walking, not elsewhere classified: Secondary | ICD-10-CM | POA: Diagnosis not present

## 2017-03-25 NOTE — Therapy (Signed)
[place], [place], [address] Phone: [phone]   Fax:  [phone]  Physical Therapy Treatment  Patient Details  Name: Leslie Bryant MRN: [id number] Date of Birth: 11-22-1940 Referring Provider: Meridee Score MD  Encounter Date: 03/25/2017      PT End of Session - 03/25/17 1439    Visit Number 5   Date for PT Re-Evaluation 04/23/17   PT Start Time 4196   PT Stop Time 1452   PT Time Calculation (min) 57 min      Past Medical History:  Diagnosis Date  . Acute meniscal tear of knee LEFT KNEE  . Arthritis    "knees" (02/19/2017)  . Breast cancer, right breast ([place]) 1986  . 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 Diverticulitis of large intestine with perforation 11/02/2011   Microperforation probably related to nut and popcorn consumption.  Resolved by CT scan Recurrent episode clinical dx 07/2012   . Diverticulosis of colon    sigmoid  . GERD (gastroesophageal reflux disease)   . H/O hiatal hernia   . History of kidney stones    "passed it" (02/19/2017)  . Hypercholesterolemia    history  . Left knee pain    occasionally  . Palpitations IRREGULAR HEARTBEAT--  CONTROLLED W/  BETA BLOCKER  . Pneumonia 2017   "walking pneumonia" (02/19/2017)  . Pre-diabetes    pcp is monitoring a1c currently 02/10/17  . Swelling of left knee joint     Past Surgical History:  Procedure Laterality Date  . BREAST BIOPSY Right 1986  . BREAST RECONSTRUCTION Right 1987   W/ IMPLANTS  . CARDIAC CATHETERIZATION  12-22-2000  DR BRACKBILL   NORMAL LVF/ NORMAL CORONARY ARTERIES  . CHOLECYSTECTOMY OPEN  1988  . CHONDROPLASTY  09/08/2012   Procedure: CHONDROPLASTY;  Surgeon: Tobi Bastos, MD;  Location: [facility];  Service: Orthopedics;  Laterality: Left;  . COLONOSCOPY     last colon 05-03-2009 (02/19/2017)  . COLONOSCOPY W/ BIOPSIES AND POLYPECTOMY    . KNEE ARTHROSCOPY WITH MEDIAL MENISECTOMY   09/08/2012   Procedure: KNEE ARTHROSCOPY WITH MEDIAL MENISECTOMY;  Surgeon: Tobi Bastos, MD;  Location: [place];  Service: Orthopedics;  Laterality: Left;  lateral tibial plateau,   . MASTECTOMY Right 1986   W/ MULTIPLE NODE DISSECTION  . REDUCTION MAMMAPLASTY Left 1987  . TOTAL KNEE ARTHROPLASTY Left 07/21/2013   Procedure: TOTAL KNEE ARTHROPLASTY;  Surgeon: Newt Minion, MD;  Location: [place];  Service: Orthopedics;  Laterality: Left;  Left Total Knee Arthroplasty  . TOTAL KNEE ARTHROPLASTY Right 02/19/2017  . TOTAL KNEE ARTHROPLASTY Right 02/19/2017   Procedure: RIGHT TOTAL KNEE ARTHROPLASTY;  Surgeon: Newt Minion, MD;  Location: [place];  Service: Orthopedics;  Laterality: Right;  Marland Kitchen VAGINAL HYSTERECTOMY  1966    There were no vitals filed for this visit.      Subjective Assessment - 03/25/17 1352    Subjective alittle sore, may be overdoing it with HEP   Currently in Pain? Yes   Pain Score 1    Pain Location Knee   Pain Orientation Right   Pain Descriptors / Indicators Tightness            [facility] PT Assessment - 03/25/17 0001      AROM   Overall AROM Comments -2 ext after MT  [facility] Adult PT Treatment/Exercise - 03/25/17 0001      Knee/Hip Exercises: Aerobic   Elliptical 3 fwd/3 back I 10 R 4   Tread Mill push/pull 20 each for increased ext     Knee/Hip Exercises: Machines for Strengthening   Cybex Knee Extension 5# 3 sets 10  RT only   Cybex Knee Flexion 20# 2 sets 15   Cybex Leg Press 40# 3 sets 10  calf raises 40# 2 sets 10     Knee/Hip Exercises: Standing   Functional Squat 15 reps  on BOSU   Functional Squat Limitations fwd lunge and side squat onto Bosu     Knee/Hip Exercises: Seated   Other Seated Knee/Hip Exercises TKE blue tband 2 sets 10     Vasopneumatic   Number Minutes Vasopneumatic  15 minutes   Vasopnuematic Location  Knee   Vasopneumatic Pressure Medium     Manual Therapy   Manual  Therapy Joint mobilization   Joint Mobilization belt mobs to increase ext                PT Education - 03/25/17 1439    Education provided Yes   Education Details discussed our gym program and [facility] program   Person(s) Educated Patient   Methods Explanation   Comprehension Verbalized understanding          PT Short Term Goals - 03/14/17 1009      PT SHORT TERM GOAL #1   Title Pt will be ind with initial HEP           PT Long Term Goals - 03/21/17 1010      PT LONG TERM GOAL #1   Title pt will improve R knee AROM supine to 0-116 to match the opposite surgical LE   Baseline 5-120   Status Partially Met     PT LONG TERM GOAL #2   Title pt will ambulate to the end of her street walking her dog without limitations   Status Partially Met     PT LONG TERM GOAL #3   Title pt will improve LE strength to 4+/5 at the knee   Status Partially Met               Plan - 03/25/17 1439    Clinical Impression Statement pt making excellent progress with all goasl and ROM and strength, still slight quad lag but improving   PT Next Visit Plan TKE. 2 times next week then decrease to 1 time week      Patient will benefit from skilled therapeutic intervention in order to improve the following deficits and impairments:  Abnormal gait, Decreased range of motion, Increased edema, Decreased strength, Pain  Visit Diagnosis: Acute pain of right knee  Stiffness of right knee, not elsewhere classified  Localized edema     Problem List Patient Active Problem List   Diagnosis Date Noted  . Total knee replacement status, right 02/19/2017  . Bilateral otitis media with effusion 01/23/2017  . Preoperative cardiovascular examination 12/24/2016  . Cough 11/06/2016  . Hyponatremia 11/06/2016  . Diabetes ([place]) 11/06/2016  . Dysuria 04/30/2016  . Plantar fasciitis of right foot 12/28/2014  . Metatarsal deformity 12/28/2014  . Unspecified deficiency anemia  06/09/2013  . Anemia, unspecified 05/06/2013  . Lower back pain 02/01/2013  . Osteoarthritis of left knee 09/08/2012  . Complex tear of medial meniscus of left knee as current injury 09/08/2012  . Lower abdominal pain 07/23/2012  . Encounter for  well adult exam with abnormal findings 05/05/2012  . Family hx of colon cancer 05/05/2012  . Unilateral primary osteoarthritis, right knee 02/06/2011  . History of breast cancer 02/06/2011  . Palpitations   . Supraventricular tachycardia ([place])   . EUSTACHIAN TUBE DYSFUNCTION, LEFT 04/11/2010  . Anxiety state 05/05/2008  . Essential hypertension 05/05/2008  . Allergic rhinitis 05/05/2008  . IBS 05/05/2008  . OSTEOPENIA 05/05/2008  . COLONIC POLYPS, HX OF 05/05/2008  . Hyperlipidemia 05/29/2007  . GERD 05/29/2007    PAYSEUR,ANGIE PTA 03/25/2017, 2:41 PM  [place] [address] [place], [place], [address] Phone: [phone]   Fax:  [phone]  Name: ERIYAH FERNANDO MRN: [id number] Date of Birth: [birth year]

## 2017-03-27 ENCOUNTER — Encounter: Payer: Self-pay | Admitting: Physical Therapy

## 2017-03-27 ENCOUNTER — Ambulatory Visit: Payer: Medicare Other | Admitting: Physical Therapy

## 2017-03-27 DIAGNOSIS — M25561 Pain in right knee: Secondary | ICD-10-CM

## 2017-03-27 DIAGNOSIS — R600 Localized edema: Secondary | ICD-10-CM | POA: Diagnosis not present

## 2017-03-27 DIAGNOSIS — R262 Difficulty in walking, not elsewhere classified: Secondary | ICD-10-CM

## 2017-03-27 DIAGNOSIS — M25661 Stiffness of right knee, not elsewhere classified: Secondary | ICD-10-CM | POA: Diagnosis not present

## 2017-03-27 NOTE — Therapy (Signed)
[place], [place], [address] Phone: [phone]   Fax:  [phone]  Physical Therapy Treatment  Patient Details  Name: Leslie Bryant MRN: [id number] Date of Birth: 04/02/1941 Referring Provider: Meridee Score MD  Encounter Date: 03/27/2017      PT End of Session - 03/27/17 1007    Visit Number 6   Date for PT Re-Evaluation 04/23/17   PT Start Time 0927   PT Stop Time 1030   PT Time Calculation (min) 63 min      Past Medical History:  Diagnosis Date  . Acute meniscal tear of knee LEFT KNEE  . Arthritis    "knees" (02/19/2017)  . Breast cancer, right breast ([place]) 1986  . 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 Diverticulitis of large intestine with perforation 11/02/2011   Microperforation probably related to nut and popcorn consumption.  Resolved by CT scan Recurrent episode clinical dx 07/2012   . Diverticulosis of colon    sigmoid  . GERD (gastroesophageal reflux disease)   . H/O hiatal hernia   . History of kidney stones    "passed it" (02/19/2017)  . Hypercholesterolemia    history  . Left knee pain    occasionally  . Palpitations IRREGULAR HEARTBEAT--  CONTROLLED W/  BETA BLOCKER  . Pneumonia 2017   "walking pneumonia" (02/19/2017)  . Pre-diabetes    pcp is monitoring a1c currently 02/10/17  . Swelling of left knee joint     Past Surgical History:  Procedure Laterality Date  . BREAST BIOPSY Right 1986  . BREAST RECONSTRUCTION Right 1987   W/ IMPLANTS  . CARDIAC CATHETERIZATION  12-22-2000  DR BRACKBILL   NORMAL LVF/ NORMAL CORONARY ARTERIES  . CHOLECYSTECTOMY OPEN  1988  . CHONDROPLASTY  09/08/2012   Procedure: CHONDROPLASTY;  Surgeon: Tobi Bastos, MD;  [facility];  Service: Orthopedics;  Laterality: Left;  . COLONOSCOPY     last colon 05-03-2009 (02/19/2017)  . COLONOSCOPY W/ BIOPSIES AND POLYPECTOMY    . KNEE ARTHROSCOPY WITH MEDIAL MENISECTOMY   09/08/2012   Procedure: KNEE ARTHROSCOPY WITH MEDIAL MENISECTOMY;  Surgeon: Tobi Bastos, MD;  Location: [place];  Service: Orthopedics;  Laterality: Left;  lateral tibial plateau,   . MASTECTOMY Right 1986   W/ MULTIPLE NODE DISSECTION  . REDUCTION MAMMAPLASTY Left 1987  . TOTAL KNEE ARTHROPLASTY Left 07/21/2013   Procedure: TOTAL KNEE ARTHROPLASTY;  Surgeon: Newt Minion, MD;  Location: [place];  Service: Orthopedics;  Laterality: Left;  Left Total Knee Arthroplasty  . TOTAL KNEE ARTHROPLASTY Right 02/19/2017  . TOTAL KNEE ARTHROPLASTY Right 02/19/2017   Procedure: RIGHT TOTAL KNEE ARTHROPLASTY;  Surgeon: Newt Minion, MD;  Location: [facility];  Service: Orthopedics;  Laterality: Right;  Marland Kitchen VAGINAL HYSTERECTOMY  1966    There were no vitals filed for this visit.      Subjective Assessment - 03/27/17 0928    Subjective no issue- been doing well. decreased reps on HEP and less sore   Currently in Pain? No/denies            [facility] PT Assessment - 03/27/17 0001      AROM   Overall AROM Comments AROM at [facility] of session seated   1-125                     [facility] Adult PT Treatment/Exercise - 03/27/17 0001  Knee/Hip Exercises: Aerobic   Elliptical 3 fwd/3 back I 10 R 4   Nustep L 6 6 min LE only     Knee/Hip Exercises: Machines for Strengthening   Cybex Knee Extension 5# 3 sets 10  Rt only   Cybex Knee Flexion 20# 2 sets 15   Cybex Leg Press 40# 2 sets 15  calf raises 2 sets 15     Knee/Hip Exercises: Standing   Walking with Sports Cord 5 times 4 directions     Knee/Hip Exercises: Seated   Sit to Sand 20 reps;without UE support  wt ball on airex     Vasopneumatic   Number Minutes Vasopneumatic  15 minutes   Vasopnuematic Location  Knee   Vasopneumatic Pressure Medium                  PT Short Term Goals - 03/14/17 1009      PT SHORT TERM GOAL #1   Title Pt will be ind with initial HEP           PT Long Term Goals  - 03/27/17 1007      PT LONG TERM GOAL #1   Title pt will improve R knee AROM supine to 0-116 to match the opposite surgical LE   Status Partially Met     PT LONG TERM GOAL #2   Title pt will ambulate to the end of her street walking her dog without limitations   Status Achieved     PT LONG TERM GOAL #3   Title pt will improve LE strength to 4+/5 at the knee   Baseline quad 4/5, HS 4+/5   Status Partially Met             Patient will benefit from skilled therapeutic intervention in order to improve the following deficits and impairments:     Visit Diagnosis: Acute pain of right knee  Stiffness of right knee, not elsewhere classified  Localized edema  Difficulty in walking, not elsewhere classified     Problem List Patient Active Problem List   Diagnosis Date Noted  . Total knee replacement status, right 02/19/2017  . Bilateral otitis media with effusion 01/23/2017  . Preoperative cardiovascular examination 12/24/2016  . Cough 11/06/2016  . Hyponatremia 11/06/2016  . Diabetes ([place]) 11/06/2016  . Dysuria 04/30/2016  . Plantar fasciitis of right foot 12/28/2014  . Metatarsal deformity 12/28/2014  . Unspecified deficiency anemia 06/09/2013  . Anemia, unspecified 05/06/2013  . Lower back pain 02/01/2013  . Osteoarthritis of left knee 09/08/2012  . Complex tear of medial meniscus of left knee as current injury 09/08/2012  . Lower abdominal pain 07/23/2012  . Encounter for well adult exam with abnormal findings 05/05/2012  . Family hx of colon cancer 05/05/2012  . Unilateral primary osteoarthritis, right knee 02/06/2011  . History of breast cancer 02/06/2011  . Palpitations   . Supraventricular tachycardia ([place])   . EUSTACHIAN TUBE DYSFUNCTION, LEFT 04/11/2010  . Anxiety state 05/05/2008  . Essential hypertension 05/05/2008  . Allergic rhinitis 05/05/2008  . IBS 05/05/2008  . OSTEOPENIA 05/05/2008  . COLONIC POLYPS, HX OF 05/05/2008  . Hyperlipidemia  05/29/2007  . GERD 05/29/2007    Suzetta Timko,ANGIE PTA 03/27/2017, 10:09 AM  [place] [address] [place], [place], [address] Phone: [phone]   Fax:  [phone]  Name: BLAKLEY MICHNA MRN: [id number] Date of Birth: [birth year]

## 2017-04-01 ENCOUNTER — Ambulatory Visit: Payer: Medicare Other | Admitting: Physical Therapy

## 2017-04-01 ENCOUNTER — Encounter: Payer: Self-pay | Admitting: Physical Therapy

## 2017-04-01 DIAGNOSIS — M25561 Pain in right knee: Secondary | ICD-10-CM | POA: Diagnosis not present

## 2017-04-01 DIAGNOSIS — M25661 Stiffness of right knee, not elsewhere classified: Secondary | ICD-10-CM | POA: Diagnosis not present

## 2017-04-01 DIAGNOSIS — R262 Difficulty in walking, not elsewhere classified: Secondary | ICD-10-CM | POA: Diagnosis not present

## 2017-04-01 DIAGNOSIS — R600 Localized edema: Secondary | ICD-10-CM

## 2017-04-01 NOTE — Therapy (Signed)
[place], [place], [address] Phone: [phone]   Fax:  [phone]  Physical Therapy Treatment  Patient Details  Name: Leslie Bryant MRN: [id number] Date of Birth: 03/06/1941 Referring Provider: Meridee Score MD  Encounter Date: 04/01/2017      PT End of Session - 04/01/17 1207    Visit Number 7   Date for PT Re-Evaluation 04/23/17   PT Start Time 8588   PT Stop Time 1240   PT Time Calculation (min) 55 min      Past Medical History:  Diagnosis Date  . Acute meniscal tear of knee LEFT KNEE  . Arthritis    "knees" (02/19/2017)  . Breast cancer, right breast ([place]) 1986  . 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 Diverticulitis of large intestine with perforation 11/02/2011   Microperforation probably related to nut and popcorn consumption.  Resolved by CT scan Recurrent episode clinical dx 07/2012   . Diverticulosis of colon    sigmoid  . GERD (gastroesophageal reflux disease)   . H/O hiatal hernia   . History of kidney stones    "passed it" (02/19/2017)  . Hypercholesterolemia    history  . Left knee pain    occasionally  . Palpitations IRREGULAR HEARTBEAT--  CONTROLLED W/  BETA BLOCKER  . Pneumonia 2017   "walking pneumonia" (02/19/2017)  . Pre-diabetes    pcp is monitoring a1c currently 02/10/17  . Swelling of left knee joint     Past Surgical History:  Procedure Laterality Date  . BREAST BIOPSY Right 1986  . BREAST RECONSTRUCTION Right 1987   W/ IMPLANTS  . CARDIAC CATHETERIZATION  12-22-2000  DR BRACKBILL   NORMAL LVF/ NORMAL CORONARY ARTERIES  . CHOLECYSTECTOMY OPEN  1988  . CHONDROPLASTY  09/08/2012   Procedure: CHONDROPLASTY;  Surgeon: Tobi Bastos, MD;  Location: [facility];  Service: Orthopedics;  Laterality: Left;  . COLONOSCOPY     last colon 05-03-2009 (02/19/2017)  . COLONOSCOPY W/ BIOPSIES AND POLYPECTOMY    . KNEE ARTHROSCOPY WITH MEDIAL MENISECTOMY   09/08/2012   Procedure: KNEE ARTHROSCOPY WITH MEDIAL MENISECTOMY;  Surgeon: Tobi Bastos, MD;  Location: [facility];  Service: Orthopedics;  Laterality: Left;  lateral tibial plateau,   . MASTECTOMY Right 1986   W/ MULTIPLE NODE DISSECTION  . REDUCTION MAMMAPLASTY Left 1987  . TOTAL KNEE ARTHROPLASTY Left 07/21/2013   Procedure: TOTAL KNEE ARTHROPLASTY;  Surgeon: Newt Minion, MD;  Location: [facility];  Service: Orthopedics;  Laterality: Left;  Left Total Knee Arthroplasty  . TOTAL KNEE ARTHROPLASTY Right 02/19/2017  . TOTAL KNEE ARTHROPLASTY Right 02/19/2017   Procedure: RIGHT TOTAL KNEE ARTHROPLASTY;  Surgeon: Newt Minion, MD;  Location: [facility];  Service: Orthopedics;  Laterality: Right;  Marland Kitchen VAGINAL HYSTERECTOMY  1966    There were no vitals filed for this visit.      Subjective Assessment - 04/01/17 1145    Subjective achey with rain   Currently in Pain? Yes   Pain Score 2    Pain Location Knee   Pain Orientation Right                         [facility] Adult PT Treatment/Exercise - 04/01/17 0001      Knee/Hip Exercises: Aerobic   Elliptical 3 fwd/3 back I 10 R 4   Nustep L 6 6 min LE  only     Knee/Hip Exercises: Machines for Strengthening   Cybex Knee Extension 5# 3 sets 10   Cybex Knee Flexion 20# 2 sets 15   Cybex Leg Press 40# 2 sets 15  calf raises 2 sets 15     Knee/Hip Exercises: Standing   Forward Step Up Right;15 reps;Hand Hold: 2  BOSU   Other Standing Knee Exercises side step with squat 20 feet 2 times  red tband   Other Standing Knee Exercises BOSU squat 20     Vasopneumatic   Number Minutes Vasopneumatic  15 minutes   Vasopnuematic Location  Knee   Vasopneumatic Pressure Medium                  PT Short Term Goals - 03/14/17 1009      PT SHORT TERM GOAL #1   Title Pt will be ind with initial HEP           PT Long Term Goals - 03/27/17 1007      PT LONG TERM GOAL #1   Title pt will improve R knee AROM  supine to 0-116 to match the opposite surgical LE   Status Partially Met     PT LONG TERM GOAL #2   Title pt will ambulate to the end of her street walking her dog without limitations   Status Achieved     PT LONG TERM GOAL #3   Title pt will improve LE strength to 4+/5 at the knee   Baseline quad 4/5, HS 4+/5   Status Partially Met               Plan - 04/01/17 1207    Clinical Impression Statement progressing with all goals and issued ex sheet to transition to gym program   PT Next Visit Plan transition to gym and look to D/C next week      Patient will benefit from skilled therapeutic intervention in order to improve the following deficits and impairments:     Visit Diagnosis: Acute pain of right knee  Stiffness of right knee, not elsewhere classified  Localized edema  Difficulty in walking, not elsewhere classified     Problem List Patient Active Problem List   Diagnosis Date Noted  . Total knee replacement status, right 02/19/2017  . Bilateral otitis media with effusion 01/23/2017  . Preoperative cardiovascular examination 12/24/2016  . Cough 11/06/2016  . Hyponatremia 11/06/2016  . Diabetes ([name]) 11/06/2016  . Dysuria 04/30/2016  . Plantar fasciitis of right foot 12/28/2014  . Metatarsal deformity 12/28/2014  . Unspecified deficiency anemia 06/09/2013  . Anemia, unspecified 05/06/2013  . Lower back pain 02/01/2013  . Osteoarthritis of left knee 09/08/2012  . Complex tear of medial meniscus of left knee as current injury 09/08/2012  . Lower abdominal pain 07/23/2012  . Encounter for well adult exam with abnormal findings 05/05/2012  . Family hx of colon cancer 05/05/2012  . Unilateral primary osteoarthritis, right knee 02/06/2011  . History of breast cancer 02/06/2011  . Palpitations   . Supraventricular tachycardia ([place])   . EUSTACHIAN TUBE DYSFUNCTION, LEFT 04/11/2010  . Anxiety state 05/05/2008  . Essential hypertension 05/05/2008  .  Allergic rhinitis 05/05/2008  . IBS 05/05/2008  . OSTEOPENIA 05/05/2008  . COLONIC POLYPS, HX OF 05/05/2008  . Hyperlipidemia 05/29/2007  . GERD 05/29/2007    Jodene Polyak,ANGIE PTA 04/01/2017, 12:20 PM  [place], [place], [address] Phone:  [phone]   Fax:  [phone]  Name: Leslie Bryant MRN: [id number] Date of Birth: 1941/05/14

## 2017-04-03 ENCOUNTER — Ambulatory Visit: Payer: Medicare Other | Admitting: Internal Medicine

## 2017-04-03 ENCOUNTER — Telehealth: Payer: Self-pay

## 2017-04-03 ENCOUNTER — Encounter: Payer: Self-pay | Admitting: Internal Medicine

## 2017-04-03 ENCOUNTER — Other Ambulatory Visit: Payer: Medicare Other

## 2017-04-03 ENCOUNTER — Ambulatory Visit (INDEPENDENT_AMBULATORY_CARE_PROVIDER_SITE_OTHER): Payer: Medicare Other | Admitting: Internal Medicine

## 2017-04-03 ENCOUNTER — Other Ambulatory Visit (INDEPENDENT_AMBULATORY_CARE_PROVIDER_SITE_OTHER): Payer: Medicare Other

## 2017-04-03 VITALS — BP 118/78 | HR 91 | Ht 62.0 in | Wt 150.0 lb

## 2017-04-03 DIAGNOSIS — R1030 Lower abdominal pain, unspecified: Secondary | ICD-10-CM

## 2017-04-03 DIAGNOSIS — R634 Abnormal weight loss: Secondary | ICD-10-CM

## 2017-04-03 DIAGNOSIS — R197 Diarrhea, unspecified: Secondary | ICD-10-CM

## 2017-04-03 DIAGNOSIS — R1032 Left lower quadrant pain: Secondary | ICD-10-CM | POA: Insufficient documentation

## 2017-04-03 LAB — URINALYSIS, ROUTINE W REFLEX MICROSCOPIC
BILIRUBIN URINE: NEGATIVE
HGB URINE DIPSTICK: NEGATIVE
KETONES UR: NEGATIVE
Nitrite: NEGATIVE
RBC / HPF: NONE SEEN (ref 0–?)
Specific Gravity, Urine: 1.02 (ref 1.000–1.030)
Total Protein, Urine: NEGATIVE
Urine Glucose: NEGATIVE
Urobilinogen, UA: 0.2 (ref 0.0–1.0)
pH: 6 (ref 5.0–8.0)

## 2017-04-03 LAB — CBC WITH DIFFERENTIAL/PLATELET
BASOS ABS: 0 10*3/uL (ref 0.0–0.1)
BASOS PCT: 0.7 % (ref 0.0–3.0)
EOS ABS: 0.1 10*3/uL (ref 0.0–0.7)
Eosinophils Relative: 1.6 % (ref 0.0–5.0)
HCT: 35.7 % — ABNORMAL LOW (ref 36.0–46.0)
HEMOGLOBIN: 12 g/dL (ref 12.0–15.0)
LYMPHS PCT: 28.7 % (ref 12.0–46.0)
Lymphs Abs: 2 10*3/uL (ref 0.7–4.0)
MCHC: 33.6 g/dL (ref 30.0–36.0)
MCV: 92.1 fl (ref 78.0–100.0)
MONOS PCT: 9.3 % (ref 3.0–12.0)
Monocytes Absolute: 0.7 10*3/uL (ref 0.1–1.0)
NEUTROS ABS: 4.2 10*3/uL (ref 1.4–7.7)
Neutrophils Relative %: 59.7 % (ref 43.0–77.0)
Platelets: 373 10*3/uL (ref 150.0–400.0)
RBC: 3.88 Mil/uL (ref 3.87–5.11)
RDW: 12.7 % (ref 11.5–15.5)
WBC: 7.1 10*3/uL (ref 4.0–10.5)

## 2017-04-03 LAB — LIPASE: LIPASE: 33 U/L (ref 11.0–59.0)

## 2017-04-03 LAB — HEPATIC FUNCTION PANEL
ALT: 34 U/L (ref 0–35)
AST: 23 U/L (ref 0–37)
Albumin: 4.5 g/dL (ref 3.5–5.2)
Alkaline Phosphatase: 77 U/L (ref 39–117)
Bilirubin, Direct: 0 mg/dL (ref 0.0–0.3)
Total Bilirubin: 0.3 mg/dL (ref 0.2–1.2)
Total Protein: 8 g/dL (ref 6.0–8.3)

## 2017-04-03 LAB — BASIC METABOLIC PANEL
BUN: 17 mg/dL (ref 6–23)
CALCIUM: 10.1 mg/dL (ref 8.4–10.5)
CO2: 27 meq/L (ref 19–32)
Chloride: 99 mEq/L (ref 96–112)
Creatinine, Ser: 0.81 mg/dL (ref 0.40–1.20)
GFR: 72.92 mL/min (ref >60.00)
GLUCOSE: 114 mg/dL — AB (ref 70–99)
Potassium: 4.3 mEq/L (ref 3.5–5.1)
SODIUM: 134 meq/L — AB (ref 135–145)

## 2017-04-03 NOTE — Telephone Encounter (Signed)
Called pt, LVM.   

## 2017-04-03 NOTE — Assessment & Plan Note (Signed)
Quite concerning for large wt loss in last 2 wks, hopefully more due to intentional calorie control than illness, cont to monitor closely

## 2017-04-03 NOTE — Patient Instructions (Signed)

## 2017-04-03 NOTE — Assessment & Plan Note (Signed)
Ok for stool testing with cx, biofire pathogen panel, also r/o c diff

## 2017-04-03 NOTE — Assessment & Plan Note (Signed)
eitology unclear, suspect some kind of colitiis in light diarrhea and wt loss; for labs as documented, hold on imaging for now but to consider if worsens

## 2017-04-03 NOTE — Telephone Encounter (Signed)
-----   Message from Biagio Borg, MD sent at 04/03/2017 11:54 AM EDT ----- Ok to call pt - labs are essentially normal; I will send letter as well, but stool tests are still pending

## 2017-04-03 NOTE — Progress Notes (Signed)
Subjective:    Patient ID: Leslie Bryant, female    DOB: Dec 21, 1940, 76 y.o.   MRN: [id number]  HPI  Here to f/u s/p TKA April 2353 uncomplicated with minor anemia ABL post op. Feels fatigued but overall has had increasing activity, but in the past wk has had worsening lower crampy abd pain, n/v, and diarrhea described as loose stools with "stringy looking stuff" ; no blood, not sure about fever and has felt warm at times but lost her thermometer so not checked.  No chills.  No radiation to the back.  No hx of C diff or overt sick contacts.  Lost 13 lbs and reduced appetite, and somewhat intentional calorie reduction mostly carbs Wt Readings from Last 3 Encounters:  04/03/17 150 lb (68 kg)  03/19/17 163 lb (73.9 kg)  03/05/17 163 lb (73.9 kg)  Did have levaquin course in mar for ear infectoin.  Did have visit with FP in this office with flagyl course given for possible diverticulitis, as she had hx of complicated diverticulitis complicated by abscess requiring drain.  Denies urinary symptoms such as dysuria, frequency, urgency, flank pain, hematuria or n/v, fever, chills.  Denies worsening reflux, dysphagia. Past Medical History:  Diagnosis Date  . Acute meniscal tear of knee LEFT KNEE  . Arthritis    "knees" (02/19/2017)  . Breast cancer, right breast ([place]) 1986  . 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 Diverticulitis of large intestine with perforation 11/02/2011   Microperforation probably related to nut and popcorn consumption.  Resolved by CT scan Recurrent episode clinical dx 07/2012   . Diverticulosis of colon    sigmoid  . GERD (gastroesophageal reflux disease)   . H/O hiatal hernia   . History of kidney stones    "passed it" (02/19/2017)  . Hypercholesterolemia    history  . Left knee pain    occasionally  . Palpitations IRREGULAR HEARTBEAT--  CONTROLLED W/  BETA BLOCKER  . Pneumonia 2017   "walking pneumonia" (02/19/2017)  . Pre-diabetes    pcp is monitoring a1c currently 02/10/17    . Swelling of left knee joint    Past Surgical History:  Procedure Laterality Date  . BREAST BIOPSY Right 1986  . BREAST RECONSTRUCTION Right 1987   W/ IMPLANTS  . CARDIAC CATHETERIZATION  12-22-2000  DR BRACKBILL   NORMAL LVF/ NORMAL CORONARY ARTERIES  . CHOLECYSTECTOMY OPEN  1988  . CHONDROPLASTY  09/08/2012   Procedure: CHONDROPLASTY;  Surgeon: Tobi Bastos, MD;  Location: [facility];  Service: Orthopedics;  Laterality: Left;  . COLONOSCOPY     last colon 05-03-2009 (02/19/2017)  . COLONOSCOPY W/ BIOPSIES AND POLYPECTOMY    . KNEE ARTHROSCOPY WITH MEDIAL MENISECTOMY  09/08/2012   Procedure: KNEE ARTHROSCOPY WITH MEDIAL MENISECTOMY;  Surgeon: Tobi Bastos, MD;  Location: [facility];  Service: Orthopedics;  Laterality: Left;  lateral tibial plateau,   . MASTECTOMY Right 1986   W/ MULTIPLE NODE DISSECTION  . REDUCTION MAMMAPLASTY Left 1987  . TOTAL KNEE ARTHROPLASTY Left 07/21/2013   Procedure: TOTAL KNEE ARTHROPLASTY;  Surgeon: Newt Minion, MD;  Location: [facility];  Service: Orthopedics;  Laterality: Left;  Left Total Knee Arthroplasty  . TOTAL KNEE ARTHROPLASTY Right 02/19/2017  . TOTAL KNEE ARTHROPLASTY Right 02/19/2017   Procedure: RIGHT TOTAL KNEE ARTHROPLASTY;  Surgeon: Newt Minion, MD;  Location: [facility];  Service: Orthopedics;  Laterality: Right;  Marland Kitchen VAGINAL HYSTERECTOMY  1966  reports that she has never smoked. She has never used smokeless tobacco. She reports that she drinks about 0.6 oz of alcohol per week . She reports that she does not use drugs. family history includes Brain cancer in her sister; Cancer in her brother; Colon cancer in her brother; Heart attack in her father; Heart failure in her sister; Leukemia in her brother; Lung cancer in her sister. Allergies  Allergen Reactions  . Crestor [Rosuvastatin Calcium] Other (See Comments)    Shoulder pain  . Lipitor [Atorvastatin Calcium] Other (See Comments)    Myalgias   .  Pravastatin Other (See Comments)    myalgias  . Penicillins Itching    Has patient had a PCN reaction causing immediate rash, facial/tongue/throat swelling, SOB or lightheadedness with hypotension: No Has patient had a PCN reaction causing severe rash involving mucus membranes or skin necrosis: No Has patient had a PCN reaction that required hospitalization No Has patient had a PCN reaction occurring within the last 10 years: No If all of the above answers are "NO", then may proceed with Cephalosporin use.    Current Outpatient Prescriptions on File Prior to Visit  Medication Sig Dispense Refill  . aspirin 81 MG tablet Take 81 mg by mouth 2 (two) times daily.    Marland Kitchen BIOTIN 5000 PO Take 5,000 mcg by mouth daily.     Marland Kitchen co-enzyme Q-10 30 MG capsule Take 30 mg by mouth 2 (two) times daily.     Marland Kitchen ezetimibe (ZETIA) 10 MG tablet Take 1 tablet (10 mg total) by mouth daily. 30 tablet 8  . fish oil-omega-3 fatty acids 1000 MG capsule Take 1 g by mouth daily.     Marland Kitchen ibuprofen (ADVIL,MOTRIN) 200 MG tablet Take 400 mg by mouth 2 (two) times daily as needed for moderate pain.    . metFORMIN (GLUCOPHAGE) 500 MG tablet Take 1 tablet (500 mg total) by mouth daily with breakfast. 90 tablet 3  . methocarbamol (ROBAXIN) 500 MG tablet TAKE 1 TABLET BY MOUTH 3 TIMES DAILY 30 tablet 0  . metoprolol (LOPRESSOR) 50 MG tablet TAKE 1 TABLET BY MOUTH TWICE DAILY 180 tablet 3  . Multiple Vitamins-Minerals (CENTRUM SILVER PO) Take 1 tablet by mouth every morning.     Marland Kitchen omeprazole (PRILOSEC) 40 MG capsule Take 1 capsule (40 mg total) by mouth daily. 90 capsule 0  . oxyCODONE-acetaminophen (ROXICET) 5-325 MG tablet Take 1 tablet by mouth every 8 (eight) hours as needed for severe pain. 60 tablet 0  . Probiotic Product (ALIGN) 4 MG CAPS Take 4 mg by mouth daily.     No current facility-administered medications on file prior to visit.    Review of Systems  Constitutional: Negative for other unusual diaphoresis or  sweats HENT: Negative for ear discharge or swelling Eyes: Negative for other worsening visual disturbances Respiratory: Negative for stridor or other swelling  Gastrointestinal: Negative for worsening distension or other blood Genitourinary: Negative for retention or other urinary change Musculoskeletal: Negative for other MSK pain or swelling Skin: Negative for color change or other new lesions Neurological: Negative for worsening tremors and other numbness  Psychiatric/Behavioral: Negative for worsening agitation or other fatigue All other system neg per pt    Objective:   Physical Exam BP 118/78   Pulse 91   Ht 5\' 2"  (1.575 m)   Wt 150 lb (68 kg)   SpO2 98%   BMI 27.44 kg/m  VS noted, non toxic, somewhat fatiuged, right knee is stiff postop but  stands slowly and able to get up on exam table without assist, no need for assistivei device Constitutional: Pt appears in NAD HENT: Head: NCAT.  Right Ear: External ear normal. Right TM clear Left Ear: External ear normal. Left TM with mild erythema, post fluid Eyes: . Pupils are equal, round, and reactive to light. Conjunctivae and EOM are normal Nose: without d/c or deformity Neck: Neck supple. Gross normal ROM Cardiovascular: Normal rate and regular rhythm.   Pulmonary/Chest: Effort normal and breath sounds without rales or wheezing.  Abd:  Soft, ND, + BS, no organomegaly, with very mild "pressure" like discomfort only to the Left > right lower quads, no guarding or rebound Neurological: Pt is alert. At baseline orientation, motor grossly intact Skin: Skin is warm. No rashes, other new lesions, no LE edema Psychiatric: Pt behavior is normal without agitation  No other exam findings Lab Results  Component Value Date   WBC 7.0 02/10/2017   HGB 11.6 (L) 02/10/2017   HCT 34.9 (L) 02/10/2017   PLT 317 02/10/2017   GLUCOSE 113 (H) 02/10/2017   CHOL 150 11/06/2016   TRIG 171.0 (H) 11/06/2016   HDL 42.10 11/06/2016   LDLDIRECT  111.9 07/13/2014   LDLCALC 74 11/06/2016   ALT 39 (H) 11/06/2016   AST 25 11/06/2016   NA 135 02/10/2017   K 4.0 02/10/2017   CL 103 02/10/2017   CREATININE 0.69 02/10/2017   BUN 8 02/10/2017   CO2 24 02/10/2017   TSH 1.08 11/06/2016   INR 1.01 07/13/2013   HGBA1C 6.2 (H) 02/10/2017         Assessment & Plan:

## 2017-04-04 ENCOUNTER — Encounter: Payer: Self-pay | Admitting: Internal Medicine

## 2017-04-04 ENCOUNTER — Telehealth: Payer: Self-pay

## 2017-04-04 LAB — CLOSTRIDIUM DIFFICILE BY PCR: Toxigenic C. Difficile by PCR: NOT DETECTED

## 2017-04-04 LAB — C. DIFFICILE GDH AND TOXIN A/B
C. DIFF TOXIN A/B: NOT DETECTED
C. difficile GDH: DETECTED — AB

## 2017-04-04 NOTE — Telephone Encounter (Signed)
-----   Message from Biagio Borg, MD sent at 04/04/2017  1:03 PM EDT ----- Letter sent, cont same tx except  The test results show that your current treatment is OK, as althought the C diff [facility] test was positive, the toxin was negative.  This means you have C Diff bacteria present, but there is no actual infection occurring.  Your other testing is still pending (stool culture)  Shirron to please inform pt, and we will try to at least send a letter if the other tests are negative

## 2017-04-04 NOTE — Progress Notes (Signed)
   Subjective:    Patient ID: Leslie Bryant, female    DOB: 04-30-1941, 76 y.o.   MRN: [id number]  HPI    Review of Systems     Objective:   Physical Exam        Assessment & Plan:

## 2017-04-04 NOTE — Addendum Note (Signed)
Addendum  created 04/04/17 1318 by Effie Berkshire, MD   Sign clinical note

## 2017-04-04 NOTE — Anesthesia Postprocedure Evaluation (Signed)
Anesthesia Post Note  Patient: Leslie Bryant  Procedure(s) Performed: Procedure(s) (LRB): RIGHT TOTAL KNEE ARTHROPLASTY (Right)     Anesthesia Post Evaluation  Last Vitals:  Vitals:   02/20/17 2121 02/21/17 0512  BP: 131/65 (!) 132/54  Pulse: (!) 107 98  Resp:    Temp: 36.7 C 37.3 C    Last Pain:  Vitals:   02/21/17 0941  TempSrc:   PainSc: [place] Joani Cosma

## 2017-04-04 NOTE — Telephone Encounter (Signed)
Pt was informed of results and expressed understanding. I told her that I would be back in touch when the stool culture comes in.

## 2017-04-07 LAB — GASTROINTESTINAL PATHOGEN PANEL PCR
C. difficile Tox A/B, PCR: NOT DETECTED
CAMPYLOBACTER, PCR: NOT DETECTED
Cryptosporidium, PCR: NOT DETECTED
E COLI (ETEC) LT/ST, PCR: NOT DETECTED
E COLI 0157, PCR: NOT DETECTED
E coli (STEC) stx1/stx2, PCR: NOT DETECTED
Giardia lamblia, PCR: NOT DETECTED
Norovirus, PCR: NOT DETECTED
Rotavirus A, PCR: NOT DETECTED
SALMONELLA, PCR: NOT DETECTED
SHIGELLA, PCR: NOT DETECTED

## 2017-04-07 LAB — STOOL CULTURE

## 2017-04-10 DIAGNOSIS — M546 Pain in thoracic spine: Secondary | ICD-10-CM | POA: Diagnosis not present

## 2017-04-10 DIAGNOSIS — M9902 Segmental and somatic dysfunction of thoracic region: Secondary | ICD-10-CM | POA: Diagnosis not present

## 2017-04-10 DIAGNOSIS — M9901 Segmental and somatic dysfunction of cervical region: Secondary | ICD-10-CM | POA: Diagnosis not present

## 2017-04-10 DIAGNOSIS — M5413 Radiculopathy, cervicothoracic region: Secondary | ICD-10-CM | POA: Diagnosis not present

## 2017-04-11 ENCOUNTER — Ambulatory Visit: Payer: Medicare Other | Attending: Orthopedic Surgery | Admitting: Physical Therapy

## 2017-04-11 ENCOUNTER — Encounter: Payer: Self-pay | Admitting: Physical Therapy

## 2017-04-11 DIAGNOSIS — M25561 Pain in right knee: Secondary | ICD-10-CM | POA: Insufficient documentation

## 2017-04-11 DIAGNOSIS — R600 Localized edema: Secondary | ICD-10-CM | POA: Diagnosis present

## 2017-04-11 DIAGNOSIS — M25661 Stiffness of right knee, not elsewhere classified: Secondary | ICD-10-CM | POA: Diagnosis present

## 2017-04-11 NOTE — Therapy (Signed)
[place], [place], [address] Phone: [phone]   Fax:  [phone]  Physical Therapy Treatment  Patient Details  Name: Leslie Bryant MRN: [id number] Date of Birth: Sep 08, 1941 Referring Provider: Meridee Score MD  Encounter Date: 04/11/2017      PT End of Session - 04/11/17 1021    Visit Number 8   Date for PT Re-Evaluation 04/23/17   PT Start Time 2671   PT Stop Time 1105   PT Time Calculation (min) 50 min      Past Medical History:  Diagnosis Date  . Acute meniscal tear of knee LEFT KNEE  . Arthritis    "knees" (02/19/2017)  . Breast cancer, right breast ([place]) 1986  . 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 Diverticulitis of large intestine with perforation 11/02/2011   Microperforation probably related to nut and popcorn consumption.  Resolved by CT scan Recurrent episode clinical dx 07/2012   . Diverticulosis of colon    sigmoid  . GERD (gastroesophageal reflux disease)   . H/O hiatal hernia   . History of kidney stones    "passed it" (02/19/2017)  . Hypercholesterolemia    history  . Left knee pain    occasionally  . Palpitations IRREGULAR HEARTBEAT--  CONTROLLED W/  BETA BLOCKER  . Pneumonia 2017   "walking pneumonia" (02/19/2017)  . Pre-diabetes    pcp is monitoring a1c currently 02/10/17  . Swelling of left knee joint     Past Surgical History:  Procedure Laterality Date  . BREAST BIOPSY Right 1986  . BREAST RECONSTRUCTION Right 1987   W/ IMPLANTS  . CARDIAC CATHETERIZATION  12-22-2000  DR BRACKBILL   NORMAL LVF/ NORMAL CORONARY ARTERIES  . CHOLECYSTECTOMY OPEN  1988  . CHONDROPLASTY  09/08/2012   Procedure: CHONDROPLASTY;  Surgeon: Tobi Bastos, MD;  Location: [facility];  Service: Orthopedics;  Laterality: Left;  . COLONOSCOPY     last colon 05-03-2009 (02/19/2017)  . COLONOSCOPY W/ BIOPSIES AND POLYPECTOMY    . KNEE ARTHROSCOPY WITH MEDIAL MENISECTOMY   09/08/2012   Procedure: KNEE ARTHROSCOPY WITH MEDIAL MENISECTOMY;  Surgeon: Tobi Bastos, MD;  Location: [facility];  Service: Orthopedics;  Laterality: Left;  lateral tibial plateau,   . MASTECTOMY Right 1986   W/ MULTIPLE NODE DISSECTION  . REDUCTION MAMMAPLASTY Left 1987  . TOTAL KNEE ARTHROPLASTY Left 07/21/2013   Procedure: TOTAL KNEE ARTHROPLASTY;  Surgeon: Newt Minion, MD;  Location: [facility];  Service: Orthopedics;  Laterality: Left;  Left Total Knee Arthroplasty  . TOTAL KNEE ARTHROPLASTY Right 02/19/2017  . TOTAL KNEE ARTHROPLASTY Right 02/19/2017   Procedure: RIGHT TOTAL KNEE ARTHROPLASTY;  Surgeon: Newt Minion, MD;  Location: [facility];  Service: Orthopedics;  Laterality: Right;  Marland Kitchen VAGINAL HYSTERECTOMY  1966    There were no vitals filed for this visit.      Subjective Assessment - 04/11/17 1018    Subjective ready to graduate   Currently in Pain? Yes   Pain Score 1    Pain Location Knee   Pain Orientation Right   Pain Descriptors / Indicators Aching            [facility] PT Assessment - 04/11/17 0001      AROM   Overall AROM Comments 0-130     Strength   Left Knee Flexion 5/5   Left Knee Extension 5/5  [facility] Adult PT Treatment/Exercise - 04/11/17 0001      Knee/Hip Exercises: Aerobic   Elliptical 3 fwd/3 back I 8 R 6   Tread Mill 6 min    Nustep L 6 6 min LE only     Knee/Hip Exercises: Machines for Strengthening   Cybex Knee Extension 5# 3 sets 10   Cybex Knee Flexion 25# 2 sets 15   Cybex Leg Press 50# 2 sets 15  calf raises 50# 2 sets 15   Other Machine 10# hip flex,knee ext pulley 2 sets 10     Knee/Hip Exercises: Standing   Functional Squat 20 reps  on BOSU                  PT Short Term Goals - 04/11/17 1022      PT SHORT TERM GOAL #1   Title Pt will be ind with initial HEP   Status Achieved           PT Long Term Goals - 04/11/17 1022      PT LONG TERM GOAL #1   Title pt  will improve R knee AROM supine to 0-116 to match the opposite surgical LE   Status Achieved     PT LONG TERM GOAL #2   Title pt will ambulate to the end of her street walking her dog without limitations   Status Achieved     PT LONG TERM GOAL #3   Title pt will improve LE strength to 4+/5 at the knee   Status Achieved               Plan - 04/11/17 1021    Clinical Impression Statement all goals met D/C to gym   PT Next Visit Plan D/C      Patient will benefit from skilled therapeutic intervention in order to improve the following deficits and impairments:     Visit Diagnosis: Acute pain of right knee  Stiffness of right knee, not elsewhere classified  Localized edema     Problem List Patient Active Problem List   Diagnosis Date Noted  . Abdominal pain 04/03/2017  . Diarrhea 04/03/2017  . Weight loss 04/03/2017  . Total knee replacement status, right 02/19/2017  . Bilateral otitis media with effusion 01/23/2017  . Preoperative cardiovascular examination 12/24/2016  . Cough 11/06/2016  . Hyponatremia 11/06/2016  . Diabetes ([place]) 11/06/2016  . Dysuria 04/30/2016  . Plantar fasciitis of right foot 12/28/2014  . Metatarsal deformity 12/28/2014  . Unspecified deficiency anemia 06/09/2013  . Anemia, unspecified 05/06/2013  . Lower back pain 02/01/2013  . Osteoarthritis of left knee 09/08/2012  . Complex tear of medial meniscus of left knee as current injury 09/08/2012  . Lower abdominal pain 07/23/2012  . Encounter for well adult exam with abnormal findings 05/05/2012  . Family hx of colon cancer 05/05/2012  . Unilateral primary osteoarthritis, right knee 02/06/2011  . History of breast cancer 02/06/2011  . Palpitations   . Supraventricular tachycardia ([place])   . EUSTACHIAN TUBE DYSFUNCTION, LEFT 04/11/2010  . Anxiety state 05/05/2008  . Essential hypertension 05/05/2008  . Allergic rhinitis 05/05/2008  . IBS 05/05/2008  . OSTEOPENIA 05/05/2008  .  COLONIC POLYPS, HX OF 05/05/2008  . Hyperlipidemia 05/29/2007  . GERD 05/29/2007   PHYSICAL THERAPY DISCHARGE SUMMARY   Plan: Patient agrees to discharge.  Patient goals were met. Patient is being discharged due to meeting the stated rehab goals.  ?????      Kestrel Mis,ANGIE PTA 04/11/2017,  10:53 AM  [place] [address] [place], [place], [address] Phone: [phone]   Fax:  [phone]  Name: Leslie Bryant MRN: [id number] Date of Birth: 1941/04/22

## 2017-04-15 DIAGNOSIS — M9902 Segmental and somatic dysfunction of thoracic region: Secondary | ICD-10-CM | POA: Diagnosis not present

## 2017-04-15 DIAGNOSIS — M9901 Segmental and somatic dysfunction of cervical region: Secondary | ICD-10-CM | POA: Diagnosis not present

## 2017-04-15 DIAGNOSIS — M5413 Radiculopathy, cervicothoracic region: Secondary | ICD-10-CM | POA: Diagnosis not present

## 2017-04-15 DIAGNOSIS — M546 Pain in thoracic spine: Secondary | ICD-10-CM | POA: Diagnosis not present

## 2017-04-16 ENCOUNTER — Other Ambulatory Visit (INDEPENDENT_AMBULATORY_CARE_PROVIDER_SITE_OTHER): Payer: Self-pay | Admitting: Family

## 2017-04-16 ENCOUNTER — Ambulatory Visit (INDEPENDENT_AMBULATORY_CARE_PROVIDER_SITE_OTHER): Payer: Self-pay

## 2017-04-16 ENCOUNTER — Ambulatory Visit (INDEPENDENT_AMBULATORY_CARE_PROVIDER_SITE_OTHER): Payer: Medicare Other | Admitting: Family

## 2017-04-16 ENCOUNTER — Encounter (INDEPENDENT_AMBULATORY_CARE_PROVIDER_SITE_OTHER): Payer: Self-pay | Admitting: Orthopedic Surgery

## 2017-04-16 ENCOUNTER — Ambulatory Visit (INDEPENDENT_AMBULATORY_CARE_PROVIDER_SITE_OTHER): Payer: Medicare Other | Admitting: Orthopedic Surgery

## 2017-04-16 VITALS — Ht 62.0 in | Wt 150.0 lb

## 2017-04-16 DIAGNOSIS — M25561 Pain in right knee: Secondary | ICD-10-CM

## 2017-04-16 DIAGNOSIS — Z96651 Presence of right artificial knee joint: Secondary | ICD-10-CM

## 2017-04-16 MED ORDER — OXYCODONE-ACETAMINOPHEN 5-325 MG PO TABS: 1.0000 | ORAL_TABLET | Freq: Two times a day (BID) | ORAL | 0 refills | Status: DC | PRN

## 2017-04-16 NOTE — Progress Notes (Unsigned)
Post-Op Visit Note   Patient: Leslie Bryant           Date of Birth: Apr 14, 1941           MRN: [id number] Visit Date: 04/16/2017 PCP: Biagio Borg, MD  Chief Complaint: No chief complaint on file.   HPI:  HPI  Ortho Exam ***  Visit Diagnoses: No diagnosis found.  Plan: ***  Follow-Up Instructions: No Follow-up on file.   Imaging: No results found.  Orders:  No orders of the defined types were placed in this encounter.  Meds ordered this encounter  Medications  . oxyCODONE-acetaminophen (ROXICET) 5-325 MG tablet    Sig: Take 1 tablet by mouth 2 (two) times daily as needed for severe pain.    Dispense:  60 tablet    Refill:  0     PMFS History: Patient Active Problem List   Diagnosis Date Noted  . Abdominal pain 04/03/2017  . Diarrhea 04/03/2017  . Weight loss 04/03/2017  . Total knee replacement status, right 02/19/2017  . Bilateral otitis media with effusion 01/23/2017  . Preoperative cardiovascular examination 12/24/2016  . Cough 11/06/2016  . Hyponatremia 11/06/2016  . Diabetes ([place]) 11/06/2016  . Dysuria 04/30/2016  . Plantar fasciitis of right foot 12/28/2014  . Metatarsal deformity 12/28/2014  . Unspecified deficiency anemia 06/09/2013  . Anemia, unspecified 05/06/2013  . Lower back pain 02/01/2013  . Osteoarthritis of left knee 09/08/2012  . Complex tear of medial meniscus of left knee as current injury 09/08/2012  . Lower abdominal pain 07/23/2012  . Encounter for well adult exam with abnormal findings 05/05/2012  . Family hx of colon cancer 05/05/2012  . Unilateral primary osteoarthritis, right knee 02/06/2011  . History of breast cancer 02/06/2011  . Palpitations   . Supraventricular tachycardia ([place])   . EUSTACHIAN TUBE DYSFUNCTION, LEFT 04/11/2010  . Anxiety state 05/05/2008  . Essential hypertension 05/05/2008  . Allergic rhinitis 05/05/2008  . IBS 05/05/2008  . OSTEOPENIA 05/05/2008  . COLONIC POLYPS, HX OF 05/05/2008  .  Hyperlipidemia 05/29/2007  . GERD 05/29/2007   Past Medical History:  Diagnosis Date  . Acute meniscal tear of knee LEFT KNEE  . Arthritis    "knees" (02/19/2017)  . Breast cancer, right breast ([place]) 1986  . 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 Diverticulitis of large intestine with perforation 11/02/2011   Microperforation probably related to nut and popcorn consumption.  Resolved by CT scan Recurrent episode clinical dx 07/2012   . Diverticulosis of colon    sigmoid  . GERD (gastroesophageal reflux disease)   . H/O hiatal hernia   . History of kidney stones    "passed it" (02/19/2017)  . Hypercholesterolemia    history  . Left knee pain    occasionally  . Palpitations IRREGULAR HEARTBEAT--  CONTROLLED W/  BETA BLOCKER  . Pneumonia 2017   "walking pneumonia" (02/19/2017)  . Pre-diabetes    pcp is monitoring a1c currently 02/10/17  . Swelling of left knee joint     Family History  Problem Relation Age of Onset  . Heart attack Father   . Heart failure Sister   . Colon cancer Brother        died/age 14  . Lung cancer Sister   . Brain cancer Sister   . Cancer Brother        mouth  . Leukemia Brother     Past Surgical History:  Procedure Laterality Date  . BREAST  BIOPSY Right 1986  . BREAST RECONSTRUCTION Right 1987   W/ IMPLANTS  . CARDIAC CATHETERIZATION  12-22-2000  DR BRACKBILL   NORMAL LVF/ NORMAL CORONARY ARTERIES  . CHOLECYSTECTOMY OPEN  1988  . CHONDROPLASTY  09/08/2012   Procedure: CHONDROPLASTY;  Surgeon: Tobi Bastos, MD;  Location: [facility];  Service: Orthopedics;  Laterality: Left;  . COLONOSCOPY     last colon 05-03-2009 (02/19/2017)  . COLONOSCOPY W/ BIOPSIES AND POLYPECTOMY    . KNEE ARTHROSCOPY WITH MEDIAL MENISECTOMY  09/08/2012   Procedure: KNEE ARTHROSCOPY WITH MEDIAL MENISECTOMY;  Surgeon: Tobi Bastos, MD;  Location: [facility];  Service: Orthopedics;  Laterality: Left;  lateral tibial plateau,   . MASTECTOMY  Right 1986   W/ MULTIPLE NODE DISSECTION  . REDUCTION MAMMAPLASTY Left 1987  . TOTAL KNEE ARTHROPLASTY Left 07/21/2013   Procedure: TOTAL KNEE ARTHROPLASTY;  Surgeon: Newt Minion, MD;  Location: [place];  Service: Orthopedics;  Laterality: Left;  Left Total Knee Arthroplasty  . TOTAL KNEE ARTHROPLASTY Right 02/19/2017  . TOTAL KNEE ARTHROPLASTY Right 02/19/2017   Procedure: RIGHT TOTAL KNEE ARTHROPLASTY;  Surgeon: Newt Minion, MD;  Location: [place];  Service: Orthopedics;  Laterality: Right;  Marland Kitchen VAGINAL HYSTERECTOMY  1966   Social History   Occupational History  . Retired    Social History Main Topics  . Smoking status: Never Smoker  . Smokeless tobacco: Never Used  . Alcohol use 0.6 oz/week    1 Glasses of wine per week  . Drug use: No  . Sexual activity: Yes

## 2017-04-16 NOTE — Progress Notes (Signed)
Post-Op Visit Note   Patient: Leslie Bryant           Date of Birth: 03/20/1941           MRN: [id number] Visit Date: 04/16/2017 PCP: Biagio Borg, MD  Chief Complaint:  Chief Complaint  Patient presents with  . Right Knee - Pain    HPI:  The patient is a 76 year old woman seen 8 weeks status post right TKA. Has been feeling so/so. States this knee has hurt much more that the left when she had a TKA on this knee. Does ok during the day. Worse pain at night. States is using advil 600 mg tid. Wakes with knee pain at night, does relieve with percocet. Aching pain. Has completed PT, has joined a gym to continue her therapy.     Ortho Exam Incision is well healed. Minimal swelling. No erythema. No sign of infection. Does have full extension and flexion to 130.   Visit Diagnoses:  1. Total knee replacement status, right   2. Acute pain of right knee     Plan: follow up in office in 4 weeks if continued pain. Have provided another percocet prescription for use at night. Activities as tolerated. Encouraged water exercise. Continue ibu.  Follow-Up Instructions: Return in about 4 weeks (around 05/14/2017), or if symptoms worsen or fail to improve.   Imaging: Xr Knee 1-2 Views Right  Result Date: 04/16/2017 Stable alignment of right total knee arthroplasty. No complicating features.    Orders:  Orders Placed This Encounter  Procedures  . XR Knee 1-2 Views Right   No orders of the defined types were placed in this encounter.    PMFS History: Patient Active Problem List   Diagnosis Date Noted  . Abdominal pain 04/03/2017  . Diarrhea 04/03/2017  . Weight loss 04/03/2017  . Total knee replacement status, right 02/19/2017  . Bilateral otitis media with effusion 01/23/2017  . Preoperative cardiovascular examination 12/24/2016  . Cough 11/06/2016  . Hyponatremia 11/06/2016  . Diabetes ([place]) 11/06/2016  . Dysuria 04/30/2016  . Plantar fasciitis of right foot 12/28/2014  .  Metatarsal deformity 12/28/2014  . Unspecified deficiency anemia 06/09/2013  . Anemia, unspecified 05/06/2013  . Lower back pain 02/01/2013  . Osteoarthritis of left knee 09/08/2012  . Complex tear of medial meniscus of left knee as current injury 09/08/2012  . Lower abdominal pain 07/23/2012  . Encounter for well adult exam with abnormal findings 05/05/2012  . Family hx of colon cancer 05/05/2012  . Unilateral primary osteoarthritis, right knee 02/06/2011  . History of breast cancer 02/06/2011  . Palpitations   . Supraventricular tachycardia ([place])   . EUSTACHIAN TUBE DYSFUNCTION, LEFT 04/11/2010  . Anxiety state 05/05/2008  . Essential hypertension 05/05/2008  . Allergic rhinitis 05/05/2008  . IBS 05/05/2008  . OSTEOPENIA 05/05/2008  . COLONIC POLYPS, HX OF 05/05/2008  . Hyperlipidemia 05/29/2007  . GERD 05/29/2007   Past Medical History:  Diagnosis Date  . Acute meniscal tear of knee LEFT KNEE  . Arthritis    "knees" (02/19/2017)  . Breast cancer, right breast ([place]) 1986  . 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 Diverticulitis of large intestine with perforation 11/02/2011   Microperforation probably related to nut and popcorn consumption.  Resolved by CT scan Recurrent episode clinical dx 07/2012   . Diverticulosis of colon    sigmoid  . GERD (gastroesophageal reflux disease)   . H/O hiatal hernia   .  History of kidney stones    "passed it" (02/19/2017)  . Hypercholesterolemia    history  . Left knee pain    occasionally  . Palpitations IRREGULAR HEARTBEAT--  CONTROLLED W/  BETA BLOCKER  . Pneumonia 2017   "walking pneumonia" (02/19/2017)  . Pre-diabetes    pcp is monitoring a1c currently 02/10/17  . Swelling of left knee joint     Family History  Problem Relation Age of Onset  . Heart attack Father   . Heart failure Sister   . Colon cancer Brother        died/age 41  . Lung cancer Sister   . Brain cancer Sister   . Cancer Brother        mouth  . Leukemia Brother      Past Surgical History:  Procedure Laterality Date  . BREAST BIOPSY Right 1986  . BREAST RECONSTRUCTION Right 1987   W/ IMPLANTS  . CARDIAC CATHETERIZATION  12-22-2000  DR BRACKBILL   NORMAL LVF/ NORMAL CORONARY ARTERIES  . CHOLECYSTECTOMY OPEN  1988  . CHONDROPLASTY  09/08/2012   Procedure: CHONDROPLASTY;  Surgeon: Tobi Bastos, MD;  Location: [facility];  Service: Orthopedics;  Laterality: Left;  . COLONOSCOPY     last colon 05-03-2009 (02/19/2017)  . COLONOSCOPY W/ BIOPSIES AND POLYPECTOMY    . KNEE ARTHROSCOPY WITH MEDIAL MENISECTOMY  09/08/2012   Procedure: KNEE ARTHROSCOPY WITH MEDIAL MENISECTOMY;  Surgeon: Tobi Bastos, MD;  Location: [place];  Service: Orthopedics;  Laterality: Left;  lateral tibial plateau,   . MASTECTOMY Right 1986   W/ MULTIPLE NODE DISSECTION  . REDUCTION MAMMAPLASTY Left 1987  . TOTAL KNEE ARTHROPLASTY Left 07/21/2013   Procedure: TOTAL KNEE ARTHROPLASTY;  Surgeon: Newt Minion, MD;  Location: [facility];  Service: Orthopedics;  Laterality: Left;  Left Total Knee Arthroplasty  . TOTAL KNEE ARTHROPLASTY Right 02/19/2017  . TOTAL KNEE ARTHROPLASTY Right 02/19/2017   Procedure: RIGHT TOTAL KNEE ARTHROPLASTY;  Surgeon: Newt Minion, MD;  Location: [place];  Service: Orthopedics;  Laterality: Right;  Marland Kitchen VAGINAL HYSTERECTOMY  1966   Social History   Occupational History  . Retired    Social History Main Topics  . Smoking status: Never Smoker  . Smokeless tobacco: Never Used  . Alcohol use 0.6 oz/week    1 Glasses of wine per week  . Drug use: No  . Sexual activity: Yes

## 2017-06-02 ENCOUNTER — Other Ambulatory Visit: Payer: Self-pay | Admitting: Internal Medicine

## 2017-06-03 NOTE — Telephone Encounter (Signed)
Rx(s) sent to pharmacy electronically.  

## 2017-07-02 ENCOUNTER — Telehealth (INDEPENDENT_AMBULATORY_CARE_PROVIDER_SITE_OTHER): Payer: Self-pay | Admitting: Orthopedic Surgery

## 2017-07-02 NOTE — Telephone Encounter (Signed)
Patient called advised she went to the dentist today and was told she needed an antibiotic before they can clean her teeth. The number to contact patient is [phone]

## 2017-07-03 ENCOUNTER — Telehealth (INDEPENDENT_AMBULATORY_CARE_PROVIDER_SITE_OTHER): Payer: Self-pay | Admitting: Radiology

## 2017-07-03 MED ORDER — CEPHALEXIN 500 MG PO CAPS: ORAL_CAPSULE | ORAL | 2 refills | Status: DC

## 2017-07-03 NOTE — Telephone Encounter (Signed)
Use Keflex, her allergy to penicillin is itching and would be unlikely to have a reaction to the Keflex.

## 2017-07-03 NOTE — Telephone Encounter (Signed)
Patients dentist office is requiring her to be premedicated for dental procedure since she had total joint replacement. Was going to send in keflex, but she is allergic to penicillin. What antibiotic would you like to send in instead for dental prophylaxis.

## 2017-07-03 NOTE — Telephone Encounter (Signed)
I called patient advised her we would send in antibiotic for dental procedure. Originally was going to send in keflex, but patient has allergy to penicillin. Sent message to Dr. Sharol Given about this for him to advise. Will call patient back once we do send new antibiotic into her pharmacy.

## 2017-07-09 ENCOUNTER — Other Ambulatory Visit: Payer: Self-pay | Admitting: Internal Medicine

## 2017-07-09 DIAGNOSIS — Z1231 Encounter for screening mammogram for malignant neoplasm of breast: Secondary | ICD-10-CM

## 2017-07-10 ENCOUNTER — Other Ambulatory Visit: Payer: Self-pay | Admitting: Internal Medicine

## 2017-07-16 ENCOUNTER — Ambulatory Visit
Admission: RE | Admit: 2017-07-16 | Discharge: 2017-07-16 | Disposition: A | Discharge status: Home / Self Care | Payer: Medicare Other | Source: Ambulatory Visit | Attending: Internal Medicine | Admitting: Internal Medicine

## 2017-07-16 DIAGNOSIS — Z1231 Encounter for screening mammogram for malignant neoplasm of breast: Secondary | ICD-10-CM | POA: Diagnosis not present

## 2017-08-14 ENCOUNTER — Ambulatory Visit (INDEPENDENT_AMBULATORY_CARE_PROVIDER_SITE_OTHER): Payer: Medicare Other | Admitting: General Practice

## 2017-08-14 DIAGNOSIS — Z23 Encounter for immunization: Secondary | ICD-10-CM

## 2017-08-31 ENCOUNTER — Other Ambulatory Visit: Payer: Self-pay | Admitting: Internal Medicine

## 2017-09-01 NOTE — Telephone Encounter (Signed)
REFILL 

## 2017-09-22 ENCOUNTER — Other Ambulatory Visit: Payer: Self-pay | Admitting: Family Medicine

## 2017-09-22 ENCOUNTER — Ambulatory Visit (HOSPITAL_BASED_OUTPATIENT_CLINIC_OR_DEPARTMENT_OTHER)
Admission: RE | Admit: 2017-09-22 | Discharge: 2017-09-22 | Disposition: A | Discharge status: Home / Self Care | Payer: Medicare Other | Source: Ambulatory Visit | Attending: Family Medicine | Admitting: Family Medicine

## 2017-09-22 ENCOUNTER — Telehealth: Payer: Self-pay | Admitting: Internal Medicine

## 2017-09-22 ENCOUNTER — Encounter: Payer: Self-pay | Admitting: Family Medicine

## 2017-09-22 ENCOUNTER — Ambulatory Visit: Payer: Medicare Other | Admitting: Family Medicine

## 2017-09-22 VITALS — BP 120/82 | HR 78 | Temp 98.1°F | Ht 62.0 in

## 2017-09-22 DIAGNOSIS — M542 Cervicalgia: Secondary | ICD-10-CM | POA: Diagnosis not present

## 2017-09-22 DIAGNOSIS — I700 Atherosclerosis of aorta: Secondary | ICD-10-CM | POA: Diagnosis not present

## 2017-09-22 DIAGNOSIS — R079 Chest pain, unspecified: Secondary | ICD-10-CM | POA: Diagnosis not present

## 2017-09-22 LAB — CBC
HCT: 35.1 % — ABNORMAL LOW (ref 36.0–46.0)
HEMOGLOBIN: 11.7 g/dL — AB (ref 12.0–15.0)
MCHC: 33.2 g/dL (ref 30.0–36.0)
MCV: 93.2 fl (ref 78.0–100.0)
Platelets: 300 10*3/uL (ref 150.0–400.0)
RBC: 3.77 Mil/uL — ABNORMAL LOW (ref 3.87–5.11)
RDW: 12.5 % (ref 11.5–15.5)
WBC: 8 10*3/uL (ref 4.0–10.5)

## 2017-09-22 LAB — SEDIMENTATION RATE: Sed Rate: 57 mm/hr — ABNORMAL HIGH (ref 0–30)

## 2017-09-22 LAB — C-REACTIVE PROTEIN: CRP: 0.2 mg/dL — AB (ref 0.5–20.0)

## 2017-09-22 LAB — COMPREHENSIVE METABOLIC PANEL
ALT: 22 U/L (ref 0–35)
AST: 18 U/L (ref 0–37)
Albumin: 4.1 g/dL (ref 3.5–5.2)
Alkaline Phosphatase: 76 U/L (ref 39–117)
BILIRUBIN TOTAL: 0.2 mg/dL (ref 0.2–1.2)
BUN: 23 mg/dL (ref 6–23)
CO2: 29 meq/L (ref 19–32)
CREATININE: 1.07 mg/dL (ref 0.40–1.20)
Calcium: 9.6 mg/dL (ref 8.4–10.5)
Chloride: 101 mEq/L (ref 96–112)
GFR: 52.82 mL/min — ABNORMAL LOW (ref >60.00)
GLUCOSE: 217 mg/dL — AB (ref 70–99)
Potassium: 4.4 mEq/L (ref 3.5–5.1)
SODIUM: 135 meq/L (ref 135–145)
Total Protein: 7.4 g/dL (ref 6.0–8.3)

## 2017-09-22 NOTE — Telephone Encounter (Signed)
Pt scheduled to see Dr. Raeford Razor at [place] [facility] today.

## 2017-09-22 NOTE — Patient Instructions (Addendum)
Thank you for coming in,    We will call you with the results from today.   Try to increase your reflux medication and see if that helps.   If you notice a crushing chest pain that doesn't resolve then please seek immediate care.     Please feel free to call with any questions or concerns at any time, at [phone]. --Dr. Raeford Razor

## 2017-09-22 NOTE — Assessment & Plan Note (Addendum)
Unclear if this is cardiac in nature versus reflux. Not likely for pulmonary embolism. No recent viral illness to suspect pericarditis. No radiation to the back for dissection. Previous chest x-ray does show calcification of aorta and left carotid.  - EKG  - chest xray  - CMP, CBC, Ddimer  - given indications to seek immediate care.  - could consider sooner follow up with her cardiologist vs referral to GI.

## 2017-09-22 NOTE — Telephone Encounter (Signed)
Patient Name: Leslie Bryant  DOB: [birth year]    Initial Comment Caller states she has been having constant aching chest pain    Nurse Assessment  Nurse: Raphael Gibney, RN, Vanita Ingles Date/Time (Eastern Time): 09/22/2017 9:55:52 AM  Confirm and document reason for call. If symptomatic, describe symptoms. ---Caller states she is having chest pain in the center of her chest. Not feeling any skipped beats. Has been chest pain since late last week. Has also been having pain across her shoulders. She is also SOB.  Does the patient have any new or worsening symptoms? ---Yes  Will a triage be completed? ---Yes  Related visit to physician within the last 2 weeks? ---No  Does the PT have any chronic conditions? (i.e. diabetes, asthma, etc.) ---No  Is this a behavioral health or substance abuse call? ---No     Guidelines    Guideline Title Affirmed Question Affirmed Notes  Chest Pain [1] Chest pain lasts > 5 minutes AND [2] age > 52    Final Disposition User   Call EMS 911 Now Raphael Gibney, RN, Vanita Ingles    Comments  pt states she does not want to call 911 but will go to the ER.   Referrals  [facility] - ED   Caller Disagree/Comply Disagree  Caller Understands Yes  PreDisposition Call Doctor

## 2017-09-22 NOTE — Progress Notes (Addendum)
Leslie Bryant - 76 y.o. female MRN [id number]  Date of birth: 1941-02-05  SUBJECTIVE:  Including CC & ROS.  Chief Complaint  Patient presents with  . Chest Pain    x1 wk, difficulty taking deep breath  . Shoulder Pain    bilateral     Leslie Bryant is a 76 y.o. female that is presenting with chest pain and bilateral shoulder pain. The chest pain is sternal in nature. Its an ache that has been present for one week. The pain is localized. Denies any history of travel or blood clot. Had knee replacement in April of this year. Denies any shortness of breath. Denies any cough or blood tension sputum. Has a history of reflux and has been increasing her dose of Prilosec with limited improvement. Symptoms not be associated with activity. Her symptoms last hours in duration when they occur. Denies any history of diabetes or thyroid disease. Has a history of supraventricular tachycardia and takes metoprolol. Her symptoms seem to be worsening since they started. Last night was the worst and that it lasted all night long. Describes the shoulder pain and that is occurring in her upper trapezius. Denies any injury. Denies any significant weakness in the bilateral shoulders. No numbness or tingling. No history of asthma or COPD  Review of the chest x-ray from January 2018 shows aortic atherosclerosis and left carotid artery calcification  Review of the EKG from February 2018 shows normal sinus rhythm.   Review of Systems  Constitutional: Negative for fever.  Respiratory: Negative for cough and shortness of breath.   Cardiovascular: Positive for chest pain. Negative for leg swelling.  Gastrointestinal: Negative for abdominal pain.  Skin: Negative for color change.  Neurological: Negative for weakness and numbness.  Psychiatric/Behavioral: Negative for agitation.    HISTORY: Past Medical, Surgical, Social, and Family History Reviewed & Updated per EMR.   Pertinent Historical Findings include:  Past Medical  History:  Diagnosis Date  . Acute meniscal tear of knee LEFT KNEE  . Arthritis    "knees" (02/19/2017)  . Breast cancer, right breast ([place]) 1986  . 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 Diverticulitis of large intestine with perforation 11/02/2011   Microperforation probably related to nut and popcorn consumption.  Resolved by CT scan Recurrent episode clinical dx 07/2012   . Diverticulosis of colon    sigmoid  . GERD (gastroesophageal reflux disease)   . H/O hiatal hernia   . History of kidney stones    "passed it" (02/19/2017)  . Hypercholesterolemia    history  . Left knee pain    occasionally  . Palpitations IRREGULAR HEARTBEAT--  CONTROLLED W/  BETA BLOCKER  . Pneumonia 2017   "walking pneumonia" (02/19/2017)  . Pre-diabetes    pcp is monitoring a1c currently 02/10/17  . Swelling of left knee joint     Past Surgical History:  Procedure Laterality Date  . BREAST BIOPSY Right 1986  . BREAST BIOPSY Left   . BREAST RECONSTRUCTION Right 1987   W/ IMPLANTS  . CARDIAC CATHETERIZATION  12-22-2000  DR BRACKBILL   NORMAL LVF/ NORMAL CORONARY ARTERIES  . CHOLECYSTECTOMY OPEN  1988  . CHONDROPLASTY Left 09/08/2012   Performed by Tobi [facility]os[facility] at [facility]  . COLONOSCOPY     last colon 05-03-2009 (02/19/2017)  . COLONOSCOPY W/ BIOPSIES AND POLYPECTOMY    . KNEE ARTHROSCOPY WITH MEDIAL MENISECTOMY Left 09/08/2012   Performed by Tobi Bastos, MD at  [place]  . MASTECTOMY Right 1986   W/ MULTIPLE NODE DISSECTION  . REDUCTION MAMMAPLASTY Left 1987  . RIGHT TOTAL KNEE ARTHROPLASTY Right 02/19/2017   Performed by Newt Minion, MD at [place]  . TOTAL KNEE ARTHROPLASTY Right 02/19/2017  . TOTAL KNEE ARTHROPLASTY Left 07/21/2013   Performed by Newt Minion, MD at [place]  . VAGINAL HYSTERECTOMY  1966    Allergies  Allergen Reactions  . Crestor [Rosuvastatin Calcium] Other (See Comments)    Shoulder pain  . Lipitor [Atorvastatin Calcium] Other  (See Comments)    Myalgias   . Pravastatin Other (See Comments)    myalgias  . Penicillins Itching    Has patient had a PCN reaction causing immediate rash, facial/tongue/throat swelling, SOB or lightheadedness with hypotension: No Has patient had a PCN reaction causing severe rash involving mucus membranes or skin necrosis: No Has patient had a PCN reaction that required hospitalization No Has patient had a PCN reaction occurring within the last 10 years: No If all of the above answers are "NO", then may proceed with Cephalosporin use.     Family History  Problem Relation Age of Onset  . Heart attack Father   . Heart failure Sister   . Colon cancer Brother        died/age 77  . Lung cancer Sister   . Brain cancer Sister   . Cancer Brother        mouth  . Leukemia Brother      Social History   Socioeconomic History  . Marital status: Married    Spouse name: Not on file  . Number of children: 2  . Years of education: Not on file  . Highest education level: Not on file  Social Needs  . Financial resource strain: Not on file  . Food insecurity - worry: Not on file  . Food insecurity - inability: Not on file  . Transportation needs - medical: Not on file  . Transportation needs - non-medical: Not on file  Occupational History  . Occupation: Retired  Tobacco Use  . Smoking status: Never Smoker  . Smokeless tobacco: Never Used  Substance and Sexual Activity  . Alcohol use: Yes    Alcohol/week: 0.6 oz    Types: 1 Glasses of wine per week  . Drug use: No  . Sexual activity: Yes  Other Topics Concern  . Not on file  Social History Narrative  . Not on file     PHYSICAL EXAM:  VS: BP 120/82   Pulse 78   Temp 98.1 F (36.7 C) (Oral)   Ht 5\' 2"  (1.575 m)   BMI 27.44 kg/m  Physical Exam Gen: NAD, alert, cooperative with exam, well-appearing ENT: normal lips, normal nasal mucosa,  Eye: normal EOM, normal conjunctiva and lids CV:  no edema, +2 pedal pulses,  S1-S2, regular rate and rhythm   Resp: no accessory muscle use, non-labored, clear to auscultation bilaterally, no crackles or wheezes GI: no masses or tenderness, no hernia  Skin: no rashes, no areas of induration  Neuro: normal tone, normal sensation to touch Psych:  normal insight, alert and oriented MSK: Normal range of motion of the shoulders bilaterally, normal grip strength, normal strength resistance with shoulder shrug, neurovascularly intact   I have independently reviewed the EKG.  EKG interpretation: normal sinus rhythm     ASSESSMENT & PLAN:   Chest pain Unclear if this is cardiac in nature versus reflux. Not likely  for pulmonary embolism. No recent viral illness to suspect pericarditis. No radiation to the back for dissection. Previous chest x-ray does show calcification of aorta and left carotid.  - EKG  - chest xray  - CMP, CBC, Ddimer  - given indications to seek immediate care.  - could consider sooner follow up with her cardiologist vs referral to GI.   Neck pain Possible that this is related to his chest pain. Suspect polymyalgia rheumatica. Does not appear to be an actual shoulder girdle itself. This appears to be more in her trapezius. - CRP, sed rate - may need to do an carotid dopplers

## 2017-09-22 NOTE — Assessment & Plan Note (Signed)
Possible that this is related to his chest pain. Suspect polymyalgia rheumatica. Does not appear to be an actual shoulder girdle itself. This appears to be more in her trapezius. - CRP, sed rate - may need to do an carotid dopplers

## 2017-09-23 ENCOUNTER — Telehealth: Payer: Self-pay | Admitting: Family Medicine

## 2017-09-23 ENCOUNTER — Ambulatory Visit: Payer: Medicare Other | Admitting: Nurse Practitioner

## 2017-09-23 DIAGNOSIS — R079 Chest pain, unspecified: Secondary | ICD-10-CM

## 2017-09-23 DIAGNOSIS — M542 Cervicalgia: Secondary | ICD-10-CM

## 2017-09-23 NOTE — Telephone Encounter (Signed)
Copied from [place] [id number]. Topic: Quick Communication - See Telephone Encounter >> Sep 23, 2017  2:59 PM Robina Ade, Helene Kelp D wrote: CRM for notification. See Telephone encounter for: 09/23/17. Patient would like to talk to [place] or provider  about a test she would like to have done. Please call patient back, thanks.

## 2017-09-23 NOTE — Telephone Encounter (Signed)
Spoke with patient she would like to proceed with CT angio.

## 2017-09-23 NOTE — Telephone Encounter (Signed)
Spoke with patient about her results. She will see how she feels and give a call back tomorrow to see if she will purse a CT angio.   Rosemarie Ax, MD [facility] 09/23/2017, 1:41 PM

## 2017-09-23 NOTE — Telephone Encounter (Signed)
Copied from [place] [id number]. Topic: Inquiry >> Sep 23, 2017 12:28 PM Cecelia Byars, NT wrote: Reason for BZM:CEYEMVV called and wants results had call from Dr Raeford Razor and would like a return call at [phone]

## 2017-09-23 NOTE — Telephone Encounter (Signed)
Ordered CT angio.   Rosemarie Ax, MD [facility] 09/23/2017, 3:31 PM

## 2017-09-23 NOTE — Telephone Encounter (Signed)
Left VM for patient. If she calls back please have her speak with a nurse/CMA and inform that her lab work is at her baseline or normal. The chest xray didn't show signs of infection. Ddimer was elevated but normal for age adjustment. If symptoms are still present then could consider obtaining CT angio.   If any questions then please take the best time and phone number to call and I will try to call her back.   Rosemarie Ax, MD [place] [facility] 09/23/2017, 10:21 AM

## 2017-09-24 ENCOUNTER — Other Ambulatory Visit: Payer: Self-pay | Admitting: *Deleted

## 2017-09-24 ENCOUNTER — Encounter (HOSPITAL_BASED_OUTPATIENT_CLINIC_OR_DEPARTMENT_OTHER): Payer: Self-pay

## 2017-09-24 ENCOUNTER — Other Ambulatory Visit (INDEPENDENT_AMBULATORY_CARE_PROVIDER_SITE_OTHER): Payer: Medicare Other

## 2017-09-24 ENCOUNTER — Ambulatory Visit (HOSPITAL_BASED_OUTPATIENT_CLINIC_OR_DEPARTMENT_OTHER)
Admission: RE | Admit: 2017-09-24 | Discharge: 2017-09-24 | Disposition: A | Discharge status: Home / Self Care | Payer: Medicare Other | Source: Ambulatory Visit | Attending: Family Medicine | Admitting: Family Medicine

## 2017-09-24 DIAGNOSIS — I700 Atherosclerosis of aorta: Secondary | ICD-10-CM | POA: Diagnosis not present

## 2017-09-24 DIAGNOSIS — M542 Cervicalgia: Secondary | ICD-10-CM | POA: Diagnosis present

## 2017-09-24 DIAGNOSIS — R700 Elevated erythrocyte sedimentation rate: Secondary | ICD-10-CM

## 2017-09-24 DIAGNOSIS — Z9011 Acquired absence of right breast and nipple: Secondary | ICD-10-CM | POA: Diagnosis not present

## 2017-09-24 DIAGNOSIS — J9811 Atelectasis: Secondary | ICD-10-CM | POA: Diagnosis not present

## 2017-09-24 DIAGNOSIS — R079 Chest pain, unspecified: Secondary | ICD-10-CM | POA: Diagnosis not present

## 2017-09-24 LAB — C-REACTIVE PROTEIN: CRP: 0.3 mg/dL — ABNORMAL LOW (ref 0.5–20.0)

## 2017-09-24 LAB — D-DIMER, QUANTITATIVE: D-Dimer, Quant: 1.16 mcg/mL FEU — ABNORMAL HIGH (ref <0.50)

## 2017-09-24 MED ORDER — IOPAMIDOL (ISOVUE-370) INJECTION 76%
100.0000 mL | Freq: Once | INTRAVENOUS | Status: AC | PRN
  Administered 2017-09-24: 100 mL via INTRAVENOUS

## 2017-09-26 LAB — ANA: ANA: NEGATIVE

## 2017-09-26 LAB — CALCIUM, IONIZED: CALCIUM ION: 5.3 mg/dL (ref 4.8–5.6)

## 2017-09-26 LAB — PTH, INTACT AND CALCIUM
CALCIUM: 9.7 mg/dL (ref 8.6–10.4)
PTH: 37 pg/mL (ref 14–64)

## 2017-09-26 LAB — ANGIOTENSIN CONVERTING ENZYME: ANGIOTENSIN-CONVERTING ENZYME: 84 U/L — AB (ref 9–67)

## 2017-09-26 LAB — ANCA SCREEN W REFLEX TITER: ANCA Screen: NEGATIVE

## 2017-09-26 LAB — RHEUMATOID FACTOR

## 2017-10-06 ENCOUNTER — Telehealth: Payer: Self-pay | Admitting: Family Medicine

## 2017-10-06 NOTE — Telephone Encounter (Signed)
Spoke with patient-patient viewed results on Mychart-wanted to verify tests-given results and states she is doing well. Will notify the office if shs has no improvement.

## 2017-10-06 NOTE — Telephone Encounter (Signed)
Copied from [place] [id number]. Topic: Quick Communication - See Telephone Encounter >> Oct 06, 2017 11:44 AM Bea Graff, NT wrote: CRM for notification. See Telephone encounter for: Patient calling and would like results of test she had done by Dr. Raeford Razor.   10/06/17.

## 2017-10-16 ENCOUNTER — Ambulatory Visit: Payer: Medicare Other | Admitting: Family Medicine

## 2017-10-16 ENCOUNTER — Encounter: Payer: Self-pay | Admitting: Family Medicine

## 2017-10-16 VITALS — BP 132/68 | HR 68 | Temp 98.4°F | Ht 62.0 in | Wt 150.0 lb

## 2017-10-16 DIAGNOSIS — R059 Cough, unspecified: Secondary | ICD-10-CM

## 2017-10-16 DIAGNOSIS — R05 Cough: Secondary | ICD-10-CM | POA: Diagnosis not present

## 2017-10-16 MED ORDER — AZITHROMYCIN 250 MG PO TABS: ORAL_TABLET | ORAL | 0 refills | Status: DC

## 2017-10-16 NOTE — Assessment & Plan Note (Signed)
Symptoms are persistent for a couple of weeks. Possible for viral source versus bronchitis - Provided azithromycin   -Counseled on supportive care

## 2017-10-16 NOTE — Patient Instructions (Addendum)
Thank you for coming in,   Please try things such as zyrtec-D or allegra-D which is an antihistamine and decongestant.   Please try afrin which will help with nasal congestion but use for only three days.   Please also try using a netti pot on a regular occasion.  Honey can help with a sore throat.      Please feel free to call with any questions or concerns at any time, at [phone]. --Dr. Lashundra Shiveley  

## 2017-10-16 NOTE — Progress Notes (Signed)
Leslie Bryant - 76 y.o. female MRN [id number]  Date of birth: 1941/02/29  SUBJECTIVE:  Including CC & ROS.  Chief Complaint  Patient presents with  . Cough    Leslie Bryant is a 76 y.o. female that is here for an evaluation of a cough. It has been present since 2 weeks. She describes the cough as a dry cough-worsens at night. She has been having headaches and congestion. She took some mucinex and Nyquil with no improvement. Denies fevers, chills or body aches. Denies any fevers or tooth pain. Denies any ear pain or discharge. Feels like her symptoms are staying the same.   Review of Systems  Constitutional: Negative for fever.  HENT: Positive for sinus pressure.   Respiratory: Positive for cough. Negative for shortness of breath.   Cardiovascular: Negative for chest pain.    HISTORY: Past Medical, Surgical, Social, and Family History Reviewed & Updated per EMR.   Pertinent Historical Findings include:  Past Medical History:  Diagnosis Date  . Acute meniscal tear of knee LEFT KNEE  . Arthritis    "knees" (02/19/2017)  . Breast cancer, right breast ([facility] [facility]) 1986  . 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 Diverticulitis of large intestine with perforation 11/02/2011   Microperforation probably related to nut and popcorn consumption.  Resolved by CT scan Recurrent episode clinical dx 07/2012   . Diverticulosis of colon    sigmoid  . GERD (gastroesophageal reflux disease)   . H/O hiatal hernia   . History of kidney stones    "passed it" (02/19/2017)  . Hypercholesterolemia    history  . Left knee pain    occasionally  . Palpitations IRREGULAR HEARTBEAT--  CONTROLLED W/  BETA BLOCKER  . Pneumonia 2017   "walking pneumonia" (02/19/2017)  . Pre-diabetes    pcp is monitoring a1c currently 02/10/17  . Swelling of left knee joint     Past Surgical History:  Procedure Laterality Date  . BREAST BIOPSY Right 1986  . BREAST BIOPSY Left   . BREAST RECONSTRUCTION Right 1987   W/ IMPLANTS  .  CARDIAC CATHETERIZATION  12-22-2000  DR BRACKBILL   NORMAL LVF/ NORMAL CORONARY ARTERIES  . CHOLECYSTECTOMY OPEN  1988  . CHONDROPLASTY  09/08/2012   Procedure: CHONDROPLASTY;  Surgeon: Tobi Bastos, MD;  Location: [facility];  Service: Orthopedics;  Laterality: Left;  . COLONOSCOPY     last colon 05-03-2009 (02/19/2017)  . COLONOSCOPY W/ BIOPSIES AND POLYPECTOMY    . KNEE ARTHROSCOPY WITH MEDIAL MENISECTOMY  09/08/2012   Procedure: KNEE ARTHROSCOPY WITH MEDIAL MENISECTOMY;  Surgeon: Tobi Bastos, MD;  Location: [place];  Service: Orthopedics;  Laterality: Left;  lateral tibial plateau,   . MASTECTOMY Right 1986   W/ MULTIPLE NODE DISSECTION  . REDUCTION MAMMAPLASTY Left 1987  . TOTAL KNEE ARTHROPLASTY Left 07/21/2013   Procedure: TOTAL KNEE ARTHROPLASTY;  Surgeon: Newt Minion, MD;  Location: [place];  Service: Orthopedics;  Laterality: Left;  Left Total Knee Arthroplasty  . TOTAL KNEE ARTHROPLASTY Right 02/19/2017  . TOTAL KNEE ARTHROPLASTY Right 02/19/2017   Procedure: RIGHT TOTAL KNEE ARTHROPLASTY;  Surgeon: Newt Minion, MD;  Location: [place];  Service: Orthopedics;  Laterality: Right;  Marland Kitchen VAGINAL HYSTERECTOMY  1966    Allergies  Allergen Reactions  . Crestor [Rosuvastatin Calcium] Other (See Comments)    Shoulder pain  . Lipitor [Atorvastatin Calcium] Other (See Comments)    Myalgias   . Pravastatin  Other (See Comments)    myalgias  . Penicillins Itching    Has patient had a PCN reaction causing immediate rash, facial/tongue/throat swelling, SOB or lightheadedness with hypotension: No Has patient had a PCN reaction causing severe rash involving mucus membranes or skin necrosis: No Has patient had a PCN reaction that required hospitalization No Has patient had a PCN reaction occurring within the last 10 years: No If all of the above answers are "NO", then may proceed with Cephalosporin use.     Family History  Problem Relation Age of  Onset  . Heart attack Father   . Heart failure Sister   . Colon cancer Brother        died/age 8  . Lung cancer Sister   . Brain cancer Sister   . Cancer Brother        mouth  . Leukemia Brother      Social History   Socioeconomic History  . Marital status: Married    Spouse name: Not on file  . Number of children: 2  . Years of education: Not on file  . Highest education level: Not on file  Social Needs  . Financial resource strain: Not on file  . Food insecurity - worry: Not on file  . Food insecurity - inability: Not on file  . Transportation needs - medical: Not on file  . Transportation needs - non-medical: Not on file  Occupational History  . Occupation: Retired  Tobacco Use  . Smoking status: Never Smoker  . Smokeless tobacco: Never Used  Substance and Sexual Activity  . Alcohol use: Yes    Alcohol/week: 0.6 oz    Types: 1 Glasses of wine per week  . Drug use: No  . Sexual activity: Yes  Other Topics Concern  . Not on file  Social History Narrative  . Not on file     PHYSICAL EXAM:  VS: BP 132/68 (BP Location: Left Arm, Patient Position: Sitting, Cuff Size: Normal)   Pulse 68   Temp 98.4 F (36.9 C) (Oral)   Ht 5\' 2"  (1.575 m)   Wt 150 lb (68 kg)   SpO2 99%   BMI 27.44 kg/m  Physical Exam Gen: NAD, alert, cooperative with exam,  ENT: normal lips, normal nasal mucosa, tympanic membranes c,lear and intact bilaterally, normal oropharynx, no significant pain to palpation of the frontal maxillary sinus, no cervical lymphadenopathy Eye: normal EOM, normal conjunctiva and lids CV:  no edema, +2 pedal pulses, regular rate and rhythm, S1-S2   Resp: no accessory muscle use, non-labored, clear to auscultation bilaterally, no crackles or wheezes  Skin: no rashes, no areas of induration  Neuro: normal tone, normal sensation to touch Psych:  normal insight, alert and oriented MSK: Normal gait, normal strength      ASSESSMENT & PLAN:   Cough Symptoms  are persistent for a couple of weeks. Possible for viral source versus bronchitis - Provided azithromycin   -Counseled on supportive care

## 2017-11-28 ENCOUNTER — Other Ambulatory Visit: Payer: Self-pay | Admitting: Internal Medicine

## 2017-11-28 NOTE — Telephone Encounter (Signed)
Rx has been sent to the pharmacy electronically. ° °

## 2017-12-13 ENCOUNTER — Other Ambulatory Visit: Payer: Self-pay

## 2017-12-13 ENCOUNTER — Emergency Department (HOSPITAL_COMMUNITY): Payer: Medicare Other

## 2017-12-13 ENCOUNTER — Encounter (HOSPITAL_COMMUNITY): Payer: Self-pay

## 2017-12-13 ENCOUNTER — Emergency Department (HOSPITAL_COMMUNITY)
Admission: EM | Admit: 2017-12-13 | Discharge: 2017-12-13 | Disposition: A | Discharge status: Home / Self Care | Payer: Medicare Other | Attending: Emergency Medicine | Admitting: Emergency Medicine

## 2017-12-13 DIAGNOSIS — Z96653 Presence of artificial knee joint, bilateral: Secondary | ICD-10-CM | POA: Diagnosis not present

## 2017-12-13 DIAGNOSIS — Z7982 Long term (current) use of aspirin: Secondary | ICD-10-CM | POA: Insufficient documentation

## 2017-12-13 DIAGNOSIS — R002 Palpitations: Secondary | ICD-10-CM

## 2017-12-13 DIAGNOSIS — R079 Chest pain, unspecified: Secondary | ICD-10-CM | POA: Diagnosis not present

## 2017-12-13 DIAGNOSIS — R0789 Other chest pain: Secondary | ICD-10-CM

## 2017-12-13 DIAGNOSIS — E119 Type 2 diabetes mellitus without complications: Secondary | ICD-10-CM | POA: Diagnosis not present

## 2017-12-13 DIAGNOSIS — Z79899 Other long term (current) drug therapy: Secondary | ICD-10-CM | POA: Diagnosis not present

## 2017-12-13 LAB — I-STAT TROPONIN, ED
TROPONIN I, POC: 0.02 ng/mL (ref 0.00–0.08)
Troponin i, poc: 0.03 ng/mL (ref 0.00–0.08)

## 2017-12-13 LAB — CBC
HEMATOCRIT: 38 % (ref 36.0–46.0)
Hemoglobin: 12.5 g/dL (ref 12.0–15.0)
MCH: 30.1 pg (ref 26.0–34.0)
MCHC: 32.9 g/dL (ref 30.0–36.0)
MCV: 91.6 fL (ref 78.0–100.0)
Platelets: 308 10*3/uL (ref 150–400)
RBC: 4.15 MIL/uL (ref 3.87–5.11)
RDW: 12.4 % (ref 11.5–15.5)
WBC: 8.6 10*3/uL (ref 4.0–10.5)

## 2017-12-13 LAB — BASIC METABOLIC PANEL
Anion gap: 11 (ref 5–15)
BUN: 14 mg/dL (ref 6–20)
CHLORIDE: 102 mmol/L (ref 101–111)
CO2: 22 mmol/L (ref 22–32)
Calcium: 9.3 mg/dL (ref 8.9–10.3)
Creatinine, Ser: 0.86 mg/dL (ref 0.44–1.00)
GFR calc Af Amer: >60 mL/min (ref >60)
GFR calc non Af Amer: >60 mL/min (ref >60)
Glucose, Bld: 154 mg/dL — ABNORMAL HIGH (ref 65–99)
POTASSIUM: 3.6 mmol/L (ref 3.5–5.1)
SODIUM: 135 mmol/L (ref 135–145)

## 2017-12-13 NOTE — ED Provider Notes (Signed)
[facility] EMERGENCY DEPARTMENT Provider Note   CSN: [id number] Arrival date & time: 12/13/17  1610     History   Chief Complaint Chief Complaint  Patient presents with  . Palpitations    HPI Leslie Bryant is a 77 y.o. female.  The history is provided by the patient. No language interpreter was used.  Palpitations     Leslie Bryant is a 77 y.o. female who presents to the Emergency Department complaining of palpitations.  She reports intermittent palpitations that has been present for the last 2 days.  Symptoms are waxing and waning.  She did have 2 episodes of chest discomfort yesterday.  The first episode got better when she laid flat.  The second episode occurred after she ate pizza.  She drinks Dr. Malachi Bonds and that did seem to improve the chest discomfort.  She does not think it feels like indigestion.  She denies any fevers, vomiting, abdominal pain, leg swelling or pain, shortness of breath.  She does have a mild cough that is present when she tries to take a deep breath.  She has a history of recurrent palpitations and does take metoprolol twice daily.  Her metoprolol dose was increased about 6 months ago.  She is taking her metoprolol as directed. Past Medical History:  Diagnosis Date  . Acute meniscal tear of knee LEFT KNEE  . Arthritis    "knees" (02/19/2017)  . Breast cancer, right breast ([place]) 1986  . 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 Diverticulitis of large intestine with perforation 11/02/2011   Microperforation probably related to nut and popcorn consumption.  Resolved by CT scan Recurrent episode clinical dx 07/2012   . Diverticulosis of colon    sigmoid  . GERD (gastroesophageal reflux disease)   . H/O hiatal hernia   . History of kidney stones    "passed it" (02/19/2017)  . Hypercholesterolemia    history  . Left knee pain    occasionally  . Palpitations IRREGULAR HEARTBEAT--  CONTROLLED W/  BETA BLOCKER  . Pneumonia 2017   "walking  pneumonia" (02/19/2017)  . Pre-diabetes    pcp is monitoring a1c currently 02/10/17  . Swelling of left knee joint     Patient Active Problem List   Diagnosis Date Noted  . Chest pain 09/22/2017  . Neck pain 09/22/2017  . Abdominal pain 04/03/2017  . Diarrhea 04/03/2017  . Weight loss 04/03/2017  . Total knee replacement status, right 02/19/2017  . Bilateral otitis media with effusion 01/23/2017  . Preoperative cardiovascular examination 12/24/2016  . Cough 11/06/2016  . Hyponatremia 11/06/2016  . Diabetes ([place]) 11/06/2016  . Dysuria 04/30/2016  . Plantar fasciitis of right foot 12/28/2014  . Metatarsal deformity 12/28/2014  . Unspecified deficiency anemia 06/09/2013  . Anemia, unspecified 05/06/2013  . Lower back pain 02/01/2013  . Osteoarthritis of left knee 09/08/2012  . Complex tear of medial meniscus of left knee as current injury 09/08/2012  . Lower abdominal pain 07/23/2012  . Encounter for well adult exam with abnormal findings 05/05/2012  . Family hx of colon cancer 05/05/2012  . Unilateral primary osteoarthritis, right knee 02/06/2011  . History of breast cancer 02/06/2011  . Palpitations   . Supraventricular tachycardia ([place])   . EUSTACHIAN TUBE DYSFUNCTION, LEFT 04/11/2010  . Anxiety state 05/05/2008  . Essential hypertension 05/05/2008  . Allergic rhinitis 05/05/2008  . IBS 05/05/2008  . OSTEOPENIA 05/05/2008  . COLONIC POLYPS, HX OF 05/05/2008  .  Hyperlipidemia 05/29/2007  . GERD 05/29/2007    Past Surgical History:  Procedure Laterality Date  . BREAST BIOPSY Right 1986  . BREAST BIOPSY Left   . BREAST RECONSTRUCTION Right 1987   W/ IMPLANTS  . CARDIAC CATHETERIZATION  12-22-2000  DR BRACKBILL   NORMAL LVF/ NORMAL CORONARY ARTERIES  . CHOLECYSTECTOMY OPEN  1988  . CHONDROPLASTY  09/08/2012   Procedure: CHONDROPLASTY;  Surgeon: Tobi Bastos, MD;  Location: [facility];  Service: Orthopedics;  Laterality: Left;  . COLONOSCOPY      last colon 05-03-2009 (02/19/2017)  . COLONOSCOPY W/ BIOPSIES AND POLYPECTOMY    . KNEE ARTHROSCOPY WITH MEDIAL MENISECTOMY  09/08/2012   Procedure: KNEE ARTHROSCOPY WITH MEDIAL MENISECTOMY;  Surgeon: Tobi Bastos, MD;  Location: [facility];  Service: Orthopedics;  Laterality: Left;  lateral tibial plateau,   . MASTECTOMY Right 1986   W/ MULTIPLE NODE DISSECTION  . REDUCTION MAMMAPLASTY Left 1987  . TOTAL KNEE ARTHROPLASTY Left 07/21/2013   Procedure: TOTAL KNEE ARTHROPLASTY;  Surgeon: Newt Minion, MD;  Location: [place];  Service: Orthopedics;  Laterality: Left;  Left Total Knee Arthroplasty  . TOTAL KNEE ARTHROPLASTY Right 02/19/2017  . TOTAL KNEE ARTHROPLASTY Right 02/19/2017   Procedure: RIGHT TOTAL KNEE ARTHROPLASTY;  Surgeon: Newt Minion, MD;  Location: [place];  Service: Orthopedics;  Laterality: Right;  Marland Kitchen VAGINAL HYSTERECTOMY  1966    OB History    No data available       Home Medications    Prior to Admission medications   Medication Sig Start Date End Date Taking? Authorizing Provider  aspirin 81 MG tablet Take 81 mg by mouth 2 (two) times daily.   Yes [provider]  BIOTIN 5000 PO Take 5,000 mcg by mouth daily.    Yes [provider]  co-enzyme Q-10 30 MG capsule Take 30 mg by mouth 2 (two) times daily.    Yes [provider]  ezetimibe (ZETIA) 10 MG tablet TAKE 1 TABLET (10 MG TOTAL) BY MOUTH DAILY. 06/03/17  Yes Hilty, Nadean Corwin, MD  fish oil-omega-3 fatty acids 1000 MG capsule Take 1 g by mouth daily.    Yes [provider]  ibuprofen (ADVIL,MOTRIN) 200 MG tablet Take 400 mg by mouth 2 (two) times daily as needed for moderate pain.   Yes [provider]  metoprolol tartrate (LOPRESSOR) 50 MG tablet TAKE 1 TABLET BY MOUTH TWICE A DAY Patient taking differently: TAKE 1 TABLET (50mg ) BY MOUTH TWICE A DAY 07/10/17  Yes Biagio Borg, MD  Multiple Vitamins-Minerals (CENTRUM SILVER PO) Take 1 tablet by mouth every  morning.    Yes [provider]  omeprazole (PRILOSEC) 40 MG capsule TAKE 1 CAPSULE (40 MG TOTAL) BY MOUTH DAILY. Patient taking differently: Take 40 mg by mouth 2 (two) times daily.  11/28/17  Yes Hilty, Nadean Corwin, MD  Probiotic Product (ALIGN) 4 MG CAPS Take 4 mg by mouth at bedtime.    Yes [provider]    Family History Family History  Problem Relation Age of Onset  . Heart attack Father   . Heart failure Sister   . Colon cancer Brother        died/age 64  . Lung cancer Sister   . Brain cancer Sister   . Cancer Brother        mouth  . Leukemia Brother     Social History Social History   Tobacco Use  . Smoking  status: Never Smoker  . Smokeless tobacco: Never Used  Substance Use Topics  . Alcohol use: Yes    Alcohol/week: 0.6 oz    Types: 1 Glasses of wine per week  . Drug use: No     Allergies   Crestor [rosuvastatin calcium]; Lipitor [atorvastatin calcium]; Pravastatin; and Penicillins   Review of Systems Review of Systems  Cardiovascular: Positive for palpitations.  All other systems reviewed and are negative.    Physical Exam Updated Vital Signs BP 123/69   Pulse 87   Temp 98 F (36.7 C) (Oral)   Resp 13   Wt 68 kg (150 lb)   SpO2 99%   BMI 27.44 kg/m   Physical Exam  Constitutional: She is oriented to person, place, and time. She appears well-developed and well-nourished.  HENT:  Head: Normocephalic and atraumatic.  Cardiovascular: Normal rate and regular rhythm.  No murmur heard. Pulmonary/Chest: Effort normal and breath sounds normal. No respiratory distress.  Abdominal: Soft. There is no tenderness. There is no rebound and no guarding.  Musculoskeletal: She exhibits no edema or tenderness.  Neurological: She is alert and oriented to person, place, and time.  Skin: Skin is warm and dry.  Psychiatric: She has a normal mood and affect. Her behavior is normal.  Nursing note and vitals reviewed.    ED Treatments /  Results  Labs (all labs ordered are listed, but only abnormal results are displayed) Labs Reviewed  BASIC METABOLIC PANEL - Abnormal; Notable for the following components:      Result Value   Glucose, Bld 154 (*)    All other components within normal limits  CBC  I-STAT TROPONIN, ED  I-STAT TROPONIN, ED    EKG  EKG Interpretation  Date/Time:  Saturday December 13 2017 16:13:33 EST Ventricular Rate:  98 PR Interval:  172 QRS Duration: 74 QT Interval:  376 QTC Calculation: 480 R Axis:   5 Text Interpretation:  Sinus rhythm with frequent Premature ventricular complexes Low voltage QRS Borderline ECG Confirmed by Quintella Reichert [phone]) on 12/13/2017 8:16:55 PM       Radiology Dg Chest 2 View  Result Date: 12/13/2017 CLINICAL DATA:  Central chest pain radiating posteriorly for 2 days, history of pneumonia, RIGHT breast cancer, hiatal hernia, hypertension EXAM: CHEST  2 VIEW COMPARISON:  09/22/2017 FINDINGS: Normal heart size, mediastinal contours, and pulmonary vascularity. Mild chronic bronchitic changes. No acute infiltrate, pleural effusion or pneumothorax. RIGHT breast prosthesis and surgical clips from axillary node dissection identified. Bones demineralized. IMPRESSION: Mild chronic bronchitic changes. No acute abnormalities. Electronically Signed   By: Lavonia Dana M.D.   On: 12/13/2017 16:50    Procedures Procedures (including critical care time)  Medications Ordered in ED Medications - No data to display   Initial Impression / Assessment and Plan / ED Course  I have reviewed the triage vital signs and the nursing notes.  Pertinent labs & imaging results that were available during my care of the patient were reviewed by me and considered in my medical decision making (see chart for details).     Patient here for episodes of palpitations as well as chest pain yesterday, now resolved.  In terms of her palpitations he has a long history of same, EKG with frequent PVCs  similar compared to priors.  No evidence of acute life-threatening arrhythmia.  In terms of her chest pain, this appears to be likely reflux related.  Presentation is not consistent with ACS, PE, dissection, pneumonia, CHF.  Plan to  discharge home with outpatient follow-up and return precautions.  Final Clinical Impressions(s) / ED Diagnoses   Final diagnoses:  Palpitations  Other chest pain    ED Discharge Orders    None       Quintella Reichert, MD 12/14/17 Laureen Abrahams

## 2017-12-13 NOTE — ED Triage Notes (Signed)
Pt presents to the ed for complaints of palpitations x 2 days. Endorses chest pain last night lasting 1 hour that was in the center of her chest to her back.  Pt is in no distress in triage.

## 2017-12-13 NOTE — ED Notes (Signed)
Patient able to ambulate independently  

## 2017-12-17 ENCOUNTER — Ambulatory Visit: Payer: Medicare Other | Admitting: Internal Medicine

## 2017-12-17 ENCOUNTER — Encounter: Payer: Self-pay | Admitting: Internal Medicine

## 2017-12-17 VITALS — BP 132/78 | HR 86 | Ht 61.5 in | Wt 161.0 lb

## 2017-12-17 DIAGNOSIS — I10 Essential (primary) hypertension: Secondary | ICD-10-CM

## 2017-12-17 DIAGNOSIS — R002 Palpitations: Secondary | ICD-10-CM

## 2017-12-17 DIAGNOSIS — I471 Supraventricular tachycardia: Secondary | ICD-10-CM

## 2017-12-17 NOTE — Progress Notes (Signed)
OFFICE NOTE  Chief Complaint:  Palpitations  Primary Care Physician: Biagio Borg, MD  HPI:  Leslie Bryant is a 77 y.o. female who is a former patient of Dr. Mare Ferrari. She is followed along with him for a number years and he also followed her husband who unfortunately died. She has no known coronary artery disease. She had heart catheterization 2002 which showed essentially normal coronaries. She's been followed for palpitations, history of PSVT which is been well controlled and dyslipidemia. Recently she was seen in the emergency department after working strenuously in the yard on a very hot day. She felt some fluttering in her chest and was noted to have some unifocal PVCs on a monitor. Recently her metoprolol was increased to 50 mg twice a day. She was advised to decrease caffeine intake. Since that time she's had no further recurrence and felt like it may be related to working outside in the hot weather. Blood pressure is well-controlled today. She has a history of intolerance to statins. Recently she had had some pain in her legs and thought it was related to study of. She was taken off that medicine and listed as an allergy however her symptoms did not change therefore she is interested in going back on the medicine. While she was on Zetia her LDL was 120 which is reasonable control.   12/24/2016  Leslie Bryant returns a for follow-up. Overall she seems to be doing well although suffering from some right knee pain. She told me that she intends to have right knee surgery hopefully in April with Dr. Sharol Given. She is asking for cardiovascular clearance. She denies any chest pain or worsening shortness of breath. Her blood pressure is well-controlled. She has been tolerant of ezetimibe 10 mg daily for cholesterol and recently her LDL was less than 80. She's previous he been intolerant of pravastatin, atorvastatin and rosuvastatin.  12/17/2017  Leslie Bryant is seen today in follow-up.  She was  recently in the emergency department beginning of February with some palpitations.  She also was describing some chest pain.  It was felt that this was more likely acid reflux.  Her PPI was doubled and her symptoms have improved.  She was noted to have PVCs, but this is known in her history.  She says her palpitations have improved and may be a lot related to stress.  We discussed possibility of using extra metoprolol or increasing her dose if necessary.  PMHx:  Past Medical History:  Diagnosis Date  . Acute meniscal tear of knee LEFT KNEE  . Arthritis    "knees" (02/19/2017)  . Breast cancer, right breast ([place]) 1986  . 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 Diverticulitis of large intestine with perforation 11/02/2011   Microperforation probably related to nut and popcorn consumption.  Resolved by CT scan Recurrent episode clinical dx 07/2012   . Diverticulosis of colon    sigmoid  . GERD (gastroesophageal reflux disease)   . H/O hiatal hernia   . History of kidney stones    "passed it" (02/19/2017)  . Hypercholesterolemia    history  . Left knee pain    occasionally  . Palpitations IRREGULAR HEARTBEAT--  CONTROLLED W/  BETA BLOCKER  . Pneumonia 2017   "walking pneumonia" (02/19/2017)  . Pre-diabetes    pcp is monitoring a1c currently 02/10/17  . Swelling of left knee joint     Past Surgical History:  Procedure Laterality Date  . BREAST  BIOPSY Right 1986  . BREAST BIOPSY Left   . BREAST RECONSTRUCTION Right 1987   W/ IMPLANTS  . CARDIAC CATHETERIZATION  12-22-2000  DR BRACKBILL   NORMAL LVF/ NORMAL CORONARY ARTERIES  . CHOLECYSTECTOMY OPEN  1988  . CHONDROPLASTY  09/08/2012   Procedure: CHONDROPLASTY;  Surgeon: Tobi Bastos, MD;  Location: [facility];  Service: Orthopedics;  Laterality: Left;  . COLONOSCOPY     last colon 05-03-2009 (02/19/2017)  . COLONOSCOPY W/ BIOPSIES AND POLYPECTOMY    . KNEE ARTHROSCOPY WITH MEDIAL MENISECTOMY  09/08/2012   Procedure: KNEE  ARTHROSCOPY WITH MEDIAL MENISECTOMY;  Surgeon: Tobi Bastos, MD;  Location: [place];  Service: Orthopedics;  Laterality: Left;  lateral tibial plateau,   . MASTECTOMY Right 1986   W/ MULTIPLE NODE DISSECTION  . REDUCTION MAMMAPLASTY Left 1987  . TOTAL KNEE ARTHROPLASTY Left 07/21/2013   Procedure: TOTAL KNEE ARTHROPLASTY;  Surgeon: Newt Minion, MD;  Location: [place];  Service: Orthopedics;  Laterality: Left;  Left Total Knee Arthroplasty  . TOTAL KNEE ARTHROPLASTY Right 02/19/2017  . TOTAL KNEE ARTHROPLASTY Right 02/19/2017   Procedure: RIGHT TOTAL KNEE ARTHROPLASTY;  Surgeon: Newt Minion, MD;  Location: [place];  Service: Orthopedics;  Laterality: Right;  Marland Kitchen VAGINAL HYSTERECTOMY  1966    FAMHx:  Family History  Problem Relation Age of Onset  . Heart attack Father   . Heart failure Sister   . Colon cancer Brother        died/age 63  . Lung cancer Sister   . Brain cancer Sister   . Cancer Brother        mouth  . Leukemia Brother     SOCHx:   reports that  has never smoked. she has never used smokeless tobacco. She reports that she drinks about 0.6 oz of alcohol per week. She reports that she does not use drugs.  ALLERGIES:  Allergies  Allergen Reactions  . Crestor [Rosuvastatin Calcium] Other (See Comments)    Shoulder pain  . Lipitor [Atorvastatin Calcium] Other (See Comments)    Myalgias   . Pravastatin Other (See Comments)    myalgias  . Penicillins Itching    Has patient had a PCN reaction causing immediate rash, facial/tongue/throat swelling, SOB or lightheadedness with hypotension: No Has patient had a PCN reaction causing severe rash involving mucus membranes or skin necrosis: No Has patient had a PCN reaction that required hospitalization No Has patient had a PCN reaction occurring within the last 10 years: No If all of the above answers are "NO", then may proceed with Cephalosporin use.     ROS: Pertinent items noted in HPI and remainder  of comprehensive ROS otherwise negative.  HOME MEDS: Current Outpatient Medications  Medication Sig Dispense Refill  . aspirin 81 MG tablet Take 81 mg by mouth 2 (two) times daily.    Marland Kitchen BIOTIN 5000 PO Take 5,000 mcg by mouth daily.     Marland Kitchen co-enzyme Q-10 30 MG capsule Take 30 mg by mouth 2 (two) times daily.     Marland Kitchen ezetimibe (ZETIA) 10 MG tablet TAKE 1 TABLET (10 MG TOTAL) BY MOUTH DAILY. 30 tablet 8  . fish oil-omega-3 fatty acids 1000 MG capsule Take 1 g by mouth daily.     Marland Kitchen ibuprofen (ADVIL,MOTRIN) 200 MG tablet Take 400 mg by mouth 2 (two) times daily as needed for moderate pain.    . metoprolol tartrate (LOPRESSOR) 50 MG tablet TAKE 1 TABLET BY  MOUTH TWICE A DAY 180 tablet 1  . Multiple Vitamins-Minerals (CENTRUM SILVER PO) Take 1 tablet by mouth every morning.     Marland Kitchen omeprazole (PRILOSEC) 40 MG capsule Take 40 mg by mouth 2 (two) times daily before a meal.     . Probiotic Product (ALIGN) 4 MG CAPS Take 4 mg by mouth at bedtime.      No current facility-administered medications for this visit.     LABS/IMAGING: No results found for this or any previous visit (from the past 48 hour(s)). No results found.  WEIGHTS: Wt Readings from Last 3 Encounters:  12/17/17 161 lb (73 kg)  12/13/17 150 lb (68 kg)  10/16/17 150 lb (68 kg)    VITALS: BP 132/78   Pulse 86   Ht 5' 1.5" (1.562 m)   Wt 161 lb (73 kg)   BMI 29.93 kg/m   EXAM: General appearance: alert and no distress Neck: no carotid bruit and no JVD Lungs: clear to auscultation bilaterally Heart: regular rate and rhythm and systolic murmur: early systolic 2/6, crescendo at 2nd right intercostal space Abdomen: soft, non-tender; bowel sounds normal; no masses,  no organomegaly Extremities: extremities normal, atraumatic, no cyanosis or edema Pulses: 2+ and symmetric Skin: Skin color, texture, turgor normal. No rashes or lesions Neurologic: Grossly normal Psych: Pleasant  EKG: Normal sinus rhythm at 86-personally  reviewed  ASSESSMENT: 1. Palpitations-history of PSVT and occasional PVCs 2. Dyslipidemia-intolerant to statins 3. Essential hypertension 4. GERD  PLAN: 1.   Leslie Bryant has a history of palpitations with PVCs and PSVT.  Generally they are well controlled on metoprolol however she is recently had some more.  This may be related to stress.  If necessary, there is room to increase her beta-blocker or take an extra beta-blocker as needed.  She is also had some reflux symptoms which was likely responsible for her recent chest discomfort.  She ruled out for MI in the emergency department.  No changes to her meds today.  Follow-up with me annually or sooner as necessary.  Pixie Casino, MD, [facility], [place] Director of the Advanced Lipid Disorders &  Cardiovascular Risk Reduction Clinic Diplomate of the American Board of Clinical Lipidology Attending Cardiologist  Direct Dial: [phone]  Fax: [phone]  Website:  [email or web address] 12/17/2017, 9:35 AM

## 2017-12-17 NOTE — Patient Instructions (Addendum)
Medication Instructions:  Your physician recommends that you continue on your current medications as directed. Please refer to the Current Medication list given to you today.  Labwork: NONE   Testing/Procedures: NONE   Follow-Up: Your physician wants you to follow-up in: 12 months with Dr Debara Pickett. You will receive a reminder letter in the mail two months in advance. If you don't receive a letter, please call our office to schedule the follow-up appointment.  Any Other Special Instructions Will Be Listed Below (If Applicable). If you need a refill on your cardiac medications before your next appointment, please call your pharmacy.

## 2018-01-09 ENCOUNTER — Other Ambulatory Visit: Payer: Self-pay | Admitting: Internal Medicine

## 2018-01-19 DIAGNOSIS — M9902 Segmental and somatic dysfunction of thoracic region: Secondary | ICD-10-CM | POA: Diagnosis not present

## 2018-01-19 DIAGNOSIS — M542 Cervicalgia: Secondary | ICD-10-CM | POA: Diagnosis not present

## 2018-01-19 DIAGNOSIS — M546 Pain in thoracic spine: Secondary | ICD-10-CM | POA: Diagnosis not present

## 2018-01-19 DIAGNOSIS — M9901 Segmental and somatic dysfunction of cervical region: Secondary | ICD-10-CM | POA: Diagnosis not present

## 2018-01-26 ENCOUNTER — Encounter: Payer: Medicare Other | Admitting: Internal Medicine

## 2018-02-03 ENCOUNTER — Other Ambulatory Visit: Payer: Self-pay | Admitting: Internal Medicine

## 2018-02-03 NOTE — Telephone Encounter (Signed)
Rx has been sent to the pharmacy electronically. ° °

## 2018-02-09 ENCOUNTER — Other Ambulatory Visit (INDEPENDENT_AMBULATORY_CARE_PROVIDER_SITE_OTHER): Payer: Medicare Other

## 2018-02-09 ENCOUNTER — Encounter: Payer: Self-pay | Admitting: Internal Medicine

## 2018-02-09 ENCOUNTER — Ambulatory Visit (INDEPENDENT_AMBULATORY_CARE_PROVIDER_SITE_OTHER): Payer: Medicare Other | Admitting: Internal Medicine

## 2018-02-09 VITALS — BP 126/78 | HR 95 | Temp 98.6°F | Ht 61.5 in | Wt 165.0 lb

## 2018-02-09 DIAGNOSIS — E119 Type 2 diabetes mellitus without complications: Secondary | ICD-10-CM

## 2018-02-09 DIAGNOSIS — Z0000 Encounter for general adult medical examination without abnormal findings: Secondary | ICD-10-CM | POA: Diagnosis not present

## 2018-02-09 DIAGNOSIS — I872 Venous insufficiency (chronic) (peripheral): Secondary | ICD-10-CM

## 2018-02-09 LAB — CBC WITH DIFFERENTIAL/PLATELET
BASOS PCT: 0.6 % (ref 0.0–3.0)
Basophils Absolute: 0.1 10*3/uL (ref 0.0–0.1)
EOS PCT: 4 % (ref 0.0–5.0)
Eosinophils Absolute: 0.4 10*3/uL (ref 0.0–0.7)
HCT: 35.8 % — ABNORMAL LOW (ref 36.0–46.0)
Hemoglobin: 12.1 g/dL (ref 12.0–15.0)
LYMPHS ABS: 3.1 10*3/uL (ref 0.7–4.0)
Lymphocytes Relative: 32.3 % (ref 12.0–46.0)
MCHC: 33.7 g/dL (ref 30.0–36.0)
MCV: 92 fl (ref 78.0–100.0)
MONO ABS: 1 10*3/uL (ref 0.1–1.0)
Monocytes Relative: 9.9 % (ref 3.0–12.0)
NEUTROS PCT: 53.2 % (ref 43.0–77.0)
Neutro Abs: 5.2 10*3/uL (ref 1.4–7.7)
Platelets: 333 10*3/uL (ref 150.0–400.0)
RBC: 3.89 Mil/uL (ref 3.87–5.11)
RDW: 12.6 % (ref 11.5–15.5)
WBC: 9.7 10*3/uL (ref 4.0–10.5)

## 2018-02-09 LAB — URINALYSIS, ROUTINE W REFLEX MICROSCOPIC
Bilirubin Urine: NEGATIVE
Hgb urine dipstick: NEGATIVE
KETONES UR: NEGATIVE
Leukocytes, UA: NEGATIVE
Nitrite: NEGATIVE
PH: 6 (ref 5.0–8.0)
RBC / HPF: NONE SEEN (ref 0–?)
TOTAL PROTEIN, URINE-UPE24: NEGATIVE
URINE GLUCOSE: NEGATIVE
UROBILINOGEN UA: 0.2 (ref 0.0–1.0)

## 2018-02-09 LAB — HEPATIC FUNCTION PANEL
ALBUMIN: 4.1 g/dL (ref 3.5–5.2)
ALT: 25 U/L (ref 0–35)
AST: 22 U/L (ref 0–37)
Alkaline Phosphatase: 73 U/L (ref 39–117)
BILIRUBIN TOTAL: 0.2 mg/dL (ref 0.2–1.2)
Bilirubin, Direct: 0 mg/dL (ref 0.0–0.3)
Total Protein: 7.9 g/dL (ref 6.0–8.3)

## 2018-02-09 LAB — MICROALBUMIN / CREATININE URINE RATIO
CREATININE, U: 19.4 mg/dL
MICROALB/CREAT RATIO: 3.6 mg/g (ref 0.0–30.0)
Microalb, Ur: <0.7 mg/dL (ref 0.0–1.9)

## 2018-02-09 LAB — BASIC METABOLIC PANEL
BUN: 20 mg/dL (ref 6–23)
CALCIUM: 9.5 mg/dL (ref 8.4–10.5)
CO2: 27 meq/L (ref 19–32)
Chloride: 100 mEq/L (ref 96–112)
Creatinine, Ser: 0.82 mg/dL (ref 0.40–1.20)
GFR: 71.74 mL/min (ref >60.00)
GLUCOSE: 134 mg/dL — AB (ref 70–99)
POTASSIUM: 4.1 meq/L (ref 3.5–5.1)
SODIUM: 134 meq/L — AB (ref 135–145)

## 2018-02-09 LAB — LIPID PANEL
Cholesterol: 163 mg/dL (ref 0–200)
HDL: 52.7 mg/dL (ref >39.00)
LDL Cholesterol: 85 mg/dL (ref 0–99)
NonHDL: 110.27
Total CHOL/HDL Ratio: 3
Triglycerides: 126 mg/dL (ref 0.0–149.0)
VLDL: 25.2 mg/dL (ref 0.0–40.0)

## 2018-02-09 LAB — TSH: TSH: 1.27 u[IU]/mL (ref 0.35–4.50)

## 2018-02-09 LAB — HEMOGLOBIN A1C: Hgb A1c MFr Bld: 6.8 % — ABNORMAL HIGH (ref 4.6–6.5)

## 2018-02-09 MED ORDER — TRIAMCINOLONE ACETONIDE 0.1 % EX CREA: 1.0000 "application " | TOPICAL_CREAM | Freq: Two times a day (BID) | CUTANEOUS | 0 refills | Status: DC

## 2018-02-09 MED ORDER — ZOSTER VAC RECOMB ADJUVANTED 50 MCG/0.5ML IM SUSR: 0.5000 mL | Freq: Once | INTRAMUSCULAR | 1 refills | Status: AC

## 2018-02-09 MED ORDER — BUMETANIDE 1 MG PO TABS: 1.0000 mg | ORAL_TABLET | Freq: Every day | ORAL | 1 refills | Status: DC | PRN

## 2018-02-09 NOTE — Patient Instructions (Signed)
Please take all new medication as prescribed - the steroid cream as needed  Please continue all other medications as before, and refills have been done if requested - the bumex fluid pill as needed  Please have the pharmacy call with any other refills you may need.  Please continue your efforts at being more active, low cholesterol diet, and weight control.  You are otherwise up to date with prevention measures today.  Please keep your appointments with your specialists as you may have planned  Please go to the LAB in the Basement (turn left off the elevator) for the tests to be done today  \You will be contacted by phone if any changes need to be made immediately.  Otherwise, you will receive a letter about your results with an explanation, but please check with MyChart first.  Please remember to sign up for MyChart if you have not done so, as this will be important to you in the future with finding out test results, communicating by private email, and scheduling acute appointments online when needed.  Please return in 6 months, or sooner if needed, with Lab testing done 3-5 days before

## 2018-02-09 NOTE — Assessment & Plan Note (Signed)

## 2018-02-09 NOTE — Assessment & Plan Note (Signed)
Lab Results  Component Value Date   HGBA1C 6.8 (H) 02/09/2018  stable overall by history and exam, recent data reviewed with pt, and pt to continue medical treatment as before,  to f/u any worsening symptoms or concerns'

## 2018-02-09 NOTE — Assessment & Plan Note (Signed)
Mild, for prn bumex as before,  to f/u any worsening symptoms or concerns

## 2018-02-09 NOTE — Progress Notes (Signed)
Subjective:    Patient ID: Leslie Bryant, female    DOB: 21-Nov-1940, 77 y.o.   MRN: [id number]  HPI  Here for wellness and f/u;  Overall doing ok;  Pt denies Chest pain, worsening SOB, DOE, wheezing, orthopnea, PND, worsening LE edema, palpitations, dizziness or syncope.  Pt denies neurological change such as new headache, facial or extremity weakness.  Pt denies polydipsia, polyuria, or low sugar symptoms. Pt states overall good compliance with treatment and medications, good tolerability, and has been trying to follow appropriate diet.  Pt denies worsening depressive symptoms, suicidal ideation or panic. No fever, night sweats, wt loss, loss of appetite, or other constitutional symptoms.  Pt states good ability with ADL's, has low fall risk, home safety reviewed and adequate, no other significant changes in hearing or vision, and only occasionally active with exercise.  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 C/o itchy area to anterior aspect right ankle, and recurring mild distal swelling tx previously with prn diuretic per Dr Sharyne Richters (now retired) - only takes very infrequently Past Medical History:  Diagnosis Date  . Acute meniscal tear of knee LEFT KNEE  . Arthritis    "knees" (02/19/2017)  . Breast cancer, right breast ([place]) 1986  . 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 Diverticulitis of large intestine with perforation 11/02/2011   Microperforation probably related to nut and popcorn consumption.  Resolved by CT scan Recurrent episode clinical dx 07/2012   . Diverticulosis of colon    sigmoid  . GERD (gastroesophageal reflux disease)   . H/O hiatal hernia   . History of kidney stones    "passed it" (02/19/2017)  . Hypercholesterolemia    history  . Left knee pain    occasionally  . Palpitations IRREGULAR HEARTBEAT--  CONTROLLED W/  BETA BLOCKER  . Pneumonia 2017   "walking pneumonia" (02/19/2017)  . Pre-diabetes    pcp is monitoring a1c currently 02/10/17  .  Swelling of left knee joint    Past Surgical History:  Procedure Laterality Date  . BREAST BIOPSY Right 1986  . BREAST BIOPSY Left   . BREAST RECONSTRUCTION Right 1987   W/ IMPLANTS  . CARDIAC CATHETERIZATION  12-22-2000  DR BRACKBILL   NORMAL LVF/ NORMAL CORONARY ARTERIES  . CHOLECYSTECTOMY OPEN  1988  . CHONDROPLASTY  09/08/2012   Procedure: CHONDROPLASTY;  Surgeon: Tobi Bastos, MD;  Location: [facility];  Service: Orthopedics;  Laterality: Left;  . COLONOSCOPY     last colon 05-03-2009 (02/19/2017)  . COLONOSCOPY W/ BIOPSIES AND POLYPECTOMY    . KNEE ARTHROSCOPY WITH MEDIAL MENISECTOMY  09/08/2012   Procedure: KNEE ARTHROSCOPY WITH MEDIAL MENISECTOMY;  Surgeon: Tobi Bastos, MD;  Location: [place];  Service: Orthopedics;  Laterality: Left;  lateral tibial plateau,   . MASTECTOMY Right 1986   W/ MULTIPLE NODE DISSECTION  . REDUCTION MAMMAPLASTY Left 1987  . TOTAL KNEE ARTHROPLASTY Left 07/21/2013   Procedure: TOTAL KNEE ARTHROPLASTY;  Surgeon: Newt Minion, MD;  Location: [place];  Service: Orthopedics;  Laterality: Left;  Left Total Knee Arthroplasty  . TOTAL KNEE ARTHROPLASTY Right 02/19/2017  . TOTAL KNEE ARTHROPLASTY Right 02/19/2017   Procedure: RIGHT TOTAL KNEE ARTHROPLASTY;  Surgeon: Newt Minion, MD;  Location: [place];  Service: Orthopedics;  Laterality: Right;  Marland Kitchen VAGINAL HYSTERECTOMY  1966    reports that she has never smoked. She has never used smokeless tobacco. She reports that she  drinks about 0.6 oz of alcohol per week. She reports that she does not use drugs. family history includes Brain cancer in her sister; Cancer in her brother; Colon cancer in her brother; Heart attack in her father; Heart failure in her sister; Leukemia in her brother; Lung cancer in her sister. Allergies  Allergen Reactions  . Crestor [Rosuvastatin Calcium] Other (See Comments)    Shoulder pain  . Lipitor [Atorvastatin Calcium] Other (See Comments)     Myalgias   . Pravastatin Other (See Comments)    myalgias  . Penicillins Itching    Has patient had a PCN reaction causing immediate rash, facial/tongue/throat swelling, SOB or lightheadedness with hypotension: No Has patient had a PCN reaction causing severe rash involving mucus membranes or skin necrosis: No Has patient had a PCN reaction that required hospitalization No Has patient had a PCN reaction occurring within the last 10 years: No If all of the above answers are "NO", then may proceed with Cephalosporin use.    Current Outpatient Medications on File Prior to Visit  Medication Sig Dispense Refill  . aspirin 81 MG tablet Take 81 mg by mouth 2 (two) times daily.    Marland Kitchen BIOTIN 5000 PO Take 5,000 mcg by mouth daily.     Marland Kitchen co-enzyme Q-10 30 MG capsule Take 30 mg by mouth 2 (two) times daily.     Marland Kitchen ezetimibe (ZETIA) 10 MG tablet TAKE 1 TABLET (10 MG TOTAL) BY MOUTH DAILY. 90 tablet 3  . fish oil-omega-3 fatty acids 1000 MG capsule Take 1 g by mouth daily.     Marland Kitchen ibuprofen (ADVIL,MOTRIN) 200 MG tablet Take 400 mg by mouth 2 (two) times daily as needed for moderate pain.    . metoprolol tartrate (LOPRESSOR) 50 MG tablet Take 1 tablet (50 mg total) by mouth 2 (two) times daily. **PT NEEDS AN APPOINTMENT FOR ADDITIONAL REFILLS** 180 tablet 0  . Multiple Vitamins-Minerals (CENTRUM SILVER PO) Take 1 tablet by mouth every morning.     Marland Kitchen omeprazole (PRILOSEC) 40 MG capsule Take 40 mg by mouth 2 (two) times daily before a meal.      No current facility-administered medications on file prior to visit.    Review of Systems Constitutional: Negative for other unusual diaphoresis, sweats, appetite or weight changes HENT: Negative for other worsening hearing loss, ear pain, facial swelling, mouth sores or neck stiffness.   Eyes: Negative for other worsening pain, redness or other visual disturbance.  Respiratory: Negative for other stridor or swelling Cardiovascular: Negative for other palpitations  or other chest pain  Gastrointestinal: Negative for worsening diarrhea or loose stools, blood in stool, distention or other pain Genitourinary: Negative for hematuria, flank pain or other change in urine volume.  Musculoskeletal: Negative for myalgias or other joint swelling.  Skin: Negative for other color change, or other wound or worsening drainage.  Neurological: Negative for other syncope or numbness. Hematological: Negative for other adenopathy or swelling Psychiatric/Behavioral: Negative for hallucinations, other worsening agitation, SI, self-injury, or new decreased concentration All other system neg per pt    Objective:   Physical Exam BP 126/78   Pulse 95   Temp 98.6 F (37 C) (Oral)   Ht 5' 1.5" (1.562 m)   Wt 165 lb (74.8 kg)   SpO2 96%   BMI 30.67 kg/m  VS noted,  Constitutional: Pt is oriented to person, place, and time. Appears well-developed and well-nourished, in no significant distress and comfortable Head: Normocephalic and atraumatic  Eyes: Conjunctivae and  EOM are normal. Pupils are equal, round, and reactive to light Right Ear: External ear normal without discharge Left Ear: External ear normal without discharge Nose: Nose without discharge or deformity Mouth/Throat: Oropharynx is without other ulcerations and moist  Neck: Normal range of motion. Neck supple. No JVD present. No tracheal deviation present or significant neck LA or mass Cardiovascular: Normal rate, regular rhythm, normal heart sounds and intact distal pulses.   Pulmonary/Chest: WOB normal and breath sounds without rales or wheezing  Abdominal: Soft. Bowel sounds are normal. NT. No HSM  Musculoskeletal: Normal range of motion. Exhibits trace bilat ankle edema Lymphadenopathy: Has no other cervical adenopathy.  Neurological: Pt is alert and oriented to person, place, and time. Pt has normal reflexes. No cranial nerve deficit. Motor grossly intact, Gait intact Skin: Skin is warm and dry. No rash  noted or new ulcerations Psychiatric:  Has normal mood and affect. Behavior is normal without agitation No other exam findings Lab Results  Component Value Date   WBC 9.7 02/09/2018   HGB 12.1 02/09/2018   HCT 35.8 (L) 02/09/2018   PLT 333.0 02/09/2018   GLUCOSE 134 (H) 02/09/2018   CHOL 163 02/09/2018   TRIG 126.0 02/09/2018   HDL 52.70 02/09/2018   LDLDIRECT 111.9 07/13/2014   LDLCALC 85 02/09/2018   ALT 25 02/09/2018   AST 22 02/09/2018   NA 134 (L) 02/09/2018   K 4.1 02/09/2018   CL 100 02/09/2018   CREATININE 0.82 02/09/2018   BUN 20 02/09/2018   CO2 27 02/09/2018   TSH 1.27 02/09/2018   INR 1.01 07/13/2013   HGBA1C 6.8 (H) 02/09/2018   MICROALBUR <0.7 02/09/2018         Assessment & Plan:

## 2018-02-19 ENCOUNTER — Other Ambulatory Visit: Payer: Self-pay | Admitting: Internal Medicine

## 2018-02-19 NOTE — Telephone Encounter (Signed)
prilosec refill deferred to PCP

## 2018-02-23 DIAGNOSIS — H43813 Vitreous degeneration, bilateral: Secondary | ICD-10-CM | POA: Diagnosis not present

## 2018-02-23 DIAGNOSIS — H2513 Age-related nuclear cataract, bilateral: Secondary | ICD-10-CM | POA: Diagnosis not present

## 2018-02-23 DIAGNOSIS — H11442 Conjunctival cysts, left eye: Secondary | ICD-10-CM | POA: Diagnosis not present

## 2018-02-23 LAB — HM DIABETES EYE EXAM

## 2018-02-26 ENCOUNTER — Telehealth: Payer: Self-pay | Admitting: Internal Medicine

## 2018-02-26 MED ORDER — OMEPRAZOLE 40 MG PO CPDR: 40.0000 mg | DELAYED_RELEASE_CAPSULE | Freq: Two times a day (BID) | ORAL | 3 refills | Status: DC

## 2018-02-26 NOTE — Telephone Encounter (Signed)
°*  STAT* If patient is at the pharmacy, call can be transferred to refill team.   1. Which medications need to be refilled? (please list name of each medication and dose if known) Omeprazole-pt says she been taking this for years-pharmacist said  That you would not okay it  2. Which pharmacy/location (including street and city if local pharmacy) is medication to be sent to?[facility] [phone]  3. Do they need a 30 day or 90 day supply? 90 and refills

## 2018-04-03 ENCOUNTER — Other Ambulatory Visit: Payer: Self-pay | Admitting: Internal Medicine

## 2018-04-09 ENCOUNTER — Telehealth (INDEPENDENT_AMBULATORY_CARE_PROVIDER_SITE_OTHER): Payer: Self-pay | Admitting: Orthopedic Surgery

## 2018-04-09 NOTE — Telephone Encounter (Signed)
Patient called needing an antibiotic sent over to her pharmacy so she can get her teeth cleaned. CB # [phone]

## 2018-04-10 ENCOUNTER — Other Ambulatory Visit (INDEPENDENT_AMBULATORY_CARE_PROVIDER_SITE_OTHER): Payer: Self-pay

## 2018-04-10 NOTE — Telephone Encounter (Signed)
Called pt and advised that her total knee was last April and she does not need the ABX coverage. She asked for this to be mailed to her. Information states that no coverage is needed unless required by dentist in which keflex 500 mg #4 1 po I hr prior to procedure and then 1 q 8 hrs after procedure x 3. This was mailed per her request today.

## 2018-04-16 NOTE — Telephone Encounter (Signed)
Patient said she never received the letter in the mail, so if you could please fax to the dentist office of Dr. Theda Belfast fax # [phone]

## 2018-04-16 NOTE — Telephone Encounter (Signed)
Faxed as requested

## 2018-05-16 ENCOUNTER — Other Ambulatory Visit: Payer: Self-pay | Admitting: Internal Medicine

## 2018-06-15 ENCOUNTER — Other Ambulatory Visit: Payer: Self-pay | Admitting: Internal Medicine

## 2018-07-20 ENCOUNTER — Encounter: Payer: Self-pay | Admitting: Internal Medicine

## 2018-07-20 ENCOUNTER — Ambulatory Visit: Payer: Self-pay | Admitting: *Deleted

## 2018-07-20 ENCOUNTER — Ambulatory Visit: Payer: Medicare Other | Admitting: Internal Medicine

## 2018-07-20 ENCOUNTER — Other Ambulatory Visit (INDEPENDENT_AMBULATORY_CARE_PROVIDER_SITE_OTHER): Payer: Medicare Other

## 2018-07-20 VITALS — BP 124/78 | HR 88 | Temp 98.9°F | Ht 61.5 in | Wt 162.0 lb

## 2018-07-20 DIAGNOSIS — I10 Essential (primary) hypertension: Secondary | ICD-10-CM | POA: Diagnosis not present

## 2018-07-20 DIAGNOSIS — R1032 Left lower quadrant pain: Secondary | ICD-10-CM

## 2018-07-20 DIAGNOSIS — Z23 Encounter for immunization: Secondary | ICD-10-CM

## 2018-07-20 DIAGNOSIS — E119 Type 2 diabetes mellitus without complications: Secondary | ICD-10-CM | POA: Diagnosis not present

## 2018-07-20 LAB — CBC WITH DIFFERENTIAL/PLATELET
Basophils Absolute: 0.1 10*3/uL (ref 0.0–0.1)
Basophils Relative: 0.5 % (ref 0.0–3.0)
EOS ABS: 0.1 10*3/uL (ref 0.0–0.7)
Eosinophils Relative: 1.1 % (ref 0.0–5.0)
HEMATOCRIT: 36.1 % (ref 36.0–46.0)
Hemoglobin: 12.4 g/dL (ref 12.0–15.0)
LYMPHS ABS: 2.1 10*3/uL (ref 0.7–4.0)
LYMPHS PCT: 18.3 % (ref 12.0–46.0)
MCHC: 34.3 g/dL (ref 30.0–36.0)
MCV: 90.8 fl (ref 78.0–100.0)
MONOS PCT: 8.1 % (ref 3.0–12.0)
Monocytes Absolute: 0.9 10*3/uL (ref 0.1–1.0)
NEUTROS PCT: 72 % (ref 43.0–77.0)
Neutro Abs: 8.4 10*3/uL — ABNORMAL HIGH (ref 1.4–7.7)
PLATELETS: 315 10*3/uL (ref 150.0–400.0)
RBC: 3.98 Mil/uL (ref 3.87–5.11)
RDW: 12.6 % (ref 11.5–15.5)
WBC: 11.7 10*3/uL — ABNORMAL HIGH (ref 4.0–10.5)

## 2018-07-20 LAB — HEPATIC FUNCTION PANEL
ALK PHOS: 70 U/L (ref 39–117)
ALT: 31 U/L (ref 0–35)
AST: 25 U/L (ref 0–37)
Albumin: 4.3 g/dL (ref 3.5–5.2)
BILIRUBIN DIRECT: 0.1 mg/dL (ref 0.0–0.3)
BILIRUBIN TOTAL: 0.5 mg/dL (ref 0.2–1.2)
Total Protein: 8 g/dL (ref 6.0–8.3)

## 2018-07-20 LAB — URINALYSIS, ROUTINE W REFLEX MICROSCOPIC
BILIRUBIN URINE: NEGATIVE
HGB URINE DIPSTICK: NEGATIVE
Ketones, ur: NEGATIVE
Leukocytes, UA: NEGATIVE
NITRITE: NEGATIVE
RBC / HPF: NONE SEEN (ref 0–?)
Specific Gravity, Urine: <=1.005 — AB (ref 1.000–1.030)
Total Protein, Urine: NEGATIVE
Urine Glucose: NEGATIVE
Urobilinogen, UA: 0.2 (ref 0.0–1.0)
pH: 6 (ref 5.0–8.0)

## 2018-07-20 LAB — BASIC METABOLIC PANEL
BUN: 10 mg/dL (ref 6–23)
CALCIUM: 9.7 mg/dL (ref 8.4–10.5)
CO2: 29 mEq/L (ref 19–32)
CREATININE: 0.78 mg/dL (ref 0.40–1.20)
Chloride: 97 mEq/L (ref 96–112)
GFR: 75.91 mL/min (ref >60.00)
Glucose, Bld: 113 mg/dL — ABNORMAL HIGH (ref 70–99)
Potassium: 4.2 mEq/L (ref 3.5–5.1)
SODIUM: 132 meq/L — AB (ref 135–145)

## 2018-07-20 LAB — LIPASE: LIPASE: 22 U/L (ref 11.0–59.0)

## 2018-07-20 MED ORDER — CIPROFLOXACIN HCL 500 MG PO TABS: 500.0000 mg | ORAL_TABLET | Freq: Two times a day (BID) | ORAL | 0 refills | Status: AC

## 2018-07-20 MED ORDER — METRONIDAZOLE 250 MG PO TABS: 250.0000 mg | ORAL_TABLET | Freq: Three times a day (TID) | ORAL | 0 refills | Status: AC

## 2018-07-20 NOTE — Progress Notes (Signed)
Subjective:    Patient ID: Leslie Bryant, female    DOB: Sep 10, 1941, 77 y.o.   MRN: [id number]  HPI  Here with acute onset Lower abd pain to both sides but more midline and left x 3 days, mod to severe, with some bloating and difficult for BM ut did have last BM today without blood, Denies fever, n/v, but has some reduced appetite, feels ill, and similar as to prior acute diverticulitis 8676 complicated by need for cather drainage of abscess. Denies urinary symptoms such as dysuria, frequency, urgency, flank pain, hematuria or n/v, fever, chills. Pt denies chest pain, increased sob or doe, wheezing, orthopnea, PND, increased LE swelling, palpitations, dizziness or syncope. Past Medical History:  Diagnosis Date  . Acute meniscal tear of knee LEFT KNEE  . Arthritis    "knees" (02/19/2017)  . Breast cancer, right breast ([place]) 1986  . 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 Diverticulitis of large intestine with perforation 11/02/2011   Microperforation probably related to nut and popcorn consumption.  Resolved by CT scan Recurrent episode clinical dx 07/2012   . Diverticulosis of colon    sigmoid  . GERD (gastroesophageal reflux disease)   . H/O hiatal hernia   . History of kidney stones    "passed it" (02/19/2017)  . Hypercholesterolemia    history  . Left knee pain    occasionally  . Palpitations IRREGULAR HEARTBEAT--  CONTROLLED W/  BETA BLOCKER  . Pneumonia 2017   "walking pneumonia" (02/19/2017)  . Pre-diabetes    pcp is monitoring a1c currently 02/10/17  . Swelling of left knee joint    Past Surgical History:  Procedure Laterality Date  . BREAST BIOPSY Right 1986  . BREAST BIOPSY Left   . BREAST RECONSTRUCTION Right 1987   W/ IMPLANTS  . CARDIAC CATHETERIZATION  12-22-2000  DR BRACKBILL   NORMAL LVF/ NORMAL CORONARY ARTERIES  . CHOLECYSTECTOMY OPEN  1988  . CHONDROPLASTY  09/08/2012   Procedure: CHONDROPLASTY;  Surgeon: Tobi Bastos, MD;  Location[facility];   Service: Orthopedics;  Laterality: Left;  . COLONOSCOPY     last colon 05-03-2009 (02/19/2017)  . COLONOSCOPY W/ BIOPSIES AND POLYPECTOMY    . KNEE ARTHROSCOPY WITH MEDIAL MENISECTOMY  09/08/2012   Procedure: KNEE ARTHROSCOPY WITH MEDIAL MENISECTOMY;  Surgeon: Tobi Bastos, MD;  Location: [place];  Service: Orthopedics;  Laterality: Left;  lateral tibial plateau,   . MASTECTOMY Right 1986   W/ MULTIPLE NODE DISSECTION  . REDUCTION MAMMAPLASTY Left 1987  . TOTAL KNEE ARTHROPLASTY Left 07/21/2013   Procedure: TOTAL KNEE ARTHROPLASTY;  Surgeon: Newt Minion, MD;  Location: [place];  Service: Orthopedics;  Laterality: Left;  Left Total Knee Arthroplasty  . TOTAL KNEE ARTHROPLASTY Right 02/19/2017  . TOTAL KNEE ARTHROPLASTY Right 02/19/2017   Procedure: RIGHT TOTAL KNEE ARTHROPLASTY;  Surgeon: Newt Minion, MD;  Location: [place];  Service: Orthopedics;  Laterality: Right;  Marland Kitchen VAGINAL HYSTERECTOMY  1966    reports that she has never smoked. She has never used smokeless tobacco. She reports that she drinks about 1.0 standard drinks of alcohol per week. She reports that she does not use drugs. family history includes Brain cancer in her sister; Cancer in her brother; Colon cancer in her brother; Heart attack in her father; Heart failure in her sister; Leukemia in her brother; Lung cancer in her sister. Allergies  Allergen Reactions  . Crestor [Rosuvastatin Calcium] Other (See  Comments)    Shoulder pain  . Lipitor [Atorvastatin Calcium] Other (See Comments)    Myalgias   . Pravastatin Other (See Comments)    myalgias  . Penicillins Itching    Has patient had a PCN reaction causing immediate rash, facial/tongue/throat swelling, SOB or lightheadedness with hypotension: No Has patient had a PCN reaction causing severe rash involving mucus membranes or skin necrosis: No Has patient had a PCN reaction that required hospitalization No Has patient had a PCN reaction occurring within  the last 10 years: No If all of the above answers are "NO", then may proceed with Cephalosporin use.    Current Outpatient Medications on File Prior to Visit  Medication Sig Dispense Refill  . aspirin 81 MG tablet Take 81 mg by mouth 2 (two) times daily.    Marland Kitchen BIOTIN 5000 PO Take 5,000 mcg by mouth daily.     . bumetanide (BUMEX) 1 MG tablet TAKE 1 TABLET (1 MG TOTAL) BY MOUTH DAILY AS NEEDED 90 tablet 1  . co-enzyme Q-10 30 MG capsule Take 30 mg by mouth 2 (two) times daily.     Marland Kitchen ezetimibe (ZETIA) 10 MG tablet TAKE 1 TABLET (10 MG TOTAL) BY MOUTH DAILY. 90 tablet 3  . fish oil-omega-3 fatty acids 1000 MG capsule Take 1 g by mouth daily.     Marland Kitchen ibuprofen (ADVIL,MOTRIN) 200 MG tablet Take 400 mg by mouth 2 (two) times daily as needed for moderate pain.    . metoprolol tartrate (LOPRESSOR) 50 MG tablet Take 1 tablet (50 mg total) by mouth 2 (two) times daily. 180 tablet 3  . Multiple Vitamins-Minerals (CENTRUM SILVER PO) Take 1 tablet by mouth every morning.     Marland Kitchen omeprazole (PRILOSEC) 40 MG capsule Take 1 capsule (40 mg total) by mouth 2 (two) times daily before a meal. 180 capsule 3  . triamcinolone cream (KENALOG) 0.1 % APPLY TO AFFECTED AREA TWICE A DAY 30 g 0   No current facility-administered medications on file prior to visit.     Review of Systems  Constitutional: Negative for other unusual diaphoresis or sweats HENT: Negative for ear discharge or swelling Eyes: Negative for other worsening visual disturbances Respiratory: Negative for stridor or other swelling  Gastrointestinal: Negative for worsening distension or other blood Genitourinary: Negative for retention or other urinary change Musculoskeletal: Negative for other MSK pain or swelling Skin: Negative for color change or other new lesions Neurological: Negative for worsening tremors and other numbness  Psychiatric/Behavioral: Negative for worsening agitation or other fatigue All other system neg per pt    Objective:    Physical Exam BP 124/78   Pulse 88   Temp 98.9 F (37.2 C) (Oral)   Ht 5' 1.5" (1.562 m)   Wt 162 lb (73.5 kg)   SpO2 96%   BMI 30.11 kg/m  VS noted, mild ill appearing Constitutional: Pt appears in NAD HENT: Head: NCAT.  Right Ear: External ear normal.  Left Ear: External ear normal.  Eyes: . Pupils are equal, round, and reactive to light. Conjunctivae and EOM are normal Nose: without d/c or deformity Neck: Neck supple. Gross normal ROM Cardiovascular: Normal rate and regular rhythm.   Pulmonary/Chest: Effort normal and breath sounds without rales or wheezing.  Abd:  Soft, ND, + BS, no organomegaly with tender mostly at LLQ, milder to midline, no guarding or rebound Neurological: Pt is alert. At baseline orientation, motor grossly intact Skin: Skin is warm. No rashes, other new lesions, no LE  edema Psychiatric: Pt behavior is normal without agitation  No other exam findings Lab Results  Component Value Date   WBC 9.7 02/09/2018   HGB 12.1 02/09/2018   HCT 35.8 (L) 02/09/2018   PLT 333.0 02/09/2018   GLUCOSE 134 (H) 02/09/2018   CHOL 163 02/09/2018   TRIG 126.0 02/09/2018   HDL 52.70 02/09/2018   LDLDIRECT 111.9 07/13/2014   LDLCALC 85 02/09/2018   ALT 25 02/09/2018   AST 22 02/09/2018   NA 134 (L) 02/09/2018   K 4.1 02/09/2018   CL 100 02/09/2018   CREATININE 0.82 02/09/2018   BUN 20 02/09/2018   CO2 27 02/09/2018   TSH 1.27 02/09/2018   INR 1.01 07/13/2013   HGBA1C 6.8 (H) 02/09/2018   MICROALBUR <0.7 02/09/2018       Assessment & Plan:

## 2018-07-20 NOTE — Telephone Encounter (Signed)
FYI

## 2018-07-20 NOTE — Telephone Encounter (Signed)
Pt called with complaints of lower abdominal pain that started 07/17/18; she says that it is "getting worse"; she says that she had diarrhea on 07/17/18 and 07/18/18; she says that she feels bloat and when she presses on her lower abdomen it hurts; recommendations made per nurse triage protocol to include seeing a physician within 24 hours; pt offered and accepted an appointment with Dr [name], [facility], 07/20/18 at 1100; she verbalizes understanding; will route to office for notification of this upcoming appointment.  Reason for Disposition . [1] MODERATE pain (e.g., interferes with normal activities) AND [2] pain comes and goes (cramps) AND [3] present > 24 hours  (Exception: pain with Vomiting or Diarrhea - see that Guideline)  Answer Assessment - Initial Assessment Questions 1. LOCATION: "Where does it hurt?"      Lower abdomen; center of stomach 2. RADIATION: "Does the pain shoot anywhere else?" (e.g., chest, back)     no 3. ONSET: "When did the pain begin?" (e.g., minutes, hours or days ago)      07/17/18 4. SUDDEN: "Gradual or sudden onset?"     sudden 5. PATTERN "Does the pain come and go, or is it constant?"    - If constant: "Is it getting better, staying the same, or worsening?"      (Note: Constant means the pain never goes away completely; most serious pain is constant and it progresses)     - If intermittent: "How long does it last?" "Do you have pain now?"     (Note: Intermittent means the pain goes away completely between bouts)     Comes and goes 6. SEVERITY: "How bad is the pain?"  (e.g., Scale 1-10; mild, moderate, or severe)   - MILD (1-3): doesn't interfere with normal activities, abdomen soft and not tender to touch    - MODERATE (4-7): interferes with normal activities or awakens from sleep, tender to touch    - SEVERE (8-10): excruciating pain, doubled over, unable to do any normal activities      Moderate to severe 7. RECURRENT SYMPTOM: "Have you ever had this type of  abdominal pain before?" If so, ask: "When was the last time?" and "What happened that time?"      Yes, previously diagnosed with diverticulitis (pain was worse then than it is now) 8. CAUSE: "What do you think is causing the abdominal pain?"     Not sure 9. RELIEVING/AGGRAVATING FACTORS: "What makes it better or worse?" (e.g., movement, antacids, bowel movement)     no 10. OTHER SYMPTOMS: "Has there been any vomiting, diarrhea, constipation, or urine problems?"       diarrhea 11. PREGNANCY: "Is there any chance you are pregnant?" "When was your last menstrual period?"       no  Protocols used: ABDOMINAL PAIN - [facility]

## 2018-07-20 NOTE — Assessment & Plan Note (Signed)
stable overall by history and exam, recent data reviewed with pt, and pt to continue medical treatment as before,  to f/u any worsening symptoms or concerns  

## 2018-07-20 NOTE — Patient Instructions (Signed)
Please take all new medication as prescribed - the 2 antibiotics  Please continue all other medications as before, and refills have been done if requested.  Please have the pharmacy call with any other refills you may need.  Please keep your appointments with your specialists as you may have planned  Please go to the LAB in the Basement (turn left off the elevator) for the tests to be done today  You will be contacted by phone if any changes need to be made immediately.  Otherwise, you will receive a letter about your results with an explanation, but please check with MyChart first.  Please remember to sign up for MyChart if you have not done so, as this will be important to you in the future with finding out test results, communicating by private email, and scheduling acute appointments online when needed.  Please call in 2-3 days if not improved to consider CT scanning

## 2018-07-20 NOTE — Assessment & Plan Note (Addendum)
High suspicion for recurrent episode of acute diverticultiis; pt would like to avoid CT for now, ok for empiric tx cipro and flagyl asd, also labs today, consider CT if not improving in 2-3 days; to ED for any acute worsening

## 2018-07-21 LAB — URINE CULTURE
MICRO NUMBER:: 91107528
RESULT: NO GROWTH
SPECIMEN QUALITY:: ADEQUATE

## 2018-08-17 NOTE — Progress Notes (Addendum)
Subjective:   Leslie Bryant is a 77 y.o. female who presents for an Initial Medicare Annual Wellness Visit.  Review of Systems    No ROS.  Medicare Wellness Visit. Additional risk factors are reflected in the social history.  Cardiac Risk Factors include: advanced age (>81men, >61 women);dyslipidemia Sleep patterns: feels rested on waking, gets up 3-4 times nightly to void and sleeps 7-8 hours nightly.    Home Safety/Smoke Alarms: Feels safe in home. Smoke alarms in place.  Living environment; residence and Firearm Safety: [place], no firearms Lives with husband, no needs for DME, good support system. Seat Belt Safety/Bike Helmet: Wears seat belt.      Objective:    Today's Vitals   08/18/18 0820  BP: 138/66  Pulse: 76  Resp: 18  Temp: 98.1 F (36.7 C)  SpO2: 96%  Weight: 162 lb (73.5 kg)  Height: 5\' 2"  (1.575 m)   Body mass index is 29.63 kg/m.  Advanced Directives 08/18/2018 12/13/2017 03/12/2017 02/19/2017 02/10/2017 05/25/2016 08/04/2014  Does Patient Have a Medical Advance Directive? Yes No Yes Yes Yes No Yes  Type of Paramedic of [place];Living will - Healthcare Power of [place];Living will [place];Living will - [place];Living will  Does patient want to make changes to medical advance directive? - - - No - Patient declined - - -  Copy of [place] in Chart? No - copy requested - No - copy requested No - copy requested No - copy requested - -  Would patient like information on creating a medical advance directive? - - - - - No - patient declined information -  Pre-existing out of facility DNR order (yellow form or pink MOST form) - - - - - - -    Current Medications (verified) Outpatient Encounter Medications as of 08/18/2018  Medication Sig  . aspirin 81 MG tablet Take 81 mg by mouth 2 (two) times daily.  Marland Kitchen BIOTIN 5000 PO Take 5,000 mcg by mouth  daily.   . bumetanide (BUMEX) 1 MG tablet TAKE 1 TABLET (1 MG TOTAL) BY MOUTH DAILY AS NEEDED  . co-enzyme Q-10 30 MG capsule Take 30 mg by mouth 2 (two) times daily.   Marland Kitchen ezetimibe (ZETIA) 10 MG tablet TAKE 1 TABLET (10 MG TOTAL) BY MOUTH DAILY.  . fish oil-omega-3 fatty acids 1000 MG capsule Take 1 g by mouth daily.   Marland Kitchen ibuprofen (ADVIL,MOTRIN) 200 MG tablet Take 400 mg by mouth 2 (two) times daily as needed for moderate pain.  . metoprolol tartrate (LOPRESSOR) 50 MG tablet Take 1 tablet (50 mg total) by mouth 2 (two) times daily.  . Multiple Vitamins-Minerals (CENTRUM SILVER PO) Take 1 tablet by mouth every morning.   Marland Kitchen omeprazole (PRILOSEC) 40 MG capsule Take 1 capsule (40 mg total) by mouth 2 (two) times daily before a meal.  . triamcinolone cream (KENALOG) 0.1 % APPLY TO AFFECTED AREA TWICE A DAY   No facility-administered encounter medications on file as of 08/18/2018.     Allergies (verified) Crestor [rosuvastatin calcium]; Lipitor [atorvastatin calcium]; Pravastatin; and Penicillins   History: Past Medical History:  Diagnosis Date  . Acute meniscal tear of knee LEFT KNEE  . Arthritis    "knees" (02/19/2017)  . Breast cancer, right breast ([place]) 1986  . 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 Diverticulitis of large intestine with perforation 11/02/2011   Microperforation probably related  to nut and popcorn consumption.  Resolved by CT scan Recurrent episode clinical dx 07/2012   . Diverticulosis of colon    sigmoid  . GERD (gastroesophageal reflux disease)   . H/O hiatal hernia   . History of kidney stones    "passed it" (02/19/2017)  . Hypercholesterolemia    history  . Left knee pain    occasionally  . Palpitations IRREGULAR HEARTBEAT--  CONTROLLED W/  BETA BLOCKER  . Pneumonia 2017   "walking pneumonia" (02/19/2017)  . Pre-diabetes    pcp is monitoring a1c currently 02/10/17  . Swelling of left knee joint    Past Surgical History:  Procedure Laterality Date  . BREAST  BIOPSY Right 1986  . BREAST BIOPSY Left   . BREAST RECONSTRUCTION Right 1987   W/ IMPLANTS  . CARDIAC CATHETERIZATION  12-22-2000  DR BRACKBILL   NORMAL LVF/ NORMAL CORONARY ARTERIES  . CHOLECYSTECTOMY OPEN  1988  . CHONDROPLASTY  09/08/2012   Procedure: CHONDROPLASTY;  Surgeon: Tobi Bastos, MD;  Location: [facility];  Service: Orthopedics;  Laterality: Left;  . COLONOSCOPY     last colon 05-03-2009 (02/19/2017)  . COLONOSCOPY W/ BIOPSIES AND POLYPECTOMY    . KNEE ARTHROSCOPY WITH MEDIAL MENISECTOMY  09/08/2012   Procedure: KNEE ARTHROSCOPY WITH MEDIAL MENISECTOMY;  Surgeon: Tobi Bastos, MD;  Location: [place];  Service: Orthopedics;  Laterality: Left;  lateral tibial plateau,   . MASTECTOMY Right 1986   W/ MULTIPLE NODE DISSECTION  . REDUCTION MAMMAPLASTY Left 1987  . TOTAL KNEE ARTHROPLASTY Left 07/21/2013   Procedure: TOTAL KNEE ARTHROPLASTY;  Surgeon: Newt Minion, MD;  Location: [place];  Service: Orthopedics;  Laterality: Left;  Left Total Knee Arthroplasty  . TOTAL KNEE ARTHROPLASTY Right 02/19/2017  . TOTAL KNEE ARTHROPLASTY Right 02/19/2017   Procedure: RIGHT TOTAL KNEE ARTHROPLASTY;  Surgeon: Newt Minion, MD;  Location: [place];  Service: Orthopedics;  Laterality: Right;  Marland Kitchen VAGINAL HYSTERECTOMY  1966   Family History  Problem Relation Age of Onset  . Heart attack Father   . Heart failure Sister   . Colon cancer Brother        died/age 32  . Lung cancer Sister   . Brain cancer Sister   . Cancer Brother        mouth  . Leukemia Brother    Social History   Socioeconomic History  . Marital status: Married    Spouse name: Not on file  . Number of children: 2  . Years of education: Not on file  . Highest education level: Not on file  Occupational History  . Occupation: Retired  Scientific laboratory technician  . Financial resource strain: Not hard at all  . Food insecurity:    Worry: Never true    Inability: Never true  . Transportation needs:     Medical: No    Non-medical: No  Tobacco Use  . Smoking status: Never Smoker  . Smokeless tobacco: Never Used  Substance and Sexual Activity  . Alcohol use: Yes    Alcohol/week: 1.0 standard drinks    Types: 1 Glasses of wine per week  . Drug use: No  . Sexual activity: Yes  Lifestyle  . Physical activity:    Days per week: 6 days    Minutes per session: 50 min  . Stress: Not at all  Relationships  . Social connections:    Talks on phone: More than three times a week  Gets together: More than three times a week    Attends religious service: More than 4 times per year    Active member of club or organization: Yes    Attends meetings of clubs or organizations: More than 4 times per year    Relationship status: Married  Other Topics Concern  . Not on file  Social History Narrative  . Not on file    Tobacco Counseling Counseling given: Not Answered  Activities of Daily Living In your present state of health, do you have any difficulty performing the following activities: 08/18/2018  Hearing? N  Vision? N  Difficulty concentrating or making decisions? N  Walking or climbing stairs? N  Dressing or bathing? N  Doing errands, shopping? N  Preparing Food and eating ? N  Using the Toilet? N  In the past six months, have you accidently leaked urine? N  Do you have problems with loss of bowel control? N  Managing your Medications? N  Managing your Finances? N  Housekeeping or managing your Housekeeping? N  Some recent data might be hidden     Immunizations and Health Maintenance Immunization History  Administered Date(s) Administered  . Influenza Split 09/05/2011, 07/23/2012  . Influenza Whole 08/25/2008, 08/17/2009, 08/09/2010  . Influenza, High Dose Seasonal PF 08/14/2017, 07/20/2018  . Influenza,inj,Quad PF,6+ Mos 07/23/2013, 07/13/2014, 07/21/2015, 07/25/2016  . Pneumococcal Conjugate-13 07/21/2015  . Pneumococcal Polysaccharide-23 11/04/2011  . Td 08/04/2006   . Tdap 11/06/2016  . Zoster 05/05/2012  . Zoster Recombinat (Shingrix) 05/27/2018   There are no preventive care reminders to display for this patient.  Patient Care Team: Biagio Borg, MD as PCP - General  Indicate any recent Medical Services you may have received from other than [facility] providers in the past year (date may be approximate).     Assessment:   This is a routine wellness examination for Leslie Bryant. Physical assessment deferred to PCP.   Hearing/Vision screen Hearing Screening Comments: Able to hear conversational tones w/o difficulty. No issues reported.  Passed whisper test Vision Screening Comments: appointment yearly  Dr. Katy Fitch  Dietary issues and exercise activities discussed: Current Exercise Habits: Home exercise routine;Structured exercise class, Type of exercise: walking;strength training/weights, Time (Minutes): 60, Frequency (Times/Week): 5, Weekly Exercise (Minutes/Week): 300, Intensity: Mild  Diet (meal preparation, eat out, water intake, caffeinated beverages, dairy products, fruits and vegetables): in general, a "healthy" diet  , well balanced   Reviewed heart healthy and diabetic diet. Encouraged patient to increase daily water and healthy fluid intake.  Goals    . Patient Stated     Continue to exercise, eat healthy, stay active in church and with family.      Depression Screen PHQ 2/9 Scores 08/18/2018 02/09/2018 11/06/2016 07/21/2015 07/21/2015  PHQ - 2 Score 0 0 0 0 0    Fall Risk Fall Risk  08/18/2018 02/09/2018 11/06/2016 07/21/2015 07/21/2015  Falls in the past year? No No No No No   Cognitive Function: MMSE - Mini Mental State Exam 08/18/2018  Orientation to time 5  Orientation to Place 5  Registration 3  Attention/ Calculation 5  Recall 2  Language- name 2 objects 2  Language- repeat 1  Language- follow 3 step command 3  Language- read & follow direction 1  Write a sentence 1  Copy design 1  Total score 29        Screening  Tests Health Maintenance  Topic Date Due  . OPHTHALMOLOGY EXAM  01/03/2019  . FOOT EXAM  02/10/2019  . URINE MICROALBUMIN  02/10/2019  . COLONOSCOPY  08/19/2019  . TETANUS/TDAP  11/06/2026  . INFLUENZA VACCINE  Completed  . DEXA SCAN  Completed  . PNA vac Low Risk Adult  Completed     Plan:     Continue doing brain stimulating activities (puzzles, reading, adult coloring books, staying active) to keep memory sharp.   Continue to eat heart healthy diet (full of fruits, vegetables, whole grains, lean protein, water--limit salt, fat, and sugar intake) and increase physical activity as tolerated.  I have personally reviewed and noted the following in the patient's chart:   . Medical and social history . Use of alcohol, tobacco or illicit drugs  . Current medications and supplements . Functional ability and status . Nutritional status . Physical activity . Advanced directives . List of other physicians . Vitals . Screenings to include cognitive, depression, and falls . Referrals and appointments  In addition, I have reviewed and discussed with patient certain preventive protocols, quality metrics, and best practice recommendations. A written personalized care plan for preventive services as well as general preventive health recommendations were provided to patient.     Michiel Cowboy, RN   08/18/2018     Medical screening examination/treatment/procedure(s) were performed by non-physician practitioner and as supervising physician I was immediately available for consultation/collaboration. I agree with above. [name], MD

## 2018-08-18 ENCOUNTER — Encounter: Payer: Self-pay | Admitting: Internal Medicine

## 2018-08-18 ENCOUNTER — Ambulatory Visit (INDEPENDENT_AMBULATORY_CARE_PROVIDER_SITE_OTHER): Payer: Medicare Other | Admitting: Internal Medicine

## 2018-08-18 ENCOUNTER — Ambulatory Visit (INDEPENDENT_AMBULATORY_CARE_PROVIDER_SITE_OTHER): Payer: Medicare Other | Admitting: *Deleted

## 2018-08-18 VITALS — BP 138/66 | HR 76 | Temp 98.1°F | Resp 18 | Ht 62.0 in | Wt 162.0 lb

## 2018-08-18 VITALS — BP 138/66 | HR 76 | Temp 98.1°F | Ht 62.0 in | Wt 162.0 lb

## 2018-08-18 DIAGNOSIS — E871 Hypo-osmolality and hyponatremia: Secondary | ICD-10-CM | POA: Diagnosis not present

## 2018-08-18 DIAGNOSIS — Z0000 Encounter for general adult medical examination without abnormal findings: Secondary | ICD-10-CM | POA: Diagnosis not present

## 2018-08-18 DIAGNOSIS — E119 Type 2 diabetes mellitus without complications: Secondary | ICD-10-CM

## 2018-08-18 DIAGNOSIS — E785 Hyperlipidemia, unspecified: Secondary | ICD-10-CM

## 2018-08-18 DIAGNOSIS — I10 Essential (primary) hypertension: Secondary | ICD-10-CM | POA: Diagnosis not present

## 2018-08-18 LAB — POCT GLYCOSYLATED HEMOGLOBIN (HGB A1C)
HBA1C, POC (CONTROLLED DIABETIC RANGE): 0 % (ref 0.0–7.0)
HBA1C, POC (PREDIABETIC RANGE): 0 % — AB (ref 5.7–6.4)
HEMOGLOBIN A1C: 0 % (ref 4.0–5.6)
HEMOGLOBIN A1C: 5.9 % — AB (ref 4.0–5.6)

## 2018-08-18 MED ORDER — METFORMIN HCL ER 500 MG PO TB24: 500.0000 mg | ORAL_TABLET | Freq: Every day | ORAL | 3 refills | Status: DC

## 2018-08-18 NOTE — Progress Notes (Signed)
Subjective:    Patient ID: Leslie Bryant, female    DOB: 1941/03/26, 77 y.o.   MRN: [id number]  HPI Here to f/u; overall doing ok,  Pt denies chest pain, increasing sob or doe, wheezing, orthopnea, PND, increased LE swelling, palpitations, dizziness or syncope.  Pt denies new neurological symptoms such as new headache, or facial or extremity weakness or numbness.  Pt denies polydipsia, polyuria, or low sugar episode.  Pt states overall good compliance with meds, mostly trying to follow appropriate diet, with wt overall stable,  but little exercise however.  Recent diverticulitis resolved.  Still having some bowel change issues with crampy pain with BM 2-3 times per day, despite the probiotic.   Past Medical History:  Diagnosis Date  . Acute meniscal tear of knee LEFT KNEE  . Arthritis    "knees" (02/19/2017)  . Breast cancer, right breast ([place]) 1986  . 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 Diverticulitis of large intestine with perforation 11/02/2011   Microperforation probably related to nut and popcorn consumption.  Resolved by CT scan Recurrent episode clinical dx 07/2012   . Diverticulosis of colon    sigmoid  . GERD (gastroesophageal reflux disease)   . H/O hiatal hernia   . History of kidney stones    "passed it" (02/19/2017)  . Hypercholesterolemia    history  . Left knee pain    occasionally  . Palpitations IRREGULAR HEARTBEAT--  CONTROLLED W/  BETA BLOCKER  . Pneumonia 2017   "walking pneumonia" (02/19/2017)  . Pre-diabetes    pcp is monitoring a1c currently 02/10/17  . Swelling of left knee joint    Past Surgical History:  Procedure Laterality Date  . BREAST BIOPSY Right 1986  . BREAST BIOPSY Left   . BREAST RECONSTRUCTION Right 1987   W/ IMPLANTS  . CARDIAC CATHETERIZATION  12-22-2000  DR BRACKBILL   NORMAL LVF/ NORMAL CORONARY ARTERIES  . CHOLECYSTECTOMY OPEN  1988  . CHONDROPLASTY  09/08/2012   Procedure: CHONDROPLASTY;  Surgeon: Tobi Bastos, MD;  Location: [facility];  Service: Orthopedics;  Laterality: Left;  . COLONOSCOPY     last colon 05-03-2009 (02/19/2017)  . COLONOSCOPY W/ BIOPSIES AND POLYPECTOMY    . KNEE ARTHROSCOPY WITH MEDIAL MENISECTOMY  09/08/2012   Procedure: KNEE ARTHROSCOPY WITH MEDIAL MENISECTOMY;  Surgeon: Tobi Bastos, MD;  Location: [facility];  Service: Orthopedics;  Laterality: Left;  lateral tibial plateau,   . MASTECTOMY Right 1986   W/ MULTIPLE NODE DISSECTION  . REDUCTION MAMMAPLASTY Left 1987  . TOTAL KNEE ARTHROPLASTY Left 07/21/2013   Procedure: TOTAL KNEE ARTHROPLASTY;  Surgeon: Newt Minion, MD;  Location: [facility];  Service: Orthopedics;  Laterality: Left;  Left Total Knee Arthroplasty  . TOTAL KNEE ARTHROPLASTY Right 02/19/2017  . TOTAL KNEE ARTHROPLASTY Right 02/19/2017   Procedure: RIGHT TOTAL KNEE ARTHROPLASTY;  Surgeon: Newt Minion, MD;  Location: [facility];  Service: Orthopedics;  Laterality: Right;  Marland Kitchen VAGINAL HYSTERECTOMY  1966    reports that she has never smoked. She has never used smokeless tobacco. She reports that she drinks about 1.0 standard drinks of alcohol per week. She reports that she does not use drugs. family history includes Brain cancer in her sister; Cancer in her brother; Colon cancer in her brother; Heart attack in her father; Heart failure in her sister; Leukemia in her brother; Lung cancer in her sister. Allergies  Allergen Reactions  . Crestor [employer]  Calcium] Other (See Comments)    Shoulder pain  . Lipitor [Atorvastatin Calcium] Other (See Comments)    Myalgias   . Pravastatin Other (See Comments)    myalgias  . Penicillins Itching    Has patient had a PCN reaction causing immediate rash, facial/tongue/throat swelling, SOB or lightheadedness with hypotension: No Has patient had a PCN reaction causing severe rash involving mucus membranes or skin necrosis: No Has patient had a PCN reaction that required hospitalization No Has patient had a PCN  reaction occurring within the last 10 years: No If all of the above answers are "NO", then may proceed with Cephalosporin use.    Current Outpatient Medications on File Prior to Visit  Medication Sig Dispense Refill  . aspirin 81 MG tablet Take 81 mg by mouth 2 (two) times daily.    Marland Kitchen BIOTIN 5000 PO Take 5,000 mcg by mouth daily.     . bumetanide (BUMEX) 1 MG tablet TAKE 1 TABLET (1 MG TOTAL) BY MOUTH DAILY AS NEEDED 90 tablet 1  . co-enzyme Q-10 30 MG capsule Take 30 mg by mouth 2 (two) times daily.     Marland Kitchen ezetimibe (ZETIA) 10 MG tablet TAKE 1 TABLET (10 MG TOTAL) BY MOUTH DAILY. 90 tablet 3  . fish oil-omega-3 fatty acids 1000 MG capsule Take 1 g by mouth daily.     Marland Kitchen ibuprofen (ADVIL,MOTRIN) 200 MG tablet Take 400 mg by mouth 2 (two) times daily as needed for moderate pain.    . metoprolol tartrate (LOPRESSOR) 50 MG tablet Take 1 tablet (50 mg total) by mouth 2 (two) times daily. 180 tablet 3  . Multiple Vitamins-Minerals (CENTRUM SILVER PO) Take 1 tablet by mouth every morning.     Marland Kitchen omeprazole (PRILOSEC) 40 MG capsule Take 1 capsule (40 mg total) by mouth 2 (two) times daily before a meal. 180 capsule 3  . triamcinolone cream (KENALOG) 0.1 % APPLY TO AFFECTED AREA TWICE A DAY 30 g 0   No current facility-administered medications on file prior to visit.     Review of Systems  Constitutional: Negative for other unusual diaphoresis or sweats HENT: Negative for ear discharge or swelling Eyes: Negative for other worsening visual disturbances Respiratory: Negative for stridor or other swelling  Gastrointestinal: Negative for worsening distension or other blood Genitourinary: Negative for retention or other urinary change Musculoskeletal: Negative for other MSK pain or swelling Skin: Negative for color change or other new lesions Neurological: Negative for worsening tremors and other numbness  Psychiatric/Behavioral: Negative for worsening agitation or other fatigue All other system neg  per pt    Objective:   Physical Exam BP 138/66   Pulse 76   Temp 98.1 F (36.7 C) (Oral)   Ht 5\' 2"  (1.575 m)   Wt 162 lb (73.5 kg)   SpO2 98%   BMI 29.63 kg/m  VS noted,  Constitutional: Pt appears in NAD HENT: Head: NCAT.  Right Ear: External ear normal.  Left Ear: External ear normal.  Eyes: . Pupils are equal, round, and reactive to light. Conjunctivae and EOM are normal Nose: without d/c or deformity Neck: Neck supple. Gross normal ROM Cardiovascular: Normal rate and regular rhythm.   Pulmonary/Chest: Effort normal and breath sounds without rales or wheezing.  Abd:  Soft, NT, ND, + BS, no organomegaly Neurological: Pt is alert. At baseline orientation, motor grossly intact Skin: Skin is warm. No rashes, other new lesions, no LE edema Psychiatric: Pt behavior is normal without agitation  No  other exam findings  Lab Results  Component Value Date   WBC 11.7 (H) 07/20/2018   HGB 12.4 07/20/2018   HCT 36.1 07/20/2018   PLT 315.0 07/20/2018   GLUCOSE 113 (H) 07/20/2018   CHOL 163 02/09/2018   TRIG 126.0 02/09/2018   HDL 52.70 02/09/2018   LDLDIRECT 111.9 07/13/2014   LDLCALC 85 02/09/2018   ALT 31 07/20/2018   AST 25 07/20/2018   NA 132 (L) 07/20/2018   K 4.2 07/20/2018   CL 97 07/20/2018   CREATININE 0.78 07/20/2018   BUN 10 07/20/2018   CO2 29 07/20/2018   TSH 1.27 02/09/2018   INR 1.01 07/13/2013   HGBA1C 6.8 (H) 02/09/2018   MICROALBUR <0.7 02/09/2018   Contains abnormal data POCT glycosylated hemoglobin (Hb A1C)  Order: [id number]  Status:  Final result Visible to patient:  No (Not Released) Dx:  Type 2 diabetes mellitus without comp...   Ref Range & Units 10:21 (08/18/18) 7mo ago (02/09/18) 65yr ago (02/10/17) 21yr ago (11/06/16)  Hemoglobin A1C 4.0 - 5.6 % 5.9Abnormal   6.8High  R, CM 6.2High  R, CM 6.8High             Assessment & Plan:

## 2018-08-18 NOTE — Assessment & Plan Note (Signed)
stable overall by history and exam, recent data reviewed with pt, and pt to continue medical treatment as before,  to f/u any worsening symptoms or concerns  

## 2018-08-18 NOTE — Assessment & Plan Note (Signed)
Mild ? Chronic, for f/u lab next visit

## 2018-08-18 NOTE — Patient Instructions (Signed)
Continue doing brain stimulating activities (puzzles, reading, adult coloring books, staying active) to keep memory sharp.   Continue to eat heart healthy diet (full of fruits, vegetables, whole grains, lean protein, water--limit salt, fat, and sugar intake) and increase physical activity as tolerated.   Ms. Leslie Bryant , Thank you for taking time to come for your Medicare Wellness Visit. I appreciate your ongoing commitment to your health goals. Please review the following plan we discussed and let me know if I can assist you in the future.   These are the goals we discussed: Goals    . Patient Stated     Continue to exercise, eat healthy, stay active in church and with family.       This is a list of the screening recommended for you and due dates:  Health Maintenance  Topic Date Due  . Eye exam for diabetics  02/24/2018  . Complete foot exam   02/10/2019  . Urine Protein Check  02/10/2019  . Colon Cancer Screening  08/19/2019  . Tetanus Vaccine  11/06/2026  . Flu Shot  Completed  . DEXA scan (bone density measurement)  Completed  . Pneumonia vaccines  Completed   Health Maintenance, Female Adopting a healthy lifestyle and getting preventive care can go a long way to promote health and wellness. Talk with your health care provider about what schedule of regular examinations is right for you. This is a good chance for you to check in with your provider about disease prevention and staying healthy. In between checkups, there are plenty of things you can do on your own. Experts have done a lot of research about which lifestyle changes and preventive measures are most likely to keep you healthy. Ask your health care provider for more information. Weight and diet Eat a healthy diet  Be sure to include plenty of vegetables, fruits, low-fat dairy products, and lean protein.  Do not eat a lot of foods high in solid fats, added sugars, or salt.  Get regular exercise. This is one of the most  important things you can do for your health. ? Most adults should exercise for at least 150 minutes each week. The exercise should increase your heart rate and make you sweat (moderate-intensity exercise). ? Most adults should also do strengthening exercises at least twice a week. This is in addition to the moderate-intensity exercise.  Maintain a healthy weight  Body mass index (BMI) is a measurement that can be used to identify possible weight problems. It estimates body fat based on height and weight. Your health care provider can help determine your BMI and help you achieve or maintain a healthy weight.  For females 64 years of age and older: ? A BMI below 18.5 is considered underweight. ? A BMI of 18.5 to 24.9 is normal. ? A BMI of 25 to 29.9 is considered overweight. ? A BMI of 30 and above is considered obese.  Watch levels of cholesterol and blood lipids  You should start having your blood tested for lipids and cholesterol at 78 years of age, then have this test every 5 years.  You may need to have your cholesterol levels checked more often if: ? Your lipid or cholesterol levels are high. ? You are older than 78 years of age. ? You are at high risk for heart disease.  Cancer screening Lung Cancer  Lung cancer screening is recommended for adults 31-36 years old who are at high risk for lung cancer because of a  history of smoking.  A yearly low-dose CT scan of the lungs is recommended for people who: ? Currently smoke. ? Have quit within the past 15 years. ? Have at least a 30-pack-year history of smoking. A pack year is smoking an average of one pack of cigarettes a day for 1 year.  Yearly screening should continue until it has been 15 years since you quit.  Yearly screening should stop if you develop a health problem that would prevent you from having lung cancer treatment.  Breast Cancer  Practice breast self-awareness. This means understanding how your breasts  normally appear and feel.  It also means doing regular breast self-exams. Let your health care provider know about any changes, no matter how small.  If you are in your 20s or 30s, you should have a clinical breast exam (CBE) by a health care provider every 1-3 years as part of a regular health exam.  If you are 42 or older, have a CBE every year. Also consider having a breast X-ray (mammogram) every year.  If you have a family history of breast cancer, talk to your health care provider about genetic screening.  If you are at high risk for breast cancer, talk to your health care provider about having an MRI and a mammogram every year.  Breast cancer gene (BRCA) assessment is recommended for women who have family members with BRCA-related cancers. BRCA-related cancers include: ? Breast. ? Ovarian. ? Tubal. ? Peritoneal cancers.  Results of the assessment will determine the need for genetic counseling and BRCA1 and BRCA2 testing.  Cervical Cancer Your health care provider may recommend that you be screened regularly for cancer of the pelvic organs (ovaries, uterus, and vagina). This screening involves a pelvic examination, including checking for microscopic changes to the surface of your cervix (Pap test). You may be encouraged to have this screening done every 3 years, beginning at age 60.  For women ages 36-65, health care providers may recommend pelvic exams and Pap testing every 3 years, or they may recommend the Pap and pelvic exam, combined with testing for human papilloma virus (HPV), every 5 years. Some types of HPV increase your risk of cervical cancer. Testing for HPV may also be done on women of any age with unclear Pap test results.  Other health care providers may not recommend any screening for nonpregnant women who are considered low risk for pelvic cancer and who do not have symptoms. Ask your health care provider if a screening pelvic exam is right for you.  If you have had  past treatment for cervical cancer or a condition that could lead to cancer, you need Pap tests and screening for cancer for at least 20 years after your treatment. If Pap tests have been discontinued, your risk factors (such as having a new sexual partner) need to be reassessed to determine if screening should resume. Some women have medical problems that increase the chance of getting cervical cancer. In these cases, your health care provider may recommend more frequent screening and Pap tests.  Colorectal Cancer  This type of cancer can be detected and often prevented.  Routine colorectal cancer screening usually begins at 78 years of age and continues through 78 years of age.  Your health care provider may recommend screening at an earlier age if you have risk factors for colon cancer.  Your health care provider may also recommend using home test kits to check for hidden blood in the stool.  A small  camera at the end of a tube can be used to examine your colon directly (sigmoidoscopy or colonoscopy). This is done to check for the earliest forms of colorectal cancer.  Routine screening usually begins at age 59.  Direct examination of the colon should be repeated every 5-10 years through 78 years of age. However, you may need to be screened more often if early forms of precancerous polyps or small growths are found.  Skin Cancer  Check your skin from head to toe regularly.  Tell your health care provider about any new moles or changes in moles, especially if there is a change in a mole's shape or color.  Also tell your health care provider if you have a mole that is larger than the size of a pencil eraser.  Always use sunscreen. Apply sunscreen liberally and repeatedly throughout the day.  Protect yourself by wearing long sleeves, pants, a wide-brimmed hat, and sunglasses whenever you are outside.  Heart disease, diabetes, and high blood pressure  High blood pressure causes heart  disease and increases the risk of stroke. High blood pressure is more likely to develop in: ? People who have blood pressure in the high end of the normal range (130-139/85-89 mm Hg). ? People who are overweight or obese. ? People who are African American.  If you are 103-67 years of age, have your blood pressure checked every 3-5 years. If you are 26 years of age or older, have your blood pressure checked every year. You should have your blood pressure measured twice-once when you are at a hospital or clinic, and once when you are not at a hospital or clinic. Record the average of the two measurements. To check your blood pressure when you are not at a hospital or clinic, you can use: ? An automated blood pressure machine at a pharmacy. ? A home blood pressure monitor.  If you are between 70 years and 17 years old, ask your health care provider if you should take aspirin to prevent strokes.  Have regular diabetes screenings. This involves taking a blood sample to check your fasting blood sugar level. ? If you are at a normal weight and have a low risk for diabetes, have this test once every three years after 78 years of age. ? If you are overweight and have a high risk for diabetes, consider being tested at a younger age or more often. Preventing infection Hepatitis B  If you have a higher risk for hepatitis B, you should be screened for this virus. You are considered at high risk for hepatitis B if: ? You were born in a country where hepatitis B is common. Ask your health care provider which countries are considered high risk. ? Your parents were born in a high-risk country, and you have not been immunized against hepatitis B (hepatitis B vaccine). ? You have HIV or AIDS. ? You use needles to inject street drugs. ? You live with someone who has hepatitis B. ? You have had sex with someone who has hepatitis B. ? You get hemodialysis treatment. ? You take certain medicines for conditions,  including cancer, organ transplantation, and autoimmune conditions.  Hepatitis C  Blood testing is recommended for: ? Everyone born from [birth year] through 1966. ? Anyone with known risk factors for hepatitis C.  Sexually transmitted infections (STIs)  You should be screened for sexually transmitted infections (STIs) including gonorrhea and chlamydia if: ? You are sexually active and are younger than 78 years of  age. ? You are older than 78 years of age and your health care provider tells you that you are at risk for this type of infection. ? Your sexual activity has changed since you were last screened and you are at an increased risk for chlamydia or gonorrhea. Ask your health care provider if you are at risk.  If you do not have HIV, but are at risk, it may be recommended that you take a prescription medicine daily to prevent HIV infection. This is called pre-exposure prophylaxis (PrEP). You are considered at risk if: ? You are sexually active and do not regularly use condoms or know the HIV status of your partner(s). ? You take drugs by injection. ? You are sexually active with a partner who has HIV.  Talk with your health care provider about whether you are at high risk of being infected with HIV. If you choose to begin PrEP, you should first be tested for HIV. You should then be tested every 3 months for as long as you are taking PrEP. Pregnancy  If you are premenopausal and you may become pregnant, ask your health care provider about preconception counseling.  If you may become pregnant, take 400 to 800 micrograms (mcg) of folic acid every day.  If you want to prevent pregnancy, talk to your health care provider about birth control (contraception). Osteoporosis and menopause  Osteoporosis is a disease in which the bones lose minerals and strength with aging. This can result in serious bone fractures. Your risk for osteoporosis can be identified using a bone density scan.  If you are  63 years of age or older, or if you are at risk for osteoporosis and fractures, ask your health care provider if you should be screened.  Ask your health care provider whether you should take a calcium or vitamin D supplement to lower your risk for osteoporosis.  Menopause may have certain physical symptoms and risks.  Hormone replacement therapy may reduce some of these symptoms and risks. Talk to your health care provider about whether hormone replacement therapy is right for you. Follow these instructions at home:  Schedule regular health, dental, and eye exams.  Stay current with your immunizations.  Do not use any tobacco products including cigarettes, chewing tobacco, or electronic cigarettes.  If you are pregnant, do not drink alcohol.  If you are breastfeeding, limit how much and how often you drink alcohol.  Limit alcohol intake to no more than 1 drink per day for nonpregnant women. One drink equals 12 ounces of beer, 5 ounces of wine, or 1 ounces of hard liquor.  Do not use street drugs.  Do not share needles.  Ask your health care provider for help if you need support or information about quitting drugs.  Tell your health care provider if you often feel depressed.  Tell your health care provider if you have ever been abused or do not feel safe at home. This information is not intended to replace advice given to you by your health care provider. Make sure you discuss any questions you have with your health care provider. Document Released: 05/06/2011 Document Revised: 03/28/2016 Document Reviewed: 07/25/2015 Elsevier Interactive Patient Education  [employer].

## 2018-08-18 NOTE — Patient Instructions (Addendum)
You had the flu shot today  Your A1c was OK today  Please take all new medication as prescribed - the metformin to help sugar and weight loss  Please continue all other medications as before, and refills have been done if requested.  Please have the pharmacy call with any other refills you may need.  Please continue your efforts at being more active, low cholesterol diet, and weight control.  Please keep your appointments with your specialists as you may have planned

## 2018-08-27 ENCOUNTER — Other Ambulatory Visit: Payer: Self-pay | Admitting: Internal Medicine

## 2018-08-27 DIAGNOSIS — Z1231 Encounter for screening mammogram for malignant neoplasm of breast: Secondary | ICD-10-CM

## 2018-09-04 DIAGNOSIS — M9902 Segmental and somatic dysfunction of thoracic region: Secondary | ICD-10-CM | POA: Diagnosis not present

## 2018-09-04 DIAGNOSIS — M542 Cervicalgia: Secondary | ICD-10-CM | POA: Diagnosis not present

## 2018-09-04 DIAGNOSIS — M546 Pain in thoracic spine: Secondary | ICD-10-CM | POA: Diagnosis not present

## 2018-09-04 DIAGNOSIS — M9901 Segmental and somatic dysfunction of cervical region: Secondary | ICD-10-CM | POA: Diagnosis not present

## 2018-10-09 ENCOUNTER — Ambulatory Visit
Admission: RE | Admit: 2018-10-09 | Discharge: 2018-10-09 | Disposition: A | Discharge status: Home / Self Care | Payer: Medicare Other | Source: Ambulatory Visit | Attending: Internal Medicine | Admitting: Internal Medicine

## 2018-10-09 DIAGNOSIS — Z1231 Encounter for screening mammogram for malignant neoplasm of breast: Secondary | ICD-10-CM | POA: Diagnosis not present

## 2019-01-21 ENCOUNTER — Other Ambulatory Visit: Payer: Self-pay | Admitting: Internal Medicine

## 2019-02-18 ENCOUNTER — Ambulatory Visit: Payer: Medicare Other | Admitting: Internal Medicine

## 2019-03-25 ENCOUNTER — Other Ambulatory Visit: Payer: Self-pay | Admitting: Internal Medicine

## 2019-04-22 ENCOUNTER — Telehealth: Payer: Self-pay

## 2019-04-22 DIAGNOSIS — N6320 Unspecified lump in the left breast, unspecified quadrant: Secondary | ICD-10-CM

## 2019-04-22 NOTE — Addendum Note (Signed)
Addended by: Biagio Borg on: 04/22/2019 01:26 PM   Modules accepted: Orders

## 2019-04-22 NOTE — Telephone Encounter (Signed)
Copied from [place] [phone]. Topic: General - Other >> Apr 22, 2019  8:10 AM Carolyn Stare wrote: Pt call to say she found a lump in her left breast and is asking if a referral can be sent to the [facility] on [facility]

## 2019-04-22 NOTE — Telephone Encounter (Signed)
Ok this is done 

## 2019-04-29 ENCOUNTER — Encounter: Payer: Self-pay | Admitting: Internal Medicine

## 2019-04-29 ENCOUNTER — Ambulatory Visit (INDEPENDENT_AMBULATORY_CARE_PROVIDER_SITE_OTHER): Payer: Medicare Other | Admitting: Internal Medicine

## 2019-04-29 ENCOUNTER — Other Ambulatory Visit: Payer: Self-pay

## 2019-04-29 VITALS — BP 114/70 | HR 85 | Temp 98.8°F | Ht 62.0 in | Wt 161.0 lb

## 2019-04-29 DIAGNOSIS — E119 Type 2 diabetes mellitus without complications: Secondary | ICD-10-CM | POA: Diagnosis not present

## 2019-04-29 DIAGNOSIS — Z0000 Encounter for general adult medical examination without abnormal findings: Secondary | ICD-10-CM | POA: Diagnosis not present

## 2019-04-29 DIAGNOSIS — E538 Deficiency of other specified B group vitamins: Secondary | ICD-10-CM

## 2019-04-29 DIAGNOSIS — E611 Iron deficiency: Secondary | ICD-10-CM | POA: Diagnosis not present

## 2019-04-29 DIAGNOSIS — E559 Vitamin D deficiency, unspecified: Secondary | ICD-10-CM

## 2019-04-29 NOTE — Patient Instructions (Signed)

## 2019-04-29 NOTE — Progress Notes (Signed)
Subjective:    Patient ID: Leslie Bryant, female    DOB: Mar 13, 1941, 78 y.o.   MRN: [id number]  HPI  Here for wellness and f/u;  Overall doing ok;  Pt denies Chest pain, worsening SOB, DOE, wheezing, orthopnea, PND, worsening LE edema, palpitations, dizziness or syncope.  Pt denies neurological change such as new headache, facial or extremity weakness.  Pt denies polydipsia, polyuria, or low sugar symptoms. Pt states overall good compliance with treatment and medications, good tolerability, and has been trying to follow appropriate diet.  Pt denies worsening depressive symptoms, suicidal ideation or panic. No fever, night sweats, wt loss, loss of appetite, or other constitutional symptoms.  Pt states good ability with ADL's, has low fall risk, home safety reviewed and adequate, no other significant changes in hearing or vision, and only occasionally active with exercise. Plans to call [facility] for exam.  No new complaints Past Medical History:  Diagnosis Date  . Acute meniscal tear of knee LEFT KNEE  . Arthritis    "knees" (02/19/2017)  . Breast cancer, right breast ([place]) 1986  . 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 Diverticulitis of large intestine with perforation 11/02/2011   Microperforation probably related to nut and popcorn consumption.  Resolved by CT scan Recurrent episode clinical dx 07/2012   . Diverticulosis of colon    sigmoid  . GERD (gastroesophageal reflux disease)   . H/O hiatal hernia   . History of kidney stones    "passed it" (02/19/2017)  . Hypercholesterolemia    history  . Left knee pain    occasionally  . Palpitations IRREGULAR HEARTBEAT--  CONTROLLED W/  BETA BLOCKER  . Pneumonia 2017   "walking pneumonia" (02/19/2017)  . Pre-diabetes    pcp is monitoring a1c currently 02/10/17  . Swelling of left knee joint    Past Surgical History:  Procedure Laterality Date  . BREAST BIOPSY Right 1986  . BREAST BIOPSY Left   . BREAST RECONSTRUCTION Right 1987   W/  IMPLANTS  . CARDIAC CATHETERIZATION  12-22-2000  DR BRACKBILL   NORMAL LVF/ NORMAL CORONARY ARTERIES  . CHOLECYSTECTOMY OPEN  1988  . CHONDROPLASTY  09/08/2012   Procedure: CHONDROPLASTY;  Surgeon: Tobi Bastos, MD;  Location: [facility];  Service: Orthopedics;  Laterality: Left;  . COLONOSCOPY     last colon 05-03-2009 (02/19/2017)  . COLONOSCOPY W/ BIOPSIES AND POLYPECTOMY    . KNEE ARTHROSCOPY WITH MEDIAL MENISECTOMY  09/08/2012   Procedure: KNEE ARTHROSCOPY WITH MEDIAL MENISECTOMY;  Surgeon: Tobi Bastos, MD;  Location: [place];  Service: Orthopedics;  Laterality: Left;  lateral tibial plateau,   . MASTECTOMY Right 1986   W/ MULTIPLE NODE DISSECTION  . REDUCTION MAMMAPLASTY Left 1987  . TOTAL KNEE ARTHROPLASTY Left 07/21/2013   Procedure: TOTAL KNEE ARTHROPLASTY;  Surgeon: Newt Minion, MD;  Location: [place];  Service: Orthopedics;  Laterality: Left;  Left Total Knee Arthroplasty  . TOTAL KNEE ARTHROPLASTY Right 02/19/2017  . TOTAL KNEE ARTHROPLASTY Right 02/19/2017   Procedure: RIGHT TOTAL KNEE ARTHROPLASTY;  Surgeon: Newt Minion, MD;  Location: [place];  Service: Orthopedics;  Laterality: Right;  Marland Kitchen VAGINAL HYSTERECTOMY  1966    reports that she has never smoked. She has never used smokeless tobacco. She reports current alcohol use of about 1.0 standard drinks of alcohol per week. She reports that she does not use drugs. family history includes Brain cancer in her sister;  Cancer in her brother; Colon cancer in her brother; Heart attack in her father; Heart failure in her sister; Leukemia in her brother; Lung cancer in her sister. Allergies  Allergen Reactions  . Crestor [Rosuvastatin Calcium] Other (See Comments)    Shoulder pain  . Lipitor [Atorvastatin Calcium] Other (See Comments)    Myalgias   . Pravastatin Other (See Comments)    myalgias  . Penicillins Itching    Has patient had a PCN reaction causing immediate rash,  facial/tongue/throat swelling, SOB or lightheadedness with hypotension: No Has patient had a PCN reaction causing severe rash involving mucus membranes or skin necrosis: No Has patient had a PCN reaction that required hospitalization No Has patient had a PCN reaction occurring within the last 10 years: No If all of the above answers are "NO", then may proceed with Cephalosporin use.    Current Outpatient Medications on File Prior to Visit  Medication Sig Dispense Refill  . aspirin 81 MG tablet Take 81 mg by mouth 2 (two) times daily.    Marland Kitchen BIOTIN 5000 PO Take 5,000 mcg by mouth daily.     . bumetanide (BUMEX) 1 MG tablet TAKE 1 TABLET (1 MG TOTAL) BY MOUTH DAILY AS NEEDED 90 tablet 1  . co-enzyme Q-10 30 MG capsule Take 30 mg by mouth 2 (two) times daily.     Marland Kitchen ezetimibe (ZETIA) 10 MG tablet Take 1 tablet by mouth once daily 90 tablet 1  . fish oil-omega-3 fatty acids 1000 MG capsule Take 1 g by mouth daily.     Marland Kitchen ibuprofen (ADVIL,MOTRIN) 200 MG tablet Take 400 mg by mouth 2 (two) times daily as needed for moderate pain.    . metFORMIN (GLUCOPHAGE-XR) 500 MG 24 hr tablet Take 1 tablet (500 mg total) by mouth daily with breakfast. 90 tablet 3  . metoprolol tartrate (LOPRESSOR) 50 MG tablet Take 1 tablet by mouth twice daily 180 tablet 0  . Multiple Vitamins-Minerals (CENTRUM SILVER PO) Take 1 tablet by mouth every morning.     Marland Kitchen omeprazole (PRILOSEC) 40 MG capsule Take 1 capsule (40 mg total) by mouth 2 (two) times daily before a meal. 180 capsule 3  . triamcinolone cream (KENALOG) 0.1 % APPLY TO AFFECTED AREA TWICE A DAY 30 g 0   No current facility-administered medications on file prior to visit.    Review of Systems Constitutional: Negative for other unusual diaphoresis, sweats, appetite or weight changes HENT: Negative for other worsening hearing loss, ear pain, facial swelling, mouth sores or neck stiffness.   Eyes: Negative for other worsening pain, redness or other visual disturbance.   Respiratory: Negative for other stridor or swelling Cardiovascular: Negative for other palpitations or other chest pain  Gastrointestinal: Negative for worsening diarrhea or loose stools, blood in stool, distention or other pain Genitourinary: Negative for hematuria, flank pain or other change in urine volume.  Musculoskeletal: Negative for myalgias or other joint swelling.  Skin: Negative for other color change, or other wound or worsening drainage.  Neurological: Negative for other syncope or numbness. Hematological: Negative for other adenopathy or swelling Psychiatric/Behavioral: Negative for hallucinations, other worsening agitation, SI, self-injury, or new decreased concentration All other system neg per pt    Objective:   Physical Exam BP 114/70   Pulse 85   Temp 98.8 F (37.1 C) (Oral)   Ht 5\' 2"  (1.575 m)   Wt 161 lb (73 kg)   SpO2 98%   BMI 29.45 kg/m  VS noted,  Constitutional:  Pt is oriented to person, place, and time. Appears well-developed and well-nourished, in no significant distress and comfortable Head: Normocephalic and atraumatic  Eyes: Conjunctivae and EOM are normal. Pupils are equal, round, and reactive to light Right Ear: External ear normal without discharge Left Ear: External ear normal without discharge Nose: Nose without discharge or deformity Mouth/Throat: Oropharynx is without other ulcerations and moist  Neck: Normal range of motion. Neck supple. No JVD present. No tracheal deviation present or significant neck LA or mass Cardiovascular: Normal rate, regular rhythm, normal heart sounds and intact distal pulses.   Pulmonary/Chest: WOB normal and breath sounds without rales or wheezing  Abdominal: Soft. Bowel sounds are normal. NT. No HSM  Musculoskeletal: Normal range of motion. Exhibits no edema Lymphadenopathy: Has no other cervical adenopathy.  Neurological: Pt is alert and oriented to person, place, and time. Pt has normal reflexes. No cranial  nerve deficit. Motor grossly intact, Gait intact Skin: Skin is warm and dry. No rash noted or new ulcerations Psychiatric:  Has normal mood and affect. Behavior is normal without agitation No other exam findings Lab Results  Component Value Date   WBC 11.7 (H) 07/20/2018   HGB 12.4 07/20/2018   HCT 36.1 07/20/2018   PLT 315.0 07/20/2018   GLUCOSE 113 (H) 07/20/2018   CHOL 163 02/09/2018   TRIG 126.0 02/09/2018   HDL 52.70 02/09/2018   LDLDIRECT 111.9 07/13/2014   LDLCALC 85 02/09/2018   ALT 31 07/20/2018   AST 25 07/20/2018   NA 132 (L) 07/20/2018   K 4.2 07/20/2018   CL 97 07/20/2018   CREATININE 0.78 07/20/2018   BUN 10 07/20/2018   CO2 29 07/20/2018   TSH 1.27 02/09/2018   INR 1.01 07/13/2013   HGBA1C 5.9 (A) 08/18/2018   HGBA1C 0 08/18/2018   HGBA1C 0 (A) 08/18/2018   HGBA1C 0.0 08/18/2018   MICROALBUR <0.7 02/09/2018       Assessment & Plan:

## 2019-05-02 ENCOUNTER — Encounter: Payer: Self-pay | Admitting: Internal Medicine

## 2019-05-02 NOTE — Assessment & Plan Note (Signed)

## 2019-05-02 NOTE — Assessment & Plan Note (Signed)
stable overall by history and exam, recent data reviewed with pt, and pt to continue medical treatment as before,  to f/u any worsening symptoms or concerns  

## 2019-05-03 ENCOUNTER — Other Ambulatory Visit (INDEPENDENT_AMBULATORY_CARE_PROVIDER_SITE_OTHER): Payer: Medicare Other

## 2019-05-03 DIAGNOSIS — E538 Deficiency of other specified B group vitamins: Secondary | ICD-10-CM | POA: Diagnosis not present

## 2019-05-03 DIAGNOSIS — Z0000 Encounter for general adult medical examination without abnormal findings: Secondary | ICD-10-CM | POA: Diagnosis not present

## 2019-05-03 DIAGNOSIS — E119 Type 2 diabetes mellitus without complications: Secondary | ICD-10-CM

## 2019-05-03 DIAGNOSIS — E559 Vitamin D deficiency, unspecified: Secondary | ICD-10-CM | POA: Diagnosis not present

## 2019-05-03 DIAGNOSIS — E611 Iron deficiency: Secondary | ICD-10-CM

## 2019-05-03 LAB — CBC WITH DIFFERENTIAL/PLATELET
Basophils Absolute: 0.1 10*3/uL (ref 0.0–0.1)
Basophils Relative: 0.7 % (ref 0.0–3.0)
Eosinophils Absolute: 0.2 10*3/uL (ref 0.0–0.7)
Eosinophils Relative: 2.3 % (ref 0.0–5.0)
HCT: 34.9 % — ABNORMAL LOW (ref 36.0–46.0)
Hemoglobin: 11.7 g/dL — ABNORMAL LOW (ref 12.0–15.0)
Lymphocytes Relative: 36.1 % (ref 12.0–46.0)
Lymphs Abs: 3.3 10*3/uL (ref 0.7–4.0)
MCHC: 33.6 g/dL (ref 30.0–36.0)
MCV: 93.8 fl (ref 78.0–100.0)
Monocytes Absolute: 1 10*3/uL (ref 0.1–1.0)
Monocytes Relative: 10.9 % (ref 3.0–12.0)
Neutro Abs: 4.6 10*3/uL (ref 1.4–7.7)
Neutrophils Relative %: 50 % (ref 43.0–77.0)
Platelets: 315 10*3/uL (ref 150.0–400.0)
RBC: 3.72 Mil/uL — ABNORMAL LOW (ref 3.87–5.11)
RDW: 12.3 % (ref 11.5–15.5)
WBC: 9.3 10*3/uL (ref 4.0–10.5)

## 2019-05-03 LAB — HEMOGLOBIN A1C: Hgb A1c MFr Bld: 6.6 % — ABNORMAL HIGH (ref 4.6–6.5)

## 2019-05-04 LAB — IBC PANEL
Iron: 52 ug/dL (ref 42–145)
Saturation Ratios: 16.7 % — ABNORMAL LOW (ref 20.0–50.0)
Transferrin: 223 mg/dL (ref 212.0–360.0)

## 2019-05-04 LAB — LIPID PANEL
Cholesterol: 169 mg/dL (ref 0–200)
HDL: 46.7 mg/dL (ref >39.00)
NonHDL: 122.54
Total CHOL/HDL Ratio: 4
Triglycerides: 210 mg/dL — ABNORMAL HIGH (ref 0.0–149.0)
VLDL: 42 mg/dL — ABNORMAL HIGH (ref 0.0–40.0)

## 2019-05-04 LAB — LDL CHOLESTEROL, DIRECT: Direct LDL: 112 mg/dL

## 2019-05-04 LAB — HEPATIC FUNCTION PANEL
ALT: 24 U/L (ref 0–35)
AST: 20 U/L (ref 0–37)
Albumin: 4 g/dL (ref 3.5–5.2)
Alkaline Phosphatase: 65 U/L (ref 39–117)
Bilirubin, Direct: 0 mg/dL (ref 0.0–0.3)
Total Bilirubin: 0.2 mg/dL (ref 0.2–1.2)
Total Protein: 7.5 g/dL (ref 6.0–8.3)

## 2019-05-04 LAB — BASIC METABOLIC PANEL
BUN: 16 mg/dL (ref 6–23)
CO2: 27 mEq/L (ref 19–32)
Calcium: 9.4 mg/dL (ref 8.4–10.5)
Chloride: 96 mEq/L (ref 96–112)
Creatinine, Ser: 0.94 mg/dL (ref 0.40–1.20)
GFR: 57.47 mL/min — ABNORMAL LOW (ref >60.00)
Glucose, Bld: 100 mg/dL — ABNORMAL HIGH (ref 70–99)
Potassium: 4.3 mEq/L (ref 3.5–5.1)
Sodium: 131 mEq/L — ABNORMAL LOW (ref 135–145)

## 2019-05-04 LAB — VITAMIN B12: Vitamin B-12: 602 pg/mL (ref 211–911)

## 2019-05-04 LAB — VITAMIN D 25 HYDROXY (VIT D DEFICIENCY, FRACTURES): VITD: 55.9 ng/mL (ref 30.00–100.00)

## 2019-05-04 LAB — MICROALBUMIN / CREATININE URINE RATIO
Creatinine,U: 44.7 mg/dL
Microalb Creat Ratio: 1.6 mg/g (ref 0.0–30.0)
Microalb, Ur: <0.7 mg/dL (ref 0.0–1.9)

## 2019-05-04 LAB — TSH: TSH: 1.46 u[IU]/mL (ref 0.35–4.50)

## 2019-05-05 ENCOUNTER — Ambulatory Visit
Admission: RE | Admit: 2019-05-05 | Discharge: 2019-05-05 | Disposition: A | Discharge status: Home / Self Care | Payer: Medicare Other | Source: Ambulatory Visit | Attending: Internal Medicine | Admitting: Internal Medicine

## 2019-05-05 ENCOUNTER — Other Ambulatory Visit: Payer: Self-pay

## 2019-05-05 DIAGNOSIS — N6320 Unspecified lump in the left breast, unspecified quadrant: Secondary | ICD-10-CM

## 2019-05-05 DIAGNOSIS — R928 Other abnormal and inconclusive findings on diagnostic imaging of breast: Secondary | ICD-10-CM | POA: Diagnosis not present

## 2019-05-05 DIAGNOSIS — N644 Mastodynia: Secondary | ICD-10-CM | POA: Diagnosis not present

## 2019-05-20 ENCOUNTER — Telehealth: Payer: Self-pay | Admitting: Orthopedic Surgery

## 2019-05-20 NOTE — Telephone Encounter (Signed)
Patient was scheduled to come in for next Wednesday for an appt for her knees. She stated that she is stiff and is still having issues.

## 2019-05-20 NOTE — Telephone Encounter (Signed)
Received voicemail message from patient asking if she can get an order for more therapy for her knee. The number to contact patient is [phone]

## 2019-05-24 ENCOUNTER — Encounter: Payer: Self-pay | Admitting: Internal Medicine

## 2019-05-24 ENCOUNTER — Ambulatory Visit (INDEPENDENT_AMBULATORY_CARE_PROVIDER_SITE_OTHER): Payer: Medicare Other | Admitting: Internal Medicine

## 2019-05-24 ENCOUNTER — Other Ambulatory Visit: Payer: Self-pay

## 2019-05-24 VITALS — BP 145/85 | HR 93 | Temp 98.1°F | Ht 62.0 in | Wt 163.0 lb

## 2019-05-24 DIAGNOSIS — E782 Mixed hyperlipidemia: Secondary | ICD-10-CM | POA: Diagnosis not present

## 2019-05-24 DIAGNOSIS — I10 Essential (primary) hypertension: Secondary | ICD-10-CM | POA: Diagnosis not present

## 2019-05-24 DIAGNOSIS — I471 Supraventricular tachycardia: Secondary | ICD-10-CM

## 2019-05-24 NOTE — Progress Notes (Signed)
OFFICE NOTE  Chief Complaint:  Palpitations  Primary Care Physician: Biagio Borg, MD  HPI:  Leslie Bryant is a 78 y.o. female who is a former patient of Dr. Mare Ferrari. She is followed along with him for a number years and he also followed her husband who unfortunately died. She has no known coronary artery disease. She had heart catheterization 2002 which showed essentially normal coronaries. She's been followed for palpitations, history of PSVT which is been well controlled and dyslipidemia. Recently she was seen in the emergency department after working strenuously in the yard on a very hot day. She felt some fluttering in her chest and was noted to have some unifocal PVCs on a monitor. Recently her metoprolol was increased to 50 mg twice a day. She was advised to decrease caffeine intake. Since that time she's had no further recurrence and felt like it may be related to working outside in the hot weather. Blood pressure is well-controlled today. She has a history of intolerance to statins. Recently she had had some pain in her legs and thought it was related to study of. She was taken off that medicine and listed as an allergy however her symptoms did not change therefore she is interested in going back on the medicine. While she was on Zetia her LDL was 120 which is reasonable control.   12/24/2016  Leslie Bryant returns a for follow-up. Overall she seems to be doing well although suffering from some right knee pain. She told me that she intends to have right knee surgery hopefully in April with Dr. Sharol Given. She is asking for cardiovascular clearance. She denies any chest pain or worsening shortness of breath. Her blood pressure is well-controlled. She has been tolerant of ezetimibe 10 mg daily for cholesterol and recently her LDL was less than 80. She's previous he been intolerant of pravastatin, atorvastatin and rosuvastatin.  12/17/2017  Leslie Bryant is seen today in follow-up.  She was  recently in the emergency department beginning of February with some palpitations.  She also was describing some chest pain.  It was felt that this was more likely acid reflux.  Her PPI was doubled and her symptoms have improved.  She was noted to have PVCs, but this is known in her history.  She says her palpitations have improved and may be a lot related to stress.  We discussed possibility of using extra metoprolol or increasing her dose if necessary.  05/24/2019  Leslie Bryant is seen today in follow-up.  Overall she reports good control in her palpitations after we further increased her metoprolol to 50 mg twice daily.  Blood pressures generally in the 629-476 systolic range however was elevated this morning.  She reported she did not take her beta-blocker this morning.  She has normal sinus rhythm on EKG at 91.  She denies any chest pain or shortness of breath.  PMHx:  Past Medical History:  Diagnosis Date  . Acute meniscal tear of knee LEFT KNEE  . Arthritis    "knees" (02/19/2017)  . Breast cancer, right breast ([place]) 1986  . 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 Diverticulitis of large intestine with perforation 11/02/2011   Microperforation probably related to nut and popcorn consumption.  Resolved by CT scan Recurrent episode clinical dx 07/2012   . Diverticulosis of colon    sigmoid  . GERD (gastroesophageal reflux disease)   . H/O hiatal hernia   . History of kidney stones    "  passed it" (02/19/2017)  . Hypercholesterolemia    history  . Left knee pain    occasionally  . Palpitations IRREGULAR HEARTBEAT--  CONTROLLED W/  BETA BLOCKER  . Pneumonia 2017   "walking pneumonia" (02/19/2017)  . Pre-diabetes    pcp is monitoring a1c currently 02/10/17  . Swelling of left knee joint     Past Surgical History:  Procedure Laterality Date  . BREAST BIOPSY Right 1986  . BREAST BIOPSY Left   . BREAST RECONSTRUCTION Right 1987   W/ IMPLANTS  . CARDIAC CATHETERIZATION  12-22-2000  DR  BRACKBILL   NORMAL LVF/ NORMAL CORONARY ARTERIES  . CHOLECYSTECTOMY OPEN  1988  . CHONDROPLASTY  09/08/2012   Procedure: CHONDROPLASTY;  Surgeon: Tobi Bastos, MD;  Location: [facility];  Service: Orthopedics;  Laterality: Left;  . COLONOSCOPY     last colon 05-03-2009 (02/19/2017)  . COLONOSCOPY W/ BIOPSIES AND POLYPECTOMY    . KNEE ARTHROSCOPY WITH MEDIAL MENISECTOMY  09/08/2012   Procedure: KNEE ARTHROSCOPY WITH MEDIAL MENISECTOMY;  Surgeon: Tobi Bastos, MD;  Location: [place];  Service: Orthopedics;  Laterality: Left;  lateral tibial plateau,   . MASTECTOMY Right 1986   W/ MULTIPLE NODE DISSECTION  . REDUCTION MAMMAPLASTY Left 1987  . TOTAL KNEE ARTHROPLASTY Left 07/21/2013   Procedure: TOTAL KNEE ARTHROPLASTY;  Surgeon: Newt Minion, MD;  Location: [place];  Service: Orthopedics;  Laterality: Left;  Left Total Knee Arthroplasty  . TOTAL KNEE ARTHROPLASTY Right 02/19/2017  . TOTAL KNEE ARTHROPLASTY Right 02/19/2017   Procedure: RIGHT TOTAL KNEE ARTHROPLASTY;  Surgeon: Newt Minion, MD;  Location: [place];  Service: Orthopedics;  Laterality: Right;  Marland Kitchen VAGINAL HYSTERECTOMY  1966    FAMHx:  Family History  Problem Relation Age of Onset  . Heart attack Father   . Heart failure Sister   . Colon cancer Brother        died/age 5  . Lung cancer Sister   . Brain cancer Sister   . Cancer Brother        mouth  . Leukemia Brother     SOCHx:   reports that she has never smoked. She has never used smokeless tobacco. She reports current alcohol use of about 1.0 standard drinks of alcohol per week. She reports that she does not use drugs.  ALLERGIES:  Allergies  Allergen Reactions  . Crestor [Rosuvastatin Calcium] Other (See Comments)    Shoulder pain  . Lipitor [Atorvastatin Calcium] Other (See Comments)    Myalgias   . Pravastatin Other (See Comments)    myalgias  . Penicillins Itching    Has patient had a PCN reaction causing immediate  rash, facial/tongue/throat swelling, SOB or lightheadedness with hypotension: No Has patient had a PCN reaction causing severe rash involving mucus membranes or skin necrosis: No Has patient had a PCN reaction that required hospitalization No Has patient had a PCN reaction occurring within the last 10 years: No If all of the above answers are "NO", then may proceed with Cephalosporin use.     ROS: Pertinent items noted in HPI and remainder of comprehensive ROS otherwise negative.  HOME MEDS: Current Outpatient Medications  Medication Sig Dispense Refill  . aspirin 81 MG tablet Take 81 mg by mouth 2 (two) times daily.    Marland Kitchen BIOTIN 5000 PO Take 5,000 mcg by mouth daily.     . bumetanide (BUMEX) 1 MG tablet TAKE 1 TABLET (1 MG TOTAL) BY MOUTH DAILY AS  NEEDED 90 tablet 1  . co-enzyme Q-10 30 MG capsule Take 30 mg by mouth 2 (two) times daily.     Marland Kitchen ezetimibe (ZETIA) 10 MG tablet Take 1 tablet by mouth once daily 90 tablet 1  . fish oil-omega-3 fatty acids 1000 MG capsule Take 1 g by mouth daily.     Marland Kitchen ibuprofen (ADVIL,MOTRIN) 200 MG tablet Take 400 mg by mouth 2 (two) times daily as needed for moderate pain.    . metFORMIN (GLUCOPHAGE-XR) 500 MG 24 hr tablet Take 1 tablet (500 mg total) by mouth daily with breakfast. 90 tablet 3  . metoprolol tartrate (LOPRESSOR) 50 MG tablet Take 1 tablet by mouth twice daily 180 tablet 0  . Multiple Vitamins-Minerals (CENTRUM SILVER PO) Take 1 tablet by mouth every morning.     Marland Kitchen omeprazole (PRILOSEC) 40 MG capsule Take 1 capsule (40 mg total) by mouth 2 (two) times daily before a meal. 180 capsule 3  . triamcinolone cream (KENALOG) 0.1 % APPLY TO AFFECTED AREA TWICE A DAY 30 g 0   No current facility-administered medications for this visit.     LABS/IMAGING: No results found for this or any previous visit (from the past 48 hour(s)). No results found.  WEIGHTS: Wt Readings from Last 3 Encounters:  05/24/19 163 lb (73.9 kg)  04/29/19 161 lb (73 kg)   08/18/18 162 lb (73.5 kg)    VITALS: BP (!) 145/85   Pulse 93   Temp 98.1 F (36.7 C)   Ht 5\' 2"  (1.575 m)   Wt 163 lb (73.9 kg)   SpO2 97%   BMI 29.81 kg/m   EXAM: General appearance: alert and no distress Neck: no carotid bruit and no JVD Lungs: clear to auscultation bilaterally Heart: regular rate and rhythm and systolic murmur: early systolic 2/6, crescendo at 2nd right intercostal space Abdomen: soft, non-tender; bowel sounds normal; no masses,  no organomegaly Extremities: extremities normal, atraumatic, no cyanosis or edema Pulses: 2+ and symmetric Skin: Skin color, texture, turgor normal. No rashes or lesions Neurologic: Grossly normal Psych: Pleasant  EKG: Normal sinus rhythm 91-personally reviewed  ASSESSMENT: 1. Palpitations-history of PSVT and occasional PVCs 2. Dyslipidemia-intolerant to statins 3. Essential hypertension 4. GERD  PLAN: 1.   Mrs. Tuckerman reports good control of her palpitations on current dose of metoprolol.  Her blood pressure was slightly elevated today however she did not take her medication.  She has dyslipidemia with recently elevated triglycerides and is only on ezetimibe due to statin intolerance.  She also reports some dietary indiscretions.  I think she could improve on that and asked her to work aggressively on her numbers.  Plan follow-up with me annually or sooner as necessary.  Pixie Casino, MD, [facility], [place] Director of the Advanced Lipid Disorders &  Cardiovascular Risk Reduction Clinic Diplomate of the American Board of Clinical Lipidology Attending Cardiologist  Direct Dial: [phone]  Fax: [phone]  Website:  [email or web address] 05/24/2019, 9:01 AM

## 2019-05-24 NOTE — Patient Instructions (Signed)
Medication Instructions:  Your physician recommends that you continue on your current medications as directed. Please refer to the Current Medication list given to you today.  If you need a refill on your cardiac medications before your next appointment, please call your pharmacy.    Follow-Up: At [facility], you and your health needs are our priority.  As part of our continuing mission to provide you with exceptional heart care, we have created designated Provider Care Teams.  These Care Teams include your primary Cardiologist (physician) and Advanced Practice Providers (APPs -  Physician Assistants and Nurse Practitioners) who all work together to provide you with the care you need, when you need it. You will need a follow up appointment in 12 months.  Please call our office 2 months in advance to schedule this appointment.  You may see Kenneth C Hilty, MD or one of the following Advanced Practice Providers on your designated Care Team: Hao Meng, PA-C . Angela Duke, PA-C  Any Other Special Instructions Will Be Listed Below (If Applicable).    

## 2019-05-25 DIAGNOSIS — H11442 Conjunctival cysts, left eye: Secondary | ICD-10-CM | POA: Diagnosis not present

## 2019-05-25 DIAGNOSIS — H2513 Age-related nuclear cataract, bilateral: Secondary | ICD-10-CM | POA: Diagnosis not present

## 2019-05-25 DIAGNOSIS — H43813 Vitreous degeneration, bilateral: Secondary | ICD-10-CM | POA: Diagnosis not present

## 2019-05-26 ENCOUNTER — Ambulatory Visit: Payer: Medicare Other | Admitting: Family

## 2019-07-26 ENCOUNTER — Other Ambulatory Visit: Payer: Self-pay | Admitting: Internal Medicine

## 2019-07-28 ENCOUNTER — Encounter: Payer: Self-pay | Admitting: Internal Medicine

## 2019-07-28 ENCOUNTER — Ambulatory Visit (INDEPENDENT_AMBULATORY_CARE_PROVIDER_SITE_OTHER): Payer: Medicare Other | Admitting: Internal Medicine

## 2019-07-28 ENCOUNTER — Other Ambulatory Visit: Payer: Self-pay

## 2019-07-28 DIAGNOSIS — E119 Type 2 diabetes mellitus without complications: Secondary | ICD-10-CM

## 2019-07-28 DIAGNOSIS — J309 Allergic rhinitis, unspecified: Secondary | ICD-10-CM

## 2019-07-28 DIAGNOSIS — H6692 Otitis media, unspecified, left ear: Secondary | ICD-10-CM | POA: Insufficient documentation

## 2019-07-28 MED ORDER — GUAIFENESIN ER 600 MG PO TB12: 1200.0000 mg | ORAL_TABLET | Freq: Two times a day (BID) | ORAL | 5 refills | Status: DC | PRN

## 2019-07-28 MED ORDER — LEVOCETIRIZINE DIHYDROCHLORIDE 5 MG PO TABS: 5.0000 mg | ORAL_TABLET | Freq: Every evening | ORAL | 11 refills | Status: DC

## 2019-07-28 MED ORDER — TRIAMCINOLONE ACETONIDE 55 MCG/ACT NA AERO: 2.0000 | INHALATION_SPRAY | Freq: Every day | NASAL | 12 refills | Status: DC

## 2019-07-28 MED ORDER — LEVOFLOXACIN 500 MG PO TABS: 500.0000 mg | ORAL_TABLET | Freq: Every day | ORAL | 0 refills | Status: AC

## 2019-07-28 NOTE — Progress Notes (Signed)
Patient ID: Leslie Bryant, female   DOB: June 20, 1941, 78 y.o.   MRN: [id number]  Virtual Visit via Video Note  I connected with Leslie Bryant on 07/28/19 at 10:00 AM EDT by a video enabled telemedicine application and verified that I am speaking with the correct person using two identifiers.  Location: Patient: at home Provider: at office   I discussed the limitations of evaluation and management by telemedicine and the availability of in person appointments. The patient expressed understanding and agreed to proceed.  History of Present Illness:  Here with 2-3 days acute onset fever, left ear pain, pressure, headache, general weakness and malaise, and mild vertigo, but no hearing loss, high fever or chills, or ear discharge.  Pt denies new neurological symptoms such as new headache, or facial or extremity weakness or numbness   Pt denies polydipsia, polyuria, Pt denies chest pain, increased sob or doe, wheezing, orthopnea, PND, increased LE swelling, palpitations, dizziness or syncope.Also, Does have several wks ongoing nasal allergy symptoms with clearish congestion, itch and sneezing, without fever, pain, ST, cough, swelling or wheezing, but having run out of med a few wks ago Past Medical History:  Diagnosis Date  . Acute meniscal tear of knee LEFT KNEE  . Arthritis    "knees" (02/19/2017)  . Breast cancer, right breast ([place]) 1986  . 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 Diverticulitis of large intestine with perforation 11/02/2011   Microperforation probably related to nut and popcorn consumption.  Resolved by CT scan Recurrent episode clinical dx 07/2012   . Diverticulosis of colon    sigmoid  . GERD (gastroesophageal reflux disease)   . H/O hiatal hernia   . History of kidney stones    "passed it" (02/19/2017)  . Hypercholesterolemia    history  . Left knee pain    occasionally  . Palpitations IRREGULAR HEARTBEAT--  CONTROLLED W/  BETA BLOCKER  . Pneumonia 2017   "walking  pneumonia" (02/19/2017)  . Pre-diabetes    pcp is monitoring a1c currently 02/10/17  . Swelling of left knee joint    Past Surgical History:  Procedure Laterality Date  . BREAST BIOPSY Right 1986  . BREAST BIOPSY Left   . BREAST RECONSTRUCTION Right 1987   W/ IMPLANTS  . CARDIAC CATHETERIZATION  12-22-2000  DR BRACKBILL   NORMAL LVF/ NORMAL CORONARY ARTERIES  . CHOLECYSTECTOMY OPEN  1988  . CHONDROPLASTY  09/08/2012   Procedure: CHONDROPLASTY;  Surgeon: Tobi Bastos, MD;  Location: [facility];  Service: Orthopedics;  Laterality: Left;  . COLONOSCOPY     last colon 05-03-2009 (02/19/2017)  . COLONOSCOPY W/ BIOPSIES AND POLYPECTOMY    . KNEE ARTHROSCOPY WITH MEDIAL MENISECTOMY  09/08/2012   Procedure: KNEE ARTHROSCOPY WITH MEDIAL MENISECTOMY;  Surgeon: Tobi Bastos, MD;  Location: [place];  Service: Orthopedics;  Laterality: Left;  lateral tibial plateau,   . MASTECTOMY Right 1986   W/ MULTIPLE NODE DISSECTION  . REDUCTION MAMMAPLASTY Left 1987  . TOTAL KNEE ARTHROPLASTY Left 07/21/2013   Procedure: TOTAL KNEE ARTHROPLASTY;  Surgeon: Newt Minion, MD;  Location: [place];  Service: Orthopedics;  Laterality: Left;  Left Total Knee Arthroplasty  . TOTAL KNEE ARTHROPLASTY Right 02/19/2017  . TOTAL KNEE ARTHROPLASTY Right 02/19/2017   Procedure: RIGHT TOTAL KNEE ARTHROPLASTY;  Surgeon: Newt Minion, MD;  Location: [place];  Service: Orthopedics;  Laterality: Right;  Marland Kitchen VAGINAL HYSTERECTOMY  1966    reports that  she has never smoked. She has never used smokeless tobacco. She reports current alcohol use of about 1.0 standard drinks of alcohol per week. She reports that she does not use drugs. family history includes Brain cancer in her sister; Cancer in her brother; Colon cancer in her brother; Heart attack in her father; Heart failure in her sister; Leukemia in her brother; Lung cancer in her sister. Allergies  Allergen Reactions  . Crestor [Rosuvastatin  Calcium] Other (See Comments)    Shoulder pain  . Lipitor [Atorvastatin Calcium] Other (See Comments)    Myalgias   . Pravastatin Other (See Comments)    myalgias  . Penicillins Itching    Has patient had a PCN reaction causing immediate rash, facial/tongue/throat swelling, SOB or lightheadedness with hypotension: No Has patient had a PCN reaction causing severe rash involving mucus membranes or skin necrosis: No Has patient had a PCN reaction that required hospitalization No Has patient had a PCN reaction occurring within the last 10 years: No If all of the above answers are "NO", then may proceed with Cephalosporin use.    Current Outpatient Medications on File Prior to Visit  Medication Sig Dispense Refill  . aspirin 81 MG tablet Take 81 mg by mouth 2 (two) times daily.    Marland Kitchen BIOTIN 5000 PO Take 5,000 mcg by mouth daily.     . bumetanide (BUMEX) 1 MG tablet TAKE 1 TABLET (1 MG TOTAL) BY MOUTH DAILY AS NEEDED 90 tablet 1  . co-enzyme Q-10 30 MG capsule Take 30 mg by mouth 2 (two) times daily.     Marland Kitchen ezetimibe (ZETIA) 10 MG tablet Take 1 tablet by mouth once daily 90 tablet 1  . fish oil-omega-3 fatty acids 1000 MG capsule Take 1 g by mouth daily.     Marland Kitchen ibuprofen (ADVIL,MOTRIN) 200 MG tablet Take 400 mg by mouth 2 (two) times daily as needed for moderate pain.    . metFORMIN (GLUCOPHAGE-XR) 500 MG 24 hr tablet Take 1 tablet (500 mg total) by mouth daily with breakfast. 90 tablet 3  . metoprolol tartrate (LOPRESSOR) 50 MG tablet Take 1 tablet by mouth twice daily 180 tablet 0  . Multiple Vitamins-Minerals (CENTRUM SILVER PO) Take 1 tablet by mouth every morning.     Marland Kitchen omeprazole (PRILOSEC) 40 MG capsule Take 1 capsule (40 mg total) by mouth 2 (two) times daily before a meal. 180 capsule 3  . triamcinolone cream (KENALOG) 0.1 % APPLY TO AFFECTED AREA TWICE A DAY 30 g 0   No current facility-administered medications on file prior to visit.     Observations/Objective: Alert, NAD,  appropriate mood and affect, resps normal, cn 2-12 intact, moves all 4s, no visible rash or swelling Lab Results  Component Value Date   WBC 9.3 05/03/2019   HGB 11.7 (L) 05/03/2019   HCT 34.9 (L) 05/03/2019   PLT 315.0 05/03/2019   GLUCOSE 100 (H) 05/03/2019   CHOL 169 05/03/2019   TRIG 210.0 (H) 05/03/2019   HDL 46.70 05/03/2019   LDLDIRECT 112.0 05/03/2019   LDLCALC 85 02/09/2018   ALT 24 05/03/2019   AST 20 05/03/2019   NA 131 (L) 05/03/2019   K 4.3 05/03/2019   CL 96 05/03/2019   CREATININE 0.94 05/03/2019   BUN 16 05/03/2019   CO2 27 05/03/2019   TSH 1.46 05/03/2019   INR 1.01 07/13/2013   HGBA1C 6.6 (H) 05/03/2019   MICROALBUR <0.7 05/03/2019   Assessment and Plan: See notes  Follow Up Instructions:  See notes   I discussed the assessment and treatment plan with the patient. The patient was provided an opportunity to ask questions and all were answered. The patient agreed with the plan and demonstrated an understanding of the instructions.   The patient was advised to call back or seek an in-person evaluation if the symptoms worsen or if the condition fails to improve as anticipated.   [name], MD

## 2019-07-28 NOTE — Assessment & Plan Note (Signed)
stable overall by history and exam, recent data reviewed with pt, and pt to continue medical treatment as before,  to f/u any worsening symptoms or concerns  

## 2019-07-28 NOTE — Patient Instructions (Signed)
Please take all new medication as prescribed  Please continue all other medications as before, and refills have been done if requested.  Please have the pharmacy call with any other refills you may need.  Please continue your efforts at being more active, low cholesterol diet, and weight control.  Please keep your appointments with your specialists as you may have planned    

## 2019-07-28 NOTE — Assessment & Plan Note (Signed)
Mild to mod, for xyzal, nasaocrt and mucinex otc bid prn, to f/u any worsening symptoms or concerns

## 2019-07-28 NOTE — Assessment & Plan Note (Signed)
Mild to mod, for antibx course,  to f/u any worsening symptoms or concerns 

## 2019-08-04 ENCOUNTER — Other Ambulatory Visit: Payer: Self-pay | Admitting: Internal Medicine

## 2019-08-17 ENCOUNTER — Other Ambulatory Visit: Payer: Self-pay | Admitting: Internal Medicine

## 2019-09-06 ENCOUNTER — Encounter: Payer: Self-pay | Admitting: Internal Medicine

## 2019-09-06 ENCOUNTER — Ambulatory Visit (INDEPENDENT_AMBULATORY_CARE_PROVIDER_SITE_OTHER): Payer: Medicare Other | Admitting: Internal Medicine

## 2019-09-06 ENCOUNTER — Other Ambulatory Visit: Payer: Self-pay

## 2019-09-06 VITALS — BP 132/88 | HR 78 | Temp 98.4°F | Ht 62.0 in | Wt 165.0 lb

## 2019-09-06 DIAGNOSIS — H6692 Otitis media, unspecified, left ear: Secondary | ICD-10-CM | POA: Diagnosis not present

## 2019-09-06 DIAGNOSIS — J309 Allergic rhinitis, unspecified: Secondary | ICD-10-CM | POA: Diagnosis not present

## 2019-09-06 DIAGNOSIS — J019 Acute sinusitis, unspecified: Secondary | ICD-10-CM

## 2019-09-06 MED ORDER — CETIRIZINE HCL 10 MG PO TABS: 10.0000 mg | ORAL_TABLET | Freq: Every day | ORAL | 11 refills | Status: DC

## 2019-09-06 MED ORDER — DOXYCYCLINE HYCLATE 100 MG PO TABS: 100.0000 mg | ORAL_TABLET | Freq: Two times a day (BID) | ORAL | 0 refills | Status: DC

## 2019-09-06 NOTE — Patient Instructions (Signed)
Please take all new medication as prescribed - the antiibiotic, and the zyrtec  Please continue all other medications as before, including the nasacort  Please have the pharmacy call with any other refills you may need.  Please continue your efforts at being more active, low cholesterol diet, and weight control.  Please keep your appointments with your specialists as you may have planned

## 2019-09-06 NOTE — Progress Notes (Signed)
Subjective:    Patient ID: Leslie Bryant, female    DOB: 08-Apr-1941, 78 y.o.   MRN: [id number]  HPI   Here with 2-3 days acute onset fever, left ear and facial pain, pressure, headache, general weakness and malaise, and greenish d/c, with mild ST and cough, but pt denies chest pain, wheezing, increased sob or doe, orthopnea, PND, increased LE swelling, palpitations, dizziness or syncope.  Pain worse to turn the heaad too fast.  benedryl did ssem to help somewhat.  Nothing else makes better or worse.  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  Resolved by CT scan Recurrent episode clinical dx 07/2012    Diverticulosis of colon    sigmoid   GERD (gastroesophageal reflux disease)    H/O hiatal hernia    History of kidney stones    "passed it" (02/19/2017)   Hypercholesterolemia    history   Left knee pain    occasionally   Palpitations IRREGULAR HEARTBEAT--  CONTROLLED W/  BETA BLOCKER   Pneumonia 2017   "walking pneumonia" (02/19/2017)   Pre-diabetes    pcp is monitoring a1c currently 02/10/17   Swelling of left knee joint    Past Surgical History:  Procedure Laterality Date   BREAST BIOPSY Right 1986   BREAST BIOPSY Left    BREAST RECONSTRUCTION Right 1987   W/ IMPLANTS   CARDIAC CATHETERIZATION  12-22-2000  DR BRACKBILL   NORMAL LVF/ NORMAL CORONARY ARTERIES   CHOLECYSTECTOMY OPEN  1988   CHONDROPLASTY  09/08/2012   Procedure: CHONDROPLASTY;  Surgeon: Tobi Bastos, MD;  Location: [place];  Service: Orthopedics;  Laterality:  Left;   COLONOSCOPY     last colon 05-03-2009 (02/19/2017)   COLONOSCOPY W/ BIOPSIES AND POLYPECTOMY     KNEE ARTHROSCOPY WITH MEDIAL MENISECTOMY  09/08/2012   Procedure: KNEE ARTHROSCOPY WITH MEDIAL MENISECTOMY;  Surgeon: Tobi Bastos, MD;  Location: [place];  Service: Orthopedics;  Laterality: Left;  lateral tibial plateau,    MASTECTOMY Right 1986   W/ MULTIPLE NODE DISSECTION   REDUCTION MAMMAPLASTY Left 1987   TOTAL KNEE ARTHROPLASTY Left 07/21/2013   Procedure: TOTAL KNEE ARTHROPLASTY;  Surgeon: Newt Minion, MD;  Location: [place];  Service: Orthopedics;  Laterality: Left;  Left Total Knee Arthroplasty   TOTAL KNEE ARTHROPLASTY Right 02/19/2017   TOTAL KNEE ARTHROPLASTY Right 02/19/2017   Procedure: RIGHT TOTAL KNEE ARTHROPLASTY;  Surgeon: Newt Minion, MD;  Location: [place];  Service: Orthopedics;  Laterality: Right;   VAGINAL HYSTERECTOMY  1966    reports that she has never smoked. She has never used smokeless tobacco. She reports current alcohol use of about 1.0 standard drinks of alcohol per week. She reports that she does not use drugs. family history includes Brain cancer in her sister; Cancer in her brother; Colon cancer in her brother; Heart attack in her father; Heart failure in her sister; Leukemia in her brother; Lung cancer in her sister. Allergies  Allergen Reactions   Crestor [Rosuvastatin Calcium] Other (See Comments)  Shoulder pain   Lipitor [Atorvastatin Calcium] Other (See Comments)    Myalgias    Pravastatin Other (See Comments)    myalgias   Penicillins Itching    Has patient had a PCN reaction causing immediate rash, facial/tongue/throat swelling, SOB or lightheadedness with hypotension: No Has patient had a PCN reaction causing severe rash involving mucus membranes or skin necrosis: No Has patient had a PCN reaction that required hospitalization No Has patient had a PCN reaction occurring within the last 10 years: No If all  of the above answers are "NO", then may proceed with Cephalosporin use.    Current Outpatient Medications on File Prior to Visit  Medication Sig Dispense Refill   aspirin 81 MG tablet Take 81 mg by mouth 2 (two) times daily.     BIOTIN 5000 PO Take 5,000 mcg by mouth daily.      bumetanide (BUMEX) 1 MG tablet TAKE 1 TABLET (1 MG TOTAL) BY MOUTH DAILY AS NEEDED 90 tablet 1   co-enzyme Q-10 30 MG capsule Take 30 mg by mouth 2 (two) times daily.      ezetimibe (ZETIA) 10 MG tablet Take 1 tablet by mouth once daily 90 tablet 1   fish oil-omega-3 fatty acids 1000 MG capsule Take 1 g by mouth daily.      guaiFENesin (MUCINEX) 600 MG 12 hr tablet Take 2 tablets (1,200 mg total) by mouth 2 (two) times daily as needed. 40 tablet 5   ibuprofen (ADVIL,MOTRIN) 200 MG tablet Take 400 mg by mouth 2 (two) times daily as needed for moderate pain.     metFORMIN (GLUCOPHAGE-XR) 500 MG 24 hr tablet Take 1 tablet (500 mg total) by mouth daily with breakfast. 90 tablet 3   metoprolol tartrate (LOPRESSOR) 50 MG tablet Take 1 tablet by mouth twice daily 180 tablet 0   Multiple Vitamins-Minerals (CENTRUM SILVER PO) Take 1 tablet by mouth every morning.      omeprazole (PRILOSEC) 40 MG capsule TAKE 1 CAPSULE BY MOUTH TWICE DAILY BEFORE MEAL 180 capsule 0   triamcinolone (NASACORT) 55 MCG/ACT AERO nasal inhaler Place 2 sprays into the nose daily. 1 Inhaler 12   triamcinolone cream (KENALOG) 0.1 % APPLY TO AFFECTED AREA TWICE A DAY 30 g 0   No current facility-administered medications on file prior to visit.    Review of Systems  Constitutional: Negative for other unusual diaphoresis or sweats HENT: Negative for ear discharge or swelling Eyes: Negative for other worsening visual disturbances Respiratory: Negative for stridor or other swelling  Gastrointestinal: Negative for worsening distension or other blood Genitourinary: Negative for retention or other urinary change Musculoskeletal: Negative for  other MSK pain or swelling Skin: Negative for color change or other new lesions Neurological: Negative for worsening tremors and other numbness  Psychiatric/Behavioral: Negative for worsening agitation or other fatigue All otherwise neg per pt     Objective:   Physical Exam BP 132/88 (BP Location: Left Arm, Patient Position: Sitting, Cuff Size: Normal)    Pulse 78    Temp 98.4 F (36.9 C) (Oral)    Ht 5\' 2"  (1.575 m)    Wt 165 lb (74.8 kg)    SpO2 97%    BMI 30.18 kg/m  VS noted,  Constitutional: Pt appears in NAD HENT: Head: NCAT.  Right Ear: External ear normal.  Left Ear: External ear normal. Left TM with severe redness effusion Eyes: . Pupils are equal, round, and reactive to light. Conjunctivae and EOM are normal Nose: without  d/c or deformity Neck: Neck supple. Gross normal ROM Cardiovascular: Normal rate and regular rhythm.   Pulmonary/Chest: Effort normal and breath sounds without rales or wheezing.  Abd:  Soft, NT, ND, + BS, no organomegaly Neurological: Pt is alert. At baseline orientation, motor grossly intact Skin: Skin is warm. No rashes, other new lesions, no LE edema Psychiatric: Pt behavior is normal without agitation  All otherwise neg per pt Lab Results  Component Value Date   WBC 9.3 05/03/2019   HGB 11.7 (L) 05/03/2019   HCT 34.9 (L) 05/03/2019   PLT 315.0 05/03/2019   GLUCOSE 100 (H) 05/03/2019   CHOL 169 05/03/2019   TRIG 210.0 (H) 05/03/2019   HDL 46.70 05/03/2019   LDLDIRECT 112.0 05/03/2019   LDLCALC 85 02/09/2018   ALT 24 05/03/2019   AST 20 05/03/2019   NA 131 (L) 05/03/2019   K 4.3 05/03/2019   CL 96 05/03/2019   CREATININE 0.94 05/03/2019   BUN 16 05/03/2019   CO2 27 05/03/2019   TSH 1.46 05/03/2019   INR 1.01 07/13/2013   HGBA1C 6.6 (H) 05/03/2019   MICROALBUR <0.7 05/03/2019      Assessment & Plan:

## 2019-09-11 ENCOUNTER — Encounter: Payer: Self-pay | Admitting: Internal Medicine

## 2019-09-11 DIAGNOSIS — J019 Acute sinusitis, unspecified: Secondary | ICD-10-CM | POA: Insufficient documentation

## 2019-09-11 NOTE — Assessment & Plan Note (Signed)
Ritchie for contd benadryl prn

## 2019-09-11 NOTE — Assessment & Plan Note (Signed)
Mild to mod, for antibx course,  to f/u any worsening symptoms or concerns 

## 2019-10-08 DIAGNOSIS — M19042 Primary osteoarthritis, left hand: Secondary | ICD-10-CM | POA: Diagnosis not present

## 2019-10-08 DIAGNOSIS — M67431 Ganglion, right wrist: Secondary | ICD-10-CM | POA: Diagnosis not present

## 2019-10-08 DIAGNOSIS — M67441 Ganglion, right hand: Secondary | ICD-10-CM | POA: Insufficient documentation

## 2019-10-08 DIAGNOSIS — M79642 Pain in left hand: Secondary | ICD-10-CM | POA: Diagnosis not present

## 2019-10-08 DIAGNOSIS — M79641 Pain in right hand: Secondary | ICD-10-CM | POA: Diagnosis not present

## 2019-11-01 ENCOUNTER — Other Ambulatory Visit: Payer: Self-pay | Admitting: Internal Medicine

## 2019-11-09 ENCOUNTER — Telehealth: Payer: Self-pay | Admitting: Internal Medicine

## 2019-11-09 ENCOUNTER — Ambulatory Visit (INDEPENDENT_AMBULATORY_CARE_PROVIDER_SITE_OTHER): Payer: Medicare Other | Admitting: Internal Medicine

## 2019-11-09 ENCOUNTER — Other Ambulatory Visit: Payer: Self-pay

## 2019-11-09 ENCOUNTER — Encounter: Payer: Self-pay | Admitting: Internal Medicine

## 2019-11-09 VITALS — BP 132/80 | HR 86 | Temp 98.6°F | Resp 12 | Ht 62.0 in | Wt 166.0 lb

## 2019-11-09 DIAGNOSIS — K921 Melena: Secondary | ICD-10-CM | POA: Insufficient documentation

## 2019-11-09 DIAGNOSIS — R351 Nocturia: Secondary | ICD-10-CM

## 2019-11-09 DIAGNOSIS — I10 Essential (primary) hypertension: Secondary | ICD-10-CM

## 2019-11-09 DIAGNOSIS — R1030 Lower abdominal pain, unspecified: Secondary | ICD-10-CM | POA: Diagnosis not present

## 2019-11-09 DIAGNOSIS — E119 Type 2 diabetes mellitus without complications: Secondary | ICD-10-CM

## 2019-11-09 DIAGNOSIS — Z800 Family history of malignant neoplasm of digestive organs: Secondary | ICD-10-CM

## 2019-11-09 LAB — LIPID PANEL
Cholesterol: 197 mg/dL (ref 0–200)
HDL: 49.8 mg/dL (ref >39.00)
LDL Cholesterol: 116 mg/dL — ABNORMAL HIGH (ref 0–99)
NonHDL: 147.38
Total CHOL/HDL Ratio: 4
Triglycerides: 157 mg/dL — ABNORMAL HIGH (ref 0.0–149.0)
VLDL: 31.4 mg/dL (ref 0.0–40.0)

## 2019-11-09 LAB — URINALYSIS, ROUTINE W REFLEX MICROSCOPIC
Bilirubin Urine: NEGATIVE
Hgb urine dipstick: NEGATIVE
Ketones, ur: NEGATIVE
Leukocytes,Ua: NEGATIVE
Nitrite: NEGATIVE
RBC / HPF: NONE SEEN (ref 0–?)
Specific Gravity, Urine: 1.01 (ref 1.000–1.030)
Total Protein, Urine: NEGATIVE
Urine Glucose: NEGATIVE
Urobilinogen, UA: 0.2 (ref 0.0–1.0)
pH: 5 (ref 5.0–8.0)

## 2019-11-09 LAB — CBC WITH DIFFERENTIAL/PLATELET
Basophils Absolute: 0 10*3/uL (ref 0.0–0.1)
Basophils Relative: 0.3 % (ref 0.0–3.0)
Eosinophils Absolute: 0.3 10*3/uL (ref 0.0–0.7)
Eosinophils Relative: 3.3 % (ref 0.0–5.0)
HCT: 39.4 % (ref 36.0–46.0)
Hemoglobin: 13 g/dL (ref 12.0–15.0)
Lymphocytes Relative: 29.1 % (ref 12.0–46.0)
Lymphs Abs: 2.9 10*3/uL (ref 0.7–4.0)
MCHC: 33 g/dL (ref 30.0–36.0)
MCV: 93.3 fl (ref 78.0–100.0)
Monocytes Absolute: 0.9 10*3/uL (ref 0.1–1.0)
Monocytes Relative: 9.2 % (ref 3.0–12.0)
Neutro Abs: 5.8 10*3/uL (ref 1.4–7.7)
Neutrophils Relative %: 58.1 % (ref 43.0–77.0)
Platelets: 340 10*3/uL (ref 150.0–400.0)
RBC: 4.23 Mil/uL (ref 3.87–5.11)
RDW: 12.5 % (ref 11.5–15.5)
WBC: 10 10*3/uL (ref 4.0–10.5)

## 2019-11-09 LAB — HEPATIC FUNCTION PANEL
ALT: 36 U/L — ABNORMAL HIGH (ref 0–35)
AST: 27 U/L (ref 0–37)
Albumin: 4.3 g/dL (ref 3.5–5.2)
Alkaline Phosphatase: 61 U/L (ref 39–117)
Bilirubin, Direct: 0.1 mg/dL (ref 0.0–0.3)
Total Bilirubin: 0.3 mg/dL (ref 0.2–1.2)
Total Protein: 8.1 g/dL (ref 6.0–8.3)

## 2019-11-09 LAB — BASIC METABOLIC PANEL
BUN: 19 mg/dL (ref 6–23)
CO2: 24 mEq/L (ref 19–32)
Calcium: 10.1 mg/dL (ref 8.4–10.5)
Chloride: 100 mEq/L (ref 96–112)
Creatinine, Ser: 0.86 mg/dL (ref 0.40–1.20)
GFR: 63.6 mL/min (ref >60.00)
Glucose, Bld: 118 mg/dL — ABNORMAL HIGH (ref 70–99)
Potassium: 4.9 mEq/L (ref 3.5–5.1)
Sodium: 134 mEq/L — ABNORMAL LOW (ref 135–145)

## 2019-11-09 LAB — HEMOGLOBIN A1C: Hgb A1c MFr Bld: 6.7 % — ABNORMAL HIGH (ref 4.6–6.5)

## 2019-11-09 LAB — LIPASE: Lipase: 37 U/L (ref 11.0–59.0)

## 2019-11-09 MED ORDER — FESOTERODINE FUMARATE ER 4 MG PO TB24: 4.0000 mg | ORAL_TABLET | Freq: Every day | ORAL | 3 refills | Status: DC

## 2019-11-09 MED ORDER — SOLIFENACIN SUCCINATE 5 MG PO TABS: 5.0000 mg | ORAL_TABLET | Freq: Every day | ORAL | 3 refills | Status: DC

## 2019-11-09 NOTE — Telephone Encounter (Signed)
LVM--to call the office back regarding the medication. 

## 2019-11-09 NOTE — Progress Notes (Signed)
Subjective:    Patient ID: Leslie Bryant, female    DOB: 05-26-1941, 79 y.o.   MRN: [id number]  HPI  Here to f/u; overall doing ok,  Pt denies chest pain, increasing sob or doe, wheezing, orthopnea, PND, increased LE swelling, palpitations, dizziness or syncope.  Pt denies new neurological symptoms such as new headache, or facial or extremity weakness or numbness.  Pt denies polydipsia, polyuria, or low sugar episode.  Pt states overall good compliance with meds, mostly trying to follow appropriate diet.  Denies worsening reflux, dysphagia, n/v, or blood, and admits she has chronic abd pains but recently 1-2 wks worsening lower abd pain severity and frequency, mod to occasional severe, gaslike, intermittent crampy and bloating, several episodes n/v, and one episode small volume BRBPR and several non bloody loose stools; denies fever, wt loss or appetite loss. Mentions 01-May-2023 of siblings all died of cancer, one with colon ca.  Last colonoscopy per Dr Carlean Purl oct 2015.    Also Denies urinary symptoms such as dysuria, urgency, flank pain, hematuria or n/v, fever, chills but has 3-4 times per night nocturia. Past Medical History:  Diagnosis Date  . Acute meniscal tear of knee LEFT KNEE  . Arthritis    "knees" (02/19/2017)  . Breast cancer, right breast ([place]) 1986  . 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 Diverticulitis of large intestine with perforation 11/02/2011   Microperforation probably related to nut and popcorn consumption.  Resolved by CT scan Recurrent episode clinical dx 07/2012   . Diverticulosis of colon    sigmoid  . GERD (gastroesophageal reflux disease)   . H/O hiatal hernia   . History of kidney stones    "passed it" (02/19/2017)  . Hypercholesterolemia    history  . Left knee pain    occasionally  . Palpitations IRREGULAR HEARTBEAT--  CONTROLLED W/  BETA BLOCKER  . Pneumonia 2017   "walking pneumonia" (02/19/2017)  . Pre-diabetes    pcp is monitoring a1c currently 02/10/17  .  Swelling of left knee joint    Past Surgical History:  Procedure Laterality Date  . BREAST BIOPSY Right 1986  . BREAST BIOPSY Left   . BREAST RECONSTRUCTION Right 1987   W/ IMPLANTS  . CARDIAC CATHETERIZATION  12-22-2000  DR BRACKBILL   NORMAL LVF/ NORMAL CORONARY ARTERIES  . CHOLECYSTECTOMY OPEN  1988  . CHONDROPLASTY  09/08/2012   Procedure: CHONDROPLASTY;  Surgeon: Tobi Bastos, MD;  Location: [facility];  Service: Orthopedics;  Laterality: Left;  . COLONOSCOPY     last colon 05-03-2009 (02/19/2017)  . COLONOSCOPY W/ BIOPSIES AND POLYPECTOMY    . KNEE ARTHROSCOPY WITH MEDIAL MENISECTOMY  09/08/2012   Procedure: KNEE ARTHROSCOPY WITH MEDIAL MENISECTOMY;  Surgeon: Tobi Bastos, MD;  Location: [employer];  Service: Orthopedics;  Laterality: Left;  lateral tibial plateau,   . MASTECTOMY Right 1986   W/ MULTIPLE NODE DISSECTION  . REDUCTION MAMMAPLASTY Left 1987  . TOTAL KNEE ARTHROPLASTY Left 07/21/2013   Procedure: TOTAL KNEE ARTHROPLASTY;  Surgeon: Newt Minion, MD;  Location: [place];  Service: Orthopedics;  Laterality: Left;  Left Total Knee Arthroplasty  . TOTAL KNEE ARTHROPLASTY Right 02/19/2017  . TOTAL KNEE ARTHROPLASTY Right 02/19/2017   Procedure: RIGHT TOTAL KNEE ARTHROPLASTY;  Surgeon: Newt Minion, MD;  Location: [place];  Service: Orthopedics;  Laterality: Right;  Marland Kitchen VAGINAL HYSTERECTOMY  1966    reports that she has never smoked. She has  never used smokeless tobacco. She reports current alcohol use of about 1.0 standard drinks of alcohol per week. She reports that she does not use drugs. family history includes Brain cancer in her sister; Cancer in her brother; Colon cancer in her brother; Heart attack in her father; Heart failure in her sister; Leukemia in her brother; Lung cancer in her sister. Allergies  Allergen Reactions  . Crestor [Rosuvastatin Calcium] Other (See Comments)    Shoulder pain  . Lipitor [Atorvastatin Calcium] Other  (See Comments)    Myalgias   . Pravastatin Other (See Comments)    myalgias  . Penicillins Itching    Has patient had a PCN reaction causing immediate rash, facial/tongue/throat swelling, SOB or lightheadedness with hypotension: No Has patient had a PCN reaction causing severe rash involving mucus membranes or skin necrosis: No Has patient had a PCN reaction that required hospitalization No Has patient had a PCN reaction occurring within the last 10 years: No If all of the above answers are "NO", then may proceed with Cephalosporin use.    Current Outpatient Medications on File Prior to Visit  Medication Sig Dispense Refill  . aspirin 81 MG tablet Take 81 mg by mouth 2 (two) times daily.    Marland Kitchen BIOTIN 5000 PO Take 5,000 mcg by mouth daily.     . bumetanide (BUMEX) 1 MG tablet TAKE 1 TABLET (1 MG TOTAL) BY MOUTH DAILY AS NEEDED 90 tablet 1  . cetirizine (ZYRTEC) 10 MG tablet Take 1 tablet (10 mg total) by mouth daily. 30 tablet 11  . co-enzyme Q-10 30 MG capsule Take 30 mg by mouth 2 (two) times daily.     Marland Kitchen ezetimibe (ZETIA) 10 MG tablet Take 1 tablet by mouth once daily 90 tablet 1  . fish oil-omega-3 fatty acids 1000 MG capsule Take 1 g by mouth daily.     Marland Kitchen guaiFENesin (MUCINEX) 600 MG 12 hr tablet Take 2 tablets (1,200 mg total) by mouth 2 (two) times daily as needed. 40 tablet 5  . ibuprofen (ADVIL,MOTRIN) 200 MG tablet Take 400 mg by mouth 2 (two) times daily as needed for moderate pain.    . metFORMIN (GLUCOPHAGE-XR) 500 MG 24 hr tablet Take 1 tablet (500 mg total) by mouth daily with breakfast. 90 tablet 3  . metoprolol tartrate (LOPRESSOR) 50 MG tablet Take 1 tablet by mouth twice daily 180 tablet 0  . Multiple Vitamins-Minerals (CENTRUM SILVER PO) Take 1 tablet by mouth every morning.     Marland Kitchen omeprazole (PRILOSEC) 40 MG capsule TAKE 1 CAPSULE BY MOUTH TWICE DAILY BEFORE MEAL 180 capsule 0  . triamcinolone (NASACORT) 55 MCG/ACT AERO nasal inhaler Place 2 sprays into the nose daily. 1  Inhaler 12  . triamcinolone cream (KENALOG) 0.1 % APPLY TO AFFECTED AREA TWICE A DAY 30 g 0  . doxycycline (VIBRA-TABS) 100 MG tablet Take 1 tablet (100 mg total) by mouth 2 (two) times daily. (Patient not taking: Reported on 11/09/2019) 20 tablet 0   No current facility-administered medications on file prior to visit.   Review of Systems  Constitutional: Negative for other unusual diaphoresis or sweats HENT: Negative for ear discharge or swelling Eyes: Negative for other worsening visual disturbances Respiratory: Negative for stridor or other swelling  Gastrointestinal: Negative for worsening distension or other blood Genitourinary: Negative for retention or other urinary change Musculoskeletal: Negative for other MSK pain or swelling Skin: Negative for color change or other new lesions Neurological: Negative for worsening tremors and other numbness  Psychiatric/Behavioral: Negative for worsening agitation or other fatigue All otherwise neg per pt     Objective:   Physical Exam BP 132/80   Pulse 86   Temp 98.6 F (37 C)   Resp 12   Ht 5\' 2"  (1.575 m)   Wt 166 lb (75.3 kg)   SpO2 98%   BMI 30.36 kg/m  VS noted,  Constitutional: Pt appears in NAD HENT: Head: NCAT.  Right Ear: External ear normal.  Left Ear: External ear normal.  Eyes: . Pupils are equal, round, and reactive to light. Conjunctivae and EOM are normal,  Nose: without d/c or deformity Neck: Neck supple. Gross normal ROM Cardiovascular: Normal rate and regular rhythm.   Pulmonary/Chest: Effort normal and breath sounds without rales or wheezing.  Abd:  Soft, ND, + BS, no organomegaly with lower abd tender without guarding or rebound Neurological: Pt is alert. At baseline orientation, motor grossly intact Skin: Skin is warm. No rashes, other new lesions, no LE edema Psychiatric: Pt behavior is normal without agitation  All otherwise neg per pt  Lab Results  Component Value Date   WBC 9.3 05/03/2019   HGB 11.7  (L) 05/03/2019   HCT 34.9 (L) 05/03/2019   PLT 315.0 05/03/2019   GLUCOSE 100 (H) 05/03/2019   CHOL 169 05/03/2019   TRIG 210.0 (H) 05/03/2019   HDL 46.70 05/03/2019   LDLDIRECT 112.0 05/03/2019   LDLCALC 85 02/09/2018   ALT 24 05/03/2019   AST 20 05/03/2019   NA 131 (L) 05/03/2019   K 4.3 05/03/2019   CL 96 05/03/2019   CREATININE 0.94 05/03/2019   BUN 16 05/03/2019   CO2 27 05/03/2019   TSH 1.46 05/03/2019   INR 1.01 07/13/2013   HGBA1C 6.6 (H) 05/03/2019   MICROALBUR <0.7 05/03/2019       Assessment & Plan:

## 2019-11-09 NOTE — Telephone Encounter (Signed)
Please advise 

## 2019-11-09 NOTE — Patient Instructions (Signed)
Please take all new medication as prescribed - the vesicare for bladder  Please continue all other medications as before, and refills have been done if requested.  Please have the pharmacy call with any other refills you may need.  Please continue your efforts at being more active, low cholesterol diet, and weight control.  Please keep your appointments with your specialists as you may have planned  You will be contacted regarding the referral for: CT scan, and Dr Carlean Purl  Please go to the LAB at the blood drawing area for the tests to be done  You will be contacted by phone if any changes need to be made immediately.  Otherwise, you will receive a letter about your results with an explanation, but please check with MyChart first.  Please remember to sign up for MyChart if you have not done so, as this will be important to you in the future with finding out test results, communicating by private email, and scheduling acute appointments online when needed.

## 2019-11-09 NOTE — Telephone Encounter (Signed)
[place] for change to [place] 4 mg daily -   If this is not covered, please ask pt to ask pharmacist about which one similar is covered with her insurance

## 2019-11-09 NOTE — Telephone Encounter (Signed)
Copied from [place] [phone]. Topic: General - Other >> Nov 09, 2019  2:05 PM Keene Breath wrote: Reason for CRM: Pharmacy called to inform the doctor that patient's insurance will not cover the solifenacin (VESICARE) 5 MG tablet.  Please advise and give an alternative medication.  CB# [phone]

## 2019-11-10 DIAGNOSIS — Z0120 Encounter for dental examination and cleaning without abnormal findings: Secondary | ICD-10-CM | POA: Diagnosis not present

## 2019-11-10 LAB — URINE CULTURE: Result:: NO GROWTH

## 2019-11-10 NOTE — Telephone Encounter (Signed)
Pt called back. Office on other line.

## 2019-11-11 ENCOUNTER — Encounter: Payer: Self-pay | Admitting: Internal Medicine

## 2019-11-11 DIAGNOSIS — R351 Nocturia: Secondary | ICD-10-CM | POA: Insufficient documentation

## 2019-11-11 NOTE — Assessment & Plan Note (Addendum)
Etiology unclear, for lab studies, also CT abd/pelvis given change in symptoms r/o malignancy  Note:  Total time for pt hx, exam, review of record with pt in the room, determination of diagnoses and plan for further eval and tx is > 40 min, with over 50% spent in coordination and counseling of patient including the differential dx, tx, further evaluation and other management of lower abd pain, hematochezia, FH colon ca, DM, HTN

## 2019-11-11 NOTE — Assessment & Plan Note (Signed)
Likely OAB related, for vesicare asd,  to f/u any worsening symptoms or concerns

## 2019-11-11 NOTE — Assessment & Plan Note (Signed)
As above.

## 2019-11-11 NOTE — Assessment & Plan Note (Signed)
stable overall by history and exam, recent data reviewed with pt, and pt to continue medical treatment as before,  to f/u any worsening symptoms or concerns  

## 2019-11-11 NOTE — Assessment & Plan Note (Signed)
stable overall by history and exam, recent data reviewed with pt, and pt to continue medical treatment as before,  to f/u any worsening symptoms or concerns, for a1c with labs 

## 2019-11-11 NOTE — Assessment & Plan Note (Signed)
Etiology unclear, for cbc with labs, refer GI per pt request as very concerned about another colonoscopy

## 2019-11-12 NOTE — Telephone Encounter (Signed)
Left a detailed message for pt with MD response. Pt will call back if the Lisbeth Ply is not covered.

## 2019-11-18 ENCOUNTER — Other Ambulatory Visit: Payer: Self-pay

## 2019-11-18 ENCOUNTER — Telehealth: Payer: Self-pay | Admitting: Internal Medicine

## 2019-11-18 ENCOUNTER — Ambulatory Visit
Admission: RE | Admit: 2019-11-18 | Discharge: 2019-11-18 | Disposition: A | Discharge status: Home / Self Care | Payer: Medicare Other | Source: Ambulatory Visit | Attending: Internal Medicine | Admitting: Internal Medicine

## 2019-11-18 DIAGNOSIS — R1030 Lower abdominal pain, unspecified: Secondary | ICD-10-CM

## 2019-11-18 DIAGNOSIS — R109 Unspecified abdominal pain: Secondary | ICD-10-CM | POA: Diagnosis not present

## 2019-11-18 MED ORDER — IOPAMIDOL (ISOVUE-300) INJECTION 61%
100.0000 mL | Freq: Once | INTRAVENOUS | Status: AC | PRN
  Administered 2019-11-18: 100 mL via INTRAVENOUS

## 2019-11-18 NOTE — Telephone Encounter (Signed)
Copied from [place] [phone]. Topic: General - Inquiry >> Nov 18, 2019  3:23 PM Percell Belt A wrote: Reason for CRM: pt called in and stated she has an appt for the covid shot and would like to know if Jenny Reichmann would know if any reason why she should not get this vaccine ?   Best number  [phone]

## 2019-11-18 NOTE — Telephone Encounter (Signed)
No, she should absolutely get it, as for her the risk of the disease is much worse than the risk of the vaccine

## 2019-11-19 NOTE — Telephone Encounter (Signed)
Left message for patient to call back to advise of below.

## 2019-11-19 NOTE — Telephone Encounter (Signed)
Pt is aware of the below message from dr Jenny Reichmann

## 2019-11-23 DIAGNOSIS — H3561 Retinal hemorrhage, right eye: Secondary | ICD-10-CM | POA: Diagnosis not present

## 2019-11-23 DIAGNOSIS — H43813 Vitreous degeneration, bilateral: Secondary | ICD-10-CM | POA: Diagnosis not present

## 2019-11-23 DIAGNOSIS — H2513 Age-related nuclear cataract, bilateral: Secondary | ICD-10-CM | POA: Diagnosis not present

## 2019-11-23 DIAGNOSIS — H11122 Conjunctival concretions, left eye: Secondary | ICD-10-CM | POA: Diagnosis not present

## 2019-11-23 LAB — HM DIABETES EYE EXAM

## 2019-12-07 DIAGNOSIS — R519 Headache, unspecified: Secondary | ICD-10-CM | POA: Diagnosis not present

## 2019-12-07 DIAGNOSIS — H9202 Otalgia, left ear: Secondary | ICD-10-CM | POA: Diagnosis not present

## 2019-12-07 DIAGNOSIS — J343 Hypertrophy of nasal turbinates: Secondary | ICD-10-CM | POA: Diagnosis not present

## 2019-12-07 DIAGNOSIS — J310 Chronic rhinitis: Secondary | ICD-10-CM | POA: Diagnosis not present

## 2019-12-16 ENCOUNTER — Encounter: Payer: Self-pay | Admitting: Physician Assistant

## 2019-12-16 ENCOUNTER — Ambulatory Visit: Payer: Medicare Other | Admitting: Physician Assistant

## 2019-12-16 ENCOUNTER — Other Ambulatory Visit: Payer: Self-pay

## 2019-12-16 VITALS — BP 126/70 | HR 84 | Temp 98.4°F | Ht 60.5 in | Wt 166.0 lb

## 2019-12-16 DIAGNOSIS — R1084 Generalized abdominal pain: Secondary | ICD-10-CM | POA: Diagnosis not present

## 2019-12-16 DIAGNOSIS — R194 Change in bowel habit: Secondary | ICD-10-CM | POA: Diagnosis not present

## 2019-12-16 DIAGNOSIS — K921 Melena: Secondary | ICD-10-CM | POA: Diagnosis not present

## 2019-12-16 DIAGNOSIS — Z800 Family history of malignant neoplasm of digestive organs: Secondary | ICD-10-CM | POA: Diagnosis not present

## 2019-12-16 NOTE — Progress Notes (Signed)
Chief Complaint: Abdominal pain, hematochezia, history of polyps  HPI:    Leslie Bryant is a 79 year old female with a past medical history as listed below including family history of colon cancer and personal history of breast cancer, known to Dr. Carlean Purl, who was referred to me by Biagio Borg, MD for a complaint of abdominal pain, hematochezia and history of polyps.      08/18/2014 colonoscopy with Dr. Carlean Purl.  1 sessile polyp measuring 3 mm in the transverse colon, severe diverticulosis and otherwise normal.  Pathology showed hyperplastic polyp.  No repeat surveillance was recommended due to age.    11/09/2019 office visit with PCP.  At that time described 1 to 2 weeks of worsening lower abdominal pain, gas-like, crampy and bloated, several episodes of nausea and vomiting and one episode of small-volume bright red blood per rectum and several nonbloody loose stools.    11/09/2019 CBC normal, hemoglobin A1c 6.7.  BMP normal.  Hepatic function panel with an ALT minimally elevated at 36.    11/18/2019 CT abdomen pelvis with contrast with sigmoid diverticulosis and no evidence of diverticulitis and stable hepatic steatosis as well as aortic atherosclerosis.    Today, the patient presents to clinic and explains that for many many years she has had trouble with lower abdominal cramping resulting in urgent bowel movements typically after eating, but always felt like this was nothing she could really help after seeing physicians in our clinic previously.  Most recently though, over the past couple of months this has gotten more frequent and interferes with her life more often.  Tells me that normally she will have her coffee in the morning and have one solid stool but then all day long this is followed by abdominal cramping which makes her go to the bathroom and have softer than normal stool.  Explains that a couple of months ago she did see some bright red blood in a bowel movement which was mostly on the toilet  paper, she has seen none since then.  Describes this abdominal cramping is sometimes so severe, rated as an 8-9/10, that it makes her nauseous and occasionally she will end up vomiting at the same time as having a bowel movement.  Has never been on medicine for IBS, though was told that this was likely what it was years ago.    Describes family history of colon cancer in her brother diagnosed at the age of 85.  Social history positive for having a shitzu at home who "eats what I eat".    Denies fever, chills, weight loss or symptoms that awaken her from sleep.  Past Medical History:  Diagnosis Date  . Acute meniscal tear of knee LEFT KNEE  . Arthritis    "knees" (02/19/2017)  . Breast cancer, right breast ([place]) 1986  . 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 Diverticulitis of large intestine with perforation 11/02/2011   Microperforation probably related to nut and popcorn consumption.  Resolved by CT scan Recurrent episode clinical dx 07/2012   . Diverticulosis of colon    sigmoid  . GERD (gastroesophageal reflux disease)   . H/O hiatal hernia   . History of kidney stones    "passed it" (02/19/2017)  . Hypercholesterolemia    history  . Left knee pain    occasionally  . Palpitations IRREGULAR HEARTBEAT--  CONTROLLED W/  BETA BLOCKER  . Pneumonia 2017   "walking pneumonia" (02/19/2017)  . Pre-diabetes    pcp  is monitoring a1c currently 02/10/17  . Swelling of left knee joint     Past Surgical History:  Procedure Laterality Date  . BREAST BIOPSY Right 1986  . BREAST BIOPSY Left   . BREAST RECONSTRUCTION Right 1987   W/ IMPLANTS  . CARDIAC CATHETERIZATION  12-22-2000  DR BRACKBILL   NORMAL LVF/ NORMAL CORONARY ARTERIES  . CHOLECYSTECTOMY OPEN  1988  . CHONDROPLASTY  09/08/2012   Procedure: CHONDROPLASTY;  Surgeon: Tobi Bastos, MD;  Location: [facility];  Service: Orthopedics;  Laterality: Left;  . COLONOSCOPY     last colon 05-03-2009 (02/19/2017)  . COLONOSCOPY W/  BIOPSIES AND POLYPECTOMY    . KNEE ARTHROSCOPY WITH MEDIAL MENISECTOMY  09/08/2012   Procedure: KNEE ARTHROSCOPY WITH MEDIAL MENISECTOMY;  Surgeon: Tobi Bastos, MD;  Location: [place];  Service: Orthopedics;  Laterality: Left;  lateral tibial plateau,   . MASTECTOMY Right 1986   W/ MULTIPLE NODE DISSECTION  . REDUCTION MAMMAPLASTY Left 1987  . TOTAL KNEE ARTHROPLASTY Left 07/21/2013   Procedure: TOTAL KNEE ARTHROPLASTY;  Surgeon: Newt Minion, MD;  Location: [place];  Service: Orthopedics;  Laterality: Left;  Left Total Knee Arthroplasty  . TOTAL KNEE ARTHROPLASTY Right 02/19/2017  . TOTAL KNEE ARTHROPLASTY Right 02/19/2017   Procedure: RIGHT TOTAL KNEE ARTHROPLASTY;  Surgeon: Newt Minion, MD;  Location: [place] [place];  Service: Orthopedics;  Laterality: Right;  Marland Kitchen VAGINAL HYSTERECTOMY  1966    Current Outpatient Medications  Medication Sig Dispense Refill  . aspirin 81 MG tablet Take 81 mg by mouth 2 (two) times daily.    Marland Kitchen BIOTIN 5000 PO Take 5,000 mcg by mouth daily.     . bumetanide (BUMEX) 1 MG tablet TAKE 1 TABLET (1 MG TOTAL) BY MOUTH DAILY AS NEEDED 90 tablet 1  . cetirizine (ZYRTEC) 10 MG tablet Take 1 tablet (10 mg total) by mouth daily. 30 tablet 11  . co-enzyme Q-10 30 MG capsule Take 30 mg by mouth 2 (two) times daily.     Marland Kitchen doxycycline (VIBRA-TABS) 100 MG tablet Take 1 tablet (100 mg total) by mouth 2 (two) times daily. (Patient not taking: Reported on 11/09/2019) 20 tablet 0  . ezetimibe (ZETIA) 10 MG tablet Take 1 tablet by mouth once daily 90 tablet 1  . fesoterodine (TOVIAZ) 4 MG TB24 tablet Take 1 tablet (4 mg total) by mouth daily. 90 tablet 3  . fish oil-omega-3 fatty acids 1000 MG capsule Take 1 g by mouth daily.     Marland Kitchen guaiFENesin (MUCINEX) 600 MG 12 hr tablet Take 2 tablets (1,200 mg total) by mouth 2 (two) times daily as needed. 40 tablet 5  . ibuprofen (ADVIL,MOTRIN) 200 MG tablet Take 400 mg by mouth 2 (two) times daily as needed for moderate pain.    .  metFORMIN (GLUCOPHAGE-XR) 500 MG 24 hr tablet Take 1 tablet (500 mg total) by mouth daily with breakfast. 90 tablet 3  . metoprolol tartrate (LOPRESSOR) 50 MG tablet Take 1 tablet by mouth twice daily 180 tablet 0  . Multiple Vitamins-Minerals (CENTRUM SILVER PO) Take 1 tablet by mouth every morning.     Marland Kitchen omeprazole (PRILOSEC) 40 MG capsule TAKE 1 CAPSULE BY MOUTH TWICE DAILY BEFORE MEAL 180 capsule 0  . triamcinolone (NASACORT) 55 MCG/ACT AERO nasal inhaler Place 2 sprays into the nose daily. 1 Inhaler 12  . triamcinolone cream (KENALOG) 0.1 % APPLY TO AFFECTED AREA TWICE A DAY 30 g 0   No current  facility-administered medications for this visit.    Allergies as of 12/16/2019 - Review Complete 11/11/2019  Allergen Reaction Noted  . Crestor [rosuvastatin calcium] Other (See Comments) 02/06/2011  . Lipitor [atorvastatin calcium] Other (See Comments) 05/01/2011  . Pravastatin Other (See Comments) 05/01/2011  . Penicillins Itching 05/29/2007    Family History  Problem Relation Age of Onset  . Heart attack Father   . Heart failure Sister   . Colon cancer Brother        died/age 48  . Lung cancer Sister   . Brain cancer Sister   . Cancer Brother        mouth  . Leukemia Brother     Social History   Socioeconomic History  . Marital status: Married    Spouse name: Not on file  . Number of children: 2  . Years of education: Not on file  . Highest education level: Not on file  Occupational History  . Occupation: Retired  Tobacco Use  . Smoking status: Never Smoker  . Smokeless tobacco: Never Used  Substance and Sexual Activity  . Alcohol use: Yes    Alcohol/week: 1.0 standard drinks    Types: 1 Glasses of wine per week  . Drug use: No  . Sexual activity: Yes  Other Topics Concern  . Not on file  Social History Narrative  . Not on file   Social Determinants of Health   Financial Resource Strain:   . Difficulty of Paying Living Expenses: Not on file  Food Insecurity:    . Worried About Charity fundraiser in the Last Year: Not on file  . Ran Out of Food in the Last Year: Not on file  Transportation Needs:   . Lack of Transportation (Medical): Not on file  . Lack of Transportation (Non-Medical): Not on file  Physical Activity:   . Days of Exercise per Week: Not on file  . Minutes of Exercise per Session: Not on file  Stress:   . Feeling of Stress : Not on file  Social Connections:   . Frequency of Communication with Friends and Family: Not on file  . Frequency of Social Gatherings with Friends and Family: Not on file  . Attends Religious Services: Not on file  . Active Member of Clubs or Organizations: Not on file  . Attends Archivist Meetings: Not on file  . Marital Status: Not on file  Intimate Partner Violence:   . Fear of Current or Ex-Partner: Not on file  . Emotionally Abused: Not on file  . Physically Abused: Not on file  . Sexually Abused: Not on file    Review of Systems:    Constitutional: No weight loss, fever or chills Skin: No rash Cardiovascular: No chest pain Respiratory: No SOB  Gastrointestinal: See HPI and otherwise negative Genitourinary: No dysuria Neurological: No headache, dizziness or syncope Musculoskeletal: No new muscle or joint pain Hematologic: No bruising Psychiatric: No history of depression or anxiety   Physical Exam:  Vital signs: BP 126/70 (BP Location: Left Arm, Patient Position: Sitting, Cuff Size: Normal)   Pulse 84   Temp 98.4 F (36.9 C)   Ht 5' 0.5" (1.537 m) Comment: height measured without shoes  Wt 166 lb (75.3 kg)   BMI 31.89 kg/m   Constitutional:   Pleasant overweight Caucasian female appears to be in NAD, Well developed, Well nourished, alert and cooperative Head:  Normocephalic and atraumatic. Eyes:   PEERL, EOMI. No icterus. Conjunctiva pink. Ears:  Normal auditory acuity. Neck:  Supple Throat: Oral cavity and pharynx without inflammation, swelling or lesion.    Respiratory: Respirations even and unlabored. Lungs clear to auscultation bilaterally.   No wheezes, crackles, or rhonchi.  Cardiovascular: Normal S1, S2. No MRG. Regular rate and rhythm. No peripheral edema, cyanosis or pallor.  Gastrointestinal:  Soft, nondistended, mild RUQ ttp. No rebound or guarding. Normal bowel sounds. No appreciable masses or hepatomegaly. Rectal:  Not performed.  Msk:  Symmetrical without gross deformities. Without edema, no deformity or joint abnormality.  Neurologic:  Alert and  oriented x4;  grossly normal neurologically.  Skin:   Dry and intact without significant lesions or rashes. Psychiatric:  Demonstrates good judgement and reason without abnormal affect or behaviors.  MOST RECENT LABS AND IMAGING: CBC    Component Value Date/Time   WBC 10.0 11/09/2019 1109   RBC 4.23 11/09/2019 1109   HGB 13.0 11/09/2019 1109   HGB 12.0 07/08/2013 0957   HCT 39.4 11/09/2019 1109   HCT 35.0 07/08/2013 0957   PLT 340.0 11/09/2019 1109   PLT 330 07/08/2013 0957   MCV 93.3 11/09/2019 1109   MCV 92.5 07/08/2013 0957   MCH 30.1 12/13/2017 1621   MCHC 33.0 11/09/2019 1109   RDW 12.5 11/09/2019 1109   RDW 12.2 07/08/2013 0957   LYMPHSABS 2.9 11/09/2019 1109   LYMPHSABS 1.9 07/08/2013 0957   MONOABS 0.9 11/09/2019 1109   MONOABS 0.6 07/08/2013 0957   EOSABS 0.3 11/09/2019 1109   EOSABS 0.1 07/08/2013 0957   BASOSABS 0.0 11/09/2019 1109   BASOSABS 0.0 07/08/2013 0957    CMP     Component Value Date/Time   NA 134 (L) 11/09/2019 1109   NA 140 06/09/2013 1209   K 4.9 11/09/2019 1109   K 4.3 06/09/2013 1209   CL 100 11/09/2019 1109   CO2 24 11/09/2019 1109   CO2 25 06/09/2013 1209   GLUCOSE 118 (H) 11/09/2019 1109   GLUCOSE 89 06/09/2013 1209   BUN 19 11/09/2019 1109   BUN 18.6 06/09/2013 1209   CREATININE 0.86 11/09/2019 1109   CREATININE 0.73 12/22/2015 0916   CREATININE 0.8 06/09/2013 1209   CALCIUM 10.1 11/09/2019 1109   CALCIUM 9.3 06/09/2013 1209    PROT 8.1 11/09/2019 1109   PROT 7.1 06/09/2013 1209   ALBUMIN 4.3 11/09/2019 1109   ALBUMIN 3.5 06/09/2013 1209   AST 27 11/09/2019 1109   AST 19 06/09/2013 1209   ALT 36 (H) 11/09/2019 1109   ALT 21 06/09/2013 1209   ALKPHOS 61 11/09/2019 1109   ALKPHOS 67 06/09/2013 1209   BILITOT 0.3 11/09/2019 1109   BILITOT 0.25 06/09/2013 1209   GFRNONAA >60 12/13/2017 1621   GFRAA >60 12/13/2017 1621    Assessment: 1.  Generalized abdominal cramping pain: For many years, worsening over the past few months, recent CT unrevealing, typically results in a bowel movement; consider IBS versus other 2.  Change in bowel habits: Increase in softer/looser stools 3.  Hematochezia: 1 episode 2 to 3 months ago; most likely hemorrhoids 4.  Family history of colon cancer: In her brother diagnosed around the age of 39  Plan: 1.  Discussed with patient that likely her symptoms represent IBS, but with her family history of colon cancer and the fact that she saw some hematochezia would recommend she have a colonoscopy for further evaluation.  Scheduled this with Dr. Carlean Purl in the [facility].  Did discuss risks, benefits, limitations and alternatives and the patient agrees to proceed. 2.  Patient will be Covid tested 2 days prior to time of procedure. 3.  Briefly discussed IBS and recommendations, did provide her a handout of the low FODMAP diet.  Discussed this in detail with her.  Answered questions. 4.  Did discuss that she may benefit from an antispasmodic in the future such as Dicyclomine or Hyoscyamine.  Briefly discussed. 5.  Patient to follow in clinic per recommendations from Dr. Carlean Purl after time of procedure.  Ellouise Newer, PA-C [facility] 12/16/2019, 11:27 AM  Cc: Biagio Borg, MD

## 2019-12-16 NOTE — Patient Instructions (Addendum)
If you are age 80 or older, your body mass index should be between 23-30. Your Body mass index is 31.89 kg/m. If this is out of the aforementioned range listed, please consider follow up with your Primary Care Provider.  If you are age 65 or younger, your body mass index should be between 19-25. Your Body mass index is 31.89 kg/m. If this is out of the aformentioned range listed, please consider follow up with your Primary Care Provider.   You have been scheduled for a colonoscopy. Please follow written instructions given to you at your visit today.  Please pick up your prep supplies at the pharmacy within the next 1-3 days. If you use inhalers (even only as needed), please bring them with you on the day of your procedure. Your physician has requested that you go to www.startemmi.com and enter the access code given to you at your visit today. This web site gives a general overview about your procedure. However, you should still follow specific instructions given to you by our office regarding your preparation for the procedure.  It was a pleasure to see you today!  Ellouise Newer, PA- C

## 2019-12-30 ENCOUNTER — Telehealth: Payer: Self-pay | Admitting: Internal Medicine

## 2019-12-30 NOTE — Telephone Encounter (Signed)
New message:   Patient is calling and states she would like to discuss orders from Dr. Jenny Reichmann for her to have a colonoscopy. She has some questions and would like to talk to Dr. Jenny Reichmann if possible to discuss more in detail. Please advise.

## 2019-12-31 NOTE — Telephone Encounter (Signed)
Ok to hold off on the GI referral if symptoms have resolved

## 2019-12-31 NOTE — Telephone Encounter (Signed)
Contacted pt and she stated that see did the consultation with the NP and was place lactate free diet. Since doing the diet she has noticed positive changes and is not experiencing the same issues that she was before.   Pt question - should she proceed with the procedure?

## 2020-01-03 NOTE — Telephone Encounter (Signed)
Called patient informed of message, she stated that she feels a lot better and will call and cancel the appointment

## 2020-01-13 ENCOUNTER — Encounter: Payer: Medicare Other | Admitting: Internal Medicine

## 2020-01-31 ENCOUNTER — Other Ambulatory Visit: Payer: Self-pay | Admitting: Internal Medicine

## 2020-01-31 NOTE — Telephone Encounter (Signed)
Please refill as per office routine med refill policy (all routine meds refilled for 3 mo or monthly per pt preference up to one year from last visit, then month to month grace period for 3 mo, then further med refills will have to be denied)  

## 2020-02-03 ENCOUNTER — Emergency Department (HOSPITAL_COMMUNITY): Payer: Medicare Other

## 2020-02-03 ENCOUNTER — Emergency Department (HOSPITAL_COMMUNITY)
Admission: EM | Admit: 2020-02-03 | Discharge: 2020-02-03 | Disposition: A | Discharge status: Home / Self Care | Payer: Medicare Other | Attending: Emergency Medicine | Admitting: Emergency Medicine

## 2020-02-03 ENCOUNTER — Encounter (HOSPITAL_COMMUNITY): Payer: Self-pay | Admitting: Emergency Medicine

## 2020-02-03 ENCOUNTER — Other Ambulatory Visit: Payer: Self-pay

## 2020-02-03 DIAGNOSIS — J810 Acute pulmonary edema: Secondary | ICD-10-CM | POA: Insufficient documentation

## 2020-02-03 DIAGNOSIS — I10 Essential (primary) hypertension: Secondary | ICD-10-CM | POA: Insufficient documentation

## 2020-02-03 DIAGNOSIS — R600 Localized edema: Secondary | ICD-10-CM | POA: Diagnosis not present

## 2020-02-03 DIAGNOSIS — Z7982 Long term (current) use of aspirin: Secondary | ICD-10-CM | POA: Diagnosis not present

## 2020-02-03 DIAGNOSIS — E119 Type 2 diabetes mellitus without complications: Secondary | ICD-10-CM | POA: Diagnosis not present

## 2020-02-03 DIAGNOSIS — Z96653 Presence of artificial knee joint, bilateral: Secondary | ICD-10-CM | POA: Insufficient documentation

## 2020-02-03 DIAGNOSIS — R0602 Shortness of breath: Secondary | ICD-10-CM | POA: Diagnosis not present

## 2020-02-03 LAB — CBC WITH DIFFERENTIAL/PLATELET
Abs Immature Granulocytes: 0.03 10*3/uL (ref 0.00–0.07)
Basophils Absolute: 0 10*3/uL (ref 0.0–0.1)
Basophils Relative: 1 %
Eosinophils Absolute: 0.3 10*3/uL (ref 0.0–0.5)
Eosinophils Relative: 4 %
HCT: 35.7 % — ABNORMAL LOW (ref 36.0–46.0)
Hemoglobin: 11.5 g/dL — ABNORMAL LOW (ref 12.0–15.0)
Immature Granulocytes: 0 %
Lymphocytes Relative: 40 %
Lymphs Abs: 3.4 10*3/uL (ref 0.7–4.0)
MCH: 31.2 pg (ref 26.0–34.0)
MCHC: 32.2 g/dL (ref 30.0–36.0)
MCV: 96.7 fL (ref 80.0–100.0)
Monocytes Absolute: 0.9 10*3/uL (ref 0.1–1.0)
Monocytes Relative: 10 %
Neutro Abs: 3.8 10*3/uL (ref 1.7–7.7)
Neutrophils Relative %: 45 %
Platelets: 300 10*3/uL (ref 150–400)
RBC: 3.69 MIL/uL — ABNORMAL LOW (ref 3.87–5.11)
RDW: 12.6 % (ref 11.5–15.5)
WBC: 8.4 10*3/uL (ref 4.0–10.5)
nRBC: 0 % (ref 0.0–0.2)

## 2020-02-03 LAB — BASIC METABOLIC PANEL
Anion gap: 12 (ref 5–15)
BUN: 18 mg/dL (ref 8–23)
CO2: 24 mmol/L (ref 22–32)
Calcium: 9.2 mg/dL (ref 8.9–10.3)
Chloride: 102 mmol/L (ref 98–111)
Creatinine, Ser: 1.31 mg/dL — ABNORMAL HIGH (ref 0.44–1.00)
GFR calc Af Amer: 45 mL/min — ABNORMAL LOW (ref >60)
GFR calc non Af Amer: 39 mL/min — ABNORMAL LOW (ref >60)
Glucose, Bld: 120 mg/dL — ABNORMAL HIGH (ref 70–99)
Potassium: 4.1 mmol/L (ref 3.5–5.1)
Sodium: 138 mmol/L (ref 135–145)

## 2020-02-03 LAB — TROPONIN I (HIGH SENSITIVITY): Troponin I (High Sensitivity): 12 ng/L (ref <18)

## 2020-02-03 LAB — BRAIN NATRIURETIC PEPTIDE: B Natriuretic Peptide: 156.1 pg/mL — ABNORMAL HIGH (ref 0.0–100.0)

## 2020-02-03 MED ORDER — FUROSEMIDE 40 MG PO TABS: 40.0000 mg | ORAL_TABLET | Freq: Every day | ORAL | 0 refills | Status: DC

## 2020-02-03 MED ORDER — FUROSEMIDE 10 MG/ML IJ SOLN
40.0000 mg | Freq: Once | INTRAMUSCULAR | Status: AC
  Administered 2020-02-03: 40 mg via INTRAVENOUS
  Filled 2020-02-03: qty 4

## 2020-02-03 NOTE — ED Triage Notes (Signed)
Pt c/o intermittent shortness of breath x 1 week, worsening tonight. Denies chest pain.

## 2020-02-03 NOTE — Discharge Instructions (Signed)
You can be discharged home tonight and should plan to see Dr. Debara Pickett at the first available time.   Stop taking Bumex and take Lasix daily.   If your symptoms worsen, or you develop new symptoms of concern - chest pain, high fever, vomiting - return to the emergency department for further management.

## 2020-02-03 NOTE — ED Provider Notes (Signed)
[facility] EMERGENCY DEPARTMENT Provider Note   CSN: [id number] Arrival date & time: 02/03/20  0026     History Chief Complaint  Patient presents with  . Shortness of Breath    Leslie Bryant is a 79 y.o. female.  Patient to ED for evaluation of shortness of breath. She reports symptoms occur when she gets up to walk and she is asymptomatic at rest. No chest pain, nausea, vomiting, cough or fever. She denies LE swelling or history of congestive heart failure. Her symptoms started 2 weeks ago and have been getting progressively worse.   The history is provided by the patient. No language interpreter was used.  Shortness of Breath Associated symptoms: no abdominal pain, no chest pain, no cough and no fever        Past Medical History:  Diagnosis Date  . Acute meniscal tear of knee LEFT KNEE  . Allergic rhinitis   . Arthritis    "knees" (02/19/2017)  . Atrial fibrillation ([place])   . Breast cancer, right breast ([place]) 1986  . 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 Diverticulitis of large intestine with perforation 11/02/2011   Microperforation probably related to nut and popcorn consumption.  Resolved by CT scan Recurrent episode clinical dx 07/2012   . Diverticulosis of colon    sigmoid  . GERD (gastroesophageal reflux disease)   . H/O hiatal hernia   . History of kidney stones    "passed it" (02/19/2017)  . Hypercholesterolemia    history  . Left knee pain    occasionally  . Palpitations IRREGULAR HEARTBEAT--  CONTROLLED W/  BETA BLOCKER  . Pneumonia 2017   "walking pneumonia" (02/19/2017)  . Pre-diabetes    pcp is monitoring a1c currently 02/10/17  . Swelling of left knee joint     Patient Active Problem List   Diagnosis Date Noted  . Nocturia 11/11/2019  . Hematochezia 11/09/2019  . Acute sinus infection 09/11/2019  . Left otitis media 07/28/2019  . Venous insufficiency 02/09/2018  . Chest pain 09/22/2017  . Neck pain 09/22/2017  . Abdominal  pain, left lower quadrant 04/03/2017  . Diarrhea 04/03/2017  . Weight loss 04/03/2017  . Total knee replacement status, right 02/19/2017  . Bilateral otitis media with effusion 01/23/2017  . Preoperative cardiovascular examination 12/24/2016  . Hyponatremia 11/06/2016  . Diabetes ([place]) 11/06/2016  . Dysuria 04/30/2016  . Plantar fasciitis of right foot 12/28/2014  . Metatarsal deformity 12/28/2014  . Unspecified deficiency anemia 06/09/2013  . Anemia, unspecified 05/06/2013  . Lower back pain 02/01/2013  . Osteoarthritis of left knee 09/08/2012  . Complex tear of medial meniscus of left knee as current injury 09/08/2012  . Lower abdominal pain 07/23/2012  . Preventative health care 05/05/2012  . Family hx of colon cancer 05/05/2012  . Unilateral primary osteoarthritis, right knee 02/06/2011  . History of breast cancer 02/06/2011  . Palpitations   . Supraventricular tachycardia ([place])   . EUSTACHIAN TUBE DYSFUNCTION, LEFT 04/11/2010  . Anxiety state 05/05/2008  . Essential hypertension 05/05/2008  . Allergic rhinitis 05/05/2008  . IBS 05/05/2008  . OSTEOPENIA 05/05/2008  . COLONIC POLYPS, HX OF 05/05/2008  . Hyperlipidemia 05/29/2007  . GERD 05/29/2007    Past Surgical History:  Procedure Laterality Date  . BREAST BIOPSY Right 1986  . BREAST BIOPSY Left   . BREAST RECONSTRUCTION Right 1987   W/ IMPLANTS  . CARDIAC CATHETERIZATION  12-22-2000  DR BRACKBILL   NORMAL LVF/ NORMAL  CORONARY ARTERIES  . CHOLECYSTECTOMY OPEN  1988  . CHONDROPLASTY  09/08/2012   Procedure: CHONDROPLASTY;  Surgeon: Tobi Bastos, MD;  Location: [facility];  Service: Orthopedics;  Laterality: Left;  . COLONOSCOPY     last colon 05-03-2009 (02/19/2017)  . COLONOSCOPY W/ BIOPSIES AND POLYPECTOMY    . KNEE ARTHROSCOPY WITH MEDIAL MENISECTOMY  09/08/2012   Procedure: KNEE ARTHROSCOPY WITH MEDIAL MENISECTOMY;  Surgeon: Tobi Bastos, MD;  Location: [facility];   Service: Orthopedics;  Laterality: Left;  lateral tibial plateau,   . MASTECTOMY Right 1986   W/ MULTIPLE NODE DISSECTION  . REDUCTION MAMMAPLASTY Left 1987  . TOTAL KNEE ARTHROPLASTY Left 07/21/2013   Procedure: TOTAL KNEE ARTHROPLASTY;  Surgeon: Newt Minion, MD;  Location: [facility];  Service: Orthopedics;  Laterality: Left;  Left Total Knee Arthroplasty  . TOTAL KNEE ARTHROPLASTY Right 02/19/2017   Procedure: RIGHT TOTAL KNEE ARTHROPLASTY;  Surgeon: Newt Minion, MD;  Location: [facility];  Service: Orthopedics;  Laterality: Right;  Marland Kitchen VAGINAL HYSTERECTOMY  1966     OB History   No obstetric history on file.     Family History  Problem Relation Age of Onset  . Heart attack Father   . Heart failure Mother        CHF  . Dementia Maternal Grandfather   . Dementia Sister   . Colon cancer Brother        died/age 24  . Lung cancer Sister   . Brain cancer Sister   . Cancer Brother        mouth  . Leukemia Brother   . Cancer Sister        type unknown  . Cancer Sister        type unknown  . Heart disease Daughter     Social History   Tobacco Use  . Smoking status: Never Smoker  . Smokeless tobacco: Never Used  Substance Use Topics  . Alcohol use: Yes    Alcohol/week: 1.0 standard drinks    Types: 1 Glasses of wine per week    Comment: social wine once a month  . Drug use: No    Home Medications Prior to Admission medications   Medication Sig Start Date End Date Taking? Authorizing Provider  aspirin 81 MG tablet Take 81 mg by mouth 2 (two) times daily.    [provider]  BIOTIN 5000 PO Take 5,000 mcg by mouth daily.     [provider]  bumetanide (BUMEX) 1 MG tablet TAKE 1 TABLET (1 MG TOTAL) BY MOUTH DAILY AS NEEDED 05/18/18   Biagio Borg, MD  co-enzyme Q-10 30 MG capsule Take 30 mg by mouth 2 (two) times daily.     [provider]  ezetimibe (ZETIA) 10 MG tablet Take 1 tablet by mouth once daily 08/17/19   Hilty, Nadean Corwin, MD  fish  oil-omega-3 fatty acids 1000 MG capsule Take 1 g by mouth daily.     [provider]  ibuprofen (ADVIL,MOTRIN) 200 MG tablet Take 400 mg by mouth 2 (two) times daily as needed for moderate pain.    [provider]  methocarbamol (ROBAXIN) 750 MG tablet Take 1 tablet by mouth daily. 12/07/19   [provider]  metoprolol tartrate (LOPRESSOR) 50 MG tablet Take 1 tablet by mouth twice daily 02/01/20   Biagio Borg, MD  Multiple Vitamins-Minerals (CENTRUM SILVER PO) Take 1 tablet by mouth every morning.  [provider]  omeprazole (PRILOSEC) 40 MG capsule TAKE 1 CAPSULE BY MOUTH TWICE DAILY BEFORE MEAL(S) 02/01/20   Hilty, Nadean Corwin, MD  triamcinolone (NASACORT) 55 MCG/ACT AERO nasal inhaler Place 2 sprays into the nose daily. 07/28/19   Biagio Borg, MD    Allergies    Crestor [rosuvastatin calcium], Lipitor [atorvastatin calcium], Pravachol [pravastatin], and Penicillins  Review of Systems   Review of Systems  Constitutional: Negative for chills and fever.  HENT: Negative.   Respiratory: Positive for shortness of breath. Negative for cough.   Cardiovascular: Negative.  Negative for chest pain and leg swelling.  Gastrointestinal: Negative.  Negative for abdominal pain and nausea.  Musculoskeletal: Negative.  Negative for myalgias.  Skin: Negative.   Neurological: Negative.     Physical Exam Updated Vital Signs BP (!) 156/74 (BP Location: Left Arm)   Pulse 91   Temp 97.6 F (36.4 C) (Oral)   Resp 16   SpO2 98%   Physical Exam Vitals and nursing note reviewed.  Constitutional:      Appearance: She is well-developed.  HENT:     Head: Normocephalic.  Cardiovascular:     Rate and Rhythm: Normal rate and regular rhythm.  Pulmonary:     Effort: Pulmonary effort is normal. No tachypnea or respiratory distress.     Breath sounds: Rales present.  Chest:     Chest wall: No tenderness.  Abdominal:     General: Bowel sounds are normal.      Palpations: Abdomen is soft.     Tenderness: There is no abdominal tenderness. There is no guarding or rebound.  Musculoskeletal:        General: Normal range of motion.     Cervical back: Normal range of motion and neck supple.     Right lower leg: No edema.     Left lower leg: No edema.  Skin:    General: Skin is warm and dry.     Findings: No rash.  Neurological:     Mental Status: She is alert and oriented to person, place, and time.     ED Results / Procedures / Treatments   Labs (all labs ordered are listed, but only abnormal results are displayed) Labs Reviewed - No data to display Results for orders placed or performed during the hospital encounter of 02/03/20  CBC with Differential  Result Value Ref Range   WBC 8.4 4.0 - 10.5 K/uL   RBC 3.69 (L) 3.87 - 5.11 MIL/uL   Hemoglobin 11.5 (L) 12.0 - 15.0 g/dL   HCT 35.7 (L) 36.0 - 46.0 %   MCV 96.7 80.0 - 100.0 fL   MCH 31.2 26.0 - 34.0 pg   MCHC 32.2 30.0 - 36.0 g/dL   RDW 12.6 11.5 - 15.5 %   Platelets 300 150 - 400 K/uL   nRBC 0.0 0.0 - 0.2 %   Neutrophils Relative % 45 %   Neutro Abs 3.8 1.7 - 7.7 K/uL   Lymphocytes Relative 40 %   Lymphs Abs 3.4 0.7 - 4.0 K/uL   Monocytes Relative 10 %   Monocytes Absolute 0.9 0.1 - 1.0 K/uL   Eosinophils Relative 4 %   Eosinophils Absolute 0.3 0.0 - 0.5 K/uL   Basophils Relative 1 %   Basophils Absolute 0.0 0.0 - 0.1 K/uL   Immature Granulocytes 0 %   Abs Immature Granulocytes 0.03 0.00 - 0.07 K/uL  Basic metabolic panel  Result Value Ref Range   Sodium 138 135 -  145 mmol/L   Potassium 4.1 3.5 - 5.1 mmol/L   Chloride 102 98 - 111 mmol/L   CO2 24 22 - 32 mmol/L   Glucose, Bld 120 (H) 70 - 99 mg/dL   BUN 18 8 - 23 mg/dL   Creatinine, Ser 1.31 (H) 0.44 - 1.00 mg/dL   Calcium 9.2 8.9 - 10.3 mg/dL   GFR calc non Af Amer 39 (L) >60 mL/min   GFR calc Af Amer 45 (L) >60 mL/min   Anion gap 12 5 - 15  Brain natriuretic peptide  Result Value Ref Range   B Natriuretic Peptide  156.1 (H) 0.0 - 100.0 pg/mL  Troponin I (High Sensitivity)  Result Value Ref Range   Troponin I (High Sensitivity) 12 <18 ng/L    EKG EKG Interpretation  Date/Time:  Thursday February 03 2020 00:27:08 EDT Ventricular Rate:  92 PR Interval:  194 QRS Duration: 70 QT Interval:  362 QTC Calculation: 447 R Axis:   52 Text Interpretation: Normal sinus rhythm Confirmed by Randal Buba, April ([id number]) on 02/03/2020 1:18:46 AM   Radiology DG Chest 2 View  Result Date: 02/03/2020 CLINICAL DATA:  Shortness of breath EXAM: CHEST - 2 VIEW COMPARISON:  12/13/2017 FINDINGS: Increased interstitial markings, favoring mild interstitial edema. However, there is no definite pleural effusion. No pneumothorax. The heart is normal in size. Postsurgical changes involving the right breast with right axillary lymph node dissection. IMPRESSION: Suspected mild interstitial edema.  No definite pleural effusions. Electronically Signed   By: Julian Hy M.D.   On: 02/03/2020 00:55    Procedures Procedures (including critical care time)  Medications Ordered in ED Medications - No data to display  ED Course  I have reviewed the triage vital signs and the nursing notes.  Pertinent labs & imaging results that were available during my care of the patient were reviewed by me and considered in my medical decision making (see chart for details).    MDM Rules/Calculators/A&P                      Patient to ED with DOB getting worse over 2 weeks as detailed in the HPI.   She is well appearing and in NAD. No tachycardia, hypoxia, tachypnea or sign of respiratory distress. There is no peripheral edema. CXR concerning for mild interstitial edema without definite pleural effusions. Labs pending.   IV Lasix provided and she has started to diurese. EKG nonischemic. BNP minimally elevated at 156. Troponin negative at 12. She has been seen by Dr. Randal Buba.   On chart review, the patient is on Bumex, 1 mg prn. She reports she  has taken this twice in the last week but symptoms continue to progress.   Will ambulate with pulse ox. If stable, feel she can be discharged home. Discussed with pharmacy. Will change Bumex to daily Lasix 40 mg. She is established with cardiology, Dr. Debara Pickett and will be referred back to him for outpatient follow up.   Patient is ambulated and feels better. No decrease in O2 saturation. She can be discharged home. Final Clinical Impression(s) / ED Diagnoses Final diagnoses:  None   1. Pulmonary edema  Rx / DC Orders ED Discharge Orders    None       Dennie Bible 02/08/20 [id number]    Palumbo, April, MD 02/13/20 0017

## 2020-02-04 ENCOUNTER — Ambulatory Visit: Payer: Medicare Other | Admitting: Internal Medicine

## 2020-02-04 ENCOUNTER — Encounter: Payer: Self-pay | Admitting: Internal Medicine

## 2020-02-04 VITALS — BP 120/70 | HR 91 | Temp 97.2°F | Ht 62.0 in | Wt 164.0 lb

## 2020-02-04 DIAGNOSIS — E782 Mixed hyperlipidemia: Secondary | ICD-10-CM | POA: Diagnosis not present

## 2020-02-04 DIAGNOSIS — R0602 Shortness of breath: Secondary | ICD-10-CM

## 2020-02-04 DIAGNOSIS — R7989 Other specified abnormal findings of blood chemistry: Secondary | ICD-10-CM | POA: Diagnosis not present

## 2020-02-04 NOTE — Progress Notes (Signed)
OFFICE NOTE  Chief Complaint:  Shortness of breath  Primary Care Physician: Biagio Borg, MD  HPI:  Leslie Bryant is a 79 y.o. female who is a former patient of Dr. Mare Ferrari. She is followed along with him for a number years and he also followed her husband who unfortunately died. She has no known coronary artery disease. She had heart catheterization 2002 which showed essentially normal coronaries. She's been followed for palpitations, history of PSVT which is been well controlled and dyslipidemia. Recently she was seen in the emergency department after working strenuously in the yard on a very hot day. She felt some fluttering in her chest and was noted to have some unifocal PVCs on a monitor. Recently her metoprolol was increased to 50 mg twice a day. She was advised to decrease caffeine intake. Since that time she's had no further recurrence and felt like it may be related to working outside in the hot weather. Blood pressure is well-controlled today. She has a history of intolerance to statins. Recently she had had some pain in her legs and thought it was related to study of. She was taken off that medicine and listed as an allergy however her symptoms did not change therefore she is interested in going back on the medicine. While she was on Zetia her LDL was 120 which is reasonable control.   12/24/2016  Leslie Bryant returns a for follow-up. Overall she seems to be doing well although suffering from some right knee pain. She told me that she intends to have right knee surgery hopefully in April with Dr. Sharol Given. She is asking for cardiovascular clearance. She denies any chest pain or worsening shortness of breath. Her blood pressure is well-controlled. She has been tolerant of ezetimibe 10 mg daily for cholesterol and recently her LDL was less than 80. She's previous he been intolerant of pravastatin, atorvastatin and rosuvastatin.  12/17/2017  Leslie Bryant is seen today in follow-up.  She  was recently in the emergency department beginning of February with some palpitations.  She also was describing some chest pain.  It was felt that this was more likely acid reflux.  Her PPI was doubled and her symptoms have improved.  She was noted to have PVCs, but this is known in her history.  She says her palpitations have improved and may be a lot related to stress.  We discussed possibility of using extra metoprolol or increasing her dose if necessary.  05/24/2019  Leslie Bryant is seen today in follow-up.  Overall she reports good control in her palpitations after we further increased her metoprolol to 50 mg twice daily.  Blood pressures generally in the 811-914 systolic range however was elevated this morning.  She reported she did not take her beta-blocker this morning.  She has normal sinus rhythm on EKG at 91.  She denies any chest pain or shortness of breath.  02/04/2020  Leslie Bryant returns today for a follow-up.  This is an acute visit as she was seen in the ER yesterday for shortness of breath.  She says has been building for about a couple weeks and was noted to be somewhat orthopneic requiring an extra pillow to elevate her head at night, short of breath and having a mild nonproductive cough.  In the ER she was found to have some mild interstitial edema on her chest x-ray and mildly elevated BNP just above 150.  She was placed on Lasix 40 mg daily and told to  follow-up with me today.  She reports perhaps a mild improvement in her breathing and weight is noted to be down a couple of pounds.  She denies any chest pain.  She has had both Covid vaccines and does not report any known Covid exposure.  This was not tested.  She also had a negative troponin.  She also had recent lipids showing total cholesterol 197, triglycerides 157, HDL 49 and LDL 116, somewhat improved only on ezetimibe monotherapy.  PMHx:  Past Medical History:  Diagnosis Date  . Acute meniscal tear of knee LEFT KNEE  . Allergic  rhinitis   . Arthritis    "knees" (02/19/2017)  . Atrial fibrillation ([place])   . Breast cancer, right breast ([place]) 1986  . 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 Diverticulitis of large intestine with perforation 11/02/2011   Microperforation probably related to nut and popcorn consumption.  Resolved by CT scan Recurrent episode clinical dx 07/2012   . Diverticulosis of colon    sigmoid  . GERD (gastroesophageal reflux disease)   . H/O hiatal hernia   . History of kidney stones    "passed it" (02/19/2017)  . Hypercholesterolemia    history  . Left knee pain    occasionally  . Palpitations IRREGULAR HEARTBEAT--  CONTROLLED W/  BETA BLOCKER  . Pneumonia 2017   "walking pneumonia" (02/19/2017)  . Pre-diabetes    pcp is monitoring a1c currently 02/10/17  . Swelling of left knee joint     Past Surgical History:  Procedure Laterality Date  . BREAST BIOPSY Right 1986  . BREAST BIOPSY Left   . BREAST RECONSTRUCTION Right 1987   W/ IMPLANTS  . CARDIAC CATHETERIZATION  12-22-2000  DR BRACKBILL   NORMAL LVF/ NORMAL CORONARY ARTERIES  . CHOLECYSTECTOMY OPEN  1988  . CHONDROPLASTY  09/08/2012   Procedure: CHONDROPLASTY;  Surgeon: Tobi Bastos, MD;  Location: [facility];  Service: Orthopedics;  Laterality: Left;  . COLONOSCOPY     last colon 05-03-2009 (02/19/2017)  . COLONOSCOPY W/ BIOPSIES AND POLYPECTOMY    . KNEE ARTHROSCOPY WITH MEDIAL MENISECTOMY  09/08/2012   Procedure: KNEE ARTHROSCOPY WITH MEDIAL MENISECTOMY;  Surgeon: Tobi Bastos, MD;  Location: [facility];  Service: Orthopedics;  Laterality: Left;  lateral tibial plateau,   . MASTECTOMY Right 1986   W/ MULTIPLE NODE DISSECTION  . REDUCTION MAMMAPLASTY Left 1987  . TOTAL KNEE ARTHROPLASTY Left 07/21/2013   Procedure: TOTAL KNEE ARTHROPLASTY;  Surgeon: Newt Minion, MD;  Location: [facility];  Service: Orthopedics;  Laterality: Left;  Left Total Knee Arthroplasty  . TOTAL KNEE ARTHROPLASTY Right  02/19/2017   Procedure: RIGHT TOTAL KNEE ARTHROPLASTY;  Surgeon: Newt Minion, MD;  Location: [facility];  Service: Orthopedics;  Laterality: Right;  Marland Kitchen VAGINAL HYSTERECTOMY  1966    FAMHx:  Family History  Problem Relation Age of Onset  . Heart attack Father   . Heart failure Mother        CHF  . Dementia Maternal Grandfather   . Dementia Sister   . Colon cancer Brother        died/age 32  . Lung cancer Sister   . Brain cancer Sister   . Cancer Brother        mouth  . Leukemia Brother   . Cancer Sister        type unknown  . Cancer Sister        type unknown  .  Heart disease Daughter     SOCHx:   reports that she has never smoked. She has never used smokeless tobacco. She reports current alcohol use of about 1.0 standard drinks of alcohol per week. She reports that she does not use drugs.  ALLERGIES:  Allergies  Allergen Reactions  . Crestor [Rosuvastatin Calcium] Other (See Comments)    Shoulder pain  . Lipitor [Atorvastatin Calcium] Other (See Comments)    Myalgias   . Pravachol [Pravastatin] Other (See Comments)    myalgias  . Penicillins Itching    Has patient had a PCN reaction causing immediate rash, facial/tongue/throat swelling, SOB or lightheadedness with hypotension: No Has patient had a PCN reaction causing severe rash involving mucus membranes or skin necrosis: No Has patient had a PCN reaction that required hospitalization No Has patient had a PCN reaction occurring within the last 10 years: No If all of the above answers are "NO", then may proceed with Cephalosporin use.     ROS: Pertinent items noted in HPI and remainder of comprehensive ROS otherwise negative.  HOME MEDS: Current Outpatient Medications  Medication Sig Dispense Refill  . aspirin 81 MG tablet Take 81 mg by mouth 2 (two) times daily.    Marland Kitchen BIOTIN 5000 PO Take 5,000 mcg by mouth daily.     Marland Kitchen co-enzyme Q-10 30 MG capsule Take 30 mg by mouth 2 (two) times daily.     Marland Kitchen ezetimibe (ZETIA) 10  MG tablet Take 1 tablet by mouth once daily 90 tablet 1  . fish oil-omega-3 fatty acids 1000 MG capsule Take 1 g by mouth daily.     . furosemide (LASIX) 40 MG tablet Take 1 tablet (40 mg total) by mouth daily. 15 tablet 0  . ibuprofen (ADVIL,MOTRIN) 200 MG tablet Take 400 mg by mouth 2 (two) times daily as needed for moderate pain.    . methocarbamol (ROBAXIN) 750 MG tablet Take 1 tablet by mouth daily.    . metoprolol tartrate (LOPRESSOR) 50 MG tablet Take 1 tablet by mouth twice daily 180 tablet 1  . Multiple Vitamins-Minerals (CENTRUM SILVER PO) Take 1 tablet by mouth every morning.     Marland Kitchen omeprazole (PRILOSEC) 40 MG capsule TAKE 1 CAPSULE BY MOUTH TWICE DAILY BEFORE MEAL(S) 180 capsule 0  . triamcinolone (NASACORT) 55 MCG/ACT AERO nasal inhaler Place 2 sprays into the nose daily. 1 Inhaler 12   No current facility-administered medications for this visit.    LABS/IMAGING: Results for orders placed or performed during the hospital encounter of 02/03/20 (from the past 48 hour(s))  CBC with Differential     Status: Abnormal   Collection Time: 02/03/20  1:28 AM  Result Value Ref Range   WBC 8.4 4.0 - 10.5 K/uL   RBC 3.69 (L) 3.87 - 5.11 MIL/uL   Hemoglobin 11.5 (L) 12.0 - 15.0 g/dL   HCT 35.7 (L) 36.0 - 46.0 %   MCV 96.7 80.0 - 100.0 fL   MCH 31.2 26.0 - 34.0 pg   MCHC 32.2 30.0 - 36.0 g/dL   RDW 12.6 11.5 - 15.5 %   Platelets 300 150 - 400 K/uL   nRBC 0.0 0.0 - 0.2 %   Neutrophils Relative % 45 %   Neutro Abs 3.8 1.7 - 7.7 K/uL   Lymphocytes Relative 40 %   Lymphs Abs 3.4 0.7 - 4.0 K/uL   Monocytes Relative 10 %   Monocytes Absolute 0.9 0.1 - 1.0 K/uL   Eosinophils Relative 4 %   Eosinophils  Absolute 0.3 0.0 - 0.5 K/uL   Basophils Relative 1 %   Basophils Absolute 0.0 0.0 - 0.1 K/uL   Immature Granulocytes 0 %   Abs Immature Granulocytes 0.03 0.00 - 0.07 K/uL    Comment: Performed at [facility] [address], [place], [place] [address]  Basic metabolic panel      Status: Abnormal   Collection Time: 02/03/20  1:28 AM  Result Value Ref Range   Sodium 138 135 - 145 mmol/L   Potassium 4.1 3.5 - 5.1 mmol/L   Chloride 102 98 - 111 mmol/L   CO2 24 22 - 32 mmol/L   Glucose, Bld 120 (H) 70 - 99 mg/dL    Comment: Glucose reference range applies only to samples taken after fasting for at least 8 hours.   BUN 18 8 - 23 mg/dL   Creatinine, Ser 1.31 (H) 0.44 - 1.00 mg/dL   Calcium 9.2 8.9 - 10.3 mg/dL   GFR calc non Af Amer 39 (L) >60 mL/min   GFR calc Af Amer 45 (L) >60 mL/min   Anion gap 12 5 - 15    Comment: Performed at [facility] [address], [place], [place] [address]  Troponin I (High Sensitivity)     Status: None   Collection Time: 02/03/20  1:28 AM  Result Value Ref Range   Troponin I (High Sensitivity) 12 <18 ng/L    Comment: (NOTE) Elevated high sensitivity troponin I (hsTnI) values and significant  changes across serial measurements may suggest ACS but many other  chronic and acute conditions are known to elevate hsTnI results.  Refer to the "Links" section for chest pain algorithms and additional  guidance. Performed at [facility], [place] [address], [place], [place] [address]   Brain natriuretic peptide     Status: Abnormal   Collection Time: 02/03/20  1:28 AM  Result Value Ref Range   B Natriuretic Peptide 156.1 (H) 0.0 - 100.0 pg/mL    Comment: Performed at [facility] [address] [place], [place] [address]   DG Chest 2 View  Result Date: 02/03/2020 CLINICAL DATA:  Shortness of breath EXAM: CHEST - 2 VIEW COMPARISON:  12/13/2017 FINDINGS: Increased interstitial markings, favoring mild interstitial edema. However, there is no definite pleural effusion. No pneumothorax. The heart is normal in size. Postsurgical changes involving the right breast with right axillary lymph node dissection. IMPRESSION: Suspected mild interstitial edema.  No definite pleural effusions. Electronically Signed   By: Julian Hy  M.D.   On: 02/03/2020 00:55    WEIGHTS: Wt Readings from Last 3 Encounters:  02/04/20 164 lb (74.4 kg)  12/16/19 166 lb (75.3 kg)  11/09/19 166 lb (75.3 kg)    VITALS: BP 120/70   Pulse 91   Temp (!) 97.2 F (36.2 C)   Ht 5\' 2"  (1.575 m)   Wt 164 lb (74.4 kg)   SpO2 97%   BMI 30.00 kg/m   EXAM: General appearance: alert and no distress Neck: no carotid bruit and no JVD Lungs: clear to auscultation bilaterally Heart: regular rate and rhythm and systolic murmur: early systolic 2/6, crescendo at 2nd right intercostal space Abdomen: soft, non-tender; bowel sounds normal; no masses,  no organomegaly Extremities: extremities normal, atraumatic, no cyanosis or edema Pulses: 2+ and symmetric Skin: Skin color, texture, turgor normal. No rashes or lesions Neurologic: Grossly normal Psych: Pleasant  EKG: Deferred-recent ER EKG reviewed  ASSESSMENT: 1. Dyspnea on exertion  2. Palpitations-history of PSVT and occasional PVCs 3. Dyslipidemia-intolerant to statins 4. Essential hypertension 5. GERD  PLAN: 1.   Leslie Bryant had somewhat acute to subacute dyspnea on exertion and was found to have some mild pulmonary edema and elevated BNP with symptoms of orthopnea concerning for congestive heart failure.  The etiology is not clear however I would like to get an echocardiogram to further evaluate this.  She was placed on Lasix which she is taking and has lost a little weight and improvement in her breathing.  I advised her to continue on the 40 mg daily Lasix for now.  We will plan follow-up after her echo.  Pixie Casino, MD, [facility], [place] Director of the Advanced Lipid Disorders &  Cardiovascular Risk Reduction Clinic Diplomate of the American Board of Clinical Lipidology Attending Cardiologist  Direct Dial: [phone]  Fax: [phone]  Website:  [email or web address] 02/04/2020, 4:45 PM

## 2020-02-04 NOTE — Patient Instructions (Signed)
Medication Instructions:  Your physician recommends that you continue on your current medications as directed. Please refer to the Current Medication list given to you today.  Testing/Procedures: Your physician has requested that you have an echocardiogram. Echocardiography is a painless test that uses sound waves to create images of your heart. It provides your doctor with information about the size and shape of your heart and how well your heart's chambers and valves are working. This procedure takes approximately one hour. There are no restrictions for this procedure. -- [address] -- schedule next week    Follow-Up: At [facility], you and your health needs are our priority.  As part of our continuing mission to provide you with exceptional heart care, we have created designated Provider Care Teams.  These Care Teams include your primary Cardiologist (physician) and Advanced Practice Providers (APPs -  Physician Assistants and Nurse Practitioners) who all work together to provide you with the care you need, when you need it.  We recommend signing up for the patient portal called "MyChart".  Sign up information is provided on this After Visit Summary.  MyChart is used to connect with patients for Virtual Visits (Telemedicine).  Patients are able to view lab/test results, encounter notes, upcoming appointments, etc.  Non-urgent messages can be sent to your provider as well.   To learn more about what you can do with MyChart, go to [email or web address].    Your next appointment:   2-4 week(s)  The format for your next appointment:   In Person  Provider:   You may see Pixie Casino, MD or one of the following Advanced Practice Providers on your designated Care Team:    Almyra Deforest, PA-C  Fabian Sharp, PA-C or   Roby Lofts, [place]    Other Instructions

## 2020-02-07 ENCOUNTER — Ambulatory Visit (HOSPITAL_COMMUNITY): Payer: Medicare Other | Attending: Cardiovascular Disease

## 2020-02-07 ENCOUNTER — Telehealth: Payer: Self-pay | Admitting: Internal Medicine

## 2020-02-07 ENCOUNTER — Other Ambulatory Visit: Payer: Self-pay

## 2020-02-07 DIAGNOSIS — R0602 Shortness of breath: Secondary | ICD-10-CM | POA: Diagnosis not present

## 2020-02-07 DIAGNOSIS — R7989 Other specified abnormal findings of blood chemistry: Secondary | ICD-10-CM

## 2020-02-07 NOTE — Telephone Encounter (Signed)
Left message for patient to call and schedule Echo and 2-4 week follow up with Dr. Debara Pickett

## 2020-02-09 ENCOUNTER — Telehealth: Payer: Self-pay | Admitting: Internal Medicine

## 2020-02-09 DIAGNOSIS — I491 Atrial premature depolarization: Secondary | ICD-10-CM

## 2020-02-09 DIAGNOSIS — R0602 Shortness of breath: Secondary | ICD-10-CM

## 2020-02-09 NOTE — Telephone Encounter (Signed)
New message  Patient is calling in about results. Please give patient a call back.

## 2020-02-09 NOTE — Telephone Encounter (Signed)
I reviewed the echo personally - I actually think the LVEF is more around 45-50% - there does appear to be afib or frequent PAC's- was just seen in the ER on 4/1 (in sinus rhythm) - Please arrange for a 2 week zio monitor to evaluate for afib - this may be why she is short of breath and had evidence of a little CHF.  Message above is from Dr Debara Pickett.  I spoke with patient and reviewed results and recommendations with her. She is aware monitor will be mailed to her.  Darrick Penna will send monitor information to patient through my chart.  Due to patient's insurance will need to order cardiac event monitor

## 2020-02-10 ENCOUNTER — Telehealth: Payer: Self-pay | Admitting: Medical

## 2020-02-10 ENCOUNTER — Ambulatory Visit (INDEPENDENT_AMBULATORY_CARE_PROVIDER_SITE_OTHER): Payer: Medicare Other

## 2020-02-10 ENCOUNTER — Ambulatory Visit: Payer: Medicare Other | Admitting: Internal Medicine

## 2020-02-10 DIAGNOSIS — R0602 Shortness of breath: Secondary | ICD-10-CM

## 2020-02-10 DIAGNOSIS — I491 Atrial premature depolarization: Secondary | ICD-10-CM

## 2020-02-10 DIAGNOSIS — I480 Paroxysmal atrial fibrillation: Secondary | ICD-10-CM

## 2020-02-10 MED ORDER — APIXABAN 2.5 MG PO TABS: 5.0000 mg | ORAL_TABLET | Freq: Two times a day (BID) | ORAL | Status: DC

## 2020-02-10 NOTE — Telephone Encounter (Signed)
Received a page regarding critical EKG on ziopatch. Called and they reported Afib at around 1PM for 1 minute with rate of 100 bpm, Otherwise monitor showed NSR with HR in the 80s. Report was faxed to me and both Dr. Radford Pax and I reviewed it who confirmed it was afib. I called the patient and she said that she has 'episodes' throughout the day. She did have one in the afternoon. When she has an episode she feels sob and occasional palpitations. They only last a couple seconds. No chest pain, lightheadedness, dizziness, or syncope.   Reviewed patient's chart with Dr. Radford Pax. Does not appear to have contraindications to anticoagulation. CHADSVASC = 4 (agex2, HTN, female). On 4/1 labs showed Creatinine 1.3. and HGb 11.5. Eliquis 5 mg BID was sent in to her pharmacy. Recommended patient stop Aspirin and Ibuprofen for increased bleeding risk. Will request apt with Afib clinic and with Dr. Debara Pickett within the next 2 weeks. Patient voiced understanding.   Alphus Zeck Kathlen Mody, PA-C

## 2020-02-11 ENCOUNTER — Telehealth: Payer: Self-pay | Admitting: Medical

## 2020-02-11 ENCOUNTER — Telehealth: Payer: Self-pay

## 2020-02-11 DIAGNOSIS — I480 Paroxysmal atrial fibrillation: Secondary | ICD-10-CM

## 2020-02-11 MED ORDER — APIXABAN 5 MG PO TABS: 5.0000 mg | ORAL_TABLET | Freq: Two times a day (BID) | ORAL | 11 refills | Status: DC

## 2020-02-11 NOTE — Telephone Encounter (Signed)
Pt c/o medication issue:  1. Name of Medication: apixaban (ELIQUIS) tablet 5 mg  2. How are you currently taking this medication (dosage and times per day)? Not currently taking medication  3. Are you having a reaction (difficulty breathing--STAT)? No   4. What is your medication issue? Leslie Bryant is calling requesting a nurse call her and advice her in regards to when she is supposed to begin taking Eliquis. Please advise.

## 2020-02-11 NOTE — Telephone Encounter (Signed)
Thanks to both of you. I agree.  -Mali

## 2020-02-11 NOTE — Telephone Encounter (Signed)
I think I have follow-up with her ... I can manage her afib.  Dr Lemmie Evens

## 2020-02-11 NOTE — Telephone Encounter (Signed)
-----   Message from [place], PA-C sent at 02/10/2020  7:17 PM EDT ----- Regarding: Afib clinic and follow-up Hi, I am not sure where to send this to get a visit with the Afib clinic but this patient just showed new onset paroxysmal afib on her ziopatch. She is followed with  Dr. Debara Pickett (who suspected this). I called the patient and talked to her last night and sent in Eliquis.   Can she please get a new patient appointment in the afib clinic and then get a follow-up with Dr. Debara Pickett in 2-3 weeks if she doesn't have one?  Thanks so much, Cadence

## 2020-02-11 NOTE — Telephone Encounter (Signed)
Spoke with patient, advised to start Eliquis as soon as she gets Rx today. Advised to take BID - every 12 hours. Onsite triage nurse will leave free 30 day voucher for pick up at [facility] office. She has f/u with Dr. Debara Pickett on 2-3  Will route to MD regarding recommendation for AFib clinic appointment per Cadence PA - see phone note from 4/8

## 2020-02-11 NOTE — Telephone Encounter (Signed)
New afib referral sent per VO

## 2020-02-11 NOTE — Telephone Encounter (Signed)
Received two [employer] monitor Critical event faxes from 02/10/20 at 12:52 pm showing Atrial flutter with Variable Conduction w/PVCs (1 in 1 min)  and again at 1:03 pm showing Atrial fibrillation sustained.  This has already been addressed by Dr.Hilty today. (see notes in epic)  Took faxes to DOD as an fyi and notified this was already being taken care of by Dr.Hilty.  Will route to MD to make aware.

## 2020-02-14 ENCOUNTER — Telehealth: Payer: Self-pay

## 2020-02-14 ENCOUNTER — Other Ambulatory Visit: Payer: Self-pay | Admitting: Internal Medicine

## 2020-02-14 NOTE — Telephone Encounter (Addendum)
Received a Serious Event fax from [employer]: Day 2 02/11/20 at 11:36 am- Atrial Fibrillation RVR Sustained  Called and spoke with pt. Notified of monitor. She states she had been filling episodes where she feels like her heart is "messing up". She does not remember what she was doing at the time of the monitor event, but she states that she feels good now and had a great day yesterday. Notified that if she begins becoming symptomatic to let us know and that if she begins having chest pain or shortness of breath to seek immediate evaluation at her nearest ED. Pt verbalized understanding. Notified I would show the monitor to our DOD and send the message to Dr.Hilty as well and if they had any recommendations then we would notify her. Pt verbalized understanding with no other questions at this time.  Spoke with Dr.Acharya, she stated if the patient was feeling fine then okay to just continue with monitoring. Will defer to Dr.Hilty

## 2020-02-15 ENCOUNTER — Other Ambulatory Visit: Payer: Self-pay

## 2020-02-15 ENCOUNTER — Encounter (HOSPITAL_COMMUNITY): Payer: Self-pay | Admitting: Physician Assistant

## 2020-02-15 ENCOUNTER — Ambulatory Visit (HOSPITAL_COMMUNITY)
Admission: RE | Admit: 2020-02-15 | Discharge: 2020-02-15 | Disposition: A | Discharge status: Home / Self Care | Payer: Medicare Other | Source: Ambulatory Visit | Attending: Physician Assistant | Admitting: Physician Assistant

## 2020-02-15 ENCOUNTER — Telehealth: Payer: Self-pay | Admitting: Internal Medicine

## 2020-02-15 VITALS — BP 116/64 | HR 103 | Ht 62.0 in | Wt 165.0 lb

## 2020-02-15 DIAGNOSIS — Z6830 Body mass index (BMI) 30.0-30.9, adult: Secondary | ICD-10-CM | POA: Diagnosis not present

## 2020-02-15 DIAGNOSIS — Z808 Family history of malignant neoplasm of other organs or systems: Secondary | ICD-10-CM | POA: Insufficient documentation

## 2020-02-15 DIAGNOSIS — I471 Supraventricular tachycardia: Secondary | ICD-10-CM | POA: Insufficient documentation

## 2020-02-15 DIAGNOSIS — Z96653 Presence of artificial knee joint, bilateral: Secondary | ICD-10-CM | POA: Insufficient documentation

## 2020-02-15 DIAGNOSIS — Z7901 Long term (current) use of anticoagulants: Secondary | ICD-10-CM | POA: Diagnosis not present

## 2020-02-15 DIAGNOSIS — D6869 Other thrombophilia: Secondary | ICD-10-CM | POA: Insufficient documentation

## 2020-02-15 DIAGNOSIS — Z853 Personal history of malignant neoplasm of breast: Secondary | ICD-10-CM | POA: Insufficient documentation

## 2020-02-15 DIAGNOSIS — Z800 Family history of malignant neoplasm of digestive organs: Secondary | ICD-10-CM | POA: Insufficient documentation

## 2020-02-15 DIAGNOSIS — K219 Gastro-esophageal reflux disease without esophagitis: Secondary | ICD-10-CM | POA: Insufficient documentation

## 2020-02-15 DIAGNOSIS — Z888 Allergy status to other drugs, medicaments and biological substances status: Secondary | ICD-10-CM | POA: Insufficient documentation

## 2020-02-15 DIAGNOSIS — I480 Paroxysmal atrial fibrillation: Secondary | ICD-10-CM | POA: Insufficient documentation

## 2020-02-15 DIAGNOSIS — Z8249 Family history of ischemic heart disease and other diseases of the circulatory system: Secondary | ICD-10-CM | POA: Insufficient documentation

## 2020-02-15 DIAGNOSIS — I10 Essential (primary) hypertension: Secondary | ICD-10-CM | POA: Diagnosis not present

## 2020-02-15 DIAGNOSIS — E669 Obesity, unspecified: Secondary | ICD-10-CM | POA: Diagnosis not present

## 2020-02-15 DIAGNOSIS — Z79899 Other long term (current) drug therapy: Secondary | ICD-10-CM | POA: Diagnosis not present

## 2020-02-15 DIAGNOSIS — Z880 Allergy status to penicillin: Secondary | ICD-10-CM | POA: Insufficient documentation

## 2020-02-15 DIAGNOSIS — I4891 Unspecified atrial fibrillation: Secondary | ICD-10-CM | POA: Insufficient documentation

## 2020-02-15 DIAGNOSIS — Z801 Family history of malignant neoplasm of trachea, bronchus and lung: Secondary | ICD-10-CM | POA: Insufficient documentation

## 2020-02-15 MED ORDER — FUROSEMIDE 40 MG PO TABS: 40.0000 mg | ORAL_TABLET | Freq: Every day | ORAL | 3 refills | Status: DC

## 2020-02-15 MED ORDER — METOPROLOL TARTRATE 50 MG PO TABS: 75.0000 mg | ORAL_TABLET | Freq: Two times a day (BID) | ORAL | 1 refills | Status: DC

## 2020-02-15 MED ORDER — APIXABAN 5 MG PO TABS: 5.0000 mg | ORAL_TABLET | Freq: Two times a day (BID) | ORAL | 11 refills | Status: DC

## 2020-02-15 NOTE — Telephone Encounter (Signed)
Patient has AFib clinic OV on 02/15/20

## 2020-02-15 NOTE — Progress Notes (Signed)
Primary Care Physician: Biagio Borg, MD Primary Cardiologist: Dr Debara Pickett Primary Electrophysiologist: none Referring Physician: Dr Rayne Du Leslie Bryant is a 79 y.o. female with a history of HLD, HTN, and new onset paroxysmal atrial fibrillation who presents for consultation in the [facility].  Patient was seen at the ED on 02/03/20 for increased SOB. She had mildly elevated BNP and some interstitial edema on CXR. She was started on Lasix with some improvement. On follow up with Dr Debara Pickett, she had an echocardiogram which showed EF 45-50% with frequent ectopy. A Zio patch was ordered which showed afib on 02/10/20. She was started on Eliquis for a CHADS2VASC score of 4. Patient reports she has intermittent symptoms of SOB, palpitations, and fatigue. The previous three days she felt great but is worse again today. She is in afib today. She denies any bleeding issues on anticoagulation. She denies significant snoring or alcohol use. She does admit to drinking 3-4 cups of coffee daily.   Today, she denies symptoms of chest pain, orthopnea, PND, lower extremity edema, dizziness, presyncope, syncope, snoring, daytime somnolence, bleeding, or neurologic sequela. The patient is tolerating medications without difficulties and is otherwise without complaint today.    Atrial Fibrillation Risk Factors:  she does not have symptoms or diagnosis of sleep apnea. she does not have a history of rheumatic fever. she does not have a history of alcohol use. The patient does not have a history of early familial atrial fibrillation or other arrhythmias.  she has a BMI of Body mass index is 30.18 kg/m.Marland Kitchen Filed Weights   02/15/20 1330  Weight: 74.8 kg    Family History  Problem Relation Age of Onset  . Heart attack Father   . Heart failure Mother        CHF  . Dementia Maternal Grandfather   . Dementia Sister   . Colon cancer Brother        died/age 79  . Lung cancer Sister     . Brain cancer Sister   . Cancer Brother        mouth  . Leukemia Brother   . Cancer Sister        type unknown  . Cancer Sister        type unknown  . Heart disease Daughter      Atrial Fibrillation Management history:  Previous antiarrhythmic drugs: none Previous cardioversions: none Previous ablations: none CHADS2VASC score: 4 Anticoagulation history: Eliquis   Past Medical History:  Diagnosis Date  . Acute meniscal tear of knee LEFT KNEE  . Allergic rhinitis   . Arthritis    "knees" (02/19/2017)  . Atrial fibrillation ([place])   . Breast cancer, right breast ([place]) 1986  . 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 Diverticulitis of large intestine with perforation 11/02/2011   Microperforation probably related to nut and popcorn consumption.  Resolved by CT scan Recurrent episode clinical dx 07/2012   . Diverticulosis of colon    sigmoid  . GERD (gastroesophageal reflux disease)   . H/O hiatal hernia   . History of kidney stones    "passed it" (02/19/2017)  . Hypercholesterolemia    history  . Left knee pain    occasionally  . Palpitations IRREGULAR HEARTBEAT--  CONTROLLED W/  BETA BLOCKER  . Pneumonia 2017   "walking pneumonia" (02/19/2017)  . Pre-diabetes    pcp is monitoring a1c currently 02/10/17  . Swelling of left knee  joint    Past Surgical History:  Procedure Laterality Date  . BREAST BIOPSY Right 1986  . BREAST BIOPSY Left   . BREAST RECONSTRUCTION Right 1987   W/ IMPLANTS  . CARDIAC CATHETERIZATION  12-22-2000  DR BRACKBILL   NORMAL LVF/ NORMAL CORONARY ARTERIES  . CHOLECYSTECTOMY OPEN  1988  . CHONDROPLASTY  09/08/2012   Procedure: CHONDROPLASTY;  Surgeon: Tobi Bastos, MD;  Location: [facility];  Service: Orthopedics;  Laterality: Left;  . COLONOSCOPY     last colon 05-03-2009 (02/19/2017)  . COLONOSCOPY W/ BIOPSIES AND POLYPECTOMY    . KNEE ARTHROSCOPY WITH MEDIAL MENISECTOMY  09/08/2012   Procedure: KNEE ARTHROSCOPY WITH MEDIAL  MENISECTOMY;  Surgeon: Tobi Bastos, MD;  Location: [facility];  Service: Orthopedics;  Laterality: Left;  lateral tibial plateau,   . MASTECTOMY Right 1986   W/ MULTIPLE NODE DISSECTION  . REDUCTION MAMMAPLASTY Left 1987  . TOTAL KNEE ARTHROPLASTY Left 07/21/2013   Procedure: TOTAL KNEE ARTHROPLASTY;  Surgeon: Newt Minion, MD;  Location: [facility];  Service: Orthopedics;  Laterality: Left;  Left Total Knee Arthroplasty  . TOTAL KNEE ARTHROPLASTY Right 02/19/2017   Procedure: RIGHT TOTAL KNEE ARTHROPLASTY;  Surgeon: Newt Minion, MD;  Location: [facility];  Service: Orthopedics;  Laterality: Right;  Marland Kitchen VAGINAL HYSTERECTOMY  1966    Current Outpatient Medications  Medication Sig Dispense Refill  . apixaban (ELIQUIS) 5 MG TABS tablet Take 1 tablet (5 mg total) by mouth 2 (two) times daily. 60 tablet 11  . BIOTIN 5000 PO Take 5,000 mcg by mouth daily.     Marland Kitchen co-enzyme Q-10 30 MG capsule Take 30 mg by mouth 2 (two) times daily.     Marland Kitchen ezetimibe (ZETIA) 10 MG tablet Take 1 tablet by mouth once daily 90 tablet 3  . fish oil-omega-3 fatty acids 1000 MG capsule Take 1 g by mouth daily.     . furosemide (LASIX) 40 MG tablet Take 1 tablet (40 mg total) by mouth daily. 90 tablet 3  . metoprolol tartrate (LOPRESSOR) 50 MG tablet Take 1.5 tablets (75 mg total) by mouth 2 (two) times daily. 180 tablet 1  . Multiple Vitamins-Minerals (CENTRUM SILVER PO) Take 1 tablet by mouth every morning.     Marland Kitchen omeprazole (PRILOSEC) 40 MG capsule TAKE 1 CAPSULE BY MOUTH TWICE DAILY BEFORE MEAL(S) 180 capsule 0  . triamcinolone (NASACORT) 55 MCG/ACT AERO nasal inhaler Place 2 sprays into the nose daily. 1 Inhaler 12   No current facility-administered medications for this encounter.    Allergies  Allergen Reactions  . Crestor [Rosuvastatin Calcium] Other (See Comments)    Shoulder pain  . Lipitor [Atorvastatin Calcium] Other (See Comments)    Myalgias   . Pravachol [Pravastatin] Other (See Comments)     myalgias  . Penicillins Itching    Has patient had a PCN reaction causing immediate rash, facial/tongue/throat swelling, SOB or lightheadedness with hypotension: No Has patient had a PCN reaction causing severe rash involving mucus membranes or skin necrosis: No Has patient had a PCN reaction that required hospitalization No Has patient had a PCN reaction occurring within the last 10 years: No If all of the above answers are "NO", then may proceed with Cephalosporin use.     Social History   Socioeconomic History  . Marital status: Married    Spouse name: Not on file  . Number of children: 2  . Years of education: Not on file  . Highest  education level: Not on file  Occupational History  . Occupation: Retired  Tobacco Use  . Smoking status: Never Smoker  . Smokeless tobacco: Never Used  Substance and Sexual Activity  . Alcohol use: Yes    Alcohol/week: 1.0 standard drinks    Types: 1 Glasses of wine per week    Comment: social wine once a month  . Drug use: No  . Sexual activity: Yes  Other Topics Concern  . Not on file  Social History Narrative  . Not on file   Social Determinants of Health   Financial Resource Strain:   . Difficulty of Paying Living Expenses:   Food Insecurity:   . Worried About Charity fundraiser in the Last Year:   . Arboriculturist in the Last Year:   Transportation Needs:   . Film/video editor (Medical):   Marland Kitchen Lack of Transportation (Non-Medical):   Physical Activity:   . Days of Exercise per Week:   . Minutes of Exercise per Session:   Stress:   . Feeling of Stress :   Social Connections:   . Frequency of Communication with Friends and Family:   . Frequency of Social Gatherings with Friends and Family:   . Attends Religious Services:   . Active Member of Clubs or Organizations:   . Attends Archivist Meetings:   Marland Kitchen Marital Status:   Intimate Partner Violence:   . Fear of Current or Ex-Partner:   . Emotionally Abused:   Marland Kitchen  Physically Abused:   . Sexually Abused:      ROS- All systems are reviewed and negative except as per the HPI above.  Physical Exam: Vitals:   02/15/20 1330  BP: 116/64  Pulse: (!) 103  Weight: 74.8 kg  Height: 5\' 2"  (1.575 m)    GEN- The patient is well appearing obese elderly female, alert and oriented x 3 today.   Head- normocephalic, atraumatic Eyes-  Sclera clear, conjunctiva pink Ears- hearing intact Oropharynx- clear Neck- supple  Lungs- Clear to ausculation bilaterally, normal work of breathing Heart- irregular rate and rhythm, no murmurs, rubs or gallops  GI- soft, NT, ND, + BS Extremities- no clubbing, cyanosis, or edema MS- no significant deformity or atrophy Skin- no rash or lesion Psych- euthymic mood, full affect Neuro- strength and sensation are intact  Wt Readings from Last 3 Encounters:  02/15/20 74.8 kg  02/04/20 74.4 kg  12/16/19 75.3 kg    EKG today demonstrates afib HR 103, QRS 72, QTc 455  Echo 02/07/20 demonstrated  1. Left ventricular ejection fraction, by estimation, is 60 to 65%. The  left ventricle has normal function. The left ventricle has no regional  wall motion abnormalities. Left ventricular diastolic function could not  be evaluated.  2. Right ventricular systolic function is normal. The right ventricular  size is normal. There is normal pulmonary artery systolic pressure.  3. The mitral valve is normal in structure. Trivial mitral valve  regurgitation. No evidence of mitral stenosis.  4. The aortic valve is grossly normal. Aortic valve regurgitation is  trivial. No aortic stenosis is present.   (EF on phone note 4/7 felt to be around 45-50%)  Epic records are reviewed at length today  CHA2DS2-VASc Score = 4 The patient's score is based upon: CHF History: No HTN History: Yes Age : 80 + Diabetes History: No Stroke History: No Vascular Disease History: No Gender: Female      ASSESSMENT AND PLAN: 1. Paroxysmal Atrial  Fibrillation/PSVT The patient's CHA2DS2-VASc score is 4, indicating a 4.8% annual risk of stroke.   General education about afib provided and questions answered. We also discussed her stroke risk and the risks and benefits of anticoagulation. Clinically, she sounds paroxysmal, will wait to see her burden on Zio patch.  Continue Eliquis 5 mg BID Increase Lopressor to 75 mg BID Encouraged her to decrease caffeine use.  2. Secondary Hypercoagulable State (ICD10:  D68.69) The patient is at significant risk for stroke/thromboembolism based upon her CHA2DS2-VASc Score of 4.  Continue Apixaban (Eliquis).   3. Obesity Body mass index is 30.18 kg/m. Lifestyle modification was discussed at length including regular exercise and weight reduction.  4. HTN Stable, med changes as above.     Follow up with Dr Debara Pickett as scheduled.    [facility] [address] [place], [place] [address] [phone] 02/15/2020 2:03 PM

## 2020-02-15 NOTE — Telephone Encounter (Signed)
Pt c/o medication issue:  1. Name of Medication: furosemide (LASIX) 40 MG tablet  2. How are you currently taking this medication (dosage and times per day)? Once a day  3. Are you having a reaction (difficulty breathing--STAT)? no  4. What is your medication issue? Patient would like to know if she can continue taking the medication she received from the hospital.

## 2020-02-15 NOTE — Telephone Encounter (Signed)
Patient advised per MD note that she should continue lasix 40mg  QD Refilled for 90 day supply per patient to [facility]

## 2020-02-15 NOTE — Patient Instructions (Signed)
Increase Metoprolol '75mg'$  twice daily

## 2020-02-17 ENCOUNTER — Telehealth: Payer: Self-pay | Admitting: Internal Medicine

## 2020-02-17 NOTE — Telephone Encounter (Signed)
Patient states that she needs more strips that hold her heart monitor on.

## 2020-02-17 NOTE — Telephone Encounter (Signed)
Left message to call back  

## 2020-02-18 NOTE — Telephone Encounter (Signed)
[phone] - patient can call [facility] to order more strips.   Patient aware of this.

## 2020-02-24 ENCOUNTER — Encounter: Payer: Self-pay | Admitting: Internal Medicine

## 2020-02-24 ENCOUNTER — Ambulatory Visit: Payer: Medicare Other | Admitting: Internal Medicine

## 2020-02-24 ENCOUNTER — Other Ambulatory Visit: Payer: Self-pay

## 2020-02-24 VITALS — BP 115/69 | HR 90 | Resp 96 | Ht 62.0 in | Wt 166.0 lb

## 2020-02-24 DIAGNOSIS — R0602 Shortness of breath: Secondary | ICD-10-CM | POA: Diagnosis not present

## 2020-02-24 DIAGNOSIS — I480 Paroxysmal atrial fibrillation: Secondary | ICD-10-CM | POA: Diagnosis not present

## 2020-02-24 DIAGNOSIS — I4891 Unspecified atrial fibrillation: Secondary | ICD-10-CM | POA: Diagnosis not present

## 2020-02-24 NOTE — Patient Instructions (Signed)
Medication Instructions:  Your physician recommends that you continue on your current medications as directed. Please refer to the Current Medication list given to you today.  *If you need a refill on your cardiac medications before your next appointment, please call your pharmacy*   Follow-Up: At [facility], you and your health needs are our priority.  As part of our continuing mission to provide you with exceptional heart care, we have created designated Provider Care Teams.  These Care Teams include your primary Cardiologist (physician) and Advanced Practice Providers (APPs -  Physician Assistants and Nurse Practitioners) who all work together to provide you with the care you need, when you need it.  We recommend signing up for the patient portal called "MyChart".  Sign up information is provided on this After Visit Summary.  MyChart is used to connect with patients for Virtual Visits (Telemedicine).  Patients are able to view lab/test results, encounter notes, upcoming appointments, etc.  Non-urgent messages can be sent to your provider as well.   To learn more about what you can do with MyChart, go to [email or web address].    Your next appointment:   6 month(s)  The format for your next appointment:   In Person  Provider:   You may see Pixie Casino, MD or one of the following Advanced Practice Providers on your designated Care Team:    Almyra Deforest, PA-C  Fabian Sharp, PA-C or   Roby Lofts, PA-C    Other Instructions [employer] by Kathyrn Lass Watch  OK to return monitor

## 2020-02-24 NOTE — Progress Notes (Signed)
OFFICE NOTE  Chief Complaint:  Shortness of breath  Primary Care Physician: Biagio Borg, MD  HPI:  Leslie Bryant is a 79 y.o. female who is a former patient of Dr. Mare Ferrari. She is followed along with him for a number years and he also followed her husband who unfortunately died. She has no known coronary artery disease. She had heart catheterization 2002 which showed essentially normal coronaries. She's been followed for palpitations, history of PSVT which is been well controlled and dyslipidemia. Recently she was seen in the emergency department after working strenuously in the yard on a very hot day. She felt some fluttering in her chest and was noted to have some unifocal PVCs on a monitor. Recently her metoprolol was increased to 50 mg twice a day. She was advised to decrease caffeine intake. Since that time she's had no further recurrence and felt like it may be related to working outside in the hot weather. Blood pressure is well-controlled today. She has a history of intolerance to statins. Recently she had had some pain in her legs and thought it was related to study of. She was taken off that medicine and listed as an allergy however her symptoms did not change therefore she is interested in going back on the medicine. While she was on Zetia her LDL was 120 which is reasonable control.   12/24/2016  Leslie Bryant returns a for follow-up. Overall she seems to be doing well although suffering from some right knee pain. She told me that she intends to have right knee surgery hopefully in April with Dr. Sharol Given. She is asking for cardiovascular clearance. She denies any chest pain or worsening shortness of breath. Her blood pressure is well-controlled. She has been tolerant of ezetimibe 10 mg daily for cholesterol and recently her LDL was less than 80. She's previous he been intolerant of pravastatin, atorvastatin and rosuvastatin.  12/17/2017  Leslie Bryant is seen today in follow-up.  She  was recently in the emergency department beginning of February with some palpitations.  She also was describing some chest pain.  It was felt that this was more likely acid reflux.  Her PPI was doubled and her symptoms have improved.  She was noted to have PVCs, but this is known in her history.  She says her palpitations have improved and may be a lot related to stress.  We discussed possibility of using extra metoprolol or increasing her dose if necessary.  05/24/2019  Leslie Bryant is seen today in follow-up.  Overall she reports good control in her palpitations after we further increased her metoprolol to 50 mg twice daily.  Blood pressures generally in the 163-846 systolic range however was elevated this morning.  She reported she did not take her beta-blocker this morning.  She has normal sinus rhythm on EKG at 91.  She denies any chest pain or shortness of breath.  02/04/2020  Leslie Bryant returns today for a follow-up.  This is an acute visit as she was seen in the ER yesterday for shortness of breath.  She says has been building for about a couple weeks and was noted to be somewhat orthopneic requiring an extra pillow to elevate her head at night, short of breath and having a mild nonproductive cough.  In the ER she was found to have some mild interstitial edema on her chest x-ray and mildly elevated BNP just above 150.  She was placed on Lasix 40 mg daily and told to  follow-up with me today.  She reports perhaps a mild improvement in her breathing and weight is noted to be down a couple of pounds.  She denies any chest pain.  She has had both Covid vaccines and does not report any known Covid exposure.  This was not tested.  She also had a negative troponin.  She also had recent lipids showing total cholesterol 197, triglycerides 157, HDL 49 and LDL 116, somewhat improved only on ezetimibe monotherapy.  02/24/2020  Leslie Bryant is seen today in follow-up.  Her echo revealed the possibility of atrial  fibrillation.  We placed her on a monitor which did indeed demonstrate atrial fibrillation.  She was referred for some reason to the A. fib clinic and started on Eliquis.  She was seen by Adline Peals, PA-C who also increased her beta-blocker.  She returns today stating she is feeling pretty well.  She is in A. fib today.  She has been wearing it 2-week ZIO monitor which she is wearing today and will be turning it in.  Hopefully will have results to review next week to see what her burden of A. fib is.  I do believe is still paroxysmal which case there is probably little role for her cardioversion.  She is on Eliquis and tolerating it well.  Heart rate control is reasonable at 90 today.  She does report some improvement in her dyspnea and had perhaps some mild diastolic heart failure.  EF was normal on echo.  PMHx:  Past Medical History:  Diagnosis Date  . Acute meniscal tear of knee LEFT KNEE  . Allergic rhinitis   . Arthritis    "knees" (02/19/2017)  . Atrial fibrillation ([place])   . Breast cancer, right breast ([place]) 1986  . 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 Diverticulitis of large intestine with perforation 11/02/2011   Microperforation probably related to nut and popcorn consumption.  Resolved by CT scan Recurrent episode clinical dx 07/2012   . Diverticulosis of colon    sigmoid  . GERD (gastroesophageal reflux disease)   . H/O hiatal hernia   . History of kidney stones    "passed it" (02/19/2017)  . Hypercholesterolemia    history  . Left knee pain    occasionally  . Palpitations IRREGULAR HEARTBEAT--  CONTROLLED W/  BETA BLOCKER  . Pneumonia 2017   "walking pneumonia" (02/19/2017)  . Pre-diabetes    pcp is monitoring a1c currently 02/10/17  . Swelling of left knee joint     Past Surgical History:  Procedure Laterality Date  . BREAST BIOPSY Right 1986  . BREAST BIOPSY Left   . BREAST RECONSTRUCTION Right 1987   W/ IMPLANTS  . CARDIAC CATHETERIZATION  12-22-2000  DR BRACKBILL    NORMAL LVF/ NORMAL CORONARY ARTERIES  . CHOLECYSTECTOMY OPEN  1988  . CHONDROPLASTY  09/08/2012   Procedure: CHONDROPLASTY;  Surgeon: Tobi Bastos, MD;  Location: [facility];  Service: Orthopedics;  Laterality: Left;  . COLONOSCOPY     last colon 05-03-2009 (02/19/2017)  . COLONOSCOPY W/ BIOPSIES AND POLYPECTOMY    . KNEE ARTHROSCOPY WITH MEDIAL MENISECTOMY  09/08/2012   Procedure: KNEE ARTHROSCOPY WITH MEDIAL MENISECTOMY;  Surgeon: Tobi Bastos, MD;  Location: [facility];  Service: Orthopedics;  Laterality: Left;  lateral tibial plateau,   . MASTECTOMY Right 1986   W/ MULTIPLE NODE DISSECTION  . REDUCTION MAMMAPLASTY Left 1987  . TOTAL KNEE ARTHROPLASTY Left 07/21/2013  Procedure: TOTAL KNEE ARTHROPLASTY;  Surgeon: Newt Minion, MD;  Location: [facility];  Service: Orthopedics;  Laterality: Left;  Left Total Knee Arthroplasty  . TOTAL KNEE ARTHROPLASTY Right 02/19/2017   Procedure: RIGHT TOTAL KNEE ARTHROPLASTY;  Surgeon: Newt Minion, MD;  Location: [facility];  Service: Orthopedics;  Laterality: Right;  Marland Kitchen VAGINAL HYSTERECTOMY  1966    FAMHx:  Family History  Problem Relation Age of Onset  . Heart attack Father   . Heart failure Mother        CHF  . Dementia Maternal Grandfather   . Dementia Sister   . Colon cancer Brother        died/age 65  . Lung cancer Sister   . Brain cancer Sister   . Cancer Brother        mouth  . Leukemia Brother   . Cancer Sister        type unknown  . Cancer Sister        type unknown  . Heart disease Daughter     SOCHx:   reports that she has never smoked. She has never used smokeless tobacco. She reports current alcohol use of about 1.0 standard drinks of alcohol per week. She reports that she does not use drugs.  ALLERGIES:  Allergies  Allergen Reactions  . Crestor [Rosuvastatin Calcium] Other (See Comments)    Shoulder pain  . Lipitor [Atorvastatin Calcium] Other (See Comments)    Myalgias   . Pravachol  [Pravastatin] Other (See Comments)    myalgias  . Penicillins Itching    Has patient had a PCN reaction causing immediate rash, facial/tongue/throat swelling, SOB or lightheadedness with hypotension: No Has patient had a PCN reaction causing severe rash involving mucus membranes or skin necrosis: No Has patient had a PCN reaction that required hospitalization No Has patient had a PCN reaction occurring within the last 10 years: No If all of the above answers are "NO", then may proceed with Cephalosporin use.     ROS: Pertinent items noted in HPI and remainder of comprehensive ROS otherwise negative.  HOME MEDS: Current Outpatient Medications  Medication Sig Dispense Refill  . apixaban (ELIQUIS) 5 MG TABS tablet Take 1 tablet (5 mg total) by mouth 2 (two) times daily. 60 tablet 11  . BIOTIN 5000 PO Take 5,000 mcg by mouth daily.     Marland Kitchen co-enzyme Q-10 30 MG capsule Take 30 mg by mouth 2 (two) times daily.     Marland Kitchen ezetimibe (ZETIA) 10 MG tablet Take 1 tablet by mouth once daily 90 tablet 3  . fish oil-omega-3 fatty acids 1000 MG capsule Take 1 g by mouth daily.     . furosemide (LASIX) 40 MG tablet Take 1 tablet (40 mg total) by mouth daily. 90 tablet 3  . metoprolol tartrate (LOPRESSOR) 50 MG tablet Take 1.5 tablets (75 mg total) by mouth 2 (two) times daily. 180 tablet 1  . Multiple Vitamins-Minerals (CENTRUM SILVER PO) Take 1 tablet by mouth every morning.     Marland Kitchen omeprazole (PRILOSEC) 40 MG capsule TAKE 1 CAPSULE BY MOUTH TWICE DAILY BEFORE MEAL(S) 180 capsule 0  . triamcinolone (NASACORT) 55 MCG/ACT AERO nasal inhaler Place 2 sprays into the nose daily. 1 Inhaler 12   No current facility-administered medications for this visit.    LABS/IMAGING: No results found for this or any previous visit (from the past 48 hour(s)). No results found.  WEIGHTS: Wt Readings from Last 3 Encounters:  02/24/20 166 lb (75.3  kg)  02/15/20 165 lb (74.8 kg)  02/04/20 164 lb (74.4 kg)    VITALS: BP  115/69   Pulse 90   Resp (!) 96   Ht 5\' 2"  (1.575 m)   Wt 166 lb (75.3 kg)   BMI 30.36 kg/m   EXAM: General appearance: alert and no distress Neck: no carotid bruit and no JVD Lungs: clear to auscultation bilaterally Heart: regular rate and rhythm and systolic murmur: early systolic 2/6, crescendo at 2nd right intercostal space Abdomen: soft, non-tender; bowel sounds normal; no masses,  no organomegaly Extremities: extremities normal, atraumatic, no cyanosis or edema Pulses: 2+ and symmetric Skin: Skin color, texture, turgor normal. No rashes or lesions Neurologic: Grossly normal Psych: Pleasant  EKG: A. fib at 90, low voltage QRS-personally reviewed  ASSESSMENT: 1. Newly recognized atrial fibrillation-CHA2DS2-VASc score of 4 on Eliquis 2. Dyspnea on exertion - probably mild diastolic CHF, LVEF 62-56% (02/2020) 3. Palpitations-history of PSVT and occasional PVCs 4. Dyslipidemia-intolerant to statins 5. Essential hypertension 6. GERD  PLAN: 1.   Leslie Bryant was found to have newly recognized atrial fibrillation on her echo and subsequently monitoring confirmed that.  She was started on Eliquis and had an increase in her beta-blocker.  In general her rate control is pretty good at this point.  Echo showed normal systolic function by suspect she had some mild diastolic CHF.  She is on Lasix which we will continue.  Overall she reports improvement in her breathing.  I will review her 2-week ZIO monitor to see what her burden of atrial fibrillation is.  I believe it is still paroxysmal, so there is likely no role for cardioversion, however if it is persistent we may wish to consider that.  Plan follow-up otherwise with me in 6 months or sooner as necessary.  Pixie Casino, MD, [facility], [place] Director of the [facility] Lipid Disorders &  Cardiovascular Risk Reduction Clinic Diplomate of the American Board of Clinical Lipidology Attending Cardiologist   Direct Dial: [phone]  Fax: [phone]  Website:  [email or web address] Ajla Mcgeachy 02/24/2020, 11:13 AM

## 2020-03-02 ENCOUNTER — Ambulatory Visit: Payer: Medicare Other | Admitting: Internal Medicine

## 2020-03-02 ENCOUNTER — Encounter: Payer: Self-pay | Admitting: Internal Medicine

## 2020-03-02 ENCOUNTER — Other Ambulatory Visit: Payer: Self-pay

## 2020-03-02 VITALS — BP 128/82 | HR 84 | Temp 98.2°F | Ht 62.0 in | Wt 163.0 lb

## 2020-03-02 DIAGNOSIS — E119 Type 2 diabetes mellitus without complications: Secondary | ICD-10-CM | POA: Diagnosis not present

## 2020-03-02 DIAGNOSIS — R079 Chest pain, unspecified: Secondary | ICD-10-CM | POA: Diagnosis not present

## 2020-03-02 DIAGNOSIS — I10 Essential (primary) hypertension: Secondary | ICD-10-CM | POA: Diagnosis not present

## 2020-03-02 MED ORDER — FAMOTIDINE 40 MG PO TABS: 40.0000 mg | ORAL_TABLET | Freq: Every day | ORAL | 3 refills | Status: DC

## 2020-03-02 NOTE — Progress Notes (Addendum)
Subjective:    Patient ID: Leslie Bryant, female    DOB: 02-11-1941, 79 y.o.   MRN: [id number]  HPI    Here with c/o CP dull and sharp, intermittent, mild, sometimes seems like possible reflux with good compliance with ppi, but with recent wt gain; no change in diet.  No sob, diaphoresis, n/v, palp or dizziness.  Denies dysphagia, n/v, bowel change or blood. Pt denies  wheezing, orthopnea, PND, increased LE swelling, syncope.  Pt denies new neurological symptoms such as new headache, or facial or extremity weakness or numbness   Pt denies polydipsia, polyuria   Pt denies fever, wt loss, night sweats, loss of appetite, or other constitutional symptoms; and no HA, cough, diarrhea or dysuria Past Medical History:  Diagnosis Date  . Acute meniscal tear of knee LEFT KNEE  . Allergic rhinitis   . Arthritis    "knees" (02/19/2017)  . Atrial fibrillation ([place])   . Breast cancer, right breast ([place]) 1986  . 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 Diverticulitis of large intestine with perforation 11/02/2011   Microperforation probably related to nut and popcorn consumption.  Resolved by CT scan Recurrent episode clinical dx 07/2012   . Diverticulosis of colon    sigmoid  . GERD (gastroesophageal reflux disease)   . H/O hiatal hernia   . History of kidney stones    "passed it" (02/19/2017)  . Hypercholesterolemia    history  . Left knee pain    occasionally  . Palpitations IRREGULAR HEARTBEAT--  CONTROLLED W/  BETA BLOCKER  . Pneumonia 2017   "walking pneumonia" (02/19/2017)  . Pre-diabetes    pcp is monitoring a1c currently 02/10/17  . Swelling of left knee joint    Past Surgical History:  Procedure Laterality Date  . BREAST BIOPSY Right 1986  . BREAST BIOPSY Left   . BREAST RECONSTRUCTION Right 1987   W/ IMPLANTS  . CARDIAC CATHETERIZATION  12-22-2000  DR BRACKBILL   NORMAL LVF/ NORMAL CORONARY ARTERIES  . CHOLECYSTECTOMY OPEN  1988  . CHONDROPLASTY  09/08/2012   Procedure:  CHONDROPLASTY;  Surgeon: Tobi Bastos, MD;  Location[facility];  Service: Orthopedics;  Laterality: Left;  . COLONOSCOPY     last colon 05-03-2009 (02/19/2017)  . COLONOSCOPY W/ BIOPSIES AND POLYPECTOMY    . KNEE ARTHROSCOPY WITH MEDIAL MENISECTOMY  09/08/2012   Procedure: KNEE ARTHROSCOPY WITH MEDIAL MENISECTOMY;  Surgeon: Tobi Bastos, MD;  Location: [facility];  Service: Orthopedics;  Laterality: Left;  lateral tibial plateau,   . MASTECTOMY Right 1986   W/ MULTIPLE NODE DISSECTION  . REDUCTION MAMMAPLASTY Left 1987  . TOTAL KNEE ARTHROPLASTY Left 07/21/2013   Procedure: TOTAL KNEE ARTHROPLASTY;  Surgeon: Newt Minion, MD;  Location: [facility];  Service: Orthopedics;  Laterality: Left;  Left Total Knee Arthroplasty  . TOTAL KNEE ARTHROPLASTY Right 02/19/2017   Procedure: RIGHT TOTAL KNEE ARTHROPLASTY;  Surgeon: Newt Minion, MD;  Location: [facility];  Service: Orthopedics;  Laterality: Right;  Marland Kitchen VAGINAL HYSTERECTOMY  1966    reports that she has never smoked. She has never used smokeless tobacco. She reports current alcohol use of about 1.0 standard drinks of alcohol per week. She reports that she does not use drugs. family history includes Brain cancer in her sister; Cancer in her brother, sister, and sister; Colon cancer in her brother; Dementia in her maternal grandfather and sister; Heart attack in her father; Heart disease in  her daughter; Heart failure in her mother; Leukemia in her brother; Lung cancer in her sister. Allergies  Allergen Reactions  . Crestor [Rosuvastatin Calcium] Other (See Comments)    Shoulder pain  . Lipitor [Atorvastatin Calcium] Other (See Comments)    Myalgias   . Pravachol [Pravastatin] Other (See Comments)    myalgias  . Penicillins Itching    Has patient had a PCN reaction causing immediate rash, facial/tongue/throat swelling, SOB or lightheadedness with hypotension: No Has patient had a PCN reaction causing severe rash  involving mucus membranes or skin necrosis: No Has patient had a PCN reaction that required hospitalization No Has patient had a PCN reaction occurring within the last 10 years: No If all of the above answers are "NO", then may proceed with Cephalosporin use.    Current Outpatient Medications on File Prior to Visit  Medication Sig Dispense Refill  . apixaban (ELIQUIS) 5 MG TABS tablet Take 1 tablet (5 mg total) by mouth 2 (two) times daily. 60 tablet 11  . BIOTIN 5000 PO Take 5,000 mcg by mouth daily.     Marland Kitchen co-enzyme Q-10 30 MG capsule Take 30 mg by mouth 2 (two) times daily.     Marland Kitchen ezetimibe (ZETIA) 10 MG tablet Take 1 tablet by mouth once daily 90 tablet 3  . fish oil-omega-3 fatty acids 1000 MG capsule Take 1 g by mouth daily.     . furosemide (LASIX) 40 MG tablet Take 1 tablet (40 mg total) by mouth daily. 90 tablet 3  . metoprolol tartrate (LOPRESSOR) 50 MG tablet Take 1.5 tablets (75 mg total) by mouth 2 (two) times daily. 180 tablet 1  . Multiple Vitamins-Minerals (CENTRUM SILVER PO) Take 1 tablet by mouth every morning.     Marland Kitchen omeprazole (PRILOSEC) 40 MG capsule TAKE 1 CAPSULE BY MOUTH TWICE DAILY BEFORE MEAL(S) 180 capsule 0  . triamcinolone (NASACORT) 55 MCG/ACT AERO nasal inhaler Place 2 sprays into the nose daily. 1 Inhaler 12  . [DISCONTINUED] bumetanide (BUMEX) 1 MG tablet TAKE 1 TABLET (1 MG TOTAL) BY MOUTH DAILY AS NEEDED 90 tablet 1   No current facility-administered medications on file prior to visit.   Review of Systems All otherwise neg per pt    Objective:   Physical Exam BP 128/82 (BP Location: Left Arm, Patient Position: Sitting, Cuff Size: Small)   Pulse 84   Temp 98.2 F (36.8 C) (Oral)   Ht 5\' 2"  (1.575 m)   Wt 163 lb (73.9 kg)   SpO2 97%   BMI 29.81 kg/m  VS noted,  Constitutional: Pt appears in NAD HENT: Head: NCAT.  Right Ear: External ear normal.  Left Ear: External ear normal.  Eyes: . Pupils are equal, round, and reactive to light. Conjunctivae  and EOM are normal Nose: without d/c or deformity Neck: Neck supple. Gross normal ROM Cardiovascular: Normal rate and regular rhythm.   Pulmonary/Chest: Effort normal and breath sounds without rales or wheezing.  Abd:  Soft, NT, ND, + BS, no organomegaly Neurological: Pt is alert. At baseline orientation, motor grossly intact Skin: Skin is warm. No rashes, other new lesions, no LE edema Psychiatric: Pt behavior is normal without agitation  All otherwise neg per pt Lab Results  Component Value Date   WBC 8.4 02/03/2020   HGB 11.5 (L) 02/03/2020   HCT 35.7 (L) 02/03/2020   PLT 300 02/03/2020   GLUCOSE 120 (H) 02/03/2020   CHOL 197 11/09/2019   TRIG 157.0 (H) 11/09/2019   HDL  49.80 11/09/2019   LDLDIRECT 112.0 05/03/2019   LDLCALC 116 (H) 11/09/2019   ALT 36 (H) 11/09/2019   AST 27 11/09/2019   NA 138 02/03/2020   K 4.1 02/03/2020   CL 102 02/03/2020   CREATININE 1.31 (H) 02/03/2020   BUN 18 02/03/2020   CO2 24 02/03/2020   TSH 1.46 05/03/2019   INR 1.01 07/13/2013   HGBA1C 6.7 (H) 11/09/2019   MICROALBUR <0.7 05/03/2019      Assessment & Plan:

## 2020-03-02 NOTE — Patient Instructions (Signed)
Please take all new medication as prescribed - the pepcid in the evening  Please continue all other medications as before, and refills have been done if requested.  Please have the pharmacy call with any other refills you may need.  Please continue your efforts at being more active, low cholesterol diet, and weight control.  Please keep your appointments with your specialists as you may have planned  You will be contacted regarding the referral for: stress test

## 2020-03-05 ENCOUNTER — Encounter: Payer: Self-pay | Admitting: Internal Medicine

## 2020-03-05 NOTE — Assessment & Plan Note (Addendum)
Atypical, declines ecg/cxr today, cont prilosec 40 , add pepcid 40 qhs, for stress test, cont all other tx  I spent 31 minutes in preparing to see the patient by review of recent labs, imaging and procedures, obtaining and reviewing separately obtained history, communicating with the patient and family or caregiver, ordering medications, tests or procedures, and documenting clinical information in the EHR including the differential Dx, treatment, and any further evaluation and other management of cp, dm, htn

## 2020-03-05 NOTE — Assessment & Plan Note (Signed)
stable overall by history and exam, recent data reviewed with pt, and pt to continue medical treatment as before,  to f/u any worsening symptoms or concerns  

## 2020-03-09 ENCOUNTER — Telehealth (HOSPITAL_COMMUNITY): Payer: Self-pay | Admitting: Internal Medicine

## 2020-03-09 ENCOUNTER — Other Ambulatory Visit: Payer: Self-pay | Admitting: Internal Medicine

## 2020-03-09 DIAGNOSIS — R0602 Shortness of breath: Secondary | ICD-10-CM

## 2020-03-09 DIAGNOSIS — I491 Atrial premature depolarization: Secondary | ICD-10-CM

## 2020-03-09 DIAGNOSIS — I480 Paroxysmal atrial fibrillation: Secondary | ICD-10-CM

## 2020-03-09 DIAGNOSIS — R079 Chest pain, unspecified: Secondary | ICD-10-CM

## 2020-03-09 NOTE — Addendum Note (Signed)
Addended by: Biagio Borg on: 03/09/2020 02:59 PM   Modules accepted: Orders

## 2020-03-09 NOTE — Telephone Encounter (Signed)
I called patient to schedule the Exercise Myoview that you ordered and I explained that she would have to be tested for COVID and remain in quarantine until the test date and she states that she will be unable to do so because she keeps her grandchildren. I informed her to call your office to notify. We can do a Lexiscan here in the office and it does not require patient to be tested or quarantined.  Please let us know if you would like to do that test. Order will be removed from the [place] and if you would like Lexiscan please put in new order. Thank you.

## 2020-03-09 NOTE — Telephone Encounter (Signed)
Ok order changed

## 2020-03-10 ENCOUNTER — Telehealth: Payer: Self-pay | Admitting: Internal Medicine

## 2020-03-10 NOTE — Telephone Encounter (Signed)
Patient states she is returning a call to discuss heart monitor reading. Please call.

## 2020-03-10 NOTE — Telephone Encounter (Signed)
Patient reports continued SOB She would like to see MD to discuss cardioversion  Scheduled for OV on 5/13 @ 0800

## 2020-03-13 ENCOUNTER — Telehealth: Payer: Self-pay | Admitting: Internal Medicine

## 2020-03-13 ENCOUNTER — Other Ambulatory Visit: Payer: Self-pay | Admitting: Internal Medicine

## 2020-03-13 ENCOUNTER — Telehealth (HOSPITAL_COMMUNITY): Payer: Self-pay | Admitting: Internal Medicine

## 2020-03-13 NOTE — Telephone Encounter (Signed)
Patient said she was returning a call from [place] to schedule a test.

## 2020-03-13 NOTE — Telephone Encounter (Signed)
Called patient to schedule to schedule lexiscan and she states she is in afib and having a cardioversion and unable to do at this time. She will contact your office. Order will be removed from [place] and if you need her to have in the future we can reinstate the Order.

## 2020-03-16 ENCOUNTER — Ambulatory Visit: Payer: Medicare Other | Admitting: Internal Medicine

## 2020-03-16 ENCOUNTER — Encounter: Payer: Self-pay | Admitting: Internal Medicine

## 2020-03-16 ENCOUNTER — Other Ambulatory Visit: Payer: Self-pay

## 2020-03-16 VITALS — BP 114/82 | HR 97 | Ht 62.0 in | Wt 166.0 lb

## 2020-03-16 DIAGNOSIS — I4891 Unspecified atrial fibrillation: Secondary | ICD-10-CM | POA: Diagnosis not present

## 2020-03-16 DIAGNOSIS — R0602 Shortness of breath: Secondary | ICD-10-CM | POA: Diagnosis not present

## 2020-03-16 DIAGNOSIS — Z01812 Encounter for preprocedural laboratory examination: Secondary | ICD-10-CM | POA: Diagnosis not present

## 2020-03-16 LAB — CBC
Hematocrit: 36.6 % (ref 34.0–46.6)
Hemoglobin: 12.2 g/dL (ref 11.1–15.9)
MCH: 31.3 pg (ref 26.6–33.0)
MCHC: 33.3 g/dL (ref 31.5–35.7)
MCV: 94 fL (ref 79–97)
Platelets: 310 10*3/uL (ref 150–450)
RBC: 3.9 x10E6/uL (ref 3.77–5.28)
RDW: 11.9 % (ref 11.7–15.4)
WBC: 8.3 10*3/uL (ref 3.4–10.8)

## 2020-03-16 LAB — BASIC METABOLIC PANEL
BUN/Creatinine Ratio: 17 (ref 12–28)
BUN: 18 mg/dL (ref 8–27)
CO2: 22 mmol/L (ref 20–29)
Calcium: 9.9 mg/dL (ref 8.7–10.3)
Chloride: 96 mmol/L (ref 96–106)
Creatinine, Ser: 1.06 mg/dL — ABNORMAL HIGH (ref 0.57–1.00)
GFR calc Af Amer: 58 mL/min/{1.73_m2} — ABNORMAL LOW (ref >59)
GFR calc non Af Amer: 50 mL/min/{1.73_m2} — ABNORMAL LOW (ref >59)
Glucose: 124 mg/dL — ABNORMAL HIGH (ref 65–99)
Potassium: 5.7 mmol/L — ABNORMAL HIGH (ref 3.5–5.2)
Sodium: 135 mmol/L (ref 134–144)

## 2020-03-16 NOTE — Progress Notes (Signed)
OFFICE NOTE  Chief Complaint:  Follow-up A. fib, shortness of breath   Primary Care Physician: Leslie Borg, MD  HPI:  Leslie Bryant is a 79 y.o. female who is a former patient of Dr. Mare Bryant. She is followed along with him for a number years and he also followed her husband who unfortunately died. She has no known coronary artery disease. She had heart catheterization 2002 which showed essentially normal coronaries. She's been followed for palpitations, history of PSVT which is been well controlled and dyslipidemia. Recently she was seen in the emergency department after working strenuously in the yard on a very hot day. She felt some fluttering in her chest and was noted to have some unifocal PVCs on a monitor. Recently her metoprolol was increased to 50 mg twice a day. She was advised to decrease caffeine intake. Since that time she's had no further recurrence and felt like it may be related to working outside in the hot weather. Blood pressure is well-controlled today. She has a history of intolerance to statins. Recently she had had some pain in her legs and thought it was related to study of. She was taken off that medicine and listed as an allergy however her symptoms did not change therefore she is interested in going back on the medicine. While she was on Zetia her LDL was 120 which is reasonable control.   12/24/2016 Leslie Bryant returns a for follow-up. Overall she seems to be doing well although suffering from some right knee pain. She told me that she intends to have right knee surgery hopefully in April with Dr. Sharol Given. She is asking for cardiovascular clearance. She denies any chest pain or worsening shortness of breath. Her blood pressure is well-controlled. She has been tolerant of ezetimibe 10 mg daily for cholesterol and recently her LDL was less than 80. She's previous he been intolerant of pravastatin, atorvastatin and rosuvastatin.  12/17/2017  Mrs. Leslie Bryant is seen today  in follow-up.  She was recently in the emergency department beginning of February with some palpitations.  She also was describing some chest pain.  It was felt that this was more likely acid reflux.  Her PPI was doubled and her symptoms have improved.  She was noted to have PVCs, but this is known in her history.  She says her palpitations have improved and may be a lot related to stress.  We discussed possibility of using extra metoprolol or increasing her dose if necessary.  05/24/2019  Ms. Leslie Bryant is seen today in follow-up.  Overall she reports good control in her palpitations after we further increased her metoprolol to 50 mg twice daily.  Blood pressures generally in the 427-062 systolic range however was elevated this morning.  She reported she did not take her beta-blocker this morning.  She has normal sinus rhythm on EKG at 91.  She denies any chest pain or shortness of breath.  02/04/2020  Leslie Bryant returns today for a follow-up.  This is an acute visit as she was seen in the ER yesterday for shortness of breath.  She says has been building for about a couple weeks and was noted to be somewhat orthopneic requiring an extra pillow to elevate her head at night, short of breath and having a mild nonproductive cough.  In the ER she was found to have some mild interstitial edema on her chest x-ray and mildly elevated BNP just above 150.  She was placed on Lasix 40 mg daily  and told to follow-up with me today.  She reports perhaps a mild improvement in her breathing and weight is noted to be down a couple of pounds.  She denies any chest pain.  She has had both Covid vaccines and does not report any known Covid exposure.  This was not tested.  She also had a negative troponin.  She also had recent lipids showing total cholesterol 197, triglycerides 157, HDL 49 and LDL 116, somewhat improved only on ezetimibe monotherapy.  02/24/2020  Leslie Bryant is seen today in follow-up.  Her echo revealed the possibility  of atrial fibrillation.  We placed her on a monitor which did indeed demonstrate atrial fibrillation.  She was referred for some reason to the A. fib clinic and started on Eliquis.  She was seen by Adline Peals, PA-C who also increased her beta-blocker.  She returns today stating she is feeling pretty well.  She is in A. fib today.  She has been wearing it 2-week ZIO monitor which she is wearing today and will be turning it in.  Hopefully will have results to review next week to see what her burden of A. fib is.  I do believe is still paroxysmal which case there is probably little role for her cardioversion.  She is on Eliquis and tolerating it well.  Heart rate control is reasonable at 90 today.  She does report some improvement in her dyspnea and had perhaps some mild diastolic heart failure.  EF was normal on echo.   03/16/2020  Leslie Bryant returns today for follow-up.  She reports some persistent shortness of breath.  After wearing a 2-week ZIO monitor she had a very high burden of A. fib of around 95% this has been more persistent towards the latter part of the monitoring period.  Your EKG today shows atrial fibrillation.  She reports some dyspnea although yesterday she said she felt a little better.  I think would be reasonable to consider a cardioversion.  She has been anticoagulated on Eliquis without missed doses.  PMHx:  Past Medical History:  Diagnosis Date  . Acute meniscal tear of knee LEFT KNEE  . Allergic rhinitis   . Arthritis    "knees" (02/19/2017)  . Atrial fibrillation ([place])   . Breast cancer, right breast ([place]) 1986  . 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 Diverticulitis of large intestine with perforation 11/02/2011   Microperforation probably related to nut and popcorn consumption.  Resolved by CT scan Recurrent episode clinical dx 07/2012   . Diverticulosis of colon    sigmoid  . GERD (gastroesophageal reflux disease)   . H/O hiatal hernia   . History of kidney stones     "passed it" (02/19/2017)  . Hypercholesterolemia    history  . Left knee pain    occasionally  . Palpitations IRREGULAR HEARTBEAT--  CONTROLLED W/  BETA BLOCKER  . Pneumonia 2017   "walking pneumonia" (02/19/2017)  . Pre-diabetes    pcp is monitoring a1c currently 02/10/17  . Swelling of left knee joint     Past Surgical History:  Procedure Laterality Date  . BREAST BIOPSY Right 1986  . BREAST BIOPSY Left   . BREAST RECONSTRUCTION Right 1987   W/ IMPLANTS  . CARDIAC CATHETERIZATION  12-22-2000  DR BRACKBILL   NORMAL LVF/ NORMAL CORONARY ARTERIES  . CHOLECYSTECTOMY OPEN  1988  . CHONDROPLASTY  09/08/2012   Procedure: CHONDROPLASTY;  Surgeon: Tobi Bastos, MD;  Location: [facility];  Service: Orthopedics;  Laterality: Left;  . COLONOSCOPY     last colon 05-03-2009 (02/19/2017)  . COLONOSCOPY W/ BIOPSIES AND POLYPECTOMY    . KNEE ARTHROSCOPY WITH MEDIAL MENISECTOMY  09/08/2012   Procedure: KNEE ARTHROSCOPY WITH MEDIAL MENISECTOMY;  Surgeon: Tobi Bastos, MD;  Location: [facility];  Service: Orthopedics;  Laterality: Left;  lateral tibial plateau,   . MASTECTOMY Right 1986   W/ MULTIPLE NODE DISSECTION  . REDUCTION MAMMAPLASTY Left 1987  . TOTAL KNEE ARTHROPLASTY Left 07/21/2013   Procedure: TOTAL KNEE ARTHROPLASTY;  Surgeon: Newt Minion, MD;  Location: [facility];  Service: Orthopedics;  Laterality: Left;  Left Total Knee Arthroplasty  . TOTAL KNEE ARTHROPLASTY Right 02/19/2017   Procedure: RIGHT TOTAL KNEE ARTHROPLASTY;  Surgeon: Newt Minion, MD;  Location: [facility];  Service: Orthopedics;  Laterality: Right;  Marland Kitchen VAGINAL HYSTERECTOMY  1966    FAMHx:  Family History  Problem Relation Age of Onset  . Heart attack Father   . Heart failure Mother        CHF  . Dementia Maternal Grandfather   . Dementia Sister   . Colon cancer Brother        died/age 39  . Lung cancer Sister   . Brain cancer Sister   . Cancer Brother        mouth  . Leukemia  Brother   . Cancer Sister        type unknown  . Cancer Sister        type unknown  . Heart disease Daughter     SOCHx:   reports that she has never smoked. She has never used smokeless tobacco. She reports current alcohol use of about 1.0 standard drinks of alcohol per week. She reports that she does not use drugs.  ALLERGIES:  Allergies  Allergen Reactions  . Crestor [Rosuvastatin Calcium] Other (See Comments)    Shoulder pain  . Lipitor [Atorvastatin Calcium] Other (See Comments)    Myalgias   . Pravachol [Pravastatin] Other (See Comments)    myalgias  . Penicillins Itching    Has patient had a PCN reaction causing immediate rash, facial/tongue/throat swelling, SOB or lightheadedness with hypotension: No Has patient had a PCN reaction causing severe rash involving mucus membranes or skin necrosis: No Has patient had a PCN reaction that required hospitalization No Has patient had a PCN reaction occurring within the last 10 years: No If all of the above answers are "NO", then may proceed with Cephalosporin use.     ROS: Pertinent items noted in HPI and remainder of comprehensive ROS otherwise negative.  HOME MEDS: Current Outpatient Medications  Medication Sig Dispense Refill  . apixaban (ELIQUIS) 5 MG TABS tablet Take 1 tablet (5 mg total) by mouth 2 (two) times daily. 60 tablet 11  . BIOTIN 5000 PO Take 5,000 mcg by mouth daily.     Marland Kitchen co-enzyme Q-10 30 MG capsule Take 30 mg by mouth 2 (two) times daily.     Marland Kitchen ezetimibe (ZETIA) 10 MG tablet Take 1 tablet by mouth once daily 90 tablet 3  . famotidine (PEPCID) 40 MG tablet Take 1 tablet (40 mg total) by mouth at bedtime. 90 tablet 3  . fish oil-omega-3 fatty acids 1000 MG capsule Take 1 g by mouth daily.     . furosemide (LASIX) 40 MG tablet Take 1 tablet (40 mg total) by mouth daily. 90 tablet 3  . metoprolol tartrate (LOPRESSOR) 50 MG  tablet Take 1.5 tablets (75 mg total) by mouth 2 (two) times daily. 180 tablet 1  .  Multiple Vitamins-Minerals (CENTRUM SILVER PO) Take 1 tablet by mouth every morning.     Marland Kitchen omeprazole (PRILOSEC) 40 MG capsule TAKE 1 CAPSULE BY MOUTH TWICE DAILY BEFORE MEAL(S) 180 capsule 0  . triamcinolone (NASACORT) 55 MCG/ACT AERO nasal inhaler Place 2 sprays into the nose daily. 1 Inhaler 12   No current facility-administered medications for this visit.    LABS/IMAGING: No results found for this or any previous visit (from the past 48 hour(s)). No results found.  WEIGHTS: Wt Readings from Last 3 Encounters:  03/16/20 166 lb (75.3 kg)  03/02/20 163 lb (73.9 kg)  02/24/20 166 lb (75.3 kg)    VITALS: BP 114/82   Pulse 97   Ht 5\' 2"  (1.575 m)   Wt 166 lb (75.3 kg)   SpO2 97%   BMI 30.36 kg/m   EXAM: General appearance: alert and no distress Neck: no carotid bruit and no JVD Lungs: clear to auscultation bilaterally Heart: regular rate and rhythm and systolic murmur: early systolic 2/6, crescendo at 2nd right intercostal space Abdomen: soft, non-tender; bowel sounds normal; no masses,  no organomegaly Extremities: extremities normal, atraumatic, no cyanosis or edema Pulses: 2+ and symmetric Skin: Skin color, texture, turgor normal. No rashes or lesions Neurologic: Grossly normal Psych: Pleasant  EKG: A. fib at 97, low voltage QRS-personally reviewed  ASSESSMENT: 1. Newly recognized atrial fibrillation-CHA2DS2-VASc score of 4 on Eliquis 2. Dyspnea on exertion - probably mild diastolic CHF, LVEF 94-17% (02/2020) 3. Palpitations-history of PSVT and occasional PVCs 4. Dyslipidemia-intolerant to statins 5. Essential hypertension 6. GERD  PLAN: 1.   Mrs. Macaluso has a high burden of A. fib and is essentially in persistent A. fib over the past week and a half.  Her EKG now shows A. fib today.  She has been dyspneic which I suspect is related to this.  I think it is reasonable to consider an elective cardioversion.  If she feels better afterwards then rhythm control would be  imperative.  She might ultimately need antiarrhythmic therapy.  Plan to continue Eliquis without missed doses.  Will arrange for outpatient elective cardioversion.  Discussed the risks and benefits of that procedure today with her and she is agreeable to proceed.  Follow-up with me afterwards.  Pixie Casino, MD, [facility], [place] Director of the Advanced Lipid Disorders &  Cardiovascular Risk Reduction Clinic Diplomate of the American Board of Clinical Lipidology Attending Cardiologist  Direct Dial: [phone]  Fax: [phone]  Website:  [email or web address] 03/16/2020, 8:17 AM

## 2020-03-16 NOTE — Patient Instructions (Signed)
Medication Instructions:  Your physician recommends that you continue on your current medications as directed. Please refer to the Current Medication list given to you today.  *If you need a refill on your cardiac medications before your next appointment, please call your pharmacy*   Lab Work: BMET & CBC today  You will need to have the coronavirus test completed prior to your procedure. An appointment has been made at 3pm on Monday 5/17. This is a Drive Up Visit at the [facility] [address] Please tell them that you are there for pre-procedure testing. Someone will direct you to the appropriate testing line. Stay in your car and someone will be with you shortly. Please make sure to have all other labs completed before this test because you will need to stay quarantined until your procedure. Please take your insurance card to this test.   If you have labs (blood work) drawn today and your tests are completely normal, you will receive your results only by: Marland Kitchen MyChart Message (if you have MyChart) OR . A paper copy in the mail If you have any lab test that is abnormal or we need to change your treatment, we will call you to review the results.   Testing/Procedures: Cardioversion @ [facility] on Thursday 03/23/2020 with Dr. Oswaldo Milian   Follow-Up: At [facility], you and your health needs are our priority.  As part of our continuing mission to provide you with exceptional heart care, we have created designated Provider Care Teams.  These Care Teams include your primary Cardiologist (physician) and Advanced Practice Providers (APPs -  Physician Assistants and Nurse Practitioners) who all work together to provide you with the care you need, when you need it.  We recommend signing up for the patient portal called "MyChart".  Sign up information is provided on this After Visit Summary.  MyChart is used to connect with patients for Virtual Visits (Telemedicine).   Patients are able to view lab/test results, encounter notes, upcoming appointments, etc.  Non-urgent messages can be sent to your provider as well.   To learn more about what you can do with MyChart, go to [email or web address].    Your next appointment:   3-4 week(s) - after cardioversion   The format for your next appointment:   In Person  Provider:   You may see Pixie Casino, MD or one of the following Advanced Practice Providers on your designated Care Team:    Almyra Deforest, PA-C  Fabian Sharp, PA-C or   Roby Lofts, [place]    Other Instructions

## 2020-03-16 NOTE — H&P (View-Only) (Signed)
OFFICE NOTE  Chief Complaint:  Follow-up A. fib, shortness of breath   Primary Care Physician: Leslie Borg, MD  HPI:  Leslie Bryant is a 79 y.o. female who is a former patient of Dr. Mare Ferrari. She is followed along with him for a number years and he also followed her husband who unfortunately died. She has no known coronary artery disease. She had heart catheterization 2002 which showed essentially normal coronaries. She's been followed for palpitations, history of PSVT which is been well controlled and dyslipidemia. Recently she was seen in the emergency department after working strenuously in the yard on a very hot day. She felt some fluttering in her chest and was noted to have some unifocal PVCs on a monitor. Recently her metoprolol was increased to 50 mg twice a day. She was advised to decrease caffeine intake. Since that time she's had no further recurrence and felt like it may be related to working outside in the hot weather. Blood pressure is well-controlled today. She has a history of intolerance to statins. Recently she had had some pain in her legs and thought it was related to study of. She was taken off that medicine and listed as an allergy however her symptoms did not change therefore she is interested in going back on the medicine. While she was on Zetia her LDL was 120 which is reasonable control.   12/24/2016 Leslie Bryant returns a for follow-up. Overall she seems to be doing well although suffering from some right knee pain. She told me that she intends to have right knee surgery hopefully in April with Dr. Sharol Given. She is asking for cardiovascular clearance. She denies any chest pain or worsening shortness of breath. Her blood pressure is well-controlled. She has been tolerant of ezetimibe 10 mg daily for cholesterol and recently her LDL was less than 80. She's previous he been intolerant of pravastatin, atorvastatin and rosuvastatin.  12/17/2017  Leslie Bryant is seen today  in follow-up.  She was recently in the emergency department beginning of February with some palpitations.  She also was describing some chest pain.  It was felt that this was more likely acid reflux.  Her PPI was doubled and her symptoms have improved.  She was noted to have PVCs, but this is known in her history.  She says her palpitations have improved and may be a lot related to stress.  We discussed possibility of using extra metoprolol or increasing her dose if necessary.  05/24/2019  Leslie Bryant is seen today in follow-up.  Overall she reports good control in her palpitations after we further increased her metoprolol to 50 mg twice daily.  Blood pressures generally in the 741-638 systolic range however was elevated this morning.  She reported she did not take her beta-blocker this morning.  She has normal sinus rhythm on EKG at 91.  She denies any chest pain or shortness of breath.  02/04/2020  Leslie Bryant returns today for a follow-up.  This is an acute visit as she was seen in the ER yesterday for shortness of breath.  She says has been building for about a couple weeks and was noted to be somewhat orthopneic requiring an extra pillow to elevate her head at night, short of breath and having a mild nonproductive cough.  In the ER she was found to have some mild interstitial edema on her chest x-ray and mildly elevated BNP just above 150.  She was placed on Lasix 40 mg daily  and told to follow-up with me today.  She reports perhaps a mild improvement in her breathing and weight is noted to be down a couple of pounds.  She denies any chest pain.  She has had both Covid vaccines and does not report any known Covid exposure.  This was not tested.  She also had a negative troponin.  She also had recent lipids showing total cholesterol 197, triglycerides 157, HDL 49 and LDL 116, somewhat improved only on ezetimibe monotherapy.  02/24/2020  Leslie Bryant is seen today in follow-up.  Her echo revealed the possibility  of atrial fibrillation.  We placed her on a monitor which did indeed demonstrate atrial fibrillation.  She was referred for some reason to the A. fib clinic and started on Eliquis.  She was seen by Adline Peals, PA-C who also increased her beta-blocker.  She returns today stating she is feeling pretty well.  She is in A. fib today.  She has been wearing it 2-week ZIO monitor which she is wearing today and will be turning it in.  Hopefully will have results to review next week to see what her burden of A. fib is.  I do believe is still paroxysmal which case there is probably little role for her cardioversion.  She is on Eliquis and tolerating it well.  Heart rate control is reasonable at 90 today.  She does report some improvement in her dyspnea and had perhaps some mild diastolic heart failure.  EF was normal on echo.   03/16/2020  Leslie Bryant returns today for follow-up.  She reports some persistent shortness of breath.  After wearing a 2-week ZIO monitor she had a very high burden of A. fib of around 95% this has been more persistent towards the latter part of the monitoring period.  Your EKG today shows atrial fibrillation.  She reports some dyspnea although yesterday she said she felt a little better.  I think would be reasonable to consider a cardioversion.  She has been anticoagulated on Eliquis without missed doses.  PMHx:  Past Medical History:  Diagnosis Date  . Acute meniscal tear of knee LEFT KNEE  . Allergic rhinitis   . Arthritis    "knees" (02/19/2017)  . Atrial fibrillation ([place])   . Breast cancer, right breast ([place]) 1986  . 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 Diverticulitis of large intestine with perforation 11/02/2011   Microperforation probably related to nut and popcorn consumption.  Resolved by CT scan Recurrent episode clinical dx 07/2012   . Diverticulosis of colon    sigmoid  . GERD (gastroesophageal reflux disease)   . H/O hiatal hernia   . History of kidney stones     "passed it" (02/19/2017)  . Hypercholesterolemia    history  . Left knee pain    occasionally  . Palpitations IRREGULAR HEARTBEAT--  CONTROLLED W/  BETA BLOCKER  . Pneumonia 2017   "walking pneumonia" (02/19/2017)  . Pre-diabetes    pcp is monitoring a1c currently 02/10/17  . Swelling of left knee joint     Past Surgical History:  Procedure Laterality Date  . BREAST BIOPSY Right 1986  . BREAST BIOPSY Left   . BREAST RECONSTRUCTION Right 1987   W/ IMPLANTS  . CARDIAC CATHETERIZATION  12-22-2000  DR BRACKBILL   NORMAL LVF/ NORMAL CORONARY ARTERIES  . CHOLECYSTECTOMY OPEN  1988  . CHONDROPLASTY  09/08/2012   Procedure: CHONDROPLASTY;  Surgeon: Tobi Bastos, MD;  Location: [facility];  Service: Orthopedics;  Laterality: Left;  . COLONOSCOPY     last colon 05-03-2009 (02/19/2017)  . COLONOSCOPY W/ BIOPSIES AND POLYPECTOMY    . KNEE ARTHROSCOPY WITH MEDIAL MENISECTOMY  09/08/2012   Procedure: KNEE ARTHROSCOPY WITH MEDIAL MENISECTOMY;  Surgeon: Tobi Bastos, MD;  Location: [facility];  Service: Orthopedics;  Laterality: Left;  lateral tibial plateau,   . MASTECTOMY Right 1986   W/ MULTIPLE NODE DISSECTION  . REDUCTION MAMMAPLASTY Left 1987  . TOTAL KNEE ARTHROPLASTY Left 07/21/2013   Procedure: TOTAL KNEE ARTHROPLASTY;  Surgeon: Newt Minion, MD;  Location: [facility];  Service: Orthopedics;  Laterality: Left;  Left Total Knee Arthroplasty  . TOTAL KNEE ARTHROPLASTY Right 02/19/2017   Procedure: RIGHT TOTAL KNEE ARTHROPLASTY;  Surgeon: Newt Minion, MD;  Location: [facility];  Service: Orthopedics;  Laterality: Right;  Marland Kitchen VAGINAL HYSTERECTOMY  1966    FAMHx:  Family History  Problem Relation Age of Onset  . Heart attack Father   . Heart failure Mother        CHF  . Dementia Maternal Grandfather   . Dementia Sister   . Colon cancer Brother        died/age 59  . Lung cancer Sister   . Brain cancer Sister   . Cancer Brother        mouth  . Leukemia  Brother   . Cancer Sister        type unknown  . Cancer Sister        type unknown  . Heart disease Daughter     SOCHx:   reports that she has never smoked. She has never used smokeless tobacco. She reports current alcohol use of about 1.0 standard drinks of alcohol per week. She reports that she does not use drugs.  ALLERGIES:  Allergies  Allergen Reactions  . Crestor [Rosuvastatin Calcium] Other (See Comments)    Shoulder pain  . Lipitor [Atorvastatin Calcium] Other (See Comments)    Myalgias   . Pravachol [Pravastatin] Other (See Comments)    myalgias  . Penicillins Itching    Has patient had a PCN reaction causing immediate rash, facial/tongue/throat swelling, SOB or lightheadedness with hypotension: No Has patient had a PCN reaction causing severe rash involving mucus membranes or skin necrosis: No Has patient had a PCN reaction that required hospitalization No Has patient had a PCN reaction occurring within the last 10 years: No If all of the above answers are "NO", then may proceed with Cephalosporin use.     ROS: Pertinent items noted in HPI and remainder of comprehensive ROS otherwise negative.  HOME MEDS: Current Outpatient Medications  Medication Sig Dispense Refill  . apixaban (ELIQUIS) 5 MG TABS tablet Take 1 tablet (5 mg total) by mouth 2 (two) times daily. 60 tablet 11  . BIOTIN 5000 PO Take 5,000 mcg by mouth daily.     Marland Kitchen co-enzyme Q-10 30 MG capsule Take 30 mg by mouth 2 (two) times daily.     Marland Kitchen ezetimibe (ZETIA) 10 MG tablet Take 1 tablet by mouth once daily 90 tablet 3  . famotidine (PEPCID) 40 MG tablet Take 1 tablet (40 mg total) by mouth at bedtime. 90 tablet 3  . fish oil-omega-3 fatty acids 1000 MG capsule Take 1 g by mouth daily.     . furosemide (LASIX) 40 MG tablet Take 1 tablet (40 mg total) by mouth daily. 90 tablet 3  . metoprolol tartrate (LOPRESSOR) 50 MG  tablet Take 1.5 tablets (75 mg total) by mouth 2 (two) times daily. 180 tablet 1  .  Multiple Vitamins-Minerals (CENTRUM SILVER PO) Take 1 tablet by mouth every morning.     Marland Kitchen omeprazole (PRILOSEC) 40 MG capsule TAKE 1 CAPSULE BY MOUTH TWICE DAILY BEFORE MEAL(S) 180 capsule 0  . triamcinolone (NASACORT) 55 MCG/ACT AERO nasal inhaler Place 2 sprays into the nose daily. 1 Inhaler 12   No current facility-administered medications for this visit.    LABS/IMAGING: No results found for this or any previous visit (from the past 48 hour(s)). No results found.  WEIGHTS: Wt Readings from Last 3 Encounters:  03/16/20 166 lb (75.3 kg)  03/02/20 163 lb (73.9 kg)  02/24/20 166 lb (75.3 kg)    VITALS: BP 114/82   Pulse 97   Ht 5\' 2"  (1.575 m)   Wt 166 lb (75.3 kg)   SpO2 97%   BMI 30.36 kg/m   EXAM: General appearance: alert and no distress Neck: no carotid bruit and no JVD Lungs: clear to auscultation bilaterally Heart: regular rate and rhythm and systolic murmur: early systolic 2/6, crescendo at 2nd right intercostal space Abdomen: soft, non-tender; bowel sounds normal; no masses,  no organomegaly Extremities: extremities normal, atraumatic, no cyanosis or edema Pulses: 2+ and symmetric Skin: Skin color, texture, turgor normal. No rashes or lesions Neurologic: Grossly normal Psych: Pleasant  EKG: A. fib at 97, low voltage QRS-personally reviewed  ASSESSMENT: 1. Newly recognized atrial fibrillation-CHA2DS2-VASc score of 4 on Eliquis 2. Dyspnea on exertion - probably mild diastolic CHF, LVEF 74-71% (02/2020) 3. Palpitations-history of PSVT and occasional PVCs 4. Dyslipidemia-intolerant to statins 5. Essential hypertension 6. GERD  PLAN: 1.   Mrs. Jiggetts has a high burden of A. fib and is essentially in persistent A. fib over the past week and a half.  Her EKG now shows A. fib today.  She has been dyspneic which I suspect is related to this.  I think it is reasonable to consider an elective cardioversion.  If she feels better afterwards then rhythm control would be  imperative.  She might ultimately need antiarrhythmic therapy.  Plan to continue Eliquis without missed doses.  Will arrange for outpatient elective cardioversion.  Discussed the risks and benefits of that procedure today with her and she is agreeable to proceed.  Follow-up with me afterwards.  Pixie Casino, MD, [facility], [place] Director of the Advanced Lipid Disorders &  Cardiovascular Risk Reduction Clinic Diplomate of the American Board of Clinical Lipidology Attending Cardiologist  Direct Dial: [phone]  Fax: [phone]  Website:  [email or web address] 03/16/2020, 8:17 AM

## 2020-03-20 ENCOUNTER — Other Ambulatory Visit: Payer: Self-pay | Admitting: Internal Medicine

## 2020-03-20 ENCOUNTER — Other Ambulatory Visit: Payer: Self-pay

## 2020-03-20 ENCOUNTER — Ambulatory Visit: Payer: Medicare Other | Admitting: Physician Assistant

## 2020-03-20 ENCOUNTER — Encounter: Payer: Self-pay | Admitting: Physician Assistant

## 2020-03-20 ENCOUNTER — Other Ambulatory Visit (HOSPITAL_COMMUNITY)
Admission: RE | Admit: 2020-03-20 | Discharge: 2020-03-20 | Disposition: A | Discharge status: Home / Self Care | Payer: Medicare Other | Source: Ambulatory Visit | Attending: Cardiology | Admitting: Cardiology

## 2020-03-20 DIAGNOSIS — Z20822 Contact with and (suspected) exposure to covid-19: Secondary | ICD-10-CM | POA: Diagnosis not present

## 2020-03-20 DIAGNOSIS — I4891 Unspecified atrial fibrillation: Secondary | ICD-10-CM

## 2020-03-20 DIAGNOSIS — L820 Inflamed seborrheic keratosis: Secondary | ICD-10-CM

## 2020-03-20 DIAGNOSIS — L659 Nonscarring hair loss, unspecified: Secondary | ICD-10-CM | POA: Diagnosis not present

## 2020-03-20 DIAGNOSIS — Z01812 Encounter for preprocedural laboratory examination: Secondary | ICD-10-CM | POA: Diagnosis not present

## 2020-03-20 NOTE — Progress Notes (Signed)
   Follow up Visit  Subjective  Leslie Bryant is a 79 y.o. female who presents for the following: Annual Exam (Just little brown crusty spots on arms legs and chest.. Also raised mole on back recheck it.). Last visit was in 2016. She has rough brown spots on her arms that she wants to pick at. She picks them off but they seem to grow back. No history of skin cancer. She also has been having hair loss for the past 6 or more months. It seems to be generally thin. She also feels tired a lot of the time. Her scalp itches but doesn't flake.    Objective  Well appearing patient in no apparent distress; mood and affect are within normal limits.  All skin waist up examined. No suspicious moles noted on back.   Objective  Mid Parietal Scalp: General thinning of hair. Scalp appears normal. No scale.  Objective  Chest - Medial (Center) (3), Left Temporal Scalp, Left Upper Arm - Anterior (3), Right Forearm - Anterior (3), Right Shoulder - Anterior: Erythematous stuck-on, waxy papule or plaque.   Assessment & Plan  Alopecia Mid Parietal Scalp  Reviewed her labs on epic. Her vit D and thyroid appear to be normal. She has run on the anemic side for a year now and says she also feels run down and tired a lot of the time. I told her to address this with her pcp to see if they would suggest an iron supplement and to redo the labs with a ferritin in 3 months.  Also suggested she start the Nioxin sampoo, conditioner, treatment combo.  Inflamed seborrheic keratosis (11) Right Shoulder - Anterior; Left Upper Arm - Anterior (3); Right Forearm - Anterior (3); Chest - Medial (Center) (3); Left Temporal Scalp  Destruction of lesion - Chest - Medial (Center), Left Temporal Scalp, Left Upper Arm - Anterior, Right Forearm - Anterior, Right Shoulder - Anterior Complexity: simple   Destruction method: cryotherapy   Informed consent: discussed and consent obtained   Timeout:  patient name, date of birth,  surgical site, and procedure verified Lesion destroyed using liquid nitrogen: Yes   Outcome: patient tolerated procedure well with no complications

## 2020-03-20 NOTE — Progress Notes (Signed)
History of tanning 30 plus years ago.

## 2020-03-21 LAB — SARS CORONAVIRUS 2 (TAT 6-24 HRS): SARS Coronavirus 2: NEGATIVE

## 2020-03-23 ENCOUNTER — Ambulatory Visit (HOSPITAL_COMMUNITY): Payer: Medicare Other | Admitting: Anesthesiology

## 2020-03-23 ENCOUNTER — Other Ambulatory Visit: Payer: Self-pay

## 2020-03-23 ENCOUNTER — Encounter (HOSPITAL_COMMUNITY): Payer: Self-pay | Admitting: Cardiology

## 2020-03-23 ENCOUNTER — Encounter (HOSPITAL_COMMUNITY)
Admission: RE | Disposition: A | Discharge status: Home / Self Care | Payer: Self-pay | Source: Home / Self Care | Attending: Cardiology

## 2020-03-23 ENCOUNTER — Ambulatory Visit (HOSPITAL_COMMUNITY)
Admission: RE | Admit: 2020-03-23 | Discharge: 2020-03-23 | Disposition: A | Discharge status: Home / Self Care | Payer: Medicare Other | Attending: Cardiology | Admitting: Cardiology

## 2020-03-23 DIAGNOSIS — E7800 Pure hypercholesterolemia, unspecified: Secondary | ICD-10-CM | POA: Insufficient documentation

## 2020-03-23 DIAGNOSIS — I4819 Other persistent atrial fibrillation: Secondary | ICD-10-CM | POA: Insufficient documentation

## 2020-03-23 DIAGNOSIS — Z853 Personal history of malignant neoplasm of breast: Secondary | ICD-10-CM | POA: Diagnosis not present

## 2020-03-23 DIAGNOSIS — Z880 Allergy status to penicillin: Secondary | ICD-10-CM | POA: Diagnosis not present

## 2020-03-23 DIAGNOSIS — Z7901 Long term (current) use of anticoagulants: Secondary | ICD-10-CM | POA: Insufficient documentation

## 2020-03-23 DIAGNOSIS — R002 Palpitations: Secondary | ICD-10-CM | POA: Insufficient documentation

## 2020-03-23 DIAGNOSIS — I4891 Unspecified atrial fibrillation: Secondary | ICD-10-CM

## 2020-03-23 DIAGNOSIS — R7303 Prediabetes: Secondary | ICD-10-CM | POA: Insufficient documentation

## 2020-03-23 DIAGNOSIS — R0609 Other forms of dyspnea: Secondary | ICD-10-CM | POA: Diagnosis not present

## 2020-03-23 DIAGNOSIS — Z79899 Other long term (current) drug therapy: Secondary | ICD-10-CM | POA: Diagnosis not present

## 2020-03-23 DIAGNOSIS — I480 Paroxysmal atrial fibrillation: Secondary | ICD-10-CM | POA: Diagnosis not present

## 2020-03-23 DIAGNOSIS — E785 Hyperlipidemia, unspecified: Secondary | ICD-10-CM | POA: Diagnosis not present

## 2020-03-23 DIAGNOSIS — M1711 Unilateral primary osteoarthritis, right knee: Secondary | ICD-10-CM | POA: Diagnosis not present

## 2020-03-23 DIAGNOSIS — I10 Essential (primary) hypertension: Secondary | ICD-10-CM | POA: Insufficient documentation

## 2020-03-23 DIAGNOSIS — K219 Gastro-esophageal reflux disease without esophagitis: Secondary | ICD-10-CM | POA: Diagnosis not present

## 2020-03-23 DIAGNOSIS — R0602 Shortness of breath: Secondary | ICD-10-CM | POA: Diagnosis not present

## 2020-03-23 HISTORY — PX: CARDIOVERSION: SHX1299

## 2020-03-23 SURGERY — CARDIOVERSION
Anesthesia: General

## 2020-03-23 MED ORDER — SODIUM CHLORIDE 0.9 % IV SOLN
INTRAVENOUS | Status: DC
  Administered 2020-03-23: 500 mL via INTRAVENOUS

## 2020-03-23 MED ORDER — LIDOCAINE 2% (20 MG/ML) 5 ML SYRINGE
INTRAMUSCULAR | Status: DC | PRN
  Administered 2020-03-23: 60 mg via INTRAVENOUS

## 2020-03-23 MED ORDER — PROPOFOL 10 MG/ML IV BOLUS
INTRAVENOUS | Status: DC | PRN
  Administered 2020-03-23: 30 mg via INTRAVENOUS
  Administered 2020-03-23: 60 mg via INTRAVENOUS

## 2020-03-23 NOTE — Anesthesia Preprocedure Evaluation (Addendum)
Anesthesia Evaluation  Patient identified by MRN, date of birth, ID band Patient awake    Reviewed: Allergy & Precautions, H&P , NPO status , Patient's Chart, lab work & pertinent test results, reviewed documented beta blocker date and time   Airway Mallampati: II  TM Distance: >3 FB Neck ROM: Full    Dental no notable dental hx. (+) Teeth Intact, Dental Advisory Given   Pulmonary neg pulmonary ROS,    Pulmonary exam normal breath sounds clear to auscultation       Cardiovascular + dysrhythmias Atrial Fibrillation  Rhythm:Irregular Rate:Normal     Neuro/Psych Anxiety negative neurological ROS     GI/Hepatic Neg liver ROS, hiatal hernia, GERD  Medicated,  Endo/Other  negative endocrine ROS  Renal/GU Renal disease  negative genitourinary   Musculoskeletal  (+) Arthritis , Osteoarthritis,    Abdominal   Peds  Hematology  (+) Blood dyscrasia, anemia ,   Anesthesia Other Findings   Reproductive/Obstetrics negative OB ROS                            Anesthesia Physical Anesthesia Plan  ASA: III  Anesthesia Plan: General   Post-op Pain Management:    Induction: Intravenous  PONV Risk Score and Plan: 3 and Propofol infusion and Treatment may vary due to age or medical condition  Airway Management Planned: Mask  Additional Equipment:   Intra-op Plan:   Post-operative Plan:   Informed Consent: I have reviewed the patients History and Physical, chart, labs and discussed the procedure including the risks, benefits and alternatives for the proposed anesthesia with the patient or authorized representative who has indicated his/her understanding and acceptance.     Dental advisory given  Plan Discussed with: CRNA  Anesthesia Plan Comments:         Anesthesia Quick Evaluation

## 2020-03-23 NOTE — Transfer of Care (Signed)
Immediate Anesthesia Transfer of Care Note  Patient: Leslie Bryant  Procedure(s) Performed: CARDIOVERSION (N/A )  Patient Location: Endoscopy Unit  Anesthesia Type:General  Level of Consciousness: drowsy and patient cooperative  Airway & Oxygen Therapy: Patient Spontanous Breathing  Post-op Assessment: Report given to RN and Post -op Vital signs reviewed and stable  Post vital signs: Reviewed and stable  Last Vitals:  Vitals Value Taken Time  BP    Temp    Pulse 83 03/23/20 1034  Resp 31 03/23/20 1034  SpO2 93 % 03/23/20 1034  Vitals shown include unvalidated device data.  Last Pain:  Vitals:   03/23/20 0931  PainSc: 0-No pain         Complications: No apparent anesthesia complications

## 2020-03-23 NOTE — Interval H&P Note (Signed)
History and Physical Interval Note:  03/23/2020 10:32 AM  Leslie Bryant  has presented today for surgery, with the diagnosis of afib.  The various methods of treatment have been discussed with the patient and family. After consideration of risks, benefits and other options for treatment, the patient has consented to  Procedure(s): CARDIOVERSION (N/A) as a surgical intervention.  The patient's history has been reviewed, patient examined, no change in status, stable for surgery.  I have reviewed the patient's chart and labs.  Questions were answered to the patient's satisfaction.     Donato Heinz

## 2020-03-23 NOTE — Anesthesia Postprocedure Evaluation (Signed)
Anesthesia Post Note  Patient: Leslie Bryant  Procedure(s) Performed: CARDIOVERSION (N/A )     Patient location during evaluation: Endoscopy Anesthesia Type: General Level of consciousness: awake and alert Pain management: pain level controlled Vital Signs Assessment: post-procedure vital signs reviewed and stable Respiratory status: spontaneous breathing, nonlabored ventilation and respiratory function stable Cardiovascular status: blood pressure returned to baseline and stable Postop Assessment: no apparent nausea or vomiting Anesthetic complications: no    Last Vitals:  Vitals:   03/23/20 1034 03/23/20 1047  BP: (!) 104/51 (!) 107/57  Pulse: 83 80  Resp: (!) 31 16  Temp: (!) 36.4 C   SpO2: 93% 98%    Last Pain:  Vitals:   03/23/20 1047  TempSrc:   PainSc: 0-No pain                 Arturo Sofranko,W. EDMOND

## 2020-03-23 NOTE — CV Procedure (Signed)
Procedure:   DCCV  Indication:  Symptomatic atrial fibrillation  Procedure Note:  The patient signed informed consent.  They have had had therapeutic anticoagulation with Eliquis greater than 3 weeks.  Anesthesia was administered by Dr. Ola Spurr.  Adequate airway was maintained throughout and vital followed per protocol.  They were cardioverted x 2 with 200J of biphasic synchronized energy.  Remained in AF after initial 200J shock, but converted to NSR with rate 80s after second 200J shock with pressure applied to anterior pad. There were no apparent complications.  The patient had normal neuro status and respiratory status post procedure with vitals stable as recorded elsewhere.    Follow up:  They will continue on current medical therapy and follow up with cardiology as scheduled.  Oswaldo Milian, MD 03/23/2020 10:32 AM

## 2020-04-11 ENCOUNTER — Other Ambulatory Visit: Payer: Self-pay | Admitting: Internal Medicine

## 2020-04-11 DIAGNOSIS — Z1231 Encounter for screening mammogram for malignant neoplasm of breast: Secondary | ICD-10-CM

## 2020-04-12 ENCOUNTER — Ambulatory Visit: Payer: Medicare Other | Admitting: Internal Medicine

## 2020-04-12 ENCOUNTER — Other Ambulatory Visit: Payer: Self-pay

## 2020-04-12 ENCOUNTER — Encounter: Payer: Self-pay | Admitting: *Deleted

## 2020-04-12 ENCOUNTER — Ambulatory Visit: Payer: Medicare Other | Admitting: Physician Assistant

## 2020-04-12 ENCOUNTER — Encounter: Payer: Self-pay | Admitting: Physician Assistant

## 2020-04-12 VITALS — BP 106/72 | HR 83 | Temp 95.4°F | Ht 64.0 in | Wt 164.0 lb

## 2020-04-12 DIAGNOSIS — R0600 Dyspnea, unspecified: Secondary | ICD-10-CM | POA: Diagnosis not present

## 2020-04-12 DIAGNOSIS — E785 Hyperlipidemia, unspecified: Secondary | ICD-10-CM | POA: Diagnosis not present

## 2020-04-12 DIAGNOSIS — Z79899 Other long term (current) drug therapy: Secondary | ICD-10-CM

## 2020-04-12 DIAGNOSIS — I480 Paroxysmal atrial fibrillation: Secondary | ICD-10-CM

## 2020-04-12 DIAGNOSIS — I493 Ventricular premature depolarization: Secondary | ICD-10-CM

## 2020-04-12 DIAGNOSIS — R0609 Other forms of dyspnea: Secondary | ICD-10-CM

## 2020-04-12 DIAGNOSIS — R7303 Prediabetes: Secondary | ICD-10-CM

## 2020-04-12 LAB — CBC
Hematocrit: 38.2 % (ref 34.0–46.6)
Hemoglobin: 12.3 g/dL (ref 11.1–15.9)
MCH: 29.7 pg (ref 26.6–33.0)
MCHC: 32.2 g/dL (ref 31.5–35.7)
MCV: 92 fL (ref 79–97)
Platelets: 326 10*3/uL (ref 150–450)
RBC: 4.14 x10E6/uL (ref 3.77–5.28)
RDW: 11.8 % (ref 11.7–15.4)
WBC: 8.7 10*3/uL (ref 3.4–10.8)

## 2020-04-12 NOTE — Progress Notes (Signed)
Cardiology Office Note:    Date:  04/14/2020   ID:  Bryant, Leslie [birth year], MRN [id number]  PCP:  Biagio Borg, MD  [facility] Cardiologist:  Pixie Casino, MD  [facility] Electrophysiologist:  None   Referring MD: Biagio Borg, MD   Chief Complaint  Patient presents with   Follow-up    3-4 weeks.   Headache   Arm Pain    Left arm at times.    History of Present Illness:    Leslie Bryant is a 79 y.o. female with a hx of PVCs, hyperlipidemia, prediabetes and history of atrial fibrillation.  Patient had a cardiac catheterization in 2002 that showed normal coronary arteries.  She was placed on metoprolol in the past for PVCs.  Patient was seen in the ED for shortness of breath and mild nonproductive cough.  She was found to have mild interstitial edema on chest x-ray and mildly elevated BNP.  She was placed on Lasix 40 mg daily.  Echocardiogram obtained on 02/07/2020 showed EF 60 to 57%, diastolic function was unable to be assessed, normal pulmonary artery pressure.  Subsequent heart monitor demonstrated atrial fibrillation and patient was referred to the A. fib clinic.  She was placed on Eliquis, heart rate was controlled while in atrial fibrillation.  She eventually underwent planned DC cardioversion after anticoagulated for 3 weeks.  DCCV was performed by Dr. Gardiner Rhyme on 03/24/2019.  She presents today for post cardioversion follow-up.  EKG today demonstrated that the patient has went back into atrial fibrillation.  Her heart rate is controlled, however blood pressure was borderline at 106/72.  Patient states that she does not feel any palpitation, however she had recurrence of shortness of breath and fatigue started about 4 days ago.  This likely indicated the recurrence of atrial fibrillation.  Immediately after the cardioversion, she says she felt better however symptom has recurred since.  I obtained a CBC to make sure she does not have any anemia.  I did discuss the  case with DOD Dr. Debara Pickett who is also her primary cardiologist.  We plan to proceed with Lexiscan Myoview to make sure she does not have any ischemia back could have caused the recurrence of atrial fibrillation.  If Myoview is negative, I plan to start the patient on Rythmol 150 mg 3 times daily dosing.  I will see the patient back in 4 weeks.  Once she is started on the Rythmol, we will consider a repeat cardioversion if she still maintains atrial fibrillation.  During today's visit, we also discussed flecainide, sotalol, amiodarone and Tikosyn as well.    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  Resolved by CT scan Recurrent episode clinical dx 07/2012    Diverticulosis of colon    sigmoid   GERD (gastroesophageal reflux disease)    H/O hiatal hernia    History of kidney stones    "passed it" (02/19/2017)   Hypercholesterolemia    history   Left knee pain    occasionally   Palpitations IRREGULAR HEARTBEAT--  CONTROLLED W/  BETA BLOCKER   Pneumonia 2017   "  walking pneumonia" (02/19/2017)   Pre-diabetes    pcp is monitoring a1c currently 02/10/17   Swelling of left knee joint     Past Surgical History:  Procedure Laterality Date   BREAST BIOPSY Right 1986   BREAST BIOPSY Left    BREAST RECONSTRUCTION Right 1987   W/ IMPLANTS   CARDIAC CATHETERIZATION  12-22-2000  DR BRACKBILL   NORMAL LVF/ NORMAL CORONARY ARTERIES   CARDIOVERSION N/A 03/23/2020   Procedure: CARDIOVERSION;  Surgeon: Donato Heinz, MD;  Location: [place];  Service: Cardiovascular;  Laterality: N/A;   [place]   CHONDROPLASTY  09/08/2012     Procedure: CHONDROPLASTY;  Surgeon: Tobi Bastos, MD;  Location: [place];  Service: Orthopedics;  Laterality: Left;   COLONOSCOPY     last colon 05-03-2009 (02/19/2017)   COLONOSCOPY W/ BIOPSIES AND POLYPECTOMY     KNEE ARTHROSCOPY WITH MEDIAL MENISECTOMY  09/08/2012   Procedure: KNEE ARTHROSCOPY WITH MEDIAL MENISECTOMY;  Surgeon: Tobi Bastos, MD;  Location: [place];  Service: Orthopedics;  Laterality: Left;  lateral tibial plateau,    MASTECTOMY Right 1986   W/ MULTIPLE NODE DISSECTION   REDUCTION MAMMAPLASTY Left 1987   TOTAL KNEE ARTHROPLASTY Left 07/21/2013   Procedure: TOTAL KNEE ARTHROPLASTY;  Surgeon: Newt Minion, MD;  Location: [place];  Service: Orthopedics;  Laterality: Left;  Left Total Knee Arthroplasty   TOTAL KNEE ARTHROPLASTY Right 02/19/2017   Procedure: RIGHT TOTAL KNEE ARTHROPLASTY;  Surgeon: Newt Minion, MD;  Location: [place];  Service: Orthopedics;  Laterality: Right;   VAGINAL HYSTERECTOMY  1966    Current Medications: Current Meds  Medication Sig   apixaban (ELIQUIS) 5 MG TABS tablet Take 1 tablet (5 mg total) by mouth 2 (two) times daily.   BIOTIN 5000 PO Take 5,000 mcg by mouth daily.    co-enzyme Q-10 30 MG capsule Take 30 mg by mouth 2 (two) times daily.    ezetimibe (ZETIA) 10 MG tablet Take 1 tablet by mouth once daily (Patient taking differently: Take 10 mg by mouth daily. )   famotidine (PEPCID) 40 MG tablet Take 1 tablet (40 mg total) by mouth at bedtime.   fish oil-omega-3 fatty acids 1000 MG capsule Take 1 g by mouth daily.    furosemide (LASIX) 40 MG tablet Take 1 tablet (40 mg total) by mouth daily.   metoprolol tartrate (LOPRESSOR) 50 MG tablet Take 1.5 tablets (75 mg total) by mouth 2 (two) times daily.   Multiple Vitamins-Minerals (CENTRUM SILVER PO) Take 1 tablet by mouth every morning.    omeprazole (PRILOSEC) 40 MG capsule TAKE 1 CAPSULE BY MOUTH TWICE DAILY BEFORE MEAL(S) (Patient  taking differently: Take 40 mg by mouth daily. )   triamcinolone (NASACORT) 55 MCG/ACT AERO nasal inhaler Place 2 sprays into the nose daily. (Patient taking differently: Place 2 sprays into the nose daily as needed (allergies). )   [DISCONTINUED] bumetanide (BUMEX) 1 MG tablet TAKE 1 TABLET (1 MG TOTAL) BY MOUTH DAILY AS NEEDED     Allergies:   Crestor [rosuvastatin calcium], Lipitor [atorvastatin calcium], Pravachol [pravastatin], and Penicillins   Social History   Socioeconomic History   Marital status: Married    Spouse name: Not on file   Number of children: 2   Years of education: Not on file   Highest education level: Not on file  Occupational History   Occupation: Retired  Tobacco Use   Smoking status: Never Smoker   Smokeless  tobacco: Never Used  Vaping Use   Vaping Use: Never used  Substance and Sexual Activity   Alcohol use: Yes    Alcohol/week: 1.0 standard drink    Types: 1 Glasses of wine per week    Comment: social wine once a month   Drug use: No   Sexual activity: Yes  Other Topics Concern   Not on file  Social History Narrative   Not on file   Social Determinants of Health   Financial Resource Strain:    Difficulty of Paying Living Expenses:   Food Insecurity:    Worried About Charity fundraiser in the Last Year:    Arboriculturist in the Last Year:   Transportation Needs:    Film/video editor (Medical):    Lack of Transportation (Non-Medical):   Physical Activity:    Days of Exercise per Week:    Minutes of Exercise per Session:   Stress:    Feeling of Stress :   Social Connections:    Frequency of Communication with Friends and Family:    Frequency of Social Gatherings with Friends and Family:    Attends Religious Services:    Active Member of Clubs or Organizations:    Attends Music therapist:    Marital Status:      Family History: The patient's family history includes Brain cancer in her  sister; Cancer in her brother, sister, and sister; Colon cancer in her brother; Dementia in her maternal grandfather and sister; Heart attack in her father; Heart disease in her daughter; Heart failure in her mother; Leukemia in her brother; Lung cancer in her sister.  ROS:   Please see the history of present illness.     All other systems reviewed and are negative.  EKGs/Labs/Other Studies Reviewed:    The following studies were reviewed today:  Echo 02/07/2020 1. Left ventricular ejection fraction, by estimation, is 60 to 65%. The  left ventricle has normal function. The left ventricle has no regional  wall motion abnormalities. Left ventricular diastolic function could not  be evaluated.  2. Right ventricular systolic function is normal. The right ventricular  size is normal. There is normal pulmonary artery systolic pressure.  3. The mitral valve is normal in structure. Trivial mitral valve  regurgitation. No evidence of mitral stenosis.  4. The aortic valve is grossly normal. Aortic valve regurgitation is  trivial. No aortic stenosis is present.   EKG:  EKG is ordered today.  The ekg ordered today demonstrates atrial fibrillation, heart rate 83, poor R wave progression in the anterior leads.  Recent Labs: 05/03/2019: TSH 1.46 11/09/2019: ALT 36 02/03/2020: B Natriuretic Peptide 156.1 03/16/2020: BUN 18; Creatinine, Ser 1.06; Potassium 5.7; Sodium 135 04/12/2020: Hemoglobin 12.3; Platelets 326  Recent Lipid Panel    Component Value Date/Time   CHOL 197 11/09/2019 1109   TRIG 157.0 (H) 11/09/2019 1109   HDL 49.80 11/09/2019 1109   CHOLHDL 4 11/09/2019 1109   VLDL 31.4 11/09/2019 1109   LDLCALC 116 (H) 11/09/2019 1109   LDLDIRECT 112.0 05/03/2019 1614    Physical Exam:    VS:  BP 106/72 (BP Location: Left Arm, Patient Position: Sitting, Cuff Size: Normal)    Pulse 83    Temp (!) 95.4 F (35.2 C)    Ht 5\' 4"  (1.626 m)    Wt 164 lb (74.4 kg)    BMI 28.15 kg/m     Wt Readings  from Last 3 Encounters:  04/12/20 164 lb (74.4 kg)  03/23/20 166 lb (75.3 kg)  03/16/20 166 lb (75.3 kg)     GEN:  Well nourished, well developed in no acute distress HEENT: Normal NECK: No JVD; No carotid bruits LYMPHATICS: No lymphadenopathy CARDIAC: Irregularly irregular, no murmurs, rubs, gallops RESPIRATORY:  Clear to auscultation without rales, wheezing or rhonchi  ABDOMEN: Soft, non-tender, non-distended MUSCULOSKELETAL:  No edema; No deformity  SKIN: Warm and dry NEUROLOGIC:  Alert and oriented x 3 PSYCHIATRIC:  Normal affect   ASSESSMENT:    1. Paroxysmal atrial fibrillation (HCC)   2. Medication management   3. DOE (dyspnea on exertion)   4. Hyperlipidemia LDL goal <70   5. Prediabetes   6. Asymptomatic PVCs    PLAN:    In order of problems listed above:  1. Paroxysmal atrial fibrillation: Unfortunately she does have recurrent atrial fibrillation which causes her to become more short of breath and fatigue.  Unable to further uptitrate rate control therapy.  I discussed the case with her primary cardiologist Dr. Debara Pickett, we decided to start her on antiarrhythmic therapy.  We discussed Rythmol, flecainide, amiodarone, sotalol and Tikosyn.  Given recurrence of atrial fibrillation, we recommend proceeding with Myoview, if Myoview is negative, may start on Rythmol 150 mg 3 times daily dosing before we consider cardioversion  2. Dyspnea on exertion: Related to #1  3. Hyperlipidemia: On Zetia and fish oil  4. Prediabetes: Managed by primary care provider  5. Asymptomatic PVCs: Well-controlled on metoprolol.  Unable to further uptitrate rate control therapy due to borderline blood pressure.   Medication Adjustments/Labs and Tests Ordered: Current medicines are reviewed at length with the patient today.  Concerns regarding medicines are outlined above.  Orders Placed This Encounter  Procedures   CBC   MYOCARDIAL PERFUSION IMAGING   EKG 12-Lead   No orders of the  defined types were placed in this encounter.   Patient Instructions  Medication Instructions:  Your physician recommends that you continue on your current medications as directed. Please refer to the Current Medication list given to you today.  *If you need a refill on your cardiac medications before your next appointment, please call your pharmacy*  Lab Work: Your physician recommends that you return for lab work TODAY:   CBC  If you have labs (blood work) drawn today and your tests are completely normal, you will receive your results only by:  MyChart Message (if you have MyChart) OR  A paper copy in the mail If you have any lab test that is abnormal or we need to change your treatment, we will call you to review the results.  Testing/Procedures: Your physician has requested that you have a lexiscan myoview. For further information please visit [email or web address]. Please follow instruction sheet, as given.   Please schedule for 2-3 weeks   Follow-Up: At [facility], you and your health needs are our priority.  As part of our continuing mission to provide you with exceptional heart care, we have created designated Provider Care Teams.  These Care Teams include your primary Cardiologist (physician) and Advanced Practice Providers (APPs -  Physician Assistants and Nurse Practitioners) who all work together to provide you with the care you need, when you need it.    Your next appointment:   4 week(s)  The format for your next appointment:   In Person  Provider:   Almyra Deforest, PA-C  Other Instructions      Signed, Almyra Deforest, [place]  04/14/2020 10:15 AM  [facility]

## 2020-04-12 NOTE — Patient Instructions (Addendum)
Medication Instructions:  Your physician recommends that you continue on your current medications as directed. Please refer to the Current Medication list given to you today.  *If you need a refill on your cardiac medications before your next appointment, please call your pharmacy*  Lab Work: Your physician recommends that you return for lab work TODAY:   CBC  If you have labs (blood work) drawn today and your tests are completely normal, you will receive your results only by: Marland Kitchen MyChart Message (if you have MyChart) OR . A paper copy in the mail If you have any lab test that is abnormal or we need to change your treatment, we will call you to review the results.  Testing/Procedures: Your physician has requested that you have a lexiscan myoview. For further information please visit [email or web address]. Please follow instruction sheet, as given.   Please schedule for 2-3 weeks   Follow-Up: At [facility], you and your health needs are our priority.  As part of our continuing mission to provide you with exceptional heart care, we have created designated Provider Care Teams.  These Care Teams include your primary Cardiologist (physician) and Advanced Practice Providers (APPs -  Physician Assistants and Nurse Practitioners) who all work together to provide you with the care you need, when you need it.    Your next appointment:   4 week(s)  The format for your next appointment:   In Person  Provider:   Almyra Deforest, PA-C  Other Instructions

## 2020-04-20 ENCOUNTER — Telehealth (HOSPITAL_COMMUNITY): Payer: Self-pay

## 2020-04-20 NOTE — Telephone Encounter (Signed)
Encounter complete. 

## 2020-04-25 ENCOUNTER — Other Ambulatory Visit: Payer: Self-pay

## 2020-04-25 ENCOUNTER — Ambulatory Visit (HOSPITAL_COMMUNITY)
Admission: RE | Admit: 2020-04-25 | Discharge: 2020-04-25 | Disposition: A | Discharge status: Home / Self Care | Payer: Medicare Other | Source: Ambulatory Visit | Attending: Cardiovascular Disease | Admitting: Cardiovascular Disease

## 2020-04-25 DIAGNOSIS — I480 Paroxysmal atrial fibrillation: Secondary | ICD-10-CM | POA: Insufficient documentation

## 2020-04-25 DIAGNOSIS — R0600 Dyspnea, unspecified: Secondary | ICD-10-CM

## 2020-04-25 DIAGNOSIS — R0609 Other forms of dyspnea: Secondary | ICD-10-CM

## 2020-04-25 LAB — MYOCARDIAL PERFUSION IMAGING
Peak HR: 96 {beats}/min
Rest HR: 72 {beats}/min
SDS: 1
SRS: 1
SSS: 2
TID: 1.03

## 2020-04-25 MED ORDER — TECHNETIUM TC 99M TETROFOSMIN IV KIT
31.0000 | PACK | Freq: Once | INTRAVENOUS | Status: AC | PRN
  Administered 2020-04-25: 31 via INTRAVENOUS
  Filled 2020-04-25: qty 31

## 2020-04-25 MED ORDER — REGADENOSON 0.4 MG/5ML IV SOLN
0.4000 mg | Freq: Once | INTRAVENOUS | Status: AC
Start: 2020-04-25 — End: 2020-04-25
  Administered 2020-04-25: 0.4 mg via INTRAVENOUS

## 2020-04-25 MED ORDER — TECHNETIUM TC 99M TETROFOSMIN IV KIT
10.5000 | PACK | Freq: Once | INTRAVENOUS | Status: AC | PRN
  Administered 2020-04-25: 10.5 via INTRAVENOUS
  Filled 2020-04-25: qty 11

## 2020-04-27 NOTE — Progress Notes (Signed)
I have called and spoke with the patient regarding the result, she is aware and await for the referral. Terrah, please refer to EP for management of afib.

## 2020-04-28 ENCOUNTER — Other Ambulatory Visit: Payer: Self-pay

## 2020-04-28 DIAGNOSIS — I480 Paroxysmal atrial fibrillation: Secondary | ICD-10-CM

## 2020-04-28 NOTE — Progress Notes (Signed)
Placed referral to EP

## 2020-05-03 ENCOUNTER — Encounter: Payer: Self-pay | Admitting: Internal Medicine

## 2020-05-03 ENCOUNTER — Other Ambulatory Visit: Payer: Self-pay

## 2020-05-03 ENCOUNTER — Ambulatory Visit (INDEPENDENT_AMBULATORY_CARE_PROVIDER_SITE_OTHER): Payer: Medicare Other | Admitting: Internal Medicine

## 2020-05-03 VITALS — BP 130/70 | HR 101 | Temp 97.9°F | Ht 64.0 in | Wt 160.0 lb

## 2020-05-03 DIAGNOSIS — Z0001 Encounter for general adult medical examination with abnormal findings: Secondary | ICD-10-CM

## 2020-05-03 DIAGNOSIS — E782 Mixed hyperlipidemia: Secondary | ICD-10-CM | POA: Diagnosis not present

## 2020-05-03 DIAGNOSIS — E119 Type 2 diabetes mellitus without complications: Secondary | ICD-10-CM

## 2020-05-03 DIAGNOSIS — Z1159 Encounter for screening for other viral diseases: Secondary | ICD-10-CM | POA: Diagnosis not present

## 2020-05-03 DIAGNOSIS — R1030 Lower abdominal pain, unspecified: Secondary | ICD-10-CM

## 2020-05-03 DIAGNOSIS — I10 Essential (primary) hypertension: Secondary | ICD-10-CM | POA: Diagnosis not present

## 2020-05-03 LAB — URINALYSIS, ROUTINE W REFLEX MICROSCOPIC
Bilirubin Urine: NEGATIVE
Hgb urine dipstick: NEGATIVE
Ketones, ur: NEGATIVE
Nitrite: NEGATIVE
RBC / HPF: NONE SEEN (ref 0–?)
Specific Gravity, Urine: 1.015 (ref 1.000–1.030)
Total Protein, Urine: NEGATIVE
Urine Glucose: NEGATIVE
Urobilinogen, UA: 0.2 (ref 0.0–1.0)
pH: 5.5 (ref 5.0–8.0)

## 2020-05-03 LAB — CBC WITH DIFFERENTIAL/PLATELET
Basophils Absolute: 0 10*3/uL (ref 0.0–0.1)
Basophils Relative: 0.4 % (ref 0.0–3.0)
Eosinophils Absolute: 0.2 10*3/uL (ref 0.0–0.7)
Eosinophils Relative: 2.1 % (ref 0.0–5.0)
HCT: 37.3 % (ref 36.0–46.0)
Hemoglobin: 12.7 g/dL (ref 12.0–15.0)
Lymphocytes Relative: 36.7 % (ref 12.0–46.0)
Lymphs Abs: 3 10*3/uL (ref 0.7–4.0)
MCHC: 34.1 g/dL (ref 30.0–36.0)
MCV: 92 fl (ref 78.0–100.0)
Monocytes Absolute: 0.9 10*3/uL (ref 0.1–1.0)
Monocytes Relative: 11.4 % (ref 3.0–12.0)
Neutro Abs: 4.1 10*3/uL (ref 1.4–7.7)
Neutrophils Relative %: 49.4 % (ref 43.0–77.0)
Platelets: 281 10*3/uL (ref 150.0–400.0)
RBC: 4.06 Mil/uL (ref 3.87–5.11)
RDW: 12.5 % (ref 11.5–15.5)
WBC: 8.2 10*3/uL (ref 4.0–10.5)

## 2020-05-03 LAB — BASIC METABOLIC PANEL
BUN: 24 mg/dL — ABNORMAL HIGH (ref 6–23)
CO2: 29 mEq/L (ref 19–32)
Calcium: 9.5 mg/dL (ref 8.4–10.5)
Chloride: 95 mEq/L — ABNORMAL LOW (ref 96–112)
Creatinine, Ser: 1.12 mg/dL (ref 0.40–1.20)
GFR: 46.83 mL/min — ABNORMAL LOW (ref >60.00)
Glucose, Bld: 135 mg/dL — ABNORMAL HIGH (ref 70–99)
Potassium: 3.9 mEq/L (ref 3.5–5.1)
Sodium: 133 mEq/L — ABNORMAL LOW (ref 135–145)

## 2020-05-03 LAB — HEPATIC FUNCTION PANEL
ALT: 32 U/L (ref 0–35)
AST: 28 U/L (ref 0–37)
Albumin: 4.4 g/dL (ref 3.5–5.2)
Alkaline Phosphatase: 58 U/L (ref 39–117)
Bilirubin, Direct: 0.1 mg/dL (ref 0.0–0.3)
Total Bilirubin: 0.3 mg/dL (ref 0.2–1.2)
Total Protein: 7.4 g/dL (ref 6.0–8.3)

## 2020-05-03 LAB — LIPASE: Lipase: 48 U/L (ref 11.0–59.0)

## 2020-05-03 LAB — LIPID PANEL
Cholesterol: 163 mg/dL (ref 0–200)
HDL: 43.7 mg/dL (ref >39.00)
NonHDL: 119.45
Total CHOL/HDL Ratio: 4
Triglycerides: 228 mg/dL — ABNORMAL HIGH (ref 0.0–149.0)
VLDL: 45.6 mg/dL — ABNORMAL HIGH (ref 0.0–40.0)

## 2020-05-03 LAB — MICROALBUMIN / CREATININE URINE RATIO
Creatinine,U: 74.9 mg/dL
Microalb Creat Ratio: 0.9 mg/g (ref 0.0–30.0)
Microalb, Ur: <0.7 mg/dL (ref 0.0–1.9)

## 2020-05-03 LAB — LDL CHOLESTEROL, DIRECT: Direct LDL: 104 mg/dL

## 2020-05-03 LAB — HEMOGLOBIN A1C: Hgb A1c MFr Bld: 6.8 % — ABNORMAL HIGH (ref 4.6–6.5)

## 2020-05-03 LAB — TSH: TSH: 1.3 u[IU]/mL (ref 0.35–4.50)

## 2020-05-03 NOTE — Addendum Note (Signed)
Addended by: Trenda Moots on: 3/54/6568 02:17 PM   Modules accepted: Orders

## 2020-05-03 NOTE — Assessment & Plan Note (Signed)
Etiology unclear, exam benign, for labs as above

## 2020-05-03 NOTE — Assessment & Plan Note (Signed)
stable overall by history and exam, recent data reviewed with pt, and pt to continue medical treatment as before,  to f/u any worsening symptoms or concerns  

## 2020-05-03 NOTE — Progress Notes (Signed)
Subjective:    Patient ID: Leslie Bryant, female    DOB: [birth year], 79 y.o.   MRN: [id number]  HPI  Here for wellness and f/u;  Overall doing ok;  Pt denies Chest pain, worsening SOB, DOE, wheezing, orthopnea, PND, worsening LE edema, palpitations, dizziness or syncope.  Pt denies neurological change such as new headache, facial or extremity weakness.  Pt denies polydipsia, polyuria, or low sugar symptoms. Pt states overall good compliance with treatment and medications, good tolerability, and has been trying to follow appropriate diet.  Pt denies worsening depressive symptoms, suicidal ideation or panic. No fever, night sweats, wt loss, loss of appetite, or other constitutional symptoms.  Pt states good ability with ADL's, has low fall risk, home safety reviewed and adequate, no other significant changes in hearing or vision, and only occasionally active with exercise.   Did have episode of lower abd paiin with vomit x 3 yesterday for unclear reasons, though Denies worsening reflux,dysphagia, nausea, bowel change or blood.  Also Denies urinary symptoms such as dysuria, frequency, urgency, flank pain, hematuria or n/v, fever, chills.' Past Medical History:  Diagnosis Date  . Acute meniscal tear of knee LEFT KNEE  . Allergic rhinitis   . Arthritis    "knees" (02/19/2017)  . Atrial fibrillation ([place])   . Breast cancer, right breast ([place]) 1986  . 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 Diverticulitis of large intestine with perforation 11/02/2011   Microperforation probably related to nut and popcorn consumption.  Resolved by CT scan Recurrent episode clinical dx 07/2012   . Diverticulosis of colon    sigmoid  . GERD (gastroesophageal reflux disease)   . H/O hiatal hernia   . History of kidney stones    "passed it" (02/19/2017)  . Hypercholesterolemia    history  . Left knee pain    occasionally  . Palpitations IRREGULAR HEARTBEAT--  CONTROLLED W/  BETA BLOCKER  . Pneumonia 2017   "walking  pneumonia" (02/19/2017)  . Pre-diabetes    pcp is monitoring a1c currently 02/10/17  . Swelling of left knee joint    Past Surgical History:  Procedure Laterality Date  . BREAST BIOPSY Right 1986  . BREAST BIOPSY Left   . BREAST RECONSTRUCTION Right 1987   W/ IMPLANTS  . CARDIAC CATHETERIZATION  12-22-2000  DR BRACKBILL   NORMAL LVF/ NORMAL CORONARY ARTERIES  . CARDIOVERSION N/A 03/23/2020   Procedure: CARDIOVERSION;  Surgeon: Donato Heinz, MD;  Location: [place];  Service: Cardiovascular;  Laterality: N/A;  . CHOLECYSTECTOMY OPEN  1988  . CHONDROPLASTY  09/08/2012   Procedure: CHONDROPLASTY;  Surgeon: Tobi Bastos, MD;  Location: [facility];  Service: Orthopedics;  Laterality: Left;  . COLONOSCOPY     last colon 05-03-2009 (02/19/2017)  . COLONOSCOPY W/ BIOPSIES AND POLYPECTOMY    . KNEE ARTHROSCOPY WITH MEDIAL MENISECTOMY  09/08/2012   Procedure: KNEE ARTHROSCOPY WITH MEDIAL MENISECTOMY;  Surgeon: Tobi Bastos, MD;  Location: [place];  Service: Orthopedics;  Laterality: Left;  lateral tibial plateau,   . MASTECTOMY Right 1986   W/ MULTIPLE NODE DISSECTION  . REDUCTION MAMMAPLASTY Left 1987  . TOTAL KNEE ARTHROPLASTY Left 07/21/2013   Procedure: TOTAL KNEE ARTHROPLASTY;  Surgeon: Newt Minion, MD;  Location: [place];  Service: Orthopedics;  Laterality: Left;  Left Total Knee Arthroplasty  . TOTAL KNEE ARTHROPLASTY Right 02/19/2017   Procedure: RIGHT TOTAL KNEE ARTHROPLASTY;  Surgeon: Newt Minion, MD;  Location: [place];  Service: Orthopedics;  Laterality: Right;  Marland Kitchen VAGINAL HYSTERECTOMY  1966    reports that she has never smoked. She has never used smokeless tobacco. She reports current alcohol use of about 1.0 standard drink of alcohol per week. She reports that she does not use drugs. family history includes Brain cancer in her sister; Cancer in her brother, sister, and sister; Colon cancer in her brother; Dementia in her maternal  grandfather and sister; Heart attack in her father; Heart disease in her daughter; Heart failure in her mother; Leukemia in her brother; Lung cancer in her sister. Allergies  Allergen Reactions  . Crestor [Rosuvastatin Calcium] Other (See Comments)    Shoulder pain  . Lipitor [Atorvastatin Calcium] Other (See Comments)    Myalgias   . Pravachol [Pravastatin] Other (See Comments)    myalgias  . Penicillins Itching    Has patient had a PCN reaction causing immediate rash, facial/tongue/throat swelling, SOB or lightheadedness with hypotension: No Has patient had a PCN reaction causing severe rash involving mucus membranes or skin necrosis: No Has patient had a PCN reaction that required hospitalization No Has patient had a PCN reaction occurring within the last 10 years: No If all of the above answers are "NO", then may proceed with Cephalosporin use.    Current Outpatient Medications on File Prior to Visit  Medication Sig Dispense Refill  . apixaban (ELIQUIS) 5 MG TABS tablet Take 1 tablet (5 mg total) by mouth 2 (two) times daily. 60 tablet 11  . BIOTIN 5000 PO Take 5,000 mcg by mouth daily.     Marland Kitchen co-enzyme Q-10 30 MG capsule Take 30 mg by mouth 2 (two) times daily.     Marland Kitchen ezetimibe (ZETIA) 10 MG tablet Take 1 tablet by mouth once daily (Patient taking differently: Take 10 mg by mouth daily. ) 90 tablet 3  . famotidine (PEPCID) 40 MG tablet Take 1 tablet (40 mg total) by mouth at bedtime. 90 tablet 3  . fish oil-omega-3 fatty acids 1000 MG capsule Take 1 g by mouth daily.     . furosemide (LASIX) 40 MG tablet Take 1 tablet (40 mg total) by mouth daily. 90 tablet 3  . metoprolol tartrate (LOPRESSOR) 50 MG tablet Take 1.5 tablets (75 mg total) by mouth 2 (two) times daily. 180 tablet 1  . Multiple Vitamins-Minerals (CENTRUM SILVER PO) Take 1 tablet by mouth every morning.     Marland Kitchen omeprazole (PRILOSEC) 40 MG capsule TAKE 1 CAPSULE BY MOUTH TWICE DAILY BEFORE MEAL(S) (Patient taking differently:  Take 40 mg by mouth daily. ) 180 capsule 0  . triamcinolone (NASACORT) 55 MCG/ACT AERO nasal inhaler Place 2 sprays into the nose daily. (Patient taking differently: Place 2 sprays into the nose daily as needed (allergies). ) 1 Inhaler 12  . [DISCONTINUED] bumetanide (BUMEX) 1 MG tablet TAKE 1 TABLET (1 MG TOTAL) BY MOUTH DAILY AS NEEDED 90 tablet 1   No current facility-administered medications on file prior to visit.   Review of Systems All otherwise neg per pt     Objective:   Physical Exam  BP 130/70 (BP Location: Left Arm, Patient Position: Sitting, Cuff Size: Large)   Pulse (!) 101   Temp 97.9 F (36.6 C) (Oral)   Ht 5\' 4"  (1.626 m)   Wt 160 lb (72.6 kg)   SpO2 95%   BMI 27.46 kg/m  VS noted,  Constitutional: Pt appears in NAD HENT: Head: NCAT.  Right Ear: External ear normal.  Left Ear: External ear normal.  Eyes: . Pupils are equal, round, and reactive to light. Conjunctivae and EOM are normal Nose: without d/c or deformity Neck: Neck supple. Gross normal ROM Cardiovascular: Normal rate and regular rhythm.   Pulmonary/Chest: Effort normal and breath sounds without rales or wheezing.  Abd:  Soft, NT, ND, + BS, no organomegaly Neurological: Pt is alert. At baseline orientation, motor grossly intact Skin: Skin is warm. No rashes, other new lesions, no LE edema Psychiatric: Pt behavior is normal without agitation  All otherwise neg per pt VS noted,  Constitutional: Pt appears in NAD HENT: Head: NCAT.  Right Ear: External ear normal.  Left Ear: External ear normal.  Eyes: . Pupils are equal, round, and reactive to light. Conjunctivae and EOM are normal Nose: without d/c or deformity Neck: Neck supple. Gross normal ROM Cardiovascular: Normal rate and regular rhythm.   Pulmonary/Chest: Effort normal and breath sounds without rales or wheezing.  Abd:  Soft, NT, ND, + BS, no organomegaly Neurological: Pt is alert. At baseline orientation, motor grossly intact Skin: Skin  is warm. No rashes, other new lesions, no LE edema Psychiatric: Pt behavior is normal without agitation  All otherwise neg per pt Lab Results  Component Value Date   WBC 8.7 04/12/2020   HGB 12.3 04/12/2020   HCT 38.2 04/12/2020   PLT 326 04/12/2020   GLUCOSE 124 (H) 03/16/2020   CHOL 197 11/09/2019   TRIG 157.0 (H) 11/09/2019   HDL 49.80 11/09/2019   LDLDIRECT 112.0 05/03/2019   LDLCALC 116 (H) 11/09/2019   ALT 36 (H) 11/09/2019   AST 27 11/09/2019   NA 135 03/16/2020   K 5.7 (H) 03/16/2020   CL 96 03/16/2020   CREATININE 1.06 (H) 03/16/2020   BUN 18 03/16/2020   CO2 22 03/16/2020   TSH 1.46 05/03/2019   INR 1.01 07/13/2013   HGBA1C 6.7 (H) 11/09/2019   MICROALBUR <0.7 05/03/2019       Assessment & Plan:

## 2020-05-03 NOTE — Addendum Note (Signed)
Addended by: Trenda Moots on: 4/96/1164 02:18 PM   Modules accepted: Orders

## 2020-05-03 NOTE — Assessment & Plan Note (Signed)

## 2020-05-03 NOTE — Patient Instructions (Signed)
Please continue all other medications as before, and refills have been done if requested.  Please have the pharmacy call with any other refills you may need.  Please continue your efforts at being more active, low cholesterol diet, and weight control.  You are otherwise up to date with prevention measures today.  Please keep your appointments with your specialists as you may have planned  You will be contacted regarding the referral for: colonoscopy  Please go to the LAB at the blood drawing area for the tests to be done  You will be contacted by phone if any changes need to be made immediately.  Otherwise, you will receive a letter about your results with an explanation, but please check with MyChart first.  Please remember to sign up for MyChart if you have not done so, as this will be important to you in the future with finding out test results, communicating by private email, and scheduling acute appointments online when needed.  Please make an Appointment to return in 6 months, or sooner if needed 

## 2020-05-04 LAB — HEPATITIS C ANTIBODY
Hepatitis C Ab: NONREACTIVE
SIGNAL TO CUT-OFF: 0.02 (ref <1.00)

## 2020-05-05 ENCOUNTER — Other Ambulatory Visit: Payer: Self-pay

## 2020-05-05 ENCOUNTER — Ambulatory Visit
Admission: RE | Admit: 2020-05-05 | Discharge: 2020-05-05 | Disposition: A | Discharge status: Home / Self Care | Payer: Medicare Other | Source: Ambulatory Visit | Attending: Internal Medicine | Admitting: Internal Medicine

## 2020-05-05 ENCOUNTER — Other Ambulatory Visit: Payer: Self-pay | Admitting: Internal Medicine

## 2020-05-05 DIAGNOSIS — Z1231 Encounter for screening mammogram for malignant neoplasm of breast: Secondary | ICD-10-CM

## 2020-05-10 ENCOUNTER — Telehealth: Payer: Self-pay | Admitting: Internal Medicine

## 2020-05-10 ENCOUNTER — Telehealth: Payer: Self-pay

## 2020-05-10 NOTE — Telephone Encounter (Signed)
Patient called states she has been having a lot of GI issues and she needs to know how to proceed with getting relief. Constantly in the bathroom either pooping or vomiting. She also stated needing to have a colonoscopy but is on Eliquis but she does not think she will get clearance from her doctor. Please advise

## 2020-05-10 NOTE — Telephone Encounter (Signed)
New message    Calling for test results  & discuss going back on metformin

## 2020-05-10 NOTE — Telephone Encounter (Signed)
The pt was last seen in Feb of this year and was scheduled for colon.  She cancelled and wanted to try diet changes first.  She continues to have the same GI complaints but was put on eliquis in April.  An appt has been made for 7/22 with Dr Carlean Purl to discuss.  The pt has been advised of the information and verbalized understanding.

## 2020-05-11 ENCOUNTER — Telehealth: Payer: Medicare Other | Admitting: Internal Medicine

## 2020-05-11 NOTE — Telephone Encounter (Signed)
She could, but it is not absolutely necessary as her a1c was only 6.8 at last visit

## 2020-05-11 NOTE — Telephone Encounter (Signed)
Sent to Dr. Jenny Reichmann to advise about pt going back on Metformin.

## 2020-05-12 NOTE — Telephone Encounter (Signed)
Spoke with pt and informed her of Dr. Gwynn Burly note. Pt understood and has no questions or concerns at this time.

## 2020-05-16 ENCOUNTER — Ambulatory Visit
Admission: RE | Admit: 2020-05-16 | Discharge: 2020-05-16 | Disposition: A | Discharge status: Home / Self Care | Payer: Medicare Other | Source: Ambulatory Visit | Attending: Internal Medicine | Admitting: Internal Medicine

## 2020-05-16 ENCOUNTER — Ambulatory Visit: Payer: Medicare Other | Admitting: Physician Assistant

## 2020-05-16 ENCOUNTER — Other Ambulatory Visit: Payer: Self-pay

## 2020-05-16 ENCOUNTER — Encounter: Payer: Self-pay | Admitting: Physician Assistant

## 2020-05-16 VITALS — BP 116/76 | HR 88 | Temp 97.0°F | Ht 62.0 in | Wt 160.8 lb

## 2020-05-16 DIAGNOSIS — R7303 Prediabetes: Secondary | ICD-10-CM

## 2020-05-16 DIAGNOSIS — E782 Mixed hyperlipidemia: Secondary | ICD-10-CM

## 2020-05-16 DIAGNOSIS — Z1231 Encounter for screening mammogram for malignant neoplasm of breast: Secondary | ICD-10-CM | POA: Diagnosis not present

## 2020-05-16 DIAGNOSIS — I4819 Other persistent atrial fibrillation: Secondary | ICD-10-CM

## 2020-05-16 NOTE — Progress Notes (Signed)
Cardiology Office Note:    Date:  05/18/2020   ID:  Gesenia, Bantz [birth year], MRN [id number]  PCP:  Biagio Borg, MD  [facility] Cardiologist:  Pixie Casino, MD  [facility] Electrophysiologist:  None   Referring MD: Biagio Borg, MD   Chief Complaint  Patient presents with  . Follow-up    History of Present Illness:    Leslie Bryant is a 79 y.o. female with a hx of PVCs, hyperlipidemia, prediabetes and history of atrial fibrillation.  Patient had a cardiac catheterization in 2002 that showed normal coronary arteries.  She was placed on metoprolol in the past for PVCs.  Patient was seen in the ED for shortness of breath and mild nonproductive cough.  She was found to have mild interstitial edema on chest x-ray and mildly elevated BNP.  She was placed on Lasix 40 mg daily.  Echocardiogram obtained on 02/07/2020 showed EF 60 to 15%, diastolic function was unable to be assessed, normal pulmonary artery pressure.  Subsequent heart monitor demonstrated atrial fibrillation and patient was referred to the A. fib clinic.  She was placed on Eliquis, heart rate was controlled while in atrial fibrillation.  She eventually underwent planned DC cardioversion after anticoagulated for 3 weeks.  DCCV was performed by Dr. Gardiner Rhyme on 03/24/2019.  Unfortunately she went back into atrial fibrillation by the time she was followed up in June 2021.  Heart rate was controlled and the blood pressure was borderline.  I discussed with Dr. Debara Pickett at the time and we recommend proceeding with Lexiscan Myoview to make sure she does not have any ischemia before starting her on either Rythmol or flecainide.  Once she is started on antiarrhythmic therapy, then we will consider a repeat cardioversion.  Myoview performed on 04/25/2020 showed a small defect of mild severity present in the apex location which could be previous infarct versus artifact, overall low risk study, this study was not gated due to atrial  fibrillation.  Since there was a possibility of previous infarct Myoview, I did discuss with Dr. Debara Pickett because there is a contraindication with class Ic antiarrhythmic therapy, Dr. Debara Pickett recommended referral to EP service.  Talking with the patient, in the past few weeks, she has been feeling quite excellent and has not had any issue with doing every day activity.  She has a follow-up with Dr. Caryl Comes in August.  At this point, she is aware that she may either stay in atrial fibrillation for the rest of her life or try antiarrhythmic therapy and to the cardioversion again.  I think both options are quite reasonable.  Otherwise she has no lower extremity edema, orthopnea, PND or any kind of chest discomfort.  Heart rate very well controlled at rest.  She may begin to increase exercise level.  She can follow-up with Dr. Debara Pickett in 4 months.   Past Medical History:  Diagnosis Date  . Acute meniscal tear of knee LEFT KNEE  . Allergic rhinitis   . Arthritis    "knees" (02/19/2017)  . Atrial fibrillation ([employer])   . Breast cancer, right breast ([place]) 1986  . 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 Diverticulitis of large intestine with perforation 11/02/2011   Microperforation probably related to nut and popcorn consumption.  Resolved by CT scan Recurrent episode clinical dx 07/2012   . Diverticulosis of colon    sigmoid  . GERD (gastroesophageal reflux disease)   . H/O hiatal hernia   .  History of kidney stones    "passed it" (02/19/2017)  . Hypercholesterolemia    history  . Left knee pain    occasionally  . Palpitations IRREGULAR HEARTBEAT--  CONTROLLED W/  BETA BLOCKER  . Pneumonia 2017   "walking pneumonia" (02/19/2017)  . Pre-diabetes    pcp is monitoring a1c currently 02/10/17  . Swelling of left knee joint     Past Surgical History:  Procedure Laterality Date  . BREAST BIOPSY Right 1986  . BREAST BIOPSY Left   . BREAST RECONSTRUCTION Right 1987   W/ IMPLANTS  . CARDIAC CATHETERIZATION   12-22-2000  DR BRACKBILL   NORMAL LVF/ NORMAL CORONARY ARTERIES  . CARDIOVERSION N/A 03/23/2020   Procedure: CARDIOVERSION;  Surgeon: Donato Heinz, MD;  Location: [place];  Service: Cardiovascular;  Laterality: N/A;  . CHOLECYSTECTOMY OPEN  1988  . CHONDROPLASTY  09/08/2012   Procedure: CHONDROPLASTY;  Surgeon: Tobi Bastos, MD;  Location: [facility];  Service: Orthopedics;  Laterality: Left;  . COLONOSCOPY     last colon 05-03-2009 (02/19/2017)  . COLONOSCOPY W/ BIOPSIES AND POLYPECTOMY    . KNEE ARTHROSCOPY WITH MEDIAL MENISECTOMY  09/08/2012   Procedure: KNEE ARTHROSCOPY WITH MEDIAL MENISECTOMY;  Surgeon: Tobi Bastos, MD;  Location: [place];  Service: Orthopedics;  Laterality: Left;  lateral tibial plateau,   . MASTECTOMY Right 1986   W/ MULTIPLE NODE DISSECTION  . REDUCTION MAMMAPLASTY Left 1987  . TOTAL KNEE ARTHROPLASTY Left 07/21/2013   Procedure: TOTAL KNEE ARTHROPLASTY;  Surgeon: Newt Minion, MD;  Location: [place];  Service: Orthopedics;  Laterality: Left;  Left Total Knee Arthroplasty  . TOTAL KNEE ARTHROPLASTY Right 02/19/2017   Procedure: RIGHT TOTAL KNEE ARTHROPLASTY;  Surgeon: Newt Minion, MD;  Location: [place];  Service: Orthopedics;  Laterality: Right;  Marland Kitchen VAGINAL HYSTERECTOMY  1966    Current Medications: Current Meds  Medication Sig  . apixaban (ELIQUIS) 5 MG TABS tablet Take 1 tablet (5 mg total) by mouth 2 (two) times daily.  Marland Kitchen BIOTIN 5000 PO Take 5,000 mcg by mouth daily.   Marland Kitchen co-enzyme Q-10 30 MG capsule Take 30 mg by mouth 2 (two) times daily.   Marland Kitchen ezetimibe (ZETIA) 10 MG tablet Take 1 tablet by mouth once daily (Patient taking differently: Take 10 mg by mouth daily. )  . famotidine (PEPCID) 40 MG tablet Take 1 tablet (40 mg total) by mouth at bedtime.  . fish oil-omega-3 fatty acids 1000 MG capsule Take 1 g by mouth daily.   . furosemide (LASIX) 40 MG tablet Take 1 tablet (40 mg total) by mouth daily.  .  metoprolol tartrate (LOPRESSOR) 50 MG tablet Take 1.5 tablets (75 mg total) by mouth 2 (two) times daily.  . Multiple Vitamins-Minerals (CENTRUM SILVER PO) Take 1 tablet by mouth every morning.   Marland Kitchen omeprazole (PRILOSEC) 40 MG capsule TAKE 1 CAPSULE BY MOUTH TWICE DAILY BEFORE MEAL(S) (Patient taking differently: Take 40 mg by mouth daily. )     Allergies:   Crestor [rosuvastatin calcium], Lipitor [atorvastatin calcium], Pravachol [pravastatin], and Penicillins   Social History   Socioeconomic History  . Marital status: Married    Spouse name: Not on file  . Number of children: 2  . Years of education: Not on file  . Highest education level: Not on file  Occupational History  . Occupation: Retired  Tobacco Use  . Smoking status: Never Smoker  . Smokeless tobacco: Never Used  Vaping Use  .  Vaping Use: Never used  Substance and Sexual Activity  . Alcohol use: Yes    Alcohol/week: 1.0 standard drink    Types: 1 Glasses of wine per week    Comment: social wine once a month  . Drug use: No  . Sexual activity: Yes  Other Topics Concern  . Not on file  Social History Narrative  . Not on file   Social Determinants of Health   Financial Resource Strain:   . Difficulty of Paying Living Expenses:   Food Insecurity:   . Worried About Charity fundraiser in the Last Year:   . Arboriculturist in the Last Year:   Transportation Needs:   . Film/video editor (Medical):   Marland Kitchen Lack of Transportation (Non-Medical):   Physical Activity:   . Days of Exercise per Week:   . Minutes of Exercise per Session:   Stress:   . Feeling of Stress :   Social Connections:   . Frequency of Communication with Friends and Family:   . Frequency of Social Gatherings with Friends and Family:   . Attends Religious Services:   . Active Member of Clubs or Organizations:   . Attends Archivist Meetings:   Marland Kitchen Marital Status:      Family History: The patient's family history includes Brain  cancer in her sister; Cancer in her brother, sister, and sister; Colon cancer in her brother; Dementia in her maternal grandfather and sister; Heart attack in her father; Heart disease in her daughter; Heart failure in her mother; Leukemia in her brother; Lung cancer in her sister.  ROS:   Please see the history of present illness.     All other systems reviewed and are negative.  EKGs/Labs/Other Studies Reviewed:    The following studies were reviewed today:  Myoview 04/25/2020  There was no ST segment deviation noted during stress.  Study was not gated due to atrial fibrillation  Defect 1: There is a small defect of mild severity present in the apex location.  Findings consistent with prior myocardial infarction.  This is a low risk study.   Small fixed perfusion defect at apex, which could represent prior infarct vs artifact.  Unable to assess wall motion at apex, as study was not gated due to atrial fibrillation   EKG:  EKG is ordered today.  The ekg ordered today demonstrates atrial fibrillation, heart rate 88  Recent Labs: 02/03/2020: B Natriuretic Peptide 156.1 05/03/2020: ALT 32; BUN 24; Creatinine, Ser 1.12; Hemoglobin 12.7; Platelets 281.0; Potassium 3.9; Sodium 133; TSH 1.30  Recent Lipid Panel    Component Value Date/Time   CHOL 163 05/03/2020 1417   TRIG 228.0 (H) 05/03/2020 1417   HDL 43.70 05/03/2020 1417   CHOLHDL 4 05/03/2020 1417   VLDL 45.6 (H) 05/03/2020 1417   LDLCALC 116 (H) 11/09/2019 1109   LDLDIRECT 104.0 05/03/2020 1417    Physical Exam:    VS:  BP 116/76   Pulse 88   Temp (!) 97 F (36.1 C)   Ht 5\' 2"  (1.575 m)   Wt 160 lb 12.8 oz (72.9 kg)   SpO2 96%   BMI 29.41 kg/m     Wt Readings from Last 3 Encounters:  05/16/20 160 lb 12.8 oz (72.9 kg)  05/03/20 160 lb (72.6 kg)  04/25/20 164 lb (74.4 kg)     GEN:  Well nourished, well developed in no acute distress HEENT: Normal NECK: No JVD; No carotid bruits LYMPHATICS: No  lymphadenopathy CARDIAC: Irregularly irregular, no murmurs, rubs, gallops RESPIRATORY:  Clear to auscultation without rales, wheezing or rhonchi  ABDOMEN: Soft, non-tender, non-distended MUSCULOSKELETAL:  No edema; No deformity  SKIN: Warm and dry NEUROLOGIC:  Alert and oriented x 3 PSYCHIATRIC:  Normal affect   ASSESSMENT:    1. Persistent atrial fibrillation ([place])   2. Mixed hyperlipidemia   3. Prediabetes    PLAN:    In order of problems listed above:  1. Persistent atrial fibrillation: Patient was referred to EP service at the recommendation of Dr. Debara Pickett.  We initially wanted to put her on Rythmol or flecainide, however recent Myoview showed a artifact versus scar.  Dr. Debara Pickett recommended she see EP service to discuss additional antiarrhythmic option.  She had cardioversion earlier this year, however did not maintain sinus rhythm for long.  She is compliant with Eliquis  2. Hyperlipidemia: Continue Zetia  3. Prediabetes: Managed by primary care provider.   Medication Adjustments/Labs and Tests Ordered: Current medicines are reviewed at length with the patient today.  Concerns regarding medicines are outlined above.  Orders Placed This Encounter  Procedures  . EKG 12-Lead   No orders of the defined types were placed in this encounter.   Patient Instructions  Medication Instructions:  Your physician recommends that you continue on your current medications as directed. Please refer to the Current Medication list given to you today.  *If you need a refill on your cardiac medications before your next appointment, please call your pharmacy*  Lab Work: NONE ordered at this time of appointment   If you have labs (blood work) drawn today and your tests are completely normal, you will receive your results only by: Marland Kitchen MyChart Message (if you have MyChart) OR . A paper copy in the mail If you have any lab test that is abnormal or we need to change your treatment, we will call you  to review the results.  Testing/Procedures: NONE ordered at this time of appointment   Follow-Up: At [facility], you and your health needs are our priority.  As part of our continuing mission to provide you with exceptional heart care, we have created designated Provider Care Teams.  These Care Teams include your primary Cardiologist (physician) and Advanced Practice Providers (APPs -  Physician Assistants and Nurse Practitioners) who all work together to provide you with the care you need, when you need it.  Your next appointment:   4 month(s)  The format for your next appointment:   In Person  Provider:   Raliegh Ip Mali Hilty, MD  Other Instructions      Signed, Almyra Deforest, [place]  05/18/2020 11:05 PM    [place]

## 2020-05-16 NOTE — Patient Instructions (Signed)
Medication Instructions:  Your physician recommends that you continue on your current medications as directed. Please refer to the Current Medication list given to you today.  *If you need a refill on your cardiac medications before your next appointment, please call your pharmacy*  Lab Work: NONE ordered at this time of appointment   If you have labs (blood work) drawn today and your tests are completely normal, you will receive your results only by: Marland Kitchen MyChart Message (if you have MyChart) OR . A paper copy in the mail If you have any lab test that is abnormal or we need to change your treatment, we will call you to review the results.  Testing/Procedures: NONE ordered at this time of appointment   Follow-Up: At [facility], you and your health needs are our priority.  As part of our continuing mission to provide you with exceptional heart care, we have created designated Provider Care Teams.  These Care Teams include your primary Cardiologist (physician) and Advanced Practice Providers (APPs -  Physician Assistants and Nurse Practitioners) who all work together to provide you with the care you need, when you need it.  Your next appointment:   4 month(s)  The format for your next appointment:   In Person  Provider:   K. Mali Hilty, MD  Other Instructions

## 2020-05-25 ENCOUNTER — Encounter: Payer: Self-pay | Admitting: Internal Medicine

## 2020-05-25 ENCOUNTER — Ambulatory Visit: Payer: Medicare Other | Admitting: Internal Medicine

## 2020-05-25 ENCOUNTER — Telehealth: Payer: Self-pay

## 2020-05-25 VITALS — BP 112/74 | HR 57 | Ht 62.0 in | Wt 161.0 lb

## 2020-05-25 DIAGNOSIS — R194 Change in bowel habit: Secondary | ICD-10-CM

## 2020-05-25 DIAGNOSIS — R1032 Left lower quadrant pain: Secondary | ICD-10-CM | POA: Diagnosis not present

## 2020-05-25 DIAGNOSIS — R112 Nausea with vomiting, unspecified: Secondary | ICD-10-CM

## 2020-05-25 DIAGNOSIS — Z7901 Long term (current) use of anticoagulants: Secondary | ICD-10-CM

## 2020-05-25 MED ORDER — HYOSCYAMINE SULFATE SL 0.125 MG SL SUBL: 1.0000 | SUBLINGUAL_TABLET | Freq: Three times a day (TID) | SUBLINGUAL | 1 refills | Status: DC | PRN

## 2020-05-25 NOTE — Progress Notes (Signed)
[name] 79 y.o. 1941/01/26 [id number]  Assessment & Plan:   Encounter Diagnoses  Name Primary?  . Change in bowel habit Yes  . LLQ pain   . Nausea and vomiting, intractability of vomiting not specified, unspecified vomiting type   . Long term current use of anticoagulant     Trial of hyoscyamine SL as needed when she gets these pains that might induce nausea and vomiting.  Explained that we try to limit this use in elderly people but I think it is a reasonable thing to try given her circumstances.  Explained side effects of lightheadedness etc.  Go ahead with colonoscopy and hold Eliquis 2 days before.  Clarify with cardiology.  The routine risks of colonoscopy were explained to the patient in detail and she has reviewed the informed consent form and she understands is a rare but real risk of stroke when off her Eliquis.  Further plans pending this evaluation and the response to hyoscyamine, if any.  CC: Biagio Borg, MD     Subjective:   Chief Complaint: Change in bowel habits nausea and vomiting  HPI Leslie Bryant is here for follow-up, she had seen Ellouise Newer in February with complaints of crampy abdominal pain and difficulty with defecation with loose stools terrible pain and cramps inducing nausea and vomiting with some episodes of defecation.  The pain was in the left lower quadrant.  Slight rectal bleeding at times.  She had a CT scan in January before that that showed diverticulosis but no other significant pathology referable to the GI tract.  She was going to have a colonoscopy to evaluate this, appropriately but then she started having some issues with A. fib so she canceled.  She is now on Eliquis for A. fib.  She had a functional stress test that shows a perfusion defect probably old MI, versus artifact.  She is not on antiarrhythmics and has been referred to the electrophysiologist, Dr. Caryl Comes through Dr. Debara Pickett because of potential contraindication due to his fixed  defect and to determine appropriate medical therapy of her A. fib.  I do not think the A. fib gives her much trouble.  She has continued to have these bowel movement issues off and on she is afraid to leave the house at times and would like some relief.  She has not tried any symptomatic therapy at this point.  The symptoms are relatively stable and occurring intermittently throughout the month.  She is ready to do a colonoscopy.  Note that she cannot identify any dietary triggers, she drinks almond milk because she likes the taste better she does not really have dairy products.  There are no significant artificial sweeteners.  She does also describe some intermittent discomfort almost like a hunger pain she says in the epigastrium that comes and goes.  She is not really hungry she just has had sensation at times.  Wt Readings from Last 3 Encounters:  05/25/20 161 lb (73 kg)  05/16/20 160 lb 12.8 oz (72.9 kg)  05/03/20 160 lb (72.6 kg)     Allergies  Allergen Reactions  . Crestor [Rosuvastatin Calcium] Other (See Comments)    Shoulder pain  . Lipitor [Atorvastatin Calcium] Other (See Comments)    Myalgias   . Pravachol [Pravastatin] Other (See Comments)    myalgias  . Penicillins Itching    Has patient had a PCN reaction causing immediate rash, facial/tongue/throat swelling, SOB or lightheadedness with hypotension: No Has patient had a PCN reaction causing  severe rash involving mucus membranes or skin necrosis: No Has patient had a PCN reaction that required hospitalization No Has patient had a PCN reaction occurring within the last 10 years: No If all of the above answers are "NO", then may proceed with Cephalosporin use.    Current Meds  Medication Sig  . apixaban (ELIQUIS) 5 MG TABS tablet Take 1 tablet (5 mg total) by mouth 2 (two) times daily.  Marland Kitchen BIOTIN 5000 PO Take 5,000 mcg by mouth daily.   Marland Kitchen co-enzyme Q-10 30 MG capsule Take 30 mg by mouth 2 (two) times daily.   Marland Kitchen  ezetimibe (ZETIA) 10 MG tablet Take 1 tablet by mouth once daily (Patient taking differently: Take 10 mg by mouth daily. )  . famotidine (PEPCID) 40 MG tablet Take 1 tablet (40 mg total) by mouth at bedtime.  . fish oil-omega-3 fatty acids 1000 MG capsule Take 1 g by mouth daily.   . furosemide (LASIX) 40 MG tablet Take 1 tablet (40 mg total) by mouth daily.  . metoprolol tartrate (LOPRESSOR) 50 MG tablet Take 1.5 tablets (75 mg total) by mouth 2 (two) times daily.  . Multiple Vitamins-Minerals (CENTRUM SILVER PO) Take 1 tablet by mouth every morning.   Marland Kitchen omeprazole (PRILOSEC) 40 MG capsule TAKE 1 CAPSULE BY MOUTH TWICE DAILY BEFORE MEAL(S) (Patient taking differently: Take 40 mg by mouth daily. )   Past Medical History:  Diagnosis Date  . Acute meniscal tear of knee LEFT KNEE  . Allergic rhinitis   . Arthritis    "knees" (02/19/2017)  . Atrial fibrillation ([place])   . Breast cancer, right breast ([place]) 1986  . 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 Diverticulitis of large intestine with perforation 11/02/2011   Microperforation probably related to nut and popcorn consumption.  Resolved by CT scan Recurrent episode clinical dx 07/2012   . Diverticulosis of colon    sigmoid  . GERD (gastroesophageal reflux disease)   . H/O hiatal hernia   . History of kidney stones    "passed it" (02/19/2017)  . Hypercholesterolemia    history  . Left knee pain    occasionally  . Palpitations IRREGULAR HEARTBEAT--  CONTROLLED W/  BETA BLOCKER  . Pneumonia 2017   "walking pneumonia" (02/19/2017)  . Pre-diabetes    pcp is monitoring a1c currently 02/10/17  . Swelling of left knee joint    Past Surgical History:  Procedure Laterality Date  . BREAST BIOPSY Right 1986  . BREAST BIOPSY Left   . BREAST RECONSTRUCTION Right 1987   W/ IMPLANTS  . CARDIAC CATHETERIZATION  12-22-2000  DR BRACKBILL   NORMAL LVF/ NORMAL CORONARY ARTERIES  . CARDIOVERSION N/A 03/23/2020   Procedure: CARDIOVERSION;  Surgeon:  Donato Heinz, MD;  Location: [place];  Service: Cardiovascular;  Laterality: N/A;  . CHOLECYSTECTOMY OPEN  1988  . CHONDROPLASTY  09/08/2012   Procedure: CHONDROPLASTY;  Surgeon: Tobi Bastos, MD;  [facility];  Service: Orthopedics;  Laterality: Left;  . COLONOSCOPY     last colon 05-03-2009 (02/19/2017)  . COLONOSCOPY W/ BIOPSIES AND POLYPECTOMY    . KNEE ARTHROSCOPY WITH MEDIAL MENISECTOMY  09/08/2012   Procedure: KNEE ARTHROSCOPY WITH MEDIAL MENISECTOMY;  Surgeon: Tobi Bastos, MD;  Location: [place];  Service: Orthopedics;  Laterality: Left;  lateral tibial plateau,   . MASTECTOMY Right 1986   W/ MULTIPLE NODE DISSECTION  . REDUCTION MAMMAPLASTY Left 1987  . TOTAL KNEE  ARTHROPLASTY Left 07/21/2013   Procedure: TOTAL KNEE ARTHROPLASTY;  Surgeon: Newt Minion, MD;  Location: [place];  Service: Orthopedics;  Laterality: Left;  Left Total Knee Arthroplasty  . TOTAL KNEE ARTHROPLASTY Right 02/19/2017   Procedure: RIGHT TOTAL KNEE ARTHROPLASTY;  Surgeon: Newt Minion, MD;  Location: [place];  Service: Orthopedics;  Laterality: Right;  Marland Kitchen VAGINAL HYSTERECTOMY  1966   Social History   Social History Narrative   She is married and retired and has 2 grown children   1 glass of wine a month no tobacco no drug use   family history includes Brain cancer in her sister; Cancer in her brother, sister, and sister; Colon cancer in her brother; Dementia in her maternal grandfather and sister; Heart attack in her father; Heart disease in her daughter; Heart failure in her mother; Leukemia in her brother; Lung cancer in her sister.   Review of Systems As above  Objective:   Physical Exam BP 112/74   Pulse 57   Ht 5\' 2"  (1.575 m)   Wt 161 lb (73 kg)   BMI 29.45 kg/m  NAD abd soft mildly tender LLQ no mass

## 2020-05-25 NOTE — Telephone Encounter (Signed)
 [facility] Pre-operative Risk Assessment     Request for surgical clearance:     Endoscopy Procedure  What type of surgery is being performed?     Colonoscopy   When is this surgery scheduled?     06/09/2020  What type of clearance is required ?   Pharmacy  Are there any medications that need to be held prior to surgery and how long? Eliquis, 2 days  Practice name and name of physician performing surgery?      [facility]  What is your office phone and fax number?      Phone- [phone]  Fax470 [phone]  Anesthesia type (None, local, MAC, general) ?       MAC

## 2020-05-25 NOTE — Patient Instructions (Signed)
We have sent the following medications to your pharmacy for you to pick up at your convenience: Generic Levsin-use the [place] card to get this.   You have been scheduled for a colonoscopy. Please follow written instructions given to you at your visit today.  Please pick up your prep supplies at the pharmacy within the next 1-3 days. If you use inhalers (even only as needed), please bring them with you on the day of your procedure.   You will be contaced by our office prior to your procedure for directions on holding your Eliquis.  If you do not hear from our office 1 week prior to your scheduled procedure, please call [phone] to discuss.   I appreciate the opportunity to care for you. Ronney Lion, [facility]

## 2020-05-26 NOTE — Telephone Encounter (Signed)
   Primary Cardiologist: Pixie Casino, MD  Chart reviewed as part of pre-operative protocol coverage. Given past medical history and time since last visit, based on ACC/AHA guidelines, [place] would be at acceptable risk for the planned procedure without further cardiovascular testing.   Patient with diagnosis of afib on Eliquis for anticoagulation.    Procedure: Colonoscopy Date of procedure: 06/09/20  CHADS2-VASc score of  3 (AGE, AGE, female)  CrCl 38 ml/min  Per office protocol, patient can hold Eliquis for 2 days prior to procedure.    I will route this recommendation to the requesting party via Epic fax function and remove from pre-op pool.  Please call with questions.  Jossie Ng. Jillaine Waren NP-C    05/26/2020, 10:15 AM [place] [address] Office [phone] Fax [phone]

## 2020-05-26 NOTE — Telephone Encounter (Signed)
Patient with diagnosis of afib on Eliquis for anticoagulation.    Procedure: Colonoscopy Date of procedure: 06/09/20  CHADS2-VASc score of  3 (AGE, AGE, female)  CrCl 38 ml/min  Per office protocol, patient can hold Eliquis for 2 days prior to procedure.

## 2020-05-29 ENCOUNTER — Encounter: Payer: Self-pay | Admitting: Internal Medicine

## 2020-06-01 NOTE — Telephone Encounter (Signed)
Patient informed to hold her Eliquis for 2 days prior to procedure. She verbalized understanding.

## 2020-06-01 NOTE — Telephone Encounter (Signed)
Leslie Bryant is concerned she has not heard back on holding her blood thinner yet. Please return her call to [phone].

## 2020-06-09 ENCOUNTER — Ambulatory Visit (AMBULATORY_SURGERY_CENTER): Payer: Medicare Other | Admitting: Internal Medicine

## 2020-06-09 ENCOUNTER — Encounter: Payer: Self-pay | Admitting: Internal Medicine

## 2020-06-09 ENCOUNTER — Other Ambulatory Visit: Payer: Self-pay

## 2020-06-09 VITALS — BP 132/60 | HR 78 | Temp 96.0°F | Resp 13 | Ht 62.0 in | Wt 161.0 lb

## 2020-06-09 DIAGNOSIS — R195 Other fecal abnormalities: Secondary | ICD-10-CM

## 2020-06-09 DIAGNOSIS — D120 Benign neoplasm of cecum: Secondary | ICD-10-CM | POA: Diagnosis not present

## 2020-06-09 DIAGNOSIS — K5730 Diverticulosis of large intestine without perforation or abscess without bleeding: Secondary | ICD-10-CM

## 2020-06-09 DIAGNOSIS — K635 Polyp of colon: Secondary | ICD-10-CM | POA: Diagnosis not present

## 2020-06-09 DIAGNOSIS — D122 Benign neoplasm of ascending colon: Secondary | ICD-10-CM | POA: Diagnosis not present

## 2020-06-09 DIAGNOSIS — R194 Change in bowel habit: Secondary | ICD-10-CM

## 2020-06-09 MED ORDER — SODIUM CHLORIDE 0.9 % IV SOLN: 500.0000 mL | Freq: Once | INTRAVENOUS | Status: DC

## 2020-06-09 NOTE — Op Note (Signed)
[place] Patient Name: Leslie Bryant Procedure Date: 06/09/2020 3:50 PM MRN: [id number] Endoscopist: Gatha Mayer , MD Age: 79 Referring MD:  Date of Birth: [birth year] Gender: Female Account #: [id number] Procedure:                Colonoscopy Indications:              Change in bowel habits Medicines:                Propofol per Anesthesia, Monitored Anesthesia Care Procedure:                Pre-Anesthesia Assessment:                           - Prior to the procedure, a History and Physical                            was performed, and patient medications and                            allergies were reviewed. The patient's tolerance of                            previous anesthesia was also reviewed. The risks                            and benefits of the procedure and the sedation                            options and risks were discussed with the patient.                            All questions were answered, and informed consent                            was obtained. Prior Anticoagulants: The patient                            last took Eliquis (apixaban) 2 days prior to the                            procedure. ASA Grade Assessment: III - A patient                            with severe systemic disease. After reviewing the                            risks and benefits, the patient was deemed in                            satisfactory condition to undergo the procedure.                           After obtaining informed consent, the colonoscope  was passed under direct vision. Throughout the                            procedure, the patient's blood pressure, pulse, and                            oxygen saturations were monitored continuously. The                            Colonoscope was introduced through the anus and                            advanced to the the cecum, identified by                            appendiceal orifice and  ileocecal valve. The                            colonoscopy was performed without difficulty. The                            patient tolerated the procedure well. The quality                            of the bowel preparation was good. The bowel                            preparation used was Miralax via split dose                            instruction. The ileocecal valve, appendiceal                            orifice, and rectum were photographed. Scope In: 4:04:52 PM Scope Out: 4:21:11 PM Scope Withdrawal Time: 0 hours 11 minutes 53 seconds  Total Procedure Duration: 0 hours 16 minutes 19 seconds  Findings:                 The perianal and digital rectal examinations were                            normal.                           Three sessile polyps were found in the ascending                            colon and cecum. The polyps were diminutive in                            size. These polyps were removed with a cold snare.                            Resection and retrieval were complete. Verification  of patient identification for the specimen was                            done. Estimated blood loss was minimal.                           Multiple diverticula were found in the sigmoid                            colon.                           The exam was otherwise without abnormality on                            direct and retroflexion views. Complications:            No immediate complications. Estimated Blood Loss:     Estimated blood loss was minimal. Impression:               - Three diminutive polyps in the ascending colon                            and in the cecum, removed with a cold snare.                            Resected and retrieved.                           - Diverticulosis in the sigmoid colon.                           - The examination was otherwise normal on direct                            and retroflexion  views. Recommendation:           - Patient has a contact number available for                            emergencies. The signs and symptoms of potential                            delayed complications were discussed with the                            patient. Return to normal activities tomorrow.                            Written discharge instructions were provided to the                            patient.                           - Resume previous diet.                           -  Continue present medications.                           - Resume Eliquis (apixaban) at prior dose tomorrow.                           - Await pathology results.                           - No repeat colonoscopy due to age.                           - continue hyoscyamine for abdomina cramps leading                            to nausea/vomiting - it is helping Gatha Mayer, MD 06/09/2020 4:31:11 PM This report has been signed electronically.

## 2020-06-09 NOTE — Progress Notes (Signed)
pt tolerated well. VSS. awake and to recovery. Report given to RN.  

## 2020-06-09 NOTE — Progress Notes (Signed)
Called to room to assist during endoscopic procedure.  Patient ID and intended procedure confirmed with present staff. Received instructions for my participation in the procedure from the performing physician.  

## 2020-06-09 NOTE — Patient Instructions (Addendum)
I found and removed 3 tiny polyps that look benign.  You also have a condition called diverticulosis. Please read the handout provided.  I am glad the hyoscyamine is helping - you must be having spasms of the intestine causing problems - that medicine stops those.  I appreciate the opportunity to care for you. Gatha Mayer, MD, [facility]  Handouts given;  Diverticulosis, Polyps  Resume previous diet  continue present medications  resume Eliquis tomorrow Await pathology results  YOU HAD AN ENDOSCOPIC PROCEDURE TODAY AT [place]:   Refer to the procedure report that was given to you for any specific questions about what was found during the examination.  If the procedure report does not answer your questions, please call your gastroenterologist to clarify.  If you requested that your care partner not be given the details of your procedure findings, then the procedure report has been included in a sealed envelope for you to review at your convenience later.  YOU SHOULD EXPECT: Some feelings of bloating in the abdomen. Passage of more gas than usual.  Walking can help get rid of the air that was put into your GI tract during the procedure and reduce the bloating. If you had a lower endoscopy (such as a colonoscopy or flexible sigmoidoscopy) you may notice spotting of blood in your stool or on the toilet paper. If you underwent a bowel prep for your procedure, you may not have a normal bowel movement for a few days.  Please Note:  You might notice some irritation and congestion in your nose or some drainage.  This is from the oxygen used during your procedure.  There is no need for concern and it should clear up in a day or so.  SYMPTOMS TO REPORT IMMEDIATELY:   Following lower endoscopy (colonoscopy or flexible sigmoidoscopy):  Excessive amounts of blood in the stool  Significant tenderness or worsening of abdominal pains  Swelling of the abdomen that is new, acute  Fever  of 100F or higher    For urgent or emergent issues, a gastroenterologist can be reached at any hour by calling [phone]. Do not use MyChart messaging for urgent concerns.    DIET:  We do recommend a small meal at first, but then you may proceed to your regular diet.  Drink plenty of fluids but you should avoid alcoholic beverages for 24 hours.  ACTIVITY:  You should plan to take it easy for the rest of today and you should NOT DRIVE or use heavy machinery until tomorrow (because of the sedation medicines used during the test).    FOLLOW UP: Our staff will call the number listed on your records 48-72 hours following your procedure to check on you and address any questions or concerns that you may have regarding the information given to you following your procedure. If we do not reach you, we will leave a message.  We will attempt to reach you two times.  During this call, we will ask if you have developed any symptoms of COVID 19. If you develop any symptoms (ie: fever, flu-like symptoms, shortness of breath, cough etc.) before then, please call [phone].  If you test positive for Covid 19 in the 2 weeks post procedure, please call and report this information to [place].    If any biopsies were taken you will be contacted by phone or by letter within the next 1-3 weeks.  Please call us at [phone] if you have not heard  about the biopsies in 3 weeks.    SIGNATURES/CONFIDENTIALITY: You and/or your care partner have signed paperwork which will be entered into your electronic medical record.  These signatures attest to the fact that that the information above on your After Visit Summary has been reviewed and is understood.  Full responsibility of the confidentiality of this discharge information lies with you and/or your care-partner.

## 2020-06-09 NOTE — Progress Notes (Signed)
VS taken by [place] 

## 2020-06-13 ENCOUNTER — Telehealth: Payer: Self-pay | Admitting: *Deleted

## 2020-06-13 NOTE — Telephone Encounter (Signed)
  Follow up Call-  Call back number 06/09/2020  Post procedure Call Back phone  # [phone]  Permission to leave phone message Yes  Some recent data might be hidden     Patient questions:  Do you have a fever, pain , or abdominal swelling? No. Pain Score  0 *  Have you tolerated food without any problems? Yes.    Have you been able to return to your normal activities? Yes.    Do you have any questions about your discharge instructions: Diet   No. Medications  No. Follow up visit  No.  Do you have questions or concerns about your Care? No.  Actions: * If pain score is 4 or above: No action needed, pain <4.  1. Have you developed a fever since your procedure? no  2.   Have you had an respiratory symptoms (SOB or cough) since your procedure? no  3.   Have you tested positive for COVID 19 since your procedure no  4.   Have you had any family members/close contacts diagnosed with the COVID 19 since your procedure?  no   If yes to any of these questions please route to Joylene John, RN and Erenest Rasher, RN

## 2020-06-14 ENCOUNTER — Telehealth: Payer: Self-pay | Admitting: Internal Medicine

## 2020-06-14 NOTE — Telephone Encounter (Signed)
Attempted phone call to pt.  Left voicemail message re: previous call.  OK per chart to leave detailed message on voicemail. Message left and pt advised previous call most likely a reminder call for appointment 06/19/2020 with Dr Caryl Comes.  Pt may call RN at [phone] with any further questions.

## 2020-06-14 NOTE — Telephone Encounter (Signed)
Leslie Bryant is calling stating she is returning a phone call, but I was unable to find any documentation of the call. Please advise.

## 2020-06-15 DIAGNOSIS — Z20828 Contact with and (suspected) exposure to other viral communicable diseases: Secondary | ICD-10-CM | POA: Diagnosis not present

## 2020-06-15 DIAGNOSIS — Z20822 Contact with and (suspected) exposure to covid-19: Secondary | ICD-10-CM | POA: Diagnosis not present

## 2020-06-19 ENCOUNTER — Ambulatory Visit: Payer: Medicare Other | Admitting: Internal Medicine

## 2020-06-19 ENCOUNTER — Encounter: Payer: Self-pay | Admitting: Internal Medicine

## 2020-06-19 ENCOUNTER — Other Ambulatory Visit: Payer: Self-pay

## 2020-06-19 VITALS — BP 114/70 | HR 84 | Ht 62.0 in | Wt 161.2 lb

## 2020-06-19 DIAGNOSIS — I5043 Acute on chronic combined systolic (congestive) and diastolic (congestive) heart failure: Secondary | ICD-10-CM

## 2020-06-19 DIAGNOSIS — I480 Paroxysmal atrial fibrillation: Secondary | ICD-10-CM

## 2020-06-19 DIAGNOSIS — G4730 Sleep apnea, unspecified: Secondary | ICD-10-CM

## 2020-06-19 MED ORDER — AMIODARONE HCL 200 MG PO TABS
ORAL_TABLET | ORAL | 0 refills | Status: DC
Start: 2020-06-19 — End: 2020-07-17

## 2020-06-19 MED ORDER — AMIODARONE HCL 200 MG PO TABS: 200.0000 mg | ORAL_TABLET | Freq: Every day | ORAL | 3 refills | Status: AC

## 2020-06-19 NOTE — Patient Instructions (Signed)
Medication Instructions:  Your physician has recommended you make the following change in your medication:  Begin Amiodarone 200mg  2 tablets by mouth (400mg ) twice daily x 2 weeks 200mg  1 tablet daily twice daily x 2 weeks 200mg  1 tablet by mouth daily  *If you need a refill on your cardiac medications before your next appointment, please call your pharmacy*   Lab Work: None ordered.  If you have labs (blood work) drawn today and your tests are completely normal, you will receive your results only by: Marland Kitchen MyChart Message (if you have MyChart) OR . A paper copy in the mail If you have any lab test that is abnormal or we need to change your treatment, we will call you to review the results.   Testing/Procedures:  To be scheduled for 3 weeks Your physician has recommended that you have a Cardioversion (DCCV). Electrical Cardioversion uses a jolt of electricity to your heart either through paddles or wired patches attached to your chest. This is a controlled, usually prescheduled, procedure. Defibrillation is done under light anesthesia in the hospital, and you usually go home the day of the procedure. This is done to get your heart back into a normal rhythm. You are not awake for the procedure. Please see the instruction sheet given to you today.  Your physician has recommended that you have a pulmonary function test. Pulmonary Function Tests are a group of tests that measure how well air moves in and out of your lungs.  Your physician has recommended that you have a sleep study. This test records several body functions during sleep, including: brain activity, eye movement, oxygen and carbon dioxide blood levels, heart rate and rhythm, breathing rate and rhythm, the flow of air through your mouth and nose, snoring, body muscle movements, and chest and belly movement.     Follow-Up: At [facility], you and your health needs are our priority.  As part of our continuing mission to provide  you with exceptional heart care, we have created designated Provider Care Teams.  These Care Teams include your primary Cardiologist (physician) and Advanced Practice Providers (APPs -  Physician Assistants and Nurse Practitioners) who all work together to provide you with the care you need, when you need it.  We recommend signing up for the patient portal called "MyChart".  Sign up information is provided on this After Visit Summary.  MyChart is used to connect with patients for Virtual Visits (Telemedicine).  Patients are able to view lab/test results, encounter notes, upcoming appointments, etc.  Non-urgent messages can be sent to your provider as well.   To learn more about what you can do with MyChart, go to [email or web address].    Your next appointment:    Afib clinic 6 weeks  Follow up with Dr Caryl Comes in 3 months

## 2020-06-19 NOTE — Progress Notes (Signed)
ELECTROPHYSIOLOGY CONSULT NOTE  Patient ID: [facility], MRN: [id number], DOB/AGE: 05-12-1941 79 y.o. Admit date: (Not on file) Date of Consult: 06/19/2020  Primary Physician: Biagio Borg, MD Primary Cardiologist: [facility]     [name] is a 79 y.o. female who is being seen today for the evaluation of AFib at the request of [place].    HPI [facility] is a 79 y.o. female found to have atrial fibrillation during an evaluation for shortness of breath.   Seen 4/21 in the ER for shortness of breath.  Found to have pulmonary edema.  Treated with furosemide. A monitor was subsequently placed demonstrating atrial fibrillation.  Anticoagulated with Apixoban    Initially associated with palpitations but not subsequently.  Underwent cardioversion with interval improvement in symptoms with a rapid reversion to atrial fibrillation Antiarrhythmic therapy was considered.  Myoview undertaken abnormal as below.  DATE TEST EF   2002 LHC  No obstructive CAD  4/21 Echo   60-65 % LA normal size  6/21 MYOVIEW   Perfusion defect apex        Date Cr K TSH LFT Hgb  6/21 1.12 3.9 1.3 32 12.7           Has noted worsening shortness of breath over recent weeks and months.  No peripheral edema.  No change in abdominal distention.  No nocturnal dyspnea orthopnea.  No lightheadedness.  Has sleep disordered breathing and daytime somnolence   Event Recorder personnally reviewed   A. fib about 95% of the time.  Average heart rate 96 with a range 66-174 the monitor reported some interval sinus rhythm but I could not see it on the monitor.  Thromboembolic risk factors ( age  -2 , Vasc disease -1, Gender-1) for a CHADSVASc Score of 4   Past Medical History:  Diagnosis Date  . Acute meniscal tear of knee LEFT KNEE  . Allergic rhinitis   . Allergy   . Arthritis    "knees" (02/19/2017)  . Atrial fibrillation ([facility])   . Breast cancer, right breast ([facility]) 1986  . 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  Diverticulitis of large intestine with perforation 11/02/2011   Microperforation probably related to nut and popcorn consumption.  Resolved by CT scan Recurrent episode clinical dx 07/2012   . Diverticulosis of colon    sigmoid  . GERD (gastroesophageal reflux disease)   . H/O hiatal hernia   . History of kidney stones    "passed it" (02/19/2017)  . Hypercholesterolemia    history  . Left knee pain    occasionally  . Palpitations IRREGULAR HEARTBEAT--  CONTROLLED W/  BETA BLOCKER  . Pneumonia 2017   "walking pneumonia" (02/19/2017)  . Pre-diabetes    pcp is monitoring a1c currently 02/10/17  . Swelling of left knee joint       Surgical History:  Past Surgical History:  Procedure Laterality Date  . BREAST BIOPSY Right 1986  . BREAST BIOPSY Left   . BREAST RECONSTRUCTION Right 1987   W/ IMPLANTS  . CARDIAC CATHETERIZATION  12-22-2000  DR BRACKBILL   NORMAL LVF/ NORMAL CORONARY ARTERIES  . CARDIOVERSION N/A 03/23/2020   Procedure: CARDIOVERSION;  Surgeon: Donato Heinz, MD;  Location: [facility];  Service: Cardiovascular;  Laterality: N/A;  . CHOLECYSTECTOMY OPEN  1988  . CHONDROPLASTY  09/08/2012   Procedure: CHONDROPLASTY;  Surgeon: Tobi Bastos, MD;  Location: [facility];  Service:  Orthopedics;  Laterality: Left;  . COLONOSCOPY     last colon 05-03-2009 (02/19/2017)  . COLONOSCOPY W/ BIOPSIES AND POLYPECTOMY    . KNEE ARTHROSCOPY WITH MEDIAL MENISECTOMY  09/08/2012   Procedure: KNEE ARTHROSCOPY WITH MEDIAL MENISECTOMY;  Surgeon: Tobi Bastos, MD;  Location: [facility];  Service: Orthopedics;  Laterality: Left;  lateral tibial plateau,   . MASTECTOMY Right 1986   W/ MULTIPLE NODE DISSECTION  . REDUCTION MAMMAPLASTY Left 1987  . TOTAL KNEE ARTHROPLASTY Left 07/21/2013   Procedure: TOTAL KNEE ARTHROPLASTY;  Surgeon: Newt Minion, MD;  Location: [facility];  Service: Orthopedics;  Laterality: Left;  Left Total Knee Arthroplasty  . TOTAL  KNEE ARTHROPLASTY Right 02/19/2017   Procedure: RIGHT TOTAL KNEE ARTHROPLASTY;  Surgeon: Newt Minion, MD;  Location: [facility];  Service: Orthopedics;  Laterality: Right;  Marland Kitchen VAGINAL HYSTERECTOMY  1966     Home Meds: Current Meds  Medication Sig  . apixaban (ELIQUIS) 5 MG TABS tablet Take 1 tablet (5 mg total) by mouth 2 (two) times daily.  Marland Kitchen BIOTIN 5000 PO Take 5,000 mcg by mouth daily.   Marland Kitchen co-enzyme Q-10 30 MG capsule Take 30 mg by mouth 2 (two) times daily.   Marland Kitchen ezetimibe (ZETIA) 10 MG tablet Take 10 mg by mouth daily.  . famotidine (PEPCID) 40 MG tablet Take 1 tablet (40 mg total) by mouth at bedtime.  . furosemide (LASIX) 40 MG tablet Take 1 tablet (40 mg total) by mouth daily.  Marland Kitchen Hyoscyamine Sulfate SL (LEVSIN/SL) 0.125 MG SUBL Place 1 tablet under the tongue 3 (three) times daily as needed (abdominal cramps).  . metoprolol tartrate (LOPRESSOR) 50 MG tablet Take 1.5 tablets (75 mg total) by mouth 2 (two) times daily.  . Multiple Vitamins-Minerals (CENTRUM SILVER PO) Take 1 tablet by mouth every morning.   Marland Kitchen omeprazole (PRILOSEC) 40 MG capsule Take 40 mg by mouth daily.    Allergies:  Allergies  Allergen Reactions  . Crestor [Rosuvastatin Calcium] Other (See Comments)    Shoulder pain  . Lipitor [Atorvastatin Calcium] Other (See Comments)    Myalgias   . Pravachol [Pravastatin] Other (See Comments)    myalgias  . Penicillins Itching    Has patient had a PCN reaction causing immediate rash, facial/tongue/throat swelling, SOB or lightheadedness with hypotension: No Has patient had a PCN reaction causing severe rash involving mucus membranes or skin necrosis: No Has patient had a PCN reaction that required hospitalization No Has patient had a PCN reaction occurring within the last 10 years: No If all of the above answers are "NO", then may proceed with Cephalosporin use.     Social History   Socioeconomic History  . Marital status: Married    Spouse name: Not on file  .  Number of children: 2  . Years of education: Not on file  . Highest education level: Not on file  Occupational History  . Occupation: Retired  Tobacco Use  . Smoking status: Never Smoker  . Smokeless tobacco: Never Used  Vaping Use  . Vaping Use: Never used  Substance and Sexual Activity  . Alcohol use: Yes    Alcohol/week: 1.0 standard drink    Types: 1 Glasses of wine per week    Comment: social wine once a month  . Drug use: No  . Sexual activity: Yes  Other Topics Concern  . Not on file  Social History Narrative   She is married and retired and has 2 grown children  1 glass of wine a month no tobacco no drug use   Social Determinants of Radio broadcast assistant Strain:   . Difficulty of Paying Living Expenses:   Food Insecurity:   . Worried About Charity fundraiser in the Last Year:   . Arboriculturist in the Last Year:   Transportation Needs:   . Film/video editor (Medical):   Marland Kitchen Lack of Transportation (Non-Medical):   Physical Activity:   . Days of Exercise per Week:   . Minutes of Exercise per Session:   Stress:   . Feeling of Stress :   Social Connections:   . Frequency of Communication with Friends and Family:   . Frequency of Social Gatherings with Friends and Family:   . Attends Religious Services:   . Active Member of Clubs or Organizations:   . Attends Archivist Meetings:   Marland Kitchen Marital Status:   Intimate Partner Violence:   . Fear of Current or Ex-Partner:   . Emotionally Abused:   Marland Kitchen Physically Abused:   . Sexually Abused:      Family History  Problem Relation Age of Onset  . Heart attack Father   . Heart failure Mother        CHF  . Dementia Maternal Grandfather   . Dementia Sister   . Colon cancer Brother        died/age 33  . Esophageal cancer Brother   . Lung cancer Sister   . Brain cancer Sister   . Cancer Brother        mouth  . Rectal cancer Brother   . Leukemia Brother   . Cancer Sister        type unknown  .  Cancer Sister        type unknown  . Heart disease Daughter   . Stomach cancer Neg Hx      ROS:  Please see the history of present illness.     All other systems reviewed and negative.    Physical Exam: Blood pressure 114/70, pulse 84, height 5\' 2"  (1.575 m), weight 161 lb 3.2 oz (73.1 kg), SpO2 96 %. General: Well developed, well nourished female in no acute distress. Head: Normocephalic, atraumatic, sclera non-icteric, no xanthomas, nares are without discharge. EENT: normal  Lymph Nodes:  none Neck: Negative for carotid bruits. JVD not elevated. Back:without scoliosis kyphosis Lungs: Clear bilaterally to auscultation without wheezes, rales, or rhonchi. Breathing is unlabored. Heart: Irregularly irregular rate and rhythm without murmur . No rubs, or gallops appreciated. Abdomen: Soft, non-tender, non-distended with normoactive bowel sounds. No hepatomegaly. No rebound/guarding. No obvious abdominal masses. Msk:  Strength and tone appear normal for age. Extremities: No clubbing or cyanosis. No edema.  Distal pedal pulses are 2+ and equal bilaterally. Skin: Warm and Dry Neuro: Alert and oriented X 3. CN III-XII intact Grossly normal sensory and motor function . Psych:  Responds to questions appropriately with a normal affect.      Labs: Cardiac Enzymes No results for input(s): CKTOTAL, CKMB, TROPONINI in the last 72 hours. CBC Lab Results  Component Value Date   WBC 8.2 05/03/2020   HGB 12.7 05/03/2020   HCT 37.3 05/03/2020   MCV 92.0 05/03/2020   PLT 281.0 05/03/2020   PROTIME: No results for input(s): LABPROT, INR in the last 72 hours. Chemistry No results for input(s): NA, K, CL, CO2, BUN, CREATININE, CALCIUM, PROT, BILITOT, ALKPHOS, ALT, AST, GLUCOSE in the last 168 hours.  Invalid input(s): LABALBU Lipids Lab Results  Component Value Date   CHOL 163 05/03/2020   HDL 43.70 05/03/2020   LDLCALC 116 (H) 11/09/2019   TRIG 228.0 (H) 05/03/2020   BNP No results  found for: PROBNP Thyroid Function Tests: No results for input(s): TSH, T4TOTAL, T3FREE, THYROIDAB in the last 72 hours.  Invalid input(s): FREET3 Miscellaneous Lab Results  Component Value Date   DDIMER 1.16 (H) 09/22/2017    Radiology/Studies:  No results found.  EKG: Atrial fibrillation at 83 Intervals-/07/38 with a QTC of 345.  ECG reviewed personally following cardioversion QTC was not 480 ms    Assessment and Plan:   PVCs  Atrial fib-persistent  Sleep disordered breathing  Congestive heart failure-chronic-diastolic-class IIb  The patient is having persistent atrial fibrillation associated with congestive symptoms.  Restoration of sinus rhythm is indicated especially given the fact that she felt considerably better with the transient sinus rhythm following her last cardioversion.  Discussions of antiarrhythmic drugs including 1CS, class III's and amiodarone.  We reviewed her Myoview scan.  There is a fixed perfusion apical defect which presumably is related to coronary disease as interpreted by Dr. Debara Pickett.  Hence, 1C would be contraindicated.  Her QTC both in atrial fibrillation as well as following cardioversion is long and hence, class III's are not appropriate either.  Discussed the role of amiodarone with its benefits and risks.  She is agreeable to proceeding.  We will begin her on 400 twice daily x2 weeks followed by 200 twice daily x2 weeks.  Anticipate cardioversion in 3 to 4-week timeframe.  After 4 weeks, she should be on 200 mg daily.  Amiodarone surveillance laboratories were normal in June.  She will need baseline pulmonary function testing.  Anticipate repeat labs in 6 weeks and 12 weeks.  In the interim, would recommend a sleep study given her sleep disordered breathing.     Virl Axe

## 2020-06-22 ENCOUNTER — Ambulatory Visit (INDEPENDENT_AMBULATORY_CARE_PROVIDER_SITE_OTHER): Payer: Medicare Other | Admitting: Internal Medicine

## 2020-06-22 ENCOUNTER — Other Ambulatory Visit: Payer: Self-pay

## 2020-06-22 ENCOUNTER — Other Ambulatory Visit: Payer: Self-pay | Admitting: Internal Medicine

## 2020-06-22 DIAGNOSIS — I480 Paroxysmal atrial fibrillation: Secondary | ICD-10-CM | POA: Diagnosis not present

## 2020-06-22 DIAGNOSIS — I5043 Acute on chronic combined systolic (congestive) and diastolic (congestive) heart failure: Secondary | ICD-10-CM

## 2020-06-22 LAB — PULMONARY FUNCTION TEST
DL/VA % pred: 106 %
DL/VA: 4.42 ml/min/mmHg/L
DLCO cor % pred: 84 %
DLCO cor: 14.8 ml/min/mmHg
DLCO unc % pred: 84 %
DLCO unc: 14.8 ml/min/mmHg
FEF 25-75 Post: 4.18 L/sec
FEF 25-75 Pre: 2.52 L/sec
FEF2575-%Change-Post: 65 %
FEF2575-%Pred-Post: 310 %
FEF2575-%Pred-Pre: 187 %
FEV1-%Change-Post: 12 %
FEV1-%Pred-Post: 107 %
FEV1-%Pred-Pre: 95 %
FEV1-Post: 1.93 L
FEV1-Pre: 1.71 L
FEV1FVC-%Change-Post: 3 %
FEV1FVC-%Pred-Pre: 117 %
FEV6-%Change-Post: 9 %
FEV6-%Pred-Post: 94 %
FEV6-%Pred-Pre: 86 %
FEV6-Post: 2.14 L
FEV6-Pre: 1.96 L
FEV6FVC-%Pred-Post: 105 %
FEV6FVC-%Pred-Pre: 105 %
FVC-%Change-Post: 9 %
FVC-%Pred-Post: 88 %
FVC-%Pred-Pre: 81 %
FVC-Post: 2.14 L
FVC-Pre: 1.96 L
Post FEV1/FVC ratio: 90 %
Post FEV6/FVC ratio: 100 %
Pre FEV1/FVC ratio: 87 %
Pre FEV6/FVC Ratio: 100 %
RV % pred: 76 %
RV: 1.73 L
TLC % pred: 78 %
TLC: 3.75 L

## 2020-06-22 NOTE — Progress Notes (Signed)
Full PFT performed today. °

## 2020-06-23 ENCOUNTER — Telehealth: Payer: Self-pay | Admitting: Internal Medicine

## 2020-06-23 DIAGNOSIS — I4891 Unspecified atrial fibrillation: Secondary | ICD-10-CM

## 2020-06-23 DIAGNOSIS — Z01812 Encounter for preprocedural laboratory examination: Secondary | ICD-10-CM

## 2020-06-23 NOTE — Telephone Encounter (Signed)
Attempted phone call to pt to discuss Cardioversion Instruction Letter.  Left voicemail message to contact RN at [phone].

## 2020-06-23 NOTE — Telephone Encounter (Signed)
Spoke with pt and reviewed Cardioversion Instruction letter with pt.  Cardioversion scheduled for 07/20/2020 at 10am with arrival at 9am.  See instruction letter for complete details.  Pt verbalizes understanding and agrees with current plan.

## 2020-06-23 NOTE — Telephone Encounter (Signed)
Patient returning Marsha's call.

## 2020-07-01 ENCOUNTER — Emergency Department (HOSPITAL_COMMUNITY)
Admission: EM | Admit: 2020-07-01 | Discharge: 2020-07-01 | Disposition: A | Discharge status: Home / Self Care | Payer: Medicare Other | Attending: Emergency Medicine | Admitting: Emergency Medicine

## 2020-07-01 ENCOUNTER — Encounter (HOSPITAL_COMMUNITY): Payer: Self-pay | Admitting: Emergency Medicine

## 2020-07-01 ENCOUNTER — Other Ambulatory Visit: Payer: Self-pay

## 2020-07-01 DIAGNOSIS — Z5321 Procedure and treatment not carried out due to patient leaving prior to being seen by health care provider: Secondary | ICD-10-CM | POA: Insufficient documentation

## 2020-07-01 DIAGNOSIS — R110 Nausea: Secondary | ICD-10-CM | POA: Insufficient documentation

## 2020-07-01 DIAGNOSIS — R251 Tremor, unspecified: Secondary | ICD-10-CM | POA: Diagnosis not present

## 2020-07-01 LAB — URINALYSIS, ROUTINE W REFLEX MICROSCOPIC
Bilirubin Urine: NEGATIVE
Glucose, UA: NEGATIVE mg/dL
Hgb urine dipstick: NEGATIVE
Ketones, ur: NEGATIVE mg/dL
Nitrite: NEGATIVE
Protein, ur: NEGATIVE mg/dL
Specific Gravity, Urine: 1.012 (ref 1.005–1.030)
pH: 5 (ref 5.0–8.0)

## 2020-07-01 LAB — CBC
HCT: 34.7 % — ABNORMAL LOW (ref 36.0–46.0)
Hemoglobin: 11.7 g/dL — ABNORMAL LOW (ref 12.0–15.0)
MCH: 31.4 pg (ref 26.0–34.0)
MCHC: 33.7 g/dL (ref 30.0–36.0)
MCV: 93 fL (ref 80.0–100.0)
Platelets: 285 10*3/uL (ref 150–400)
RBC: 3.73 MIL/uL — ABNORMAL LOW (ref 3.87–5.11)
RDW: 12.7 % (ref 11.5–15.5)
WBC: 6.5 10*3/uL (ref 4.0–10.5)
nRBC: 0 % (ref 0.0–0.2)

## 2020-07-01 LAB — COMPREHENSIVE METABOLIC PANEL
ALT: 27 U/L (ref 0–44)
AST: 28 U/L (ref 15–41)
Albumin: 3.5 g/dL (ref 3.5–5.0)
Alkaline Phosphatase: 53 U/L (ref 38–126)
Anion gap: 12 (ref 5–15)
BUN: 13 mg/dL (ref 8–23)
CO2: 23 mmol/L (ref 22–32)
Calcium: 9.1 mg/dL (ref 8.9–10.3)
Chloride: 97 mmol/L — ABNORMAL LOW (ref 98–111)
Creatinine, Ser: 1.04 mg/dL — ABNORMAL HIGH (ref 0.44–1.00)
GFR calc Af Amer: 59 mL/min — ABNORMAL LOW (ref >60)
GFR calc non Af Amer: 51 mL/min — ABNORMAL LOW (ref >60)
Glucose, Bld: 137 mg/dL — ABNORMAL HIGH (ref 70–99)
Potassium: 3.7 mmol/L (ref 3.5–5.1)
Sodium: 132 mmol/L — ABNORMAL LOW (ref 135–145)
Total Bilirubin: 0.7 mg/dL (ref 0.3–1.2)
Total Protein: 6.8 g/dL (ref 6.5–8.1)

## 2020-07-01 LAB — LIPASE, BLOOD: Lipase: 34 U/L (ref 11–51)

## 2020-07-01 NOTE — ED Notes (Signed)
Pt stated she is leaving. 

## 2020-07-01 NOTE — ED Triage Notes (Signed)
Pt. Stated, I woke up this morning just not feeling good. I was nauseated and shaky.

## 2020-07-03 ENCOUNTER — Telehealth: Payer: Self-pay | Admitting: Internal Medicine

## 2020-07-03 DIAGNOSIS — Z0120 Encounter for dental examination and cleaning without abnormal findings: Secondary | ICD-10-CM | POA: Diagnosis not present

## 2020-07-03 NOTE — Telephone Encounter (Signed)
Called patient, she states that she went to the ED but left because of the wait time- I did advise with patient on her symptoms, she should go back to the ED as she was SOB on the phone, she was having on and off chest pain, no change in meds, and did not have recent BP/HR. She is complaining of numbness in her arm and and the pain in her chest radiates into her back area.  Advised I would send a message over to Dr.Hilty to make aware.

## 2020-07-03 NOTE — Telephone Encounter (Signed)
Agree -with those symptoms, not much we can do in the office. If it does not improve, would seek emergency care.  Dr Lemmie Evens

## 2020-07-03 NOTE — Telephone Encounter (Signed)
Patient states on 07/01/20 she went to the ER due to feeling extremely nauseous. She states she has also been experiencing pain on the left side of her face, towards her temple. The left sided pain is coming and going, however, it is enough to concern her because she had similar symptoms when she had a heart attack in the past. Please call to discuss.

## 2020-07-06 ENCOUNTER — Other Ambulatory Visit: Payer: Self-pay | Admitting: Internal Medicine

## 2020-07-15 ENCOUNTER — Other Ambulatory Visit: Payer: Self-pay | Admitting: Internal Medicine

## 2020-07-17 ENCOUNTER — Other Ambulatory Visit: Payer: Medicare Other | Admitting: *Deleted

## 2020-07-17 ENCOUNTER — Other Ambulatory Visit: Payer: Self-pay

## 2020-07-17 ENCOUNTER — Other Ambulatory Visit (HOSPITAL_COMMUNITY)
Admission: RE | Admit: 2020-07-17 | Discharge: 2020-07-17 | Disposition: A | Discharge status: Home / Self Care | Payer: Medicare Other | Source: Ambulatory Visit | Attending: Cardiology | Admitting: Cardiology

## 2020-07-17 DIAGNOSIS — I4891 Unspecified atrial fibrillation: Secondary | ICD-10-CM | POA: Diagnosis not present

## 2020-07-17 DIAGNOSIS — Z20822 Contact with and (suspected) exposure to covid-19: Secondary | ICD-10-CM | POA: Diagnosis not present

## 2020-07-17 DIAGNOSIS — Z01812 Encounter for preprocedural laboratory examination: Secondary | ICD-10-CM | POA: Insufficient documentation

## 2020-07-17 LAB — SARS CORONAVIRUS 2 (TAT 6-24 HRS): SARS Coronavirus 2: NEGATIVE

## 2020-07-18 LAB — BASIC METABOLIC PANEL
BUN/Creatinine Ratio: 12 (ref 12–28)
BUN: 15 mg/dL (ref 8–27)
CO2: 25 mmol/L (ref 20–29)
Calcium: 9.2 mg/dL (ref 8.7–10.3)
Chloride: 98 mmol/L (ref 96–106)
Creatinine, Ser: 1.22 mg/dL — ABNORMAL HIGH (ref 0.57–1.00)
GFR calc Af Amer: 48 mL/min/{1.73_m2} — ABNORMAL LOW (ref >59)
GFR calc non Af Amer: 42 mL/min/{1.73_m2} — ABNORMAL LOW (ref >59)
Glucose: 113 mg/dL — ABNORMAL HIGH (ref 65–99)
Potassium: 3.8 mmol/L (ref 3.5–5.2)
Sodium: 138 mmol/L (ref 134–144)

## 2020-07-18 LAB — CBC
Hematocrit: 36.1 % (ref 34.0–46.6)
Hemoglobin: 11.8 g/dL (ref 11.1–15.9)
MCH: 30 pg (ref 26.6–33.0)
MCHC: 32.7 g/dL (ref 31.5–35.7)
MCV: 92 fL (ref 79–97)
Platelets: 275 10*3/uL (ref 150–450)
RBC: 3.93 x10E6/uL (ref 3.77–5.28)
RDW: 12.5 % (ref 11.7–15.4)
WBC: 8.3 10*3/uL (ref 3.4–10.8)

## 2020-07-20 ENCOUNTER — Ambulatory Visit (HOSPITAL_COMMUNITY)
Admission: RE | Admit: 2020-07-20 | Discharge: 2020-07-20 | Disposition: A | Discharge status: Home / Self Care | Payer: Medicare Other | Attending: Cardiology | Admitting: Cardiology

## 2020-07-20 ENCOUNTER — Encounter (HOSPITAL_COMMUNITY): Payer: Self-pay | Admitting: Cardiology

## 2020-07-20 ENCOUNTER — Ambulatory Visit (HOSPITAL_COMMUNITY): Payer: Medicare Other | Admitting: Anesthesiology

## 2020-07-20 ENCOUNTER — Encounter (HOSPITAL_COMMUNITY)
Admission: RE | Disposition: A | Discharge status: Home / Self Care | Payer: Medicare Other | Source: Home / Self Care | Attending: Cardiology

## 2020-07-20 ENCOUNTER — Other Ambulatory Visit: Payer: Self-pay

## 2020-07-20 ENCOUNTER — Telehealth: Payer: Self-pay

## 2020-07-20 DIAGNOSIS — I4891 Unspecified atrial fibrillation: Secondary | ICD-10-CM | POA: Diagnosis not present

## 2020-07-20 DIAGNOSIS — I10 Essential (primary) hypertension: Secondary | ICD-10-CM | POA: Diagnosis not present

## 2020-07-20 DIAGNOSIS — I4811 Longstanding persistent atrial fibrillation: Secondary | ICD-10-CM | POA: Diagnosis not present

## 2020-07-20 DIAGNOSIS — I471 Supraventricular tachycardia: Secondary | ICD-10-CM | POA: Diagnosis not present

## 2020-07-20 DIAGNOSIS — E785 Hyperlipidemia, unspecified: Secondary | ICD-10-CM | POA: Diagnosis not present

## 2020-07-20 HISTORY — PX: CARDIOVERSION: SHX1299

## 2020-07-20 SURGERY — CARDIOVERSION
Anesthesia: General

## 2020-07-20 MED ORDER — PROPOFOL 10 MG/ML IV BOLUS
INTRAVENOUS | Status: DC | PRN
  Administered 2020-07-20: 80 mg via INTRAVENOUS

## 2020-07-20 MED ORDER — SODIUM CHLORIDE 0.9 % IV SOLN: INTRAVENOUS | Status: DC | PRN

## 2020-07-20 MED ORDER — LIDOCAINE 2% (20 MG/ML) 5 ML SYRINGE
INTRAMUSCULAR | Status: DC | PRN
  Administered 2020-07-20: 20 mg via INTRAVENOUS

## 2020-07-20 NOTE — Telephone Encounter (Signed)
-----   Message from Deboraha Sprang, MD sent at 07/18/2020  7:04 PM EDT ----- Please Inform Patient  Labs are normal x mild renal  dysfunction  Thanks

## 2020-07-20 NOTE — Discharge Instructions (Signed)

## 2020-07-20 NOTE — Telephone Encounter (Signed)
Spoke with pt and advised per Dr Caryl Comes labs are normal with exception of mild renal dysfunction.  Pt verbalizes understanding and thanked Therapist, sports for call.

## 2020-07-20 NOTE — CV Procedure (Signed)
Procedure:   DCCV  Indication:  Symptomatic atrial atrial fibrillation  Procedure Note:  The patient signed informed consent.  They have had had therapeutic anticoagulation with apixaban greater than 3 weeks.  Anesthesia was administered by Dr. Glennon Mac.  Patient received 20 mg IV lidocaine and 80 mg IV propofol.Adequate airway was maintained throughout and vital followed per protocol.  They were cardioverted x 1 with 120J of biphasic synchronized energy.  They converted to NSR.  There were no apparent complications.  The patient had normal neuro status and respiratory status post procedure with vitals stable as recorded elsewhere.    Follow up:  They will continue on current medical therapy and follow up with cardiology as scheduled.  Buford Dresser, MD PhD 07/20/2020 10:15 AM

## 2020-07-20 NOTE — H&P (Signed)
History and Physical Interval Note:  EP consult note reviewed from Dr. Caryl Comes dated 06/19/20. Patient denies any changes to history since that time. Physical exam unchanged as well.  [facility] is a 79 y.o. female has presented today for surgery, with the diagnosis of Atrial Fibrillation  The various methods of treatment have been discussed with the patient and family. After consideration of risks, benefits and other options for treatment, the patient has consented to  Procedure(s): CARDIOVERSION (N/A) as a surgical intervention .  The patient's history has been reviewed, patient examined, no change in status, stable for surgery.  I have reviewed the patient's chart and labs.  Questions were answered to the patient's satisfaction.     Buford Dresser, MD PhD 07/20/2020 9:51 AM

## 2020-07-20 NOTE — Anesthesia Preprocedure Evaluation (Addendum)
Anesthesia Evaluation  Patient identified by MRN, date of birth, ID band Patient awake    Reviewed: Allergy & Precautions, NPO status , Patient's Chart, lab work & pertinent test results  History of Anesthesia Complications Negative for: history of anesthetic complications  Airway Mallampati: II  TM Distance: >3 FB Neck ROM: Full    Dental  (+) Dental Advisory Given, Teeth Intact   Pulmonary neg pulmonary ROS,  07/17/2020 SARS coronavirus NEG   breath sounds clear to auscultation       Cardiovascular hypertension, Pt. on medications and Pt. on home beta blockers (-) angina+ dysrhythmias Atrial Fibrillation  Rhythm:Irregular Rate:Normal  04/2020 Stress: no ST segment deviation noted during stress. Study was not gated due to atrial fibrillation Defect 1: There is a small defect of mild severity present in the apex location. Findings consistent with prior myocardial infarction.  This is a low risk study 02/2020 ECHO: EF 60-65%, trivial MR   Neuro/Psych Anxiety negative neurological ROS     GI/Hepatic Neg liver ROS, GERD  Medicated and Controlled,  Endo/Other  negative endocrine ROS  Renal/GU negative Renal ROS     Musculoskeletal   Abdominal   Peds  Hematology eliquis   Anesthesia Other Findings H/o breast cancer  Reproductive/Obstetrics                            Anesthesia Physical Anesthesia Plan  ASA: III  Anesthesia Plan: General   Post-op Pain Management:    Induction: Intravenous  PONV Risk Score and Plan: 3 and Treatment may vary due to age or medical condition  Airway Management Planned: Natural Airway and Mask  Additional Equipment: None  Intra-op Plan:   Post-operative Plan:   Informed Consent: I have reviewed the patients History and Physical, chart, labs and discussed the procedure including the risks, benefits and alternatives for the proposed anesthesia with the  patient or authorized representative who has indicated his/her understanding and acceptance.     Dental advisory given  Plan Discussed with: CRNA and Surgeon  Anesthesia Plan Comments:        Anesthesia Quick Evaluation

## 2020-07-20 NOTE — Anesthesia Postprocedure Evaluation (Signed)
Anesthesia Post Note  Patient: [facility]  Procedure(s) Performed: CARDIOVERSION (N/A )     Patient location during evaluation: Endoscopy Anesthesia Type: General Level of consciousness: awake and alert, patient cooperative and oriented Pain management: pain level controlled Vital Signs Assessment: post-procedure vital signs reviewed and stable Respiratory status: spontaneous breathing, nonlabored ventilation and respiratory function stable Cardiovascular status: blood pressure returned to baseline and stable Postop Assessment: no apparent nausea or vomiting Anesthetic complications: no   No complications documented.  Last Vitals:  Vitals:   07/20/20 1016 07/20/20 1030  BP: (!) 132/51 (!) 163/79  Pulse: 65 73  Resp: (!) 32 16  Temp: 37.2 C   SpO2: 96% 96%    Last Pain:  Vitals:   07/20/20 1030  TempSrc:   PainSc: 0-No pain                 Taylor Levick,E. Marina Desire

## 2020-07-20 NOTE — Transfer of Care (Signed)
Immediate Anesthesia Transfer of Care Note  Patient: [facility]  Procedure(s) Performed: CARDIOVERSION (N/A )  Patient Location: Endoscopy Unit  Anesthesia Type:General  Level of Consciousness: drowsy  Airway & Oxygen Therapy: Patient Spontanous Breathing and Patient connected to nasal cannula oxygen  Post-op Assessment: Report given to RN and Post -op Vital signs reviewed and stable  Post vital signs: Reviewed and stable  Last Vitals:  Vitals Value Taken Time  BP 132/51 07/20/20 1016  Temp    Pulse 62 07/20/20 1017  Resp 32 07/20/20 1017  SpO2 94 % 07/20/20 1017  Vitals shown include unvalidated device data.  Last Pain:  Vitals:   07/20/20 0936  TempSrc: Oral  PainSc: 0-No pain         Complications: No complications documented.

## 2020-07-21 ENCOUNTER — Encounter: Payer: Self-pay | Admitting: Physician Assistant

## 2020-07-21 ENCOUNTER — Ambulatory Visit: Payer: Medicare Other | Admitting: Physician Assistant

## 2020-07-21 ENCOUNTER — Ambulatory Visit
Admission: RE | Admit: 2020-07-21 | Discharge: 2020-07-21 | Disposition: A | Discharge status: Home / Self Care | Payer: Medicare Other | Source: Ambulatory Visit | Attending: Physician Assistant | Admitting: Physician Assistant

## 2020-07-21 VITALS — BP 132/74 | HR 81 | Ht 62.0 in | Wt 161.8 lb

## 2020-07-21 DIAGNOSIS — R0789 Other chest pain: Secondary | ICD-10-CM

## 2020-07-21 DIAGNOSIS — I480 Paroxysmal atrial fibrillation: Secondary | ICD-10-CM

## 2020-07-21 DIAGNOSIS — R7303 Prediabetes: Secondary | ICD-10-CM

## 2020-07-21 DIAGNOSIS — E785 Hyperlipidemia, unspecified: Secondary | ICD-10-CM | POA: Diagnosis not present

## 2020-07-21 DIAGNOSIS — R0602 Shortness of breath: Secondary | ICD-10-CM

## 2020-07-21 NOTE — Patient Instructions (Addendum)
Medication Instructions:   TAKE a half tablet of Lasix when you get home  *If you need a refill on your cardiac medications before your next appointment, please call your pharmacy*  Lab Work: Your physician recommends that you return for lab work TODAY:   ProBNP  If you have labs (blood work) drawn today and your tests are completely normal, you will receive your results only by: Marland Kitchen MyChart Message (if you have MyChart) OR . A paper copy in the mail If you have any lab test that is abnormal or we need to change your treatment, we will call you to review the results.  Testing/Procedures: A chest x-ray takes a picture of the organs and structures inside the chest, including the heart, lungs, and blood vessels. This test can show several things, including, whether the heart is enlarges; whether fluid is building up in the lungs; and whether pacemaker / defibrillator leads are still in place. This test is done at [facility] [address]  Follow-Up: At [facility], you and your health needs are our priority.  As part of our continuing mission to provide you with exceptional heart care, we have created designated Provider Care Teams.  These Care Teams include your primary Cardiologist (physician) and Advanced Practice Providers (APPs -  Physician Assistants and Nurse Practitioners) who all work together to provide you with the care you need, when you need it.  Your next appointment:   1 week(s)  The format for your next appointment:   In Person  Provider:   Almyra Deforest, PA-C  Other Instructions

## 2020-07-21 NOTE — Progress Notes (Signed)
Cardiology Office Note:    Date:  07/23/2020   ID:  Leslie Bryant, Leslie Bryant [birth year], MRN [id number]  PCP:  Biagio Borg, MD  [facility] Cardiologist:  Pixie Casino, MD  [facility] Electrophysiologist:  None   Referring MD: Biagio Borg, MD   Chief Complaint  Patient presents with  . Follow-up    seen for Dr. Debara Pickett    History of Present Illness:    Leslie Bryant is a 79 y.o. female with a hx of PVCs, hyperlipidemia, prediabetes and history of atrial fibrillation. Patient had a cardiac catheterization in 2002 that showed normal coronary arteries. She was placed on metoprolol in the past for PVCs. Patient was seen in the ED for shortness of breath and mild nonproductive cough. She was found to have mild interstitial edema on chest x-ray and mildly elevated BNP. She was placed on Lasix 40 mg daily. Echocardiogram obtained on 02/07/2020 showed EF 60 to 63%, diastolic function was unable to be assessed, normal pulmonary artery pressure. Subsequent heart monitor demonstrated atrial fibrillation and patient was referred to the A. fib clinic. She was placed on Eliquis, heart rate was controlled while in atrial fibrillation. She eventually underwent planned DC cardioversion after anticoagulated for 3 weeks. DCCV was performed by Dr. Wilhelmenia Blase 03/24/2019.  Unfortunately she went back into atrial fibrillation by the time she was followed up in June 2021.  Heart rate was controlled and the blood pressure was borderline.  I discussed with Dr. Debara Pickett at the time and we recommend proceeding with Lexiscan Myoview to make sure she does not have any ischemia before starting her on either Rythmol or flecainide.  Once she is started on antiarrhythmic therapy, then we will consider a repeat cardioversion.  Myoview performed on 04/25/2020 showed a small defect of mild severity present in the apex location which could be previous infarct versus artifact, overall low risk study, this study was not gated  due to atrial fibrillation.  Since there was a contraindication with class Ic antiarrhythmic therapy with possibility of previous infarct, we did refer the patient to EP service.  Patient was seen by EP service Dr. Caryl Comes who started her on amiodarone therapy.  She eventually underwent cardioversion on 07/20/2020 and converted via a single shock to sinus rhythm.  Patient presents today for evaluation.  She says last night while laying in bed, she felt some gurgling sensation near her heart.  This is a new symptom she has never experienced before.  Symptom is not accompanied any other issues.  She woke up this morning and went back to bed shortly after, the gurgling sensation has resolved.  She had some chest pain today as well, this is chronic for her.  Her chest pain does not have clear correlation with exertion, it typically occurs at rest and improves with activity.  Recent Myoview was reassuring.  I recommended continue observation of her current symptoms.  She does feel more short of breath recently.  She does not appears to be volume overloaded on physical exam, however she has prominent crackles in bilateral bases of her lungs.  I did instruct her to take an additional half a tablet of her Lasix once she get home today.  Differential diagnosis include atelectasis, pulmonary edema or pulmonary fibrosis.  I recommended a chest x-ray to make sure there is no sign of pulmonary fibrosis.  We may need to follow-up on this with a CT of the chest depend on the result of the chest  x-ray.  I also recommended a proBNP as well.  Past Medical History:  Diagnosis Date  . Acute meniscal tear of knee LEFT KNEE  . Allergic rhinitis   . Allergy   . Arthritis    "knees" (02/19/2017)  . Atrial fibrillation ([place])   . Breast cancer, right breast ([place]) 1986  . 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 Diverticulitis of large intestine with perforation 11/02/2011   Microperforation probably related to nut and popcorn  consumption.  Resolved by CT scan Recurrent episode clinical dx 07/2012   . Diverticulosis of colon    sigmoid  . GERD (gastroesophageal reflux disease)   . H/O hiatal hernia   . History of kidney stones    "passed it" (02/19/2017)  . Hypercholesterolemia    history  . Left knee pain    occasionally  . Palpitations IRREGULAR HEARTBEAT--  CONTROLLED W/  BETA BLOCKER  . Pneumonia 2017   "walking pneumonia" (02/19/2017)  . Swelling of left knee joint     Past Surgical History:  Procedure Laterality Date  . BREAST BIOPSY Right 1986  . BREAST BIOPSY Left   . BREAST RECONSTRUCTION Right 1987   W/ IMPLANTS  . CARDIAC CATHETERIZATION  12-22-2000  DR BRACKBILL   NORMAL LVF/ NORMAL CORONARY ARTERIES  . CARDIOVERSION N/A 03/23/2020   Procedure: CARDIOVERSION;  Surgeon: Donato Heinz, MD;  Location: [facility] ENDOSCOPY;  Service: Cardiovascular;  Laterality: N/A;  . CARDIOVERSION N/A 07/20/2020   Procedure: CARDIOVERSION;  Surgeon: Buford Dresser, MD;  Location: [place];  Service: Cardiovascular;  Laterality: N/A;  . CHOLECYSTECTOMY OPEN  1988  . CHONDROPLASTY  09/08/2012   Procedure: CHONDROPLASTY;  Surgeon: Tobi Bastos, MD;  Location: [facility];  Service: Orthopedics;  Laterality: Left;  . COLONOSCOPY     last colon 05-03-2009 (02/19/2017)  . COLONOSCOPY W/ BIOPSIES AND POLYPECTOMY    . KNEE ARTHROSCOPY WITH MEDIAL MENISECTOMY  09/08/2012   Procedure: KNEE ARTHROSCOPY WITH MEDIAL MENISECTOMY;  Surgeon: Tobi Bastos, MD;  Location: [place];  Service: Orthopedics;  Laterality: Left;  lateral tibial plateau,   . MASTECTOMY Right 1986   W/ MULTIPLE NODE DISSECTION  . REDUCTION MAMMAPLASTY Left 1987  . TOTAL KNEE ARTHROPLASTY Left 07/21/2013   Procedure: TOTAL KNEE ARTHROPLASTY;  Surgeon: Newt Minion, MD;  Location: [place];  Service: Orthopedics;  Laterality: Left;  Left Total Knee Arthroplasty  . TOTAL KNEE ARTHROPLASTY Right 02/19/2017     Procedure: RIGHT TOTAL KNEE ARTHROPLASTY;  Surgeon: Newt Minion, MD;  Location: [place];  Service: Orthopedics;  Laterality: Right;  Marland Kitchen VAGINAL HYSTERECTOMY  1966    Current Medications: Current Meds  Medication Sig  . amiodarone (PACERONE) 200 MG tablet Take 1 tablet (200 mg total) by mouth daily.  Marland Kitchen apixaban (ELIQUIS) 5 MG TABS tablet Take 1 tablet (5 mg total) by mouth 2 (two) times daily.  . calcium carbonate (OS-CAL - DOSED IN MG OF ELEMENTAL CALCIUM) 1250 (500 Ca) MG tablet Take 1 tablet by mouth daily with breakfast.  . co-enzyme Q-10 30 MG capsule Take 30 mg by mouth 2 (two) times daily.   Marland Kitchen ezetimibe (ZETIA) 10 MG tablet Take 10 mg by mouth daily.  . furosemide (LASIX) 40 MG tablet Take 1 tablet (40 mg total) by mouth daily.  . hyoscyamine (LEVSIN SL) 0.125 MG SL tablet DISSOLVE 1 TABLET IN MOUTH THREE TIMES DAILY AS NEEDED FOR  ABDOMINAL  CRAMPS (Patient taking differently: Take  0.125 mg by mouth daily as needed for cramping. )  . metoprolol tartrate (LOPRESSOR) 50 MG tablet Take 1.5 tablets (75 mg total) by mouth 2 (two) times daily.  . Multiple Vitamins-Minerals (CENTRUM SILVER PO) Take 1 tablet by mouth every morning. Align  . omeprazole (PRILOSEC) 40 MG capsule Take 40 mg by mouth daily.  . [DISCONTINUED] famotidine (PEPCID) 40 MG tablet Take 1 tablet (40 mg total) by mouth at bedtime.     Allergies:   Crestor [rosuvastatin calcium], Lipitor [atorvastatin calcium], Pravachol [pravastatin], and Penicillins   Social History   Socioeconomic History  . Marital status: Married    Spouse name: Not on file  . Number of children: 2  . Years of education: Not on file  . Highest education level: Not on file  Occupational History  . Occupation: Retired  Tobacco Use  . Smoking status: Never Smoker  . Smokeless tobacco: Never Used  Vaping Use  . Vaping Use: Never used  Substance and Sexual Activity  . Alcohol use: Yes    Alcohol/week: 1.0 standard drink    Types: 1 Glasses  of wine per week    Comment: social wine once a month  . Drug use: No  . Sexual activity: Yes  Other Topics Concern  . Not on file  Social History Narrative   She is married and retired and has 2 grown children   1 glass of wine a month no tobacco no drug use   Social Determinants of Radio broadcast assistant Strain:   . Difficulty of Paying Living Expenses: Not on file  Food Insecurity:   . Worried About Charity fundraiser in the Last Year: Not on file  . Ran Out of Food in the Last Year: Not on file  Transportation Needs:   . Lack of Transportation (Medical): Not on file  . Lack of Transportation (Non-Medical): Not on file  Physical Activity:   . Days of Exercise per Week: Not on file  . Minutes of Exercise per Session: Not on file  Stress:   . Feeling of Stress : Not on file  Social Connections:   . Frequency of Communication with Friends and Family: Not on file  . Frequency of Social Gatherings with Friends and Family: Not on file  . Attends Religious Services: Not on file  . Active Member of Clubs or Organizations: Not on file  . Attends Archivist Meetings: Not on file  . Marital Status: Not on file     Family History: The patient's family history includes Brain cancer in her sister; Cancer in her brother, sister, and sister; Colon cancer in her brother; Dementia in her maternal grandfather and sister; Esophageal cancer in her brother; Heart attack in her father; Heart disease in her daughter; Heart failure in her mother; Leukemia in her brother; Lung cancer in her sister; Rectal cancer in her brother. There is no history of Stomach cancer.  ROS:   Please see the history of present illness.     All other systems reviewed and are negative.  EKGs/Labs/Other Studies Reviewed:    The following studies were reviewed today:  Echo 02/07/2020 1. Left ventricular ejection fraction, by estimation, is 60 to 65%. The  left ventricle has normal function. The left  ventricle has no regional  wall motion abnormalities. Left ventricular diastolic function could not  be evaluated.  2. Right ventricular systolic function is normal. The right ventricular  size is normal. There is normal pulmonary  artery systolic pressure.  3. The mitral valve is normal in structure. Trivial mitral valve  regurgitation. No evidence of mitral stenosis.  4. The aortic valve is grossly normal. Aortic valve regurgitation is  trivial. No aortic stenosis is present.   EKG:  EKG is ordered today.  The ekg ordered today demonstrates normal sinus rhythm, first-degree AV block.  Recent Labs: 02/03/2020: B Natriuretic Peptide 156.1 05/03/2020: TSH 1.30 07/01/2020: ALT 27 07/17/2020: BUN 15; Creatinine, Ser 1.22; Hemoglobin 11.8; Platelets 275; Potassium 3.8; Sodium 138 07/21/2020: NT-Pro BNP 1,519  Recent Lipid Panel    Component Value Date/Time   CHOL 163 05/03/2020 1417   TRIG 228.0 (H) 05/03/2020 1417   HDL 43.70 05/03/2020 1417   CHOLHDL 4 05/03/2020 1417   VLDL 45.6 (H) 05/03/2020 1417   LDLCALC 116 (H) 11/09/2019 1109   LDLDIRECT 104.0 05/03/2020 1417    Physical Exam:    VS:  BP 132/74   Pulse 81   Ht 5\' 2"  (1.575 m)   Wt 161 lb 12.8 oz (73.4 kg)   SpO2 97%   BMI 29.59 kg/m     Wt Readings from Last 3 Encounters:  07/21/20 161 lb 12.8 oz (73.4 kg)  07/20/20 160 lb (72.6 kg)  07/01/20 160 lb (72.6 kg)     GEN:  Well nourished, well developed in no acute distress HEENT: Normal NECK: No JVD; No carotid bruits LYMPHATICS: No lymphadenopathy CARDIAC: RRR, no murmurs, rubs, gallops RESPIRATORY: Bibasilar crackles ABDOMEN: Soft, non-tender, non-distended MUSCULOSKELETAL:  No edema; No deformity  SKIN: Warm and dry NEUROLOGIC:  Alert and oriented x 3 PSYCHIATRIC:  Normal affect   ASSESSMENT:    1. Atypical chest pain   2. SOB (shortness of breath)   3. PAF (paroxysmal atrial fibrillation) ([place])   4. Hyperlipidemia LDL goal <70   5. Prediabetes     PLAN:    In order of problems listed above:  1. Atypical chest pain: Symptom does not have clear correlation with the degree of exertion.  I recommended continue monitoring  2. Dyspnea: Her dyspnea has not improved with the cardioversion and restoration of sinus rhythm.  On physical exam, she has bibasilar crackles.  I recommended a BMP and a chest x-ray.  I did instruct her to take extra half a tablet of the diuretic once she get home today.  On exam, she does not appears to be significantly volume overloaded.  3. PAF: Recently started on amiodarone therapy and underwent successful cardioversion yesterday.  However given the bibasilar crackles I heard on physical exam, will need to rule out pulmonary fibrosis.  Continue Eliquis  4. Hyperlipidemia: She is currently not on statin therapy.  Triglycerides elevated on last lab work in June 2021.  Continue diet and exercise  5. Prediabetes: Managed by primary care provider.     Medication Adjustments/Labs and Tests Ordered: Current medicines are reviewed at length with the patient today.  Concerns regarding medicines are outlined above.  Orders Placed This Encounter  Procedures  . DG Chest 2 View  . Pro b natriuretic peptide (BNP)  . EKG 12-Lead   No orders of the defined types were placed in this encounter.   Patient Instructions  Medication Instructions:   TAKE a half tablet of Lasix when you get home  *If you need a refill on your cardiac medications before your next appointment, please call your pharmacy*  Lab Work: Your physician recommends that you return for lab work TODAY:   ProBNP  If you  have labs (blood work) drawn today and your tests are completely normal, you will receive your results only by: Marland Kitchen MyChart Message (if you have MyChart) OR . A paper copy in the mail If you have any lab test that is abnormal or we need to change your treatment, we will call you to review the results.  Testing/Procedures: A chest  x-ray takes a picture of the organs and structures inside the chest, including the heart, lungs, and blood vessels. This test can show several things, including, whether the heart is enlarges; whether fluid is building up in the lungs; and whether pacemaker / defibrillator leads are still in place. This test is done at [facility] [address]  Follow-Up: At [facility], you and your health needs are our priority.  As part of our continuing mission to provide you with exceptional heart care, we have created designated Provider Care Teams.  These Care Teams include your primary Cardiologist (physician) and Advanced Practice Providers (APPs -  Physician Assistants and Nurse Practitioners) who all work together to provide you with the care you need, when you need it.  Your next appointment:   1 week(s)  The format for your next appointment:   In Person  Provider:   Almyra Deforest, PA-C  Other Instructions      Signed, Almyra Deforest, [place]  07/23/2020 10:59 PM    [place]

## 2020-07-22 LAB — PRO B NATRIURETIC PEPTIDE: NT-Pro BNP: 1519 pg/mL — ABNORMAL HIGH (ref 0–738)

## 2020-07-23 ENCOUNTER — Encounter: Payer: Self-pay | Admitting: Physician Assistant

## 2020-07-24 ENCOUNTER — Ambulatory Visit: Payer: Medicare Other | Admitting: Physician Assistant

## 2020-07-25 ENCOUNTER — Ambulatory Visit (HOSPITAL_COMMUNITY)
Admission: RE | Admit: 2020-07-25 | Discharge: 2020-07-25 | Disposition: A | Discharge status: Home / Self Care | Payer: Medicare Other | Source: Ambulatory Visit | Attending: Physician Assistant | Admitting: Physician Assistant

## 2020-07-25 ENCOUNTER — Other Ambulatory Visit: Payer: Self-pay

## 2020-07-25 VITALS — BP 136/76 | HR 75 | Ht 62.0 in | Wt 160.6 lb

## 2020-07-25 DIAGNOSIS — I10 Essential (primary) hypertension: Secondary | ICD-10-CM | POA: Insufficient documentation

## 2020-07-25 DIAGNOSIS — D6869 Other thrombophilia: Secondary | ICD-10-CM | POA: Insufficient documentation

## 2020-07-25 DIAGNOSIS — Z7901 Long term (current) use of anticoagulants: Secondary | ICD-10-CM | POA: Diagnosis not present

## 2020-07-25 DIAGNOSIS — K219 Gastro-esophageal reflux disease without esophagitis: Secondary | ICD-10-CM | POA: Insufficient documentation

## 2020-07-25 DIAGNOSIS — I4819 Other persistent atrial fibrillation: Secondary | ICD-10-CM

## 2020-07-25 DIAGNOSIS — Z79899 Other long term (current) drug therapy: Secondary | ICD-10-CM | POA: Insufficient documentation

## 2020-07-25 DIAGNOSIS — I471 Supraventricular tachycardia: Secondary | ICD-10-CM | POA: Insufficient documentation

## 2020-07-25 LAB — COMPREHENSIVE METABOLIC PANEL
ALT: 44 U/L (ref 0–44)
AST: 46 U/L — ABNORMAL HIGH (ref 15–41)
Albumin: 3.8 g/dL (ref 3.5–5.0)
Alkaline Phosphatase: 57 U/L (ref 38–126)
Anion gap: 11 (ref 5–15)
BUN: 19 mg/dL (ref 8–23)
CO2: 26 mmol/L (ref 22–32)
Calcium: 9.5 mg/dL (ref 8.9–10.3)
Chloride: 99 mmol/L (ref 98–111)
Creatinine, Ser: 1.07 mg/dL — ABNORMAL HIGH (ref 0.44–1.00)
GFR calc Af Amer: 57 mL/min — ABNORMAL LOW (ref >60)
GFR calc non Af Amer: 49 mL/min — ABNORMAL LOW (ref >60)
Glucose, Bld: 106 mg/dL — ABNORMAL HIGH (ref 70–99)
Potassium: 4.1 mmol/L (ref 3.5–5.1)
Sodium: 136 mmol/L (ref 135–145)
Total Bilirubin: 0.6 mg/dL (ref 0.3–1.2)
Total Protein: 7.8 g/dL (ref 6.5–8.1)

## 2020-07-25 LAB — TSH: TSH: 2.202 u[IU]/mL (ref 0.350–4.500)

## 2020-07-25 NOTE — Progress Notes (Signed)
Primary Care Physician: Biagio Borg, MD Primary Cardiologist: Dr Debara Pickett Primary Electrophysiologist: Dr Caryl Comes Referring Physician: Dr Rayne Du Leslie Bryant is a 79 y.o. female with a history of HLD, HTN, and new onset paroxysmal atrial fibrillation who presents for follow up in the [facility].  Patient was seen at the ED on 02/03/20 for increased SOB. She had mildly elevated BNP and some interstitial edema on CXR. She was started on Lasix with some improvement. On follow up with Dr Debara Pickett, she had an echocardiogram which showed EF 45-50% with frequent ectopy. A Zio patch was ordered which showed afib on 02/10/20. She was started on Eliquis for a CHADS2VASC score of 4. She denies alcohol use. She does admit to drinking 3-4 cups of coffee daily.   On follow up today, patient underwent DCCV on 03/23/20 but had quick return of her afib. She was seen by Dr Caryl Comes who recommended amiodarone. She is s/p repeat DCCV on 07/20/20. She reports she feels much better in SR with much less fatigue. She did have some SOB the day after DCCV, CXR showed no acute process but her proBNP was elevated at 1500. She took two extra 1/2 Lasix doses which improved her symptoms. She had an afib episode yesterday which lasted most of the day but converted back to SR overnight.   Today, she denies symptoms of chest pain, orthopnea, PND, lower extremity edema, dizziness, presyncope, syncope, snoring, daytime somnolence, bleeding, or neurologic sequela. The patient is tolerating medications without difficulties and is otherwise without complaint today.    Atrial Fibrillation Risk Factors:  she does have symptoms or diagnosis of sleep apnea. she does not have a history of rheumatic fever. she does not have a history of alcohol use. The patient does not have a history of early familial atrial fibrillation or other arrhythmias.  she has a BMI of Body mass index is 29.37 kg/m.Marland Kitchen Filed Weights   07/25/20  1040  Weight: 72.8 kg    Family History  Problem Relation Age of Onset  . Heart attack Father   . Heart failure Mother        CHF  . Dementia Maternal Grandfather   . Dementia Sister   . Colon cancer Brother        died/age 42  . Esophageal cancer Brother   . Lung cancer Sister   . Brain cancer Sister   . Cancer Brother        mouth  . Rectal cancer Brother   . Leukemia Brother   . Cancer Sister        type unknown  . Cancer Sister        type unknown  . Heart disease Daughter   . Stomach cancer Neg Hx      Atrial Fibrillation Management history:  Previous antiarrhythmic drugs: amiodarone  Previous cardioversions: 03/23/20, 07/20/20 Previous ablations: none CHADS2VASC score: 4 Anticoagulation history: Eliquis   Past Medical History:  Diagnosis Date  . Acute meniscal tear of knee LEFT KNEE  . Allergic rhinitis   . Allergy   . Arthritis    "knees" (02/19/2017)  . Atrial fibrillation ([place])   . Breast cancer, right breast ([place]) 1986  . 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 Diverticulitis of large intestine with perforation 11/02/2011   Microperforation probably related to nut and popcorn consumption.  Resolved by CT scan Recurrent episode clinical dx 07/2012   . Diverticulosis of colon  sigmoid  . GERD (gastroesophageal reflux disease)   . H/O hiatal hernia   . History of kidney stones    "passed it" (02/19/2017)  . Hypercholesterolemia    history  . Left knee pain    occasionally  . Palpitations IRREGULAR HEARTBEAT--  CONTROLLED W/  BETA BLOCKER  . Pneumonia 2017   "walking pneumonia" (02/19/2017)  . Swelling of left knee joint    Past Surgical History:  Procedure Laterality Date  . BREAST BIOPSY Right 1986  . BREAST BIOPSY Left   . BREAST RECONSTRUCTION Right 1987   W/ IMPLANTS  . CARDIAC CATHETERIZATION  12-22-2000  DR BRACKBILL   NORMAL LVF/ NORMAL CORONARY ARTERIES  . CARDIOVERSION N/A 03/23/2020   Procedure: CARDIOVERSION;  Surgeon: Donato Heinz, MD;  Location: [facility] ENDOSCOPY;  Service: Cardiovascular;  Laterality: N/A;  . CARDIOVERSION N/A 07/20/2020   Procedure: CARDIOVERSION;  Surgeon: Buford Dresser, MD;  Location: [place];  Service: Cardiovascular;  Laterality: N/A;  . CHOLECYSTECTOMY OPEN  1988  . CHONDROPLASTY  09/08/2012   Procedure: CHONDROPLASTY;  Surgeon: Tobi Bastos, MD;  Location: [facility];  Service: Orthopedics;  Laterality: Left;  . COLONOSCOPY     last colon 05-03-2009 (02/19/2017)  . COLONOSCOPY W/ BIOPSIES AND POLYPECTOMY    . KNEE ARTHROSCOPY WITH MEDIAL MENISECTOMY  09/08/2012   Procedure: KNEE ARTHROSCOPY WITH MEDIAL MENISECTOMY;  Surgeon: Tobi Bastos, MD;  Location: [place];  Service: Orthopedics;  Laterality: Left;  lateral tibial plateau,   . MASTECTOMY Right 1986   W/ MULTIPLE NODE DISSECTION  . REDUCTION MAMMAPLASTY Left 1987  . TOTAL KNEE ARTHROPLASTY Left 07/21/2013   Procedure: TOTAL KNEE ARTHROPLASTY;  Surgeon: Newt Minion, MD;  Location: [place];  Service: Orthopedics;  Laterality: Left;  Left Total Knee Arthroplasty  . TOTAL KNEE ARTHROPLASTY Right 02/19/2017   Procedure: RIGHT TOTAL KNEE ARTHROPLASTY;  Surgeon: Newt Minion, MD;  Location: [facility];  Service: Orthopedics;  Laterality: Right;  Marland Kitchen VAGINAL HYSTERECTOMY  1966    Current Outpatient Medications  Medication Sig Dispense Refill  . amiodarone (PACERONE) 200 MG tablet Take 1 tablet (200 mg total) by mouth daily. 90 tablet 3  . apixaban (ELIQUIS) 5 MG TABS tablet Take 1 tablet (5 mg total) by mouth 2 (two) times daily. 60 tablet 11  . calcium carbonate (OS-CAL - DOSED IN MG OF ELEMENTAL CALCIUM) 1250 (500 Ca) MG tablet Take 1 tablet by mouth daily with breakfast.    . co-enzyme Q-10 30 MG capsule Take 30 mg by mouth 2 (two) times daily.     Marland Kitchen ezetimibe (ZETIA) 10 MG tablet Take 10 mg by mouth daily.    . furosemide (LASIX) 40 MG tablet Take 1 tablet (40 mg total) by mouth daily.  90 tablet 3  . hyoscyamine (LEVSIN SL) 0.125 MG SL tablet DISSOLVE 1 TABLET IN MOUTH THREE TIMES DAILY AS NEEDED FOR  ABDOMINAL  CRAMPS (Patient taking differently: Take 0.125 mg by mouth daily as needed for cramping. ) 30 tablet 0  . metoprolol tartrate (LOPRESSOR) 50 MG tablet Take 1.5 tablets (75 mg total) by mouth 2 (two) times daily. 180 tablet 1  . Multiple Vitamins-Minerals (CENTRUM SILVER PO) Take 1 tablet by mouth every morning. Align    . omeprazole (PRILOSEC) 40 MG capsule Take 40 mg by mouth daily.     No current facility-administered medications for this encounter.    Allergies  Allergen Reactions  . Crestor [Rosuvastatin Calcium] Other (See  Comments)    Shoulder pain  . Lipitor [Atorvastatin Calcium] Other (See Comments)    Myalgias   . Pravachol [Pravastatin] Other (See Comments)    myalgias  . Penicillins Itching    Has patient had a PCN reaction causing immediate rash, facial/tongue/throat swelling, SOB or lightheadedness with hypotension: No Has patient had a PCN reaction causing severe rash involving mucus membranes or skin necrosis: No Has patient had a PCN reaction that required hospitalization No Has patient had a PCN reaction occurring within the last 10 years: No If all of the above answers are "NO", then may proceed with Cephalosporin use.     Social History   Socioeconomic History  . Marital status: Married    Spouse name: Not on file  . Number of children: 2  . Years of education: Not on file  . Highest education level: Not on file  Occupational History  . Occupation: Retired  Tobacco Use  . Smoking status: Never Smoker  . Smokeless tobacco: Never Used  Vaping Use  . Vaping Use: Never used  Substance and Sexual Activity  . Alcohol use: Yes    Alcohol/week: 1.0 standard drink    Types: 1 Glasses of wine per week    Comment: social wine once a month  . Drug use: No  . Sexual activity: Yes  Other Topics Concern  . Not on file  Social History  Narrative   She is married and retired and has 2 grown children   1 glass of wine a month no tobacco no drug use   Social Determinants of Radio broadcast assistant Strain:   . Difficulty of Paying Living Expenses: Not on file  Food Insecurity:   . Worried About Charity fundraiser in the Last Year: Not on file  . Ran Out of Food in the Last Year: Not on file  Transportation Needs:   . Lack of Transportation (Medical): Not on file  . Lack of Transportation (Non-Medical): Not on file  Physical Activity:   . Days of Exercise per Week: Not on file  . Minutes of Exercise per Session: Not on file  Stress:   . Feeling of Stress : Not on file  Social Connections:   . Frequency of Communication with Friends and Family: Not on file  . Frequency of Social Gatherings with Friends and Family: Not on file  . Attends Religious Services: Not on file  . Active Member of Clubs or Organizations: Not on file  . Attends Archivist Meetings: Not on file  . Marital Status: Not on file  Intimate Partner Violence:   . Fear of Current or Ex-Partner: Not on file  . Emotionally Abused: Not on file  . Physically Abused: Not on file  . Sexually Abused: Not on file     ROS- All systems are reviewed and negative except as per the HPI above.  Physical Exam: Vitals:   07/25/20 1040  BP: 136/76  Pulse: 75  Weight: 72.8 kg  Height: 5\' 2"  (1.575 m)    GEN- The patient is well appearing elderly female, alert and oriented x 3 today.   HEENT-head normocephalic, atraumatic, sclera clear, conjunctiva pink, hearing intact, trachea midline. Lungs- Clear to ausculation bilaterally, normal work of breathing Heart- Regular rate and rhythm, no murmurs, rubs or gallops  GI- soft, NT, ND, + BS Extremities- no clubbing, cyanosis, or edema MS- no significant deformity or atrophy Skin- no rash or lesion Psych- euthymic mood, full affect  Neuro- strength and sensation are intact   Wt Readings from Last  3 Encounters:  07/25/20 72.8 kg  07/21/20 73.4 kg  07/20/20 72.6 kg    EKG today demonstrates SR HR 75, 1st degree AV block, PR 236, QRS 70, QTc 489  Echo 02/07/20 demonstrated  1. Left ventricular ejection fraction, by estimation, is 60 to 65%. The  left ventricle has normal function. The left ventricle has no regional  wall motion abnormalities. Left ventricular diastolic function could not  be evaluated.  2. Right ventricular systolic function is normal. The right ventricular  size is normal. There is normal pulmonary artery systolic pressure.  3. The mitral valve is normal in structure. Trivial mitral valve  regurgitation. No evidence of mitral stenosis.  4. The aortic valve is grossly normal. Aortic valve regurgitation is  trivial. No aortic stenosis is present.   (EF on phone note 4/7 felt to be around 45-50%)  Epic records are reviewed at length today  CHA2DS2-VASc Score = 4  The patient's score is based upon: CHF History: 0 HTN History: 1 Age : 2 Diabetes History: 0 Stroke History: 0 Vascular Disease History: 0 Gender: 1      ASSESSMENT AND PLAN: 1. Persistent Atrial Fibrillation/PSVT The patient's CHA2DS2-VASc score is 4, indicating a 4.8% annual risk of stroke.   S/p DCCV on 07/20/20 Continue amiodarone 200 mg daily. Check cmet/TSH today. Continue Eliquis 5 mg BID Continue Lopressor 75 mg BID Plans for sleep study noted.  2. Secondary Hypercoagulable State (ICD10:  D68.69) The patient is at significant risk for stroke/thromboembolism based upon her CHA2DS2-VASc Score of 4.  Continue Apixaban (Eliquis).  3. HTN Stable, no changes today.   Follow up with Almyra Deforest, Dr Debara Pickett, and Dr Caryl Comes as scheduled.    [facility] [address] [place], [place] [address] [phone] 07/25/2020 11:27 AM

## 2020-07-27 ENCOUNTER — Other Ambulatory Visit: Payer: Self-pay | Admitting: Internal Medicine

## 2020-07-28 ENCOUNTER — Ambulatory Visit: Payer: Medicare Other | Admitting: Physician Assistant

## 2020-07-28 ENCOUNTER — Other Ambulatory Visit: Payer: Self-pay

## 2020-07-28 VITALS — BP 122/70 | HR 85 | Ht 62.0 in | Wt 162.4 lb

## 2020-07-28 DIAGNOSIS — R0600 Dyspnea, unspecified: Secondary | ICD-10-CM

## 2020-07-28 DIAGNOSIS — E785 Hyperlipidemia, unspecified: Secondary | ICD-10-CM

## 2020-07-28 DIAGNOSIS — I4819 Other persistent atrial fibrillation: Secondary | ICD-10-CM | POA: Diagnosis not present

## 2020-07-28 NOTE — Progress Notes (Signed)
Cardiology Office Note:    Date:  07/30/2020   ID:  Leslie Bryant, Leslie Bryant 04-20-1941, MRN [id number]  PCP:  Biagio Borg, MD  [facility] Cardiologist:  Pixie Casino, MD  [facility] Electrophysiologist:  None   Referring MD: Biagio Borg, MD   Chief Complaint  Patient presents with  . Follow-up    seen for Dr. Debara Pickett    History of Present Illness:    Leslie Bryant is a 79 y.o. female with a hx of PVCs, hyperlipidemia, prediabetes and history of atrial fibrillation. Patient had a cardiac catheterization in 2002 that showed normal coronary arteries. She was placed on metoprolol in the past for PVCs. Patient was seen in the ED for shortness of breath and mild nonproductive cough. She was found to have mild interstitial edema on chest x-ray and mildly elevated BNP. She was placed on Lasix 40 mg daily. Echocardiogram obtained on 02/07/2020 showed EF 60 to 62%, diastolic function was unable to be assessed, normal pulmonary artery pressure. Subsequent heart monitor demonstrated atrial fibrillation and patient was referred to the A. fib clinic. She was placed on Eliquis, heart rate was controlled while in atrial fibrillation. She eventually underwent planned DC cardioversion after anticoagulated for 3 weeks. DCCV was performed by Dr. Wilhelmenia Blase 03/24/2019.Unfortunately she went back into atrial fibrillation by the time she was followed up in June 2021. Heart rate was controlled and the blood pressure was borderline. I discussed with Dr. Debara Pickett at the time and we recommend proceeding with Lexiscan Myoview to make sure she does not have any ischemia before starting her on either Rythmol or flecainide. Once she is started on antiarrhythmic therapy, then we will consider a repeat cardioversion. Myoview performed on 04/25/2020 showed a small defect of mild severity present in the apex location which could be previous infarct versus artifact, overall low risk study, this study was not gated  due to atrial fibrillation.  Since there was a contraindication with class Ic antiarrhythmic therapy with possibility of previous infarct, we did refer the patient to EP service.  Patient was seen by EP service Dr. Caryl Comes who started her on amiodarone therapy.  She eventually underwent cardioversion on 07/20/2020 and converted via a single shock to sinus rhythm.   I last saw the patient on 07/21/2020 at which time she was complaining of chest pain and shortness of breath the day after the cardioversion.  On exam, she was near euvolemic level, however I recommended a proBNP and the extra half a tablet of diuretic.  According to the patient today, the diuretic did to seems to help with her shortness of breath.  On exam today, her lung is clear and she has no lower extremity edema or JVD.  I recommended continue on the current therapy.  She still has some degree of dyspnea on exertion, I am not sure if this is related to potential lung issue. Previous echocardiogram obtained in April 2021 did not reveal any significant pericardial effusion.  At this point, since the chest pain has not recurred, I recommended to continue observation.   Past Medical History:  Diagnosis Date  . Acute meniscal tear of knee LEFT KNEE  . Allergic rhinitis   . Allergy   . Arthritis    "knees" (02/19/2017)  . Atrial fibrillation ([place])   . Breast cancer, right breast ([place]) 1986  . 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 Diverticulitis of large intestine with perforation 11/02/2011   Microperforation  probably related to nut and popcorn consumption.  Resolved by CT scan Recurrent episode clinical dx 07/2012   . Diverticulosis of colon    sigmoid  . GERD (gastroesophageal reflux disease)   . H/O hiatal hernia   . History of kidney stones    "passed it" (02/19/2017)  . Hypercholesterolemia    history  . Left knee pain    occasionally  . Palpitations IRREGULAR HEARTBEAT--  CONTROLLED W/  BETA BLOCKER  . Pneumonia 2017   "walking  pneumonia" (02/19/2017)  . Swelling of left knee joint     Past Surgical History:  Procedure Laterality Date  . BREAST BIOPSY Right 1986  . BREAST BIOPSY Left   . BREAST RECONSTRUCTION Right 1987   W/ IMPLANTS  . CARDIAC CATHETERIZATION  12-22-2000  DR BRACKBILL   NORMAL LVF/ NORMAL CORONARY ARTERIES  . CARDIOVERSION N/A 03/23/2020   Procedure: CARDIOVERSION;  Surgeon: Donato Heinz, MD;  Location: [facility] ENDOSCOPY;  Service: Cardiovascular;  Laterality: N/A;  . CARDIOVERSION N/A 07/20/2020   Procedure: CARDIOVERSION;  Surgeon: Buford Dresser, MD;  Location: [place];  Service: Cardiovascular;  Laterality: N/A;  . CHOLECYSTECTOMY OPEN  1988  . CHONDROPLASTY  09/08/2012   Procedure: CHONDROPLASTY;  Surgeon: Tobi Bastos, MD;  Location: [facility];  Service: Orthopedics;  Laterality: Left;  . COLONOSCOPY     last colon 05-03-2009 (02/19/2017)  . COLONOSCOPY W/ BIOPSIES AND POLYPECTOMY    . KNEE ARTHROSCOPY WITH MEDIAL MENISECTOMY  09/08/2012   Procedure: KNEE ARTHROSCOPY WITH MEDIAL MENISECTOMY;  Surgeon: Tobi Bastos, MD;  Location: [place];  Service: Orthopedics;  Laterality: Left;  lateral tibial plateau,   . MASTECTOMY Right 1986   W/ MULTIPLE NODE DISSECTION  . REDUCTION MAMMAPLASTY Left 1987  . TOTAL KNEE ARTHROPLASTY Left 07/21/2013   Procedure: TOTAL KNEE ARTHROPLASTY;  Surgeon: Newt Minion, MD;  Location: [place];  Service: Orthopedics;  Laterality: Left;  Left Total Knee Arthroplasty  . TOTAL KNEE ARTHROPLASTY Right 02/19/2017   Procedure: RIGHT TOTAL KNEE ARTHROPLASTY;  Surgeon: Newt Minion, MD;  Location: [place];  Service: Orthopedics;  Laterality: Right;  Marland Kitchen VAGINAL HYSTERECTOMY  1966    Current Medications: Current Meds  Medication Sig  . amiodarone (PACERONE) 200 MG tablet Take 1 tablet (200 mg total) by mouth daily.  Marland Kitchen apixaban (ELIQUIS) 5 MG TABS tablet Take 1 tablet (5 mg total) by mouth 2 (two) times daily.  .  calcium carbonate (OS-CAL - DOSED IN MG OF ELEMENTAL CALCIUM) 1250 (500 Ca) MG tablet Take 1 tablet by mouth daily with breakfast.  . co-enzyme Q-10 30 MG capsule Take 30 mg by mouth 2 (two) times daily.   Marland Kitchen ezetimibe (ZETIA) 10 MG tablet Take 10 mg by mouth daily.  . furosemide (LASIX) 40 MG tablet Take 1 tablet (40 mg total) by mouth daily.  . hyoscyamine (LEVSIN SL) 0.125 MG SL tablet DISSOLVE 1 TABLET IN MOUTH THREE TIMES DAILY AS NEEDED FOR  ABDOMINAL  CRAMPS  . metoprolol tartrate (LOPRESSOR) 50 MG tablet Take 1.5 tablets (75 mg total) by mouth 2 (two) times daily.  . Multiple Vitamins-Minerals (CENTRUM SILVER PO) Take 1 tablet by mouth every morning. Align  . omeprazole (PRILOSEC) 40 MG capsule Take 40 mg by mouth daily.     Allergies:   Crestor [rosuvastatin calcium], Lipitor [atorvastatin calcium], Pravachol [pravastatin], and Penicillins   Social History   Socioeconomic History  . Marital status: Married    Spouse name: Not  on file  . Number of children: 2  . Years of education: Not on file  . Highest education level: Not on file  Occupational History  . Occupation: Retired  Tobacco Use  . Smoking status: Never Smoker  . Smokeless tobacco: Never Used  Vaping Use  . Vaping Use: Never used  Substance and Sexual Activity  . Alcohol use: Yes    Alcohol/week: 1.0 standard drink    Types: 1 Glasses of wine per week    Comment: social wine once a month  . Drug use: No  . Sexual activity: Yes  Other Topics Concern  . Not on file  Social History Narrative   She is married and retired and has 2 grown children   1 glass of wine a month no tobacco no drug use   Social Determinants of Radio broadcast assistant Strain:   . Difficulty of Paying Living Expenses: Not on file  Food Insecurity:   . Worried About Charity fundraiser in the Last Year: Not on file  . Ran Out of Food in the Last Year: Not on file  Transportation Needs:   . Lack of Transportation (Medical): Not on  file  . Lack of Transportation (Non-Medical): Not on file  Physical Activity:   . Days of Exercise per Week: Not on file  . Minutes of Exercise per Session: Not on file  Stress:   . Feeling of Stress : Not on file  Social Connections:   . Frequency of Communication with Friends and Family: Not on file  . Frequency of Social Gatherings with Friends and Family: Not on file  . Attends Religious Services: Not on file  . Active Member of Clubs or Organizations: Not on file  . Attends Archivist Meetings: Not on file  . Marital Status: Not on file     Family History: The patient's family history includes Brain cancer in her sister; Cancer in her brother, sister, and sister; Colon cancer in her brother; Dementia in her maternal grandfather and sister; Esophageal cancer in her brother; Heart attack in her father; Heart disease in her daughter; Heart failure in her mother; Leukemia in her brother; Lung cancer in her sister; Rectal cancer in her brother. There is no history of Stomach cancer.  ROS:   Please see the history of present illness.     All other systems reviewed and are negative.  EKGs/Labs/Other Studies Reviewed:    The following studies were reviewed today:  Echo 02/07/2020 IMPRESSIONS    1. Left ventricular ejection fraction, by estimation, is 60 to 65%. The  left ventricle has normal function. The left ventricle has no regional  wall motion abnormalities. Left ventricular diastolic function could not  be evaluated.  2. Right ventricular systolic function is normal. The right ventricular  size is normal. There is normal pulmonary artery systolic pressure.  3. The mitral valve is normal in structure. Trivial mitral valve  regurgitation. No evidence of mitral stenosis.  4. The aortic valve is grossly normal. Aortic valve regurgitation is  trivial. No aortic stenosis is present.   EKG:  EKG is not ordered today.    Recent Labs: 02/03/2020: B Natriuretic Peptide  156.1 07/17/2020: Hemoglobin 11.8; Platelets 275 07/21/2020: NT-Pro BNP 1,519 07/25/2020: ALT 44; BUN 19; Creatinine, Ser 1.07; Potassium 4.1; Sodium 136; TSH 2.202  Recent Lipid Panel    Component Value Date/Time   CHOL 163 05/03/2020 1417   TRIG 228.0 (H) 05/03/2020 1417  HDL 43.70 05/03/2020 1417   CHOLHDL 4 05/03/2020 1417   VLDL 45.6 (H) 05/03/2020 1417   LDLCALC 116 (H) 11/09/2019 1109   LDLDIRECT 104.0 05/03/2020 1417    Physical Exam:    VS:  BP 122/70   Pulse 85   Ht 5\' 2"  (1.575 m)   Wt 162 lb 6.4 oz (73.7 kg)   BMI 29.70 kg/m     Wt Readings from Last 3 Encounters:  07/28/20 162 lb 6.4 oz (73.7 kg)  07/25/20 160 lb 9.6 oz (72.8 kg)  07/21/20 161 lb 12.8 oz (73.4 kg)     GEN:  Well nourished, well developed in no acute distress HEENT: Normal NECK: No JVD; No carotid bruits LYMPHATICS: No lymphadenopathy CARDIAC: RRR, no murmurs, rubs, gallops RESPIRATORY:  Clear to auscultation without rales, wheezing or rhonchi  ABDOMEN: Soft, non-tender, non-distended MUSCULOSKELETAL:  No edema; No deformity  SKIN: Warm and dry NEUROLOGIC:  Alert and oriented x 3 PSYCHIATRIC:  Normal affect   ASSESSMENT:    1. Persistent atrial fibrillation ([place])   2. Dyspnea, unspecified type   3. Hyperlipidemia LDL goal <100    PLAN:    In order of problems listed above:  1. Persistent atrial fibrillation: Patient was recently started on amiodarone therapy and underwent cardioversion.  She is maintaining sinus rhythm based on physical exam today.  On Eliquis and metoprolol  2. Dyspnea: Suspicion for amiodarone induced pulmonary toxicity is fairly low.  I recommended continue observation at this time.  Previous chest discomfort the day after the cardioversion has completely resolved.  3. Hyperlipidemia: Continue Zetia   Medication Adjustments/Labs and Tests Ordered: Current medicines are reviewed at length with the patient today.  Concerns regarding medicines are outlined above.   No orders of the defined types were placed in this encounter.  No orders of the defined types were placed in this encounter.   Patient Instructions  Medication Instructions:  Your Physician recommend you continue on your current medication as directed.    *If you need a refill on your cardiac medications before your next appointment, please call your pharmacy*   Lab Work: None ordered   Testing/Procedures: None ordered    Follow-Up: At [facility], you and your health needs are our priority.  As part of our continuing mission to provide you with exceptional heart care, we have created designated Provider Care Teams.  These Care Teams include your primary Cardiologist (physician) and Advanced Practice Providers (APPs -  Physician Assistants and Nurse Practitioners) who all work together to provide you with the care you need, when you need it.  We recommend signing up for the patient portal called "MyChart".  Sign up information is provided on this After Visit Summary.  MyChart is used to connect with patients for Virtual Visits (Telemedicine).  Patients are able to view lab/test results, encounter notes, upcoming appointments, etc.  Non-urgent messages can be sent to your provider as well.   To learn more about what you can do with MyChart, go to [email or web address].    Your next appointment:   November 16 @ 8 am  The format for your next appointment:   In Person  Provider:   K. Mali Hilty, MD        Signed, Almyra Deforest, [place]  07/30/2020 8:28 PM    [place]

## 2020-07-28 NOTE — Patient Instructions (Signed)
Medication Instructions:  Your Physician recommend you continue on your current medication as directed.    *If you need a refill on your cardiac medications before your next appointment, please call your pharmacy*   Lab Work: None ordered   Testing/Procedures: None ordered    Follow-Up: At [facility], you and your health needs are our priority.  As part of our continuing mission to provide you with exceptional heart care, we have created designated Provider Care Teams.  These Care Teams include your primary Cardiologist (physician) and Advanced Practice Providers (APPs -  Physician Assistants and Nurse Practitioners) who all work together to provide you with the care you need, when you need it.  We recommend signing up for the patient portal called "MyChart".  Sign up information is provided on this After Visit Summary.  MyChart is used to connect with patients for Virtual Visits (Telemedicine).  Patients are able to view lab/test results, encounter notes, upcoming appointments, etc.  Non-urgent messages can be sent to your provider as well.   To learn more about what you can do with MyChart, go to [email or web address].    Your next appointment:   November 16 @ 8 am  The format for your next appointment:   In Person  Provider:   Raliegh Ip Mali Hilty, MD

## 2020-07-30 ENCOUNTER — Encounter: Payer: Self-pay | Admitting: Physician Assistant

## 2020-08-01 ENCOUNTER — Other Ambulatory Visit (HOSPITAL_COMMUNITY): Payer: Self-pay | Admitting: Physician Assistant

## 2020-08-01 ENCOUNTER — Other Ambulatory Visit: Payer: Self-pay | Admitting: Internal Medicine

## 2020-08-01 ENCOUNTER — Other Ambulatory Visit: Payer: Self-pay

## 2020-08-09 DIAGNOSIS — H11122 Conjunctival concretions, left eye: Secondary | ICD-10-CM | POA: Diagnosis not present

## 2020-08-09 DIAGNOSIS — H43813 Vitreous degeneration, bilateral: Secondary | ICD-10-CM | POA: Diagnosis not present

## 2020-08-09 DIAGNOSIS — H2513 Age-related nuclear cataract, bilateral: Secondary | ICD-10-CM | POA: Diagnosis not present

## 2020-08-20 NOTE — Interval H&P Note (Signed)
History and Physical Interval Note:   Leslie Bryant  has presented today for surgery, with the diagnosis of AFIB.  The various methods of treatment have been discussed with the patient and family. After consideration of risks, benefits and other options for treatment, the patient has consented to  Procedure(s): CARDIOVERSION (N/A) as a surgical intervention.  The patient's history has been reviewed, patient examined, no change in status, stable for surgery.  I have reviewed the patient's chart and labs.  Questions were answered to the patient's satisfaction.     Quran Vasco Harrell Gave

## 2020-09-19 ENCOUNTER — Ambulatory Visit: Payer: Medicare Other | Admitting: Internal Medicine

## 2020-09-19 ENCOUNTER — Other Ambulatory Visit: Payer: Self-pay

## 2020-09-19 ENCOUNTER — Encounter: Payer: Self-pay | Admitting: Internal Medicine

## 2020-09-19 VITALS — BP 112/80 | HR 73 | Ht 62.0 in | Wt 165.0 lb

## 2020-09-19 DIAGNOSIS — I480 Paroxysmal atrial fibrillation: Secondary | ICD-10-CM

## 2020-09-19 DIAGNOSIS — E785 Hyperlipidemia, unspecified: Secondary | ICD-10-CM

## 2020-09-19 DIAGNOSIS — I5032 Chronic diastolic (congestive) heart failure: Secondary | ICD-10-CM

## 2020-09-19 DIAGNOSIS — Z79899 Other long term (current) drug therapy: Secondary | ICD-10-CM

## 2020-09-19 NOTE — Patient Instructions (Signed)

## 2020-09-19 NOTE — Progress Notes (Signed)
OFFICE NOTE  Chief Complaint:  Follow-up A. fib, shortness of breath   Primary Care Physician: Biagio Borg, MD  HPI:  Leslie Bryant is a 79 y.o. female who is a former patient of Dr. Mare Ferrari. She is followed along with him for a number years and he also followed her husband who unfortunately died. She has no known coronary artery disease. She had heart catheterization 2002 which showed essentially normal coronaries. She's been followed for palpitations, history of PSVT which is been well controlled and dyslipidemia. Recently she was seen in the emergency department after working strenuously in the yard on a very hot day. She felt some fluttering in her chest and was noted to have some unifocal PVCs on a monitor. Recently her metoprolol was increased to 50 mg twice a day. She was advised to decrease caffeine intake. Since that time she's had no further recurrence and felt like it may be related to working outside in the hot weather. Blood pressure is well-controlled today. She has a history of intolerance to statins. Recently she had had some pain in her legs and thought it was related to study of. She was taken off that medicine and listed as an allergy however her symptoms did not change therefore she is interested in going back on the medicine. While she was on Zetia her LDL was 120 which is reasonable control.   12/24/2016 Leslie Bryant returns a for follow-up. Overall she seems to be doing well although suffering from some right knee pain. She told me that she intends to have right knee surgery hopefully in April with Dr. Sharol Given. She is asking for cardiovascular clearance. She denies any chest pain or worsening shortness of breath. Her blood pressure is well-controlled. She has been tolerant of ezetimibe 10 mg daily for cholesterol and recently her LDL was less than 80. She's previous he been intolerant of pravastatin, atorvastatin and rosuvastatin.  12/17/2017  Leslie Bryant is seen today in  follow-up.  She was recently in the emergency department beginning of February with some palpitations.  She also was describing some chest pain.  It was felt that this was more likely acid reflux.  Her PPI was doubled and her symptoms have improved.  She was noted to have PVCs, but this is known in her history.  She says her palpitations have improved and may be a lot related to stress.  We discussed possibility of using extra metoprolol or increasing her dose if necessary.  05/24/2019  Leslie Bryant is seen today in follow-up.  Overall she reports good control in her palpitations after we further increased her metoprolol to 50 mg twice daily.  Blood pressures generally in the 825-053 systolic range however was elevated this morning.  She reported she did not take her beta-blocker this morning.  She has normal sinus rhythm on EKG at 91.  She denies any chest pain or shortness of breath.  02/04/2020  Leslie Bryant returns today for a follow-up.  This is an acute visit as she was seen in the ER yesterday for shortness of breath.  She says has been building for about a couple weeks and was noted to be somewhat orthopneic requiring an extra pillow to elevate her head at night, short of breath and having a mild nonproductive cough.  In the ER she was found to have some mild interstitial edema on her chest x-ray and mildly elevated BNP just above 150.  She was placed on Lasix 40 mg daily  and told to follow-up with me today.  She reports perhaps a mild improvement in her breathing and weight is noted to be down a couple of pounds.  She denies any chest pain.  She has had both Covid vaccines and does not report any known Covid exposure.  This was not tested.  She also had a negative troponin.  She also had recent lipids showing total cholesterol 197, triglycerides 157, HDL 49 and LDL 116, somewhat improved only on ezetimibe monotherapy.  02/24/2020  Leslie Bryant is seen today in follow-up.  Her echo revealed the possibility of  atrial fibrillation.  We placed her on a monitor which did indeed demonstrate atrial fibrillation.  She was referred for some reason to the A. fib clinic and started on Eliquis.  She was seen by Adline Peals, PA-C who also increased her beta-blocker.  She returns today stating she is feeling pretty well.  She is in A. fib today.  She has been wearing it 2-week ZIO monitor which she is wearing today and will be turning it in.  Hopefully will have results to review next week to see what her burden of A. fib is.  I do believe is still paroxysmal which case there is probably little role for her cardioversion.  She is on Eliquis and tolerating it well.  Heart rate control is reasonable at 90 today.  She does report some improvement in her dyspnea and had perhaps some mild diastolic heart failure.  EF was normal on echo.   03/16/2020  Leslie Bryant returns today for follow-up.  She reports some persistent shortness of breath.  After wearing a 2-week ZIO monitor she had a very high burden of A. fib of around 95% this has been more persistent towards the latter part of the monitoring period.  Your EKG today shows atrial fibrillation.  She reports some dyspnea although yesterday she said she felt a little better.  I think would be reasonable to consider a cardioversion.  She has been anticoagulated on Eliquis without missed doses.  09/19/2020  Leslie Bryant returns today for follow-up.  Over the past several months she has been receiving quite a bit of care.  She underwent a cardioversion which was successful but then reverted back to A. fib.  Then she underwent stress testing which showed a fixed defect suggestive of scar.  Because of concern for prior coronary disease I felt her antiarrhythmic options were limited.  I referred her to EP and she saw Dr. Caryl Comes.  He ultimately recommended amiodarone.  She was loaded on this and then underwent a repeat cardioversion procedure in September.  This was successful and she is now  maintaining sinus rhythm.  She reports she feels much better.  She had some heart failure symptoms, likely diastolic heart failure with BNP in the 1500s but that improved with Lasix and maintaining normal rhythm.  Overall she says she feels great at this point and is active and can do pretty much anything she wants to.  She is maintaining sinus.  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  Resolved by CT scan Recurrent episode clinical dx 07/2012    Diverticulosis of colon    sigmoid   GERD (gastroesophageal reflux disease)    H/O hiatal hernia    History of kidney stones    "passed it" (02/19/2017)   Hypercholesterolemia    history   Left knee pain    occasionally   Palpitations IRREGULAR HEARTBEAT--  CONTROLLED W/  BETA BLOCKER   Pneumonia 2017   "walking pneumonia" (02/19/2017)   Swelling of left knee joint     Past Surgical History:  Procedure Laterality Date   BREAST BIOPSY Right 1986   BREAST BIOPSY Left    BREAST RECONSTRUCTION Right 1987   W/ IMPLANTS   CARDIAC CATHETERIZATION  12-22-2000  DR BRACKBILL   NORMAL LVF/ NORMAL CORONARY ARTERIES   CARDIOVERSION N/A 03/23/2020   Procedure: CARDIOVERSION;  Surgeon: Donato Heinz, MD;  [facility] ENDOSCOPY;  Service: Cardiovascular;  Laterality: N/A;   CARDIOVERSION N/A 07/20/2020   Procedure: CARDIOVERSION;  Surgeon: Buford Dresser, MD;  Location: [place];  Service: Cardiovascular;  Laterality: N/A;   [place]   CHONDROPLASTY  09/08/2012   Procedure: CHONDROPLASTY;  Surgeon: Tobi Bastos, MD;  Location: [place];  Service: Orthopedics;  Laterality: Left;   COLONOSCOPY     last colon 05-03-2009 (02/19/2017)   COLONOSCOPY W/ BIOPSIES AND POLYPECTOMY     KNEE ARTHROSCOPY WITH MEDIAL MENISECTOMY  09/08/2012   Procedure: KNEE ARTHROSCOPY WITH MEDIAL MENISECTOMY;  Surgeon: Tobi Bastos, MD;  Location: [place];  Service: Orthopedics;  Laterality: Left;  lateral tibial plateau,    MASTECTOMY Right 1986   W/ MULTIPLE NODE DISSECTION   REDUCTION MAMMAPLASTY Left 1987   TOTAL KNEE ARTHROPLASTY Left 07/21/2013   Procedure: TOTAL KNEE ARTHROPLASTY;  Surgeon: Newt Minion, MD;  Location: [place];  Service: Orthopedics;  Laterality: Left;  Left Total Knee Arthroplasty   TOTAL KNEE ARTHROPLASTY Right 02/19/2017   Procedure: RIGHT TOTAL KNEE ARTHROPLASTY;  Surgeon: Newt Minion, MD;  Location: [place];  Service: Orthopedics;  Laterality: Right;   VAGINAL HYSTERECTOMY  1966    FAMHx:  Family History  Problem Relation Age of Onset   Heart attack Father    Heart failure Mother        CHF   Dementia Maternal Grandfather    Dementia Sister    Colon cancer Brother        died/age 58   Esophageal cancer Brother    Lung cancer Sister    Brain cancer Sister    Cancer Brother        mouth   Rectal cancer Brother    Leukemia Brother    Cancer Sister        type unknown   Cancer Sister        type unknown   Heart disease Daughter    Stomach cancer Neg Hx     SOCHx:   reports that she has never smoked. She has never used smokeless tobacco. She reports current alcohol use of about 1.0 standard drink of alcohol per week. She reports that she does not use drugs.  ALLERGIES:  Allergies  Allergen Reactions   Crestor [Rosuvastatin Calcium] Other (See Comments)    Shoulder pain   Lipitor [Atorvastatin Calcium] Other (See Comments)    Myalgias    Pravachol [Pravastatin] Other (See Comments)    myalgias   Penicillins Itching    Has patient had a PCN  reaction causing immediate rash, facial/tongue/throat swelling, SOB or lightheadedness with hypotension: No Has patient had a PCN reaction causing severe rash involving mucus membranes or skin necrosis: No Has patient had a PCN reaction that required hospitalization No Has patient had a PCN reaction occurring within the last 10 years: No If all of the above answers are "NO", then may proceed with Cephalosporin use.     ROS: Pertinent items noted in HPI and remainder of comprehensive ROS otherwise negative.  HOME MEDS: Current Outpatient Medications  Medication Sig Dispense Refill   amiodarone (PACERONE) 200 MG tablet Take 1 tablet (200 mg total) by mouth daily. 90 tablet 3   apixaban (ELIQUIS) 5 MG TABS tablet Take 1 tablet (5 mg total) by mouth 2 (two) times daily. 60 tablet 11   calcium carbonate (OS-CAL - DOSED IN MG OF ELEMENTAL CALCIUM) 1250 (500 Ca) MG tablet Take 1 tablet by mouth daily with breakfast.     co-enzyme Q-10 30 MG capsule Take 30 mg by mouth 2 (two) times daily.      ezetimibe (ZETIA) 10 MG tablet Take 10 mg by mouth daily.     furosemide (LASIX) 40 MG tablet Take 1 tablet (40 mg total) by mouth daily. 90 tablet 3   hyoscyamine (LEVSIN SL) 0.125 MG SL tablet DISSOLVE 1 TABLET IN MOUTH THREE TIMES DAILY AS NEEDED FOR  ABDOMINAL  CRAMPS 30 tablet 2   metoprolol tartrate (LOPRESSOR) 50 MG tablet TAKE 1 & 1/2 (ONE & ONE-HALF) TABLETS BY MOUTH TWICE DAILY 180 tablet 0   Multiple Vitamins-Minerals (CENTRUM SILVER PO) Take 1 tablet by mouth every morning. Align     omeprazole (PRILOSEC) 40 MG capsule TAKE 1 CAPSULE BY MOUTH TWICE DAILY BEFORE MEAL(S) 180 capsule 1   No current facility-administered medications for this visit.    LABS/IMAGING: No results found for this or any previous visit (from the past 48 hour(s)). No results found.  WEIGHTS: Wt Readings from Last 3 Encounters:  09/19/20 165 lb (74.8 kg)  07/28/20 162 lb 6.4 oz (73.7 kg)  07/25/20 160 lb  9.6 oz (72.8 kg)    VITALS: BP 112/80    Pulse 73    Ht 5\' 2"  (1.575 m)    Wt 165 lb (74.8 kg)    SpO2 96%    BMI 30.18 kg/m   EXAM: General appearance: alert and no distress Neck: no carotid bruit and no JVD Lungs: clear to auscultation bilaterally Heart: regular rate and rhythm and systolic murmur: early systolic 2/6, crescendo at 2nd right intercostal space Abdomen: soft, non-tender; bowel sounds normal; no masses,  no organomegaly Extremities: extremities normal, atraumatic, no cyanosis or edema Pulses: 2+ and symmetric Skin: Skin color, texture, turgor normal. No rashes or lesions Neurologic: Grossly normal Psych: Pleasant  EKG: Sinus rhythm first-degree AV block at 73-personally reviewed  ASSESSMENT: 1. Paroxysmal fibrillation-CHA2DS2-VASc score of 4 on Eliquis, amiodarone 2. Dyspnea on exertion - probably mild diastolic CHF, LVEF 38-25% (02/2020) 3. Palpitations-history of PSVT and occasional PVCs 4. Dyslipidemia-intolerant to statins 5. Essential hypertension 6. GERD  PLAN: 1.   Leslie Bryant is doing well now on amiodarone.  She has follow-up with Dr. Caryl Comes next week.  I will defer to him as to whether or not he may consider lowering her dose further.  She denies any heart failure symptoms.  Her creatinine improved on Lasix that she should continue on that on a daily basis.  She has been intolerant to statins but remains on ezetimibe with  an LDL around 100.  Blood pressures well controlled.  We will plan repeat labs next year including a TSH, CMET and lipid profile for monitoring of amiodarone and her cholesterol.  Pixie Casino, MD, [facility], [place] Director of the Advanced Lipid Disorders &  Cardiovascular Risk Reduction Clinic Diplomate of the American Board of Clinical Lipidology Attending Cardiologist  Direct Dial: [phone]   Fax: [phone]  Website:  [email or web address] Brailee Riede 09/19/2020, 8:07 AM

## 2020-09-27 ENCOUNTER — Encounter: Payer: Self-pay | Admitting: Internal Medicine

## 2020-09-27 ENCOUNTER — Other Ambulatory Visit: Payer: Self-pay

## 2020-09-27 ENCOUNTER — Ambulatory Visit: Payer: Medicare Other | Admitting: Internal Medicine

## 2020-09-27 VITALS — BP 140/72 | HR 68 | Ht 62.0 in | Wt 167.0 lb

## 2020-09-27 DIAGNOSIS — I480 Paroxysmal atrial fibrillation: Secondary | ICD-10-CM

## 2020-09-27 NOTE — Patient Instructions (Signed)
Medication Instructions:  Your physician recommends that you continue on your current medications as directed. Please refer to the Current Medication list given to you today.  *If you need a refill on your cardiac medications before your next appointment, please call your pharmacy*   Lab Work: TODAY: TSH, CMET, CBC  If you have labs (blood work) drawn today and your tests are completely normal, you will receive your results only by: Marland Kitchen MyChart Message (if you have MyChart) OR . A paper copy in the mail If you have any lab test that is abnormal or we need to change your treatment, we will call you to review the results.   Testing/Procedures: None   Follow-Up:  Follow up with Dr. Caryl Comes or EP APP in 3 months  At [facility], you and your health needs are our priority.  As part of our continuing mission to provide you with exceptional heart care, we have created designated Provider Care Teams.  These Care Teams include your primary Cardiologist (physician) and Advanced Practice Providers (APPs -  Physician Assistants and Nurse Practitioners) who all work together to provide you with the care you need, when you need it.  We recommend signing up for the patient portal called "MyChart".  Sign up information is provided on this After Visit Summary.  MyChart is used to connect with patients for Virtual Visits (Telemedicine).  Patients are able to view lab/test results, encounter notes, upcoming appointments, etc.  Non-urgent messages can be sent to your provider as well.   To learn more about what you can do with MyChart, go to [email or web address].    Your next appointment:   9 month(s)  The format for your next appointment:   In Person  Provider:   K. Mali Hilty, MD   Other Instructions NOne

## 2020-09-27 NOTE — Progress Notes (Signed)
Patient Care Team: Biagio Borg, MD as PCP - General Hilty, Nadean Corwin, MD as PCP - Cardiology (Cardiology) Clark-Burning, Anderson Malta, PA-C (Inactive) (Dermatology)   HPI  Leslie Bryant is a 79 y.o. female seen in follow-up for atrial fibrillation resistant to cardioversion coagulated with Eliquis. At initial consultation 8/21, antiarrhythmic therapy was considered.  A fixed defect was identified concerning for prior infarction and once these were excluded.  QTC precluded class III's and she was started on amiodarone.  Seen in A. fib clinic 9/21 holding sinus rhythm at that time and confirmed again last week with Dr. Debara Pickett  She has much improved in sinus rhythm.  No nausea.  She does have a cough but it antedates the amiodarone and is productive.  No edema  ; dyspnea and fatigue much improved  No bleeding   DATE TEST EF   2002 LHC  No obstructive CAD  4/21 Echo   60-65 % LA normal size  6/21 MYOVIEW   Perfusion defect apex        Date Cr K TSH LFT Hgb  6/21 1.12 3.9 1.3 32 12.7   9/21 1.07 4.1 2.2 44 11.8          Thromboembolic risk factors ( age  -2 , Vasc disease -1, Gender-1) for a CHADSVASc Score of 4 Records and Results Reviewed   Past Medical History:  Diagnosis Date  . Acute meniscal tear of knee LEFT KNEE  . Allergic rhinitis   . Allergy   . Arthritis    "knees" (02/19/2017)  . Atrial fibrillation ([place])   . Breast cancer, right breast ([place]) 1986  . 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 Diverticulitis of large intestine with perforation 11/02/2011   Microperforation probably related to nut and popcorn consumption.  Resolved by CT scan Recurrent episode clinical dx 07/2012   . Diverticulosis of colon    sigmoid  . GERD (gastroesophageal reflux disease)   . H/O hiatal hernia   . History of kidney stones    "passed it" (02/19/2017)  . Hypercholesterolemia    history  . Left knee pain    occasionally  . Palpitations IRREGULAR HEARTBEAT--   CONTROLLED W/  BETA BLOCKER  . Pneumonia 2017   "walking pneumonia" (02/19/2017)  . Swelling of left knee joint     Past Surgical History:  Procedure Laterality Date  . BREAST BIOPSY Right 1986  . BREAST BIOPSY Left   . BREAST RECONSTRUCTION Right 1987   W/ IMPLANTS  . CARDIAC CATHETERIZATION  12-22-2000  DR BRACKBILL   NORMAL LVF/ NORMAL CORONARY ARTERIES  . CARDIOVERSION N/A 03/23/2020   Procedure: CARDIOVERSION;  Surgeon: Donato Heinz, MD;  Location: [facility] ENDOSCOPY;  Service: Cardiovascular;  Laterality: N/A;  . CARDIOVERSION N/A 07/20/2020   Procedure: CARDIOVERSION;  Surgeon: Buford Dresser, MD;  Location: [place];  Service: Cardiovascular;  Laterality: N/A;  . CHOLECYSTECTOMY OPEN  1988  . CHONDROPLASTY  09/08/2012   Procedure: CHONDROPLASTY;  Surgeon: Tobi Bastos, MD;  Location: [facility];  Service: Orthopedics;  Laterality: Left;  . COLONOSCOPY     last colon 05-03-2009 (02/19/2017)  . COLONOSCOPY W/ BIOPSIES AND POLYPECTOMY    . KNEE ARTHROSCOPY WITH MEDIAL MENISECTOMY  09/08/2012   Procedure: KNEE ARTHROSCOPY WITH MEDIAL MENISECTOMY;  Surgeon: Tobi Bastos, MD;  Location: [place];  Service: Orthopedics;  Laterality: Left;  lateral tibial plateau,   . MASTECTOMY Right 1986  W/ MULTIPLE NODE DISSECTION  . REDUCTION MAMMAPLASTY Left 1987  . TOTAL KNEE ARTHROPLASTY Left 07/21/2013   Procedure: TOTAL KNEE ARTHROPLASTY;  Surgeon: Newt Minion, MD;  Location: [place];  Service: Orthopedics;  Laterality: Left;  Left Total Knee Arthroplasty  . TOTAL KNEE ARTHROPLASTY Right 02/19/2017   Procedure: RIGHT TOTAL KNEE ARTHROPLASTY;  Surgeon: Newt Minion, MD;  Location: [place];  Service: Orthopedics;  Laterality: Right;  Marland Kitchen VAGINAL HYSTERECTOMY  1966    No outpatient medications have been marked as taking for the 09/27/20 encounter (Office Visit) with Deboraha Sprang, MD.    Allergies  Allergen Reactions  . Crestor  [Rosuvastatin Calcium] Other (See Comments)    Shoulder pain  . Lipitor [Atorvastatin Calcium] Other (See Comments)    Myalgias   . Pravachol [Pravastatin] Other (See Comments)    myalgias  . Penicillins Itching    Has patient had a PCN reaction causing immediate rash, facial/tongue/throat swelling, SOB or lightheadedness with hypotension: No Has patient had a PCN reaction causing severe rash involving mucus membranes or skin necrosis: No Has patient had a PCN reaction that required hospitalization No Has patient had a PCN reaction occurring within the last 10 years: No If all of the above answers are "NO", then may proceed with Cephalosporin use.       Review of Systems negative except from HPI and PMH  Physical Exam BP 140/72   Pulse 68   Ht 5\' 2"  (1.575 m)   Wt 167 lb (75.8 kg)   SpO2 96%   BMI 30.54 kg/m  Well developed and well nourished in no acute distress HENT normal E scleral and icterus clear Neck Supple JVP flat; carotids brisk and full Clear to ausculation  Regular rate and rhythm, no murmurs gallops or rub Soft with active bowel sounds No clubbing cyanosis Edema Alert and oriented, grossly normal motor and sensory function Skin Warm and Dry  ECG sinus at 68 Intervals 24/08/44  CrCl cannot be calculated (Patient's most recent lab result is older than the maximum 21 days allowed.).   Assessment and  Plan Atrial fibrillation-persistent  HFpEF  Hypertension  High risk medication surveillance-amiodarone   Much improved on amiodarone.  Her cough is not likely manifestation of amiodarone toxicity and that is productive and antedates the drug.  She needs amiodarone surveillance laboratories.  We will plan to recheck him again in 3 more months.  Euvolemic continue current meds  No bleeding  Blood pressure remains borderline elevated.  We will keep closer track of this.  For now we will continue her on Lopressor.   Current medicines are reviewed at  length with the patient today .  The patient does not  have concerns regarding medicines.

## 2020-09-28 LAB — COMPREHENSIVE METABOLIC PANEL
ALT: 36 IU/L — ABNORMAL HIGH (ref 0–32)
AST: 36 IU/L (ref 0–40)
Albumin/Globulin Ratio: 1.5 (ref 1.2–2.2)
Albumin: 4.3 g/dL (ref 3.7–4.7)
Alkaline Phosphatase: 73 IU/L (ref 44–121)
BUN/Creatinine Ratio: 16 (ref 12–28)
BUN: 13 mg/dL (ref 8–27)
Bilirubin Total: 0.2 mg/dL (ref 0.0–1.2)
CO2: 25 mmol/L (ref 20–29)
Calcium: 9.5 mg/dL (ref 8.7–10.3)
Chloride: 99 mmol/L (ref 96–106)
Creatinine, Ser: 0.79 mg/dL (ref 0.57–1.00)
GFR calc Af Amer: 82 mL/min/{1.73_m2} (ref >59)
GFR calc non Af Amer: 71 mL/min/{1.73_m2} (ref >59)
Globulin, Total: 2.9 g/dL (ref 1.5–4.5)
Glucose: 83 mg/dL (ref 65–99)
Potassium: 4.8 mmol/L (ref 3.5–5.2)
Sodium: 136 mmol/L (ref 134–144)
Total Protein: 7.2 g/dL (ref 6.0–8.5)

## 2020-09-28 LAB — CBC
Hematocrit: 36.1 % (ref 34.0–46.6)
Hemoglobin: 12.1 g/dL (ref 11.1–15.9)
MCH: 31.2 pg (ref 26.6–33.0)
MCHC: 33.5 g/dL (ref 31.5–35.7)
MCV: 93 fL (ref 79–97)
Platelets: 317 10*3/uL (ref 150–450)
RBC: 3.88 x10E6/uL (ref 3.77–5.28)
RDW: 12.4 % (ref 11.7–15.4)
WBC: 8.6 10*3/uL (ref 3.4–10.8)

## 2020-09-28 LAB — TSH: TSH: 1.79 u[IU]/mL (ref 0.450–4.500)

## 2020-11-06 DIAGNOSIS — J310 Chronic rhinitis: Secondary | ICD-10-CM | POA: Diagnosis not present

## 2020-11-06 DIAGNOSIS — M316 Other giant cell arteritis: Secondary | ICD-10-CM | POA: Diagnosis not present

## 2020-11-06 DIAGNOSIS — H9202 Otalgia, left ear: Secondary | ICD-10-CM | POA: Diagnosis not present

## 2020-11-06 DIAGNOSIS — R0982 Postnasal drip: Secondary | ICD-10-CM | POA: Diagnosis not present

## 2020-11-08 ENCOUNTER — Other Ambulatory Visit (INDEPENDENT_AMBULATORY_CARE_PROVIDER_SITE_OTHER): Payer: Self-pay | Admitting: Otolaryngology

## 2020-11-08 DIAGNOSIS — M315 Giant cell arteritis with polymyalgia rheumatica: Secondary | ICD-10-CM | POA: Diagnosis not present

## 2020-11-09 LAB — SEDIMENTATION RATE: Sed Rate: 50 mm/hr — ABNORMAL HIGH (ref 0–40)

## 2020-11-09 LAB — C-REACTIVE PROTEIN: CRP: 3 mg/L (ref 0–10)

## 2020-11-14 ENCOUNTER — Telehealth: Payer: Self-pay | Admitting: Internal Medicine

## 2020-11-14 DIAGNOSIS — H9209 Otalgia, unspecified ear: Secondary | ICD-10-CM

## 2020-11-14 NOTE — Telephone Encounter (Signed)
Ok done

## 2020-11-14 NOTE — Telephone Encounter (Signed)
  Patient requesting a referral to Dr Benjamine Mola Patient has upcoming appointment with this ENT, she has already seen him once

## 2020-11-15 NOTE — Telephone Encounter (Signed)
LVM instructing pt that the referral she requested has been done by Dr Jenny Reichmann; call back if further ques/concerns.

## 2020-11-28 ENCOUNTER — Other Ambulatory Visit (HOSPITAL_COMMUNITY): Payer: Self-pay | Admitting: Physician Assistant

## 2020-11-29 ENCOUNTER — Other Ambulatory Visit: Payer: Self-pay

## 2020-11-29 ENCOUNTER — Telehealth: Payer: Self-pay | Admitting: *Deleted

## 2020-11-29 DIAGNOSIS — J343 Hypertrophy of nasal turbinates: Secondary | ICD-10-CM | POA: Diagnosis not present

## 2020-11-29 DIAGNOSIS — H9202 Otalgia, left ear: Secondary | ICD-10-CM | POA: Diagnosis not present

## 2020-11-29 DIAGNOSIS — M316 Other giant cell arteritis: Secondary | ICD-10-CM | POA: Diagnosis not present

## 2020-11-29 DIAGNOSIS — J310 Chronic rhinitis: Secondary | ICD-10-CM | POA: Diagnosis not present

## 2020-11-29 MED ORDER — METOPROLOL TARTRATE 50 MG PO TABS: ORAL_TABLET | ORAL | 3 refills | Status: AC

## 2020-11-29 NOTE — Telephone Encounter (Signed)
   [facility] Pre-operative Risk Assessment    HEARTCARE STAFF: - Please ensure there is not already an duplicate clearance open for this procedure. - Under Visit Info/Reason for Call, type in Other and utilize the format Clearance MM/DD/YY or Clearance TBD. Do not use dashes or single digits. - If request is for dental extraction, please clarify the # of teeth to be extracted.  Request for surgical clearance:  1. What type of surgery is being performed? LEFT TEMPORAL ARTERY Bx    2. When is this surgery scheduled? 12/12/20   3. What type of clearance is required (medical clearance vs. Pharmacy clearance to hold med vs. Both)? BOTH  4. Are there any medications that need to be held prior to surgery and how long? ELIQUIS x 2 DAYS PRIOR TO PROCEDURE   5. Practice name and name of physician performing surgery? DR. Benjamine Mola   6. What is the office phone number? [phone]   7.   What is the office fax number? [phone]  8.   Anesthesia type (None, local, MAC, general) ? MAC   Julaine Hua 11/29/2020, 3:17 PM  _________________________________________________________________   (provider comments below)

## 2020-11-29 NOTE — Telephone Encounter (Signed)
Patient with diagnosis of atrial fibrillation on Eliquis for anticoagulation.    Procedure: left temporal artery biopsy Date of procedure: 12/12/20  CHA2DS2-VASc Score = 5  This indicates a 7.2% annual risk of stroke. The patient's score is based upon: CHF History: No HTN History: Yes Diabetes History: Yes Stroke History: No Vascular Disease History: No Age Score: 2 Gender Score: 1   CrCl 68 Platelet count 317  Per office protocol, patient can hold Eliquis for 2 days prior to procedure.    Patient will not need bridging with Lovenox (enoxaparin) around procedure.

## 2020-11-30 NOTE — Telephone Encounter (Signed)
   Primary Cardiologist: Pixie Casino, MD  Chart reviewed as part of pre-operative protocol coverage. Given past medical history and time since last visit, based on ACC/AHA guidelines, [place] would be at acceptable risk for the planned procedure without further cardiovascular testing.   Patient with diagnosis of atrial fibrillation on Eliquis for anticoagulation.    Procedure: left temporal artery biopsy Date of procedure: 12/12/20  CHA2DS2-VASc Score = 5  This indicates a 7.2% annual risk of stroke. The patient's score is based upon: CHF History: No HTN History: Yes Diabetes History: Yes Stroke History: No Vascular Disease History: No Age Score: 2 Gender Score: 1   CrCl 68 Platelet count 317  Per office protocol, patient can hold Eliquis for 2 days prior to procedure.    Patient will not need bridging with Lovenox (enoxaparin) around procedure.  I will route this recommendation to the requesting party via Epic fax function and remove from pre-op pool.  Please call with questions.  Jossie Ng. Terryn Rosenkranz NP-C    11/30/2020, 7:45 AM [address] [address] Office [phone] Fax [phone]

## 2020-12-04 ENCOUNTER — Other Ambulatory Visit: Payer: Self-pay | Admitting: Otolaryngology

## 2020-12-04 ENCOUNTER — Telehealth: Payer: Self-pay | Admitting: Internal Medicine

## 2020-12-04 NOTE — Telephone Encounter (Signed)
      I do not need this encounter

## 2020-12-05 ENCOUNTER — Encounter (HOSPITAL_BASED_OUTPATIENT_CLINIC_OR_DEPARTMENT_OTHER): Payer: Self-pay | Admitting: Otolaryngology

## 2020-12-08 ENCOUNTER — Other Ambulatory Visit (HOSPITAL_COMMUNITY): Payer: Medicare Other

## 2020-12-12 ENCOUNTER — Ambulatory Visit (HOSPITAL_BASED_OUTPATIENT_CLINIC_OR_DEPARTMENT_OTHER): Admission: RE | Admit: 2020-12-12 | Payer: Medicare Other | Source: Home / Self Care | Admitting: Otolaryngology

## 2020-12-12 SURGERY — BIOPSY TEMPORAL ARTERY
Anesthesia: Monitor Anesthesia Care | Laterality: Left

## 2020-12-22 ENCOUNTER — Other Ambulatory Visit: Payer: Self-pay | Admitting: Internal Medicine

## 2021-01-01 NOTE — Progress Notes (Signed)
PCP:  Biagio Borg, MD Primary Cardiologist: Pixie Casino, MD Electrophysiologist: Virl Axe, MD   Leslie Bryant is a 80 y.o. female seen today for Virl Axe, MD for routine electrophysiology followup.  Since last being seen in our clinic the patient reports doing well overall. She has mild SOB with moderate exertion, otherwise she is doing well without lightheadedness, dizziness, palpitations, or chest pain. She is having issues with ? Temporal arteritis and is pending scheduling for biopsy. She has intense pressure in that area on the left side of her head, especially with bending over.   Past Medical History:  Diagnosis Date  . Acute meniscal tear of knee LEFT KNEE  . Allergic rhinitis   . Allergy   . Arthritis    "knees" (02/19/2017)  . Atrial fibrillation ([place])   . Breast cancer, right breast ([place]) 1986  . 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 Diverticulitis of large intestine with perforation 11/02/2011   Microperforation probably related to nut and popcorn consumption.  Resolved by CT scan Recurrent episode clinical dx 07/2012   . Diverticulosis of colon    sigmoid  . GERD (gastroesophageal reflux disease)   . H/O hiatal hernia   . History of kidney stones    "passed it" (02/19/2017)  . Hypercholesterolemia    history  . Left knee pain    occasionally  . Palpitations IRREGULAR HEARTBEAT--  CONTROLLED W/  BETA BLOCKER  . Pneumonia 2017   "walking pneumonia" (02/19/2017)  . Swelling of left knee joint    Past Surgical History:  Procedure Laterality Date  . BREAST BIOPSY Right 1986  . BREAST BIOPSY Left   . BREAST RECONSTRUCTION Right 1987   W/ IMPLANTS  . CARDIAC CATHETERIZATION  12-22-2000  DR BRACKBILL   NORMAL LVF/ NORMAL CORONARY ARTERIES  . CARDIOVERSION N/A 03/23/2020   Procedure: CARDIOVERSION;  Surgeon: Donato Heinz, MD;  Location: [facility] ENDOSCOPY;  Service: Cardiovascular;  Laterality: N/A;  . CARDIOVERSION N/A 07/20/2020   Procedure:  CARDIOVERSION;  Surgeon: Buford Dresser, MD;  Location: [place];  Service: Cardiovascular;  Laterality: N/A;  . CHOLECYSTECTOMY OPEN  1988  . CHONDROPLASTY  09/08/2012   Procedure: CHONDROPLASTY;  Surgeon: Tobi Bastos, MD;  Location: [facility];  Service: Orthopedics;  Laterality: Left;  . COLONOSCOPY     last colon 05-03-2009 (02/19/2017)  . COLONOSCOPY W/ BIOPSIES AND POLYPECTOMY    . KNEE ARTHROSCOPY WITH MEDIAL MENISECTOMY  09/08/2012   Procedure: KNEE ARTHROSCOPY WITH MEDIAL MENISECTOMY;  Surgeon: Tobi Bastos, MD;  Location: [place];  Service: Orthopedics;  Laterality: Left;  lateral tibial plateau,   . MASTECTOMY Right 1986   W/ MULTIPLE NODE DISSECTION  . REDUCTION MAMMAPLASTY Left 1987  . TOTAL KNEE ARTHROPLASTY Left 07/21/2013   Procedure: TOTAL KNEE ARTHROPLASTY;  Surgeon: Newt Minion, MD;  Location: [place];  Service: Orthopedics;  Laterality: Left;  Left Total Knee Arthroplasty  . TOTAL KNEE ARTHROPLASTY Right 02/19/2017   Procedure: RIGHT TOTAL KNEE ARTHROPLASTY;  Surgeon: Newt Minion, MD;  Location: [place];  Service: Orthopedics;  Laterality: Right;  Marland Kitchen VAGINAL HYSTERECTOMY  1966    Current Outpatient Medications  Medication Sig Dispense Refill  . amiodarone (PACERONE) 200 MG tablet Take 1 tablet (200 mg total) by mouth daily. 90 tablet 3  . apixaban (ELIQUIS) 5 MG TABS tablet Take 1 tablet (5 mg total) by mouth 2 (two) times daily. 60 tablet 11  .  calcium carbonate (OS-CAL - DOSED IN MG OF ELEMENTAL CALCIUM) 1250 (500 Ca) MG tablet Take 1 tablet by mouth daily with breakfast.    . co-enzyme Q-10 30 MG capsule Take 30 mg by mouth 2 (two) times daily.    Marland Kitchen ezetimibe (ZETIA) 10 MG tablet Take 10 mg by mouth daily.    . famotidine (PEPCID) 40 MG tablet Take 40 mg by mouth at bedtime.    . furosemide (LASIX) 40 MG tablet Take 1 tablet (40 mg total) by mouth daily. 90 tablet 3  . hyoscyamine (LEVSIN SL) 0.125 MG SL tablet  DISSOLVE 1 TABLET IN MOUTH THREE TIMES DAILY AS NEEDED FOR ABDOMINAL CRAMPS 30 tablet 1  . ipratropium (ATROVENT) 0.06 % nasal spray as needed.    . metoprolol tartrate (LOPRESSOR) 50 MG tablet TAKE 1 & 1/2 (ONE & ONE-HALF) TABLETS BY MOUTH TWICE DAILY 270 tablet 3  . Multiple Vitamins-Minerals (CENTRUM SILVER PO) Take 1 tablet by mouth every morning. Align    . omeprazole (PRILOSEC) 40 MG capsule TAKE 1 CAPSULE BY MOUTH TWICE DAILY BEFORE MEAL(S) 180 capsule 1   No current facility-administered medications for this visit.    Allergies  Allergen Reactions  . Crestor [Rosuvastatin Calcium] Other (See Comments)    Shoulder pain  . Lipitor [Atorvastatin Calcium] Other (See Comments)    Myalgias   . Pravachol [Pravastatin] Other (See Comments)    myalgias  . Penicillins Itching    Has patient had a PCN reaction causing immediate rash, facial/tongue/throat swelling, SOB or lightheadedness with hypotension: No Has patient had a PCN reaction causing severe rash involving mucus membranes or skin necrosis: No Has patient had a PCN reaction that required hospitalization No Has patient had a PCN reaction occurring within the last 10 years: No If all of the above answers are "NO", then may proceed with Cephalosporin use.     Social History   Socioeconomic History  . Marital status: Married    Spouse name: Not on file  . Number of children: 2  . Years of education: Not on file  . Highest education level: Not on file  Occupational History  . Occupation: Retired  Tobacco Use  . Smoking status: Never Smoker  . Smokeless tobacco: Never Used  Vaping Use  . Vaping Use: Never used  Substance and Sexual Activity  . Alcohol use: Not Currently    Alcohol/week: 1.0 standard drink    Types: 1 Glasses of wine per week  . Drug use: No  . Sexual activity: Yes  Other Topics Concern  . Not on file  Social History Narrative   She is married and retired and has 2 grown children   1 glass of wine  a month no tobacco no drug use   Social Determinants of Radio broadcast assistant Strain: Not on file  Food Insecurity: Not on file  Transportation Needs: Not on file  Physical Activity: Not on file  Stress: Not on file  Social Connections: Not on file  Intimate Partner Violence: Not on file    Review of Systems: General: No chills, fever, night sweats or weight changes  Cardiovascular:  No chest pain, dyspnea on exertion, edema, orthopnea, palpitations, paroxysmal nocturnal dyspnea Dermatological: No rash, lesions or masses Respiratory: No cough, dyspnea Urologic: No hematuria, dysuria Abdominal: No nausea, vomiting, diarrhea, bright red blood per rectum, melena, or hematemesis Neurologic: No visual changes, weakness, changes in mental status All other systems reviewed and are otherwise negative except as noted  above.  Physical Exam: Vitals:   01/02/21 1003  BP: 120/68  Pulse: 69  SpO2: 96%  Weight: 167 lb (75.8 kg)  Height: 5' 1.5" (1.562 m)    GEN- The patient is well appearing, alert and oriented x 3 today.   HEENT: normocephalic, atraumatic; sclera clear, conjunctiva pink; hearing intact; oropharynx clear; neck supple, no JVP Lymph- no cervical lymphadenopathy Lungs- Clear to ausculation bilaterally, normal work of breathing.  No wheezes, rales, rhonchi Heart- Regular rate and rhythm, no murmurs, rubs or gallops, PMI not laterally displaced GI- soft, non-tender, non-distended, bowel sounds present, no hepatosplenomegaly Extremities- no clubbing, cyanosis, or edema; DP/PT/radial pulses 2+ bilaterally MS- no significant deformity or atrophy Skin- warm and dry, no rash or lesion Psych- euthymic mood, full affect Neuro- strength and sensation are intact  EKG is not ordered. Personal review of EKG from 09/27/2020 shows NSR at 68 bpm  Additional studies reviewed include: Previous EP notes  Assessment and Plan:  1. Persistent atrial fibrillation Continue  amiodarone. Surveillance labs today.   2. HFpEF Doing well overall NYHA II symptoms  3. HTN Continue current medications  4. High risk drug surveillance - amiodarone CMET and TSH today.  Shirley Friar, PA-C  01/02/21 10:15 AM

## 2021-01-02 ENCOUNTER — Other Ambulatory Visit: Payer: Self-pay

## 2021-01-02 ENCOUNTER — Other Ambulatory Visit: Payer: Self-pay | Admitting: Otolaryngology

## 2021-01-02 ENCOUNTER — Encounter: Payer: Self-pay | Admitting: Student

## 2021-01-02 ENCOUNTER — Ambulatory Visit: Payer: Medicare Other | Admitting: Student

## 2021-01-02 VITALS — BP 120/68 | HR 69 | Ht 61.5 in | Wt 167.0 lb

## 2021-01-02 DIAGNOSIS — I5032 Chronic diastolic (congestive) heart failure: Secondary | ICD-10-CM | POA: Diagnosis not present

## 2021-01-02 DIAGNOSIS — Z79899 Other long term (current) drug therapy: Secondary | ICD-10-CM

## 2021-01-02 DIAGNOSIS — I4819 Other persistent atrial fibrillation: Secondary | ICD-10-CM

## 2021-01-02 LAB — COMPREHENSIVE METABOLIC PANEL
ALT: 42 IU/L — ABNORMAL HIGH (ref 0–32)
AST: 40 IU/L (ref 0–40)
Albumin/Globulin Ratio: 1.5 (ref 1.2–2.2)
Albumin: 4.4 g/dL (ref 3.7–4.7)
Alkaline Phosphatase: 63 IU/L (ref 44–121)
BUN/Creatinine Ratio: 19 (ref 12–28)
BUN: 20 mg/dL (ref 8–27)
Bilirubin Total: 0.3 mg/dL (ref 0.0–1.2)
CO2: 23 mmol/L (ref 20–29)
Calcium: 10 mg/dL (ref 8.7–10.3)
Chloride: 94 mmol/L — ABNORMAL LOW (ref 96–106)
Creatinine, Ser: 1.05 mg/dL — ABNORMAL HIGH (ref 0.57–1.00)
Globulin, Total: 3 g/dL (ref 1.5–4.5)
Glucose: 112 mg/dL — ABNORMAL HIGH (ref 65–99)
Potassium: 4.7 mmol/L (ref 3.5–5.2)
Sodium: 135 mmol/L (ref 134–144)
Total Protein: 7.4 g/dL (ref 6.0–8.5)
eGFR: 54 mL/min/{1.73_m2} — ABNORMAL LOW (ref >59)

## 2021-01-02 LAB — TSH: TSH: 1.69 u[IU]/mL (ref 0.450–4.500)

## 2021-01-02 NOTE — Patient Instructions (Addendum)
Medication Instructions:  Your physician recommends that you continue on your current medications as directed. Please refer to the Current Medication list given to you today.  *If you need a refill on your cardiac medications before your next appointment, please call your pharmacy*   Lab Work: Today: CMET, TSH If you have labs (blood work) drawn today and your tests are completely normal, you will receive your results only by: Marland Kitchen MyChart Message (if you have MyChart) OR . A paper copy in the mail If you have any lab test that is abnormal or we need to change your treatment, we will call you to review the results.   Follow-Up: At [facility], you and your health needs are our priority.  As part of our continuing mission to provide you with exceptional heart care, we have created designated Provider Care Teams.  These Care Teams include your primary Cardiologist (physician) and Advanced Practice Providers (APPs -  Physician Assistants and Nurse Practitioners) who all work together to provide you with the care you need, when you need it.  Your next appointment:   04/20/2021  The format for your next appointment:   In Person  Provider:   Virl Axe, MD

## 2021-01-09 ENCOUNTER — Other Ambulatory Visit: Payer: Self-pay

## 2021-01-09 ENCOUNTER — Encounter (HOSPITAL_BASED_OUTPATIENT_CLINIC_OR_DEPARTMENT_OTHER): Payer: Self-pay | Admitting: Otolaryngology

## 2021-01-12 ENCOUNTER — Other Ambulatory Visit (HOSPITAL_COMMUNITY)
Admission: RE | Admit: 2021-01-12 | Discharge: 2021-01-12 | Disposition: A | Discharge status: Home / Self Care | Payer: Medicare Other | Source: Ambulatory Visit | Attending: Otolaryngology | Admitting: Otolaryngology

## 2021-01-12 DIAGNOSIS — Z20822 Contact with and (suspected) exposure to covid-19: Secondary | ICD-10-CM | POA: Insufficient documentation

## 2021-01-12 DIAGNOSIS — Z01812 Encounter for preprocedural laboratory examination: Secondary | ICD-10-CM | POA: Insufficient documentation

## 2021-01-12 LAB — SARS CORONAVIRUS 2 (TAT 6-24 HRS): SARS Coronavirus 2: NEGATIVE

## 2021-01-16 ENCOUNTER — Encounter (HOSPITAL_BASED_OUTPATIENT_CLINIC_OR_DEPARTMENT_OTHER): Payer: Self-pay | Admitting: Otolaryngology

## 2021-01-16 ENCOUNTER — Encounter (HOSPITAL_BASED_OUTPATIENT_CLINIC_OR_DEPARTMENT_OTHER)
Admission: RE | Disposition: A | Discharge status: Home / Self Care | Payer: Self-pay | Source: Ambulatory Visit | Attending: Otolaryngology

## 2021-01-16 ENCOUNTER — Other Ambulatory Visit: Payer: Self-pay

## 2021-01-16 ENCOUNTER — Ambulatory Visit (HOSPITAL_BASED_OUTPATIENT_CLINIC_OR_DEPARTMENT_OTHER)
Admission: RE | Admit: 2021-01-16 | Discharge: 2021-01-16 | Disposition: A | Discharge status: Home / Self Care | Payer: Medicare Other | Source: Ambulatory Visit | Attending: Otolaryngology | Admitting: Otolaryngology

## 2021-01-16 ENCOUNTER — Ambulatory Visit (HOSPITAL_BASED_OUTPATIENT_CLINIC_OR_DEPARTMENT_OTHER): Payer: Medicare Other | Admitting: Certified Registered"

## 2021-01-16 DIAGNOSIS — Z7901 Long term (current) use of anticoagulants: Secondary | ICD-10-CM | POA: Diagnosis not present

## 2021-01-16 DIAGNOSIS — J310 Chronic rhinitis: Secondary | ICD-10-CM | POA: Diagnosis not present

## 2021-01-16 DIAGNOSIS — Z79899 Other long term (current) drug therapy: Secondary | ICD-10-CM | POA: Insufficient documentation

## 2021-01-16 DIAGNOSIS — I708 Atherosclerosis of other arteries: Secondary | ICD-10-CM | POA: Diagnosis not present

## 2021-01-16 DIAGNOSIS — I7789 Other specified disorders of arteries and arterioles: Secondary | ICD-10-CM | POA: Insufficient documentation

## 2021-01-16 DIAGNOSIS — R519 Headache, unspecified: Secondary | ICD-10-CM | POA: Diagnosis not present

## 2021-01-16 DIAGNOSIS — I4891 Unspecified atrial fibrillation: Secondary | ICD-10-CM | POA: Diagnosis not present

## 2021-01-16 DIAGNOSIS — E871 Hypo-osmolality and hyponatremia: Secondary | ICD-10-CM | POA: Diagnosis not present

## 2021-01-16 DIAGNOSIS — M316 Other giant cell arteritis: Secondary | ICD-10-CM | POA: Diagnosis not present

## 2021-01-16 DIAGNOSIS — I471 Supraventricular tachycardia: Secondary | ICD-10-CM | POA: Diagnosis not present

## 2021-01-16 HISTORY — DX: Headache, unspecified: R51.9

## 2021-01-16 HISTORY — PX: ARTERY BIOPSY: SHX891

## 2021-01-16 SURGERY — BIOPSY TEMPORAL ARTERY
Anesthesia: Monitor Anesthesia Care | Site: Face | Laterality: Left

## 2021-01-16 MED ORDER — OXYCODONE HCL 5 MG/5ML PO SOLN: 5.0000 mg | Freq: Once | ORAL | Status: DC | PRN

## 2021-01-16 MED ORDER — FENTANYL CITRATE (PF) 100 MCG/2ML IJ SOLN
INTRAMUSCULAR | Status: AC
  Filled 2021-01-16: qty 2

## 2021-01-16 MED ORDER — FENTANYL CITRATE (PF) 100 MCG/2ML IJ SOLN
25.0000 ug | INTRAMUSCULAR | Status: DC | PRN
Start: 2021-01-16 — End: 2021-01-16

## 2021-01-16 MED ORDER — ONDANSETRON HCL 4 MG/2ML IJ SOLN
INTRAMUSCULAR | Status: DC | PRN
  Administered 2021-01-16: 4 mg via INTRAVENOUS

## 2021-01-16 MED ORDER — CLINDAMYCIN PHOSPHATE 900 MG/50ML IV SOLN
INTRAVENOUS | Status: DC | PRN
  Administered 2021-01-16: 900 mg via INTRAVENOUS

## 2021-01-16 MED ORDER — LIDOCAINE-EPINEPHRINE 1 %-1:100000 IJ SOLN
INTRAMUSCULAR | Status: DC | PRN
  Administered 2021-01-16: 2.5 mL

## 2021-01-16 MED ORDER — CLINDAMYCIN PHOSPHATE 900 MG/50ML IV SOLN
INTRAVENOUS | Status: AC
  Filled 2021-01-16: qty 50

## 2021-01-16 MED ORDER — ACETAMINOPHEN 10 MG/ML IV SOLN
1000.0000 mg | Freq: Once | INTRAVENOUS | Status: DC | PRN
Start: 2021-01-16 — End: 2021-01-16

## 2021-01-16 MED ORDER — ACETAMINOPHEN 160 MG/5ML PO SOLN: 1000.0000 mg | Freq: Once | ORAL | Status: DC | PRN

## 2021-01-16 MED ORDER — LIDOCAINE HCL (CARDIAC) PF 100 MG/5ML IV SOSY
PREFILLED_SYRINGE | INTRAVENOUS | Status: DC | PRN
  Administered 2021-01-16: 30 mg via INTRAVENOUS

## 2021-01-16 MED ORDER — LACTATED RINGERS IV SOLN: INTRAVENOUS | Status: DC

## 2021-01-16 MED ORDER — ACETAMINOPHEN 500 MG PO TABS: 1000.0000 mg | ORAL_TABLET | Freq: Once | ORAL | Status: DC | PRN

## 2021-01-16 MED ORDER — PROPOFOL 500 MG/50ML IV EMUL
INTRAVENOUS | Status: DC | PRN
  Administered 2021-01-16: 50 ug/kg/min via INTRAVENOUS

## 2021-01-16 MED ORDER — LIDOCAINE-EPINEPHRINE 1 %-1:100000 IJ SOLN
INTRAMUSCULAR | Status: AC
  Filled 2021-01-16: qty 1

## 2021-01-16 MED ORDER — FENTANYL CITRATE (PF) 100 MCG/2ML IJ SOLN
INTRAMUSCULAR | Status: DC | PRN
  Administered 2021-01-16: 25 ug via INTRAVENOUS

## 2021-01-16 MED ORDER — OXYCODONE HCL 5 MG PO TABS: 5.0000 mg | ORAL_TABLET | Freq: Once | ORAL | Status: DC | PRN

## 2021-01-16 SURGICAL SUPPLY — 50 items
ADH SKN CLS APL DERMABOND .7 (GAUZE/BANDAGES/DRESSINGS) ×1
APL SWBSTK 6 STRL LF DISP (MISCELLANEOUS) ×1
APPLICATOR COTTON TIP 6 STRL (MISCELLANEOUS) IMPLANT
APPLICATOR COTTON TIP 6IN STRL (MISCELLANEOUS) ×2
BLADE CLIPPER SURG (BLADE) ×1 IMPLANT
BLADE SURG 15 STRL LF DISP TIS (BLADE) ×1 IMPLANT
BLADE SURG 15 STRL SS (BLADE) ×2
CANISTER SUCT 1200ML W/VALVE (MISCELLANEOUS) IMPLANT
CORD BIPOLAR FORCEPS 12FT (ELECTRODE) IMPLANT
COVER BACK TABLE 60X90IN (DRAPES) ×1 IMPLANT
COVER MAYO STAND STRL (DRAPES) ×2 IMPLANT
COVER PROBE W GEL 5X96 (DRAPES) IMPLANT
COVER WAND RF STERILE (DRAPES) IMPLANT
DERMABOND ADVANCED (GAUZE/BANDAGES/DRESSINGS) ×1
DERMABOND ADVANCED .7 DNX12 (GAUZE/BANDAGES/DRESSINGS) ×1 IMPLANT
DRAPE SURG 17X23 STRL (DRAPES) IMPLANT
DRAPE U-SHAPE 76X120 STRL (DRAPES) ×2 IMPLANT
ELECT COATED BLADE 2.86 ST (ELECTRODE) ×2 IMPLANT
ELECT NDL BLADE 2-5/6 (NEEDLE) IMPLANT
ELECT NEEDLE BLADE 2-5/6 (NEEDLE) IMPLANT
ELECT REM PT RETURN 9FT ADLT (ELECTROSURGICAL) ×2
ELECTRODE REM PT RTRN 9FT ADLT (ELECTROSURGICAL) ×1 IMPLANT
GAUZE SPONGE 4X4 12PLY STRL LF (GAUZE/BANDAGES/DRESSINGS) IMPLANT
GLOVE SURG ENC MOIS LTX SZ7.5 (GLOVE) ×3 IMPLANT
GLOVE SURG POLYISO LF SZ6.5 (GLOVE) ×2 IMPLANT
GLOVE SURG UNDER POLY LF SZ6.5 (GLOVE) ×2 IMPLANT
GOWN STRL REUS W/ TWL LRG LVL3 (GOWN DISPOSABLE) ×2 IMPLANT
GOWN STRL REUS W/TWL LRG LVL3 (GOWN DISPOSABLE) ×6
MARKER SKIN DUAL TIP RULER LAB (MISCELLANEOUS) ×1 IMPLANT
NDL HYPO 25X1 1.5 SAFETY (NEEDLE) IMPLANT
NDL PRECISIONGLIDE 27X1.5 (NEEDLE) IMPLANT
NEEDLE HYPO 25X1 1.5 SAFETY (NEEDLE) IMPLANT
NEEDLE PRECISIONGLIDE 27X1.5 (NEEDLE) ×2 IMPLANT
NS IRRIG 1000ML POUR BTL (IV SOLUTION) ×2 IMPLANT
PACK BASIN DAY SURGERY FS (CUSTOM PROCEDURE TRAY) ×2 IMPLANT
PENCIL SMOKE EVACUATOR (MISCELLANEOUS) ×2 IMPLANT
SHEET MEDIUM DRAPE 40X70 STRL (DRAPES) ×2 IMPLANT
SPONGE GAUZE 2X2 8PLY STRL LF (GAUZE/BANDAGES/DRESSINGS) IMPLANT
SUCTION FRAZIER HANDLE 10FR (MISCELLANEOUS)
SUCTION TUBE FRAZIER 10FR DISP (MISCELLANEOUS) IMPLANT
SUT SILK 3 0 TIES 17X18 (SUTURE) ×2
SUT SILK 3-0 18XBRD TIE BLK (SUTURE) ×1 IMPLANT
SUT SILK 4 0 TIES 17X18 (SUTURE) IMPLANT
SUT VIC AB 4-0 P-3 18XBRD (SUTURE) ×1 IMPLANT
SUT VIC AB 4-0 P3 18 (SUTURE) ×2
SWABSTICK POVIDONE IODINE SNGL (MISCELLANEOUS) IMPLANT
SYR CONTROL 10ML LL (SYRINGE) ×2 IMPLANT
TOWEL GREEN STERILE FF (TOWEL DISPOSABLE) ×2 IMPLANT
TRAY DSU PREP LF (CUSTOM PROCEDURE TRAY) ×1 IMPLANT
TUBE CONNECTING 20X1/4 (TUBING) IMPLANT

## 2021-01-16 NOTE — Anesthesia Preprocedure Evaluation (Signed)
Anesthesia Evaluation  Patient identified by MRN, date of birth, ID band Patient awake    Reviewed: Allergy & Precautions, NPO status , Patient's Chart, lab work & pertinent test results  History of Anesthesia Complications Negative for: history of anesthetic complications  Airway Mallampati: I  TM Distance: >3 FB Neck ROM: Full    Dental  (+) Dental Advisory Given, Teeth Intact   Pulmonary neg pulmonary ROS, neg shortness of breath, neg COPD, neg recent URI,  Covid-19 Nucleic Acid Test Results Lab Results      Component                Value               Date                      SARSCOV2NAA              NEGATIVE            01/12/2021                [place]              NEGATIVE            07/17/2020                [place]              NEGATIVE            03/20/2020               breath sounds clear to auscultation       Cardiovascular hypertension, Pt. on medications and Pt. on home beta blockers + dysrhythmias Atrial Fibrillation  Rhythm:Regular     Neuro/Psych  Headaches, neg Seizures PSYCHIATRIC DISORDERS Anxiety    GI/Hepatic hiatal hernia, GERD  Medicated and Controlled,  Endo/Other  diabetes  Renal/GU Renal diseaseLab Results      Component                Value               Date                      CREATININE               1.05 (H)            01/02/2021                Musculoskeletal  (+) Arthritis ,   Abdominal   Peds  Hematology Lab Results      Component                Value               Date                      WBC                      8.6                 09/27/2020                HGB                      12.1  09/27/2020                HCT                      36.1                09/27/2020                MCV                      93                  09/27/2020                PLT                      317                 09/27/2020             eliquis   Anesthesia Other  Findings   Reproductive/Obstetrics                             Anesthesia Physical Anesthesia Plan  ASA: II  Anesthesia Plan: MAC   Post-op Pain Management:    Induction: Intravenous  PONV Risk Score and Plan: 2 and Propofol infusion and Treatment may vary due to age or medical condition  Airway Management Planned: Nasal Cannula  Additional Equipment: None  Intra-op Plan:   Post-operative Plan:   Informed Consent: I have reviewed the patients History and Physical, chart, labs and discussed the procedure including the risks, benefits and alternatives for the proposed anesthesia with the patient or authorized representative who has indicated his/her understanding and acceptance.     Dental advisory given  Plan Discussed with: CRNA and Surgeon  Anesthesia Plan Comments:         Anesthesia Quick Evaluation

## 2021-01-16 NOTE — H&P (Signed)
Cc: Chronic left temporal headache  HPI: The patient is an 80 year old female who returns today for follow-up evaluation. of her chronic left temporal headache, left ear pain and nasal drainage.  The patient was last seen 3 weeks ago.  At that time, she was noted to have referred left otalgia.  She was also noted to have chronic rhinitis, nasal mucosal congestion and bilateral inferior turbinate hypertrophy.  The possibility of giant cell arteritis/temporal arteritis causing her left temporal headache was discussed.  The patient was placed on high dose Prednisone taper for 12 days.  She was also placed on Atrovent to treat her nasal drainage.  According to the patient, her headache resolved while she was on Prednisone.  Since she completed her steroid, the headache has recurred.  Her nasal drainage is currently under control with the use of Atrovent.  She denies any significant nasal symptoms today.  The patient has a history of recurrent atrial fibrillation.  She was cardioverted twice.  She is currently in sinus rhythm.  She is on Eliquis.  Her sedimentation rate was elevated at 50 ml per hour.  Her C-reactive protein is normal.    Exam: General: Communicates without difficulty, well nourished, no acute distress. Head: Normocephalic, no evidence injury, no tenderness, facial buttresses intact without stepoff. Face/sinus: No tenderness to palpation and percussion. Facial movement is normal and symmetric. Eyes: PERRL, EOMI. No scleral icterus, conjunctivae clear. Neuro: CN II exam reveals vision grossly intact.  No nystagmus at any point of gaze. Ears: Auricles well formed without lesions.  Ear canals are intact without mass or lesion.  No erythema or edema is appreciated.  The TMs are intact without fluid. Nose: External evaluation reveals normal support and skin without lesions.  Dorsum is intact.  Anterior rhinoscopy reveals congested mucosa over anterior aspect of inferior turbinates and intact septum.   Oral:  Oral cavity and oropharynx are intact, symmetric, without erythema or edema.  Mucosa is moist without lesions. Neck: Full range of motion without pain.  There is no significant lymphadenopathy.  No masses palpable.  Thyroid bed within normal limits to palpation.  Parotid glands and submandibular glands equal bilaterally without mass.  Trachea is midline. Neuro:  CN 2-12 grossly intact. Gait normal.   Assessment  1.  The patient's ear canals, tympanic membranes and middle ear spaces are normal.  2.  Persistent left referred otalgia and left temporal headache.  Her pain responded to Prednisone temporarily.  The differential diagnoses include temporal arteritis versus other neurogenic headaches.  3.  The patient's chronic rhinitis and nasal mucosal congestion are currently under control with the use of Atrovent nasal spray.    Plan  1.  The physical exam findings are reviewed with the patient.  2.  The pathophysiology of giant cell arteritis/temporal arteritis is discussed with the patient.  Since the sed rate is also elevated, the patient may benefit from undergoing left temporal artery biopsy to evaluate for possible temporal arteritis.  3.  The patient should continue with her Atrovent nasal spray prn nasal drainage.  4.  The risks, benefits, alternatives and details of the temporal artery biopsy procedure are discussed.  Questions are invited and answered.  5.  The patient would like to proceed with the biopsy procedure.  6.  We will schedule the procedure under local anesthesia with IV sedation.

## 2021-01-16 NOTE — Op Note (Signed)
DATE OF PROCEDURE:  01/16/2021                              OPERATIVE REPORT  SURGEON:  Leta Baptist, MD  PREOPERATIVE DIAGNOSES: 1. Left temporal headaches, possible temporal arteritis.  POSTOPERATIVE DIAGNOSES: 1. Left temporal headaches, possible temporal arteritis.  PROCEDURE PERFORMED:  Left temporal artery biopsy.  ANESTHESIA: Local anesthesia with IV sedation  COMPLICATIONS:  None.  ESTIMATED BLOOD LOSS:  Minimal.  INDICATION FOR PROCEDURE:  Leslie Bryant is a 80 y.o. female with a history of chronic left temporal headache. The patient was placed on high dose Prednisone taper for 12 days. According to the patient, her headache resolved while she was on Prednisone.  Since she completed her steroid, the headache has recurred. Her sedimentation rate was also elevated. Her history was suggestive of temporal arteritis.  Based on the above findings, the decision was made for the patient to undergo the above stated procedure. The risks, benefits, alternatives, and details of the procedure were discussed with the patient.  Questions were invited and answered.  Informed consent was obtained.  DESCRIPTION:  The patient was taken to the operating room and placed supine on the operating table.  IV sedation was administered by the anesthesiologist.  The patient was positioned and prepped and draped in a standard fashion for left temporal artery biopsy.  The location of the left temporal artery was identified using a Doppler probe.  1% lidocaine with 1-100,000 epinephrine was infiltrated locally.  An incision was made with a 15 blade.  The incision was carried down to the level of the temporal artery.  Sharp dissection was carried out to free the artery from the surrounding soft tissue.  A 1 cm segment of the temporal artery was excised.  The specimen was sent to the pathology department for permanent histologic identification.  The surgical site was copiously irrigated.  It was closed in layers with 4-0  Vicryl and Dermabond.  The care of the patient was turned over to the anesthesiologist.  The patient was awakened from her sedation without difficulty.  The patient was transferred to the recovery room in good condition.  OPERATIVE FINDINGS: The left temporal artery was identified and biopsied.  SPECIMEN: Left temporal artery.  FOLLOWUP CARE:  The patient will be discharged home once awake and alert. The patient will follow up in my office in 1 week.  Leslie Bryant 01/16/2021 8:31 AM

## 2021-01-16 NOTE — Anesthesia Procedure Notes (Signed)
Procedure Name: MAC Date/Time: 01/16/2021 7:34 AM Performed by: Signe Colt, CRNA Pre-anesthesia Checklist: Patient identified, Emergency Drugs available, Suction available, Patient being monitored and Timeout performed Patient Re-evaluated:Patient Re-evaluated prior to induction Oxygen Delivery Method: Simple face mask

## 2021-01-16 NOTE — Discharge Instructions (Signed)
  Post Anesthesia Home Care Instructions  Activity: Get plenty of rest for the remainder of the day. A responsible individual must stay with you for 24 hours following the procedure.  For the next 24 hours, DO NOT: -Drive a car -Paediatric nurse -Drink alcoholic beverages -Take any medication unless instructed by your physician -Make any legal decisions or sign important papers.  Meals: Start with liquid foods such as gelatin or soup. Progress to regular foods as tolerated. Avoid greasy, spicy, heavy foods. If nausea and/or vomiting occur, drink only clear liquids until the nausea and/or vomiting subsides. Call your physician if vomiting continues.  Special Instructions/Symptoms: Your throat may feel dry or sore from the anesthesia or the breathing tube placed in your throat during surgery. If this causes discomfort, gargle with warm salt water. The discomfort should disappear within 24 hours.  If you had a scopolamine patch placed behind your ear for the management of post- operative nausea and/or vomiting:  1. The medication in the patch is effective for 72 hours, after which it should be removed.  Wrap patch in a tissue and discard in the trash. Wash hands thoroughly with soap and water. 2. You may remove the patch earlier than 72 hours if you experience unpleasant side effects which may include dry mouth, dizziness or visual disturbances. 3. Avoid touching the patch. Wash your hands with soap and water after contact with the patch.    --------------  The patient may resume all her previous activities and diet.  No postop restriction.

## 2021-01-16 NOTE — Transfer of Care (Signed)
Immediate Anesthesia Transfer of Care Note  Patient: Leslie Bryant  Procedure(s) Performed: BIOPSY TEMPORAL ARTERY (Left Face)  Patient Location: PACU  Anesthesia Type:MAC  Level of Consciousness: awake, alert , oriented and patient cooperative  Airway & Oxygen Therapy: Patient Spontanous Breathing and Patient connected to face mask oxygen  Post-op Assessment: Report given to RN and Post -op Vital signs reviewed and stable  Post vital signs: Reviewed and stable  Last Vitals:  Vitals Value Taken Time  BP    Temp    Pulse    Resp    SpO2      Last Pain:  Vitals:   01/16/21 0640  TempSrc: Oral  PainSc: 0-No pain      Patients Stated Pain Goal: 1 (16/60/60 0459)  Complications: No complications documented.

## 2021-01-17 ENCOUNTER — Encounter (HOSPITAL_BASED_OUTPATIENT_CLINIC_OR_DEPARTMENT_OTHER): Payer: Self-pay | Admitting: Otolaryngology

## 2021-01-17 LAB — SURGICAL PATHOLOGY

## 2021-01-22 DIAGNOSIS — R519 Headache, unspecified: Secondary | ICD-10-CM | POA: Diagnosis not present

## 2021-01-22 DIAGNOSIS — M316 Other giant cell arteritis: Secondary | ICD-10-CM | POA: Diagnosis not present

## 2021-01-22 DIAGNOSIS — M8589 Other specified disorders of bone density and structure, multiple sites: Secondary | ICD-10-CM | POA: Diagnosis not present

## 2021-01-22 DIAGNOSIS — R700 Elevated erythrocyte sedimentation rate: Secondary | ICD-10-CM | POA: Diagnosis not present

## 2021-01-22 DIAGNOSIS — Z79899 Other long term (current) drug therapy: Secondary | ICD-10-CM | POA: Diagnosis not present

## 2021-01-22 NOTE — Anesthesia Postprocedure Evaluation (Signed)
Anesthesia Post Note  Patient: Leslie Bryant  Procedure(s) Performed: BIOPSY TEMPORAL ARTERY (Left Face)     Patient location during evaluation: PACU Anesthesia Type: MAC Level of consciousness: awake and alert Pain management: pain level controlled Vital Signs Assessment: post-procedure vital signs reviewed and stable Respiratory status: spontaneous breathing, nonlabored ventilation, respiratory function stable and patient connected to nasal cannula oxygen Cardiovascular status: stable and blood pressure returned to baseline Postop Assessment: no apparent nausea or vomiting Anesthetic complications: no   No complications documented.  Last Vitals:  Vitals:   01/16/21 0824 01/16/21 0855  BP: 129/78 138/69  Pulse: 65 61  Resp: 17 15  Temp: 36.4 C 36.6 C  SpO2: 100% 98%    Last Pain:  Vitals:   01/17/21 1014  TempSrc:   PainSc: 2                  Cordella Nyquist

## 2021-01-26 ENCOUNTER — Telehealth: Payer: Self-pay | Admitting: Internal Medicine

## 2021-01-26 NOTE — Telephone Encounter (Signed)
[facility] [address] [place], [place] - [address] Phone:  [phone]  Fax:  [phone]     Patient is having a hard time sleeping and wanted to know if Dr. Jenny Reichmann would send something in for her. Denies appointment

## 2021-01-27 MED ORDER — ZOLPIDEM TARTRATE 5 MG PO TABS: 5.0000 mg | ORAL_TABLET | Freq: Every evening | ORAL | 2 refills | Status: DC | PRN

## 2021-01-27 NOTE — Telephone Encounter (Signed)
Ok this is done 

## 2021-02-01 ENCOUNTER — Other Ambulatory Visit: Payer: Self-pay

## 2021-02-01 ENCOUNTER — Encounter: Payer: Self-pay | Admitting: Internal Medicine

## 2021-02-01 ENCOUNTER — Ambulatory Visit (INDEPENDENT_AMBULATORY_CARE_PROVIDER_SITE_OTHER): Payer: Medicare Other | Admitting: Internal Medicine

## 2021-02-01 ENCOUNTER — Ambulatory Visit (INDEPENDENT_AMBULATORY_CARE_PROVIDER_SITE_OTHER)
Admission: RE | Admit: 2021-02-01 | Discharge: 2021-02-01 | Disposition: A | Discharge status: Home / Self Care | Payer: Medicare Other | Source: Ambulatory Visit | Attending: Internal Medicine | Admitting: Internal Medicine

## 2021-02-01 ENCOUNTER — Other Ambulatory Visit: Payer: Self-pay | Admitting: Internal Medicine

## 2021-02-01 VITALS — BP 138/78 | HR 70 | Ht 61.0 in | Wt 166.0 lb

## 2021-02-01 DIAGNOSIS — N289 Disorder of kidney and ureter, unspecified: Secondary | ICD-10-CM

## 2021-02-01 DIAGNOSIS — M316 Other giant cell arteritis: Secondary | ICD-10-CM

## 2021-02-01 DIAGNOSIS — E782 Mixed hyperlipidemia: Secondary | ICD-10-CM

## 2021-02-01 DIAGNOSIS — D72829 Elevated white blood cell count, unspecified: Secondary | ICD-10-CM | POA: Diagnosis not present

## 2021-02-01 DIAGNOSIS — I10 Essential (primary) hypertension: Secondary | ICD-10-CM

## 2021-02-01 DIAGNOSIS — R7989 Other specified abnormal findings of blood chemistry: Secondary | ICD-10-CM | POA: Diagnosis not present

## 2021-02-01 DIAGNOSIS — E1165 Type 2 diabetes mellitus with hyperglycemia: Secondary | ICD-10-CM

## 2021-02-01 LAB — BASIC METABOLIC PANEL
BUN: 29 mg/dL — ABNORMAL HIGH (ref 6–23)
CO2: 31 mEq/L (ref 19–32)
Calcium: 9.5 mg/dL (ref 8.4–10.5)
Chloride: 95 mEq/L — ABNORMAL LOW (ref 96–112)
Creatinine, Ser: 1.12 mg/dL (ref 0.40–1.20)
GFR: 46.42 mL/min — ABNORMAL LOW (ref >60.00)
Glucose, Bld: 96 mg/dL (ref 70–99)
Potassium: 4 mEq/L (ref 3.5–5.1)
Sodium: 134 mEq/L — ABNORMAL LOW (ref 135–145)

## 2021-02-01 LAB — CBC WITH DIFFERENTIAL/PLATELET
Basophils Absolute: 0 10*3/uL (ref 0.0–0.1)
Basophils Relative: 0.1 % (ref 0.0–3.0)
Eosinophils Absolute: 0.1 10*3/uL (ref 0.0–0.7)
Eosinophils Relative: 0.3 % (ref 0.0–5.0)
HCT: 38.1 % (ref 36.0–46.0)
Hemoglobin: 12.7 g/dL (ref 12.0–15.0)
Lymphocytes Relative: 19.3 % (ref 12.0–46.0)
Lymphs Abs: 3.4 10*3/uL (ref 0.7–4.0)
MCHC: 33.3 g/dL (ref 30.0–36.0)
MCV: 94.7 fl (ref 78.0–100.0)
Monocytes Absolute: 1.6 10*3/uL — ABNORMAL HIGH (ref 0.1–1.0)
Monocytes Relative: 9.2 % (ref 3.0–12.0)
Neutro Abs: 12.6 10*3/uL — ABNORMAL HIGH (ref 1.4–7.7)
Neutrophils Relative %: 71.1 % (ref 43.0–77.0)
Platelets: 277 10*3/uL (ref 150.0–400.0)
RBC: 4.03 Mil/uL (ref 3.87–5.11)
RDW: 12.9 % (ref 11.5–15.5)
WBC: 17.7 10*3/uL — ABNORMAL HIGH (ref 4.0–10.5)

## 2021-02-01 LAB — HEPATIC FUNCTION PANEL
ALT: 45 U/L — ABNORMAL HIGH (ref 0–35)
AST: 23 U/L (ref 0–37)
Albumin: 4 g/dL (ref 3.5–5.2)
Alkaline Phosphatase: 45 U/L (ref 39–117)
Bilirubin, Direct: 0.1 mg/dL (ref 0.0–0.3)
Total Bilirubin: 0.5 mg/dL (ref 0.2–1.2)
Total Protein: 7.2 g/dL (ref 6.0–8.3)

## 2021-02-01 LAB — URINALYSIS, ROUTINE W REFLEX MICROSCOPIC
Bilirubin Urine: NEGATIVE
Hgb urine dipstick: NEGATIVE
Ketones, ur: NEGATIVE
Nitrite: NEGATIVE
RBC / HPF: NONE SEEN (ref 0–?)
Specific Gravity, Urine: <=1.005 — AB (ref 1.000–1.030)
Total Protein, Urine: NEGATIVE
Urine Glucose: NEGATIVE
Urobilinogen, UA: 0.2 (ref 0.0–1.0)
pH: 7 (ref 5.0–8.0)

## 2021-02-01 LAB — HEMOGLOBIN A1C: Hgb A1c MFr Bld: 7.1 % — ABNORMAL HIGH (ref 4.6–6.5)

## 2021-02-01 NOTE — Progress Notes (Addendum)
Patient ID: Leslie Bryant, female   DOB: 09-06-1941, 80 y.o.   MRN: [id number]        Chief Complaint: follow up recent abnormal lab tests per rheumatology       HPI:  Leslie Bryant is a 80 y.o. female here with above, reports recent temporal biopsy c/w temporal arteritis, just recently started prednisone 60 mg per day with plan for this high dose for 1 mo, f/u lab at about 1 wk has indicated leukocytosis, elevated LFTs, and abnormal creatinine, the interpretation of which is unclear since rheum apparently had no baseline to compare and referred here for this.   Pt denies fever, wt loss, night sweats, loss of appetite, or other constitutional symptoms  No cough and Pt denies chest pain, increased sob or doe, wheezing, orthopnea, PND, increased LE swelling, palpitations, dizziness or syncope.  Denies urinary symptoms such as dysuria, frequency, urgency, flank pain, hematuria or n/v, fever, chills.  Denies worsening reflux, abd pain, dysphagia, n/v, bowel change or blood.  Denies urinary symptoms such as dysuria, frequency, urgency, flank pain, hematuria or n/v, fever, chills.         Wt Readings from Last 3 Encounters:  02/01/21 166 lb (75.3 kg)  01/16/21 167 lb 1.7 oz (75.8 kg)  01/02/21 167 lb (75.8 kg)   BP Readings from Last 3 Encounters:  02/01/21 138/78  01/16/21 138/69  01/02/21 120/68         Past Medical History:  Diagnosis Date  . Acute meniscal tear of knee LEFT KNEE  . Allergic rhinitis   . Allergy   . Arthritis    "knees" (02/19/2017)  . Atrial fibrillation ([place])   . Breast cancer, right breast ([place]) 1986  . 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 Diverticulitis of large intestine with perforation 11/02/2011   Microperforation probably related to nut and popcorn consumption.  Resolved by CT scan Recurrent episode clinical dx 07/2012   . Diverticulosis of colon    sigmoid  . GERD (gastroesophageal reflux disease)   . H/O hiatal hernia   . Headache   . History of kidney stones     "passed it" (02/19/2017)  . Hypercholesterolemia    history  . Left knee pain    occasionally  . Palpitations IRREGULAR HEARTBEAT--  CONTROLLED W/  BETA BLOCKER  . Pneumonia 2017   "walking pneumonia" (02/19/2017)  . Swelling of left knee joint    Past Surgical History:  Procedure Laterality Date  . ARTERY BIOPSY Left 01/16/2021   Procedure: BIOPSY TEMPORAL ARTERY;  Surgeon: Leta Baptist, MD;  Location: [place];  Service: ENT;  Laterality: Left;  . BREAST BIOPSY Right 1986  . BREAST BIOPSY Left   . BREAST RECONSTRUCTION Right 1987   W/ IMPLANTS  . CARDIAC CATHETERIZATION  12-22-2000  DR BRACKBILL   NORMAL LVF/ NORMAL CORONARY ARTERIES  . CARDIOVERSION N/A 03/23/2020   Procedure: CARDIOVERSION;  Surgeon: Donato Heinz, MD;  Location: [facility] END[facility]COPY;  Service: Cardiovascular;  Laterality: N/A;  . CARDIOVERSION N/A 07/20/2020   Procedure: CARDIOVERSION;  Surgeon: Buford Dresser, MD;  Location: [place];  Service: Cardiovascular;  Laterality: N/A;  . CHOLECYSTECTOMY OPEN  1988  . CHONDROPLASTY  09/08/2012   Procedure: CHONDROPLASTY;  Surgeon: Tobi Bastos, MD;  Location: [facility];  Service: Orthopedics;  Laterality: Left;  . COLONOSCOPY     last colon 05-03-2009 (02/19/2017)  . COLONOSCOPY W/ BIOPSIES AND POLYPECTOMY    .  KNEE ARTHROSCOPY WITH MEDIAL MENISECTOMY  09/08/2012   Procedure: KNEE ARTHROSCOPY WITH MEDIAL MENISECTOMY;  Surgeon: Tobi Bastos, MD;  Location: [place];  Service: Orthopedics;  Laterality: Left;  lateral tibial plateau,   . MASTECTOMY Right 1986   W/ MULTIPLE NODE DISSECTION  . REDUCTION MAMMAPLASTY Left 1987  . TOTAL KNEE ARTHROPLASTY Left 07/21/2013   Procedure: TOTAL KNEE ARTHROPLASTY;  Surgeon: Newt Minion, MD;  Location: [place];  Service: Orthopedics;  Laterality: Left;  Left Total Knee Arthroplasty  . TOTAL KNEE ARTHROPLASTY Right 02/19/2017   Procedure: RIGHT TOTAL KNEE ARTHROPLASTY;   Surgeon: Newt Minion, MD;  Location: [place];  Service: Orthopedics;  Laterality: Right;  Marland Kitchen VAGINAL HYSTERECTOMY  1966    reports that she has never smoked. She has never used smokeless tobacco. She reports previous alcohol use of about 1.0 standard drink of alcohol per week. She reports that she does not use drugs. family history includes Brain cancer in her sister; Cancer in her brother, sister, and sister; Colon cancer in her brother; Dementia in her maternal grandfather and sister; Esophageal cancer in her brother; Heart attack in her father; Heart disease in her daughter; Heart failure in her mother; Leukemia in her brother; Lung cancer in her sister; Rectal cancer in her brother. Allergies  Allergen Reactions  . Crestor [Rosuvastatin Calcium] Other (See Comments)    Shoulder pain  . Lipitor [Atorvastatin Calcium] Other (See Comments)    Myalgias   . Pravachol [Pravastatin] Other (See Comments)    myalgias  . Penicillins Itching    Has patient had a PCN reaction causing immediate rash, facial/tongue/throat swelling, SOB or lightheadedness with hypotension: No Has patient had a PCN reaction causing severe rash involving mucus membranes or skin necrosis: No Has patient had a PCN reaction that required hospitalization No Has patient had a PCN reaction occurring within the last 10 years: No If all of the above answers are "NO", then may proceed with Cephalosporin use.    Current Outpatient Medications on File Prior to Visit  Medication Sig Dispense Refill  . amiodarone (PACERONE) 200 MG tablet Take 1 tablet (200 mg total) by mouth daily. 90 tablet 3  . apixaban (ELIQUIS) 5 MG TABS tablet Take 1 tablet (5 mg total) by mouth 2 (two) times daily. 60 tablet 11  . calcium carbonate (OS-CAL - DOSED IN MG OF ELEMENTAL CALCIUM) 1250 (500 Ca) MG tablet Take 1 tablet by mouth daily with breakfast.    . co-enzyme Q-10 30 MG capsule Take 30 mg by mouth 2 (two) times daily.    . famotidine (PEPCID)  40 MG tablet Take 40 mg by mouth at bedtime.    . furosemide (LASIX) 40 MG tablet Take 1 tablet (40 mg total) by mouth daily. 90 tablet 3  . hyoscyamine (LEVSIN SL) 0.125 MG SL tablet DISSOLVE 1 TABLET IN MOUTH THREE TIMES DAILY AS NEEDED FOR ABDOMINAL CRAMPS 30 tablet 1  . ipratropium (ATROVENT) 0.06 % nasal spray as needed.    . metoprolol tartrate (LOPRESSOR) 50 MG tablet TAKE 1 & 1/2 (ONE & ONE-HALF) TABLETS BY MOUTH TWICE DAILY 270 tablet 3  . Multiple Vitamins-Minerals (CENTRUM SILVER PO) Take 1 tablet by mouth every morning. Align    . omeprazole (PRILOSEC) 40 MG capsule TAKE 1 CAPSULE BY MOUTH TWICE DAILY BEFORE MEAL(S) 180 capsule 1  . predniSONE (DELTASONE) 20 MG tablet     . zolpidem (AMBIEN) 5 MG tablet Take 1 tablet (5 mg total)  by mouth at bedtime as needed for sleep. 30 tablet 2  . alendronate (FOSAMAX) 70 MG tablet Take 70 mg by mouth once a week.     No current facility-administered medications on file prior to visit.        ROS:  All others reviewed and negative.  Objective        PE:  BP 138/78 (BP Location: Left Arm, Patient Position: Sitting, Cuff Size: Large)   Pulse 70   Ht 5\' 1"  (1.549 m)   Wt 166 lb (75.3 kg)   SpO2 97%   BMI 31.37 kg/m                 Constitutional: Pt appears in NAD               HENT: Head: NCAT.                Right Ear: External ear normal.                 Left Ear: External ear normal.                Eyes: . Pupils are equal, round, and reactive to light. Conjunctivae and EOM are normal               Nose: without d/c or deformity               Neck: Neck supple. Gross normal ROM               Cardiovascular: Normal rate and regular rhythm.                 Pulmonary/Chest: Effort normal and breath sounds without rales or wheezing.                Abd:  Soft, NT, ND, + BS, no organomegaly               Neurological: Pt is alert. At baseline orientation, motor grossly intact               Skin: Skin is warm. No rashes, no other new  lesions, LE edema - none               Psychiatric: Pt behavior is normal without agitation   Micro: none  Cardiac tracings I have personally interpreted today:  none  Pertinent Radiological findings (summarize): none   Lab Results  Component Value Date   WBC 17.7 (H) 02/01/2021   HGB 12.7 02/01/2021   HCT 38.1 02/01/2021   PLT 277.0 02/01/2021   GLUCOSE 96 02/01/2021   CHOL 163 05/03/2020   TRIG 228.0 (H) 05/03/2020   HDL 43.70 05/03/2020   LDLDIRECT 104.0 05/03/2020   LDLCALC 116 (H) 11/09/2019   ALT 45 (H) 02/01/2021   AST 23 02/01/2021   NA 134 (L) 02/01/2021   K 4.0 02/01/2021   CL 95 (L) 02/01/2021   CREATININE 1.12 02/01/2021   BUN 29 (H) 02/01/2021   CO2 31 02/01/2021   TSH 1.690 01/02/2021   INR 1.01 07/13/2013   HGBA1C 7.1 (H) 02/01/2021   MICROALBUR <0.7 05/03/2020   Assessment/Plan:  Leslie Bryant is a 80 y.o. White or Caucasian [1] female with  has a past medical history of Acute meniscal tear of knee (LEFT KNEE), Allergic rhinitis, Allergy, Arthritis, Atrial fibrillation ([place]), Breast cancer, right breast ([place]) (1986), Bright's disease, Diverticulitis of large intestine with perforation (11/02/2011), Diverticulosis of colon, GERD (gastroesophageal reflux  disease), H/O hiatal hernia, Headache, History of kidney stones, Hypercholesterolemia, Left knee pain, Palpitations (IRREGULAR HEARTBEAT--  CONTROLLED W/  BETA BLOCKER), Pneumonia (2017), and Swelling of left knee joint.  Renal insufficiency Per recent labs, asympt, will check f/u urine studies and bun/cr and further interpret based on prior labs  Leukocytosis Per recent labs by report, asympt, afeb, will check cbc and further interpret based on prior labs, most likely related to prednisone, also for cxr and ua with labs   Elevated LFTs Per recent labs, asympt, will check f/u cmp, and further interpret based on prior labs, consider abd u/s and/or gi referral   Hyperlipidemia Lab Results  Component Value  Date   LDLCALC 116 (H) 11/09/2019   Stable, pt to continue current zetia and low chol diet  Essential hypertension BP Readings from Last 3 Encounters:  02/01/21 138/78  01/16/21 138/69  01/02/21 120/68   Stable, pt to continue medical treatment lopressor   Diabetes [facility]) Lab Results  Component Value Date   HGBA1C 7.1 (H) 02/01/2021   Stable, pt to continue current medical treatment - diet and wt control   Temporal arteritis ([place]) To continue Prednisone 60 and f/u rheum as planned  Followup: Return in about 6 months (around 08/03/2021).  [name], MD 02/04/2021 1:34 PM [facility]

## 2021-02-01 NOTE — Patient Instructions (Signed)
Please continue all other medications as before, and refills have been done if requested.  Please have the pharmacy call with any other refills you may need.  Please continue your efforts at being more active, low cholesterol diet, and weight control.  You are otherwise up to date with prevention measures today.  Please keep your appointments with your specialists as you may have planned  Please go to the XRAY Department in the first floor for the x-ray testing  Please go to the LAB at the blood drawing area for the tests to be done  You will be contacted by phone if any changes need to be made immediately.  Otherwise, you will receive a letter about your results with an explanation, but please check with MyChart first.  Please remember to sign up for MyChart if you have not done so, as this will be important to you in the future with finding out test results, communicating by private email, and scheduling acute appointments online when needed.  Please make an Appointment to return in 6 months, or sooner if needed 

## 2021-02-04 ENCOUNTER — Encounter: Payer: Self-pay | Admitting: Internal Medicine

## 2021-02-04 DIAGNOSIS — M316 Other giant cell arteritis: Secondary | ICD-10-CM | POA: Insufficient documentation

## 2021-02-04 NOTE — Assessment & Plan Note (Signed)
BP Readings from Last 3 Encounters:  02/01/21 138/78  01/16/21 138/69  01/02/21 120/68   Stable, pt to continue medical treatment lopressor

## 2021-02-04 NOTE — Assessment & Plan Note (Signed)
Lab Results  Component Value Date   HGBA1C 7.1 (H) 02/01/2021   Stable, pt to continue current medical treatment - diet and wt control

## 2021-02-04 NOTE — Assessment & Plan Note (Signed)
To continue Prednisone 60 and f/u rheum as planned

## 2021-02-04 NOTE — Assessment & Plan Note (Signed)
Lab Results  Component Value Date   LDLCALC 116 (H) 11/09/2019   Stable, pt to continue current zetia and low chol diet

## 2021-02-04 NOTE — Assessment & Plan Note (Addendum)
Per recent labs, asympt, will check f/u cmp, and further interpret based on prior labs, consider abd u/s and/or gi referral

## 2021-02-04 NOTE — Assessment & Plan Note (Signed)
Per recent labs, asympt, will check f/u urine studies and bun/cr and further interpret based on prior labs

## 2021-02-04 NOTE — Assessment & Plan Note (Signed)
Per recent labs by report, asympt, afeb, will check cbc and further interpret based on prior labs, most likely related to prednisone, also for cxr and ua with labs

## 2021-02-07 ENCOUNTER — Telehealth: Payer: Self-pay | Admitting: Internal Medicine

## 2021-02-07 NOTE — Telephone Encounter (Signed)
   Patient seeking advice for OTC medications that can be taken for back pain.   Seeking advice, please call

## 2021-02-07 NOTE — Telephone Encounter (Signed)
Given her age and prednisone use and mild low kidney function, we are supposed to say to stay AWAY from advil and alleve type medicaitons  OK for Tylenol, topical Voltaren gel, TENS unit use, or Salon Paz type pain patch  thanks

## 2021-02-07 NOTE — Telephone Encounter (Signed)
Patient notified and verbalizes understanding.

## 2021-02-10 ENCOUNTER — Other Ambulatory Visit: Payer: Self-pay | Admitting: Internal Medicine

## 2021-02-12 NOTE — Telephone Encounter (Signed)
Prescription refill request for Eliquis received. Indication:atrial fib Last office visit:3/22 tillery Scr:1.1 3/22 Age: 80 Weight:75.3 kg  Prescription refilled

## 2021-02-13 ENCOUNTER — Telehealth: Payer: Self-pay | Admitting: Internal Medicine

## 2021-02-13 NOTE — Telephone Encounter (Signed)
   Patient seeking advice, she states the zolpidem (AMBIEN) 5 MG tablet is not helping her sleep   Please advise

## 2021-02-13 NOTE — Telephone Encounter (Signed)
Patient has been advised of message as seen below by provider. Patient verbalizes understanding and will notify us if this works or doesn't.

## 2021-02-13 NOTE — Telephone Encounter (Signed)
Ok to try 2 of the 5 mg ambien to see if works better, then let us know either way

## 2021-02-20 ENCOUNTER — Other Ambulatory Visit: Payer: Self-pay | Admitting: Internal Medicine

## 2021-02-20 MED ORDER — ZOLPIDEM TARTRATE 5 MG PO TABS: ORAL_TABLET | ORAL | 2 refills | Status: DC

## 2021-02-20 NOTE — Telephone Encounter (Signed)
Ok rx changed and done erx

## 2021-02-20 NOTE — Telephone Encounter (Signed)
  Re: zolpidem (AMBIEN) 5 MG tablet  Patient requesting new order be sent to Pharmacy. She has been taking 2 tablets (10mg ) no medication remaining  [place], [place]

## 2021-02-27 DIAGNOSIS — M316 Other giant cell arteritis: Secondary | ICD-10-CM | POA: Diagnosis not present

## 2021-02-27 DIAGNOSIS — R700 Elevated erythrocyte sedimentation rate: Secondary | ICD-10-CM | POA: Diagnosis not present

## 2021-02-27 DIAGNOSIS — M810 Age-related osteoporosis without current pathological fracture: Secondary | ICD-10-CM | POA: Diagnosis not present

## 2021-02-27 DIAGNOSIS — Z79899 Other long term (current) drug therapy: Secondary | ICD-10-CM | POA: Diagnosis not present

## 2021-02-27 DIAGNOSIS — R519 Headache, unspecified: Secondary | ICD-10-CM | POA: Diagnosis not present

## 2021-03-05 ENCOUNTER — Other Ambulatory Visit: Payer: Self-pay | Admitting: Internal Medicine

## 2021-03-07 DIAGNOSIS — H2513 Age-related nuclear cataract, bilateral: Secondary | ICD-10-CM | POA: Diagnosis not present

## 2021-03-07 DIAGNOSIS — M316 Other giant cell arteritis: Secondary | ICD-10-CM | POA: Diagnosis not present

## 2021-03-07 DIAGNOSIS — H43813 Vitreous degeneration, bilateral: Secondary | ICD-10-CM | POA: Diagnosis not present

## 2021-03-07 DIAGNOSIS — H11122 Conjunctival concretions, left eye: Secondary | ICD-10-CM | POA: Diagnosis not present

## 2021-03-08 DIAGNOSIS — R748 Abnormal levels of other serum enzymes: Secondary | ICD-10-CM | POA: Diagnosis not present

## 2021-03-08 DIAGNOSIS — R945 Abnormal results of liver function studies: Secondary | ICD-10-CM | POA: Diagnosis not present

## 2021-03-21 ENCOUNTER — Other Ambulatory Visit: Payer: Self-pay | Admitting: Internal Medicine

## 2021-03-21 NOTE — Telephone Encounter (Signed)
Ok to refill with my name   Please refill as per office routine med refill policy (all routine meds refilled for 3 mo or monthly per pt preference up to one year from last visit, then month to month grace period for 3 mo, then further med refills will have to be denied)

## 2021-03-22 ENCOUNTER — Telehealth: Payer: Self-pay | Admitting: Internal Medicine

## 2021-03-22 NOTE — Telephone Encounter (Signed)
Returned the call to the patient. She stated that she has been having increased shortness of breath on exertion and has been feeling more tired lately.  She denies shortness of breath at rest.   She stated that she monitors her rhythm with her watch and she has been staying in normal rhythm.  Her current weight is 167 pounds. She has not been gaining weight and denies edema. She did state that she is currently on prednisone 40 mg daily for arthritis. She does have some abdomen swelling but feels like it could be from the Prednisone.  She has been trying to work out three times a week by walking on a treadmill. She stated that most times she can walk for 30 minutes. He heart rate is in the 90's when she works outs.   She has a follow up on 6/17 with Dr. Caryl Comes.

## 2021-03-22 NOTE — Telephone Encounter (Signed)
Pt c/o Shortness Of Breath: STAT if SOB developed within the last 24 hours or pt is noticeably SOB on the phone  1. Are you currently SOB (can you hear that pt is SOB on the phone)? yes  2. How long have you been experiencing SOB? For last few weeks  3. Are you SOB when sitting or when up moving around? When moving around  4. Are you currently experiencing any other symptoms? Tired     Pt c/o swelling: STAT is pt has developed SOB within 24 hours  1) How much weight have you gained and in what time span? Not sure 171 now but weighs around 167  2) If swelling, where is the swelling located? Face and stomach   3) Are you currently taking a fluid pill? Yes everyday   4) Are you currently SOB? Yes   5) Do you have a log of your daily weights (if so, list)? not  6) Have you gained 3 pounds in a day or 5 pounds in a week? Yes   7) Have you traveled recently? No

## 2021-03-23 NOTE — Telephone Encounter (Signed)
Myoview last year was negative - does not sound like afib. Dyspnea could be cardiac - would follow-up as scheduled with Dr. Caryl Comes in June  Dr Lemmie Evens

## 2021-03-23 NOTE — Telephone Encounter (Signed)
Spoke with the patient. She will keep her appointment with Dr. Caryl Comes. She stated that at this point she does not feel it is cardiac related. She will call back if she develops worsening symptoms.

## 2021-04-10 DIAGNOSIS — R519 Headache, unspecified: Secondary | ICD-10-CM | POA: Diagnosis not present

## 2021-04-10 DIAGNOSIS — Z79899 Other long term (current) drug therapy: Secondary | ICD-10-CM | POA: Diagnosis not present

## 2021-04-10 DIAGNOSIS — M316 Other giant cell arteritis: Secondary | ICD-10-CM | POA: Diagnosis not present

## 2021-04-10 DIAGNOSIS — R700 Elevated erythrocyte sedimentation rate: Secondary | ICD-10-CM | POA: Diagnosis not present

## 2021-04-13 ENCOUNTER — Emergency Department (HOSPITAL_BASED_OUTPATIENT_CLINIC_OR_DEPARTMENT_OTHER): Payer: Medicare Other

## 2021-04-13 ENCOUNTER — Other Ambulatory Visit: Payer: Self-pay

## 2021-04-13 ENCOUNTER — Encounter (HOSPITAL_BASED_OUTPATIENT_CLINIC_OR_DEPARTMENT_OTHER): Payer: Self-pay | Admitting: Emergency Medicine

## 2021-04-13 ENCOUNTER — Emergency Department (HOSPITAL_BASED_OUTPATIENT_CLINIC_OR_DEPARTMENT_OTHER)
Admission: EM | Admit: 2021-04-13 | Discharge: 2021-04-13 | Disposition: A | Discharge status: Home / Self Care | Payer: Medicare Other | Attending: Emergency Medicine | Admitting: Emergency Medicine

## 2021-04-13 DIAGNOSIS — S0081XA Abrasion of other part of head, initial encounter: Secondary | ICD-10-CM | POA: Insufficient documentation

## 2021-04-13 DIAGNOSIS — S199XXA Unspecified injury of neck, initial encounter: Secondary | ICD-10-CM | POA: Diagnosis not present

## 2021-04-13 DIAGNOSIS — Z96651 Presence of right artificial knee joint: Secondary | ICD-10-CM | POA: Insufficient documentation

## 2021-04-13 DIAGNOSIS — Z853 Personal history of malignant neoplasm of breast: Secondary | ICD-10-CM | POA: Insufficient documentation

## 2021-04-13 DIAGNOSIS — I4891 Unspecified atrial fibrillation: Secondary | ICD-10-CM | POA: Insufficient documentation

## 2021-04-13 DIAGNOSIS — S0990XA Unspecified injury of head, initial encounter: Secondary | ICD-10-CM | POA: Diagnosis not present

## 2021-04-13 DIAGNOSIS — M2578 Osteophyte, vertebrae: Secondary | ICD-10-CM | POA: Diagnosis not present

## 2021-04-13 DIAGNOSIS — M4312 Spondylolisthesis, cervical region: Secondary | ICD-10-CM | POA: Diagnosis not present

## 2021-04-13 DIAGNOSIS — W19XXXA Unspecified fall, initial encounter: Secondary | ICD-10-CM

## 2021-04-13 DIAGNOSIS — Z7901 Long term (current) use of anticoagulants: Secondary | ICD-10-CM | POA: Insufficient documentation

## 2021-04-13 DIAGNOSIS — I10 Essential (primary) hypertension: Secondary | ICD-10-CM | POA: Diagnosis not present

## 2021-04-13 DIAGNOSIS — W01198A Fall on same level from slipping, tripping and stumbling with subsequent striking against other object, initial encounter: Secondary | ICD-10-CM | POA: Diagnosis not present

## 2021-04-13 DIAGNOSIS — S161XXA Strain of muscle, fascia and tendon at neck level, initial encounter: Secondary | ICD-10-CM | POA: Insufficient documentation

## 2021-04-13 DIAGNOSIS — Z79899 Other long term (current) drug therapy: Secondary | ICD-10-CM | POA: Diagnosis not present

## 2021-04-13 DIAGNOSIS — M47812 Spondylosis without myelopathy or radiculopathy, cervical region: Secondary | ICD-10-CM | POA: Diagnosis not present

## 2021-04-13 NOTE — ED Provider Notes (Signed)
[facility] EMERGENCY DEPARTMENT Provider Note   CSN: [id number] Arrival date & time: 04/13/21  1010     History Chief Complaint  Patient presents with   Fall    Head injury     Leslie Bryant is a 80 y.o. female.  She is here for evaluation of head injury after a fall on blood thinners.  She said she was bending over to cut some flowers that she got overbalanced and fell and hit her head on a tree stump.  No loss of consciousness.  Complaining of mild headache and some moderate left-sided neck pain.  No numbness or weakness.  She is on blood thinners for A. fib.  No chest pain or shortness of breath.  She said she has had balance problems since diagnosed with temporal arteritis few months ago  The history is provided by the patient.  Fall This is a new problem. The current episode started less than 1 hour ago. The problem has not changed since onset.Associated symptoms include headaches. Pertinent negatives include no chest pain, no abdominal pain and no shortness of breath. Nothing aggravates the symptoms. Nothing relieves the symptoms. She has tried nothing for the symptoms. The treatment provided no relief.      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  Resolved by CT scan Recurrent episode clinical dx 07/2012    Diverticulosis of colon    sigmoid   GERD (gastroesophageal reflux disease)    H/O hiatal hernia    Headache    History of kidney stones    "passed it" (02/19/2017)   Hypercholesterolemia    history   Left knee pain    occasionally   Palpitations IRREGULAR HEARTBEAT--  CONTROLLED W/  BETA BLOCKER   Pneumonia 2017    "walking pneumonia" (02/19/2017)   Swelling of left knee joint     Patient Active Problem List   Diagnosis Date Noted   Temporal arteritis ([place]) 02/04/2021   Elevated LFTs 02/01/2021   Renal insufficiency 02/01/2021   Leukocytosis 02/01/2021   Atrial fibrillation ([place]) 02/15/2020   Secondary hypercoagulable state ([place]) 02/15/2020   Nocturia 11/11/2019   Hematochezia 11/09/2019   Ganglion, finger joint of right hand 10/08/2019   Osteoarthritis of finger of left hand 10/08/2019   Acute sinus infection 09/11/2019   Left otitis media 07/28/2019   Venous insufficiency 02/09/2018   Chest pain 09/22/2017   Neck pain 09/22/2017   Abdominal pain, left lower quadrant 04/03/2017   Diarrhea 04/03/2017   Weight loss 04/03/2017   Total knee replacement status, right 02/19/2017   Bilateral otitis media with effusion 01/23/2017   Preoperative cardiovascular examination 12/24/2016   Hyponatremia 11/06/2016   Diabetes ([place]) 11/06/2016   Dysuria 04/30/2016   Plantar fasciitis of right foot 12/28/2014   Metatarsal deformity 12/28/2014   Unspecified deficiency anemia 06/09/2013   Anemia, unspecified 05/06/2013   Lower back pain 02/01/2013   Osteoarthritis of left knee 09/08/2012   Complex tear of medial meniscus of left knee as current injury 09/08/2012   Abdominal pain, lower 07/23/2012   Encounter for well adult exam  with abnormal findings 05/05/2012   Family hx of colon cancer 05/05/2012   Unilateral primary osteoarthritis, right knee 02/06/2011   History of breast cancer 02/06/2011   Palpitations    Supraventricular tachycardia ([place])    EUSTACHIAN TUBE DYSFUNCTION, LEFT 04/11/2010   Anxiety state 05/05/2008   Essential hypertension 05/05/2008   Allergic rhinitis 05/05/2008   IBS 05/05/2008   OSTEOPENIA 05/05/2008   COLONIC POLYPS, HX OF 05/05/2008   Hyperlipidemia 05/29/2007   GERD 05/29/2007    Past Surgical History:  Procedure Laterality Date   ARTERY BIOPSY Left 01/16/2021    Procedure: BIOPSY TEMPORAL ARTERY;  Surgeon: Leta Baptist, MD;  Location: [place];  Service: ENT;  Laterality: Left;   BREAST BIOPSY Right 1986   BREAST BIOPSY Left    BREAST RECONSTRUCTION Right 1987   W/ IMPLANTS   CARDIAC CATHETERIZATION  12-22-2000  DR BRACKBILL   NORMAL LVF/ NORMAL CORONARY ARTERIES   CARDIOVERSION N/A 03/23/2020   Procedure: CARDIOVERSION;  Surgeon: Donato Heinz, MD;  Location: [facility] ENDOSCOPY;  Service: Cardiovascular;  Laterality: N/A;   CARDIOVERSION N/A 07/20/2020   Procedure: CARDIOVERSION;  Surgeon: Buford Dresser, MD;  Location: [place];  Service: Cardiovascular;  Laterality: N/A;   [place]   CHONDROPLASTY  09/08/2012   Procedure: CHONDROPLASTY;  Surgeon: Tobi Bastos, MD;  Location: [place];  Service: Orthopedics;  Laterality: Left;   COLONOSCOPY     last colon 05-03-2009 (02/19/2017)   COLONOSCOPY W/ BIOPSIES AND POLYPECTOMY     KNEE ARTHROSCOPY WITH MEDIAL MENISECTOMY  09/08/2012   Procedure: KNEE ARTHROSCOPY WITH MEDIAL MENISECTOMY;  Surgeon: Tobi Bastos, MD;  Location: [place];  Service: Orthopedics;  Laterality: Left;  lateral tibial plateau,    MASTECTOMY Right 1986   W/ MULTIPLE NODE DISSECTION   REDUCTION MAMMAPLASTY Left 1987   TOTAL KNEE ARTHROPLASTY Left 07/21/2013   Procedure: TOTAL KNEE ARTHROPLASTY;  Surgeon: Newt Minion, MD;  Location: [place];  Service: Orthopedics;  Laterality: Left;  Left Total Knee Arthroplasty   TOTAL KNEE ARTHROPLASTY Right 02/19/2017   Procedure: RIGHT TOTAL KNEE ARTHROPLASTY;  Surgeon: Newt Minion, MD;  Location: [place];  Service: Orthopedics;  Laterality: Right;   VAGINAL HYSTERECTOMY  1966     OB History   No obstetric history on file.     Family History  Problem Relation Age of Onset   Heart attack Father    Heart failure Mother        CHF   Dementia Maternal Grandfather    Dementia Sister    Colon cancer  Brother        died/age 28   Esophageal cancer Brother    Lung cancer Sister    Brain cancer Sister    Cancer Brother        mouth   Rectal cancer Brother    Leukemia Brother    Cancer Sister        type unknown   Cancer Sister        type unknown   Heart disease Daughter    Stomach cancer Neg Hx     Social History   Tobacco Use   Smoking status: Never   Smokeless tobacco: Never  Vaping Use   Vaping Use: Never used  Substance Use Topics   Alcohol use: Not Currently    Alcohol/week: 1.0 standard drink    Types: 1 Glasses of wine per week   Drug use: No  Home Medications Prior to Admission medications   Medication Sig Start Date End Date Taking? Authorizing Provider  alendronate (FOSAMAX) 70 MG tablet Take 70 mg by mouth once a week. 01/30/21   [provider]  amiodarone (PACERONE) 200 MG tablet Take 1 tablet (200 mg total) by mouth daily. 06/19/20   Deboraha Sprang, MD  calcium carbonate (OS-CAL - DOSED IN MG OF ELEMENTAL CALCIUM) 1250 (500 Ca) MG tablet Take 1 tablet by mouth daily with breakfast.    [provider]  co-enzyme Q-10 30 MG capsule Take 30 mg by mouth 2 (two) times daily.    [provider]  ELIQUIS 5 MG TABS tablet Take 1 tablet by mouth twice daily 02/12/21   Hilty, Nadean Corwin, MD  ezetimibe (ZETIA) 10 MG tablet Take 1 tablet by mouth once daily 02/01/21   Deboraha Sprang, MD  famotidine (PEPCID) 40 MG tablet TAKE 1 TABLET BY MOUTH AT BEDTIME 03/22/21   Biagio Borg, MD  furosemide (LASIX) 40 MG tablet Take 1 tablet by mouth once daily 03/05/21   Hilty, Nadean Corwin, MD  hyoscyamine (LEVSIN SL) 0.125 MG SL tablet DISSOLVE 1 TABLET IN MOUTH THREE TIMES DAILY AS NEEDED FOR ABDOMINAL CRAMPS 12/22/20   Gatha Mayer, MD  ipratropium (ATROVENT) 0.06 % nasal spray as needed. 11/07/20   [provider]  metoprolol tartrate (LOPRESSOR) 50 MG tablet TAKE 1 & 1/2 (ONE & ONE-HALF) TABLETS BY MOUTH TWICE DAILY 11/29/20   Deboraha Sprang, MD   Multiple Vitamins-Minerals (CENTRUM SILVER PO) Take 1 tablet by mouth every morning. Pensions consultant, Historical, MD  omeprazole (PRILOSEC) 40 MG capsule TAKE 1 CAPSULE BY MOUTH TWICE DAILY BEFORE MEAL(S) 08/01/20   Almyra Deforest, PA  predniSONE (DELTASONE) 20 MG tablet  01/22/21   [provider]  zolpidem (AMBIEN) 5 MG tablet 1-2 tab by mouth at bedtime as needed 02/20/21   Biagio Borg, MD    Allergies    Crestor [rosuvastatin calcium], Lipitor [atorvastatin calcium], Pravachol [pravastatin], and Penicillins  Review of Systems   Review of Systems  Constitutional:  Negative for fever.  HENT:  Negative for sore throat.   Eyes:  Negative for visual disturbance.  Respiratory:  Negative for shortness of breath.   Cardiovascular:  Negative for chest pain.  Gastrointestinal:  Negative for abdominal pain.  Genitourinary:  Negative for dysuria.  Musculoskeletal:  Positive for neck pain.  Skin:  Negative for rash.  Neurological:  Positive for headaches.   Physical Exam Updated Vital Signs BP 124/74 (BP Location: Left Arm)   Pulse 77   Temp 98.2 F (36.8 C) (Oral)   Resp 18   Ht 5\' 1"  (1.549 m)   Wt 77.1 kg   SpO2 100%   BMI 32.12 kg/m   Physical Exam Vitals and nursing note reviewed.  Constitutional:      General: She is not in acute distress.    Appearance: Normal appearance. She is well-developed.  HENT:     Head: Normocephalic.     Comments: She has some abrasions to the top of her head and some swelling to her forehead.  No crepitus or depression appreciated Eyes:     Conjunctiva/sclera: Conjunctivae normal.  Cardiovascular:     Rate and Rhythm: Normal rate. Rhythm irregular.     Pulses: Normal pulses.     Heart sounds: No murmur heard. Pulmonary:     Effort: Pulmonary effort is normal. No respiratory distress.  Breath sounds: Normal breath sounds. No stridor. No wheezing.  Abdominal:     Palpations: Abdomen is soft.     Tenderness: There is no abdominal  tenderness.  Musculoskeletal:        General: No tenderness. Normal range of motion.     Cervical back: Neck supple.  Skin:    General: Skin is warm and dry.  Neurological:     General: No focal deficit present.     Mental Status: She is alert.     GCS: GCS eye subscore is 4. GCS verbal subscore is 5. GCS motor subscore is 6.    ED Results / Procedures / Treatments   Labs (all labs ordered are listed, but only abnormal results are displayed) Labs Reviewed - No data to display  EKG None  Radiology CT Head Wo Contrast  Result Date: 04/13/2021 CLINICAL DATA:  The patient suffered a fall and blow to the head today. Initial encounter. EXAM: CT HEAD WITHOUT CONTRAST CT CERVICAL SPINE WITHOUT CONTRAST TECHNIQUE: Multidetector CT imaging of the head and cervical spine was performed following the standard protocol without intravenous contrast. Multiplanar CT image reconstructions of the cervical spine were also generated. COMPARISON:  None. FINDINGS: CT HEAD FINDINGS Brain: No evidence of acute infarction, hemorrhage, hydrocephalus, extra-axial collection or mass lesion/mass effect. Chronic microvascular ischemic change noted. Vascular: No hyperdense vessel or unexpected calcification. Skull: Intact.  No focal lesion. Sinuses/Orbits: Negative. Other: None. CT CERVICAL SPINE FINDINGS Alignment: Trace anterolisthesis C4 degenerative on C5 is due change. To facet No traumatic listhesis. Skull base and vertebrae: No acute fracture. No primary bone lesion or focal pathologic process. Soft tissues and spinal canal: No prevertebral fluid or swelling. No visible canal hematoma. Disc levels: Loss of disc space height and endplate spurring are worst at C5-6. Multilevel facet degenerative change noted. Upper chest: The lung apices clear. Other: None. IMPRESSION: No acute abnormality head or cervical spine. Chronic microvascular ischemic change. Cervical spondylosis. Electronically Signed   By: Inge Rise  M.D.   On: 04/13/2021 11:19   CT Cervical Spine Wo Contrast  Result Date: 04/13/2021 CLINICAL DATA:  The patient suffered a fall and blow to the head today. Initial encounter. EXAM: CT HEAD WITHOUT CONTRAST CT CERVICAL SPINE WITHOUT CONTRAST TECHNIQUE: Multidetector CT imaging of the head and cervical spine was performed following the standard protocol without intravenous contrast. Multiplanar CT image reconstructions of the cervical spine were also generated. COMPARISON:  None. FINDINGS: CT HEAD FINDINGS Brain: No evidence of acute infarction, hemorrhage, hydrocephalus, extra-axial collection or mass lesion/mass effect. Chronic microvascular ischemic change noted. Vascular: No hyperdense vessel or unexpected calcification. Skull: Intact.  No focal lesion. Sinuses/Orbits: Negative. Other: None. CT CERVICAL SPINE FINDINGS Alignment: Trace anterolisthesis C4 degenerative on C5 is due change. To facet No traumatic listhesis. Skull base and vertebrae: No acute fracture. No primary bone lesion or focal pathologic process. Soft tissues and spinal canal: No prevertebral fluid or swelling. No visible canal hematoma. Disc levels: Loss of disc space height and endplate spurring are worst at C5-6. Multilevel facet degenerative change noted. Upper chest: The lung apices clear. Other: None. IMPRESSION: No acute abnormality head or cervical spine. Chronic microvascular ischemic change. Cervical spondylosis. Electronically Signed   By: Inge Rise M.D.   On: 04/13/2021 11:19    Procedures Procedures   Medications Ordered in ED Medications - No data to display  ED Course  I have reviewed the triage vital signs and the nursing notes.  Pertinent labs &  imaging results that were available during my care of the patient were reviewed by me and considered in my medical decision making (see chart for details).    MDM Rules/Calculators/A&P                          80 year old female on anticoagulation here after  head injury from a fall.  Differential includes skull fracture, intracranial bleed, contusion, concussion, cervical fracture cervical strain.  Imaging does not show any acute findings other than some soft tissue swelling.  Reviewed with patient and her spouse.  They are comfortable plan for outpatient management of this.  Return instructions discussed Final Clinical Impression(s) / ED Diagnoses Final diagnoses:  Fall, initial encounter  Minor head injury, initial encounter  Acute strain of neck muscle, initial encounter    Rx / DC Orders ED Discharge Orders     None        Hayden Rasmussen, MD 04/13/21 [phone]

## 2021-04-13 NOTE — Discharge Instructions (Addendum)
You were seen in the emergency department for evaluation of injuries after a fall.  You had a CAT scan of your head and cervical spine that did not show any acute findings.  You likely have a bruise and abrasions to your head and a strain of your neck.  You can do ice to the affected areas and Tylenol for pain.  Follow-up with your doctor.  Return to the emergency department if any worsening or concerning symptoms

## 2021-04-13 NOTE — ED Triage Notes (Signed)
Pt was in  squad position cutting flowers , lost her balance fell on tree stump , head injury. Obvious skin abrasion . On Eliquis . Alert and oriented x 4.

## 2021-04-16 ENCOUNTER — Emergency Department (HOSPITAL_BASED_OUTPATIENT_CLINIC_OR_DEPARTMENT_OTHER)
Admission: EM | Admit: 2021-04-16 | Discharge: 2021-04-16 | Disposition: A | Discharge status: Home / Self Care | Payer: Medicare Other | Source: Home / Self Care | Attending: Emergency Medicine | Admitting: Emergency Medicine

## 2021-04-16 ENCOUNTER — Emergency Department (HOSPITAL_BASED_OUTPATIENT_CLINIC_OR_DEPARTMENT_OTHER): Payer: Medicare Other

## 2021-04-16 ENCOUNTER — Ambulatory Visit: Payer: Medicare Other | Admitting: Internal Medicine

## 2021-04-16 ENCOUNTER — Encounter (HOSPITAL_BASED_OUTPATIENT_CLINIC_OR_DEPARTMENT_OTHER): Payer: Self-pay | Admitting: *Deleted

## 2021-04-16 DIAGNOSIS — I6529 Occlusion and stenosis of unspecified carotid artery: Secondary | ICD-10-CM | POA: Diagnosis not present

## 2021-04-16 DIAGNOSIS — R220 Localized swelling, mass and lump, head: Secondary | ICD-10-CM | POA: Diagnosis not present

## 2021-04-16 DIAGNOSIS — Z96653 Presence of artificial knee joint, bilateral: Secondary | ICD-10-CM | POA: Insufficient documentation

## 2021-04-16 DIAGNOSIS — G4489 Other headache syndrome: Secondary | ICD-10-CM | POA: Diagnosis not present

## 2021-04-16 DIAGNOSIS — E871 Hypo-osmolality and hyponatremia: Secondary | ICD-10-CM | POA: Insufficient documentation

## 2021-04-16 DIAGNOSIS — R0902 Hypoxemia: Secondary | ICD-10-CM | POA: Diagnosis not present

## 2021-04-16 DIAGNOSIS — R531 Weakness: Secondary | ICD-10-CM | POA: Insufficient documentation

## 2021-04-16 DIAGNOSIS — Z853 Personal history of malignant neoplasm of breast: Secondary | ICD-10-CM | POA: Insufficient documentation

## 2021-04-16 DIAGNOSIS — R609 Edema, unspecified: Secondary | ICD-10-CM | POA: Diagnosis not present

## 2021-04-16 DIAGNOSIS — I4891 Unspecified atrial fibrillation: Secondary | ICD-10-CM | POA: Insufficient documentation

## 2021-04-16 DIAGNOSIS — I672 Cerebral atherosclerosis: Secondary | ICD-10-CM | POA: Diagnosis not present

## 2021-04-16 DIAGNOSIS — K5900 Constipation, unspecified: Secondary | ICD-10-CM | POA: Diagnosis not present

## 2021-04-16 LAB — COMPREHENSIVE METABOLIC PANEL
ALT: 73 U/L — ABNORMAL HIGH (ref 0–44)
AST: 32 U/L (ref 15–41)
Albumin: 3.1 g/dL — ABNORMAL LOW (ref 3.5–5.0)
Alkaline Phosphatase: 46 U/L (ref 38–126)
Anion gap: 12 (ref 5–15)
BUN: 21 mg/dL (ref 8–23)
CO2: 28 mmol/L (ref 22–32)
Calcium: 8.9 mg/dL (ref 8.9–10.3)
Chloride: 88 mmol/L — ABNORMAL LOW (ref 98–111)
Creatinine, Ser: 0.9 mg/dL (ref 0.44–1.00)
GFR, Estimated: >60 mL/min (ref >60)
Glucose, Bld: 123 mg/dL — ABNORMAL HIGH (ref 70–99)
Potassium: 3.7 mmol/L (ref 3.5–5.1)
Sodium: 128 mmol/L — ABNORMAL LOW (ref 135–145)
Total Bilirubin: 0.5 mg/dL (ref 0.3–1.2)
Total Protein: 6.6 g/dL (ref 6.5–8.1)

## 2021-04-16 LAB — URINALYSIS, ROUTINE W REFLEX MICROSCOPIC
Bilirubin Urine: NEGATIVE
Glucose, UA: NEGATIVE mg/dL
Hgb urine dipstick: NEGATIVE
Ketones, ur: NEGATIVE mg/dL
Leukocytes,Ua: NEGATIVE
Nitrite: NEGATIVE
Protein, ur: 100 mg/dL — AB
Specific Gravity, Urine: 1.01 (ref 1.005–1.030)
pH: 7 (ref 5.0–8.0)

## 2021-04-16 LAB — CBC
HCT: 33.1 % — ABNORMAL LOW (ref 36.0–46.0)
Hemoglobin: 11.8 g/dL — ABNORMAL LOW (ref 12.0–15.0)
MCH: 32.7 pg (ref 26.0–34.0)
MCHC: 35.6 g/dL (ref 30.0–36.0)
MCV: 91.7 fL (ref 80.0–100.0)
Platelets: 154 10*3/uL (ref 150–400)
RBC: 3.61 MIL/uL — ABNORMAL LOW (ref 3.87–5.11)
RDW: 13.2 % (ref 11.5–15.5)
WBC: 9.1 10*3/uL (ref 4.0–10.5)
nRBC: 0.2 % (ref 0.0–0.2)

## 2021-04-16 LAB — URINALYSIS, MICROSCOPIC (REFLEX)

## 2021-04-16 LAB — PROTIME-INR
INR: 1.2 (ref 0.8–1.2)
Prothrombin Time: 15.2 seconds (ref 11.4–15.2)

## 2021-04-16 MED ORDER — SODIUM CHLORIDE 0.9 % IV BOLUS
1000.0000 mL | Freq: Once | INTRAVENOUS | Status: AC
  Administered 2021-04-16: 1000 mL via INTRAVENOUS

## 2021-04-16 NOTE — ED Provider Notes (Signed)
I received this patient in signout from Dr. Sedonia Small.  Briefly, had presented with weakness in the setting of recent fall.  At time of signout, head CT was negative, blood work notable for mild hyponatremia with sodium of 128, awaiting urinalysis and reassessment after IV fluid bolus.  UA without evidence of infection.  She had initially had mild tachycardia and borderline soft blood pressure but vital signs normalized after fluids.  She is comfortable and alert on reassessment.  I discussed supportive measures for hyponatremia including modest fluid restriction and modest increase in salt in diet.  She has a follow-up appointment with her PCP in 3 days and reviewed return precautions with patient and significant other.  They voiced understanding.   Ruthel Martine, Wenda Overland, MD 04/16/21 [phone]

## 2021-04-16 NOTE — ED Notes (Signed)
Pt ambulated to restroom with stand by assist.  

## 2021-04-16 NOTE — ED Triage Notes (Signed)
Pt arrives via [facility] from home. Pt had a fall on Friday, seen here for the same. Weakness in her legs L>R for several weeks. Negative orthostatics. Negative stroke screen. Posterior sharp headache last night, improved with tylenol. 115/63, hr 109, cbg 133

## 2021-04-16 NOTE — ED Provider Notes (Signed)
[facility] Emergency Department Provider Note MRN:  [id number]  Arrival date & time: 04/16/21     Chief Complaint   Weakness History of Present Illness   Leslie Bryant is a 80 y.o. year-old female with a history of A. fib, breast cancer presenting to the ED with chief complaint of weakness.  Generalized weakness for the past few weeks.  Fall 2 to 3 days ago, evaluated in the emergency department with normal CT head.  Endorsing headache earlier today but improved with Tylenol.  Denies neck or back pain, no chest pain or shortness of breath, no abdominal pain.  Noting some intermittent burning with urination recently.  Symptoms are mild, constant, no exacerbating or alleviating factors.  Review of Systems  A complete 10 system review of systems was obtained and all systems are negative except as noted in the HPI and PMH.   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  Resolved by CT scan Recurrent episode clinical dx 07/2012    Diverticulosis of colon    sigmoid   GERD (gastroesophageal reflux disease)    H/O hiatal hernia    Headache    History of kidney stones    "passed it" (02/19/2017)   Hypercholesterolemia    history   Left knee pain    occasionally   Palpitations IRREGULAR HEARTBEAT--  CONTROLLED W/  BETA BLOCKER   Pneumonia 2017   "walking pneumonia" (02/19/2017)   Swelling of left knee joint     Past Surgical History:  Procedure Laterality Date   ARTERY BIOPSY Left 01/16/2021   Procedure: BIOPSY TEMPORAL ARTERY;  Surgeon: Leta Baptist, MD;  Location: [place];   Service: ENT;  Laterality: Left;   BREAST BIOPSY Right 1986   BREAST BIOPSY Left    BREAST RECONSTRUCTION Right 1987   W/ IMPLANTS   CARDIAC CATHETERIZATION  12-22-2000  DR BRACKBILL   NORMAL LVF/ NORMAL CORONARY ARTERIES   CARDIOVERSION N/A 03/23/2020   Procedure: CARDIOVERSION;  Surgeon: Donato Heinz, MD;  Location: [facility] ENDOSCOPY;  Service: Cardiovascular;  Laterality: N/A;   CARDIOVERSION N/A 07/20/2020   Procedure: CARDIOVERSION;  Surgeon: Buford Dresser, MD;  Location: [place];  Service: Cardiovascular;  Laterality: N/A;   [place]   CHONDROPLASTY  09/08/2012   Procedure: CHONDROPLASTY;  Surgeon: Tobi Bastos, MD;  Location: [place];  Service: Orthopedics;  Laterality: Left;   COLONOSCOPY     last colon 05-03-2009 (02/19/2017)   COLONOSCOPY W/ BIOPSIES AND POLYPECTOMY     KNEE ARTHROSCOPY WITH MEDIAL MENISECTOMY  09/08/2012   Procedure: KNEE ARTHROSCOPY WITH MEDIAL MENISECTOMY;  Surgeon: Tobi Bastos, MD;  Location: [place];  Service: Orthopedics;  Laterality: Left;  lateral tibial plateau,    MASTECTOMY Right 1986   W/ MULTIPLE NODE DISSECTION   REDUCTION MAMMAPLASTY Left 1987   TOTAL KNEE ARTHROPLASTY Left 07/21/2013   Procedure: TOTAL KNEE ARTHROPLASTY;  Surgeon: Newt Minion, MD;  Location: [place];  Service: Orthopedics;  Laterality: Left;  Left Total Knee  Arthroplasty   TOTAL KNEE ARTHROPLASTY Right 02/19/2017   Procedure: RIGHT TOTAL KNEE ARTHROPLASTY;  Surgeon: Newt Minion, MD;  Location: [place];  Service: Orthopedics;  Laterality: Right;   VAGINAL HYSTERECTOMY  1966    Family History  Problem Relation Age of Onset   Heart attack Father    Heart failure Mother        CHF   Dementia Maternal Grandfather    Dementia Sister    Colon cancer Brother        died/age 71   Esophageal cancer Brother    Lung cancer Sister    Brain cancer Sister    Cancer Brother        mouth   Rectal cancer  Brother    Leukemia Brother    Cancer Sister        type unknown   Cancer Sister        type unknown   Heart disease Daughter    Stomach cancer Neg Hx     Social History   Socioeconomic History   Marital status: Married    Spouse name: Not on file   Number of children: 2   Years of education: Not on file   Highest education level: Not on file  Occupational History   Occupation: Retired  Tobacco Use   Smoking status: Never   Smokeless tobacco: Never  Vaping Use   Vaping Use: Never used  Substance and Sexual Activity   Alcohol use: Not Currently    Alcohol/week: 1.0 standard drink    Types: 1 Glasses of wine per week   Drug use: No   Sexual activity: Yes    Birth control/protection: Surgical  Other Topics Concern   Not on file  Social History Narrative   She is married and retired and has 2 grown children   1 glass of wine a month no tobacco no drug use   Social Determinants of Radio broadcast assistant Strain: Not on file  Food Insecurity: Not on file  Transportation Needs: Not on file  Physical Activity: Not on file  Stress: Not on file  Social Connections: Not on file  Intimate Partner Violence: Not on file     Physical Exam   Vitals:   04/16/21 0600 04/16/21 0632  BP: (!) 109/54 (!) 99/58  Pulse: (!) 108 (!) 106  Resp: 15   Temp:    SpO2: 93% 91%    CONSTITUTIONAL: Well-appearing, NAD NEURO:  Alert and oriented x 3, normal and symmetric strength and sensation, normal coordination, normal speech EYES:  eyes equal and reactive ENT/NECK:  no LAD, no JVD CARDIO: Regular rate, well-perfused, normal S1 and S2 PULM:  CTAB no wheezing or rhonchi GI/GU:  normal bowel sounds, non-distended, non-tender MSK/SPINE:  No gross deformities, no edema SKIN:  no rash, bruising to right periorbital region [facility]:  Appropriate speech and behavior  *Additional and/or pertinent findings included in MDM below  Diagnostic and Interventional Summary    EKG  Interpretation  Date/Time:  Monday April 16 2021 05:37:07 EDT Ventricular Rate:  108 PR Interval:  197 QRS Duration: 82 QT Interval:  350 QTC Calculation: 470 R Axis:   -7 Text Interpretation: Sinus tachycardia Low voltage, precordial leads Confirmed by Gerlene Fee [phone]) on 04/16/2021 6:27:59 AM        Labs Reviewed  CBC - Abnormal; Notable for the following components:      Result Value   RBC 3.61 (*)    Hemoglobin 11.8 (*)  HCT 33.1 (*)    All other components within normal limits  COMPREHENSIVE METABOLIC PANEL - Abnormal; Notable for the following components:   Sodium 128 (*)    Chloride 88 (*)    Glucose, Bld 123 (*)    Albumin 3.1 (*)    ALT 73 (*)    All other components within normal limits  PROTIME-INR  URINALYSIS, ROUTINE W REFLEX MICROSCOPIC    CT HEAD WO CONTRAST  Final Result      Medications  sodium chloride 0.9 % bolus 1,000 mL (1,000 mLs Intravenous New Bag/Given 04/16/21 6433)     Procedures  /  Critical Care Procedures  ED Course and Medical Decision Making  I have reviewed the triage vital signs, the nursing notes, and pertinent available records from the EMR.  Listed above are laboratory and imaging tests that I personally ordered, reviewed, and interpreted and then considered in my medical decision making (see below for details).  Generalized weakness, no focal neurological deficits, vital signs are overall reassuring, mildly tachycardic.  Unclear reason for this generalized weakness but seems more chronic.  Recent dysuria so considering UTI.  Also considering metabolic disarray, anemia.  Work-up pending.     Labs reveal hyponatremia, hypochloremia.  Otherwise reassuring.  CT head is without delayed bleeding or any other concerns.  Still awaiting urinalysis.  Providing liter IV fluids, which should help correct the sodium, improve her tachycardia, soft blood pressure.  Anticipating discharge so long as vital signs normalize.  If persistent  tachycardia or soft blood pressure with evidence of UTI, would consider admission signed out to oncoming provider at shift change.  Barth Kirks. Sedonia Small, [place] [email or web address]  Final Clinical Impressions(s) / ED Diagnoses     ICD-10-CM   1. Weakness  R53.1     2. Hyponatremia  E87.1       ED Discharge Orders     None        Discharge Instructions Discussed with and Provided to Patient:   Discharge Instructions   None       Maudie Flakes, MD 04/16/21 [phone]

## 2021-04-16 NOTE — ED Notes (Signed)
D/C teaching done with patient and husband who reports he already has follow up set with PCP on Thursday. Pt wheeled out, and driven home by family.

## 2021-04-16 NOTE — ED Notes (Signed)
ED Provider at bedside. 

## 2021-04-16 NOTE — ED Notes (Signed)
Patient aware that we need urine sample for testing, unable at this time. Pt given instruction on providing urine sample when able to do so. Purewick placed.

## 2021-04-19 ENCOUNTER — Ambulatory Visit (INDEPENDENT_AMBULATORY_CARE_PROVIDER_SITE_OTHER): Payer: Medicare Other | Admitting: Internal Medicine

## 2021-04-19 ENCOUNTER — Inpatient Hospital Stay (HOSPITAL_BASED_OUTPATIENT_CLINIC_OR_DEPARTMENT_OTHER)
Admission: EM | Admit: 2021-04-19 | Discharge: 2021-06-04 | DRG: 870 | Disposition: E | Payer: Medicare Other | Attending: Pulmonary Disease | Admitting: Pulmonary Disease

## 2021-04-19 ENCOUNTER — Encounter (HOSPITAL_BASED_OUTPATIENT_CLINIC_OR_DEPARTMENT_OTHER): Payer: Self-pay | Admitting: Emergency Medicine

## 2021-04-19 ENCOUNTER — Other Ambulatory Visit: Payer: Self-pay

## 2021-04-19 ENCOUNTER — Emergency Department (HOSPITAL_BASED_OUTPATIENT_CLINIC_OR_DEPARTMENT_OTHER): Payer: Medicare Other

## 2021-04-19 ENCOUNTER — Encounter: Payer: Self-pay | Admitting: Internal Medicine

## 2021-04-19 DIAGNOSIS — I2510 Atherosclerotic heart disease of native coronary artery without angina pectoris: Secondary | ICD-10-CM | POA: Diagnosis not present

## 2021-04-19 DIAGNOSIS — N179 Acute kidney failure, unspecified: Secondary | ICD-10-CM | POA: Diagnosis not present

## 2021-04-19 DIAGNOSIS — K567 Ileus, unspecified: Secondary | ICD-10-CM | POA: Diagnosis not present

## 2021-04-19 DIAGNOSIS — Z4659 Encounter for fitting and adjustment of other gastrointestinal appliance and device: Secondary | ICD-10-CM

## 2021-04-19 DIAGNOSIS — I50810 Right heart failure, unspecified: Secondary | ICD-10-CM | POA: Diagnosis present

## 2021-04-19 DIAGNOSIS — Z6831 Body mass index (BMI) 31.0-31.9, adult: Secondary | ICD-10-CM

## 2021-04-19 DIAGNOSIS — E861 Hypovolemia: Secondary | ICD-10-CM | POA: Diagnosis not present

## 2021-04-19 DIAGNOSIS — K7200 Acute and subacute hepatic failure without coma: Secondary | ICD-10-CM | POA: Diagnosis present

## 2021-04-19 DIAGNOSIS — K6389 Other specified diseases of intestine: Secondary | ICD-10-CM | POA: Diagnosis not present

## 2021-04-19 DIAGNOSIS — Z8249 Family history of ischemic heart disease and other diseases of the circulatory system: Secondary | ICD-10-CM

## 2021-04-19 DIAGNOSIS — D649 Anemia, unspecified: Secondary | ICD-10-CM | POA: Diagnosis not present

## 2021-04-19 DIAGNOSIS — E871 Hypo-osmolality and hyponatremia: Secondary | ICD-10-CM

## 2021-04-19 DIAGNOSIS — D849 Immunodeficiency, unspecified: Secondary | ICD-10-CM | POA: Diagnosis not present

## 2021-04-19 DIAGNOSIS — I480 Paroxysmal atrial fibrillation: Secondary | ICD-10-CM | POA: Diagnosis present

## 2021-04-19 DIAGNOSIS — Z7952 Long term (current) use of systemic steroids: Secondary | ICD-10-CM

## 2021-04-19 DIAGNOSIS — Z808 Family history of malignant neoplasm of other organs or systems: Secondary | ICD-10-CM

## 2021-04-19 DIAGNOSIS — Z20822 Contact with and (suspected) exposure to covid-19: Secondary | ICD-10-CM | POA: Diagnosis present

## 2021-04-19 DIAGNOSIS — I083 Combined rheumatic disorders of mitral, aortic and tricuspid valves: Secondary | ICD-10-CM | POA: Diagnosis present

## 2021-04-19 DIAGNOSIS — Z96653 Presence of artificial knee joint, bilateral: Secondary | ICD-10-CM | POA: Diagnosis present

## 2021-04-19 DIAGNOSIS — Y848 Other medical procedures as the cause of abnormal reaction of the patient, or of later complication, without mention of misadventure at the time of the procedure: Secondary | ICD-10-CM | POA: Diagnosis not present

## 2021-04-19 DIAGNOSIS — I132 Hypertensive heart and chronic kidney disease with heart failure and with stage 5 chronic kidney disease, or end stage renal disease: Secondary | ICD-10-CM | POA: Diagnosis present

## 2021-04-19 DIAGNOSIS — D62 Acute posthemorrhagic anemia: Secondary | ICD-10-CM | POA: Diagnosis not present

## 2021-04-19 DIAGNOSIS — Z4682 Encounter for fitting and adjustment of non-vascular catheter: Secondary | ICD-10-CM | POA: Diagnosis not present

## 2021-04-19 DIAGNOSIS — E8770 Fluid overload, unspecified: Secondary | ICD-10-CM | POA: Diagnosis not present

## 2021-04-19 DIAGNOSIS — E785 Hyperlipidemia, unspecified: Secondary | ICD-10-CM | POA: Diagnosis present

## 2021-04-19 DIAGNOSIS — E874 Mixed disorder of acid-base balance: Secondary | ICD-10-CM | POA: Diagnosis not present

## 2021-04-19 DIAGNOSIS — E11649 Type 2 diabetes mellitus with hypoglycemia without coma: Secondary | ICD-10-CM | POA: Diagnosis not present

## 2021-04-19 DIAGNOSIS — Z66 Do not resuscitate: Secondary | ICD-10-CM | POA: Diagnosis present

## 2021-04-19 DIAGNOSIS — I724 Aneurysm of artery of lower extremity: Secondary | ICD-10-CM | POA: Diagnosis not present

## 2021-04-19 DIAGNOSIS — J189 Pneumonia, unspecified organism: Secondary | ICD-10-CM

## 2021-04-19 DIAGNOSIS — N186 End stage renal disease: Secondary | ICD-10-CM | POA: Diagnosis not present

## 2021-04-19 DIAGNOSIS — T148XXA Other injury of unspecified body region, initial encounter: Secondary | ICD-10-CM | POA: Diagnosis not present

## 2021-04-19 DIAGNOSIS — E119 Type 2 diabetes mellitus without complications: Secondary | ICD-10-CM

## 2021-04-19 DIAGNOSIS — Z9049 Acquired absence of other specified parts of digestive tract: Secondary | ICD-10-CM

## 2021-04-19 DIAGNOSIS — Z452 Encounter for adjustment and management of vascular access device: Secondary | ICD-10-CM | POA: Diagnosis not present

## 2021-04-19 DIAGNOSIS — M316 Other giant cell arteritis: Secondary | ICD-10-CM | POA: Diagnosis present

## 2021-04-19 DIAGNOSIS — E869 Volume depletion, unspecified: Secondary | ICD-10-CM | POA: Diagnosis not present

## 2021-04-19 DIAGNOSIS — R6521 Severe sepsis with septic shock: Secondary | ICD-10-CM | POA: Diagnosis not present

## 2021-04-19 DIAGNOSIS — G928 Other toxic encephalopathy: Secondary | ICD-10-CM | POA: Diagnosis not present

## 2021-04-19 DIAGNOSIS — E669 Obesity, unspecified: Secondary | ICD-10-CM | POA: Diagnosis present

## 2021-04-19 DIAGNOSIS — E1165 Type 2 diabetes mellitus with hyperglycemia: Secondary | ICD-10-CM | POA: Diagnosis not present

## 2021-04-19 DIAGNOSIS — S27321D Contusion of lung, unilateral, subsequent encounter: Secondary | ICD-10-CM

## 2021-04-19 DIAGNOSIS — R918 Other nonspecific abnormal finding of lung field: Secondary | ICD-10-CM | POA: Diagnosis not present

## 2021-04-19 DIAGNOSIS — L7632 Postprocedural hematoma of skin and subcutaneous tissue following other procedure: Secondary | ICD-10-CM | POA: Diagnosis not present

## 2021-04-19 DIAGNOSIS — J80 Acute respiratory distress syndrome: Secondary | ICD-10-CM | POA: Diagnosis not present

## 2021-04-19 DIAGNOSIS — A419 Sepsis, unspecified organism: Secondary | ICD-10-CM | POA: Diagnosis not present

## 2021-04-19 DIAGNOSIS — K219 Gastro-esophageal reflux disease without esophagitis: Secondary | ICD-10-CM | POA: Diagnosis present

## 2021-04-19 DIAGNOSIS — Z800 Family history of malignant neoplasm of digestive organs: Secondary | ICD-10-CM

## 2021-04-19 DIAGNOSIS — R739 Hyperglycemia, unspecified: Secondary | ICD-10-CM

## 2021-04-19 DIAGNOSIS — D631 Anemia in chronic kidney disease: Secondary | ICD-10-CM | POA: Diagnosis present

## 2021-04-19 DIAGNOSIS — N170 Acute kidney failure with tubular necrosis: Secondary | ICD-10-CM | POA: Diagnosis present

## 2021-04-19 DIAGNOSIS — I10 Essential (primary) hypertension: Secondary | ICD-10-CM | POA: Diagnosis present

## 2021-04-19 DIAGNOSIS — J811 Chronic pulmonary edema: Secondary | ICD-10-CM | POA: Diagnosis not present

## 2021-04-19 DIAGNOSIS — E878 Other disorders of electrolyte and fluid balance, not elsewhere classified: Secondary | ICD-10-CM | POA: Diagnosis present

## 2021-04-19 DIAGNOSIS — B9689 Other specified bacterial agents as the cause of diseases classified elsewhere: Secondary | ICD-10-CM | POA: Diagnosis not present

## 2021-04-19 DIAGNOSIS — W19XXXD Unspecified fall, subsequent encounter: Secondary | ICD-10-CM | POA: Diagnosis present

## 2021-04-19 DIAGNOSIS — Z0189 Encounter for other specified special examinations: Secondary | ICD-10-CM

## 2021-04-19 DIAGNOSIS — R531 Weakness: Secondary | ICD-10-CM | POA: Diagnosis not present

## 2021-04-19 DIAGNOSIS — Z87442 Personal history of urinary calculi: Secondary | ICD-10-CM

## 2021-04-19 DIAGNOSIS — R0602 Shortness of breath: Secondary | ICD-10-CM | POA: Diagnosis not present

## 2021-04-19 DIAGNOSIS — Z7189 Other specified counseling: Secondary | ICD-10-CM | POA: Diagnosis not present

## 2021-04-19 DIAGNOSIS — I4891 Unspecified atrial fibrillation: Secondary | ICD-10-CM

## 2021-04-19 DIAGNOSIS — I5033 Acute on chronic diastolic (congestive) heart failure: Secondary | ICD-10-CM | POA: Diagnosis present

## 2021-04-19 DIAGNOSIS — Z853 Personal history of malignant neoplasm of breast: Secondary | ICD-10-CM

## 2021-04-19 DIAGNOSIS — I517 Cardiomegaly: Secondary | ICD-10-CM | POA: Diagnosis not present

## 2021-04-19 DIAGNOSIS — Y92239 Unspecified place in hospital as the place of occurrence of the external cause: Secondary | ICD-10-CM | POA: Diagnosis not present

## 2021-04-19 DIAGNOSIS — N390 Urinary tract infection, site not specified: Secondary | ICD-10-CM

## 2021-04-19 DIAGNOSIS — R578 Other shock: Secondary | ICD-10-CM | POA: Diagnosis not present

## 2021-04-19 DIAGNOSIS — J9601 Acute respiratory failure with hypoxia: Secondary | ICD-10-CM | POA: Diagnosis not present

## 2021-04-19 DIAGNOSIS — Z515 Encounter for palliative care: Secondary | ICD-10-CM | POA: Diagnosis not present

## 2021-04-19 DIAGNOSIS — Z806 Family history of leukemia: Secondary | ICD-10-CM

## 2021-04-19 DIAGNOSIS — Z9011 Acquired absence of right breast and nipple: Secondary | ICD-10-CM

## 2021-04-19 DIAGNOSIS — T4275XA Adverse effect of unspecified antiepileptic and sedative-hypnotic drugs, initial encounter: Secondary | ICD-10-CM | POA: Diagnosis not present

## 2021-04-19 DIAGNOSIS — E875 Hyperkalemia: Secondary | ICD-10-CM | POA: Diagnosis not present

## 2021-04-19 DIAGNOSIS — J9690 Respiratory failure, unspecified, unspecified whether with hypoxia or hypercapnia: Secondary | ICD-10-CM | POA: Diagnosis not present

## 2021-04-19 DIAGNOSIS — Z9882 Breast implant status: Secondary | ICD-10-CM

## 2021-04-19 DIAGNOSIS — R0603 Acute respiratory distress: Secondary | ICD-10-CM | POA: Diagnosis not present

## 2021-04-19 DIAGNOSIS — Z9889 Other specified postprocedural states: Secondary | ICD-10-CM

## 2021-04-19 DIAGNOSIS — Z1611 Resistance to penicillins: Secondary | ICD-10-CM | POA: Diagnosis present

## 2021-04-19 DIAGNOSIS — J984 Other disorders of lung: Secondary | ICD-10-CM | POA: Diagnosis not present

## 2021-04-19 DIAGNOSIS — T380X5A Adverse effect of glucocorticoids and synthetic analogues, initial encounter: Secondary | ICD-10-CM | POA: Diagnosis present

## 2021-04-19 DIAGNOSIS — Z978 Presence of other specified devices: Secondary | ICD-10-CM

## 2021-04-19 DIAGNOSIS — D6489 Other specified anemias: Secondary | ICD-10-CM | POA: Diagnosis present

## 2021-04-19 DIAGNOSIS — Z7901 Long term (current) use of anticoagulants: Secondary | ICD-10-CM

## 2021-04-19 DIAGNOSIS — J479 Bronchiectasis, uncomplicated: Secondary | ICD-10-CM | POA: Diagnosis not present

## 2021-04-19 DIAGNOSIS — R7401 Elevation of levels of liver transaminase levels: Secondary | ICD-10-CM | POA: Diagnosis not present

## 2021-04-19 DIAGNOSIS — R609 Edema, unspecified: Secondary | ICD-10-CM

## 2021-04-19 DIAGNOSIS — Z9911 Dependence on respirator [ventilator] status: Secondary | ICD-10-CM | POA: Diagnosis not present

## 2021-04-19 DIAGNOSIS — R0902 Hypoxemia: Secondary | ICD-10-CM | POA: Diagnosis not present

## 2021-04-19 DIAGNOSIS — I5022 Chronic systolic (congestive) heart failure: Secondary | ICD-10-CM | POA: Diagnosis not present

## 2021-04-19 DIAGNOSIS — M6250 Muscle wasting and atrophy, not elsewhere classified, unspecified site: Secondary | ICD-10-CM | POA: Diagnosis present

## 2021-04-19 DIAGNOSIS — B961 Klebsiella pneumoniae [K. pneumoniae] as the cause of diseases classified elsewhere: Secondary | ICD-10-CM | POA: Diagnosis present

## 2021-04-19 DIAGNOSIS — Z90710 Acquired absence of both cervix and uterus: Secondary | ICD-10-CM

## 2021-04-19 DIAGNOSIS — E1122 Type 2 diabetes mellitus with diabetic chronic kidney disease: Secondary | ICD-10-CM | POA: Diagnosis not present

## 2021-04-19 DIAGNOSIS — R296 Repeated falls: Secondary | ICD-10-CM | POA: Diagnosis present

## 2021-04-19 DIAGNOSIS — Z801 Family history of malignant neoplasm of trachea, bronchus and lung: Secondary | ICD-10-CM

## 2021-04-19 DIAGNOSIS — R059 Cough, unspecified: Secondary | ICD-10-CM | POA: Diagnosis not present

## 2021-04-19 LAB — CBC WITH DIFFERENTIAL/PLATELET
Abs Immature Granulocytes: 0.19 10*3/uL — ABNORMAL HIGH (ref 0.00–0.07)
Basophils Absolute: 0.1 10*3/uL (ref 0.0–0.1)
Basophils Relative: 1 %
Eosinophils Absolute: 0 10*3/uL (ref 0.0–0.5)
Eosinophils Relative: 0 %
HCT: 31.1 % — ABNORMAL LOW (ref 36.0–46.0)
Hemoglobin: 10.9 g/dL — ABNORMAL LOW (ref 12.0–15.0)
Immature Granulocytes: 1 %
Lymphocytes Relative: 3 %
Lymphs Abs: 0.4 10*3/uL — ABNORMAL LOW (ref 0.7–4.0)
MCH: 32.3 pg (ref 26.0–34.0)
MCHC: 35 g/dL (ref 30.0–36.0)
MCV: 92.3 fL (ref 80.0–100.0)
Monocytes Absolute: 0.3 10*3/uL (ref 0.1–1.0)
Monocytes Relative: 2 %
Neutro Abs: 14.8 10*3/uL — ABNORMAL HIGH (ref 1.7–7.7)
Neutrophils Relative %: 93 %
Platelets: 181 10*3/uL (ref 150–400)
RBC: 3.37 MIL/uL — ABNORMAL LOW (ref 3.87–5.11)
RDW: 13.7 % (ref 11.5–15.5)
Smear Review: NORMAL
WBC: 15.7 10*3/uL — ABNORMAL HIGH (ref 4.0–10.5)
nRBC: 0.1 % (ref 0.0–0.2)

## 2021-04-19 LAB — URINALYSIS, ROUTINE W REFLEX MICROSCOPIC
Bilirubin Urine: NEGATIVE
Glucose, UA: >=500 mg/dL — AB
Ketones, ur: NEGATIVE mg/dL
Leukocytes,Ua: NEGATIVE
Nitrite: POSITIVE — AB
Protein, ur: 30 mg/dL — AB
Specific Gravity, Urine: 1.015 (ref 1.005–1.030)
pH: 5.5 (ref 5.0–8.0)

## 2021-04-19 LAB — GLUCOSE, CAPILLARY: Glucose-Capillary: 264 mg/dL — ABNORMAL HIGH (ref 70–99)

## 2021-04-19 LAB — LACTIC ACID, PLASMA
Lactic Acid, Venous: 2.5 mmol/L (ref 0.5–1.9)
Lactic Acid, Venous: 2 mmol/L (ref 0.5–1.9)

## 2021-04-19 LAB — URINALYSIS, MICROSCOPIC (REFLEX)

## 2021-04-19 LAB — TSH: TSH: 0.115 u[IU]/mL — ABNORMAL LOW (ref 0.350–4.500)

## 2021-04-19 LAB — RESP PANEL BY RT-PCR (FLU A&B, COVID) ARPGX2
Influenza A by PCR: NEGATIVE
Influenza B by PCR: NEGATIVE
SARS Coronavirus 2 by RT PCR: NEGATIVE

## 2021-04-19 LAB — COMPREHENSIVE METABOLIC PANEL
ALT: 55 U/L — ABNORMAL HIGH (ref 0–44)
AST: 40 U/L (ref 15–41)
Albumin: 2.3 g/dL — ABNORMAL LOW (ref 3.5–5.0)
Alkaline Phosphatase: 84 U/L (ref 38–126)
Anion gap: 12 (ref 5–15)
BUN: 31 mg/dL — ABNORMAL HIGH (ref 8–23)
CO2: 25 mmol/L (ref 22–32)
Calcium: 8.6 mg/dL — ABNORMAL LOW (ref 8.9–10.3)
Chloride: 86 mmol/L — ABNORMAL LOW (ref 98–111)
Creatinine, Ser: 1.38 mg/dL — ABNORMAL HIGH (ref 0.44–1.00)
GFR, Estimated: 39 mL/min — ABNORMAL LOW (ref >60)
Glucose, Bld: 440 mg/dL — ABNORMAL HIGH (ref 70–99)
Potassium: 3.5 mmol/L (ref 3.5–5.1)
Sodium: 123 mmol/L — ABNORMAL LOW (ref 135–145)
Total Bilirubin: 0.5 mg/dL (ref 0.3–1.2)
Total Protein: 6.6 g/dL (ref 6.5–8.1)

## 2021-04-19 LAB — CBG MONITORING, ED: Glucose-Capillary: 319 mg/dL — ABNORMAL HIGH (ref 70–99)

## 2021-04-19 LAB — TROPONIN I (HIGH SENSITIVITY)
Troponin I (High Sensitivity): 20 ng/L — ABNORMAL HIGH (ref <18)
Troponin I (High Sensitivity): 20 ng/L — ABNORMAL HIGH (ref <18)

## 2021-04-19 LAB — PROTIME-INR
INR: 2.2 — ABNORMAL HIGH (ref 0.8–1.2)
Prothrombin Time: 24.1 seconds — ABNORMAL HIGH (ref 11.4–15.2)

## 2021-04-19 LAB — BRAIN NATRIURETIC PEPTIDE: B Natriuretic Peptide: 467.5 pg/mL — ABNORMAL HIGH (ref 0.0–100.0)

## 2021-04-19 LAB — LIPASE, BLOOD: Lipase: 27 U/L (ref 11–51)

## 2021-04-19 MED ORDER — INSULIN ASPART 100 UNIT/ML IJ SOLN
0.0000 [IU] | Freq: Every day | INTRAMUSCULAR | Status: DC
  Administered 2021-04-19: 3 [IU] via SUBCUTANEOUS

## 2021-04-19 MED ORDER — SODIUM CHLORIDE 0.9 % IV SOLN
500.0000 mg | INTRAVENOUS | Status: DC
  Administered 2021-04-20 – 2021-04-21 (×2): 500 mg via INTRAVENOUS
  Filled 2021-04-19 (×2): qty 500

## 2021-04-19 MED ORDER — APIXABAN 5 MG PO TABS
5.0000 mg | ORAL_TABLET | Freq: Two times a day (BID) | ORAL | Status: DC
  Administered 2021-04-19 – 2021-04-21 (×3): 5 mg via ORAL
  Filled 2021-04-19 (×3): qty 1

## 2021-04-19 MED ORDER — PANTOPRAZOLE SODIUM 40 MG PO TBEC
80.0000 mg | DELAYED_RELEASE_TABLET | Freq: Every day | ORAL | Status: DC
  Administered 2021-04-20: 80 mg via ORAL
  Filled 2021-04-19: qty 2

## 2021-04-19 MED ORDER — INSULIN ASPART 100 UNIT/ML IJ SOLN
0.0000 [IU] | Freq: Three times a day (TID) | INTRAMUSCULAR | Status: DC
  Administered 2021-04-20: 8 [IU] via SUBCUTANEOUS
  Administered 2021-04-20: 3 [IU] via SUBCUTANEOUS
  Administered 2021-04-20: 5 [IU] via SUBCUTANEOUS

## 2021-04-19 MED ORDER — CHLORHEXIDINE GLUCONATE CLOTH 2 % EX PADS
6.0000 | MEDICATED_PAD | Freq: Every day | CUTANEOUS | Status: DC
  Administered 2021-04-19 – 2021-05-06 (×19): 6 via TOPICAL

## 2021-04-19 MED ORDER — SODIUM CHLORIDE 0.9 % IV SOLN: Freq: Once | INTRAVENOUS | Status: AC

## 2021-04-19 MED ORDER — PREDNISONE 20 MG PO TABS
30.0000 mg | ORAL_TABLET | Freq: Every day | ORAL | Status: DC
  Administered 2021-04-20: 30 mg via ORAL
  Filled 2021-04-19: qty 1

## 2021-04-19 MED ORDER — METOPROLOL TARTRATE 5 MG/5ML IV SOLN
5.0000 mg | Freq: Once | INTRAVENOUS | Status: AC
  Administered 2021-04-19: 5 mg via INTRAVENOUS
  Filled 2021-04-19: qty 5

## 2021-04-19 MED ORDER — DILTIAZEM HCL-DEXTROSE 125-5 MG/125ML-% IV SOLN (PREMIX)
5.0000 mg/h | INTRAVENOUS | Status: DC
  Administered 2021-04-19: 5 mg/h via INTRAVENOUS
  Administered 2021-04-20 (×2): 15 mg/h via INTRAVENOUS
  Filled 2021-04-19 (×3): qty 125

## 2021-04-19 MED ORDER — INSULIN ASPART 100 UNIT/ML IV SOLN
5.0000 [IU] | Freq: Once | INTRAVENOUS | Status: AC
  Administered 2021-04-19: 5 [IU] via INTRAVENOUS

## 2021-04-19 MED ORDER — ORAL CARE MOUTH RINSE
15.0000 mL | Freq: Two times a day (BID) | OROMUCOSAL | Status: DC
  Administered 2021-04-19 – 2021-04-20 (×3): 15 mL via OROMUCOSAL

## 2021-04-19 MED ORDER — SODIUM CHLORIDE 0.9 % IV SOLN
500.0000 mg | INTRAVENOUS | Status: DC
  Administered 2021-04-19: 500 mg via INTRAVENOUS
  Filled 2021-04-19: qty 500

## 2021-04-19 MED ORDER — SODIUM CHLORIDE 0.9 % IV SOLN
2.0000 g | INTRAVENOUS | Status: DC
  Administered 2021-04-19: 2 g via INTRAVENOUS
  Filled 2021-04-19: qty 20

## 2021-04-19 MED ORDER — SODIUM CHLORIDE 0.9 % IV BOLUS
500.0000 mL | Freq: Once | INTRAVENOUS | Status: AC
  Administered 2021-04-19: 500 mL via INTRAVENOUS

## 2021-04-19 MED ORDER — SODIUM CHLORIDE 0.9 % IV SOLN
2.0000 g | INTRAVENOUS | Status: DC
  Administered 2021-04-20: 2 g via INTRAVENOUS
  Filled 2021-04-19 (×2): qty 20

## 2021-04-19 MED ORDER — METOPROLOL TARTRATE 25 MG PO TABS
75.0000 mg | ORAL_TABLET | Freq: Two times a day (BID) | ORAL | Status: DC
  Administered 2021-04-19 – 2021-04-20 (×2): 75 mg via ORAL
  Filled 2021-04-19 (×3): qty 3

## 2021-04-19 NOTE — ED Provider Notes (Signed)
Emergency Department Provider Note   I have reviewed the triage vital signs and the nursing notes.   HISTORY  Chief Complaint No chief complaint on file.   HPI Leslie Bryant is a 80 y.o. female with past medical history reviewed below including A. fib on Eliquis with prior history of 2 cardioversions in the past presents to the emergency department with weakness, shortness of breath, fever.  She was sent down by her PCP after their evaluation earlier today.  She has reportedly had recurrent falls over the past several days including most recently in her garden giving her black eyes.  She has had low sodiums during these visits along with multiple CT scans of the head which have been normal.  She feels that her leg swelling is slightly worse.  On arrival the patient's oxygen saturations are low which is not been the case in the past and states she does not use home oxygen.  Denies any abdominal pain, vomiting, diarrhea.  No urinary tract infection symptoms.  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  Resolved by CT scan Recurrent episode clinical dx 07/2012    Diverticulosis of colon    sigmoid   GERD (gastroesophageal reflux disease)    H/O hiatal hernia    Headache    History of kidney stones    "passed it" (02/19/2017)   Hypercholesterolemia    history   Left knee pain    occasionally   Palpitations IRREGULAR HEARTBEAT--  CONTROLLED W/  BETA BLOCKER   Pneumonia 2017   "walking pneumonia" (02/19/2017)   Swelling of left knee joint     Patient Active Problem List   Diagnosis Date Noted   Acute hypoxemic respiratory failure ([place])  04/25/2021   Atrial fibrillation, rapid ([place]) 04/09/2021   Pneumonia 04/07/2021   Temporal arteritis ([place]) 02/04/2021   Elevated LFTs 02/01/2021   Renal insufficiency 02/01/2021   Leukocytosis 02/01/2021   Atrial fibrillation ([place]) 02/15/2020   Secondary hypercoagulable state ([place]) 02/15/2020   Nocturia 11/11/2019   Hematochezia 11/09/2019   Ganglion, finger joint of right hand 10/08/2019   Osteoarthritis of finger of left hand 10/08/2019   Acute sinus infection 09/11/2019   Left otitis media 07/28/2019   Venous insufficiency 02/09/2018   Chest pain 09/22/2017   Neck pain 09/22/2017   Abdominal pain, left lower quadrant 04/03/2017   Diarrhea 04/03/2017   Weight loss 04/03/2017   Total knee replacement status, right 02/19/2017   Bilateral otitis media with effusion 01/23/2017   Preoperative cardiovascular examination 12/24/2016   Hyponatremia 11/06/2016   Diabetes ([place]) 11/06/2016   Dysuria 04/30/2016   Plantar fasciitis of right foot 12/28/2014   Metatarsal deformity 12/28/2014   Unspecified deficiency anemia 06/09/2013   Anemia, unspecified 05/06/2013   Lower back pain 02/01/2013   Osteoarthritis of left knee 09/08/2012   Complex tear of medial meniscus of left knee as current injury 09/08/2012   Abdominal pain, lower 07/23/2012   Encounter for well adult exam with abnormal findings 05/05/2012   Family hx of colon cancer 05/05/2012   Unilateral primary osteoarthritis, right knee 02/06/2011  History of breast cancer 02/06/2011   Palpitations    Supraventricular tachycardia ([place])    EUSTACHIAN TUBE DYSFUNCTION, LEFT 04/11/2010   Anxiety state 05/05/2008   Essential hypertension 05/05/2008   Allergic rhinitis 05/05/2008   IBS 05/05/2008   OSTEOPENIA 05/05/2008   COLONIC POLYPS, HX OF 05/05/2008   Hyperlipidemia 05/29/2007   GERD 05/29/2007    Past Surgical History:  Procedure Laterality Date   ARTERY BIOPSY Left 01/16/2021   Procedure: BIOPSY TEMPORAL ARTERY;   Surgeon: Leta Baptist, MD;  Location: [facility];  Service: ENT;  Laterality: Left;   BREAST BIOPSY Right 1986   BREAST BIOPSY Left    BREAST RECONSTRUCTION Right 1987   W/ IMPLANTS   CARDIAC CATHETERIZATION  12-22-2000  DR BRACKBILL   NORMAL LVF/ NORMAL CORONARY ARTERIES   CARDIOVERSION N/A 03/23/2020   Procedure: CARDIOVERSION;  Surgeon: Donato Heinz, MD;  Location: [facility];  Service: Cardiovascular;  Laterality: N/A;   CARDIOVERSION N/A 07/20/2020   Procedure: CARDIOVERSION;  Surgeon: Buford Dresser, MD;  Location: [facility];  Service: Cardiovascular;  Laterality: N/A;   [place]   CHONDROPLASTY  09/08/2012   Procedure: CHONDROPLASTY;  Surgeon: Tobi Bastos, MD;  Location: [facility];  Service: Orthopedics;  Laterality: Left;   COLONOSCOPY     last colon 05-03-2009 (02/19/2017)   COLONOSCOPY W/ BIOPSIES AND POLYPECTOMY     KNEE ARTHROSCOPY WITH MEDIAL MENISECTOMY  09/08/2012   Procedure: KNEE ARTHROSCOPY WITH MEDIAL MENISECTOMY;  Surgeon: Tobi Bastos, MD;  Location: [facility];  Service: Orthopedics;  Laterality: Left;  lateral tibial plateau,    MASTECTOMY Right 1986   W/ MULTIPLE NODE DISSECTION   REDUCTION MAMMAPLASTY Left 1987   TOTAL KNEE ARTHROPLASTY Left 07/21/2013   Procedure: TOTAL KNEE ARTHROPLASTY;  Surgeon: Newt Minion, MD;  Location: [facility];  Service: Orthopedics;  Laterality: Left;  Left Total Knee Arthroplasty   TOTAL KNEE ARTHROPLASTY Right 02/19/2017   Procedure: RIGHT TOTAL KNEE ARTHROPLASTY;  Surgeon: Newt Minion, MD;  Location: [facility];  Service: Orthopedics;  Laterality: Right;   VAGINAL HYSTERECTOMY  1966    Allergies Crestor [rosuvastatin calcium], Lipitor [atorvastatin calcium], Pravachol [pravastatin], and Penicillins  Family History  Problem Relation Age of Onset   Heart attack Father    Heart failure Mother        CHF   Dementia Maternal Grandfather    Dementia  Sister    Colon cancer Brother        died/age 44   Esophageal cancer Brother    Lung cancer Sister    Brain cancer Sister    Cancer Brother        mouth   Rectal cancer Brother    Leukemia Brother    Cancer Sister        type unknown   Cancer Sister        type unknown   Heart disease Daughter    Stomach cancer Neg Hx     Social History Social History   Tobacco Use   Smoking status: Never   Smokeless tobacco: Never  Vaping Use   Vaping Use: Never used  Substance Use Topics   Alcohol use: Not Currently    Alcohol/week: 1.0 standard drink    Types: 1 Glasses of wine per week   Drug use: No    Review of Systems  Constitutional: Positive fever/chills. Positive weakness and frequent falls.  Eyes: No visual changes. ENT: No sore  throat. Cardiovascular: Denies chest pain. Respiratory: Positive shortness of breath and cough.  Gastrointestinal: No abdominal pain.  No nausea, no vomiting.  No diarrhea.  No constipation. Genitourinary: Negative for dysuria. Musculoskeletal: Negative for back pain. Skin: Negative for rash. Neurological: Negative for headaches, focal weakness or numbness.  10-point ROS otherwise negative.  ____________________________________________   PHYSICAL EXAM:  VITAL SIGNS: ED Triage Vitals  Enc Vitals Group     BP 04/23/2021 1454 98/64     Pulse Rate 04/24/2021 1454 (!) 124     Resp 05/02/2021 1454 20     Temp 04/12/2021 1454 98 F (36.7 C)     Temp Source 04/27/2021 1454 Oral     SpO2 04/20/2021 1452 (!) 85 %     Weight 04/27/2021 1451 170 lb (77.1 kg)     Height 04/18/2021 1451 5\' 2"  (1.575 m)   Constitutional: Alert and oriented. Well appearing and in no acute distress. Eyes: Conjunctivae are normal.  Several days old appearing ecchymosis around both eyes.  Head: Atraumatic. Nose: No congestion/rhinnorhea. Mouth/Throat: Mucous membranes are moist. Neck: No stridor.  Cardiovascular: Normal rate, regular rhythm. Good peripheral circulation.  Grossly normal heart sounds.   Respiratory: Slight increased respiratory effort.  No retractions. Lungs with crackles at the bases.  Gastrointestinal: Soft and nontender. No distention.  Musculoskeletal: No lower extremity tenderness nor edema. No gross deformities of extremities. Neurologic:  Normal speech and language. No gross focal neurologic deficits are appreciated.  Skin:  Skin is warm, dry and intact. No rash noted.  ____________________________________________   LABS (all labs ordered are listed, but only abnormal results are displayed)  Labs Reviewed  COMPREHENSIVE METABOLIC PANEL - Abnormal; Notable for the following components:      Result Value   Sodium 123 (*)    Chloride 86 (*)    Glucose, Bld 440 (*)    BUN 31 (*)    Creatinine, Ser 1.38 (*)    Calcium 8.6 (*)    Albumin 2.3 (*)    ALT 55 (*)    GFR, Estimated 39 (*)    All other components within normal limits  LACTIC ACID, PLASMA - Abnormal; Notable for the following components:   Lactic Acid, Venous 2.5 (*)    All other components within normal limits  LACTIC ACID, PLASMA - Abnormal; Notable for the following components:   Lactic Acid, Venous 2.0 (*)    All other components within normal limits  CBC WITH DIFFERENTIAL/PLATELET - Abnormal; Notable for the following components:   WBC 15.7 (*)    RBC 3.37 (*)    Hemoglobin 10.9 (*)    HCT 31.1 (*)    Neutro Abs 14.8 (*)    Lymphs Abs 0.4 (*)    Abs Immature Granulocytes 0.19 (*)    All other components within normal limits  PROTIME-INR - Abnormal; Notable for the following components:   Prothrombin Time 24.1 (*)    INR 2.2 (*)    All other components within normal limits  BRAIN NATRIURETIC PEPTIDE - Abnormal; Notable for the following components:   B Natriuretic Peptide 467.5 (*)    All other components within normal limits  TROPONIN I (HIGH SENSITIVITY) - Abnormal; Notable for the following components:   Troponin I (High Sensitivity) 20 (*)    All  other components within normal limits  RESP PANEL BY RT-PCR (FLU A&B, COVID) [id number]  URINE CULTURE  CULTURE, BLOOD (ROUTINE X 2)  CULTURE, BLOOD (ROUTINE X 2)  LIPASE, BLOOD  URINALYSIS,  ROUTINE W REFLEX MICROSCOPIC  TROPONIN I (HIGH SENSITIVITY)   ____________________________________________  EKG   EKG Interpretation  Date/Time:  Thursday April 19 2021 14:49:26 EDT Ventricular Rate:  125 PR Interval:    QRS Duration: 70 QT Interval:  294 QTC Calculation: 424 R Axis:   2 Text Interpretation: Atrial fibrillation with rapid ventricular response Low voltage QRS Nonspecific ST abnormality Abnormal ECG Confirmed by Nanda Quinton [id number]) on 04/28/2021 3:04:58 PM         ____________________________________________  RADIOLOGY  DG Chest Portable 1 View  Result Date: 04/05/2021 CLINICAL DATA:  Shortness of breath.  Weakness.  Now with cough. EXAM: PORTABLE CHEST 1 VIEW COMPARISON:  02/02/2019 FINDINGS: Stable cardiomediastinal contours. Diffuse coarsened interstitial markings are identified bilaterally. New bilateral lower lung zone airspace opacities are noted, right greater than left. The visualized osseous structures appear intact. Right axillary surgical clips and breast prosthesis noted. IMPRESSION: 1. New bilateral lower lung zone airspace opacities, right greater than left. Imaging findings concerning for multifocal pneumonia. 2. Chronic interstitial lung disease. Electronically Signed   By: Kerby Moors M.D.   On: 04/29/2021 16:11    ____________________________________________   PROCEDURES  Procedure(s) performed:   Procedures  CRITICAL CARE Performed by: Margette Fast Total critical care time: 35 minutes Critical care time was exclusive of separately billable procedures and treating other patients. Critical care was necessary to treat or prevent imminent or life-threatening deterioration. Critical care was time spent personally by me on the following activities:  development of treatment plan with patient and/or surrogate as well as nursing, discussions with consultants, evaluation of patient's response to treatment, examination of patient, obtaining history from patient or surrogate, ordering and performing treatments and interventions, ordering and review of laboratory studies, ordering and review of radiographic studies, pulse oximetry and re-evaluation of patient's condition.  Nanda Quinton, MD Emergency Medicine  ____________________________________________   INITIAL IMPRESSION / ASSESSMENT AND PLAN / ED COURSE  Pertinent labs & imaging results that were available during my care of the patient were reviewed by me and considered in my medical decision making (see chart for details).   Patient arrives to the emergency department with shortness of breath, weakness, fever, mild leg swelling.  Prior ED visits noted for falls with head injury.  CT scans have been normal.  Sodium is downtrending during past visits.  Patient arrives with A. fib with mild RVR.  Rate in the 120s on triage but on my evaluation closer to 190.  We will give IV metoprolol.  Afebrile here but reports fevers at home.  We will need to test for COVID.  Differential includes ongoing infection, worsening hyponatremia, decompensated CHF although fairly minimal swelling on exam. Low PE suspicion.  Patient is compliant with her Eliquis.   Patient's lab work resulting with elevated lactic acid, leukocytosis, worsening hyponatremia.  Could be decompensated heart failure but patient reports having documented fever at home.  X-ray shows bilateral infiltrate concerning for multifocal pneumonia.  COVID and flu PCR is negative.  Plan to start antibiotics.  Patient's A. fib with RVR controlled with IV metoprolol but not requiring an infusion.   Discussed patient's case with [facility] to request admission. Patient and family (if present) updated with plan. Care transferred to [facility] service.  I reviewed all  nursing notes, vitals, pertinent old records, EKGs, labs, imaging (as available).  ____________________________________________  FINAL CLINICAL IMPRESSION(S) / ED DIAGNOSES  Final diagnoses:  Multifocal pneumonia  Atrial fibrillation with RVR (HCC)  Hyponatremia  AKI (acute  kidney injury) ([place])  Hyperglycemia     MEDICATIONS GIVEN DURING THIS VISIT:  Medications  cefTRIAXone (ROCEPHIN) 2 g in sodium chloride 0.9 % 100 mL IVPB (2 g Intravenous New Bag/Given 04/29/2021 1821)  azithromycin (ZITHROMAX) 500 mg in sodium chloride 0.9 % 250 mL IVPB (has no administration in time range)  sodium chloride 0.9 % bolus 500 mL (0 mLs Intravenous Stopped 05/01/2021 1710)  metoprolol tartrate (LOPRESSOR) injection 5 mg (5 mg Intravenous Given 04/20/2021 1559)  insulin aspart (novoLOG) injection 5 Units (5 Units Intravenous Given 04/13/2021 1736)  0.9 %  sodium chloride infusion ( Intravenous New Bag/Given 04/23/2021 1751)    Note:  This document was prepared using Dragon voice recognition software and may include unintentional dictation errors.  Nanda Quinton, MD, [facility] Emergency Medicine    Latrecia Capito, [facility], MD 04/16/2021 [phone]

## 2021-04-19 NOTE — ED Notes (Signed)
Pt assisted to [place], increased HR and resp rate with any exertion.  sats down to 88% with exertion

## 2021-04-19 NOTE — ED Notes (Signed)
Report given to [facility] with [facility]

## 2021-04-19 NOTE — ED Triage Notes (Addendum)
Sob started a few days ago  no cp    was seen for weakness on 6/13 has a cough now, pt coming from  Dr office and was sent here for further

## 2021-04-19 NOTE — H&P (Signed)
History and Physical    Leslie Bryant ZYS:[id number] DOB: [birth year] DOA: 04/06/2021  PCP: Biagio Borg, MD  Patient coming from: Home  I have personally briefly reviewed patient's old medical records in [place]  Chief Complaint: SOB  HPI: Leslie Bryant is a 80 y.o. female with medical history significant of A.Fib, HTN, GCA on prednisone.  Pt presents to ED with c/o generalized weakness, SOB, subjective fever.  Pt went to PCP today with recurrent falls over past several days.  SOB, orthopnea, DOE.  Mild worsening of BLE edema.  Large amount of wt gain over past couple of months due to prednisone for GCA.  No CP, no palpitations, no abd pain, no dysuria.   ED Course: Satting 85% on RA.  CXR reveals BLL infiltrates.  BNP 467 up from 156 in May 21.  Trops 20 and 20.  Sodium 123, BGL 440, creat 1.38  WBC 15.7k  COVID and flu neg.  Pt in A.Fib RVR with rates 130-150.  Afebrile.   Review of Systems: As per HPI, otherwise all review of systems negative.  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  Resolved by CT scan Recurrent episode clinical dx 07/2012    Diverticulosis of colon    sigmoid   GERD (gastroesophageal reflux disease)    H/O hiatal hernia    Headache    History of kidney stones    "passed it" (02/19/2017)   Hypercholesterolemia    history   Left knee pain    occasionally   Palpitations IRREGULAR HEARTBEAT--  CONTROLLED W/  BETA BLOCKER   Pneumonia 2017   "walking pneumonia" (02/19/2017)   Swelling of left knee joint     Past Surgical History:  Procedure Laterality Date   ARTERY BIOPSY Left  01/16/2021   Procedure: BIOPSY TEMPORAL ARTERY;  Surgeon: Leta Baptist, MD;  Location: [facility];  Service: ENT;  Laterality: Left;   BREAST BIOPSY Right 1986   BREAST BIOPSY Left    BREAST RECONSTRUCTION Right 1987   W/ IMPLANTS   CARDIAC CATHETERIZATION  12-22-2000  DR BRACKBILL   NORMAL LVF/ NORMAL CORONARY ARTERIES   CARDIOVERSION N/A 03/23/2020   Procedure: CARDIOVERSION;  Surgeon: Donato Heinz, MD;  Location: [facility] ENDOSCOPY;  Service: Cardiovascular;  Laterality: N/A;   CARDIOVERSION N/A 07/20/2020   Procedure: CARDIOVERSION;  Surgeon: Buford Dresser, MD;  Location: [facility];  Service: Cardiovascular;  Laterality: N/A;   [place]   CHONDROPLASTY  09/08/2012   Procedure: CHONDROPLASTY;  Surgeon: Tobi Bastos, MD;  Location: [facility];  Service: Orthopedics;  Laterality: Left;   COLONOSCOPY     last colon 05-03-2009 (02/19/2017)   COLONOSCOPY W/ BIOPSIES AND POLYPECTOMY     KNEE ARTHROSCOPY WITH MEDIAL MENISECTOMY  09/08/2012   Procedure: KNEE ARTHROSCOPY WITH MEDIAL MENISECTOMY;  Surgeon: Tobi Bastos, MD;  Location: [facility];  Service: Orthopedics;  Laterality: Left;  lateral tibial plateau,    MASTECTOMY Right 1986   W/ MULTIPLE NODE DISSECTION   REDUCTION MAMMAPLASTY Left 1987   TOTAL  KNEE ARTHROPLASTY Left 07/21/2013   Procedure: TOTAL KNEE ARTHROPLASTY;  Surgeon: Newt Minion, MD;  Location: [facility];  Service: Orthopedics;  Laterality: Left;  Left Total Knee Arthroplasty   TOTAL KNEE ARTHROPLASTY Right 02/19/2017   Procedure: RIGHT TOTAL KNEE ARTHROPLASTY;  Surgeon: Newt Minion, MD;  Location: [facility];  Service: Orthopedics;  Laterality: Right;   VAGINAL HYSTERECTOMY  1966     reports that she has never smoked. She has never used smokeless tobacco. She reports previous alcohol use of about 1.0 standard drink of alcohol per week. She reports that she does not use drugs.  Allergies  Allergen  Reactions   Crestor [Rosuvastatin Calcium] Other (See Comments)    Shoulder pain   Crestor [Rosuvastatin]     Other reaction(s): Unknown   Lipitor [Atorvastatin Calcium] Other (See Comments)    Myalgias    Pravachol [Pravastatin] Other (See Comments)    myalgias   Penicillins Itching    Has patient had a PCN reaction causing immediate rash, facial/tongue/throat swelling, SOB or lightheadedness with hypotension: No Has patient had a PCN reaction causing severe rash involving mucus membranes or skin necrosis: No Has patient had a PCN reaction that required hospitalization No Has patient had a PCN reaction occurring within the last 10 years: No If all of the above answers are "NO", then may proceed with Cephalosporin use.     Family History  Problem Relation Age of Onset   Heart attack Father    Heart failure Mother        CHF   Dementia Maternal Grandfather    Dementia Sister    Colon cancer Brother        died/age 29   Esophageal cancer Brother    Lung cancer Sister    Brain cancer Sister    Cancer Brother        mouth   Rectal cancer Brother    Leukemia Brother    Cancer Sister        type unknown   Cancer Sister        type unknown   Heart disease Daughter    Stomach cancer Neg Hx      Prior to Admission medications   Medication Sig Start Date End Date Taking? Authorizing Provider  alendronate (FOSAMAX) 70 MG tablet Take 70 mg by mouth once a week. 01/30/21  Yes [provider]  amiodarone (PACERONE) 200 MG tablet Take 1 tablet (200 mg total) by mouth daily. 06/19/20  Yes Deboraha Sprang, MD  calcium carbonate (OS-CAL - DOSED IN MG OF ELEMENTAL CALCIUM) 1250 (500 Ca) MG tablet Take 1 tablet by mouth daily with breakfast.   Yes [provider]  co-enzyme Q-10 30 MG capsule Take 30 mg by mouth daily.   Yes [provider]  ELIQUIS 5 MG TABS tablet Take 1 tablet by mouth twice daily Patient taking differently: Take 5 mg by mouth 2 (two) times  daily. 02/12/21  Yes Hilty, Nadean Corwin, MD  ezetimibe (ZETIA) 10 MG tablet Take 1 tablet by mouth once daily Patient taking differently: Take 10 mg by mouth daily. 02/01/21  Yes Deboraha Sprang, MD  famotidine (PEPCID) 40 MG tablet TAKE 1 TABLET BY MOUTH AT BEDTIME Patient taking differently: Take 40 mg by mouth daily. 03/22/21  Yes Biagio Borg, MD  furosemide (LASIX) 40 MG tablet Take 1 tablet by mouth once daily Patient taking differently: Take 40 mg by mouth daily. 03/05/21  Yes Hilty, Nadean Corwin,  MD  metoprolol tartrate (LOPRESSOR) 50 MG tablet TAKE 1 & 1/2 (ONE & ONE-HALF) TABLETS BY MOUTH TWICE DAILY Patient taking differently: Take 75 mg by mouth 2 (two) times daily. 11/29/20  Yes Deboraha Sprang, MD  Multiple Vitamins-Minerals (CENTRUM SILVER PO) Take 1 tablet by mouth every morning.   Yes [provider]  omeprazole (PRILOSEC) 40 MG capsule TAKE 1 CAPSULE BY MOUTH TWICE DAILY BEFORE MEAL(S) Patient taking differently: Take 40 mg by mouth daily. 08/01/20  Yes Almyra Deforest, PA  predniSONE (DELTASONE) 10 MG tablet Take 10 mg by mouth 2 (two) times daily with a meal. 04/10/21  Yes [provider]  ACTEMRA 162 MG/0.9ML SOSY Inject 162 mg into the skin every 14 (fourteen) days. 04/06/21   [provider]  hyoscyamine (LEVSIN SL) 0.125 MG SL tablet DISSOLVE 1 TABLET IN MOUTH THREE TIMES DAILY AS NEEDED FOR ABDOMINAL CRAMPS 12/22/20 04/22/2021  Gatha Mayer, MD  ipratropium (ATROVENT) 0.06 % nasal spray as needed. 11/07/20 04/10/2021  [provider]  zolpidem (AMBIEN) 5 MG tablet 1-2 tab by mouth at bedtime as needed 02/20/21 04/14/2021  Biagio Borg, MD    Physical Exam: Vitals:   04/04/2021 1630 04/26/2021 1800 05/01/2021 1830 04/29/2021 1835  BP: 103/82 135/77 (!) 144/88 (!) 143/94  Pulse: 74 89 (!) 146 91  Resp: 16 18 (!) 26 (!) 24  Temp:      TempSrc:      SpO2: 96% 95% 91% 94%  Weight:      Height:        Constitutional: NAD, calm, comfortable Eyes: bruising at B eyes  (dt falls per pt). ENMT: Mucous membranes are moist. Posterior pharynx clear of any exudate or lesions.Normal dentition.  Neck: normal, supple, no masses, no thyromegaly Respiratory: Crackles at bases.  Cardiovascular: IRR, IRR, tachycardic.  Trace BLE edema Abdomen: no tenderness, no masses palpated. No hepatosplenomegaly. Bowel sounds positive.  Musculoskeletal: no clubbing / cyanosis. No joint deformity upper and lower extremities. Good ROM, no contractures. Normal muscle tone.  Skin: no rashes, lesions, ulcers. No induration Neurologic: CN 2-12 grossly intact. Sensation intact, DTR normal. Strength 5/5 in all 4.  Psychiatric: Normal judgment and insight. Alert and oriented x 3. Normal mood.    Labs on Admission: I have personally reviewed following labs and imaging studies  CBC: Recent Labs  Lab 04/16/21 0538 04/22/2021 1527  WBC 9.1 15.7*  NEUTROABS  --  14.8*  HGB 11.8* 10.9*  HCT 33.1* 31.1*  MCV 91.7 92.3  PLT 154 381   Basic Metabolic Panel: Recent Labs  Lab 04/16/21 0538 04/20/2021 1527  NA 128* 123*  K 3.7 3.5  CL 88* 86*  CO2 28 25  GLUCOSE 123* 440*  BUN 21 31*  CREATININE 0.90 1.38*  CALCIUM 8.9 8.6*   GFR: Estimated Creatinine Clearance: 31.3 mL/min (A) (by C-G formula based on SCr of 1.38 mg/dL (H)). Liver Function Tests: Recent Labs  Lab 04/16/21 0538 04/13/2021 1527  AST 32 40  ALT 73* 55*  ALKPHOS 46 84  BILITOT 0.5 0.5  PROT 6.6 6.6  ALBUMIN 3.1* 2.3*   Recent Labs  Lab 04/12/2021 1527  LIPASE 27   No results for input(s): AMMONIA in the last 168 hours. Coagulation Profile: Recent Labs  Lab 04/16/21 0538 04/16/2021 1527  INR 1.2 2.2*   Cardiac Enzymes: No results for input(s): CKTOTAL, CKMB, CKMBINDEX, TROPONINI in the last 168 hours. BNP (last 3 results) Recent Labs    07/21/20 1215  PROBNP 1,519*   HbA1C: No results for input(s): HGBA1C in the last 72 hours. CBG: Recent Labs  Lab 04/13/2021 1846  GLUCAP 319*   Lipid  Profile: No results for input(s): CHOL, HDL, LDLCALC, TRIG, CHOLHDL, LDLDIRECT in the last 72 hours. Thyroid Function Tests: No results for input(s): TSH, T4TOTAL, FREET4, T3FREE, THYROIDAB in the last 72 hours. Anemia Panel: No results for input(s): VITAMINB12, FOLATE, FERRITIN, TIBC, IRON, RETICCTPCT in the last 72 hours. Urine analysis:    Component Value Date/Time   COLORURINE YELLOW 04/13/2021 1832   APPEARANCEUR HAZY (A) 04/26/2021 1832   LABSPEC 1.015 04/30/2021 1832   PHURINE 5.5 04/16/2021 1832   GLUCOSEU >=500 (A) 04/21/2021 1832   GLUCOSEU NEGATIVE 02/01/2021 0855   HGBUR TRACE (A) 04/11/2021 1832   HGBUR trace-lysed 08/31/2009 [place] 04/13/2021 1832   BILIRUBINUR negative 04/30/2016 [place] 04/09/2021 1832   PROTEINUR 30 (A) 04/21/2021 1832   UROBILINOGEN 0.2 02/01/2021 0855   NITRITE POSITIVE (A) 04/27/2021 1832   LEUKOCYTESUR NEGATIVE 04/10/2021 1832    Radiological Exams on Admission: DG Chest Portable 1 View  Result Date: 04/17/2021 CLINICAL DATA:  Shortness of breath.  Weakness.  Now with cough. EXAM: PORTABLE CHEST 1 VIEW COMPARISON:  02/02/2019 FINDINGS: Stable cardiomediastinal contours. Diffuse coarsened interstitial markings are identified bilaterally. New bilateral lower lung zone airspace opacities are noted, right greater than left. The visualized osseous structures appear intact. Right axillary surgical clips and breast prosthesis noted. IMPRESSION: 1. New bilateral lower lung zone airspace opacities, right greater than left. Imaging findings concerning for multifocal pneumonia. 2. Chronic interstitial lung disease. Electronically Signed   By: Kerby Moors M.D.   On: 04/16/2021 16:11    EKG: Independently reviewed.  Assessment/Plan Principal Problem:   Acute hypoxemic respiratory failure (HCC) Active Problems:   Essential hypertension   Hyponatremia   Diabetes (HCC)   Temporal arteritis (HCC)   Atrial  fibrillation, rapid ([place])   CAP (community acquired pneumonia)    Acute hypoxic resp failure - DDx: CAP CHF Amiodarone pulm tox Cont pulse ox O2 via [place] Treatment and w/u of DDx as below Possible CAP - Pt immunosuppressed on prednisone Hadnt yet started actemra thankfully COVID and flu neg Empiric rocephin + azithro Cultures pending Repeat CBC in AM Possible CHF - History highly suspicious for a rate mediated CHF, also likely retaining fluid chronically no thanks to prednisone, though she is on daily lasix. First want to get rate control of A.Fib RVR: Cont home metoprolol Start cardizem gtt 2d echo in AM Holding off on lasix for now, but will KVO fluids Consider adding lasix in AM if BP tolerates Possible Amiodarone pulm tox - Less likely Holding amiodarone for the moment A.Fib - RVR management as in #3 above Continue eliquis HTN - Cont metoprolol DM2 - Mod scale SSI AC/HS  DVT prophylaxis: Eliquis Code Status: Full Family Communication: No family in room Disposition Plan: Home after breathing improved Consults called: None Admission status: Admit to inpatient  Severity of Illness: The appropriate patient status for this patient is INPATIENT. Inpatient status is judged to be reasonable and necessary in order to provide the required intensity of service to ensure the patient's safety. The patient's presenting symptoms, physical exam findings, and initial radiographic and laboratory data in the context of their chronic comorbidities is felt to place them at high risk for further clinical deterioration. Furthermore, it is not anticipated that the patient will be medically stable for discharge from  the hospital within 2 midnights of admission. The following factors support the patient status of inpatient.   Patient has acute respiratory failure with hypoxia due to having a new oxygen requirement.  That is the patient has a PaO2 < 60 (pulse Ox < 90%) on room air.    * I  certify that at the point of admission it is my clinical judgment that the patient will require inpatient hospital care spanning beyond 2 midnights from the point of admission due to high intensity of service, high risk for further deterioration and high frequency of surveillance required.*   Duquan Gillooly M. DO [facility]  How to contact the [facility] Attending or Consulting provider [place] or covering provider during after hours [place], for this patient?  Check the care team in [facility] and look for a) attending/consulting [facility] provider listed and b) the [facility] team listed Log into [email or web address]  Amion Physician Scheduling and messaging for groups and whole hospitals  On call and physician scheduling software for group practices, residents, hospitalists and other medical providers for call, clinic, rotation and shift schedules. On[employer] is a hospital-wide system for scheduling doctors and paging doctors on call. EasyPlot is for scientific plotting and data analysis.  [email or web address]  and use [place]'s universal password to access. If you do not have the password, please contact the hospital operator.  Locate the [facility] provider you are looking for under [facility] and page to a number that you can be directly reached. If you still have difficulty reaching the provider, please page the [facility] (Director on Call) for the Hospitalists listed on amion for assistance.  04/27/2021, 9:34 PM

## 2021-04-19 NOTE — ED Notes (Addendum)
Report given to jessie at [facility]

## 2021-04-19 NOTE — ED Notes (Signed)
[facility] here to transport pt.   

## 2021-04-19 NOTE — ED Notes (Signed)
Pt is aware that we need urine sample and will let us know when she has to go again.

## 2021-04-19 NOTE — ED Notes (Signed)
Blood CX #2 left hand aerobic only.

## 2021-04-19 NOTE — Assessment & Plan Note (Signed)
With recent high dose prednisone due to start wean to 30 mg daily tomorrow with large wt gain recently

## 2021-04-19 NOTE — Assessment & Plan Note (Signed)
Mild to low 100s only but significant and new for her, likely reactive, cont current meds but should also be addressed urgently as well

## 2021-04-19 NOTE — Progress Notes (Addendum)
Patient ID: Leslie Bryant, female   DOB: 18-Jul-1941, 80 y.o.   MRN: [id number]        Chief Complaint: follow up recent ED visit June 13 and June 10       HPI:  Leslie Bryant is a 80 y.o. female with hx of temporal arteritis high dose prednisone,  here with c/o gradually worsening weakness and recurrent falls over the past 5-6 days and seen in ED twice with worsening sodium to 128 but neg Ct c spine and head ct neg x 2  since last visit has also developed new worsening sob and slight prod cough (not sure of color) but also at last low grade temperature.  Pt denies CP, chills, but does also have slight worsening LE edema in addition to the low sodium, and admits to drinking plenty of fluids, and asked at last ED visit to cut back on oral fluids.  In the office today, she is noted 89% sat on initial intake after resting in waiting room, then 87% with ambulatory o2 sat at 100 yds.  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  Resolved by CT scan Recurrent episode clinical dx 07/2012    Diverticulosis of colon    sigmoid   GERD (gastroesophageal reflux disease)    H/O hiatal hernia    Headache    History of kidney stones    "passed it" (02/19/2017)   Hypercholesterolemia    history   Left knee pain    occasionally   Palpitations IRREGULAR HEARTBEAT--  CONTROLLED W/  BETA  BLOCKER   Pneumonia 2017   "walking pneumonia" (02/19/2017)   Swelling of left knee joint    Past Surgical History:  Procedure Laterality Date   ARTERY BIOPSY Left 01/16/2021   Procedure: BIOPSY TEMPORAL ARTERY;  Surgeon: Leta Baptist, MD;  Location: [facility];  Service: ENT;  Laterality: Left;   BREAST BIOPSY Right 1986   BREAST BIOPSY Left    BREAST RECONSTRUCTION Right 1987   W/ IMPLANTS   CARDIAC CATHETERIZATION  12-22-2000  DR BRACKBILL   NORMAL LVF/ NORMAL CORONARY ARTERIES   CARDIOVERSION N/A 03/23/2020   Procedure: CARDIOVERSION;  Surgeon: Donato Heinz, MD;  Location: [facility] ENDOSCOPY;  Service: Cardiovascular;  Laterality: N/A;   CARDIOVERSION N/A 07/20/2020   Procedure: CARDIOVERSION;  Surgeon: Buford Dresser, MD;  Location: [facility];  Service: Cardiovascular;  Laterality: N/A;   [place]   CHONDROPLASTY  09/08/2012   Procedure: CHONDROPLASTY;  Surgeon: Tobi Bastos, MD;  Location: [facility];  Service: Orthopedics;  Laterality: Left;   COLONOSCOPY     last colon 05-03-2009 (02/19/2017)   COLONOSCOPY W/ BIOPSIES AND POLYPECTOMY  KNEE ARTHROSCOPY WITH MEDIAL MENISECTOMY  09/08/2012   Procedure: KNEE ARTHROSCOPY WITH MEDIAL MENISECTOMY;  Surgeon: Tobi Bastos, MD;  Location: [facility];  Service: Orthopedics;  Laterality: Left;  lateral tibial plateau,    MASTECTOMY Right 1986   W/ MULTIPLE NODE DISSECTION   REDUCTION MAMMAPLASTY Left 1987   TOTAL KNEE ARTHROPLASTY Left 07/21/2013   Procedure: TOTAL KNEE ARTHROPLASTY;  Surgeon: Newt Minion, MD;  Location: [facility];  Service: Orthopedics;  Laterality: Left;  Left Total Knee Arthroplasty   TOTAL KNEE ARTHROPLASTY Right 02/19/2017   Procedure: RIGHT TOTAL KNEE ARTHROPLASTY;  Surgeon: Newt Minion, MD;  Location: [facility];  Service: Orthopedics;  Laterality: Right;   VAGINAL HYSTERECTOMY  1966    reports that she has never smoked. She has never used  smokeless tobacco. She reports previous alcohol use of about 1.0 standard drink of alcohol per week. She reports that she does not use drugs. family history includes Brain cancer in her sister; Cancer in her brother, sister, and sister; Colon cancer in her brother; Dementia in her maternal grandfather and sister; Esophageal cancer in her brother; Heart attack in her father; Heart disease in her daughter; Heart failure in her mother; Leukemia in her brother; Lung cancer in her sister; Rectal cancer in her brother. Allergies  Allergen Reactions   Crestor [Rosuvastatin Calcium] Other (See Comments)    Shoulder pain   Lipitor [Atorvastatin Calcium] Other (See Comments)    Myalgias    Pravachol [Pravastatin] Other (See Comments)    myalgias   Penicillins Itching    Has patient had a PCN reaction causing immediate rash, facial/tongue/throat swelling, SOB or lightheadedness with hypotension: No Has patient had a PCN reaction causing severe rash involving mucus membranes or skin necrosis: No Has patient had a PCN reaction that required hospitalization No Has patient had a PCN reaction occurring within the last 10 years: No If all of the above answers are "NO", then may proceed with Cephalosporin use.    Current Outpatient Medications on File Prior to Visit  Medication Sig Dispense Refill   ACTEMRA 162 MG/0.9ML SOSY Inject into the skin.     alendronate (FOSAMAX) 70 MG tablet Take 70 mg by mouth once a week.     amiodarone (PACERONE) 200 MG tablet Take 1 tablet (200 mg total) by mouth daily. 90 tablet 3   calcium carbonate (OS-CAL - DOSED IN MG OF ELEMENTAL CALCIUM) 1250 (500 Ca) MG tablet Take 1 tablet by mouth daily with breakfast.     co-enzyme Q-10 30 MG capsule Take 30 mg by mouth 2 (two) times daily.     ELIQUIS 5 MG TABS tablet Take 1 tablet by mouth twice daily 60 tablet 5   ezetimibe (ZETIA) 10 MG tablet Take 1 tablet by mouth once daily 90 tablet 3   famotidine (PEPCID) 40 MG tablet TAKE  1 TABLET BY MOUTH AT BEDTIME 90 tablet 0   furosemide (LASIX) 40 MG tablet Take 1 tablet by mouth once daily 90 tablet 3   hyoscyamine (LEVSIN SL) 0.125 MG SL tablet DISSOLVE 1 TABLET IN MOUTH THREE TIMES DAILY AS NEEDED FOR ABDOMINAL CRAMPS 30 tablet 1   ipratropium (ATROVENT) 0.06 % nasal spray as needed.     metoprolol tartrate (LOPRESSOR) 50 MG tablet TAKE 1 & 1/2 (ONE & ONE-HALF) TABLETS BY MOUTH TWICE DAILY 270 tablet 3   Multiple Vitamins-Minerals (CENTRUM SILVER PO) Take 1 tablet by mouth every morning. Align     omeprazole (  PRILOSEC) 40 MG capsule TAKE 1 CAPSULE BY MOUTH TWICE DAILY BEFORE MEAL(S) 180 capsule 1   predniSONE (DELTASONE) 10 MG tablet Take by mouth.     predniSONE (DELTASONE) 20 MG tablet      zolpidem (AMBIEN) 5 MG tablet 1-2 tab by mouth at bedtime as needed 60 tablet 2   No current facility-administered medications on file prior to visit.        ROS:  All others reviewed and negative.  Objective        PE:  BP (!) 142/80 (BP Location: Left Arm, Patient Position: Sitting, Cuff Size: Normal)   Pulse 88   Temp 98.1 F (36.7 C) (Oral)   SpO2 (!) 89%                 Constitutional: Pt appears in NAD               HENT: Head: NCAT.                Right Ear: External ear normal.                 Left Ear: External ear normal.                Eyes: . Pupils are equal, round, and reactive to light. Conjunctivae and EOM are normal; large echymosis noted about the right eye               Nose: without d/c or deformity               Neck: Neck supple. Gross normal ROM               Cardiovascular: mild tachy irregular rhythm.                 Pulmonary/Chest: Effort normal and breath sounds with large area bilateral lower half lung field rales, but no wheezing.                Abd:  Soft, NT, ND, + BS, no organomegaly               Neurological: Pt is alert. At baseline orientation, motor grossly intact               Skin: Skin is warm. No rashes, no other new lesions, LE  edema -trace bilateral               Psychiatric: Pt behavior is normal without agitation   Micro: none  Cardiac tracings I have personally interpreted today:  atrial fib at 108  Pertinent Radiological findings (summarize): none   Lab Results  Component Value Date   WBC 9.1 04/16/2021   HGB 11.8 (L) 04/16/2021   HCT 33.1 (L) 04/16/2021   PLT 154 04/16/2021   GLUCOSE 123 (H) 04/16/2021   CHOL 163 05/03/2020   TRIG 228.0 (H) 05/03/2020   HDL 43.70 05/03/2020   LDLDIRECT 104.0 05/03/2020   LDLCALC 116 (H) 11/09/2019   ALT 73 (H) 04/16/2021   AST 32 04/16/2021   NA 128 (L) 04/16/2021   K 3.7 04/16/2021   CL 88 (L) 04/16/2021   CREATININE 0.90 04/16/2021   BUN 21 04/16/2021   CO2 28 04/16/2021   TSH 1.690 01/02/2021   INR 1.2 04/16/2021   HGBA1C 7.1 (H) 02/01/2021   MICROALBUR <0.7 05/03/2020   Assessment/Plan:  TOWANA STENGLEIN is a 80 y.o. White or Caucasian [1] female with  has a past medical  history of Acute meniscal tear of knee (LEFT KNEE), Allergic rhinitis, Allergy, Arthritis, Atrial fibrillation ([place]), Breast cancer, right breast ([place]) (1986), Bright's disease, Diverticulitis of large intestine with perforation (11/02/2011), Diverticulosis of colon, GERD (gastroesophageal reflux disease), H/O hiatal hernia, Headache, History of kidney stones, Hypercholesterolemia, Left knee pain, Palpitations (IRREGULAR HEARTBEAT--  CONTROLLED W/  BETA BLOCKER), Pneumonia (2017), and Swelling of left knee joint.  Acute hypoxemic respiratory failure (HCC) Mild but best to go to ED for further evaluation to likely include cxr and/or CTA chest; I have high suspicion for bilateral pulmonary edema and/or bilateral PNA given relative immunodeficient state;  Last cxr mar 2022 was also somewhat suggestive of interstitial course markings as well so cant r/o element of ILD as well  Atrial fibrillation, rapid (HCC) Mild to low 100s only but significant and new for her, likely reactive, cont current  meds but should also be addressed urgently as well  Temporal arteritis ([place]) With recent high dose prednisone due to start wean to 30 mg daily tomorrow with large wt gain recently  Followup: Return if symptoms worsen or fail to improve.  Leslie Cower, MD 04/13/2021 2:26 PM [facility]

## 2021-04-19 NOTE — Patient Instructions (Signed)
Please go to ED  now

## 2021-04-19 NOTE — Assessment & Plan Note (Addendum)
Mild but best to go to ED for further evaluation to likely include cxr and/or CTA chest; I have high suspicion for bilateral pulmonary edema and/or bilateral PNA given relative immunodeficient state;  Last cxr mar 2022 was also somewhat suggestive of interstitial course markings as well so cant r/o element of ILD as well

## 2021-04-19 NOTE — Addendum Note (Signed)
Addended by: Marcina Millard on: 04/26/2021 03:46 PM   Modules accepted: Orders

## 2021-04-20 ENCOUNTER — Ambulatory Visit: Payer: Medicare Other | Admitting: Internal Medicine

## 2021-04-20 ENCOUNTER — Inpatient Hospital Stay: Payer: Self-pay

## 2021-04-20 ENCOUNTER — Inpatient Hospital Stay (HOSPITAL_COMMUNITY): Payer: Medicare Other

## 2021-04-20 DIAGNOSIS — J9601 Acute respiratory failure with hypoxia: Secondary | ICD-10-CM

## 2021-04-20 DIAGNOSIS — I5022 Chronic systolic (congestive) heart failure: Secondary | ICD-10-CM

## 2021-04-20 DIAGNOSIS — J80 Acute respiratory distress syndrome: Secondary | ICD-10-CM

## 2021-04-20 DIAGNOSIS — E1165 Type 2 diabetes mellitus with hyperglycemia: Secondary | ICD-10-CM

## 2021-04-20 LAB — CBC
HCT: 43.3 % (ref 36.0–46.0)
HCT: 29.6 % — ABNORMAL LOW (ref 36.0–46.0)
Hemoglobin: 15.2 g/dL — ABNORMAL HIGH (ref 12.0–15.0)
Hemoglobin: 9.9 g/dL — ABNORMAL LOW (ref 12.0–15.0)
MCH: 32 pg (ref 26.0–34.0)
MCH: 32.8 pg (ref 26.0–34.0)
MCHC: 35.1 g/dL (ref 30.0–36.0)
MCHC: 33.4 g/dL (ref 30.0–36.0)
MCV: 91.2 fL (ref 80.0–100.0)
MCV: 98 fL (ref 80.0–100.0)
Platelets: 144 10*3/uL — ABNORMAL LOW (ref 150–400)
Platelets: 242 10*3/uL (ref 150–400)
RBC: 4.75 MIL/uL (ref 3.87–5.11)
RBC: 3.02 MIL/uL — ABNORMAL LOW (ref 3.87–5.11)
RDW: 13.8 % (ref 11.5–15.5)
RDW: 14.7 % (ref 11.5–15.5)
WBC: 10.6 10*3/uL — ABNORMAL HIGH (ref 4.0–10.5)
WBC: 21.6 10*3/uL — ABNORMAL HIGH (ref 4.0–10.5)
nRBC: 1.3 % — ABNORMAL HIGH (ref 0.0–0.2)
nRBC: 1.3 % — ABNORMAL HIGH (ref 0.0–0.2)

## 2021-04-20 LAB — COMPREHENSIVE METABOLIC PANEL
ALT: 48 U/L — ABNORMAL HIGH (ref 0–44)
ALT: 51 U/L — ABNORMAL HIGH (ref 0–44)
AST: 31 U/L (ref 15–41)
AST: 62 U/L — ABNORMAL HIGH (ref 15–41)
Albumin: 2.3 g/dL — ABNORMAL LOW (ref 3.5–5.0)
Albumin: 2.2 g/dL — ABNORMAL LOW (ref 3.5–5.0)
Alkaline Phosphatase: 63 U/L (ref 38–126)
Alkaline Phosphatase: 72 U/L (ref 38–126)
Anion gap: 12 (ref 5–15)
Anion gap: 11 (ref 5–15)
BUN: 23 mg/dL (ref 8–23)
BUN: 26 mg/dL — ABNORMAL HIGH (ref 8–23)
CO2: 24 mmol/L (ref 22–32)
CO2: 26 mmol/L (ref 22–32)
Calcium: 8.5 mg/dL — ABNORMAL LOW (ref 8.9–10.3)
Calcium: 8 mg/dL — ABNORMAL LOW (ref 8.9–10.3)
Chloride: 95 mmol/L — ABNORMAL LOW (ref 98–111)
Chloride: 96 mmol/L — ABNORMAL LOW (ref 98–111)
Creatinine, Ser: 1.16 mg/dL — ABNORMAL HIGH (ref 0.44–1.00)
Creatinine, Ser: 1.45 mg/dL — ABNORMAL HIGH (ref 0.44–1.00)
GFR, Estimated: 48 mL/min — ABNORMAL LOW (ref >60)
GFR, Estimated: 36 mL/min — ABNORMAL LOW (ref >60)
Glucose, Bld: 161 mg/dL — ABNORMAL HIGH (ref 70–99)
Glucose, Bld: 275 mg/dL — ABNORMAL HIGH (ref 70–99)
Potassium: 3.3 mmol/L — ABNORMAL LOW (ref 3.5–5.1)
Potassium: 4.2 mmol/L (ref 3.5–5.1)
Sodium: 131 mmol/L — ABNORMAL LOW (ref 135–145)
Sodium: 133 mmol/L — ABNORMAL LOW (ref 135–145)
Total Bilirubin: 0.7 mg/dL (ref 0.3–1.2)
Total Bilirubin: 0.7 mg/dL (ref 0.3–1.2)
Total Protein: 6.4 g/dL — ABNORMAL LOW (ref 6.5–8.1)
Total Protein: 6.5 g/dL (ref 6.5–8.1)

## 2021-04-20 LAB — BRAIN NATRIURETIC PEPTIDE: B Natriuretic Peptide: 531.2 pg/mL — ABNORMAL HIGH (ref 0.0–100.0)

## 2021-04-20 LAB — BLOOD GAS, ARTERIAL
Acid-Base Excess: 0.6 mmol/L (ref 0.0–2.0)
Acid-base deficit: 1.6 mmol/L (ref 0.0–2.0)
Acid-base deficit: 2.8 mmol/L — ABNORMAL HIGH (ref 0.0–2.0)
Bicarbonate: 22.1 mmol/L (ref 20.0–28.0)
Bicarbonate: 20.8 mmol/L (ref 20.0–28.0)
Bicarbonate: 25.3 mmol/L (ref 20.0–28.0)
Drawn by: 331471
FIO2: 44
FIO2: 100
MECHVT: 300 mL
O2 Content: 6 L/min
O2 Saturation: 90.1 %
O2 Saturation: 86.1 %
O2 Saturation: 90.6 %
Patient temperature: 98.6
Patient temperature: 98.6
Patient temperature: 99.9
RATE: 35 resp/min
pCO2 arterial: 29.7 mmHg — ABNORMAL LOW (ref 32.0–48.0)
pCO2 arterial: 28.1 mmHg — ABNORMAL LOW (ref 32.0–48.0)
pCO2 arterial: 63.9 mmHg — ABNORMAL HIGH (ref 32.0–48.0)
pH, Arterial: 7.484 — ABNORMAL HIGH (ref 7.350–7.450)
pH, Arterial: 7.481 — ABNORMAL HIGH (ref 7.350–7.450)
pH, Arterial: 7.221 — ABNORMAL LOW (ref 7.350–7.450)
pO2, Arterial: 58 mmHg — ABNORMAL LOW (ref 83.0–108.0)
pO2, Arterial: 48.7 mmHg — ABNORMAL LOW (ref 83.0–108.0)
pO2, Arterial: 73.7 mmHg — ABNORMAL LOW (ref 83.0–108.0)

## 2021-04-20 LAB — TROPONIN I (HIGH SENSITIVITY): Troponin I (High Sensitivity): 23 ng/L — ABNORMAL HIGH (ref <18)

## 2021-04-20 LAB — ECHOCARDIOGRAM COMPLETE
Area-P 1/2: 4.06 cm2
Height: 62 in
S' Lateral: 2.8 cm
Weight: 2634.94 oz

## 2021-04-20 LAB — GLUCOSE, CAPILLARY
Glucose-Capillary: 178 mg/dL — ABNORMAL HIGH (ref 70–99)
Glucose-Capillary: 289 mg/dL — ABNORMAL HIGH (ref 70–99)
Glucose-Capillary: 221 mg/dL — ABNORMAL HIGH (ref 70–99)
Glucose-Capillary: 232 mg/dL — ABNORMAL HIGH (ref 70–99)
Glucose-Capillary: 270 mg/dL — ABNORMAL HIGH (ref 70–99)

## 2021-04-20 LAB — HIV ANTIBODY (ROUTINE TESTING W REFLEX): HIV Screen 4th Generation wRfx: NONREACTIVE

## 2021-04-20 LAB — LACTIC ACID, PLASMA: Lactic Acid, Venous: 2.4 mmol/L (ref 0.5–1.9)

## 2021-04-20 LAB — T4, FREE: Free T4: 2.27 ng/dL — ABNORMAL HIGH (ref 0.61–1.12)

## 2021-04-20 LAB — MRSA PCR SCREENING: MRSA by PCR: NEGATIVE

## 2021-04-20 LAB — SODIUM, URINE, RANDOM: Sodium, Ur: 29 mmol/L

## 2021-04-20 LAB — OSMOLALITY, URINE: Osmolality, Ur: 573 mOsm/kg (ref 300–900)

## 2021-04-20 MED ORDER — POLYETHYLENE GLYCOL 3350 17 G PO PACK
17.0000 g | PACK | Freq: Every day | ORAL | Status: DC
  Administered 2021-04-21 – 2021-04-24 (×4): 17 g
  Filled 2021-04-20 (×4): qty 1

## 2021-04-20 MED ORDER — PHENYLEPHRINE HCL-NACL 10-0.9 MG/250ML-% IV SOLN
INTRAVENOUS | Status: AC
  Filled 2021-04-20: qty 250

## 2021-04-20 MED ORDER — IPRATROPIUM BROMIDE 0.02 % IN SOLN
0.5000 mg | Freq: Four times a day (QID) | RESPIRATORY_TRACT | Status: DC
  Administered 2021-04-20 – 2021-05-07 (×69): 0.5 mg via RESPIRATORY_TRACT
  Filled 2021-04-20 (×68): qty 2.5

## 2021-04-20 MED ORDER — ROCURONIUM BROMIDE 10 MG/ML (PF) SYRINGE
1.0000 mg/kg | PREFILLED_SYRINGE | INTRAVENOUS | Status: DC | PRN
  Administered 2021-04-21 (×2): 74.7 mg via INTRAVENOUS
  Filled 2021-04-20 (×2): qty 10

## 2021-04-20 MED ORDER — POTASSIUM CHLORIDE IN NACL 40-0.9 MEQ/L-% IV SOLN
INTRAVENOUS | Status: DC
  Filled 2021-04-20: qty 1000

## 2021-04-20 MED ORDER — ROCURONIUM BROMIDE 10 MG/ML (PF) SYRINGE
PREFILLED_SYRINGE | INTRAVENOUS | Status: AC
  Administered 2021-04-20: 70 mg
  Filled 2021-04-20: qty 10

## 2021-04-20 MED ORDER — FENTANYL CITRATE (PF) 100 MCG/2ML IJ SOLN: 25.0000 ug | INTRAMUSCULAR | Status: DC | PRN

## 2021-04-20 MED ORDER — SODIUM CHLORIDE 0.9 % IV SOLN: INTRAVENOUS | Status: DC | PRN

## 2021-04-20 MED ORDER — HYDROCORTISONE NA SUCCINATE PF 100 MG IJ SOLR
50.0000 mg | Freq: Three times a day (TID) | INTRAMUSCULAR | Status: DC
  Administered 2021-04-20 – 2021-04-22 (×5): 50 mg via INTRAVENOUS
  Filled 2021-04-20 (×5): qty 2

## 2021-04-20 MED ORDER — POTASSIUM CHLORIDE CRYS ER 20 MEQ PO TBCR
40.0000 meq | EXTENDED_RELEASE_TABLET | Freq: Once | ORAL | Status: AC
  Administered 2021-04-20: 40 meq via ORAL
  Filled 2021-04-20: qty 2

## 2021-04-20 MED ORDER — ETOMIDATE 2 MG/ML IV SOLN
INTRAVENOUS | Status: AC
  Administered 2021-04-20: 40 mg
  Filled 2021-04-20: qty 20

## 2021-04-20 MED ORDER — MIDAZOLAM HCL 2 MG/2ML IJ SOLN
INTRAMUSCULAR | Status: AC
  Filled 2021-04-20: qty 2

## 2021-04-20 MED ORDER — FENTANYL CITRATE (PF) 100 MCG/2ML IJ SOLN
INTRAMUSCULAR | Status: AC
  Filled 2021-04-20: qty 2

## 2021-04-20 MED ORDER — FUROSEMIDE 10 MG/ML IJ SOLN
40.0000 mg | Freq: Three times a day (TID) | INTRAMUSCULAR | Status: DC
  Administered 2021-04-20: 40 mg via INTRAVENOUS
  Filled 2021-04-20: qty 4

## 2021-04-20 MED ORDER — PHENYLEPHRINE HCL-NACL 10-0.9 MG/250ML-% IV SOLN
25.0000 ug/min | INTRAVENOUS | Status: DC
  Administered 2021-04-20: 25 ug/min via INTRAVENOUS
  Administered 2021-04-21: 75 ug/min via INTRAVENOUS
  Filled 2021-04-20: qty 250

## 2021-04-20 MED ORDER — INSULIN ASPART 100 UNIT/ML IJ SOLN
0.0000 [IU] | INTRAMUSCULAR | Status: DC
  Administered 2021-04-20: 8 [IU] via SUBCUTANEOUS
  Administered 2021-04-21: 3 [IU] via SUBCUTANEOUS
  Administered 2021-04-21 (×2): 5 [IU] via SUBCUTANEOUS
  Administered 2021-04-21: 3 [IU] via SUBCUTANEOUS
  Administered 2021-04-21 – 2021-04-22 (×5): 5 [IU] via SUBCUTANEOUS
  Administered 2021-04-22: 3 [IU] via SUBCUTANEOUS
  Administered 2021-04-22: 5 [IU] via SUBCUTANEOUS
  Administered 2021-04-23 (×2): 8 [IU] via SUBCUTANEOUS
  Administered 2021-04-23: 5 [IU] via SUBCUTANEOUS
  Administered 2021-04-23: 8 [IU] via SUBCUTANEOUS
  Administered 2021-04-23 (×3): 5 [IU] via SUBCUTANEOUS
  Administered 2021-04-24: 8 [IU] via SUBCUTANEOUS
  Administered 2021-04-24: 5 [IU] via SUBCUTANEOUS
  Administered 2021-04-24: 3 [IU] via SUBCUTANEOUS
  Administered 2021-04-24 (×3): 5 [IU] via SUBCUTANEOUS
  Administered 2021-04-25: 2 [IU] via SUBCUTANEOUS
  Administered 2021-04-25: 5 [IU] via SUBCUTANEOUS
  Administered 2021-04-25 (×2): 3 [IU] via SUBCUTANEOUS
  Administered 2021-04-25: 2 [IU] via SUBCUTANEOUS
  Administered 2021-04-25 – 2021-04-26 (×2): 3 [IU] via SUBCUTANEOUS
  Administered 2021-04-26: 2 [IU] via SUBCUTANEOUS
  Administered 2021-04-26: 3 [IU] via SUBCUTANEOUS
  Administered 2021-04-26 (×2): 2 [IU] via SUBCUTANEOUS
  Administered 2021-04-27: 3 [IU] via SUBCUTANEOUS
  Administered 2021-04-27: 5 [IU] via SUBCUTANEOUS
  Administered 2021-04-27 (×3): 3 [IU] via SUBCUTANEOUS
  Administered 2021-04-27: 5 [IU] via SUBCUTANEOUS
  Administered 2021-04-27: 3 [IU] via SUBCUTANEOUS
  Administered 2021-04-28: 8 [IU] via SUBCUTANEOUS
  Administered 2021-04-28 (×2): 3 [IU] via SUBCUTANEOUS
  Administered 2021-04-28: 5 [IU] via SUBCUTANEOUS
  Administered 2021-04-28: 3 [IU] via SUBCUTANEOUS
  Administered 2021-04-29 (×2): 5 [IU] via SUBCUTANEOUS
  Administered 2021-04-29: 8 [IU] via SUBCUTANEOUS
  Administered 2021-04-29: 2 [IU] via SUBCUTANEOUS
  Administered 2021-04-29: 5 [IU] via SUBCUTANEOUS
  Administered 2021-04-29: 8 [IU] via SUBCUTANEOUS
  Administered 2021-04-30: 3 [IU] via SUBCUTANEOUS
  Administered 2021-04-30: 15 [IU] via SUBCUTANEOUS
  Administered 2021-04-30: 8 [IU] via SUBCUTANEOUS
  Administered 2021-04-30 – 2021-05-01 (×4): 5 [IU] via SUBCUTANEOUS
  Administered 2021-05-01 – 2021-05-02 (×5): 3 [IU] via SUBCUTANEOUS
  Administered 2021-05-02: 5 [IU] via SUBCUTANEOUS
  Administered 2021-05-02 (×3): 3 [IU] via SUBCUTANEOUS
  Administered 2021-05-02: 2 [IU] via SUBCUTANEOUS
  Administered 2021-05-03: 5 [IU] via SUBCUTANEOUS
  Administered 2021-05-03: 2 [IU] via SUBCUTANEOUS
  Administered 2021-05-03: 3 [IU] via SUBCUTANEOUS
  Administered 2021-05-03 (×2): 5 [IU] via SUBCUTANEOUS
  Administered 2021-05-04 (×2): 2 [IU] via SUBCUTANEOUS
  Administered 2021-05-04 (×4): 3 [IU] via SUBCUTANEOUS
  Administered 2021-05-05 (×3): 2 [IU] via SUBCUTANEOUS
  Administered 2021-05-05: 3 [IU] via SUBCUTANEOUS
  Administered 2021-05-05 (×2): 2 [IU] via SUBCUTANEOUS
  Administered 2021-05-06: 3 [IU] via SUBCUTANEOUS
  Administered 2021-05-06 (×2): 2 [IU] via SUBCUTANEOUS
  Administered 2021-05-06 – 2021-05-07 (×3): 3 [IU] via SUBCUTANEOUS

## 2021-04-20 MED ORDER — ORAL CARE MOUTH RINSE
15.0000 mL | OROMUCOSAL | Status: DC
  Administered 2021-04-21 – 2021-05-08 (×162): 15 mL via OROMUCOSAL

## 2021-04-20 MED ORDER — PHENYLEPHRINE 40 MCG/ML (10ML) SYRINGE FOR IV PUSH (FOR BLOOD PRESSURE SUPPORT)
PREFILLED_SYRINGE | INTRAVENOUS | Status: AC
  Administered 2021-04-20: 400 ug
  Filled 2021-04-20: qty 10

## 2021-04-20 MED ORDER — PROPOFOL 500 MG/50ML IV EMUL
INTRAVENOUS | Status: AC
  Filled 2021-04-20: qty 50

## 2021-04-20 MED ORDER — PHENYLEPHRINE HCL-NACL 10-0.9 MG/250ML-% IV SOLN: 0.0000 ug/min | INTRAVENOUS | Status: DC

## 2021-04-20 MED ORDER — SODIUM CHLORIDE 0.9 % IV SOLN: INTRAVENOUS | Status: DC

## 2021-04-20 MED ORDER — STERILE WATER FOR INJECTION IJ SOLN
INTRAMUSCULAR | Status: AC
  Filled 2021-04-20: qty 10

## 2021-04-20 MED ORDER — LIDOCAINE HCL (CARDIAC) PF 100 MG/5ML IV SOSY
PREFILLED_SYRINGE | INTRAVENOUS | Status: AC
  Filled 2021-04-20: qty 5

## 2021-04-20 MED ORDER — LACTATED RINGERS IV BOLUS
1000.0000 mL | Freq: Once | INTRAVENOUS | Status: AC
  Administered 2021-04-20: 1000 mL via INTRAVENOUS

## 2021-04-20 MED ORDER — FENTANYL BOLUS VIA INFUSION
25.0000 ug | INTRAVENOUS | Status: DC | PRN
  Administered 2021-04-21 – 2021-04-23 (×5): 50 ug via INTRAVENOUS
  Administered 2021-04-23: 25 ug via INTRAVENOUS
  Administered 2021-04-23: 50 ug via INTRAVENOUS
  Administered 2021-04-23 (×2): 25 ug via INTRAVENOUS
  Administered 2021-04-24 (×5): 50 ug via INTRAVENOUS
  Administered 2021-04-24 (×2): 100 ug via INTRAVENOUS
  Administered 2021-04-24 – 2021-04-26 (×3): 50 ug via INTRAVENOUS
  Administered 2021-04-26 – 2021-04-29 (×4): 75 ug via INTRAVENOUS
  Administered 2021-04-29: 50 ug via INTRAVENOUS
  Filled 2021-04-20: qty 100

## 2021-04-20 MED ORDER — MIDAZOLAM 50MG/50ML (1MG/ML) PREMIX INFUSION
0.0000 mg/h | INTRAVENOUS | Status: DC
  Administered 2021-04-20: 1 mg/h via INTRAVENOUS
  Administered 2021-04-20: 2 mg/h via INTRAVENOUS
  Administered 2021-04-21: 4 mg/h via INTRAVENOUS
  Administered 2021-04-22: 1.5 mg/h via INTRAVENOUS
  Filled 2021-04-20 (×4): qty 50

## 2021-04-20 MED ORDER — FENTANYL CITRATE (PF) 100 MCG/2ML IJ SOLN
INTRAMUSCULAR | Status: AC
  Administered 2021-04-20: 100 ug
  Filled 2021-04-20: qty 2

## 2021-04-20 MED ORDER — SODIUM CHLORIDE 0.9 % IV BOLUS: 1000.0000 mL | Freq: Once | INTRAVENOUS | Status: DC

## 2021-04-20 MED ORDER — MIDAZOLAM BOLUS VIA INFUSION
0.0000 mg | INTRAVENOUS | Status: DC | PRN
  Administered 2021-04-21: 2 mg via INTRAVENOUS
  Filled 2021-04-20: qty 5

## 2021-04-20 MED ORDER — SODIUM CHLORIDE 0.9 % IV SOLN
250.0000 mL | INTRAVENOUS | Status: DC
  Administered 2021-04-20: 1000 mL via INTRAVENOUS
  Administered 2021-04-25: 250 mL via INTRAVENOUS

## 2021-04-20 MED ORDER — MIDAZOLAM HCL 2 MG/2ML IJ SOLN
INTRAMUSCULAR | Status: AC
  Administered 2021-04-20: 4 mg
  Filled 2021-04-20: qty 2

## 2021-04-20 MED ORDER — ACETAMINOPHEN 650 MG RE SUPP: 650.0000 mg | Freq: Four times a day (QID) | RECTAL | Status: DC | PRN

## 2021-04-20 MED ORDER — SODIUM BICARBONATE 8.4 % IV SOLN
INTRAVENOUS | Status: AC
  Administered 2021-04-20: 50 meq
  Filled 2021-04-20: qty 100

## 2021-04-20 MED ORDER — LEVALBUTEROL HCL 0.63 MG/3ML IN NEBU
0.6300 mg | INHALATION_SOLUTION | Freq: Four times a day (QID) | RESPIRATORY_TRACT | Status: DC
  Administered 2021-04-20 – 2021-05-07 (×69): 0.63 mg via RESPIRATORY_TRACT
  Filled 2021-04-20 (×68): qty 3

## 2021-04-20 MED ORDER — FENTANYL 2500MCG IN NS 250ML (10MCG/ML) PREMIX INFUSION
0.0000 ug/h | INTRAVENOUS | Status: DC
  Administered 2021-04-20: 25 ug/h via INTRAVENOUS
  Administered 2021-04-21 – 2021-04-22 (×2): 175 ug/h via INTRAVENOUS
  Administered 2021-04-24: 100 ug/h via INTRAVENOUS
  Administered 2021-04-24 – 2021-04-25 (×2): 125 ug/h via INTRAVENOUS
  Administered 2021-04-26 – 2021-04-28 (×4): 150 ug/h via INTRAVENOUS
  Administered 2021-04-28: 125 ug/h via INTRAVENOUS
  Administered 2021-04-29: 75 ug/h via INTRAVENOUS
  Filled 2021-04-20 (×13): qty 250

## 2021-04-20 MED ORDER — ACETAMINOPHEN 325 MG PO TABS
650.0000 mg | ORAL_TABLET | Freq: Four times a day (QID) | ORAL | Status: DC | PRN
  Filled 2021-04-20: qty 2

## 2021-04-20 MED ORDER — DOCUSATE SODIUM 50 MG/5ML PO LIQD
100.0000 mg | Freq: Two times a day (BID) | ORAL | Status: DC
  Administered 2021-04-21 – 2021-04-24 (×9): 100 mg
  Filled 2021-04-20 (×9): qty 10

## 2021-04-20 MED ORDER — DOCUSATE SODIUM 50 MG/5ML PO LIQD: 100.0000 mg | Freq: Two times a day (BID) | ORAL | Status: DC

## 2021-04-20 MED ORDER — PROPOFOL 1000 MG/100ML IV EMUL: 0.0000 ug/kg/min | INTRAVENOUS | Status: DC

## 2021-04-20 MED ORDER — PERFLUTREN LIPID MICROSPHERE
1.0000 mL | INTRAVENOUS | Status: AC | PRN
  Administered 2021-04-20: 2 mL via INTRAVENOUS
  Filled 2021-04-20: qty 10

## 2021-04-20 MED ORDER — CHLORHEXIDINE GLUCONATE 0.12% ORAL RINSE (MEDLINE KIT)
15.0000 mL | Freq: Two times a day (BID) | OROMUCOSAL | Status: DC
  Administered 2021-04-21 – 2021-05-08 (×35): 15 mL via OROMUCOSAL

## 2021-04-20 MED ORDER — LACTATED RINGERS IV BOLUS: 1000.0000 mL | Freq: Once | INTRAVENOUS | Status: DC

## 2021-04-20 MED ORDER — FENTANYL CITRATE (PF) 100 MCG/2ML IJ SOLN: 25.0000 ug | Freq: Once | INTRAMUSCULAR | Status: DC

## 2021-04-20 MED ORDER — VECURONIUM BROMIDE 10 MG IV SOLR
INTRAVENOUS | Status: AC
  Filled 2021-04-20: qty 10

## 2021-04-20 MED ORDER — POLYETHYLENE GLYCOL 3350 17 G PO PACK: 17.0000 g | PACK | Freq: Every day | ORAL | Status: DC

## 2021-04-20 NOTE — Progress Notes (Signed)
Peripherally Inserted Central Catheter Placement  The IV Nurse has discussed with the patient and/or persons authorized to consent for the patient, the purpose of this procedure and the potential benefits and risks involved with this procedure.  The benefits include less needle sticks, lab draws from the catheter, and the patient may be discharged home with the catheter. Risks include, but not limited to, infection, bleeding, blood clot (thrombus formation), and puncture of an artery; nerve damage and irregular heartbeat and possibility to perform a PICC exchange if needed/ordered by physician.  Alternatives to this procedure were also discussed. Consent signed by son at bedside.  PICC Placement Documentation    LUE PICC insertion unsuccessful after two attempts. ICU RN made aware.    Rosalio Macadamia Chenice 04/20/2021, 9:14 PM

## 2021-04-20 NOTE — TOC Initial Note (Signed)
Transition of Care [facility]) - Initial/Assessment Note    Patient Details  Name: Leslie Bryant MRN: [id number] Date of Birth: 03/21/1941  [facility] CM/SW Contact:    Leeroy Cha, RN Phone Number: 04/20/2021, 7:57 AM  Clinical Narrative:                  80 y.o. female with medical history significant of A.Fib, HTN, GCA on prednisone.   Pt presents to ED with c/o generalized weakness, SOB, subjective fever.   Pt went to PCP today with recurrent falls over past several days.  SOB, orthopnea, DOE.   Mild worsening of BLE edema.   Large amount of wt gain over past couple of months due to prednisone for GCA.   No CP, no palpitations, no abd pain, no dysuria.     ED Course: Satting 85% on RA.   CXR reveals BLL infiltrates.   BNP 467 up from 156 in May 21.   Trops 20 and 20.   Sodium 123, BGL 440, creat 1.38   WBC 15.7k   COVID and flu neg.   Pt in A.Fib RVR with rates 130-150.   Afebrile. PLAN: to return to home as before hoispitalization, may need home o2 if not already present will follow for progression and needs. [id number]/min, iv Cardizem and iv abx. Expected Discharge Plan: Home/Self Care Barriers to Discharge: Continued Medical Work up   Patient Goals and CMS Choice Patient states their goals for this hospitalization and ongoing recovery are:: to go home CMS Medicare.gov Compare Post Acute Care list provided to:: Patient    Expected Discharge Plan and Services Expected Discharge Plan: Home/Self Care   Discharge Planning Services: CM Consult   Living arrangements for the past 2 months: Single Family Home                                      Prior Living Arrangements/Services Living arrangements for the past 2 months: Single Family Home Lives with:: Spouse Patient language and need for interpreter reviewed:: Yes Do you feel safe going back to the place where you live?: Yes            Criminal Activity/Legal Involvement  Pertinent to Current Situation/Hospitalization: No - Comment as needed  Activities of Daily Living Home Assistive Devices/Equipment: Eyeglasses, Gilford Rile (specify type) ADL Screening (condition at time of admission) Patient's cognitive ability adequate to safely complete daily activities?: Yes Is the patient deaf or have difficulty hearing?: No Does the patient have difficulty seeing, even when wearing glasses/contacts?: Yes (some vision problems - may be cataracts) Does the patient have difficulty concentrating, remembering, or making decisions?: No Patient able to express need for assistance with ADLs?: Yes Does the patient have difficulty dressing or bathing?: No Independently performs ADLs?: No Communication: Independent Dressing (OT): Independent Grooming: Independent Feeding: Independent Bathing: Independent Toileting: Needs assistance Is this a change from baseline?: Pre-admission baseline In/Out Bed: Needs assistance Is this a change from baseline?: Pre-admission baseline Walks in Home: Needs assistance Is this a change from baseline?: Pre-admission baseline Does the patient have difficulty walking or climbing stairs?: Yes Weakness of Legs: Both Weakness of Arms/Hands: Both  Permission Sought/Granted                  Emotional Assessment Appearance:: Appears stated age Attitude/Demeanor/Rapport: Engaged Affect (typically observed): Calm Orientation: : Oriented to Place, Oriented to Self, Oriented  to  Time, Oriented to Situation Alcohol / Substance Use: Not Applicable Psych Involvement: No (comment)  Admission diagnosis:  Hyponatremia [E87.1] Pneumonia [J18.9] Hyperglycemia [R73.9] AKI (acute kidney injury) ([place]) [N17.9] Atrial fibrillation with RVR (HCC) [I48.91] Multifocal pneumonia [J18.9] Patient Active Problem List   Diagnosis Date Noted   Acute hypoxemic respiratory failure ([place]) 04/13/2021   Atrial fibrillation, rapid ([place]) 04/22/2021   CAP (community  acquired pneumonia) 04/06/2021   Temporal arteritis ([place]) 02/04/2021   Elevated LFTs 02/01/2021   Renal insufficiency 02/01/2021   Leukocytosis 02/01/2021   Atrial fibrillation ([place]) 02/15/2020   Secondary hypercoagulable state ([place]) 02/15/2020   Nocturia 11/11/2019   Hematochezia 11/09/2019   Ganglion, finger joint of right hand 10/08/2019   Osteoarthritis of finger of left hand 10/08/2019   Acute sinus infection 09/11/2019   Left otitis media 07/28/2019   Venous insufficiency 02/09/2018   Chest pain 09/22/2017   Neck pain 09/22/2017   Abdominal pain, left lower quadrant 04/03/2017   Diarrhea 04/03/2017   Weight loss 04/03/2017   Total knee replacement status, right 02/19/2017   Bilateral otitis media with effusion 01/23/2017   Preoperative cardiovascular examination 12/24/2016   Hyponatremia 11/06/2016   Diabetes ([place]) 11/06/2016   Dysuria 04/30/2016   Plantar fasciitis of right foot 12/28/2014   Metatarsal deformity 12/28/2014   Unspecified deficiency anemia 06/09/2013   Anemia, unspecified 05/06/2013   Lower back pain 02/01/2013   Osteoarthritis of left knee 09/08/2012   Complex tear of medial meniscus of left knee as current injury 09/08/2012   Abdominal pain, lower 07/23/2012   Encounter for well adult exam with abnormal findings 05/05/2012   Family hx of colon cancer 05/05/2012   Unilateral primary osteoarthritis, right knee 02/06/2011   History of breast cancer 02/06/2011   Palpitations    Supraventricular tachycardia ([place])    EUSTACHIAN TUBE DYSFUNCTION, LEFT 04/11/2010   Anxiety state 05/05/2008   Essential hypertension 05/05/2008   Allergic rhinitis 05/05/2008   IBS 05/05/2008   OSTEOPENIA 05/05/2008   COLONIC POLYPS, HX OF 05/05/2008   Hyperlipidemia 05/29/2007   GERD 05/29/2007   PCP:  Biagio Borg, MD Pharmacy:   [facility], [place] [address] Phone: [phone] Fax:  [phone]     Social Determinants of Health (SDOH) Interventions    Readmission Risk Interventions No flowsheet data found.

## 2021-04-20 NOTE — Progress Notes (Signed)
[facility] Progress Note Patient Name: Leslie Bryant DOB: [birth year] MRN: [id number]   Date of Service  04/20/2021  HPI/Events of Note  Patient with severe acute hypoxemic respiratory failure in the context of severe bilateral lung infiltrates Patient is s/p intubation.Marland Kitchen  eICU Interventions  ETT to be pulled back 2 cm, arterial line by RT, will f/u ABG and adjust ventilator settings accordingly.        Kerry Kass Mattye Verdone 04/20/2021, 7:46 PM

## 2021-04-20 NOTE — Progress Notes (Signed)
Interim progress note   Patient had respiratory failure that is rapidly worsening in a matter of hours.  Respiratory rate was 50 and the time of my evaluation on BiPAP although not paradoxical.  She was awake and alert although a little more drowsy compared to earlier in the day.  Decision was made to intubate the patient.  Son was verbally informed and consented.  Patient was informed.  Life-threatening ARDS is the current working diagnosis.  Prior to intubation patient was on Cardizem this was stopped.  She is noted to be on Eliquis for atrial fibrillation.  For now this will be continued.  She is on chronic steroids this will be converted to hydrocortisone stress dose  Post intubation she has had hypotension which we have tied it over with bicarbonate boluses and also Neo-Synephrine stick boluses.  We will give her a fluid bolus and started a new infusion.  We will place an A-line and get arterial blood gas.  With ARDS we will start her on a dyspnea protocol with continuous fentanyl and Versed and as needed paralytic.  Low threshold to start continuous infusion with Nimbex.  She has only 1 peripheral IV.  She will need central access.  PICC line team -has been scheduled for 7 PM.  In the absence of which night team of critical care to place central line.   Additional 60 minutes critical care time excluding procedure time      SIGNATURE    Dr. Brand Males, M.D., F.C.C.P,  Pulmonary and Critical Care Medicine Staff Physician, [facility] Director - Interstitial Lung Disease  Program  Pulmonary [place] [facility] [place], [place], [address]  Pager: [phone], If no answer  OR between  19:00-7:00h: page [phone] Telephone (clinical office): [phone] Telephone (research): [phone]  7:27 PM 04/20/2021

## 2021-04-20 NOTE — Plan of Care (Signed)
  Problem: Education: Goal: Knowledge of General Education information will improve Description Including pain rating scale, medication(s)/side effects and non-pharmacologic comfort measures Outcome: Progressing   Problem: Health Behavior/Discharge Planning: Goal: Ability to manage health-related needs will improve Outcome: Progressing   

## 2021-04-20 NOTE — Procedures (Signed)
Intubation Procedure Note  HANA TRIPPETT  [id number]  [birth year]  Date:04/20/21  Time:7:21 PM   Provider Performing:Jazia Faraci    Procedure: Intubation ([address])  Indication(s) Respiratory Failure  Consent Risks of the procedure as well as the alternatives and risks of each were explained to the patient and/or caregiver.  Verbal Consent for the procedure was obtained from son at bedside and informed atpeint. Life threatening resp failure  Anesthesia Etomidate, Versed, Fentanyl, and Rocuronium   Time Out Verified patient identification, verified procedure, site/side was marked, verified correct patient position, special equipment/implants available, medications/allergies/relevant history reviewed, required imaging and test results available.   Sterile Technique Usual hand hygeine, masks, and gloves were used   Procedure Description Patient positioned in bed supine.  Sedation given as noted above.  Patient was intubated with endotracheal tube using Glidescope.  View was Grade 1 full glottis .  Number of attempts was 1.  Colorimetric CO2 detector was consistent with tracheal placement.   Complications/Tolerance None; patient tolerated the procedure well. Chest X-ray is ordered to verify placement.   EBL NOne   Specimen(s) None

## 2021-04-20 NOTE — Progress Notes (Addendum)
PCCM Interval Progress Note  Evaluated patient at bedside on PM rounds on arrival back from CT.  Assessed for signs of respiratory distress with the following scale:  Variable  0 Points 1 Point 2 Points Total  Heart rate per minute  <90 beats 90-109 beats >110 beats 1  Respiratory  Rate per minute < 18 breaths 19-30 breaths  >30 breaths 2  Restlessness; nonpurposeful movements None  occas slight movement Frequent movement 0  Paradoxical breathing pattern: None  Present 1  Accessory muscle use: rise in clavicle during inspiration None Slight rise Pronounced rise 0  Grunting at end-expiration: guttural sound None  Present 0  Nasal flaring: involuntary movement of nares None  Present 0  Look of fear None  Eyes wide 0  Overall total out of 16    4   Respiratory Distress Observation Scale Journal of Palliative Medicine Vol. 13, Number 3, 2010 Campbell et al.  Patient scored 4/16. At this time, no immediate indication for intubation, but patient does appear noticeably more fatigued than when seen earlier in the afternoon.  Remains high risk for intubation. Discussed this briefly with patient/son at bedside, she would ultimately like to avoid intubation and continue BiPAP if possible, but would be ok with intubation if necessary. Will notify E-Link to monitor patient respiratory status closely.  Lestine Mount, PA-C [facility] 04/20/21 5:50 PM  Please see [email or web address] for pager details.  From 7A-7P if no response, please call [phone] After hours, please call Warren [facility]acy [phone]  6:56 PM Update: Patient seen at this time by ICU Attending Ramaswamy and myself; worsening tachypnea to 50s in the setting of Chest CT with ARDS-like appearance of opacities.  Decision made to intubate patient. Explained need for procedure to patient/son at bedside.  Please see intubation procedure note for details.  Lestine Mount, PA-C [facility] 04/20/21 6:58 PM  Please see [email or web address] for pager details.  From 7A-7P if no response, please call [phone] After hours, please call ELink [phone]

## 2021-04-20 NOTE — Progress Notes (Signed)
PROGRESS NOTE    Leslie Bryant  MOQ:[id number] DOB: 1941/07/29 DOA: 05/02/2021 PCP: Biagio Borg, MD   Brief Narrative:  The patient is an 80 year old obese female with a past medical history significant for but not limited to atrial fibrillation, hypertension, giant cell arteritis on a prednisone taper who presented to the ED with generalized weakness, shortness of breath and subjective fever.  She went to her PCP for the evaluation of recurrent falls last several days.  She is noted to be short of breath orthopneic and dyspneic on exertion.  She had some mild worsening bilateral lower extremity anemia.  She has had a large weight gain over last few months due to her prednisone due to her giant cell arteritis.  In the ED she is noted to be hypoxic on room air and chest x-ray revealed bilateral lobe infiltrates.  She had an elevated BNP however troponin level was normal.  WBC was 15.7 and she is found to be in A. fib with RVR.  She is admitted in the setting of acute hypoxic respiratory failure from commune acquired pneumonia with concomitant CHF and possible amiodarone toxicity.  Throughout the day her oxygen requirement worsened and she was placed on BiPAP and then subsequently had to be intubated and is now transitioned on 2 pressors and to the critical care service.  TRH will sign off and pick up the patient once she is medically stable to be transferred to our service.   Assessment & Plan:   Principal Problem:   Acute hypoxemic respiratory failure (HCC) Active Problems:   Essential hypertension   Hyponatremia   Diabetes ([place])   Temporal arteritis (HCC)   Atrial fibrillation, rapid ([place])   CAP (community acquired pneumonia)  Acute respiratory failure in the setting of community-acquired pneumonia and concomitant CHF exacerbation with concern for amiodarone pulmonary toxicity -ABG    Component Value Date/Time   PHART 7.481 (H) 04/20/2021 1641   PCO2ART 28.1 (L) 04/20/2021 1641   PO2ART  48.7 (L) 04/20/2021 1641   HCO3 20.8 04/20/2021 1641   TCO2 26 11/02/2011 2010   ACIDBASEDEF 1.6 04/20/2021 1641   O2SAT 86.1 04/20/2021 1641  -Patient is intubated and will be managed on the vent given her elevated respirations -Checking CT of the chest showed diffuse multi focal airspace disease with a combination of consolidation and groundglass atelectasis -Care per [facility]  Septic shock in the setting of community-acquired pneumonia -Patient is immunocompromised in setting of prednisone -COVID and flu were negative Patient started on empiric antibiotics with azithromycin and Rocephin and these were continued however will defer to pulmonary to escalate -She was on no pressors  Acute on chronic diastolic CHF -She was given some fluids which is now stopped.  Start diuresis per pulmonary recommendations -Strict I's and O's and daily weights -Likely worsened in the setting of atrial fibrillation RVR  Proximal atrial fibrillation with RVR -In the setting of infection  -Hold amiodarone currently for possible amiodarone toxicity -Continue anticoagulation  Hypertension -Now holding medications given her hypotension  Diabetes mellitus type 2 Continue with sliding scale insulin   DVT prophylaxis: Anticoagulated with Apixaban  Code Status: FULL CODE  Family Communication: No family present at bedside  Disposition Plan: She will be transferred to [facility] service given her septic shock and intubation.  Status is: Inpatient  Remains inpatient appropriate because:Hemodynamically unstable, Unsafe d/c plan, IV treatments appropriate due to intensity of illness or inability to take PO, and Inpatient level of care appropriate due to severity  of illness  Dispo: The patient is from: Home              Anticipated d/c is to:  TBD              Patient currently is not medically stable to d/c.   Difficult to place patient No  Consultants:  PCCM  Procedures:  ECHOCARDIOGRAM IMPRESSIONS      1. Left ventricular ejection fraction, by estimation, is 55 to 60%. The  left ventricle has normal function. The left ventricle has no regional  wall motion abnormalities. There is mild left ventricular hypertrophy.  Left ventricular diastolic function  could not be evaluated.   2. Right ventricular systolic function is moderately reduced. The right  ventricular size is moderately enlarged. There is normal pulmonary artery  systolic pressure.   3. Left atrial size was severely dilated.   4. The mitral valve is abnormal. Mild to moderate mitral valve  regurgitation. Moderate mitral annular calcification.   5. The aortic valve is tricuspid. Aortic valve regurgitation is trivial.  No aortic stenosis is present.   FINDINGS   Left Ventricle: Left ventricular ejection fraction, by estimation, is 55  to 60%. The left ventricle has normal function. The left ventricle has no  regional wall motion abnormalities. Definity contrast agent was given IV  to delineate the left ventricular   endocardial borders. The left ventricular internal cavity size was normal  in size. There is mild left ventricular hypertrophy. Left ventricular  diastolic function could not be evaluated due to atrial fibrillation. Left  ventricular diastolic function  could not be evaluated.   Right Ventricle: The right ventricular size is moderately enlarged. Right  vetricular wall thickness was not well visualized. Right ventricular  systolic function is moderately reduced. There is normal pulmonary artery  systolic pressure. The tricuspid  regurgitant velocity is 2.44 m/s, and with an assumed right atrial  pressure of 3 mmHg, the estimated right ventricular systolic pressure is  32.3 mmHg.   Left Atrium: Left atrial size was severely dilated.   Right Atrium: Right atrial size was normal in size.   Pericardium: There is no evidence of pericardial effusion.   Mitral Valve: The mitral valve is abnormal. There is mild  thickening of  the mitral valve leaflet(s). There is mild calcification of the mitral  valve leaflet(s). Moderate mitral annular calcification. Mild to moderate  mitral valve regurgitation.   Tricuspid Valve: The tricuspid valve is grossly normal. Tricuspid valve  regurgitation is mild.   Aortic Valve: The aortic valve is tricuspid. There is mild aortic valve  annular calcification. Aortic valve regurgitation is trivial. No aortic  stenosis is present.   Pulmonic Valve: The pulmonic valve was normal in structure. Pulmonic valve  regurgitation is trivial.   Aorta: The aortic root and ascending aorta are structurally normal, with  no evidence of dilitation.   IAS/Shunts: The atrial septum is grossly normal.      LEFT VENTRICLE  PLAX 2D  LVIDd:         4.20 cm  LVIDs:         2.80 cm  LV PW:         1.40 cm  LV IVS:        1.10 cm  LVOT diam:     1.50 cm  LV SV:         22  LV SV Index:   13  LVOT Area:     1.77 cm  RIGHT VENTRICLE          IVC  RV Basal diam:  2.70 cm  IVC diam: 2.00 cm  TAPSE (M-mode): 1.3 cm   LEFT ATRIUM              Index       RIGHT ATRIUM           Index  LA diam:        4.10 cm  2.33 cm/m  RA Area:     16.80 cm  LA Vol (A2C):   113.0 ml 64.20 ml/m RA Volume:   44.80 ml  25.45 ml/m  LA Vol (A4C):   79.5 ml  45.17 ml/m  LA Biplane Vol: 94.8 ml  53.86 ml/m   AORTIC VALVE  LVOT Vmax:   74.05 cm/s  LVOT Vmean:  49.050 cm/s  LVOT VTI:    0.126 m     AORTA  Ao Root diam: 3.30 cm  Ao Asc diam:  3.40 cm   MITRAL VALVE                TRICUSPID VALVE  MV Area (PHT): 4.06 cm     TR Peak grad:   23.8 mmHg  MV Decel Time: 187 msec     TR Vmax:        244.00 cm/s  MV E velocity: 165.50 cm/s  MV A velocity: 71.70 cm/s   SHUNTS  MV E/A ratio:  2.31         Systemic VTI:  0.13 m                              Systemic Diam: 1.50 cm   Antimicrobials:  Anti-infectives (From admission, onward)    Start     Dose/Rate Route Frequency Ordered Stop    04/20/21 1000  azithromycin (ZITHROMAX) 500 mg in sodium chloride 0.9 % 250 mL IVPB        500 mg 250 mL/hr over 60 Minutes Intravenous Every 24 hours 04/29/2021 1952     04/20/21 1000  cefTRIAXone (ROCEPHIN) 2 g in sodium chloride 0.9 % 100 mL IVPB        2 g 200 mL/hr over 30 Minutes Intravenous Every 24 hours 04/06/2021 1952     04/09/2021 1645  cefTRIAXone (ROCEPHIN) 2 g in sodium chloride 0.9 % 100 mL IVPB  Status:  Discontinued        2 g 200 mL/hr over 30 Minutes Intravenous Every 24 hours 04/23/2021 1632 04/07/2021 1952   04/21/2021 1645  azithromycin (ZITHROMAX) 500 mg in sodium chloride 0.9 % 250 mL IVPB  Status:  Discontinued        500 mg 250 mL/hr over 60 Minutes Intravenous Every 24 hours 04/08/2021 1632 04/28/2021 1952        Subjective: Seen and examined at bedside and was feeling dyspneic.  Denies any shortness of breath.  Was not feeling as well.  No nausea or vomiting.  No other concerns or complaints at this time.  Objective: Vitals:   04/20/21 2000 04/20/21 2015 04/20/21 2030 04/20/21 2045  BP: (!) 171/66 (!) 166/72 (!) 182/84 (!) 178/78  Pulse: (!) 150 (!) 106 (!) 150 (!) 160  Resp: (!) 35 (!) 35 (!) 35 (!) 31  Temp:      TempSrc:      SpO2: 94% 90% (!) 89% 90%  Weight:      Height:  Intake/Output Summary (Last 24 hours) at 04/20/2021 2055 Last data filed at 04/20/2021 1600 Gross per 24 hour  Intake 351.38 ml  Output 755 ml  Net -403.62 ml   Filed Weights   04/28/2021 1451 04/04/2021 2030 04/20/21 0600  Weight: 77.1 kg 75.7 kg 74.7 kg   Examination: Physical Exam:  Constitutional: WN/WD Caucasian female currently in, moderate respiratory distress this morning with increasing oxygen requirements Eyes: Has bilateral eye ecchymosis and bruising from falls consistent with raccoon eyes ENMT: External Ears, Nose appear normal. Grossly normal hearing. n.  Neck: Appears normal, supple, no cervical masses, normal ROM, no appreciable thyromegaly: Mild JVD Respiratory:  Diminished to auscultation bilaterally with coarse breath sounds and some crackles noted.  Has increased respiratory effort and is a little tachypneic and wearing supplemental oxygen via nasal cannula this morning. Cardiovascular: Irregularly irregular and tachycardic, no murmurs / rubs / gallops. S1 and S2 auscultated.  1+ lower extremity Abdomen: Soft, non-tender, distended secondary to body habitus. Bowel sounds positive.  GU: Deferred. Musculoskeletal: No clubbing / cyanosis of digits/nails. No joint deformity upper and lower extremities.  Skin: Has bruising and ecchymosis from her falls no induration; Warm and dry.  Neurologic: CN 2-12 grossly intact with no focal deficits. Romberg sign and cerebellar reflexes not assessed.  Psychiatric: Normal judgment and insight. Alert and oriented x 3.  Slightly anxious mood and appropriate affect.   Data Reviewed: I have personally reviewed following labs and imaging studies  CBC: Recent Labs  Lab 04/16/21 0538 04/21/2021 1527 04/20/21 0248  WBC 9.1 15.7* 10.6*  NEUTROABS  --  14.8*  --   HGB 11.8* 10.9* 15.2*  HCT 33.1* 31.1* 43.3  MCV 91.7 92.3 91.2  PLT 154 181 932*   Basic Metabolic Panel: Recent Labs  Lab 04/16/21 0538 04/22/2021 1527 04/20/21 0248  NA 128* 123* 131*  K 3.7 3.5 3.3*  CL 88* 86* 95*  CO2 28 25 24   GLUCOSE 123* 440* 161*  BUN 21 31* 23  CREATININE 0.90 1.38* 1.16*  CALCIUM 8.9 8.6* 8.5*   GFR: Estimated Creatinine Clearance: 36.6 mL/min (A) (by C-G formula based on SCr of 1.16 mg/dL (H)). Liver Function Tests: Recent Labs  Lab 04/16/21 0538 04/20/2021 1527 04/20/21 0248  AST 32 40 31  ALT 73* 55* 48*  ALKPHOS 46 84 63  BILITOT 0.5 0.5 0.7  PROT 6.6 6.6 6.4*  ALBUMIN 3.1* 2.3* 2.3*   Recent Labs  Lab 04/28/2021 1527  LIPASE 27   No results for input(s): AMMONIA in the last 168 hours. Coagulation Profile: Recent Labs  Lab 04/16/21 0538 04/07/2021 1527  INR 1.2 2.2*   Cardiac Enzymes: No results for  input(s): CKTOTAL, CKMB, CKMBINDEX, TROPONINI in the last 168 hours. BNP (last 3 results) Recent Labs    07/21/20 1215  PROBNP 1,519*   HbA1C: No results for input(s): HGBA1C in the last 72 hours. CBG: Recent Labs  Lab 04/06/2021 1846 04/24/2021 2211 04/20/21 0743 04/20/21 1217 04/20/21 1632  GLUCAP 319* 264* 178* 289* 221*   Lipid Profile: No results for input(s): CHOL, HDL, LDLCALC, TRIG, CHOLHDL, LDLDIRECT in the last 72 hours. Thyroid Function Tests: Recent Labs    04/04/2021 2119 04/13/2021 2126  TSH 0.115*  --   FREET4  --  2.27*   Anemia Panel: No results for input(s): VITAMINB12, FOLATE, FERRITIN, TIBC, IRON, RETICCTPCT in the last 72 hours. Sepsis Labs: Recent Labs  Lab 05/03/2021 1527 04/13/2021 1744  LATICACIDVEN 2.5* 2.0*    Recent Results (from  the past 240 hour(s))  Resp Panel by RT-PCR (Flu A&B, Covid) Nasopharyngeal Swab     Status: None   Collection Time: 04/06/2021  3:27 PM   Specimen: Nasopharyngeal Swab; Nasopharyngeal(NP) swabs in vial transport medium  Result Value Ref Range Status   SARS Coronavirus 2 by RT PCR NEGATIVE NEGATIVE Final    Comment: (NOTE) SARS-CoV-2 target nucleic acids are NOT DETECTED.  The SARS-CoV-2 RNA is generally detectable in upper respiratory specimens during the acute phase of infection. The lowest concentration of SARS-CoV-2 viral copies this assay can detect is 138 copies/mL. A negative result does not preclude SARS-Cov-2 infection and should not be used as the sole basis for treatment or other patient management decisions. A negative result may occur with  improper specimen collection/handling, submission of specimen other than nasopharyngeal swab, presence of viral mutation(s) within the areas targeted by this assay, and inadequate number of viral copies(<138 copies/mL). A negative result must be combined with clinical observations, patient history, and epidemiological information. The expected result is  Negative.  Fact Sheet for Patients:  EntrepreneurPulse.com.au  Fact Sheet for Healthcare Providers:  IncredibleEmployment.be  This test is no t yet approved or cleared by the [place] FDA and  has been authorized for detection and/or diagnosis of SARS-CoV-2 by FDA under an Emergency Use Authorization (EUA). This EUA will remain  in effect (meaning this test can be used) for the duration of the COVID-19 declaration under Section 564(b)(1) of the Act, 21 U.S.C.section 360bbb-3(b)(1), unless the authorization is terminated  or revoked sooner.       Influenza A by PCR NEGATIVE NEGATIVE Final   Influenza B by PCR NEGATIVE NEGATIVE Final    Comment: (NOTE) The Xpert Xpress SARS-CoV-2/FLU/RSV plus assay is intended as an aid in the diagnosis of influenza from Nasopharyngeal swab specimens and should not be used as a sole basis for treatment. Nasal washings and aspirates are unacceptable for Xpert Xpress SARS-CoV-2/FLU/RSV testing.  Fact Sheet for Patients: EntrepreneurPulse.com.au  Fact Sheet for Healthcare Providers: IncredibleEmployment.be  This test is not yet approved or cleared by the [place] FDA and has been authorized for detection and/or diagnosis of SARS-CoV-2 by FDA under an Emergency Use Authorization (EUA). This EUA will remain in effect (meaning this test can be used) for the duration of the COVID-19 declaration under Section 564(b)(1) of the Act, 21 U.S.C. section 360bbb-3(b)(1), unless the authorization is terminated or revoked.  Performed at [facility], [address] [place], [place] [address]   MRSA PCR Screening     Status: None   Collection Time: 04/06/2021 10:44 PM  Result Value Ref Range Status   MRSA by PCR NEGATIVE NEGATIVE Final    Comment:        The GeneXpert MRSA Assay (FDA approved for NASAL specimens only), is one component of a comprehensive MRSA  colonization surveillance program. It is not intended to diagnose MRSA infection nor to guide or monitor treatment for MRSA infections. Performed at [facility], [place] [address] [place], [place] [address]     RN Pressure Injury Documentation:     Estimated body mass index is 30.12 kg/m as calculated from the following:   Height as of this encounter: 5\' 2"  (1.575 m).   Weight as of this encounter: 74.7 kg.  Malnutrition Type:   Malnutrition Characteristics:   Nutrition Interventions:     Radiology Studies: CT CHEST WO CONTRAST  Result Date: 04/20/2021 CLINICAL DATA:  Acute hypoxemic respiratory failure  EXAM: CT CHEST WITHOUT CONTRAST TECHNIQUE: Multidetector CT imaging of the chest was performed following the standard protocol without IV contrast. COMPARISON:  09/24/2017, 04/20/2021 FINDINGS: Cardiovascular: Unenhanced imaging of the heart and great vessels demonstrates no pericardial effusion. Dense calcification of the mitral annulus. Normal caliber of the thoracic aorta. Evaluation of the lumen is limited without IV contrast. Mild atherosclerosis of the descending thoracic aorta. Mediastinum/Nodes: Multiple borderline enlarged lymph nodes are seen within the mediastinum, largest in the subcarinal region measuring up to 9 mm in short axis. These may be reactive. Thyroid, trachea, and esophagus are grossly unremarkable. Lungs/Pleura: There is multifocal bilateral airspace disease, greatest in the right lower lobe. There is a mixture of dense consolidation within the lung bases, with more ground-glass attenuation within the upper lung zones. Findings may reflect a combination of edema and infection. There is trace right pleural fluid. No pneumothorax. Central airways are patent. Mild diffuse bronchiectasis. Upper Abdomen: No acute abnormality. Musculoskeletal: No acute or destructive bony lesions. Postsurgical changes are seen from right mastectomy with right breast  prosthesis identified. Reconstructed images demonstrate no additional findings. IMPRESSION: 1. Multifocal bilateral airspace disease, with a mixture of dense consolidation and ground-glass opacities. The CT appearance is most characteristic of multifocal pneumonia given the areas of dense consolidation. 2. Subcentimeter mediastinal and hilar lymph nodes, likely reactive. No pathologic adenopathy. 3. Trace right pleural effusion. 4.  Aortic Atherosclerosis (ICD10-I70.0). Electronically Signed   By: Randa Ngo M.D.   On: 04/20/2021 19:43   Portable Chest x-ray  Result Date: 04/20/2021 CLINICAL DATA:  Endotracheal tube placement single frontal view of the chest demonstrates endotracheal tube EXAM: PORTABLE CHEST 1 VIEW COMPARISON:  04/20/2021 FINDINGS: Overlying tracheal air column, tip approximately 2 cm above carina. Progressive bilateral perihilar airspace disease, right greater than left. No large effusion or pneumothorax. No acute bony abnormality. IMPRESSION: 1. No complications after intubation. 2. Progressive multifocal bilateral airspace disease, favor edema over infection given rapid progression. Electronically Signed   By: Randa Ngo M.D.   On: 04/20/2021 19:37   DG CHEST PORT 1 VIEW  Result Date: 04/20/2021 CLINICAL DATA:  Respiratory distress. EXAM: PORTABLE CHEST 1 VIEW COMPARISON:  April 19, 2021. FINDINGS: Mildly increased right greater than left midlung and basilar patchy airspace opacities. Superimposed mildly prominent interstitial markings, likely chronic. No visible pleural effusions or pneumothorax. Similar cardiomediastinal silhouette. Right axillary clips. No evidence of acute osseous abnormality. IMPRESSION: Mildly increased right greater than left midlung and basilar patchy airspace opacities, concerning for multifocal pneumonia. Electronically Signed   By: Margaretha Sheffield MD   On: 04/20/2021 10:32   DG Chest Portable 1 View  Result Date: 04/21/2021 CLINICAL DATA:   Shortness of breath.  Weakness.  Now with cough. EXAM: PORTABLE CHEST 1 VIEW COMPARISON:  02/02/2019 FINDINGS: Stable cardiomediastinal contours. Diffuse coarsened interstitial markings are identified bilaterally. New bilateral lower lung zone airspace opacities are noted, right greater than left. The visualized osseous structures appear intact. Right axillary surgical clips and breast prosthesis noted. IMPRESSION: 1. New bilateral lower lung zone airspace opacities, right greater than left. Imaging findings concerning for multifocal pneumonia. 2. Chronic interstitial lung disease. Electronically Signed   By: Kerby Moors M.D.   On: 04/08/2021 16:11   ECHOCARDIOGRAM COMPLETE  Result Date: 04/20/2021    ECHOCARDIOGRAM REPORT   Patient Name:   DEVANI ODONNEL Date of Exam: 04/20/2021 Medical Rec #:  [id number]     Height:       62.0 in Accession #:  [id number]    Weight:       164.7 lb Date of Birth:  September 23, 1941      BSA:          1.760 m Patient Age:    2 years      BP:           121/70 mmHg Patient Gender: F             HR:           80 bpm. Exam Location:  Inpatient Procedure: 2D Echo, Cardiac Doppler, Color Doppler and Intracardiac            Opacification Agent Indications:    O29.47 Chronic systolic (congestive) heart failure; I48.0                 Paroxysmal atrial fibrillation  History:        Patient has prior history of Echocardiogram examinations, most                 recent 02/07/2020. Risk Factors:Dyslipidemia. GERD. Palpitations.  Sonographer:    Jonelle Sidle Dance Referring Phys: [place]  1. Left ventricular ejection fraction, by estimation, is 55 to 60%. The left ventricle has normal function. The left ventricle has no regional wall motion abnormalities. There is mild left ventricular hypertrophy. Left ventricular diastolic function could not be evaluated.  2. Right ventricular systolic function is moderately reduced. The right ventricular size is moderately enlarged. There is  normal pulmonary artery systolic pressure.  3. Left atrial size was severely dilated.  4. The mitral valve is abnormal. Mild to moderate mitral valve regurgitation. Moderate mitral annular calcification.  5. The aortic valve is tricuspid. Aortic valve regurgitation is trivial. No aortic stenosis is present. FINDINGS  Left Ventricle: Left ventricular ejection fraction, by estimation, is 55 to 60%. The left ventricle has normal function. The left ventricle has no regional wall motion abnormalities. Definity contrast agent was given IV to delineate the left ventricular  endocardial borders. The left ventricular internal cavity size was normal in size. There is mild left ventricular hypertrophy. Left ventricular diastolic function could not be evaluated due to atrial fibrillation. Left ventricular diastolic function could not be evaluated. Right Ventricle: The right ventricular size is moderately enlarged. Right vetricular wall thickness was not well visualized. Right ventricular systolic function is moderately reduced. There is normal pulmonary artery systolic pressure. The tricuspid regurgitant velocity is 2.44 m/s, and with an assumed right atrial pressure of 3 mmHg, the estimated right ventricular systolic pressure is 65.4 mmHg. Left Atrium: Left atrial size was severely dilated. Right Atrium: Right atrial size was normal in size. Pericardium: There is no evidence of pericardial effusion. Mitral Valve: The mitral valve is abnormal. There is mild thickening of the mitral valve leaflet(s). There is mild calcification of the mitral valve leaflet(s). Moderate mitral annular calcification. Mild to moderate mitral valve regurgitation. Tricuspid Valve: The tricuspid valve is grossly normal. Tricuspid valve regurgitation is mild. Aortic Valve: The aortic valve is tricuspid. There is mild aortic valve annular calcification. Aortic valve regurgitation is trivial. No aortic stenosis is present. Pulmonic Valve: The pulmonic  valve was normal in structure. Pulmonic valve regurgitation is trivial. Aorta: The aortic root and ascending aorta are structurally normal, with no evidence of dilitation. IAS/Shunts: The atrial septum is grossly normal.  LEFT VENTRICLE PLAX 2D LVIDd:         4.20 cm LVIDs:         2.80  cm LV PW:         1.40 cm LV IVS:        1.10 cm LVOT diam:     1.50 cm LV SV:         22 LV SV Index:   13 LVOT Area:     1.77 cm  RIGHT VENTRICLE          IVC RV Basal diam:  2.70 cm  IVC diam: 2.00 cm TAPSE (M-mode): 1.3 cm LEFT ATRIUM              Index       RIGHT ATRIUM           Index LA diam:        4.10 cm  2.33 cm/m  RA Area:     16.80 cm LA Vol (A2C):   113.0 ml 64.20 ml/m RA Volume:   44.80 ml  25.45 ml/m LA Vol (A4C):   79.5 ml  45.17 ml/m LA Biplane Vol: 94.8 ml  53.86 ml/m  AORTIC VALVE LVOT Vmax:   74.05 cm/s LVOT Vmean:  49.050 cm/s LVOT VTI:    0.126 m  AORTA Ao Root diam: 3.30 cm Ao Asc diam:  3.40 cm MITRAL VALVE                TRICUSPID VALVE MV Area (PHT): 4.06 cm     TR Peak grad:   23.8 mmHg MV Decel Time: 187 msec     TR Vmax:        244.00 cm/s MV E velocity: 165.50 cm/s MV A velocity: 71.70 cm/s   SHUNTS MV E/A ratio:  2.31         Systemic VTI:  0.13 m                             Systemic Diam: 1.50 cm Mertie Moores MD Electronically signed by Mertie Moores MD Signature Date/Time: 04/20/2021/3:38:06 PM    Final    [place] EKG SITE RITE  Result Date: 04/20/2021 If Site Rite image not attached, placement could not be confirmed due to current cardiac rhythm.   Scheduled Meds:  apixaban  5 mg Oral BID   Chlorhexidine Gluconate Cloth  6 each Topical Daily   docusate  100 mg Per Tube BID   fentaNYL (SUBLIMAZE) injection  25 mcg Intravenous Once   hydrocortisone sod succinate (SOLU-CORTEF) inj  50 mg Intravenous Q8H   insulin aspart  0-15 Units Subcutaneous TID WC   insulin aspart  0-5 Units Subcutaneous QHS   ipratropium  0.5 mg Nebulization Q6H   levalbuterol  0.63 mg Nebulization Q6H   mouth  rinse  15 mL Mouth Rinse BID   midazolam       pantoprazole  80 mg Oral Daily   polyethylene glycol  17 g Per Tube Daily   Continuous Infusions:  sodium chloride     sodium chloride     azithromycin Stopped (04/20/21 1137)   cefTRIAXone (ROCEPHIN)  IV Stopped (04/20/21 1310)   fentaNYL infusion INTRAVENOUS 25 mcg/hr (04/20/21 1952)   midazolam 1 mg/hr (04/20/21 1954)   phenylephrine (NEO-SYNEPHRINE) Adult infusion     phenylephrine     LOS: 1 day   Kerney Elbe, DO [facility] PAGER is on AMION  If 7PM-7AM, please contact night-coverage [email or web address]

## 2021-04-20 NOTE — Progress Notes (Signed)
EET tube pulled back 2 cm per CCM order. Patient tolerated well

## 2021-04-20 NOTE — Consult Note (Addendum)
NAME:  Leslie Bryant MRN:  [id number] DOB:  03-08-1941 LOS: 1 ADMISSION DATE:  04/18/2021 CONSULTATION DATE: 04/20/2021 REFERRING MD:  Alfredia Ferguson - [facility] CHIEF COMPLAINT:  SOB, hypoxic respiratory failure   History of Present Illness:  80 year old female with PMHx significant for HTN, Afib (on Eliquis, s/p cardioversion x 2) and GCA (managed with prednisone) who presented to [facility] 6/16 via EMS for worsening SOB, hypoxia and concern for PNA.  Per chart review/discussion with patient's husband bedside, patient recently had a fall while she was gardening (bent forward too far and fell headfirst onto a rock). She sustained some bruising to her face but was otherwise unharmed; no LOC or dizziness at the time of fall. Subsequently, patient had another fall 6/13 after getting out of bed (per husband, legs "buckled", no mechanical etiology of fall and no dizziness/LOC). EMS was called to help get patient up and she was brought to [facility] ED for evaluation. CT Head/CT C-spine were negative and labs were notable for hyponatremia; UA was negative and hypotension on presentation resolved with fluid resuscitation. She was discharged home from ED.  On 6/16, patient presented to PCP [facility]) for evaluation of weakness with recent falls/post-hospital f/u. At that time reported SOB, weakness, and slight productive cough. Has had some weight gain recently in the setting of prednisone for GCA, some increased LE edema. O2 sat at PCP's office was 89% on RA at rest, dropping to 87% with exertion. Subsequently directed to ED for further evaluation and management. On ED evaluation, O2 saturation was 85% on RA and patient was in Afib with RVR (rates 130s-150s). CXR demonsrated bilateral lower lobe infiltrates. Labs were notable for Na 123, BNP 467, Trop 20 and WBC 15K. Patient was admitted to [facility].  PCCM consulted 6/17 for ongoing hypoxemic respiratory failure despite supplemental O2.  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  Resolved by CT scan Recurrent episode clinical dx 07/2012    Diverticulosis of colon    sigmoid   GERD (gastroesophageal reflux disease)    H/O hiatal hernia    Headache    History of kidney stones    "passed it" (02/19/2017)   Hypercholesterolemia    history   Left knee pain    occasionally   Palpitations IRREGULAR HEARTBEAT--  CONTROLLED W/  BETA BLOCKER   Pneumonia 2017   "walking pneumonia" (02/19/2017)   Swelling of left knee joint    Significant Hospital Events: Including procedures, antibiotic start and stop dates in addition to other pertinent events   6/16 - Admitted to [facility] for SOB, hypoxia, multifocal PNA 6/17 - PCCM consulted for persistent hypoxia, mild lethargy, ICU admission  Interim History / Subjective:  Feeling "much better" Drowsy but wakes easily to voice Breathing feels good overall Less SOB, no CP, no fever/chills, HA, n/v Denies any pain at present Husband at bedside  Objective:  Blood pressure 137/65, pulse (!) 102, temperature 98.7 F (37.1 C), temperature source Oral, resp. rate (!) 39, height 5\' 2"  (1.575 m), weight 74.7 kg, SpO2 90 %.  Intake/Output Summary (Last 24 hours) at 04/20/2021 1040 Last data filed at 04/20/2021 0602 Gross per 24 hour  Intake 968.05 ml  Output 230 ml  Net 738.05 ml   Filed Weights   04/10/2021 1451 04/10/2021 2030 04/20/21 0600  Weight: 77.1 kg 75.7 kg 74.7 kg   Physical Examination: General: Acutely ill-appearing elderly woman in NAD. HEENT: Anicteric sclera, PERRL, moist mucous membranes. Bilateral periorbital ecchymosis 2/2 recent  fall. Neuro: Awake, oriented x 4. Responds to verbal stimuli. Following commands consistently. Moves all 4 extremities spontaneously. Strength 5/5 in all 4 extremities. CV: Irregularly irregular rhythm, rate 100s, no m/g/r. PULM: Breathing even and unlabored on 7L HFNC/[place]. Lung fields diminished bilateral, R >L with scattered rhonchi. GI: Soft, nontender, nondistended. Normoactive bowel sounds. Extremities: Trace bilateral symmetric LE edema noted. Skin: Warm/dry, no rashes. Ecchymosis around bilateral periorbital regions as above.  Labs/imaging that I have personally reviewed: (right click and "Reselect all SmartList Selections" daily)  WBC 10.6 (15.7), H&H 15.2/43.3 (suspect hemoconcentration), Plt 144 (151)  Na 131 (133), K 3.3 (3.5), CO2 24, BUN 23 (31), Cr 1.16 (1.38) Ca 8.5 (8.6), AST 31 (40), ALT 48 (55), AP 63 (87), Tbili 0.7 (0.5)  Glucoses 114-319 last 24H  ABG: 7.484 / 29.7 / 58 / 22.1  Trop: 20 (20)  TSH 0.115 T4 2.27  UA >500 glucose, trace Hgb, protein 30, nitrite +, leuk -  BCx, UCx pending  CXR 6/16 IMPRESSION: 1. New bilateral lower lung zone airspace opacities, right greater than left. Imaging findings concerning for multifocal pneumonia. 2. Chronic interstitial lung disease.   CXR 6/17 IMPRESSION: Mildly increased right greater than left midlung and basilar patchy airspace opacities, concerning for multifocal pneumonia.  Resolved Hospital Problem List:     Assessment & Plan:  Acute hypoxemic respiratory failure in the setting of multifocal PNA with likely component of pulmonary edema - Continue respiratory support with HFNC/BiPAP - BiPAP 4 hours on, 2 hours off during the day + QHS - Repeat ABG after 4 hours on BiPAP - Titrate FiO2 for O2 sat > 90% - Bronchodilators as appropriate (Xopenex, Atrovent) - Pulmonary hygiene - Continue Ceftriaxone, Azithromycin - Daily CXR - F/u CT Chest to better characterize infiltrates  Concern for CHF,  present on admission Last Echo 02/2020 with LVEF 60-65%, normal RV function. Utilizes Lasix at home. - Assess diuresis needs daily - F/u repeat Echo 6/17  Atrial fibrillation with RVR, present on admission History of Afib prior to admission, presented with Afib with RVR. Takes Eliquis at home as well as amiodarone for rate control. Per TRH note, query amiodarone lung toxicity as possible etiology of hypoxemia. - Continue diltiazem gtt - Continue metoprolol - Amiodarone held by primary team - Continue Eliquis  History of giant cell arteritis, present on admission Diagnosed with GCA, managed with prednisone. Per patient, has gained weight since prednisone initiated. - Continue prednisone  Best Practice: (right click and "Reselect all SmartList Selections" daily)  Diet:  Oral Pain/Anxiety/Delirium protocol (if indicated): No VAP protocol (if indicated): Not indicated DVT prophylaxis: Systemic AC - Eliquis GI prophylaxis: PPI Glucose control:  SSI Yes Central venous access:  N/A Arterial line:  N/A Foley:  N/A Mobility:  bed rest  PT consulted: Yes Last date of multidisciplinary goals of care discussion [Per Primary] Code Status:  full code Disposition: ICU  Labs:   CBC: Recent Labs  Lab 04/16/21 0538 04/12/2021 1527 04/20/21 0248  WBC 9.1 15.7* 10.6*  NEUTROABS  --  14.8*  --  HGB 11.8* 10.9* 15.2*  HCT 33.1* 31.1* 43.3  MCV 91.7 92.3 91.2  PLT 154 181 967*   Basic Metabolic Panel: Recent Labs  Lab 04/16/21 0538 04/23/2021 1527 04/20/21 0248  NA 128* 123* 131*  K 3.7 3.5 3.3*  CL 88* 86* 95*  CO2 28 25 24   GLUCOSE 123* 440* 161*  BUN 21 31* 23  CREATININE 0.90 1.38* 1.16*  CALCIUM 8.9 8.6* 8.5*   GFR: Estimated Creatinine Clearance: 36.6 mL/min (A) (by C-G formula based on SCr of 1.16 mg/dL (H)). Recent Labs  Lab 04/16/21 0538 04/26/2021 1527 04/26/2021 1744 04/20/21 0248  WBC 9.1 15.7*  --  10.6*  LATICACIDVEN  --  2.5* 2.0*  --    Liver Function  Tests: Recent Labs  Lab 04/16/21 0538 04/20/2021 1527 04/20/21 0248  AST 32 40 31  ALT 73* 55* 48*  ALKPHOS 46 84 63  BILITOT 0.5 0.5 0.7  PROT 6.6 6.6 6.4*  ALBUMIN 3.1* 2.3* 2.3*   Recent Labs  Lab 04/14/2021 1527  LIPASE 27   No results for input(s): AMMONIA in the last 168 hours.  ABG:    Component Value Date/Time   PHART 7.484 (H) 04/20/2021 0950   PCO2ART 29.7 (L) 04/20/2021 0950   PO2ART 58.0 (L) 04/20/2021 0950   HCO3 22.1 04/20/2021 0950   TCO2 26 11/02/2011 2010   O2SAT 90.1 04/20/2021 0950    Coagulation Profile: Recent Labs  Lab 04/16/21 0538 04/16/2021 1527  INR 1.2 2.2*   Cardiac Enzymes: No results for input(s): CKTOTAL, CKMB, CKMBINDEX, TROPONINI in the last 168 hours.  HbA1C: HbA1c, POC (prediabetic range)  Date/Time Value Ref Range Status  08/18/2018 10:21 AM 0 (A) 5.7 - 6.4 % Final   HbA1c, POC (controlled diabetic range)  Date/Time Value Ref Range Status  08/18/2018 10:21 AM 0.0 0.0 - 7.0 % Final   HbA1c POC (<> result, manual entry)  Date/Time Value Ref Range Status  08/18/2018 10:21 AM 0 4.0 - 5.6 % Final   Hgb A1c MFr Bld  Date/Time Value Ref Range Status  02/01/2021 08:55 AM 7.1 (H) 4.6 - 6.5 % Final    Comment:    Glycemic Control Guidelines for People with Diabetes:Non Diabetic:  <6%Goal of Therapy: <7%Additional Action Suggested:  >8%   05/03/2020 02:17 PM 6.8 (H) 4.6 - 6.5 % Final    Comment:    Glycemic Control Guidelines for People with Diabetes:Non Diabetic:  <6%Goal of Therapy: <7%Additional Action Suggested:  >8%    CBG: Recent Labs  Lab 05/03/2021 1846 04/29/2021 2211 04/20/21 0743  GLUCAP 319* 264* 178*   Review of Systems:   Review of systems completed with pertinent positives/negatives outlined in above HPI.  Past Medical History:  She,  has a past medical history of Acute meniscal tear of knee (LEFT KNEE), Allergic rhinitis, Allergy, Arthritis, Atrial fibrillation ([place]), Breast cancer, right breast ([place]) (1986),  Bright's disease, Diverticulitis of large intestine with perforation (11/02/2011), Diverticulosis of colon, GERD (gastroesophageal reflux disease), H/O hiatal hernia, Headache, History of kidney stones, Hypercholesterolemia, Left knee pain, Palpitations (IRREGULAR HEARTBEAT--  CONTROLLED W/  BETA BLOCKER), Pneumonia (2017), and Swelling of left knee joint.   Surgical History:   Past Surgical History:  Procedure Laterality Date   ARTERY BIOPSY Left 01/16/2021   Procedure: BIOPSY TEMPORAL ARTERY;  Surgeon: Leta Baptist, MD;  Location: [place];  Service: ENT;  Laterality: Left;   BREAST BIOPSY Right 1986   BREAST BIOPSY Left    BREAST RECONSTRUCTION  Right 1987   W/ IMPLANTS   CARDIAC CATHETERIZATION  12-22-2000  DR BRACKBILL   NORMAL LVF/ NORMAL CORONARY ARTERIES   CARDIOVERSION N/A 03/23/2020   Procedure: CARDIOVERSION;  Surgeon: Donato Heinz, MD;  Location: [facility] ENDOSCOPY;  Service: Cardiovascular;  Laterality: N/A;   CARDIOVERSION N/A 07/20/2020   Procedure: CARDIOVERSION;  Surgeon: Buford Dresser, MD;  Location: [place];  Service: Cardiovascular;  Laterality: N/A;   [place]   CHONDROPLASTY  09/08/2012   Procedure: CHONDROPLASTY;  Surgeon: Tobi Bastos, MD;  Location: [facility];  Service: Orthopedics;  Laterality: Left;   COLONOSCOPY     last colon 05-03-2009 (02/19/2017)   COLONOSCOPY W/ BIOPSIES AND POLYPECTOMY     KNEE ARTHROSCOPY WITH MEDIAL MENISECTOMY  09/08/2012   Procedure: KNEE ARTHROSCOPY WITH MEDIAL MENISECTOMY;  Surgeon: Tobi Bastos, MD;  Location: [place];  Service: Orthopedics;  Laterality: Left;  lateral tibial plateau,    MASTECTOMY Right 1986   W/ MULTIPLE NODE DISSECTION   REDUCTION MAMMAPLASTY Left 1987   TOTAL KNEE ARTHROPLASTY Left 07/21/2013   Procedure: TOTAL KNEE ARTHROPLASTY;  Surgeon: Newt Minion, MD;  Location: [place];  Service: Orthopedics;  Laterality: Left;  Left  Total Knee Arthroplasty   TOTAL KNEE ARTHROPLASTY Right 02/19/2017   Procedure: RIGHT TOTAL KNEE ARTHROPLASTY;  Surgeon: Newt Minion, MD;  Location: [place];  Service: Orthopedics;  Laterality: Right;   VAGINAL HYSTERECTOMY  1966    Social History:   reports that she has never smoked. She has never used smokeless tobacco. She reports previous alcohol use of about 1.0 standard drink of alcohol per week. She reports that she does not use drugs.   Family History:  Her family history includes Brain cancer in her sister; Cancer in her brother, sister, and sister; Colon cancer in her brother; Dementia in her maternal grandfather and sister; Esophageal cancer in her brother; Heart attack in her father; Heart disease in her daughter; Heart failure in her mother; Leukemia in her brother; Lung cancer in her sister; Rectal cancer in her brother. There is no history of Stomach cancer.   Allergies Allergies  Allergen Reactions   Crestor [Rosuvastatin Calcium] Other (See Comments)    Shoulder pain   Crestor [Rosuvastatin]     Other reaction(s): Unknown   Lipitor [Atorvastatin Calcium] Other (See Comments)    Myalgias    Pravachol [Pravastatin] Other (See Comments)    myalgias   Penicillins Itching    Has patient had a PCN reaction causing immediate rash, facial/tongue/throat swelling, SOB or lightheadedness with hypotension: No Has patient had a PCN reaction causing severe rash involving mucus membranes or skin necrosis: No Has patient had a PCN reaction that required hospitalization No Has patient had a PCN reaction occurring within the last 10 years: No If all of the above answers are "NO", then may proceed with Cephalosporin use.     Home Medications  Prior to Admission medications   Medication Sig Start Date End Date Taking? Authorizing Provider  alendronate (FOSAMAX) 70 MG tablet Take 70 mg by mouth once a week. 01/30/21  Yes [provider]  amiodarone (PACERONE) 200 MG tablet Take  1 tablet (200 mg total) by mouth daily. 06/19/20  Yes Deboraha Sprang, MD  calcium carbonate (OS-CAL - DOSED IN MG OF ELEMENTAL CALCIUM) 1250 (500 Ca) MG tablet Take 1 tablet by mouth daily with breakfast.   Yes [provider]  co-enzyme Q-10 30 MG capsule  Take 30 mg by mouth daily.   Yes [provider]  ELIQUIS 5 MG TABS tablet Take 1 tablet by mouth twice daily Patient taking differently: Take 5 mg by mouth 2 (two) times daily. 02/12/21  Yes Hilty, Nadean Corwin, MD  ezetimibe (ZETIA) 10 MG tablet Take 1 tablet by mouth once daily Patient taking differently: Take 10 mg by mouth daily. 02/01/21  Yes Deboraha Sprang, MD  famotidine (PEPCID) 40 MG tablet TAKE 1 TABLET BY MOUTH AT BEDTIME Patient taking differently: Take 40 mg by mouth daily. 03/22/21  Yes Biagio Borg, MD  furosemide (LASIX) 40 MG tablet Take 1 tablet by mouth once daily Patient taking differently: Take 40 mg by mouth daily. 03/05/21  Yes Hilty, Nadean Corwin, MD  metoprolol tartrate (LOPRESSOR) 50 MG tablet TAKE 1 & 1/2 (ONE & ONE-HALF) TABLETS BY MOUTH TWICE DAILY Patient taking differently: Take 75 mg by mouth 2 (two) times daily. 11/29/20  Yes Deboraha Sprang, MD  Multiple Vitamins-Minerals (CENTRUM SILVER PO) Take 1 tablet by mouth every morning.   Yes [provider]  omeprazole (PRILOSEC) 40 MG capsule TAKE 1 CAPSULE BY MOUTH TWICE DAILY BEFORE MEAL(S) Patient taking differently: Take 40 mg by mouth daily. 08/01/20  Yes Almyra Deforest, PA  predniSONE (DELTASONE) 10 MG tablet Take 10 mg by mouth 2 (two) times daily with a meal. 04/10/21  Yes [provider]  ACTEMRA 162 MG/0.9ML SOSY Inject 162 mg into the skin every 14 (fourteen) days. 04/06/21   [provider]  hyoscyamine (LEVSIN SL) 0.125 MG SL tablet DISSOLVE 1 TABLET IN MOUTH THREE TIMES DAILY AS NEEDED FOR ABDOMINAL CRAMPS 12/22/20 04/24/2021  Gatha Mayer, MD  ipratropium (ATROVENT) 0.06 % nasal spray as needed. 11/07/20 04/14/2021  [provider]  zolpidem Lorrin Mais) 5 MG tablet 1-2 tab by mouth at bedtime as needed 02/20/21 04/04/2021  Biagio Borg, MD    Critical care time: 19 minutes   Lestine Mount, PA-C [facility] 04/20/21 10:40 AM  Please see [email or web address] for pager details.  From 7A-7P if no response, please call [phone] After hours, please call E-Link [phone]

## 2021-04-20 NOTE — Progress Notes (Signed)
[facility] Progress Note Patient Name: Leslie Bryant DOB: February 03, 1941 MRN: [id number]   Date of Service  04/20/2021  HPI/Events of Note  Patient needs an  order for a Foley catheter, and Q 4 hourly CBG with SSI coverage.  eICU Interventions  Orders entered.        Kerry Kass Kynslie Ringle 04/20/2021, 10:11 PM

## 2021-04-20 NOTE — Progress Notes (Signed)
  Echocardiogram 2D Echocardiogram has been performed.  Rutha Melgoza G Samaa Ueda 04/20/2021, 1:52 PM

## 2021-04-20 NOTE — Progress Notes (Signed)
Rt gave pt flutter valve per MD order. Pt knows and understands how to use. 

## 2021-04-20 NOTE — Progress Notes (Signed)
Spoke with Thomasena Edis, RN regarding PICC order.  Aware that PICC will be placed after 7 pm today.

## 2021-04-21 ENCOUNTER — Inpatient Hospital Stay (HOSPITAL_COMMUNITY): Payer: Medicare Other

## 2021-04-21 DIAGNOSIS — J80 Acute respiratory distress syndrome: Secondary | ICD-10-CM

## 2021-04-21 DIAGNOSIS — N179 Acute kidney failure, unspecified: Secondary | ICD-10-CM

## 2021-04-21 LAB — COMPREHENSIVE METABOLIC PANEL
ALT: 43 U/L (ref 0–44)
ALT: 44 U/L (ref 0–44)
AST: 39 U/L (ref 15–41)
AST: 40 U/L (ref 15–41)
Albumin: 1.9 g/dL — ABNORMAL LOW (ref 3.5–5.0)
Albumin: 1.8 g/dL — ABNORMAL LOW (ref 3.5–5.0)
Alkaline Phosphatase: 60 U/L (ref 38–126)
Alkaline Phosphatase: 67 U/L (ref 38–126)
Anion gap: 10 (ref 5–15)
Anion gap: 12 (ref 5–15)
BUN: 32 mg/dL — ABNORMAL HIGH (ref 8–23)
BUN: 42 mg/dL — ABNORMAL HIGH (ref 8–23)
CO2: 24 mmol/L (ref 22–32)
CO2: 22 mmol/L (ref 22–32)
Calcium: 7.5 mg/dL — ABNORMAL LOW (ref 8.9–10.3)
Calcium: 7.5 mg/dL — ABNORMAL LOW (ref 8.9–10.3)
Chloride: 99 mmol/L (ref 98–111)
Chloride: 100 mmol/L (ref 98–111)
Creatinine, Ser: 1.85 mg/dL — ABNORMAL HIGH (ref 0.44–1.00)
Creatinine, Ser: 2.55 mg/dL — ABNORMAL HIGH (ref 0.44–1.00)
GFR, Estimated: 27 mL/min — ABNORMAL LOW (ref >60)
GFR, Estimated: 19 mL/min — ABNORMAL LOW (ref >60)
Glucose, Bld: 217 mg/dL — ABNORMAL HIGH (ref 70–99)
Glucose, Bld: 232 mg/dL — ABNORMAL HIGH (ref 70–99)
Potassium: 4.1 mmol/L (ref 3.5–5.1)
Potassium: 4.3 mmol/L (ref 3.5–5.1)
Sodium: 133 mmol/L — ABNORMAL LOW (ref 135–145)
Sodium: 134 mmol/L — ABNORMAL LOW (ref 135–145)
Total Bilirubin: 0.5 mg/dL (ref 0.3–1.2)
Total Bilirubin: 0.4 mg/dL (ref 0.3–1.2)
Total Protein: 5.6 g/dL — ABNORMAL LOW (ref 6.5–8.1)
Total Protein: 5.9 g/dL — ABNORMAL LOW (ref 6.5–8.1)

## 2021-04-21 LAB — GLUCOSE, CAPILLARY
Glucose-Capillary: 161 mg/dL — ABNORMAL HIGH (ref 70–99)
Glucose-Capillary: 190 mg/dL — ABNORMAL HIGH (ref 70–99)
Glucose-Capillary: 208 mg/dL — ABNORMAL HIGH (ref 70–99)
Glucose-Capillary: 211 mg/dL — ABNORMAL HIGH (ref 70–99)
Glucose-Capillary: 225 mg/dL — ABNORMAL HIGH (ref 70–99)
Glucose-Capillary: 241 mg/dL — ABNORMAL HIGH (ref 70–99)

## 2021-04-21 LAB — BLOOD GAS, ARTERIAL
Acid-base deficit: 2.4 mmol/L — ABNORMAL HIGH (ref 0.0–2.0)
Acid-base deficit: 2.5 mmol/L — ABNORMAL HIGH (ref 0.0–2.0)
Bicarbonate: 23.8 mmol/L (ref 20.0–28.0)
Bicarbonate: 24.3 mmol/L (ref 20.0–28.0)
FIO2: 100
FIO2: 80
MECHVT: 300 mL
O2 Saturation: 93.3 %
O2 Saturation: 96.3 %
PEEP: 12 cmH2O
Patient temperature: 99
Patient temperature: 99.5
RATE: 35 resp/min
pCO2 arterial: 52.2 mmHg — ABNORMAL HIGH (ref 32.0–48.0)
pCO2 arterial: 55.1 mmHg — ABNORMAL HIGH (ref 32.0–48.0)
pH, Arterial: 7.267 — ABNORMAL LOW (ref 7.350–7.450)
pH, Arterial: 7.282 — ABNORMAL LOW (ref 7.350–7.450)
pO2, Arterial: 74.3 mmHg — ABNORMAL LOW (ref 83.0–108.0)
pO2, Arterial: 94.6 mmHg (ref 83.0–108.0)

## 2021-04-21 LAB — BODY FLUID CELL COUNT WITH DIFFERENTIAL
Eos, Fluid: 0 %
Lymphs, Fluid: 3 %
Monocyte-Macrophage-Serous Fluid: 11 % — ABNORMAL LOW (ref 50–90)
Neutrophil Count, Fluid: 86 % — ABNORMAL HIGH (ref 0–25)
Other Cells, Fluid: NONE SEEN %
Total Nucleated Cell Count, Fluid: 360 cu mm (ref 0–1000)

## 2021-04-21 LAB — CBC
HCT: 26.4 % — ABNORMAL LOW (ref 36.0–46.0)
HCT: 26.1 % — ABNORMAL LOW (ref 36.0–46.0)
Hemoglobin: 8.8 g/dL — ABNORMAL LOW (ref 12.0–15.0)
Hemoglobin: 8.4 g/dL — ABNORMAL LOW (ref 12.0–15.0)
MCH: 32.4 pg (ref 26.0–34.0)
MCH: 32.1 pg (ref 26.0–34.0)
MCHC: 33.3 g/dL (ref 30.0–36.0)
MCHC: 32.2 g/dL (ref 30.0–36.0)
MCV: 97.1 fL (ref 80.0–100.0)
MCV: 99.6 fL (ref 80.0–100.0)
Platelets: 219 10*3/uL (ref 150–400)
Platelets: 229 10*3/uL (ref 150–400)
RBC: 2.72 MIL/uL — ABNORMAL LOW (ref 3.87–5.11)
RBC: 2.62 MIL/uL — ABNORMAL LOW (ref 3.87–5.11)
RDW: 14.9 % (ref 11.5–15.5)
RDW: 15.4 % (ref 11.5–15.5)
WBC: 19.8 10*3/uL — ABNORMAL HIGH (ref 4.0–10.5)
WBC: 20.6 10*3/uL — ABNORMAL HIGH (ref 4.0–10.5)
nRBC: 1.8 % — ABNORMAL HIGH (ref 0.0–0.2)
nRBC: 1.6 % — ABNORMAL HIGH (ref 0.0–0.2)

## 2021-04-21 LAB — PROCALCITONIN
Procalcitonin: 4.3 ng/mL
Procalcitonin: 6.81 ng/mL

## 2021-04-21 LAB — MAGNESIUM
Magnesium: 1.5 mg/dL — ABNORMAL LOW (ref 1.7–2.4)
Magnesium: 1.5 mg/dL — ABNORMAL LOW (ref 1.7–2.4)

## 2021-04-21 LAB — PHOSPHORUS
Phosphorus: 4.8 mg/dL — ABNORMAL HIGH (ref 2.5–4.6)
Phosphorus: 4.2 mg/dL (ref 2.5–4.6)

## 2021-04-21 LAB — HEMOGLOBIN A1C
Hgb A1c MFr Bld: 8.8 % — ABNORMAL HIGH (ref 4.8–5.6)
Mean Plasma Glucose: 206 mg/dL

## 2021-04-21 LAB — STREP PNEUMONIAE URINARY ANTIGEN: Strep Pneumo Urinary Antigen: NEGATIVE

## 2021-04-21 LAB — SEDIMENTATION RATE: Sed Rate: 139 mm/hr — ABNORMAL HIGH (ref 0–22)

## 2021-04-21 LAB — LACTIC ACID, PLASMA: Lactic Acid, Venous: 2.5 mmol/L (ref 0.5–1.9)

## 2021-04-21 LAB — TROPONIN I (HIGH SENSITIVITY): Troponin I (High Sensitivity): 22 ng/L — ABNORMAL HIGH (ref <18)

## 2021-04-21 MED ORDER — VASOPRESSIN 20 UNITS/100 ML INFUSION FOR SHOCK
0.0000 [IU]/min | INTRAVENOUS | Status: DC
  Administered 2021-04-21 – 2021-04-22 (×3): 0.03 [IU]/min via INTRAVENOUS
  Filled 2021-04-21 (×5): qty 100

## 2021-04-21 MED ORDER — LACTATED RINGERS IV BOLUS
500.0000 mL | Freq: Once | INTRAVENOUS | Status: AC
  Administered 2021-04-21: 500 mL via INTRAVENOUS

## 2021-04-21 MED ORDER — SODIUM CHLORIDE 0.9% FLUSH
10.0000 mL | INTRAVENOUS | Status: DC | PRN
Start: 2021-04-21 — End: 2021-05-08
  Administered 2021-04-21: 20 mL

## 2021-04-21 MED ORDER — FUROSEMIDE 10 MG/ML IJ SOLN
80.0000 mg | Freq: Once | INTRAMUSCULAR | Status: AC
  Administered 2021-04-21: 80 mg via INTRAVENOUS
  Filled 2021-04-21: qty 8

## 2021-04-21 MED ORDER — PANTOPRAZOLE SODIUM 40 MG IV SOLR
40.0000 mg | Freq: Two times a day (BID) | INTRAVENOUS | Status: DC
  Administered 2021-04-21 – 2021-04-24 (×8): 40 mg via INTRAVENOUS
  Filled 2021-04-21 (×7): qty 40

## 2021-04-21 MED ORDER — MAGNESIUM SULFATE 4 GM/100ML IV SOLN
4.0000 g | Freq: Once | INTRAVENOUS | Status: AC
  Administered 2021-04-21: 4 g via INTRAVENOUS
  Filled 2021-04-21: qty 100

## 2021-04-21 MED ORDER — SODIUM CHLORIDE 0.9% FLUSH
10.0000 mL | Freq: Two times a day (BID) | INTRAVENOUS | Status: DC
  Administered 2021-04-21: 40 mL
  Administered 2021-04-21 – 2021-04-22 (×3): 10 mL
  Administered 2021-04-23: 30 mL
  Administered 2021-04-23 – 2021-04-25 (×4): 10 mL
  Administered 2021-04-26: 20 mL
  Administered 2021-05-01 – 2021-05-06 (×10): 10 mL
  Administered 2021-05-06: 40 mL
  Administered 2021-05-07: 20 mL
  Administered 2021-05-07 – 2021-05-08 (×2): 10 mL

## 2021-04-21 MED ORDER — SODIUM CHLORIDE 0.9 % IV SOLN
2.0000 g | INTRAVENOUS | Status: DC
  Administered 2021-04-21 – 2021-04-22 (×2): 2 g via INTRAVENOUS
  Filled 2021-04-21 (×3): qty 2

## 2021-04-21 MED ORDER — FREE WATER
30.0000 mL | Status: DC
  Administered 2021-04-21 – 2021-04-25 (×24): 30 mL

## 2021-04-21 MED ORDER — ADULT MULTIVITAMIN LIQUID CH
15.0000 mL | Freq: Every day | ORAL | Status: DC
  Administered 2021-04-22 – 2021-05-01 (×10): 15 mL
  Filled 2021-04-21 (×10): qty 15

## 2021-04-21 MED ORDER — PHENYLEPHRINE CONCENTRATED 100MG/250ML (0.4 MG/ML) INFUSION SIMPLE
25.0000 ug/min | INTRAVENOUS | Status: DC
  Administered 2021-04-21: 100 ug/min via INTRAVENOUS
  Administered 2021-04-21: 40 ug/min via INTRAVENOUS
  Filled 2021-04-21 (×2): qty 250

## 2021-04-21 MED ORDER — VITAL HIGH PROTEIN PO LIQD
1000.0000 mL | ORAL | Status: AC
  Administered 2021-04-21 – 2021-04-25 (×4): 1000 mL

## 2021-04-21 MED ORDER — APIXABAN 2.5 MG PO TABS
2.5000 mg | ORAL_TABLET | Freq: Two times a day (BID) | ORAL | Status: DC
  Administered 2021-04-21: 2.5 mg
  Filled 2021-04-21: qty 1

## 2021-04-21 MED ORDER — APIXABAN 5 MG PO TABS
5.0000 mg | ORAL_TABLET | Freq: Two times a day (BID) | ORAL | Status: DC
  Administered 2021-04-21: 5 mg
  Filled 2021-04-21: qty 1

## 2021-04-21 NOTE — Progress Notes (Signed)
Update    0 persistent ileus physiology despite ventilator Synchrony after rocuronium x1.  PEEP of 12 FiO2 80% pulse ox 95%.  Neo-Synephrine requirements have come down a little bit.  Fluid bolus was given but she has now become anuric despite fluid bolus.  Creatinine has increased.  Many of the autoimmune infectious disease labs are still pending  After consent bronchoscopy was done.  Bronchoalveolar lavage did not show any alveolar hemorrhage.  Samples are being sent for analysis.  Goals of care conversation held with the family: Differential diagnosis of alveolar hemorrhage has been ruled out but inflammation, infection and fluid remain.  At this point in time we will hold off on any excessive immunosuppressants [they feel like patient had an unpleasant experience with steroids for temporal arteritis] till autoimmune labs are back.  We discussed overall mortality and prognosis.  They have expressed desire for full medical care but have agreed based on her best interest and substituted judgment for no CPR.  Her son, daughter and her husband were all in alignment.  We did not discuss about CRRT.   Additional 45 minutes critical care except procedure time      SIGNATURE    Dr. Brand Males, M.D., F.C.C.P,  Pulmonary and Critical Care Medicine Staff Physician, [place] Director - Interstitial Lung Disease  Program  Pulmonary [place] [facility] [place], [place], [address]  Pager: [phone], If no answer  OR between  19:00-7:00h: page [phone] Telephone (clinical office): [phone] Telephone (research): [phone]  7:06 PM 04/21/2021   . LABS    PULMONARY Recent Labs  Lab 04/20/21 0950 04/20/21 1641 04/20/21 2138 04/21/21 0010 04/21/21 0916  PHART 7.484* 7.481* 7.221* 7.267* 7.282*  PCO2ART 29.7* 28.1* 63.9* 55.1* 52.2*  PO2ART 58.0* 48.7* 73.7* 94.6 74.3*  HCO3 22.1 20.8 25.3 24.3 23.8   O2SAT 90.1 86.1 90.6 96.3 93.3    CBC Recent Labs  Lab 04/20/21 2242 04/21/21 0430 04/21/21 1522  HGB 9.9* 8.8* 8.4*  HCT 29.6* 26.4* 26.1*  WBC 21.6* 19.8* 20.6*  PLT 242 219 229    COAGULATION Recent Labs  Lab 04/16/21 0538 04/18/2021 1527  INR 1.2 2.2*    CARDIAC  No results for input(s): TROPONINI in the last 168 hours. No results for input(s): PROBNP in the last 168 hours.   CHEMISTRY Recent Labs  Lab 04/21/2021 1527 04/20/21 0248 04/20/21 2242 04/21/21 0430 04/21/21 1522  NA 123* 131* 133* 133* 134*  K 3.5 3.3* 4.2 4.1 4.3  CL 86* 95* 96* 99 100  CO2 _0 GLUCOSE 440* 161* 275* 217* 232*  BUN 31* 23 26* 32* 42*  CREATININE 1.38* 1.16* 1.45* 1.85* 2.55*  CALCIUM 8.6* 8.5* 8.0* 7.5* 7.5*  MG  --   --  1.5* 1.5*  --   PHOS  --   --  4.8* 4.2  --    Estimated Creatinine Clearance: 17 mL/min (A) (by C-G formula based on SCr of 2.55 mg/dL (H)).   LIVER Recent Labs  Lab 04/16/21 0538 04/18/2021 1527 04/20/21 0248 04/20/21 2242 04/21/21 0430 04/21/21 1522  AST 32 40 31 62* 39 40  ALT 73* 55* 48* 51* 43 44  ALKPHOS 46 84 63 72 60 67  BILITOT 0.5 0.5 0.7 0.7 0.5 0.4  PROT 6.6 6.6 6.4* 6.5 5.6* 5.9*  ALBUMIN 3.1* 2.3* 2.3* 2.2* 1.9* 1.8*  INR 1.2 2.2*  --   --   --   --  INFECTIOUS Recent Labs  Lab 05/02/2021 1744 04/20/21 1924 04/20/21 2242 04/21/21 0300 04/21/21 0430  LATICACIDVEN 2.0* 2.4*  --  2.5*  --   PROCALCITON  --   --  4.30  --  6.81     ENDOCRINE CBG (last 3)  Recent Labs    04/21/21 0741 04/21/21 1220 04/21/21 1717  GLUCAP 161* 190* 241*         IMAGING x48h  - image(s) personally visualized  -   highlighted in bold CT CHEST WO CONTRAST  Result Date: 04/20/2021 CLINICAL DATA:  Acute hypoxemic respiratory failure EXAM: CT CHEST WITHOUT CONTRAST TECHNIQUE: Multidetector CT imaging of the chest was performed following the standard protocol without IV contrast. COMPARISON:  09/24/2017, 04/20/2021  FINDINGS: Cardiovascular: Unenhanced imaging of the heart and great vessels demonstrates no pericardial effusion. Dense calcification of the mitral annulus. Normal caliber of the thoracic aorta. Evaluation of the lumen is limited without IV contrast. Mild atherosclerosis of the descending thoracic aorta. Mediastinum/Nodes: Multiple borderline enlarged lymph nodes are seen within the mediastinum, largest in the subcarinal region measuring up to 9 mm in short axis. These may be reactive. Thyroid, trachea, and esophagus are grossly unremarkable. Lungs/Pleura: There is multifocal bilateral airspace disease, greatest in the right lower lobe. There is a mixture of dense consolidation within the lung bases, with more ground-glass attenuation within the upper lung zones. Findings may reflect a combination of edema and infection. There is trace right pleural fluid. No pneumothorax. Central airways are patent. Mild diffuse bronchiectasis. Upper Abdomen: No acute abnormality. Musculoskeletal: No acute or destructive bony lesions. Postsurgical changes are seen from right mastectomy with right breast prosthesis identified. Reconstructed images demonstrate no additional findings. IMPRESSION: 1. Multifocal bilateral airspace disease, with a mixture of dense consolidation and ground-glass opacities. The CT appearance is most characteristic of multifocal pneumonia given the areas of dense consolidation. 2. Subcentimeter mediastinal and hilar lymph nodes, likely reactive. No pathologic adenopathy. 3. Trace right pleural effusion. 4.  Aortic Atherosclerosis (ICD10-I70.0). Electronically Signed   By: Randa Ngo M.D.   On: 04/20/2021 19:43   DG CHEST PORT 1 VIEW  Result Date: 04/21/2021 CLINICAL DATA:  ARDS EXAM: PORTABLE CHEST 1 VIEW COMPARISON:  04/20/2021 FINDINGS: Endotracheal tube remains in good position.  NG tube in the stomach. Diffuse bilateral airspace disease with basilar predominance is unchanged. No significant  pleural effusion and no pneumothorax. IMPRESSION: Diffuse bilateral airspace disease unchanged. Endotracheal tube in good position. Electronically Signed   By: Franchot Gallo M.D.   On: 04/21/2021 09:44   Portable Chest x-ray  Result Date: 04/20/2021 CLINICAL DATA:  Endotracheal tube placement single frontal view of the chest demonstrates endotracheal tube EXAM: PORTABLE CHEST 1 VIEW COMPARISON:  04/20/2021 FINDINGS: Overlying tracheal air column, tip approximately 2 cm above carina. Progressive bilateral perihilar airspace disease, right greater than left. No large effusion or pneumothorax. No acute bony abnormality. IMPRESSION: 1. No complications after intubation. 2. Progressive multifocal bilateral airspace disease, favor edema over infection given rapid progression. Electronically Signed   By: Randa Ngo M.D.   On: 04/20/2021 19:37   DG CHEST PORT 1 VIEW  Result Date: 04/20/2021 CLINICAL DATA:  Respiratory distress. EXAM: PORTABLE CHEST 1 VIEW COMPARISON:  April 19, 2021. FINDINGS: Mildly increased right greater than left midlung and basilar patchy airspace opacities. Superimposed mildly prominent interstitial markings, likely chronic. No visible pleural effusions or pneumothorax. Similar cardiomediastinal silhouette. Right axillary clips. No evidence of acute osseous abnormality. IMPRESSION: Mildly increased right greater than left  midlung and basilar patchy airspace opacities, concerning for multifocal pneumonia. Electronically Signed   By: Margaretha Sheffield MD   On: 04/20/2021 10:32   DG Abd Portable 1V  Result Date: 04/20/2021 CLINICAL DATA:  Orogastric tube placement. EXAM: PORTABLE ABDOMEN - 1 VIEW COMPARISON:  None. FINDINGS: Tip and side port of the enteric tube below the diaphragm in the stomach. Air-filled loops of small and large bowel in the right abdomen. Right upper quadrant surgical clips consistent with cholecystectomy. IMPRESSION: Tip and side port of the enteric tube below the  diaphragm in the stomach. Electronically Signed   By: Keith Rake M.D.   On: 04/20/2021 23:58   ECHOCARDIOGRAM COMPLETE  Result Date: 04/20/2021    ECHOCARDIOGRAM REPORT   Patient Name:   Leslie Bryant Date of Exam: 04/20/2021 Medical Rec #:  [id number]     Height:       62.0 in Accession #:    [id number]    Weight:       164.7 lb Date of Birth:  12-24-1940      BSA:          1.760 m Patient Age:    80 years      BP:           121/70 mmHg Patient Gender: F             HR:           80 bpm. Exam Location:  Inpatient Procedure: 2D Echo, Cardiac Doppler, Color Doppler and Intracardiac            Opacification Agent Indications:    U54.27 Chronic systolic (congestive) heart failure; I48.0                 Paroxysmal atrial fibrillation  History:        Patient has prior history of Echocardiogram examinations, most                 recent 02/07/2020. Risk Factors:Dyslipidemia. GERD. Palpitations.  Sonographer:    Jonelle Sidle Dance Referring Phys: Omer  1. Left ventricular ejection fraction, by estimation, is 55 to 60%. The left ventricle has normal function. The left ventricle has no regional wall motion abnormalities. There is mild left ventricular hypertrophy. Left ventricular diastolic function could not be evaluated.  2. Right ventricular systolic function is moderately reduced. The right ventricular size is moderately enlarged. There is normal pulmonary artery systolic pressure.  3. Left atrial size was severely dilated.  4. The mitral valve is abnormal. Mild to moderate mitral valve regurgitation. Moderate mitral annular calcification.  5. The aortic valve is tricuspid. Aortic valve regurgitation is trivial. No aortic stenosis is present. FINDINGS  Left Ventricle: Left ventricular ejection fraction, by estimation, is 55 to 60%. The left ventricle has normal function. The left ventricle has no regional wall motion abnormalities. Definity contrast agent was given IV to delineate the left  ventricular  endocardial borders. The left ventricular internal cavity size was normal in size. There is mild left ventricular hypertrophy. Left ventricular diastolic function could not be evaluated due to atrial fibrillation. Left ventricular diastolic function could not be evaluated. Right Ventricle: The right ventricular size is moderately enlarged. Right vetricular wall thickness was not well visualized. Right ventricular systolic function is moderately reduced. There is normal pulmonary artery systolic pressure. The tricuspid regurgitant velocity is 2.44 m/s, and with an assumed right atrial pressure of 3 mmHg, the estimated right ventricular systolic pressure is  26.8 mmHg. Left Atrium: Left atrial size was severely dilated. Right Atrium: Right atrial size was normal in size. Pericardium: There is no evidence of pericardial effusion. Mitral Valve: The mitral valve is abnormal. There is mild thickening of the mitral valve leaflet(s). There is mild calcification of the mitral valve leaflet(s). Moderate mitral annular calcification. Mild to moderate mitral valve regurgitation. Tricuspid Valve: The tricuspid valve is grossly normal. Tricuspid valve regurgitation is mild. Aortic Valve: The aortic valve is tricuspid. There is mild aortic valve annular calcification. Aortic valve regurgitation is trivial. No aortic stenosis is present. Pulmonic Valve: The pulmonic valve was normal in structure. Pulmonic valve regurgitation is trivial. Aorta: The aortic root and ascending aorta are structurally normal, with no evidence of dilitation. IAS/Shunts: The atrial septum is grossly normal.  LEFT VENTRICLE PLAX 2D LVIDd:         4.20 cm LVIDs:         2.80 cm LV PW:         1.40 cm LV IVS:        1.10 cm LVOT diam:     1.50 cm LV SV:         22 LV SV Index:   13 LVOT Area:     1.77 cm  RIGHT VENTRICLE          IVC RV Basal diam:  2.70 cm  IVC diam: 2.00 cm TAPSE (M-mode): 1.3 cm LEFT ATRIUM              Index       RIGHT  ATRIUM           Index LA diam:        4.10 cm  2.33 cm/m  RA Area:     16.80 cm LA Vol (A2C):   113.0 ml 64.20 ml/m RA Volume:   44.80 ml  25.45 ml/m LA Vol (A4C):   79.5 ml  45.17 ml/m LA Biplane Vol: 94.8 ml  53.86 ml/m  AORTIC VALVE LVOT Vmax:   74.05 cm/s LVOT Vmean:  49.050 cm/s LVOT VTI:    0.126 m  AORTA Ao Root diam: 3.30 cm Ao Asc diam:  3.40 cm MITRAL VALVE                TRICUSPID VALVE MV Area (PHT): 4.06 cm     TR Peak grad:   23.8 mmHg MV Decel Time: 187 msec     TR Vmax:        244.00 cm/s MV E velocity: 165.50 cm/s MV A velocity: 71.70 cm/s   SHUNTS MV E/A ratio:  2.31         Systemic VTI:  0.13 m                             Systemic Diam: 1.50 cm Mertie Moores MD Electronically signed by Mertie Moores MD Signature Date/Time: 04/20/2021/3:38:06 PM    Final    [place] EKG SITE RITE  Result Date: 04/20/2021 If Site Rite image not attached, placement could not be confirmed due to current cardiac rhythm.

## 2021-04-21 NOTE — Progress Notes (Signed)
Initial Nutrition Assessment  DOCUMENTATION CODES:   Obesity unspecified  INTERVENTION:   Vital HP @50ml /hr- Initiate at 52ml/hr and increase by 16ml/hr q 8 hours until goal rate is reached.   Free water flushes 33ml q4 hours to maintain tube patency   Regimen provides 1200kcal/day, 105g/day protein and 1117ml/day free water  Pt at high refeed risk; recommend monitor potassium, magnesium and phosphorus labs daily until stable  Liquid MVI daily via tube   NUTRITION DIAGNOSIS:   Inadequate oral intake related to inability to eat (pt sedated and ventilated) as evidenced by NPO status.  GOAL:   Provide needs based on ASPEN/SCCM guidelines  MONITOR:   Vent status, Labs, Weight trends, Skin, I & O's, TF tolerance  REASON FOR ASSESSMENT:   Consult Enteral/tube feeding initiation and management  ASSESSMENT:   80 y/o female with h/o HTN, DM, Afib, HLD, GERD, IBS, breast cancer s/p mastectomy, giant cell arthritis, diverticulitis and hiatal hernia who is admitted with CAP, CHF, AKI and septic shock  RD working remotely.  Pt sedated and ventilated. OGT in place. Pt requiring multiple pressors today. Spoke with MD, MD ok to start tube feeds today.   Per chart, pt appears weight stable at baseline.   Medications reviewed and include: colace, solu-cortef, insulin, protonix, miralax, azithromycin, fentanyl, Mg sulfate, phenylephrine, vasopressin   Labs reviewed: Na 133(L), K 4.1 wnl, BUN 32(H), creat 1.85(H), P 4.2 wnl, Mg 1.5(L) Wbc- 19.8(H), Hgb 8.8(L), Hct 26.4(L) cbgs- 225, 161 x 24 hrs AIC 8.8(H)- 6/16  Patient is currently intubated on ventilator support MV: 8.6 L/min Temp (24hrs), Avg:99 F (37.2 C), Min:97.8 F (36.6 C), Max:99.9 F (37.7 C)  Propofol: none   MAP- 64-9mmHg  UOP- 871ml   NUTRITION - FOCUSED PHYSICAL EXAM: Unable to perform at this time   Diet Order:   Diet Order             Diet NPO time specified  Diet effective now                   EDUCATION NEEDS:   No education needs have been identified at this time  Skin:  Skin Assessment: Reviewed RN Assessment  Last BM:  pta  Height:   Ht Readings from Last 1 Encounters:  04/20/21 5\' 2"  (1.575 m)    Weight:   Wt Readings from Last 1 Encounters:  04/21/21 77.7 kg    Ideal Body Weight:  50 kg  BMI:  Body mass index is 31.33 kg/m.  Estimated Nutritional Needs:   Kcal:  847-1078kcal/day  Protein:  >100g/day  Fluid:  1.3-1.5L/day  Koleen Distance MS, RD, LDN Please refer to [facility] for RD and/or RD on-call/weekend/after hours pager

## 2021-04-21 NOTE — Progress Notes (Signed)
[place] Progress Note Patient Name: Leslie Bryant DOB: [birth year] MRN: [id number]   Date of Service  04/21/2021  HPI/Events of Note  Received request to get CXR as patient is post bronchoscopy  eICU Interventions  CXR ordered and reviewed without any findings of complication post bronchoscopy     Intervention Category Intermediate Interventions: Diagnostic test evaluation  Judd Lien 04/21/2021, 9:32 PM

## 2021-04-21 NOTE — Procedures (Signed)
Central Venous Catheter Insertion Procedure Note  JANEY PETRON  [id number]  November 23, 1940  Date:04/21/21  Time:12:04 AM   Provider Performing:Raquell Richer Jerilynn Mages Verlee Monte   Procedure: Insertion of Non-tunneled Central Venous Catheter(36556) with US guidance (69507)   Indication(s) Difficult access  Consent Unable to obtain consent due to emergent nature of procedure.  Anesthesia Topical only with 1% lidocaine   Timeout Verified patient identification, verified procedure, site/side was marked, verified correct patient position, special equipment/implants available, medications/allergies/relevant history reviewed, required imaging and test results available.  Sterile Technique Maximal sterile technique including full sterile barrier drape, hand hygiene, sterile gown, sterile gloves, mask, hair covering, sterile ultrasound probe cover (if used).  Procedure Description Area of catheter insertion was cleaned with chlorhexidine and draped in sterile fashion.  With real-time ultrasound guidance a central venous catheter was placed into the right femoral vein. Nonpulsatile blood flow and easy flushing noted in all ports.  The catheter was sutured in place and sterile dressing applied.  Complications/Tolerance None; patient tolerated the procedure well. Chest X-ray is ordered to verify placement for internal jugular or subclavian cannulation.   Chest x-ray is not ordered for femoral cannulation.  EBL Minimal  Specimen(s) None

## 2021-04-21 NOTE — Procedures (Signed)
Arterial Catheter Insertion Procedure Note  NANDINI BOGDANSKI  [id number]  03/07/1941  Date:04/21/21  Time:12:06 AM    Provider Performing: Maryjane Hurter    Procedure: Insertion of Arterial Line [phone]) with US guidance (53614)   Indication(s) Blood pressure monitoring and/or need for frequent ABGs  Consent Risks of the procedure as well as the alternatives and risks of each were explained to the patient and/or caregiver.  Consent for the procedure was obtained and is signed in the bedside chart  Anesthesia Lidocaine 1% wo epinephrine   Time Out Verified patient identification, verified procedure, site/side was marked, verified correct patient position, special equipment/implants available, medications/allergies/relevant history reviewed, required imaging and test results available.   Sterile Technique Maximal sterile technique including full sterile barrier drape, hand hygiene, sterile gown, sterile gloves, mask, hair covering, sterile ultrasound probe cover (if used).   Procedure Description Area of catheter insertion was cleaned with chlorhexidine and draped in sterile fashion. With real-time ultrasound guidance an arterial catheter was placed into the right femoral artery.  Appropriate arterial tracings confirmed on monitor.     Complications/Tolerance None; patient tolerated the procedure well.   EBL Minimal   Specimen(s) None

## 2021-04-21 NOTE — Op Note (Signed)
Name:  [name] MRN:  [id number] DOB:  [birth year]  PROCEDURE NOTE  Procedure(s): Flexible bronchoscopy [phone]) Bronchial alveolar lavage [phone]) of the Right middle lobe  Indications:  ARDS  Consent:  Procedure, benefits, risks and alternatives discussed.  Questions answered.  Consent obtained.  Anesthesia:  Deep sedation wit roc for ARDS on vent.   Location: ICU [address]  Procedure summary:  Appropriate equipment was assembled.  The patient wd identified as Leslie Bryant with 21-Mar-1941  Safety timeout was performed.   After the appropriate level of deep sedation was assured, roc x 1 given . Flexible video bronchoscope was lubricated and inserted through the endotracheal tube Total of 0 mL of 1% Lidocaine were administered through the bronchoscope   Airway examination was NOT performed bilaterally to subsegmental level but the trchea, carina, RMB, RUL and RLL airways looked normal Minimal clear secretions were noted, mucosa appeared normal and no endobronchial lesions were identified.  Bronchial alveolar lavage of the right middle lbove was performed with 80 mL of normal saline  . Total return of 35 mL of pink grey fluid, after which the bronchoscope was withdrawn.    Post-procedure chest x-ray was yes ordered.  Specimens sent: Bronchial alveolar lavage specimen of the RML for cell count ), microbiology and cytology.  Complications:  No immediate complications were noted.  Hemodynamic parameters and oxygenation remained stable throughout the procedure.  Estimated blood loss:  none  IMPRESSION 1. Ltd airway exam normal 2. BAL - RML 3.   Followup Future Appointments  Date Time Provider [place]  05/04/2021  8:15 AM Deboraha Sprang, MD [facility]     Dr. Brand Males, M.D., F.C.C.P Pulmonary and Critical Care Medicine Staff Physician [facility] Pager: [phone], If no answer or  between  15:00h - 7:00h: call [phone]  04/21/2021 6:40 PM

## 2021-04-21 NOTE — Progress Notes (Signed)
Pharmacy Antibiotic Note  Leslie Bryant is a 80 y.o. female admitted on 04/28/2021 with pneumonia.  Pharmacy has been consulted for Cefepime dosing.  Plan: Continue Azithromycin 500mg  I q24 Begin Cefepime 2gm q24 Ceftriaxone d/c  Height: 5\' 2"  (157.5 cm) Weight: 77.7 kg (171 lb 4.8 oz) IBW/kg (Calculated) : 50.1  Temp (24hrs), Avg:99 F (37.2 C), Min:97.8 F (36.6 C), Max:99.9 F (37.7 C)  Recent Labs  Lab 04/16/21 0538 04/05/2021 1527 04/23/2021 1744 04/20/21 0248 04/20/21 1924 04/20/21 2242 04/21/21 0300 04/21/21 0430  WBC 9.1 15.7*  --  10.6*  --  21.6*  --  19.8*  CREATININE 0.90 1.38*  --  1.16*  --  1.45*  --  1.85*  LATICACIDVEN  --  2.5* 2.0*  --  2.4*  --  2.5*  --     Estimated Creatinine Clearance: 23.4 mL/min (A) (by C-G formula based on SCr of 1.85 mg/dL (H)).    Allergies  Allergen Reactions   Crestor [Rosuvastatin Calcium] Other (See Comments)    Shoulder pain   Crestor [Rosuvastatin]     Other reaction(s): Unknown   Lipitor [Atorvastatin Calcium] Other (See Comments)    Myalgias    Pravachol [Pravastatin] Other (See Comments)    myalgias   Penicillins Itching    Has patient had a PCN reaction causing immediate rash, facial/tongue/throat swelling, SOB or lightheadedness with hypotension: No Has patient had a PCN reaction causing severe rash involving mucus membranes or skin necrosis: No Has patient had a PCN reaction that required hospitalization No Has patient had a PCN reaction occurring within the last 10 years: No If all of the above answers are "NO", then may proceed with Cephalosporin use.     Antimicrobials this admission: 6/16 Azithromycin >>  6/16 Ceftriaxone >> 6/17 6/18 Cefepime >>  Dose adjustments this admission:  Microbiology results: 6/16 BCx: sent 6/16 UCx: sent  6/18 Trach aspirate: sent  6/16 MRSA PCR: neg  Thank you for allowing pharmacy to be a part of this patient's care.  Minda Ditto PharmD 04/21/2021 9:44 AM

## 2021-04-21 NOTE — Progress Notes (Signed)
42ml of versed wasted through IV tube priming in the presence of RN Orlie Pollen

## 2021-04-21 NOTE — Progress Notes (Signed)
[place] Progress Note Patient Name: Leslie Bryant DOB: 25-Nov-1940 MRN: [id number]   Date of Service  04/21/2021  HPI/Events of Note  KUB needs to be reviewed for NG tube placement.  eICU Interventions  NG tube in good position in the stomach.        Davyn Elsasser U [name] 04/21/2021, 12:12 AM

## 2021-04-21 NOTE — Progress Notes (Signed)
[facility] ADULT ICU REPLACEMENT PROTOCOL   The patient does apply for the [facility] Adult ICU Electrolyte Replacment Protocol based on the criteria listed below:   1. Is GFR >/= 30 ml/min? No.  Patient's GFR today is 27 2. Is SCr </= 2? Yes.   Patient's SCr is 1.85 ml/kg/hr 3. Did SCr increase >/= 0.5 in 24 hours? Yes.   4. Abnormal electrolyte(s): mag 1.5 5. Ordered repletion with: standing orders  6. If a panic level lab has been reported, has the CCM MD in charge been notified? No..   Darlys Gales 04/21/2021 6:25 AM

## 2021-04-21 NOTE — Consult Note (Signed)
NAME:  Leslie Bryant MRN:  [id number] DOB:  Nov 04, 1941 LOS: 2 ADMISSION DATE:  05/01/2021 CONSULTATION DATE: 04/20/2021 REFERRING MD:  Alfredia Ferguson - TR[facility] CHIEF COMPLAINT:  SOB, hypoxic respiratory failure   BRIEF  80 year old female with PMHx significant for HTN, Afib (on Eliquis, s/p cardioversion x 2) and GCA (managed with prednisone) who presented to [facility] 6/16 via EMS for worsening SOB, hypoxia and concern for PNA.  Per chart review/discussion with patient's husband bedside, patient recently had a fall while she was gardening (bent forward too far and fell headfirst onto a rock). She sustained some bruising to her face but was otherwise unharmed; no LOC or dizziness at the time of fall. Subsequently, patient had another fall 6/13 after getting out of bed (per husband, legs "buckled", no mechanical etiology of fall and no dizziness/LOC). EMS was called to help get patient up and she was brought to [facility] ED for evaluation. CT Head/CT C-spine were negative and labs were notable for hyponatremia; UA was negative and hypotension on presentation resolved with fluid resuscitation. She was discharged home from ED.  On 6/16, patient presented to PCP [facility]) for evaluation of weakness with recent falls/post-hospital f/u. At that time reported SOB, weakness, and slight productive cough. Has had some weight gain recently in the setting of prednisone for GCA, some increased LE edema. O2 sat at PCP's office was 89% on RA at rest, dropping to 87% with exertion. Subsequently directed to ED for further evaluation and management. On ED evaluation, O2 saturation was 85% on RA and patient was in Afib with RVR (rates 130s-150s). CXR demonsrated bilateral lower lobe infiltrates. Labs were notable for Na 123, BNP 467, Trop 20 and WBC 15K. Patient was admitted to [facility].  PCCM consulted 6/17 for ongoing hypoxemic respiratory failure despite supplemental O2.  Pertinent Medical History:    has a past medical history of Acute  meniscal tear of knee (LEFT KNEE), Allergic rhinitis, Allergy, Arthritis, Atrial fibrillation ([place]), Breast cancer, right breast ([place]) (1986), Bright's disease, Diverticulitis of large intestine with perforation (11/02/2011), Diverticulosis of colon, GERD (gastroesophageal reflux disease), H/O hiatal hernia, Headache, History of kidney stones, Hypercholesterolemia, Left knee pain, Palpitations (IRREGULAR HEARTBEAT--  CONTROLLED W/  BETA BLOCKER), Pneumonia (2017), and Swelling of left knee joint.   has a past surgical history that includes Knee arthroscopy with medial menisectomy (09/08/2012); Chondroplasty (09/08/2012); Total knee arthroplasty (Left, 07/21/2013); Colonoscopy; Vaginal hysterectomy (1966); Cholecystectomy open (1988); Cardiac catheterization (12-22-2000  DR BRACKBILL); Mastectomy (Right, 1986); Breast reconstruction (Right, 1987); Reduction mammaplasty (Left, 1987); Colonoscopy w/ biopsies and polypectomy; Total knee arthroplasty (Right, 02/19/2017); Breast biopsy (Right, 1986); Breast biopsy (Left); Cardioversion (N/A, 03/23/2020); Cardioversion (N/A, 07/20/2020); and Artery Biopsy (Left, 01/16/2021).     Significant Hospital Events: Including procedures, antibiotic start and stop dates in addition to other pertinent events   6/16 - Admitted to [facility] for SOB, hypoxia, multifocal PNA 6/17 - PCCM consulted for persistent hypoxia, mild lethargy, ICU admission -> intubated later in day. CT chest with bilateral diffuse GGO.  Failed PICC line insertion   Interim History / Subjective:    6/18 - worsening AKI - creat 1.8. on vent 80% fio2, supnine. Sedated with fent gtt, versed gtt. On pressors neo. Afebrile. Mag repletd. PCT high but culture negative so far. HS trop in 20s. ECHO - RV moderately enlarged and some reduction in RVEF (baseline eliquis for A Fib). On fent gtt 150 versed 41m -> RASS -4  On protoni and coffee ground mild returns noted this  morning  Objective:  Blood pressure (!)  105/49, pulse 75, temperature 98.1 F (36.7 C), temperature source Axillary, resp. rate 16, height _0  (1.575 m), weight 77.7 kg, SpO2 96 %.    Vent Mode: PRVC FiO2 (%):  [70 %-100 %] 100 % Set Rate:  [35 bmp] 35 bmp Vt Set:  [300 mL] 300 mL PEEP:  [10 cmH20-12 cmH20] 12 cmH20 Plateau Pressure:  [24 cmH20-28 cmH20] 28 cmH20   Intake/Output Summary (Last 24 hours) at 04/21/2021 0751 Last data filed at 04/21/2021 0600 Gross per 24 hour  Intake 1176.85 ml  Output 800 ml  Net 376.85 ml   Filed Weights   04/10/2021 2030 04/20/21 0600 04/21/21 0216  Weight: 75.7 kg 74.7 kg 77.7 kg   Physical Examination: General Appearance:  Looks criticall ill OBESE - + Head:  Normocephalic, without obvious abnormality, atraumatic Eyes:  PERRL - yes, conjunctiva/corneas - muddy. RACCOON EYES from fall     Ears:  Normal external ear canals, both ears Nose:  G tube - no Throat:  ETT TUBE - yes , OG tube - yes Neck:  Supple,  No enlargement/tenderness/nodules Lungs: Clear to auscultation bilaterally, Ventilator   Synchrony - no, fio2 80%, peep 12 Heart:  S1 and S2 normal, no murmur, CVP - no.  Pressors - neo 155m Abdomen:  Soft, no masses, no organomegaly Genitalia / Rectal:  Not done Extremities:  Extremities- intact Skin:  ntact in exposed areas . Sacral area - not examined Neurologic:  Sedation - fen gtt, versed gtt -> RASS - -4 . Moves all 4s - no. CAM-ICU - x . Orientation - x     Labs/imaging that I have personally reviewed: (right click and "Reselect all SmartList Selections" daily)  WBC 10.6 (15.7), H&H 15.2/43.3 (suspect hemoconcentration), Plt 144 (151)  Na 131 (133), K 3.3 (3.5), CO2 24, BUN 23 (31), Cr 1.16 (1.38) Ca 8.5 (8.6), AST 31 (40), ALT 48 (55), AP 63 (87), Tbili 0.7 (0.5)  Glucoses 114-319 last 24H  ABG: 7.484 / 29.7 / 58 / 22.1  Trop: 20 (20)  TSH 0.115 T4 2.27  UA >500 glucose, trace Hgb, protein 30, nitrite +, leuk -  BCx, UCx pending   LABS     PULMONARY Recent Labs  Lab 04/20/21 0950 04/20/21 1641 04/20/21 2138 04/21/21 0010  PHART 7.484* 7.481* 7.221* 7.267*  PCO2ART 29.7* 28.1* 63.9* 55.1*  PO2ART 58.0* 48.7* 73.7* 94.6  HCO3 22.1 20.8 25.3 24.3  O2SAT 90.1 86.1 90.6 96.3    CBC Recent Labs  Lab 04/20/21 0248 04/20/21 2242 04/21/21 0430  HGB 15.2* 9.9* 8.8*  HCT 43.3 29.6* 26.4*  WBC 10.6* 21.6* 19.8*  PLT 144* 242 219    COAGULATION Recent Labs  Lab 04/16/21 0538 04/07/2021 1527  INR 1.2 2.2*    CARDIAC  No results for input(s): TROPONINI in the last 168 hours. No results for input(s): PROBNP in the last 168 hours.   CHEMISTRY Recent Labs  Lab 04/16/21 0538 04/08/2021 1527 04/20/21 0248 04/20/21 2242 04/21/21 0430  NA 128* 123* 131* 133* 133*  K 3.7 3.5 3.3* 4.2 4.1  CL 88* 86* 95* 96* 99  CO2 _1 GLUCOSE 123* 440* 161* 275* 217*  BUN 21 31* 23 26* 32*  CREATININE 0.90 1.38* 1.16* 1.45* 1.85*  CALCIUM 8.9 8.6* 8.5* 8.0* 7.5*  MG  --   --   --  1.5* 1.5*  PHOS  --   --   --  4.8* 4.2   Estimated Creatinine Clearance: 23.4 mL/min (A) (by C-G formula based on SCr of 1.85 mg/dL (H)).   LIVER Recent Labs  Lab 04/16/21 0538 04/08/2021 1527 04/20/21 0248 04/20/21 2242 04/21/21 0430  AST 32 40 31 62* 39  ALT 73* 55* 48* 51* 43  ALKPHOS 46 84 63 72 60  BILITOT 0.5 0.5 0.7 0.7 0.5  PROT 6.6 6.6 6.4* 6.5 5.6*  ALBUMIN 3.1* 2.3* 2.3* 2.2* 1.9*  INR 1.2 2.2*  --   --   --      INFECTIOUS Recent Labs  Lab 04/07/2021 1744 04/20/21 1924 04/20/21 2242 04/21/21 0300 04/21/21 0430  LATICACIDVEN 2.0* 2.4*  --  2.5*  --   PROCALCITON  --   --  4.30  --  6.81     ENDOCRINE CBG (last 3)  Recent Labs    04/20/21 2315 04/21/21 0454 04/21/21 0741  GLUCAP 270* 225* 161*         IMAGING x48h  - image(s) personally visualized  -   highlighted in bold CT CHEST WO CONTRAST  Result Date: 04/20/2021 CLINICAL DATA:  Acute hypoxemic respiratory failure EXAM: CT CHEST  WITHOUT CONTRAST TECHNIQUE: Multidetector CT imaging of the chest was performed following the standard protocol without IV contrast. COMPARISON:  09/24/2017, 04/20/2021 FINDINGS: Cardiovascular: Unenhanced imaging of the heart and great vessels demonstrates no pericardial effusion. Dense calcification of the mitral annulus. Normal caliber of the thoracic aorta. Evaluation of the lumen is limited without IV contrast. Mild atherosclerosis of the descending thoracic aorta. Mediastinum/Nodes: Multiple borderline enlarged lymph nodes are seen within the mediastinum, largest in the subcarinal region measuring up to 9 mm in short axis. These may be reactive. Thyroid, trachea, and esophagus are grossly unremarkable. Lungs/Pleura: There is multifocal bilateral airspace disease, greatest in the right lower lobe. There is a mixture of dense consolidation within the lung bases, with more ground-glass attenuation within the upper lung zones. Findings may reflect a combination of edema and infection. There is trace right pleural fluid. No pneumothorax. Central airways are patent. Mild diffuse bronchiectasis. Upper Abdomen: No acute abnormality. Musculoskeletal: No acute or destructive bony lesions. Postsurgical changes are seen from right mastectomy with right breast prosthesis identified. Reconstructed images demonstrate no additional findings. IMPRESSION: 1. Multifocal bilateral airspace disease, with a mixture of dense consolidation and ground-glass opacities. The CT appearance is most characteristic of multifocal pneumonia given the areas of dense consolidation. 2. Subcentimeter mediastinal and hilar lymph nodes, likely reactive. No pathologic adenopathy. 3. Trace right pleural effusion. 4.  Aortic Atherosclerosis (ICD10-I70.0). Electronically Signed   By: Randa Ngo M.D.   On: 04/20/2021 19:43   Portable Chest x-ray  Result Date: 04/20/2021 CLINICAL DATA:  Endotracheal tube placement single frontal view of the chest  demonstrates endotracheal tube EXAM: PORTABLE CHEST 1 VIEW COMPARISON:  04/20/2021 FINDINGS: Overlying tracheal air column, tip approximately 2 cm above carina. Progressive bilateral perihilar airspace disease, right greater than left. No large effusion or pneumothorax. No acute bony abnormality. IMPRESSION: 1. No complications after intubation. 2. Progressive multifocal bilateral airspace disease, favor edema over infection given rapid progression. Electronically Signed   By: Randa Ngo M.D.   On: 04/20/2021 19:37   DG CHEST PORT 1 VIEW  Result Date: 04/20/2021 CLINICAL DATA:  Respiratory distress. EXAM: PORTABLE CHEST 1 VIEW COMPARISON:  April 19, 2021. FINDINGS: Mildly increased right greater than left midlung and basilar patchy airspace opacities. Superimposed mildly prominent interstitial markings, likely chronic. No visible pleural effusions or pneumothorax.  Similar cardiomediastinal silhouette. Right axillary clips. No evidence of acute osseous abnormality. IMPRESSION: Mildly increased right greater than left midlung and basilar patchy airspace opacities, concerning for multifocal pneumonia. Electronically Signed   By: Margaretha Sheffield MD   On: 04/20/2021 10:32   DG Chest Portable 1 View  Result Date: 04/04/2021 CLINICAL DATA:  Shortness of breath.  Weakness.  Now with cough. EXAM: PORTABLE CHEST 1 VIEW COMPARISON:  02/02/2019 FINDINGS: Stable cardiomediastinal contours. Diffuse coarsened interstitial markings are identified bilaterally. New bilateral lower lung zone airspace opacities are noted, right greater than left. The visualized osseous structures appear intact. Right axillary surgical clips and breast prosthesis noted. IMPRESSION: 1. New bilateral lower lung zone airspace opacities, right greater than left. Imaging findings concerning for multifocal pneumonia. 2. Chronic interstitial lung disease. Electronically Signed   By: Kerby Moors M.D.   On: 04/20/2021 16:11   DG Abd Portable  1V  Result Date: 04/20/2021 CLINICAL DATA:  Orogastric tube placement. EXAM: PORTABLE ABDOMEN - 1 VIEW COMPARISON:  None. FINDINGS: Tip and side port of the enteric tube below the diaphragm in the stomach. Air-filled loops of small and large bowel in the right abdomen. Right upper quadrant surgical clips consistent with cholecystectomy. IMPRESSION: Tip and side port of the enteric tube below the diaphragm in the stomach. Electronically Signed   By: Keith Rake M.D.   On: 04/20/2021 23:58   ECHOCARDIOGRAM COMPLETE  Result Date: 04/20/2021    ECHOCARDIOGRAM REPORT   Patient Name:   Leslie Bryant Date of Exam: 04/20/2021 Medical Rec #:  [id number]     Height:       62.0 in Accession #:    [id number]    Weight:       164.7 lb Date of Birth:  [birth year]      BSA:          1.760 m Patient Age:    59 years      BP:           121/70 mmHg Patient Gender: F             HR:           80 bpm. Exam Location:  Inpatient Procedure: 2D Echo, Cardiac Doppler, Color Doppler and Intracardiac            Opacification Agent Indications:    H84.69 Chronic systolic (congestive) heart failure; I48.0                 Paroxysmal atrial fibrillation  History:        Patient has prior history of Echocardiogram examinations, most                 recent 02/07/2020. Risk Factors:Dyslipidemia. GERD. Palpitations.  Sonographer:    Jonelle Sidle Dance Referring Phys: [place]  1. Left ventricular ejection fraction, by estimation, is 55 to 60%. The left ventricle has normal function. The left ventricle has no regional wall motion abnormalities. There is mild left ventricular hypertrophy. Left ventricular diastolic function could not be evaluated.  2. Right ventricular systolic function is moderately reduced. The right ventricular size is moderately enlarged. There is normal pulmonary artery systolic pressure.  3. Left atrial size was severely dilated.  4. The mitral valve is abnormal. Mild to moderate mitral valve  regurgitation. Moderate mitral annular calcification.  5. The aortic valve is tricuspid. Aortic valve regurgitation is trivial. No aortic stenosis is present. FINDINGS  Left Ventricle: Left ventricular ejection fraction,  by estimation, is 55 to 60%. The left ventricle has normal function. The left ventricle has no regional wall motion abnormalities. Definity contrast agent was given IV to delineate the left ventricular  endocardial borders. The left ventricular internal cavity size was normal in size. There is mild left ventricular hypertrophy. Left ventricular diastolic function could not be evaluated due to atrial fibrillation. Left ventricular diastolic function could not be evaluated. Right Ventricle: The right ventricular size is moderately enlarged. Right vetricular wall thickness was not well visualized. Right ventricular systolic function is moderately reduced. There is normal pulmonary artery systolic pressure. The tricuspid regurgitant velocity is 2.44 m/s, and with an assumed right atrial pressure of 3 mmHg, the estimated right ventricular systolic pressure is 55.7 mmHg. Left Atrium: Left atrial size was severely dilated. Right Atrium: Right atrial size was normal in size. Pericardium: There is no evidence of pericardial effusion. Mitral Valve: The mitral valve is abnormal. There is mild thickening of the mitral valve leaflet(s). There is mild calcification of the mitral valve leaflet(s). Moderate mitral annular calcification. Mild to moderate mitral valve regurgitation. Tricuspid Valve: The tricuspid valve is grossly normal. Tricuspid valve regurgitation is mild. Aortic Valve: The aortic valve is tricuspid. There is mild aortic valve annular calcification. Aortic valve regurgitation is trivial. No aortic stenosis is present. Pulmonic Valve: The pulmonic valve was normal in structure. Pulmonic valve regurgitation is trivial. Aorta: The aortic root and ascending aorta are structurally normal, with no  evidence of dilitation. IAS/Shunts: The atrial septum is grossly normal.  LEFT VENTRICLE PLAX 2D LVIDd:         4.20 cm LVIDs:         2.80 cm LV PW:         1.40 cm LV IVS:        1.10 cm LVOT diam:     1.50 cm LV SV:         22 LV SV Index:   13 LVOT Area:     1.77 cm  RIGHT VENTRICLE          IVC RV Basal diam:  2.70 cm  IVC diam: 2.00 cm TAPSE (M-mode): 1.3 cm LEFT ATRIUM              Index       RIGHT ATRIUM           Index LA diam:        4.10 cm  2.33 cm/m  RA Area:     16.80 cm LA Vol (A2C):   113.0 ml 64.20 ml/m RA Volume:   44.80 ml  25.45 ml/m LA Vol (A4C):   79.5 ml  45.17 ml/m LA Biplane Vol: 94.8 ml  53.86 ml/m  AORTIC VALVE LVOT Vmax:   74.05 cm/s LVOT Vmean:  49.050 cm/s LVOT VTI:    0.126 m  AORTA Ao Root diam: 3.30 cm Ao Asc diam:  3.40 cm MITRAL VALVE                TRICUSPID VALVE MV Area (PHT): 4.06 cm     TR Peak grad:   23.8 mmHg MV Decel Time: 187 msec     TR Vmax:        244.00 cm/s MV E velocity: 165.50 cm/s MV A velocity: 71.70 cm/s   SHUNTS MV E/A ratio:  2.31         Systemic VTI:  0.13 m  Systemic Diam: 1.50 cm Mertie Moores MD Electronically signed by Mertie Moores MD Signature Date/Time: 04/20/2021/3:38:06 PM    Final    [place] EKG SITE RITE  Result Date: 04/20/2021 If Site Rite image not attached, placement could not be confirmed due to current cardiac rhythm.      Resolved Hospital Problem List:     Assessment & Plan:  Acute hypoxemic respiratory failure in the setting of multifocal PNA with likely component of pulmonary edema - Continue respiratory support with HFNC/BiPAP - BiPAP 4 hours on, 2 hours off during the day + QHS - Repeat ABG after 4 hours on BiPAP - Titrate FiO2 for O2 sat > 90% - Bronchodilators as appropriate (Xopenex, Atrovent) - Pulmonary hygiene - Continue Ceftriaxone, Azithromycin - Daily CXR - F/u CT Chest to better characterize infiltrates  Concern for CHF, present on admission Last Echo 02/2020 with LVEF  60-65%, normal RV function. Utilizes Lasix at home. - Assess diuresis needs daily - F/u repeat Echo 6/17  Atrial fibrillation with RVR, present on admission History of Afib prior to admission, presented with Afib with RVR. Takes Eliquis at home as well as amiodarone for rate control. Per TRH note, query amiodarone lung toxicity as possible etiology of hypoxemia. - Continue diltiazem gtt - Continue metoprolol - Amiodarone held by primary team - Continue Eliquis  History of giant cell arteritis, present on admission Diagnosed with GCA, managed with prednisone. Per patient, has gained weight since prednisone initiated. - Continue prednisone   ASSESSMENT / PLAN:  PULMONARY  A:  Severe Acute Respiratory Failure due to ARDS - present on admission, deterioratoon 6/17 resulting in intubation  04/21/2021 -> Supine, ARDS protocol on vent 80% fip2, peep 12 and well sedated RASS-4 but with mild vent dysynchnro. ARDS Ddx - infectious v inflammation (amio) v autoimmune  P:   ARDS protocol with deep sedation Do ROC x 1 -> check aBG -> might need cntinouous nimbex -> then decide on prone See ID seciton for Rx for etiology   NEUROLOGIC A:   Baseline hx of Giant Cell Arteritis on chronic steroids - prior to admissioin Hx of falls with "[place] Eyes" prior to admission - CT head 04/16/21 - negtive NEuro intact at admission  04/21/2021 - deeply sedated on ventilator due to ARDS   P:   RASS sedtion goal -4/-5; continue fent gtt and versed gtt Hydrocort steroids for arteritis   VASCULAR A:   Circulatory shock - new onset 04/20/21 post intubation  04/21/2021 - persists needs neo 157mg. Likely due to sepsis  P:  Neo gtt Add vaso gtt Fluid bolus  x 1 MAP goal > 65   CARDIAC STRUCTURAL A: Mild new onset RV failure at admit 04/20/21. Unlikely PE due to baseline eliquis Trop flat  P: monitor  CARDIAC ELECTRICAL A: Hx of A Fib on amio -on eliquis (amio held at admit, cardizem stopped  6/17 pre intubatiojn)  04/21/2021- HR 80-90s in slow A Fib . Off amio. Off cardizem   P: Monitor [place] eliquis   INFECTIOUS A:   Clinical suspicion for bacterial pneumonia NOS (high PCT + pulmonary infiltrates)  - Urine strep 04/21/21 - NEG  - MRSA PCR 04/29/2021 - NEG  - UA  04/20/2021 - NEG  - HIV 04/23/2021 - Non Reac   - covid and flu pcr 04/13/2021 - NEG  - Blood culture 04/28/2021 >>  - multiplex RVP 04/20/21 >>>  - Trach aspirate 04/21/21 >>>  - Autoimmune and vasculitis panel 6/17.22 >>  -  ESR 04/1721 - 139 (baseline 50)  - - QUantiferon Gold 04/23/2021 >>  - G6PD 6/19 >>   04/21/2021 -> afebile, but PCT rising. No clear etiology. High PCT suggests bacterial process but amio tox weights    P:   Azithro 6/16>> Cefriaxone 6/16>>6/18., cefepime 6/18  >>  Can consider bronch BAL (rule out Alv hge or infection or AIP) Hydrocort stress dos steroids in stting of chroic steroids (hopefully can address potential coexistent amio tox)     RENAL A:  [place] creat 0.45m% 04/16/21 AKI at admit - creat 1.370m  04/21/2021 - worsening creat 1.64m75m P:  Fluid bolus and reasess   ELECTROLYTES A:  Moderate hyponatremia present on admit  04/21/2021 - Na better but has hypomagnesemia   P: Replete Mag   GASTROINTESTINAL A:   Baseline present on admit: Diverticulitis of large intestine with perforation (11/02/2011), Diverticulosis of colon, GERD (gastroesophageal reflux disease), H/O hiatal hernia  04/21/2021 - ne onset mild coffe colored og tube return   P:   Change PPI to IV Nutrn consult for  TF  HEMATOLOGIC   - HEME A:  [place] mild anemia prior to admit - 11.8gm% to 12.7gm%  04/21/2021- wrosening anemia. ? Alv hemorrhage   P:  - PRBC for hgb </= 6.9gm%    - exceptions are   -  if ACS susepcted/confirmed then transfuse for hgb </= 8.0gm%,  or    -  active bleeding with hemodynamic instability, then transfuse regardless of hemoglobin value   At at all times try to  transfuse 1 unit prbc as possible with exception of active hemorrhage Might need bronc   HEMATOLOGIC - Platelets A AT risk thrombocytipnia  P Continue eliquis for a fib  ENDOCRINE A:   At risk hyperglycemia    P:   ssi  MSK/DERM Hx of falls prior to admit - prob due to low Sodium an dhypoxemia  Plan monitor   Best Practice: (right click and "Reselect all SmartList Selections" daily)  Diet:  Oral Pain/Anxiety/Delirium protocol (if indicated): No VAP protocol (if indicated): Not indicated DVT prophylaxis: Systemic AC - Eliquis GI prophylaxis: PPI Glucose control:  SSI Yes Central venous access:   Rt femoral line 04/20/21 > Arterial line:  Rt femoral a line 04/20/21>> Foley:  YES since 04/20/21 >> Mobility:  bed rest  PT consulted: Yes Last date of multidisciplinary goals of care discussion  - 04/21/2021  - called husband Bart.Explained ARDS and potential need for bronch. Explained critical care.He also wanted me to call daughter ShiPenni Homans [phone] Status:  full code  Disposition: ICU      ATTESTATION & SIGNATURE   The patient PatLatamara MeldertJaggers critically ill with multiple organ systems failure and requires high complexity decision making for assessment and support, frequent evaluation and titration of therapies, application of advanced monitoring technologies and extensive interpretation of multiple databases.   Critical Care Time devoted to patient care services described in this note is  90  Minutes. This time reflects time of care of this signee Dr MurBrand Maleshis critical care time does not reflect procedure time, or teaching time or supervisory time of PA/NP/Med student/Med Resident etc but could involve care discussion time     Dr. MurBrand Males.D., F.COwensboro Ambulatory Surgical Facility LtdP Pulmonary and Critical Care Medicine Staff Physician ConSterlinglmonary and Critical Care Pager: [phone] no answer or between  15:00h - 7:00h:  call [phone]  04/21/2021 7:52 AM

## 2021-04-22 ENCOUNTER — Inpatient Hospital Stay (HOSPITAL_COMMUNITY): Payer: Medicare Other

## 2021-04-22 DIAGNOSIS — N390 Urinary tract infection, site not specified: Secondary | ICD-10-CM

## 2021-04-22 DIAGNOSIS — J9601 Acute respiratory failure with hypoxia: Secondary | ICD-10-CM | POA: Diagnosis not present

## 2021-04-22 DIAGNOSIS — J80 Acute respiratory distress syndrome: Secondary | ICD-10-CM | POA: Diagnosis not present

## 2021-04-22 DIAGNOSIS — B9689 Other specified bacterial agents as the cause of diseases classified elsewhere: Secondary | ICD-10-CM

## 2021-04-22 DIAGNOSIS — N179 Acute kidney failure, unspecified: Secondary | ICD-10-CM | POA: Diagnosis not present

## 2021-04-22 LAB — COMPREHENSIVE METABOLIC PANEL
ALT: 38 U/L (ref 0–44)
AST: 27 U/L (ref 15–41)
Albumin: 1.8 g/dL — ABNORMAL LOW (ref 3.5–5.0)
Alkaline Phosphatase: 69 U/L (ref 38–126)
Anion gap: 9 (ref 5–15)
BUN: 51 mg/dL — ABNORMAL HIGH (ref 8–23)
CO2: 23 mmol/L (ref 22–32)
Calcium: 7.5 mg/dL — ABNORMAL LOW (ref 8.9–10.3)
Chloride: 103 mmol/L (ref 98–111)
Creatinine, Ser: 3.24 mg/dL — ABNORMAL HIGH (ref 0.44–1.00)
GFR, Estimated: 14 mL/min — ABNORMAL LOW (ref >60)
Glucose, Bld: 213 mg/dL — ABNORMAL HIGH (ref 70–99)
Potassium: 4.4 mmol/L (ref 3.5–5.1)
Sodium: 135 mmol/L (ref 135–145)
Total Bilirubin: 0.4 mg/dL (ref 0.3–1.2)
Total Protein: 5.8 g/dL — ABNORMAL LOW (ref 6.5–8.1)

## 2021-04-22 LAB — URINE CULTURE: Culture: >=100000 — AB

## 2021-04-22 LAB — BLOOD GAS, ARTERIAL
Acid-base deficit: 7.2 mmol/L — ABNORMAL HIGH (ref 0.0–2.0)
Acid-base deficit: 5 mmol/L — ABNORMAL HIGH (ref 0.0–2.0)
Bicarbonate: 20.9 mmol/L (ref 20.0–28.0)
Bicarbonate: 20.5 mmol/L (ref 20.0–28.0)
Drawn by: 270211
FIO2: 80
FIO2: 70
MECHVT: 350 mL
O2 Saturation: 97.6 %
O2 Saturation: 98.9 %
PEEP: 14 cmH2O
Patient temperature: 98.1
Patient temperature: 98.6
RATE: 35 resp/min
pCO2 arterial: 59.5 mmHg — ABNORMAL HIGH (ref 32.0–48.0)
pCO2 arterial: 43.2 mmHg (ref 32.0–48.0)
pH, Arterial: 7.168 — CL (ref 7.350–7.450)
pH, Arterial: 7.298 — ABNORMAL LOW (ref 7.350–7.450)
pO2, Arterial: 106 mmHg (ref 83.0–108.0)
pO2, Arterial: 131 mmHg — ABNORMAL HIGH (ref 83.0–108.0)

## 2021-04-22 LAB — HEPARIN LEVEL (UNFRACTIONATED)
Heparin Unfractionated: >1.1 IU/mL — ABNORMAL HIGH (ref 0.30–0.70)
Heparin Unfractionated: >1.1 IU/mL — ABNORMAL HIGH (ref 0.30–0.70)

## 2021-04-22 LAB — GLUCOSE, CAPILLARY
Glucose-Capillary: 185 mg/dL — ABNORMAL HIGH (ref 70–99)
Glucose-Capillary: 205 mg/dL — ABNORMAL HIGH (ref 70–99)
Glucose-Capillary: 206 mg/dL — ABNORMAL HIGH (ref 70–99)
Glucose-Capillary: 225 mg/dL — ABNORMAL HIGH (ref 70–99)
Glucose-Capillary: 237 mg/dL — ABNORMAL HIGH (ref 70–99)
Glucose-Capillary: 240 mg/dL — ABNORMAL HIGH (ref 70–99)
Glucose-Capillary: 274 mg/dL — ABNORMAL HIGH (ref 70–99)

## 2021-04-22 LAB — CBC
HCT: 25.5 % — ABNORMAL LOW (ref 36.0–46.0)
Hemoglobin: 8 g/dL — ABNORMAL LOW (ref 12.0–15.0)
MCH: 32.1 pg (ref 26.0–34.0)
MCHC: 31.4 g/dL (ref 30.0–36.0)
MCV: 102.4 fL — ABNORMAL HIGH (ref 80.0–100.0)
Platelets: 214 10*3/uL (ref 150–400)
RBC: 2.49 MIL/uL — ABNORMAL LOW (ref 3.87–5.11)
RDW: 15.7 % — ABNORMAL HIGH (ref 11.5–15.5)
WBC: 19.4 10*3/uL — ABNORMAL HIGH (ref 4.0–10.5)
nRBC: 2.6 % — ABNORMAL HIGH (ref 0.0–0.2)

## 2021-04-22 LAB — APTT
aPTT: 39 seconds — ABNORMAL HIGH (ref 24–36)
aPTT: 79 seconds — ABNORMAL HIGH (ref 24–36)

## 2021-04-22 LAB — GLUCOSE 6 PHOSPHATE DEHYDROGENASE
G6PDH: 14.4 U/g{Hb} (ref 4.8–15.7)
Hemoglobin: 8.6 g/dL — ABNORMAL LOW (ref 11.1–15.9)

## 2021-04-22 LAB — PHOSPHORUS: Phosphorus: 6.2 mg/dL — ABNORMAL HIGH (ref 2.5–4.6)

## 2021-04-22 LAB — SJOGRENS SYNDROME-B EXTRACTABLE NUCLEAR ANTIBODY: SSB (La) (ENA) Antibody, IgG: <0.2 AI (ref 0.0–0.9)

## 2021-04-22 LAB — ANTI-DNA ANTIBODY, DOUBLE-STRANDED: ds DNA Ab: <1 IU/mL (ref 0–9)

## 2021-04-22 LAB — ANTI-SCLERODERMA ANTIBODY: Scleroderma (Scl-70) (ENA) Antibody, IgG: <0.2 AI (ref 0.0–0.9)

## 2021-04-22 LAB — SJOGRENS SYNDROME-A EXTRACTABLE NUCLEAR ANTIBODY: SSA (Ro) (ENA) Antibody, IgG: <0.2 AI (ref 0.0–0.9)

## 2021-04-22 LAB — PROCALCITONIN: Procalcitonin: 9.12 ng/mL

## 2021-04-22 LAB — MAGNESIUM: Magnesium: 3.1 mg/dL — ABNORMAL HIGH (ref 1.7–2.4)

## 2021-04-22 MED ORDER — HYDROCORTISONE NA SUCCINATE PF 100 MG IJ SOLR
50.0000 mg | Freq: Two times a day (BID) | INTRAMUSCULAR | Status: DC
  Administered 2021-04-22 – 2021-04-24 (×5): 50 mg via INTRAVENOUS
  Filled 2021-04-22 (×5): qty 2

## 2021-04-22 MED ORDER — HEPARIN (PORCINE) 25000 UT/250ML-% IV SOLN
750.0000 [IU]/h | INTRAVENOUS | Status: DC
  Administered 2021-04-22 – 2021-04-23 (×2): 750 [IU]/h via INTRAVENOUS
  Filled 2021-04-22 (×2): qty 250

## 2021-04-22 MED ORDER — FUROSEMIDE 10 MG/ML IJ SOLN
80.0000 mg | Freq: Once | INTRAMUSCULAR | Status: AC
  Administered 2021-04-22: 80 mg via INTRAVENOUS
  Filled 2021-04-22: qty 8

## 2021-04-22 NOTE — Progress Notes (Signed)
Pharmacy: [facility]  Patient's an 80 y.o F with giant Cell Arteritis on chronic steroids and afib on Eliquis PTA who was instructed by her PCP to come to the ED on 6/16 for workup of SOB, fever and leg swelling.  She was found to be in afib with RVR in the ED and subsequently intubated on 6/17 for respiratory failure. Eliquis resumed on admission, but due to clinical status anticoag. changed to heparin drip on 6/19.  - last dose of Eliquis given on 6/18 at 2156  - heparin level >1.1 as expected with apixaban still on board - aPTT 79 seconds, therapeutic - per RN no bleeding noted - scr up 3.24 (crcl~13)   Goal of Therapy:  Heparin level 0.3-0.7 units/ml aPTT 66-102 seconds Monitor platelets by anticoagulation protocol: Yes  Plan: Continue heparin drip at 750 units/hr Check 8 hour aPTT, hep level Daily CBC  Leslie Bryant RPh 04/22/2021, 10:15 PM

## 2021-04-22 NOTE — Consult Note (Signed)
NAME:  Leslie Bryant MRN:  [id number] DOB:  1941-03-26 LOS: 3 ADMISSION DATE:  05/02/2021 CONSULTATION DATE: 04/20/2021 REFERRING MD:  Alfredia Ferguson - [facility] CHIEF COMPLAINT:  SOB, hypoxic respiratory failure    BRIEF  80 year old female with PMHx significant for HTN, Afib (on AMio x 18-24 months PTA, Eliquis, s/p cardioversion x 2) and GCA (managed with prednisone 35m -> 335mx 3-4 months PTA) who [facility] for worsening SOB, hypoxia and concern for PNA.  Per chart review/discussion with patient's husband bedside, patient recently had a fall while she was gardening (bent forward too far and fell headfirst onto a rock). She sustained some bruising to her face but was otherwise unharmed; no LOC or dizziness at the time of fall. Subsequently, patient had another fall 6/13 after getting out of bed (per husband, legs "buckled", no mechanical etiology of fall and no dizziness/LOC). EMS was called to help get patient up and [facility] CT Head/CT C-spine were negative and labs were notable for hyponatremia; UA was negative and hypotension on presentation resolved with fluid resuscitation. She was discharged home from ED.  [facility] 6[facility]16, [facility]/post-hospital f/u. At that time reported SOB, weakness, and slight productive cough. Has had some weight gain recently in the setting of prednisone for GCA, some increased LE edema. O2 sat at PCP's office was 89% on RA at rest, dropping to 87% with exertion. Subsequently directed to ED for further evaluation and management. On ED evaluation, O2 saturation was 85% on RA and patient was in Afib with RVR (rates 130s-150s). CXR demonsrated bilateral lower lobe infiltrates. Labs were notable for Na 123, BNP 467, Trop 20 and WBC 15K. [facility]17 for ongoing hypoxemic respiratory failure despite supplemental O2.  Pertinent  Medical History:    has a past medical history of Acute meniscal tear of knee (LEFT KNEE), Allergic rhinitis, Allergy, Arthritis, Atrial fibrillation ([place] Breast cancer, right breast ([place]), Bright's disease, Diverticulitis of large intestine with perforation (11/02/2011), Diverticulosis of colon, GERD (gastroesophageal reflux disease), H/O hiatal hernia, Headache, History of kidney stones, Hypercholesterolemia, Left knee pain, Palpitations (IRREGULAR HEARTBEAT--  CONTROLLED W/  BETA BLOCKER), Pneumonia (2017), and Swelling of left knee joint.   has a past surgical history that includes Knee arthroscopy with medial menisectomy (09/08/2012); Chondroplasty (09/08/2012); Total knee arthroplasty (Left, 07/21/2013); Colonoscopy; Vaginal hysterectomy (1966); Cholecystectomy open (1988); Cardiac catheterization (12-22-2000  DR BRACKBILL); Mastectomy (Right, 1986); Breast reconstruction (Right, 1987); Reduction mammaplasty (Left, 1987); Colonoscopy w/ biopsies and polypectomy; Total knee arthroplasty (Right, 02/19/2017); Breast biopsy (Right, 1986); Breast biopsy (Left); Cardioversion (N/A, 03/23/2020); Cardioversion (N/A, 07/20/2020); and Artery Biopsy (Left, 01/16/2021).     Significant Hospital Events: Including procedures, antibiotic start and stop dates in addition to other pertinent events   [facility]ocal [facility]/17 - PCCM consulted for persistent hypoxia, mild lethargy, ICU admission -> intubated later in day. CT chest with bilateral diffuse GGO.  Failed PICC line insertion 6/18 AM - worsening AKI - creat 1.8. on vent 80% fio2, supnine. Sedated with fent gtt, versed gtt. On pressors neo. Afebrile. Mag repletd. PCT high but culture negative so far. HS trop in 20s. ECHO - RV moderately enlarged and some reduction in RVEF (baseline eliquis for A Fib). On fent gtt 150 versed 74m47m> RASS -4. On protoni and coffee ground  mild returns noted this morning 6/18 PM - -in renal  failure. Worsening creat. Dropping Ur op despite fluids. Bronch BAL -> 86% polys.  Alv  hge ruled out.  Elliquis now lower dose. Code changed to DNR + full medical care   Interim History / Subjective:   6/.19 - worsening renal failure. PCT now worse at 9 . Urine culture from admit - growing Klebsiella Afebrile but wbc up (on hydrocort stress dose).   On vent 70% fio2., peep 14  - . PF ratio improved to 151.  ABG with worsening acidosis.  Tidal volume increased.    On neo and vasopressin gtt  - but down to minimal need.   On fent gtt, versed gtt. -> sedation hold for WUA ->  RASs -4.    On TF  Objective:  Blood pressure (!) 99/56, pulse 82, temperature 98.4 F (36.9 C), temperature source Oral, resp. rate (!) 35, height _0  (1.575 m), weight 77.7 kg, SpO2 99 %.    Vent Mode: PRVC FiO2 (%):  [70 %-100 %] 70 % Set Rate:  [35 bmp] 35 bmp Vt Set:  [300 mL-350 mL] 350 mL PEEP:  [12 cmH20-14 cmH20] 14 cmH20 Plateau Pressure:  [26 cmH20-31 cmH20] 31 cmH20   Intake/Output Summary (Last 24 hours) at 04/22/2021 0917 Last data filed at 04/22/2021 0800 Gross per 24 hour  Intake 2629.75 ml  Output 180 ml  Net 2449.75 ml   Filed Weights   04/04/2021 2030 04/20/21 0600 04/21/21 0216  Weight: 75.7 kg 74.7 kg 77.7 kg     General Appearance:  Looks criticall ill OBESE - + Head:  Normocephalic, without obvious abnormality, atraumatic Eyes:  PERRL - yes, conjunctiva/corneas - muddy. RACCOON EYES +     Ears:  Normal external ear canals, both ears Nose:  G tube - no Throat:  ETT TUBE - yes , OG tube - yes Neck:  Supple,  No enlargement/tenderness/nodules Lungs: Clear to auscultation bilaterally, Ventilator   Synchrony - yes, 70% fio2, peep 14, PF ratio 151 Heart:  S1 and S2 normal, no murmur, CVP - `x.  Pressors - neo and vaso - dose coming down Abdomen:  Soft, no masses, no organomegaly Genitalia / Rectal:  Not done Extremities:  Extremities- intact Skin:  ntact in exposed areas . Sacral  area - not examined Neurologic:  Sedation - fent gtt, versed gtt, roc prn (last dose yesteday) -> on WUA -> RASS - -4 . Moves all 4s - no. CAM-ICU - x . Orientation - x      Labs/imaging that I have personally reviewed: (right click and "Reselect all SmartList Selections" daily)  WBC 10.6 (15.7), H&H 15.2/43.3 (suspect hemoconcentration), Plt 144 (151)  Na 131 (133), K 3.3 (3.5), CO2 24, BUN 23 (31), Cr 1.16 (1.38) Ca 8.5 (8.6), AST 31 (40), ALT 48 (55), AP 63 (87), Tbili 0.7 (0.5)  Glucoses 114-319 last 24H  ABG: 7.484 / 29.7 / 58 / 22.1  Trop: 20 (20)  TSH 0.115 T4 2.27  UA >500 glucose, trace Hgb, protein 30, nitrite +, leuk -  BCx, UCx 6/16 - [facility] Problem List:     Assessment & Plan:    ASSESSMENT / PLAN:   A:  Severe Acute Respiratory Failure due to ARDS due to sesops - present on admission, deterioratoon 6/17 resulting in intubation    04/22/2021 - > does NOT meet criteria for SBT/Extubation in setting of Acute Respiratory Failure due to severe ARDS .  PF ratio 151 and improving but had worsening metabolic/respiratory acidosis and tidal volume increased    P:   Check stat ABG and response to increase tidal volume ARDS protocol with deep sedation (currently on holoday) Do ROC  prn Conntinue supine vent (no indication right now to prone or do continuous nimbex due to PF 151) See ID seciton for Rx for etiology    A:   Baseline hx of Giant Cell Arteritis on chronic steroids - prior to admissioin Hx of falls with "Battle Eyes" prior to admission - CT head 04/16/21 - negtive NEuro intact at admission  04/22/2021 - deeply sedated on ventilator due to ARDS. On WUA of sedation gtt -> RASS -4   P:   RASS sedtion goal -4/-5; continue fent gtt and versed gtt but with ongoing WUA Hydrocort steroids for arteritis -> reduce to q12h on 04/22/21   A:   Circulatory shock - new onset 04/20/21 post intubation  04/22/2021 - persists but  improved dosing. On neo and vaso  P:  NEOgtt vaso gtt MAP goal > 65 Consider giaprezza if shock worsens  A: Mild new onset RV failure at admit 04/20/21. Unlikely PE due to baseline eliquis Trop flat  P: monitor   A: Hx of A Fib on amio -on eliquis (amio held at admit, cardizem stopped 6/17 pre intubatiojn)  04/22/2021- HR 80-90s in slow A Fib . Off amio. Off cardizem   P: Monitor Dc Eliquis due to renal failiure -> start heparin gtt   A:   Baseline immunosuppressed due to chronic steroids Admitted with severe sepsis and septic shock at admission  - initial clinical suspicion pneumonia but dx revised 04/22/21 to Klebsiella UTI at admit    - Urine strep 04/21/21 - NEG  - MRSA PCR 04/12/2021 - NEG  - UA  04/09/2021 - NEG  - Urine culture 6/16 - > 100K Klebsiella r  - HIV 04/04/2021 - Non Reac   - covid and flu pcr 04/21/2021 - NEG  - Blood culture 04/08/2021 >>  - multiplex RVP 04/20/21  via trach aspirate>>>  - Trach aspirate 04/21/21 >>>  - Autoimmune and vasculitis panel (neg in 2018)  6/17.22 >>  -ESR 04/1721 - 139 (baseline 50)  - Bronch BAL 6/19 ( 80% polys culture, TB, Virus, cytology ) >>>> - - QUantiferon Gold 04/23/2021 >>  - G6PD 6/19 >>  04/22/21  - dx is community acquired (not CAUTI)  UTI Klebisella    P:   Azithro 6/16>> Cefriaxone 6/16>>6/18., cefepime 6/18  >>  Await  bronch BAL 04/22/21 Reduce hydrocort stress dose 04/22/21    A:  Baseline creat 0.65m% 04/16/21 AKI at admit - creat 1.383m -> renal failure 04/21/21   04/22/2021 - now in renal failure despite lasix.  ABG earlier with mixed respiratory and metabolic acidosis and tidal volume increased  P:  Check ABG stat -> might need to consider bicarb infusion Renal US[place]ule out hydro Renal consult Dr Sc[place] Ouch   A:  Moderate hyponatremia present on admit  04/22/21 - resolved hyponatermia. Has hypermagnesemia and hyperphosphatemia   P: monitor    A:   Baseline present on admit: Diverticulitis of  large intestine with perforation (11/02/2011), Diverticulosis of colon, GERD (gastroesophageal reflux disease), H/O hiatal hernia  04/22/2021 - ne onset mild coffe colored og tube return 04/21/2021 but subsequently settled.  No active bleeding.  No alveolar hemorrhage on bronchoscopy 04/21/2021.  Tolerating tube feeds   P:   PPI  at  IV Tube feeds ongoing   A:  Baesline mild anemia prior to admit - 11.8gm% to 12.7gm%.  No alveolar hemorrhage on bronchoscopy 04/21/2021  04/22/2021- wrosening anemia of critical illness [alveolar hemorrhage ruled out 04/21/2021, bronchoscopy)   P:  - PRBC for hgb </= 6.9gm%    - exceptions are   -  if ACS susepcted/confirmed then transfuse for hgb </= 8.0gm%,  or    -  active bleeding with hemodynamic instability, then transfuse regardless of hemoglobin value   At at all times try to transfuse 1 unit prbc as possible with exception of active hemorrhage Might need bronc    A AT risk thrombocytipnia  P Monitor platelets after change of Eliquis to heparin on 04/22/2021   A:   At risk hyperglycemia    P:   ssi  MSK/DERM Hx of falls prior to admit - prob due to low Sodium and hypoxemia  Plan monitor  CODE 04/21/21 -DNR plus full medical care [CRRT question not fully resolved]   Best Practice: (right click and "Reselect all SmartList Selections" daily)  Diet:  Oral Pain/Anxiety/Delirium protocol (if indicated): No VAP protocol (if indicated): Not indicated DVT prophylaxis: Systemic AC - Eliquis GI prophylaxis: PPI Glucose control:  SSI Yes Central venous access:   Rt femoral line 04/20/21 > Arterial line:  Rt femoral a line 04/20/21>> Foley:  YES since 04/20/21 >> Mobility:  bed rest  PT consulted: Yes Last date of multidisciplinary goals of care discussion  - 04/21/2021  - called husband Bart.Explained ARDS and potential need for bronch. Explained critical care.He also wanted me to call daughter Penni Homans -> [phone].  Detailed goals  of care p.m. of 04/21/2021: CODE STATUS changed to DNR with full medical care..  On 04/22/2021 current husband and biological son updated at the bedside   Code Status: DNR but full medical care   Disposition: ICU       ATTESTATION & SIGNATURE   The patient MIKYA DON is critically ill with multiple organ systems failure and requires high complexity decision making for assessment and support, frequent evaluation and titration of therapies, application of advanced monitoring technologies and extensive interpretation of multiple databases.   Critical Care Time devoted to patient care services described in this note is  60  Minutes. This time reflects time of care of this signee Dr Brand Males. This critical care time does not reflect procedure time, or teaching time or supervisory time of PA/NP/Med student/Med Resident etc but could involve care discussion time     Dr. Brand Males, M.D., [facility].C.P Pulmonary and Critical Care Medicine Staff Physician [facility] Pager: [phone], If no answer or between  15:00h - 7:00h: call [phone]  04/22/2021 9:18 AM    LABS    PULMONARY Recent Labs  Lab 04/20/21 1641 04/20/21 2138 04/21/21 0010 04/21/21 0916 04/22/21 0326  PHART 7.481* 7.221* 7.267* 7.282* 7.168*  PCO2ART 28.1* 63.9* 55.1* 52.2* 59.5*  PO2ART 48.7* 73.7* 94.6 74.3* 106  HCO3 20.8 25.3 24.3 23.8 20.9  O2SAT 86.1 90.6 96.3 93.3 97.6    CBC Recent Labs  Lab 04/21/21 0430 04/21/21 1522 04/22/21 0401  HGB 8.8* 8.4* 8.0*  HCT 26.4* 26.1* 25.5*  WBC 19.8* 20.6* 19.4*  PLT 219 229 214    COAGULATION Recent Labs  Lab 04/16/21 0538 04/22/2021 1527  INR 1.2 2.2*    CARDIAC  No results for input(s): TROPONINI in the  last 168 hours. No results for input(s): PROBNP in the last 168 hours.   CHEMISTRY Recent Labs  Lab 04/20/21 0248 04/20/21 2242 04/21/21 0430 04/21/21 1522 04/22/21 0401  NA  131* 133* 133* 134* 135  K 3.3* 4.2 4.1 4.3 4.4  CL 95* 96* 99 100 103  CO2 _0 GLUCOSE 161* 275* 217* 232* 213*  BUN 23 26* 32* 42* 51*  CREATININE 1.16* 1.45* 1.85* 2.55* 3.24*  CALCIUM 8.5* 8.0* 7.5* 7.5* 7.5*  MG  --  1.5* 1.5*  --  3.1*  PHOS  --  4.8* 4.2  --  6.2*   Estimated Creatinine Clearance: 13.4 mL/min (A) (by C-G formula based on SCr of 3.24 mg/dL (H)).   LIVER Recent Labs  Lab 04/16/21 0538 04/21/2021 1527 04/20/21 0248 04/20/21 2242 04/21/21 0430 04/21/21 1522 04/22/21 0401  AST 32 40 31 62* 39 40 27  ALT 73* 55* 48* 51* 43 44 38  ALKPHOS 46 84 63 72 60 67 69  BILITOT 0.5 0.5 0.7 0.7 0.5 0.4 0.4  PROT 6.6 6.6 6.4* 6.5 5.6* 5.9* 5.8*  ALBUMIN 3.1* 2.3* 2.3* 2.2* 1.9* 1.8* 1.8*  INR 1.2 2.2*  --   --   --   --   --      INFECTIOUS Recent Labs  Lab 04/21/2021 1744 04/20/21 1924 04/20/21 2242 04/21/21 0300 04/21/21 0430 04/22/21 0401  LATICACIDVEN 2.0* 2.4*  --  2.5*  --   --   PROCALCITON  --   --  4.30  --  6.81 9.12     ENDOCRINE CBG (last 3)  Recent Labs    04/21/21 2346 04/22/21 0332 04/22/21 0819  GLUCAP 208* 206* 185*         IMAGING x48h  - image(s) personally visualized  -   highlighted in bold CT CHEST WO CONTRAST  Result Date: 04/20/2021 CLINICAL DATA:  Acute hypoxemic respiratory failure EXAM: CT CHEST WITHOUT CONTRAST TECHNIQUE: Multidetector CT imaging of the chest was performed following the standard protocol without IV contrast. COMPARISON:  09/24/2017, 04/20/2021 FINDINGS: Cardiovascular: Unenhanced imaging of the heart and great vessels demonstrates no pericardial effusion. Dense calcification of the mitral annulus. Normal caliber of the thoracic aorta. Evaluation of the lumen is limited without IV contrast. Mild atherosclerosis of the descending thoracic aorta. Mediastinum/Nodes: Multiple borderline enlarged lymph nodes are seen within the mediastinum, largest in the subcarinal region measuring up to 9 mm in  short axis. These may be reactive. Thyroid, trachea, and esophagus are grossly unremarkable. Lungs/Pleura: There is multifocal bilateral airspace disease, greatest in the right lower lobe. There is a mixture of dense consolidation within the lung bases, with more ground-glass attenuation within the upper lung zones. Findings may reflect a combination of edema and infection. There is trace right pleural fluid. No pneumothorax. Central airways are patent. Mild diffuse bronchiectasis. Upper Abdomen: No acute abnormality. Musculoskeletal: No acute or destructive bony lesions. Postsurgical changes are seen from right mastectomy with right breast prosthesis identified. Reconstructed images demonstrate no additional findings. IMPRESSION: 1. Multifocal bilateral airspace disease, with a mixture of dense consolidation and ground-glass opacities. The CT appearance is most characteristic of multifocal pneumonia given the areas of dense consolidation. 2. Subcentimeter mediastinal and hilar lymph nodes, likely reactive. No pathologic adenopathy. 3. Trace right pleural effusion. 4.  Aortic Atherosclerosis (ICD10-I70.0). Electronically Signed   By: Randa Ngo M.D.   On: 04/20/2021 19:43   DG CHEST PORT 1 VIEW  Result Date: 04/22/2021 CLINICAL  DATA:  Adult respiratory distress syndrome. Endotracheal tube present. EXAM: PORTABLE CHEST 1 VIEW COMPARISON:  04/21/2021 FINDINGS: Endotracheal tube and nasogastric tube remain in appropriate position. Heart size is stable. Bilateral pulmonary airspace disease is again seen which predominates in the peripheral lung zones, right side greater than left. This shows no significant change compared to previous study. No evidence of pneumothorax or pleural effusion. IMPRESSION: Stable bilateral pulmonary airspace disease, right side greater than left. Electronically Signed   By: Marlaine Hind M.D.   On: 04/22/2021 06:13   DG Chest Port 1 View  Result Date: 04/21/2021 CLINICAL DATA:   ARDS, status post bronchoscopy EXAM: PORTABLE CHEST 1 VIEW COMPARISON:  04/21/2021 at 6:40 a.m. FINDINGS: Single frontal view of the chest demonstrates endotracheal tube overlying tracheal air column tip at thoracic inlet. Enteric catheter passes below diaphragm tip excluded by collimation and side port projecting over gastric fundus. Cardiac silhouette is stable. There are persistent ground-glass opacities throughout the lungs, with denser consolidation at the periphery at the lung bases. No effusion. No evidence of pneumothorax. IMPRESSION: 1. Stable pulmonary opacities consistent with ARDS. 2. Support devices as above. 3. No evidence of complication after bronchoscopy. Electronically Signed   By: Randa Ngo M.D.   On: 04/21/2021 19:44   DG CHEST PORT 1 VIEW  Result Date: 04/21/2021 CLINICAL DATA:  ARDS EXAM: PORTABLE CHEST 1 VIEW COMPARISON:  04/20/2021 FINDINGS: Endotracheal tube remains in good position.  NG tube in the stomach. Diffuse bilateral airspace disease with basilar predominance is unchanged. No significant pleural effusion and no pneumothorax. IMPRESSION: Diffuse bilateral airspace disease unchanged. Endotracheal tube in good position. Electronically Signed   By: Franchot Gallo M.D.   On: 04/21/2021 09:44   Portable Chest x-ray  Result Date: 04/20/2021 CLINICAL DATA:  Endotracheal tube placement single frontal view of the chest demonstrates endotracheal tube EXAM: PORTABLE CHEST 1 VIEW COMPARISON:  04/20/2021 FINDINGS: Overlying tracheal air column, tip approximately 2 cm above carina. Progressive bilateral perihilar airspace disease, right greater than left. No large effusion or pneumothorax. No acute bony abnormality. IMPRESSION: 1. No complications after intubation. 2. Progressive multifocal bilateral airspace disease, favor edema over infection given rapid progression. Electronically Signed   By: Randa Ngo M.D.   On: 04/20/2021 19:37   DG CHEST PORT 1 VIEW  Result Date:  04/20/2021 CLINICAL DATA:  Respiratory distress. EXAM: PORTABLE CHEST 1 VIEW COMPARISON:  April 19, 2021. FINDINGS: Mildly increased right greater than left midlung and basilar patchy airspace opacities. Superimposed mildly prominent interstitial markings, likely chronic. No visible pleural effusions or pneumothorax. Similar cardiomediastinal silhouette. Right axillary clips. No evidence of acute osseous abnormality. IMPRESSION: Mildly increased right greater than left midlung and basilar patchy airspace opacities, concerning for multifocal pneumonia. Electronically Signed   By: Margaretha Sheffield MD   On: 04/20/2021 10:32   DG Abd Portable 1V  Result Date: 04/20/2021 CLINICAL DATA:  Orogastric tube placement. EXAM: PORTABLE ABDOMEN - 1 VIEW COMPARISON:  None. FINDINGS: Tip and side port of the enteric tube below the diaphragm in the stomach. Air-filled loops of small and large bowel in the right abdomen. Right upper quadrant surgical clips consistent with cholecystectomy. IMPRESSION: Tip and side port of the enteric tube below the diaphragm in the stomach. Electronically Signed   By: Keith Rake M.D.   On: 04/20/2021 23:58   ECHOCARDIOGRAM COMPLETE  Result Date: 04/20/2021    ECHOCARDIOGRAM REPORT   Patient Name:   LASHANTE FRYBERGER Date of Exam: 04/20/2021 Medical Rec #:  [id number]     Height:       62.0 in Accession #:    [id number]    Weight:       164.7 lb Date of Birth:  01/19/1941      BSA:          1.760 m Patient Age:    28 years      BP:           121/70 mmHg Patient Gender: F             HR:           80 bpm. Exam Location:  Inpatient Procedure: 2D Echo, Cardiac Doppler, Color Doppler and Intracardiac            Opacification Agent Indications:    O06.00 Chronic systolic (congestive) heart failure; I48.0                 Paroxysmal atrial fibrillation  History:        Patient has prior history of Echocardiogram examinations, most                 recent 02/07/2020. Risk Factors:Dyslipidemia. GERD.  Palpitations.  Sonographer:    Jonelle Sidle Dance Referring Phys: [place]  1. Left ventricular ejection fraction, by estimation, is 55 to 60%. The left ventricle has normal function. The left ventricle has no regional wall motion abnormalities. There is mild left ventricular hypertrophy. Left ventricular diastolic function could not be evaluated.  2. Right ventricular systolic function is moderately reduced. The right ventricular size is moderately enlarged. There is normal pulmonary artery systolic pressure.  3. Left atrial size was severely dilated.  4. The mitral valve is abnormal. Mild to moderate mitral valve regurgitation. Moderate mitral annular calcification.  5. The aortic valve is tricuspid. Aortic valve regurgitation is trivial. No aortic stenosis is present. FINDINGS  Left Ventricle: Left ventricular ejection fraction, by estimation, is 55 to 60%. The left ventricle has normal function. The left ventricle has no regional wall motion abnormalities. Definity contrast agent was given IV to delineate the left ventricular  endocardial borders. The left ventricular internal cavity size was normal in size. There is mild left ventricular hypertrophy. Left ventricular diastolic function could not be evaluated due to atrial fibrillation. Left ventricular diastolic function could not be evaluated. Right Ventricle: The right ventricular size is moderately enlarged. Right vetricular wall thickness was not well visualized. Right ventricular systolic function is moderately reduced. There is normal pulmonary artery systolic pressure. The tricuspid regurgitant velocity is 2.44 m/s, and with an assumed right atrial pressure of 3 mmHg, the estimated right ventricular systolic pressure is 45.9 mmHg. Left Atrium: Left atrial size was severely dilated. Right Atrium: Right atrial size was normal in size. Pericardium: There is no evidence of pericardial effusion. Mitral Valve: The mitral valve is abnormal.  There is mild thickening of the mitral valve leaflet(s). There is mild calcification of the mitral valve leaflet(s). Moderate mitral annular calcification. Mild to moderate mitral valve regurgitation. Tricuspid Valve: The tricuspid valve is grossly normal. Tricuspid valve regurgitation is mild. Aortic Valve: The aortic valve is tricuspid. There is mild aortic valve annular calcification. Aortic valve regurgitation is trivial. No aortic stenosis is present. Pulmonic Valve: The pulmonic valve was normal in structure. Pulmonic valve regurgitation is trivial. Aorta: The aortic root and ascending aorta are structurally normal, with no evidence of dilitation. IAS/Shunts: The atrial septum is grossly normal.  LEFT VENTRICLE PLAX 2D LVIDd:  4.20 cm LVIDs:         2.80 cm LV PW:         1.40 cm LV IVS:        1.10 cm LVOT diam:     1.50 cm LV SV:         22 LV SV Index:   13 LVOT Area:     1.77 cm  RIGHT VENTRICLE          IVC RV Basal diam:  2.70 cm  IVC diam: 2.00 cm TAPSE (M-mode): 1.3 cm LEFT ATRIUM              Index       RIGHT ATRIUM           Index LA diam:        4.10 cm  2.33 cm/m  RA Area:     16.80 cm LA Vol (A2C):   113.0 ml 64.20 ml/m RA Volume:   44.80 ml  25.45 ml/m LA Vol (A4C):   79.5 ml  45.17 ml/m LA Biplane Vol: 94.8 ml  53.86 ml/m  AORTIC VALVE LVOT Vmax:   74.05 cm/s LVOT Vmean:  49.050 cm/s LVOT VTI:    0.126 m  AORTA Ao Root diam: 3.30 cm Ao Asc diam:  3.40 cm MITRAL VALVE                TRICUSPID VALVE MV Area (PHT): 4.06 cm     TR Peak grad:   23.8 mmHg MV Decel Time: 187 msec     TR Vmax:        244.00 cm/s MV E velocity: 165.50 cm/s MV A velocity: 71.70 cm/s   SHUNTS MV E/A ratio:  2.31         Systemic VTI:  0.13 m                             Systemic Diam: 1.50 cm Mertie Moores MD Electronically signed by Mertie Moores MD Signature Date/Time: 04/20/2021/3:38:06 PM    Final    [place] EKG SITE RITE  Result Date: 04/20/2021 If Site Rite image not attached, placement could not be  confirmed due to current cardiac rhythm.

## 2021-04-22 NOTE — Progress Notes (Signed)
[place] for Heparin Indication: atrial fibrillation  Allergies  Allergen Reactions   Crestor [Rosuvastatin Calcium] Other (See Comments)    Shoulder pain   Crestor [Rosuvastatin]     Other reaction(s): Unknown   Lipitor [Atorvastatin Calcium] Other (See Comments)    Myalgias    Pravachol [Pravastatin] Other (See Comments)    myalgias   Penicillins Itching    Has patient had a PCN reaction causing immediate rash, facial/tongue/throat swelling, SOB or lightheadedness with hypotension: No Has patient had a PCN reaction causing severe rash involving mucus membranes or skin necrosis: No Has patient had a PCN reaction that required hospitalization No Has patient had a PCN reaction occurring within the last 10 years: No If all of the above answers are "NO", then may proceed with Cephalosporin use.     Patient Measurements: Height: 5' 2"  (157.5 cm) Weight: 77.7 kg (171 lb 4.8 oz) IBW/kg (Calculated) : 50.1 Heparin Dosing Weight: 67 kg  Vital Signs: Temp: 98.4 F (36.9 C) (06/19 0745) Temp Source: Oral (06/19 0745) BP: 99/56 (06/19 0430) Pulse Rate: 82 (06/19 0800)  Labs: Recent Labs    05/01/2021 1527 04/20/2021 1744 04/20/21 0248 04/20/21 2242 04/21/21 0430 04/21/21 1522 04/22/21 0401  HGB 10.9*  --    < > 9.9* 8.8* 8.4* 8.0*  HCT 31.1*  --    < > 29.6* 26.4* 26.1* 25.5*  PLT 181  --    < > 242 219 229 214  LABPROT 24.1*  --   --   --   --   --   --   INR 2.2*  --   --   --   --   --   --   CREATININE 1.38*  --    < > 1.45* 1.85* 2.55* 3.24*  TROPONINIHS 20* 20*  --  23* 22*  --   --    < > = values in this interval not displayed.   Estimated Creatinine Clearance: 13.4 mL/min (A) (by C-G formula based on SCr of 3.24 mg/dL (H)).  Medications:  Scheduled:   chlorhexidine gluconate (MEDLINE KIT)  15 mL Mouth Rinse BID   Chlorhexidine Gluconate Cloth  6 each Topical Daily   docusate  100 mg Per Tube BID   fentaNYL (SUBLIMAZE)  injection  25 mcg Intravenous Once   free water  30 mL Per Tube Q4H   hydrocortisone sod succinate (SOLU-CORTEF) inj  50 mg Intravenous BID   insulin aspart  0-15 Units Subcutaneous Q4H   ipratropium  0.5 mg Nebulization Q6H   levalbuterol  0.63 mg Nebulization Q6H   mouth rinse  15 mL Mouth Rinse 10 times per day   multivitamin  15 mL Per Tube Daily   pantoprazole (PROTONIX) IV  40 mg Intravenous Q12H   polyethylene glycol  17 g Per Tube Daily   sodium chloride flush  10-40 mL Intracatheter Q12H   Infusions:   sodium chloride     sodium chloride 10 mL/hr at 04/22/21 0800   ceFEPime (MAXIPIME) IV Stopped (04/21/21 1216)   feeding supplement (VITAL HIGH PROTEIN) 40 mL/hr at 04/22/21 0600   fentaNYL infusion INTRAVENOUS 25 mcg/hr (04/22/21 0800)   heparin     midazolam Stopped (04/22/21 0721)   phenylephrine (NEO-SYNEPHRINE) Adult infusion 40 mcg/min (04/22/21 0800)   vasopressin 0.03 Units/min (04/22/21 0800)    Assessment: 5 yoF admit 6/16 with ShOB, DOE, recurrent falls, BLE edema. Afib on admit to ED from PCP, RVR PMH:  Afib on Apixaban, HTN, GCA/Pred, DM2  Renal fx worsening, Apixaban 26m bid resumed on admit > reduced to 2.510mbid 6/18 > Heparin infusion 6/19 DOAC will falsely elevated Heparin levels, will monitor aPTT and Heparin levels - till they correlate.  Goal of Therapy:  Heparin level 0.3-0.7 units/ml aPTT 66-102 seconds Monitor platelets by anticoagulation protocol: Yes   Plan:  Baseline aPTT and Heparin level Begin Heparin infusion at 75038mnits/hr Check 8 hr aPTT, Hep level, daily CBC  GreMinda DittoarmD 04/22/2021,10:13 AM

## 2021-04-22 NOTE — Progress Notes (Signed)
RT called [facility] and talked to nurse Jody about panic blood gas. Stat chest xray per verbal order.RN aware

## 2021-04-22 NOTE — Progress Notes (Signed)
[facility] informed that patient urine output is 30 ml since 1900.

## 2021-04-22 NOTE — Progress Notes (Signed)
[facility] Progress Note Patient Name: Leslie Bryant DOB: [birth year] MRN: [id number]   Date of Service  04/22/2021  HPI/Events of Note  Notified of ABG 7.16/59/106 On 35/300/80%%/14 PEEP Peak pressure 29  eICU Interventions  Increase TV to 350 cc RT to check plateau pressure/driving pressure     Intervention Category Intermediate Interventions: Other:  Judd Lien 04/22/2021, 3:59 AM

## 2021-04-22 NOTE — Consult Note (Signed)
Renal Service Consult Note [facility]  Leslie Bryant 04/22/2021 Sol Blazing, MD Requesting Physician: Dr. Lynford Citizen  Reason for Consult: Renal failure HPI: The patient is a 80 y.o. year-old w/ hx of HTN, afib, remote breast cancer, HL, PNA, knee replacement surgery bilat, hx GCA (temporal arteritis) came to ED per EMS for falls at home, gen'd weakness in the legs. In ED CXR showed bibasilar infiltrates, and pt was hypoxic, covid was negative and she was admitted for CAP and started on IV abx. Also has some edema/ fluid retention and afib / RVR and rec'd cardizem gtt. Creat was 1.16 on admit 6.17, up to 1.85 yest and 3.24 today.    Pt got LR bolus 1 liter on admission and 500 cc x2 yesterday. Pt rec'd IV lasix 40 day of admit and 41m x 1 yesterday.   [place] declined from resp standpoint [place] to [place] later on 6/17.  BP's dropped and pressors were started early 6/18 w/ phenylephrine 1st then vaso gtt added the same day.   Pt made 750 cc UOP on 6/17 , 300 cc yest and 50 cc today so far. Total I/O are 3.7 L +.  Wt's are up 2kg if the 1st wt is excluded.     ROS on vent, no ROS obtained.    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  Resolved by CT scan Recurrent episode clinical dx 07/2012    Diverticulosis of colon    sigmoid   GERD (gastroesophageal reflux disease)    H/O hiatal hernia    Headache    History of kidney stones    "passed it" (02/19/2017)   Hypercholesterolemia    history   Left knee pain    occasionally   Palpitations IRREGULAR HEARTBEAT--   CONTROLLED W/  BETA BLOCKER   Pneumonia 2017   "walking pneumonia" (02/19/2017)   Swelling of left knee joint    Past Surgical History  Past Surgical History:  Procedure Laterality Date   ARTERY BIOPSY Left 01/16/2021   Procedure: BIOPSY TEMPORAL ARTERY;  Surgeon: TLeta Baptist MD;  [place]  Service: ENT;  Laterality: Left;   BREAST BIOPSY Right 1986   BREAST BIOPSY Left    BREAST RECONSTRUCTION Right 1987   W/ IMPLANTS   CARDIAC CATHETERIZATION  12-22-2000  DR BRACKBILL   NORMAL LVF/ NORMAL CORONARY ARTERIES   CARDIOVERSION N/A 03/23/2020   Procedure: CARDIOVERSION;  Surgeon: SDonato Heinz MD;  [facility]  Service: Cardiovascular;  Laterality: N/A;   CARDIOVERSION N/A 07/20/2020   Procedure: CARDIOVERSION;  Surgeon: CBuford Dresser MD;  [place]  Service: Cardiovascular;  Laterality: N/A;   [place]  CHONDROP[place]Y  09/08/2012   Procedure: CHONDROPLASTY;  Surgeon: RTobi Bastos MD;  [place]  Service: Orthopedics;  Laterality: Left;   COLONOSCOPY     last colon 05-03-2009 (02/19/2017)   COLONOSCOPY W/ BIOPSIES AND POLYPECTOMY     KNEE ARTHROSCOPY WITH MEDIAL MENISECTOMY  09/08/2012  Procedure: KNEE ARTHROSCOPY WITH MEDIAL MENISECTOMY;  Surgeon: Tobi Bastos, MD;  Location: [place];  Service: Orthopedics;  Laterality: Left;  lateral tibial plateau,    MASTECTOMY Right 1986   W/ MULTIPLE NODE DISSECTION   REDUCTION MAMMAPLASTY Left 1987   TOTAL KNEE ARTHROPLASTY Left 07/21/2013   Procedure: TOTAL KNEE ARTHROPLASTY;  Surgeon: Newt Minion, MD;  Location: [place];  Service: Orthopedics;  Laterality: Left;  Left Total Knee Arthroplasty   TOTAL KNEE ARTHROPLASTY Right 02/19/2017   Procedure: RIGHT TOTAL KNEE ARTHROPLASTY;  Surgeon: Newt Minion, MD;  Location: [place];  Service: Orthopedics;  Laterality: Right;   VAGINAL HYSTERECTOMY  1966   Family  History  Family History  Problem Relation Age of Onset   Heart attack Father    Heart failure Mother        CHF   Dementia Maternal Grandfather    Dementia Sister    Colon cancer Brother        died/age 81   Esophageal cancer Brother    Lung cancer Sister    Brain cancer Sister    Cancer Brother        mouth   Rectal cancer Brother    Leukemia Brother    Cancer Sister        type unknown   Cancer Sister        type unknown   Heart disease Daughter    Stomach cancer Neg Hx    Social History  reports that she has never smoked. She has never used smokeless tobacco. She reports previous alcohol use of about 1.0 standard drink of alcohol per week. She reports that she does not use drugs. Allergies  Allergies  Allergen Reactions   Crestor [Rosuvastatin Calcium] Other (See Comments)    Shoulder pain   Crestor [Rosuvastatin]     Other reaction(s): Unknown   Lipitor [Atorvastatin Calcium] Other (See Comments)    Myalgias    Pravachol [Pravastatin] Other (See Comments)    myalgias   Penicillins Itching    Has patient had a PCN reaction causing immediate rash, facial/tongue/throat swelling, SOB or lightheadedness with hypotension: No Has patient had a PCN reaction causing severe rash involving mucus membranes or skin necrosis: No Has patient had a PCN reaction that required hospitalization No Has patient had a PCN reaction occurring within the last 10 years: No If all of the above answers are "NO", then may proceed with Cephalosporin use.    Home medications Prior to Admission medications   Medication Sig Start Date End Date Taking? Authorizing Provider  alendronate (FOSAMAX) 70 MG tablet Take 70 mg by mouth once a week. 01/30/21  Yes [provider]  amiodarone (PACERONE) 200 MG tablet Take 1 tablet (200 mg total) by mouth daily. 06/19/20  Yes Deboraha Sprang, MD  calcium carbonate (OS-CAL - DOSED IN MG OF ELEMENTAL CALCIUM) 1250 (500 Ca) MG tablet Take 1 tablet by  mouth daily with breakfast.   Yes [provider]  co-enzyme Q-10 30 MG capsule Take 30 mg by mouth daily.   Yes [provider]  ELIQUIS 5 MG TABS tablet Take 1 tablet by mouth twice daily Patient taking differently: Take 5 mg by mouth 2 (two) times daily. 02/12/21  Yes Hilty, Nadean Corwin, MD  ezetimibe (ZETIA) 10 MG tablet Take 1 tablet by mouth once daily Patient taking differently: Take 10 mg by mouth daily. 02/01/21  Yes Deboraha Sprang, MD  famotidine (PEPCID) 40  MG tablet TAKE 1 TABLET BY MOUTH AT BEDTIME Patient taking differently: Take 40 mg by mouth daily. 03/22/21  Yes Biagio Borg, MD  furosemide (LASIX) 40 MG tablet Take 1 tablet by mouth once daily Patient taking differently: Take 40 mg by mouth daily. 03/05/21  Yes Hilty, Nadean Corwin, MD  metoprolol tartrate (LOPRESSOR) 50 MG tablet TAKE 1 & 1/2 (ONE & ONE-HALF) TABLETS BY MOUTH TWICE DAILY Patient taking differently: Take 75 mg by mouth 2 (two) times daily. 11/29/20  Yes Deboraha Sprang, MD  Multiple Vitamins-Minerals (CENTRUM SILVER PO) Take 1 tablet by mouth every morning.   Yes [provider]  omeprazole (PRILOSEC) 40 MG capsule TAKE 1 CAPSULE BY MOUTH TWICE DAILY BEFORE MEAL(S) Patient taking differently: Take 40 mg by mouth daily. 08/01/20  Yes Almyra Deforest, PA  predniSONE (DELTASONE) 10 MG tablet Take 10 mg by mouth 2 (two) times daily with a meal. 04/10/21  Yes [provider]  ACTEMRA 162 MG/0.9ML SOSY Inject 162 mg into the skin every 14 (fourteen) days. 04/06/21   [provider]  hyoscyamine (LEVSIN SL) 0.125 MG SL tablet DISSOLVE 1 TABLET IN MOUTH THREE TIMES DAILY AS NEEDED FOR ABDOMINAL CRAMPS 12/22/20 04/10/2021  Gatha Mayer, MD  ipratropium (ATROVENT) 0.06 % nasal spray as needed. 11/07/20 04/18/2021  [provider]  zolpidem (AMBIEN) 5 MG tablet 1-2 tab by mouth at bedtime as needed 02/20/21 04/13/2021  Biagio Borg, MD     Vitals:   04/22/21 4008 04/22/21 0745 04/22/21 0800  04/22/21 1121  BP:      Pulse:   82   Resp:   (!) 35   Temp:  98.4 F (36.9 C)    TempSrc:  Oral    SpO2: 98% 98% 99% 100%  Weight:      Height:       Exam Gen on vent, sedated, NG tube feeds running No rash, cyanosis or gangrene Sclera anicteric, throat w/ ETT No jvd or bruits, flat neck veins Chest clear anterior/ lateral RRR no MRG Abd soft ntnd no mass or ascites +bs GU w/ foley in place, clear yellow urine in bag MS no joint effusions or deformity Ext 1-2+ bilat pretib edema, 1+ bilat UE edema Neuro is on vent, sedated       UA 6/16   prot 30, 0-5 rbc, 6-10 wbc    UNa 29 on 6/17     Baseline creat from 2021 is 0.8- 1.1, eGFR 46- 71    Renal US - 9.5 and 11 cm kidneys, no hydro       Na 135 k 4.4 CO2 23  BUN 51   cr 3.24  CA 7.5  Mg 3.1  Alb 1.8      Hb 8.0  wBC 19K    CXR started w bibasilar infiltrates and worsening to severe R > L bilat infiltrates     Assessment/ Plan: AKI - baseline creat from 2021 is 0.8- 1.1, eGFR 46- 71. Creat here 1.0 on admission, up to mid 3's today w/ rapidly declining UOP due to severe shock / renal hypoperfusion. No indication for RRT at this time, acid-base and K+ labs are stable. Cont supportive care, Rx of shock, wean pressors as tolerated. Will follow.   Severe ARDS/ resp failure-  due to sepsis, per CCM Shock d/t sepsis- initially felt to be PNA but UCx showed +Klebs pna, on IV cefepime now, and is getting pressor support Volume - up 4 L by  I/O, and up 2-3kg by wt's, mild-mod edema of extremities. CXR w/ focal infiltrates not c/w edema.  Atrial fib - cardizem dc'd w/ onset of shock, HR's 80-90's now Chronic steroid use - for GCA, she is immunosuppressed DNR - pt was made DNR 6/18      Kelly Splinter  MD 04/22/2021, 12:22 PM  Recent Labs  Lab 04/21/21 1522 04/22/21 0401  WBC 20.6* 19.4*  HGB 8.4* 8.0*   Recent Labs  Lab 04/21/21 0430 04/21/21 1522 04/22/21 0401  K 4.1 4.3 4.4  BUN 32* 42* 51*  CREATININE 1.85* 2.55*  3.24*  CALCIUM 7.5* 7.5* 7.5*  PHOS 4.2  --  6.2*

## 2021-04-23 ENCOUNTER — Inpatient Hospital Stay (HOSPITAL_COMMUNITY): Payer: Medicare Other

## 2021-04-23 DIAGNOSIS — N179 Acute kidney failure, unspecified: Secondary | ICD-10-CM | POA: Diagnosis not present

## 2021-04-23 DIAGNOSIS — A419 Sepsis, unspecified organism: Secondary | ICD-10-CM | POA: Diagnosis not present

## 2021-04-23 DIAGNOSIS — J80 Acute respiratory distress syndrome: Secondary | ICD-10-CM | POA: Diagnosis not present

## 2021-04-23 LAB — COMPREHENSIVE METABOLIC PANEL
ALT: 37 U/L (ref 0–44)
AST: 32 U/L (ref 15–41)
Albumin: 1.8 g/dL — ABNORMAL LOW (ref 3.5–5.0)
Alkaline Phosphatase: 86 U/L (ref 38–126)
Anion gap: 12 (ref 5–15)
BUN: 78 mg/dL — ABNORMAL HIGH (ref 8–23)
CO2: 21 mmol/L — ABNORMAL LOW (ref 22–32)
Calcium: 7.7 mg/dL — ABNORMAL LOW (ref 8.9–10.3)
Chloride: 100 mmol/L (ref 98–111)
Creatinine, Ser: 4.56 mg/dL — ABNORMAL HIGH (ref 0.44–1.00)
GFR, Estimated: 9 mL/min — ABNORMAL LOW (ref >60)
Glucose, Bld: 285 mg/dL — ABNORMAL HIGH (ref 70–99)
Potassium: 4.4 mmol/L (ref 3.5–5.1)
Sodium: 133 mmol/L — ABNORMAL LOW (ref 135–145)
Total Bilirubin: 0.6 mg/dL (ref 0.3–1.2)
Total Protein: 6.2 g/dL — ABNORMAL LOW (ref 6.5–8.1)

## 2021-04-23 LAB — CBC
HCT: 24 % — ABNORMAL LOW (ref 36.0–46.0)
Hemoglobin: 7.7 g/dL — ABNORMAL LOW (ref 12.0–15.0)
MCH: 32.4 pg (ref 26.0–34.0)
MCHC: 32.1 g/dL (ref 30.0–36.0)
MCV: 100.8 fL — ABNORMAL HIGH (ref 80.0–100.0)
Platelets: 189 10*3/uL (ref 150–400)
RBC: 2.38 MIL/uL — ABNORMAL LOW (ref 3.87–5.11)
RDW: 15.9 % — ABNORMAL HIGH (ref 11.5–15.5)
WBC: 15.1 10*3/uL — ABNORMAL HIGH (ref 4.0–10.5)
nRBC: 1.6 % — ABNORMAL HIGH (ref 0.0–0.2)

## 2021-04-23 LAB — RHEUMATOID FACTOR: Rheumatoid fact SerPl-aCnc: 16 IU/mL — ABNORMAL HIGH (ref <14.0)

## 2021-04-23 LAB — BASIC METABOLIC PANEL
Anion gap: 11 (ref 5–15)
BUN: 81 mg/dL — ABNORMAL HIGH (ref 8–23)
CO2: 23 mmol/L (ref 22–32)
Calcium: 7.8 mg/dL — ABNORMAL LOW (ref 8.9–10.3)
Chloride: 101 mmol/L (ref 98–111)
Creatinine, Ser: 4.78 mg/dL — ABNORMAL HIGH (ref 0.44–1.00)
GFR, Estimated: 9 mL/min — ABNORMAL LOW (ref >60)
Glucose, Bld: 267 mg/dL — ABNORMAL HIGH (ref 70–99)
Potassium: 4.3 mmol/L (ref 3.5–5.1)
Sodium: 135 mmol/L (ref 135–145)

## 2021-04-23 LAB — GLUCOSE, CAPILLARY
Glucose-Capillary: 230 mg/dL — ABNORMAL HIGH (ref 70–99)
Glucose-Capillary: 232 mg/dL — ABNORMAL HIGH (ref 70–99)
Glucose-Capillary: 247 mg/dL — ABNORMAL HIGH (ref 70–99)
Glucose-Capillary: 247 mg/dL — ABNORMAL HIGH (ref 70–99)
Glucose-Capillary: 253 mg/dL — ABNORMAL HIGH (ref 70–99)
Glucose-Capillary: 283 mg/dL — ABNORMAL HIGH (ref 70–99)

## 2021-04-23 LAB — PATHOLOGIST SMEAR REVIEW

## 2021-04-23 LAB — LEGIONELLA PNEUMOPHILA SEROGP 1 UR AG: L. pneumophila Serogp 1 Ur Ag: NEGATIVE

## 2021-04-23 LAB — GLUCOSE 6 PHOSPHATE DEHYDROGENASE
G6PDH: 12.6 U/g{Hb} (ref 4.8–15.7)
Hemoglobin: 7.7 g/dL — ABNORMAL LOW (ref 11.1–15.9)

## 2021-04-23 LAB — HEMOGLOBIN A1C
Hgb A1c MFr Bld: 9.1 % — ABNORMAL HIGH (ref 4.8–5.6)
Mean Plasma Glucose: 214 mg/dL

## 2021-04-23 LAB — GLOMERULAR BASEMENT MEMBRANE ANTIBODIES: GBM Ab: 3 units (ref 0–20)

## 2021-04-23 LAB — APTT: aPTT: 71 seconds — ABNORMAL HIGH (ref 24–36)

## 2021-04-23 LAB — PHOSPHORUS: Phosphorus: 5.5 mg/dL — ABNORMAL HIGH (ref 2.5–4.6)

## 2021-04-23 MED ORDER — INSULIN ASPART 100 UNIT/ML IJ SOLN
3.0000 [IU] | INTRAMUSCULAR | Status: DC
  Administered 2021-04-23 – 2021-04-24 (×6): 3 [IU] via SUBCUTANEOUS

## 2021-04-23 MED ORDER — SODIUM CHLORIDE 0.9 % IV SOLN
1.0000 g | INTRAVENOUS | Status: DC
  Administered 2021-04-23: 1 g via INTRAVENOUS
  Filled 2021-04-23 (×2): qty 1

## 2021-04-23 MED ORDER — INSULIN GLARGINE 100 UNIT/ML ~~LOC~~ SOLN
10.0000 [IU] | Freq: Every day | SUBCUTANEOUS | Status: DC
  Administered 2021-04-23 – 2021-05-01 (×9): 10 [IU] via SUBCUTANEOUS
  Filled 2021-04-23 (×9): qty 0.1

## 2021-04-23 NOTE — Progress Notes (Signed)
Admit: 04/06/2021 LOS: 4  37F nl GFR with anuric AKI in setting of CAP/ARDS/VDRF, shock now off pressors, hx/o giant cell arteritis  Subjective:  Off pressors Daughter, Son, and Husband at bedside ANuric UOP < 100 SCr cont inc, K 4.3, BN 81, HCO3 23  06/19 0701 - 06/20 0700 In: 1910.3 [I.V.:532.6; NG/GT:1277.7; IV Piggyback:100] Out: 89 [Urine:89]  Filed Weights   04/05/2021 2030 04/20/21 0600 04/21/21 0216  Weight: 75.7 kg 74.7 kg 77.7 kg    Scheduled Meds:  chlorhexidine gluconate (MEDLINE KIT)  15 mL Mouth Rinse BID   Chlorhexidine Gluconate Cloth  6 each Topical Daily   docusate  100 mg Per Tube BID   fentaNYL (SUBLIMAZE) injection  25 mcg Intravenous Once   free water  30 mL Per Tube Q4H   hydrocortisone sod succinate (SOLU-CORTEF) inj  50 mg Intravenous BID   insulin aspart  0-15 Units Subcutaneous Q4H   insulin aspart  3 Units Subcutaneous Q4H   insulin glargine  10 Units Subcutaneous Daily   ipratropium  0.5 mg Nebulization Q6H   levalbuterol  0.63 mg Nebulization Q6H   mouth rinse  15 mL Mouth Rinse 10 times per day   multivitamin  15 mL Per Tube Daily   pantoprazole (PROTONIX) IV  40 mg Intravenous Q12H   polyethylene glycol  17 g Per Tube Daily   sodium chloride flush  10-40 mL Intracatheter Q12H   Continuous Infusions:  sodium chloride     sodium chloride 10 mL/hr at 04/23/21 0614   ceFEPime (MAXIPIME) IV     feeding supplement (VITAL HIGH PROTEIN) 1,000 mL (04/22/21 2236)   fentaNYL infusion INTRAVENOUS 50 mcg/hr (04/23/21 0614)   heparin 750 Units/hr (04/23/21 1245)   midazolam Stopped (04/22/21 0721)   phenylephrine (NEO-SYNEPHRINE) Adult infusion Stopped (04/22/21 1231)   vasopressin Stopped (04/22/21 1330)   PRN Meds:.Place/Maintain arterial line **AND** sodium chloride, acetaminophen, acetaminophen, fentaNYL, midazolam, rocuronium bromide, sodium chloride flush  Current Labs: reviewed  6/16 UA w/o hematuria, trace protein 6/19 Renal US no  HN  Physical Exam:  Blood pressure 139/63, pulse (!) 121, temperature 98.5 F (36.9 C), temperature source Oral, resp. rate 20, height 5' 2"  (1.575 m), weight 77.7 kg, SpO2 93 %. Intubated sedated Tachy IRIR Coarse bs b/l Trace LEE S/nt/nd No rashes/lesions ETT in place, NCAT  A Anuric AKI, likely ATN in setting of #2 and #3. No current indication to intiate RRT but very possible in next 24h to 72h ARDS/CAP/VDRF: per CCM Septic Shock, improving off pressors Giant Cell arteritis, chronic IS, now on stress dosing Anemia, transfusion per primary [name] UTI: ABX per CCM AFib Hypervolemia  P Cont supportive care Daily assessment for RRT needs, prob would keep her here at [facility] and do CRRT if necessary Daily weights, Daily Renal Panel, Strict I/Os, Avoid nephrotoxins (NSAIDs, judicious IV Contrast)  Will follow closely  Pearson Grippe MD 04/23/2021, 2:06 PM  Recent Labs  Lab 04/21/21 0430 04/21/21 1522 04/22/21 0401 04/23/21 0452 04/23/21 1031  NA 133*   < > 135 133* 135  K 4.1   < > 4.4 4.4 4.3  CL 99   < > 103 100 101  CO2 24   < > 23 21* 23  GLUCOSE 217*   < > 213* 285* 267*  BUN 32*   < > 51* 78* 81*  CREATININE 1.85*   < > 3.24* 4.56* 4.78*  CALCIUM 7.5*   < > 7.5* 7.7* 7.8*  PHOS 4.2  --  6.2* 5.5*  --    < > = values in this interval not displayed.   Recent Labs  Lab 04/26/2021 1527 04/20/21 0248 04/21/21 1522 04/22/21 0401 04/22/21 0927 04/23/21 0035  WBC 15.7*   < > 20.6* 19.4*  --  15.1*  NEUTROABS 14.8*  --   --   --   --   --   HGB 10.9*   < > 8.4* 8.0* 7.7* 7.7*  HCT 31.1*   < > 26.1* 25.5*  --  24.0*  MCV 92.3   < > 99.6 102.4*  --  100.8*  PLT 181   < > 229 214  --  189   < > = values in this interval not displayed.

## 2021-04-23 NOTE — Progress Notes (Signed)
Inpatient Diabetes Program Recommendations  AACE/ADA: New Consensus Statement on Inpatient Glycemic Control (2015)  Target Ranges:  Prepandial:   less than 140 mg/dL      Peak postprandial:   less than 180 mg/dL (1-2 hours)      Critically ill patients:  140 - 180 mg/dL   Results for Leslie Bryant, Leslie Bryant (MRN [id number]) as of 04/23/2021 07:42  Ref. Range 04/21/2021 23:46 04/22/2021 03:32 04/22/2021 08:19 04/22/2021 13:08 04/22/2021 16:09 04/22/2021 19:42 04/22/2021 22:24  Glucose-Capillary Latest Ref Range: 70 - 99 mg/dL 208 (H) 206 (H) 185 (H) 237 (H) 240 (H) 205 (H) 225 (H)   Results for Leslie Bryant, Leslie Bryant (MRN [id number]) as of 04/23/2021 09:20  Ref. Range 04/22/2021 23:40 04/23/2021 03:51 04/23/2021 08:39  Glucose-Capillary Latest Ref Range: 70 - 99 mg/dL 274 (H)  8 units NOVOLOG  253 (H)  8 units NOVOLOG  247 (H)     Home DM Meds: None Listed  Current Orders: Novolog Moderate Correction Scale/ SSI (0-15 units) Q4 hours    Patient remains on Solucortef 50 mg BID  Remains on Vent--Getting Tube Feeds 50cc/hr    MD- Note CBGs remain >200  Please consider:  1. Start Lantus 6 units BID (0.15 units/kg)  2. Start low dose Novolog Tube Feed Coverage: Novolog 3 units Q4 hours HOLD dose if tube feeds HELD for any reason     --Will follow patient during hospitalization--  Wyn Quaker RN, MSN, CDE Diabetes Coordinator Inpatient Glycemic Control Team Team Pager: [phone] (8a-5p)

## 2021-04-23 NOTE — Progress Notes (Signed)
NAME:  Leslie Bryant MRN:  [id number] DOB:  [birth year] LOS: 4 ADMISSION DATE:  04/22/2021 CONSULTATION DATE: 04/20/2021 REFERRING MD:  Alfredia Ferguson - TRH CHIEF COMPLAINT:  SOB, hypoxic respiratory failure   Brief Narrative   80 year old female presented with presented to [facility] 6/16 via EMS for worsening SOB, hypoxia and concern for PNA.On ED evaluation patient was hypoxic on RA and in A-fib RVR .CXR demonsrated bilateral lower lobe infiltrates. Labs were notable for Na 123, BNP 467, Trop 20 and WBC 15K. Patient was admitted to [facility].  PCCM consulted 6/17 for ongoing hypoxemic respiratory failure despite supplemental O2.  Pertinent Medical History:  A-fib Right breast cancer  Bowel perforation  HLD  Significant Hospital Events: Including procedures, antibiotic start and stop dates in addition to other pertinent events   6/16 - Admitted to [facility] for SOB, hypoxia, multifocal PNA 6/17 - PCCM consulted for persistent hypoxia, mild lethargy, ICU admission -> intubated later in day. CT chest with bilateral diffuse GGO.  Failed PICC line insertion 6/18 AM - worsening AKI - creat 1.8. on vent 80% fio2, supnine. Sedated with fent gtt, versed gtt. On pressors neo. PCT high but culture negative so far. HS trop in 20s. RASS -4. On protoni and coffee ground mild returns noted this morning 6/18 PM - -in renal failure. Worsening creat. Dropping Ur op despite fluids. Bronch BAL -> 86% polys.  Alv  hge ruled out.  Elliquis now lower dose. Code changed to DNR + full medical care 6/20 Remains sedated on vent minimally responsive on fentanyl drip with worsening renal failure, now off pressors  Interim History / Subjective:  No acute events overnight Renal function continues to decline with oliguria despite Lasix challenge +5 L  Objective:  Blood pressure (!) 126/58, pulse (!) 104, temperature 98.4 F (36.9 C), temperature source Axillary, resp. rate 19, height _0  (1.575 m), weight 77.7 kg, SpO2 95 %.    Vent Mode:  PRVC FiO2 (%):  [60 %-70 %] 60 % Set Rate:  [35 bmp] 35 bmp Vt Set:  [350 mL] 350 mL PEEP:  [12 cmH20-14 cmH20] 12 cmH20 Plateau Pressure:  [26 cmH20-31 cmH20] 26 cmH20   Intake/Output Summary (Last 24 hours) at 04/23/2021 0817 Last data filed at 04/23/2021 8527 Gross per 24 hour  Intake 1858.35 ml  Output 79 ml  Net 1779.35 ml    Filed Weights   05/03/2021 2030 04/20/21 0600 04/21/21 0216  Weight: 75.7 kg 74.7 kg 77.7 kg    Physical Exam General: Acute on chronic ill-appearing elderly female lying in bed on mechanical ventilation in no acute distress HEENT: ETT, MM pink/moist, PERRL,  Neuro: Sedated on vent flicker of eyelid movement to painful stimuli CV: s1s2 regular rate and rhythm, no murmur, rubs, or gallops,  PULM: Clear to auscultation bilaterally, no increased work of breathing, no added breath sounds GI: soft, bowel sounds active in all 4 quadrants, non-tender, non-distended, tolerating TF Extremities: warm/dry, 1+ generalized pitting edema  Skin: no rashes or lesions  Labs/imaging that I have personally reviewed:  ECHO - RV moderately enlarged and some reduction in RVEF (baseline eliquis for A Fib). 6/20 Pertinent labs: glucose 285, creatinine 4.56, BUN 78, phos 5.5, albumin 1.8, WBC 15.1,   Resolved Hospital Problem List:   Septic shock -Pressors stopped 6/19  Assessment & Plan:  Severe Acute Respiratory Failure due to ARDS -deterioration 6/17 resulting in intubation P: Continue ventilator support with lung protective strategies  Unable to tolerate weaning secondary to poor mentation  Wean PEEP and FiO2 for sats greater than 90%. Head of bed elevated 30 degrees. Plateau pressures less than 30 cm H20.  Follow intermittent chest x-ray and ABG.   Ensure adequate pulmonary hygiene  Follow cultures  VAP bundle in place  PAD protocol RASS goal 0 to -1  Acute metabolic encephalopathy Hx of falls with "Battle Eyes" prior to admission  Baseline hx of Giant Cell  Arteritis on chronic steroids - CT head 04/16/21 - negative P:   Maintain neuro protective measures; goal for eurothermia, euglycemia, eunatermia, normoxia, and PCO2 goal of 35-40 Nutrition and bowel regiment  Seizure precautions  Aspirations precautions  Minimize sedation   Klebsiella UTI at admit  Baseline immunosuppressed due to chronic steroids P:   Remain on IV Cefepime  Continue steroids Pressors stopped 6/19 BAL studies pending   Mild new onset RV failure at admit 04/20/21.  -Unlikely PE due to baseline eliquis -EF 55-60% with moderate reduce RV function  Hx of A Fib on amio  -on eliquis (amio held at admit, cardizem stopped 6/17 pre intubation) P: Strict intake and output  Daily weight  Optimize electrolytes Monitor volume status closely Continuous telemetry  AKI POA now with progression to oliguric renal failure  -Baseline creat 0.74m 04/16/21, creatinine now uptrended to 4.56, GFR 9 -Renal [place]egative P: Nephrology following  Follow renal function Monitor urine output Trend Bmet Avoid nephrotoxins Ensure adequate renal perfusion   Moderate hyponatremia P: Trend Bmet  Supplement as needed   Baesline mild anemia prior to admit  -No alveolar hemorrhage on bronchoscopy 04/21/2021 P: Trend CBC  Monitor for signs of bleeding Transfuse per protocol   Best Practice   Diet/type: tubefeeds Pain/Anxiety/Delirium protocol RASS goal: 0 to -1 VAP protocol (if indicated): Yes DVT prophylaxis: systemic heparin GI prophylaxis: PPI Glucose control:  SSI - increased due to poor glycemic control  Central venous access:  Yes, and it is no longer needed and removal ordered  Arterial line:  Yes, and it is no longer needed and removal ordered  Foley:  Yes, and it is still needed Mobility:  bed rest  PT consulted: N/A Studies pending: None Culture data pending:none Last reviewed culture data:today Antibiotics:cefepime Antibiotic de-escalation: no,  continue current  rx Stop date: ordered Daily labs: ordered Code Status:  DNR Last date of multidisciplinary goals of care discussion:Update family daily,  ccm prognosis: Life-threating Disposition: remains critically ill, will stay in intensive care   Critical care time:    Performed by: Hendel Gatliff D. Harris  Total critical care time: 40 minutes  Critical care time was exclusive of separately billable procedures and treating other patients.  Critical care was necessary to treat or prevent imminent or life-threatening deterioration.  Critical care was time spent personally by me on the following activities: development of treatment plan with patient and/or surrogate as well as nursing, discussions with consultants, evaluation of patient's response to treatment, examination of patient, obtaining history from patient or surrogate, ordering and performing treatments and interventions, ordering and review of laboratory studies, ordering and review of radiographic studies, pulse oximetry and re-evaluation of patient's condition.  WJohnsie Cancel NP-C [facility] Personal contact information can be found on Amion  04/23/2021, 8:47 AM

## 2021-04-23 NOTE — Progress Notes (Signed)
ANTICOAGULATION CONSULT NOTE -follow up  Pharmacy Consult for Heparin Indication: atrial fibrillation  Allergies  Allergen Reactions   Crestor [Rosuvastatin Calcium] Other (See Comments)    Shoulder pain   Crestor [Rosuvastatin]     Other reaction(s): Unknown   Lipitor [Atorvastatin Calcium] Other (See Comments)    Myalgias    Pravachol [Pravastatin] Other (See Comments)    myalgias   Penicillins Itching    Has patient had a PCN reaction causing immediate rash, facial/tongue/throat swelling, SOB or lightheadedness with hypotension: No Has patient had a PCN reaction causing severe rash involving mucus membranes or skin necrosis: No Has patient had a PCN reaction that required hospitalization No Has patient had a PCN reaction occurring within the last 10 years: No If all of the above answers are "NO", then may proceed with Cephalosporin use.     Patient Measurements: Height: 5' 2"  (157.5 cm) Weight: 77.7 kg (171 lb 4.8 oz) IBW/kg (Calculated) : 50.1 Heparin Dosing Weight: 67 kg  Vital Signs: Temp: 98.4 F (36.9 C) (06/20 0400) Temp Source: Axillary (06/20 0400) BP: 132/59 (06/20 0200) Pulse Rate: 104 (06/20 0245)  Labs: Recent Labs    04/20/21 2242 04/21/21 0430 04/21/21 0916 04/21/21 1522 04/22/21 0401 04/22/21 1031 04/22/21 2000 04/23/21 0035 04/23/21 0400 04/23/21 0452  HGB 9.9* 8.8*   < > 8.4* 8.0*  --   --  7.7*  --   --   HCT 29.6* 26.4*  --  26.1* 25.5*  --   --  24.0*  --   --   PLT 242 219  --  229 214  --   --  189  --   --   APTT  --   --   --   --   --  39* 79*  --  71*  --   HEPARINUNFRC  --   --   --   --   --  >1.10* >1.10*  --   --   --   CREATININE 1.45* 1.85*  --  2.55* 3.24*  --   --   --   --  4.56*  TROPONINIHS 23* 22*  --   --   --   --   --   --   --   --    < > = values in this interval not displayed.    Estimated Creatinine Clearance: 9.5 mL/min (A) (by C-G formula based on SCr of 4.56 mg/dL (H)).  Medications:  Scheduled:    chlorhexidine gluconate (MEDLINE KIT)  15 mL Mouth Rinse BID   Chlorhexidine Gluconate Cloth  6 each Topical Daily   docusate  100 mg Per Tube BID   fentaNYL (SUBLIMAZE) injection  25 mcg Intravenous Once   free water  30 mL Per Tube Q4H   hydrocortisone sod succinate (SOLU-CORTEF) inj  50 mg Intravenous BID   insulin aspart  0-15 Units Subcutaneous Q4H   ipratropium  0.5 mg Nebulization Q6H   levalbuterol  0.63 mg Nebulization Q6H   mouth rinse  15 mL Mouth Rinse 10 times per day   multivitamin  15 mL Per Tube Daily   pantoprazole (PROTONIX) IV  40 mg Intravenous Q12H   polyethylene glycol  17 g Per Tube Daily   sodium chloride flush  10-40 mL Intracatheter Q12H   Infusions:   sodium chloride     sodium chloride 10 mL/hr at 04/23/21 0250   ceFEPime (MAXIPIME) IV Stopped (04/22/21 1152)   feeding supplement (VITAL HIGH PROTEIN)  1,000 mL (04/22/21 2236)   fentaNYL infusion INTRAVENOUS 50 mcg/hr (04/23/21 0250)   heparin 750 Units/hr (04/23/21 0250)   midazolam Stopped (04/22/21 0721)   phenylephrine (NEO-SYNEPHRINE) Adult infusion Stopped (04/22/21 1231)   vasopressin Stopped (04/22/21 1330)    Assessment: 35 yoF admit 6/16 with ShOB, DOE, recurrent falls, BLE edema. Afib on admit to ED from PCP, RVR PMH: Afib on Apixaban, HTN, GCA/Pred, DM2  Renal fx worsening, Apixaban 25m bid resumed on admit > reduced to 2.576mbid 6/18 > Heparin infusion 6/19 DOAC will falsely elevated Heparin levels, will monitor aPTT and Heparin levels - till they correlate.  04/23/2021 aPTT 71 therapeutic on 750 units/hr Hgb down to 7.7, Plts WNL Scr up to 4.56 No bleeding noted   Goal of Therapy:  Heparin level 0.3-0.7 units/ml aPTT 66-102 seconds Monitor platelets by anticoagulation protocol: Yes   Plan:  Continue Heparin infusion at 75049mnits/hr Daily aPTT, HL and CBC  EllDolly Riash 04/23/2021, 5:34 AM

## 2021-04-23 NOTE — Progress Notes (Signed)
Pharmacy Antibiotic Note  Leslie Bryant is a 80 y.o. female admitted on 04/17/2021.  Pharmacy has been consulted for Cefepime dosing for Klebsiella UTI. AKI worsening with SCr up to 4.56 Afebrile, WBC remains elevated (chronic steroids)   Plan: Decrease to Cefepime 1gm IV q24h Follow up renal function, culture results, and clinical course.    Height: 5\' 2"  (157.5 cm) Weight: 77.7 kg (171 lb 4.8 oz) IBW/kg (Calculated) : 50.1  Temp (24hrs), Avg:98.4 F (36.9 C), Min:98 F (36.7 C), Max:98.9 F (37.2 C)  Recent Labs  Lab 04/06/2021 1527 04/11/2021 1744 04/20/21 0248 04/20/21 1924 04/20/21 2242 04/21/21 0300 04/21/21 0430 04/21/21 1522 04/22/21 0401 04/23/21 0035 04/23/21 0452  WBC 15.7*  --    < >  --  21.6*  --  19.8* 20.6* 19.4* 15.1*  --   CREATININE 1.38*  --    < >  --  1.45*  --  1.85* 2.55* 3.24*  --  4.56*  LATICACIDVEN 2.5* 2.0*  --  2.4*  --  2.5*  --   --   --   --   --    < > = values in this interval not displayed.     Estimated Creatinine Clearance: 9.5 mL/min (A) (by C-G formula based on SCr of 4.56 mg/dL (H)).    Allergies  Allergen Reactions   Crestor [Rosuvastatin Calcium] Other (See Comments)    Shoulder pain   Crestor [Rosuvastatin]     Other reaction(s): Unknown   Lipitor [Atorvastatin Calcium] Other (See Comments)    Myalgias    Pravachol [Pravastatin] Other (See Comments)    myalgias   Penicillins Itching    Has patient had a PCN reaction causing immediate rash, facial/tongue/throat swelling, SOB or lightheadedness with hypotension: No Has patient had a PCN reaction causing severe rash involving mucus membranes or skin necrosis: No Has patient had a PCN reaction that required hospitalization No Has patient had a PCN reaction occurring within the last 10 years: No If all of the above answers are "NO", then may proceed with Cephalosporin use.     Antimicrobials this admission: 6/16 Azithromycin >>  6/16 Ceftriaxone >> 6/17 6/18 Cefepime  >>  Microbiology results: 6/16 BCx: sent 6/16 UCx: sent  6/18 Trach aspirate: sent  6/16 MRSA PCR: neg  Thank you for allowing pharmacy to be a part of this patient's care.  Gretta Arab PharmD, BCPS Clinical Pharmacist [facility] main pharmacy [phone] 04/23/2021 7:41 AM

## 2021-04-23 NOTE — Progress Notes (Signed)
Nutrition Follow-up  DOCUMENTATION CODES:   Obesity unspecified  INTERVENTION:  - continue Vital High Protein @ 50 ml/hr. - free water flush per CCM.    NUTRITION DIAGNOSIS:   Inadequate oral intake related to inability to eat as evidenced by NPO status. -ongoing  GOAL:   Provide needs based on ASPEN/SCCM guidelines -met with TF regimen  MONITOR:   Vent status, TF tolerance, Labs, Weight trends  REASON FOR ASSESSMENT:   Ventilator Enteral/tube feeding initiation and management  ASSESSMENT:   80 y/o female with h/o HTN, DM, Afib, HLD, GERD, IBS, breast cancer s/p mastectomy, giant cell arthritis, diverticulitis and hiatal hernia who is admitted with CAP, CHF, AKI and septic shock  Husband and two adult children at bedside. Patient remains intubated with OGT in place. She is receiving Vital High Protein @ 50 ml/hr with 30 ml free water every 4 hours which is providing 1200 kcal, 105 grams protein, and 1183 ml free water.   She has not been weighed since 6/18. Edema has progressively worsened since that date so anticipate that weight is falsely higher than previously recorded weights.   Per notes: - severe acute respiratory failure d/t ARDS - acute metabolic encephalopathy  - UTI on admission - mild new onset RV failure on admission - AKI on admission with progression to oliguric renal failure--Nephrology following   Patient is currently intubated on ventilator support MV: 10.4 L/min Temp (24hrs), Avg:98.4 F (36.9 C), Min:98 F (36.7 C), Max:98.9 F (37.2 C) Propofol: none BP: 139/63 and MAP: 84   Labs reviewed; CBGs: 253, 247, 247 mg/dl, BUN: 81 mg/dl, creatinine: 4.78 mg/dl, Ca: 7.8 mg/dl, Phos: 5.5 mg/dl, GFR: 9 ml/min. Medications reviewed; 100 mg colace BID, 80 mg IV lasix x1 dose 6/19, sliding scale novolog, 50 mg solu-cortef BID, 3 units novolog every 4 hours starting today, 10 units lantus/day starting today, 15 ml liquid multivitamin/day, 40 mg IV  protonix BID, 17 g miralax/day. Drip; fentanyl @ 50 mcg/hr.    NUTRITION - FOCUSED PHYSICAL EXAM:  Completed; no muscle or fat depletions; moderate pitting edema to BUE and deep pitting edema to BLE.  Diet Order:   Diet Order             Diet NPO time specified  Diet effective now                   EDUCATION NEEDS:   No education needs have been identified at this time  Skin:  Skin Assessment: Reviewed RN Assessment  Last BM:  PTA/unknown  Height:   Ht Readings from Last 1 Encounters:  04/22/21 _0  (1.575 m)    Weight:   Wt Readings from Last 1 Encounters:  04/21/21 77.7 kg      Estimated Nutritional Needs:  Kcal:  847-1078kcal/day Protein:  >100g/day Fluid:  1.3-1.5L/day        Jarome Matin, MS, RD, LDN, CNSC Inpatient Clinical Dietitian RD pager # available in AMION  After hours/weekend pager # available in [facility]

## 2021-04-23 NOTE — TOC Progression Note (Addendum)
Transition of Care [facility]) - Progression Note    Patient Details  Name: Leslie Bryant MRN: [id number] Date of Birth: July 22, 1941  [facility] CM/SW Contact  Leeroy Cha, RN Phone Number: 04/23/2021, 8:11 AM  Clinical Narrative:    [place] [facility] Events: Including procedures, antibiotic start and stop dates in addition to other pertinent events  6/16 - Admitted to [facility] for SOB, hypoxia, multifocal PNA 6/17 - PCCM consulted for persistent hypoxia, mild lethargy, ICU admission -> intubated later in day. CT chest with bilateral diffuse GGO.  Failed PICC line insertion 6/18 AM - worsening AKI - creat 1.8. on vent 80% fio2, supnine. Sedated with fent gtt, versed gtt. On pressors neo. Afebrile. Mag repletd. PCT high but culture negative so far. HS trop in 20s. ECHO - RV moderately enlarged and some reduction in RVEF (baseline eliquis for A Fib). On fent gtt 150 versed 22m -> RASS -4. On protoni and coffee ground mild returns noted this morning 6/18 PM - -in renal failure. Worsening creat. Dropping Ur op despite fluids. Bronch BAL -> 86% polys.  Alv  hge ruled out.  Elliquis now lower dose. Code changed to DNR + full medical care PLAN TOC: following for progression on the vent at 40%,in renal failure.  Plan unable to determine due to condition. Iv pressors and iv abx on going. Bun78 creat. 4.565 hgb 7.7, wbc 15.1   Expected Discharge Plan: Home/Self Care Barriers to Discharge: Continued Medical Work up  Expected Discharge Plan and Services Expected Discharge Plan: Home/Self Care   Discharge Planning Services: CM Consult   Living arrangements for the past 2 months: Single Family Home                                       Social Determinants of Health (SDOH) Interventions    Readmission Risk Interventions No flowsheet data found.

## 2021-04-24 ENCOUNTER — Inpatient Hospital Stay (HOSPITAL_COMMUNITY): Payer: Medicare Other

## 2021-04-24 DIAGNOSIS — J80 Acute respiratory distress syndrome: Secondary | ICD-10-CM | POA: Diagnosis not present

## 2021-04-24 DIAGNOSIS — N179 Acute kidney failure, unspecified: Secondary | ICD-10-CM | POA: Diagnosis not present

## 2021-04-24 LAB — CBC
HCT: 22.9 % — ABNORMAL LOW (ref 36.0–46.0)
Hemoglobin: 7.4 g/dL — ABNORMAL LOW (ref 12.0–15.0)
MCH: 32.5 pg (ref 26.0–34.0)
MCHC: 32.3 g/dL (ref 30.0–36.0)
MCV: 100.4 fL — ABNORMAL HIGH (ref 80.0–100.0)
Platelets: 227 K/uL (ref 150–400)
RBC: 2.28 MIL/uL — ABNORMAL LOW (ref 3.87–5.11)
RDW: 16.9 % — ABNORMAL HIGH (ref 11.5–15.5)
WBC: 15.5 K/uL — ABNORMAL HIGH (ref 4.0–10.5)
nRBC: 2.1 % — ABNORMAL HIGH (ref 0.0–0.2)

## 2021-04-24 LAB — GLUCOSE, CAPILLARY
Glucose-Capillary: 254 mg/dL — ABNORMAL HIGH (ref 70–99)
Glucose-Capillary: 241 mg/dL — ABNORMAL HIGH (ref 70–99)
Glucose-Capillary: 228 mg/dL — ABNORMAL HIGH (ref 70–99)
Glucose-Capillary: 237 mg/dL — ABNORMAL HIGH (ref 70–99)
Glucose-Capillary: 209 mg/dL — ABNORMAL HIGH (ref 70–99)
Glucose-Capillary: 186 mg/dL — ABNORMAL HIGH (ref 70–99)

## 2021-04-24 LAB — RENAL FUNCTION PANEL
Albumin: 1.5 g/dL — ABNORMAL LOW (ref 3.5–5.0)
Anion gap: 13 (ref 5–15)
BUN: 106 mg/dL — ABNORMAL HIGH (ref 8–23)
CO2: 20 mmol/L — ABNORMAL LOW (ref 22–32)
Calcium: 7.9 mg/dL — ABNORMAL LOW (ref 8.9–10.3)
Chloride: 101 mmol/L (ref 98–111)
Creatinine, Ser: 5.36 mg/dL — ABNORMAL HIGH (ref 0.44–1.00)
GFR, Estimated: 8 mL/min — ABNORMAL LOW (ref >60)
Glucose, Bld: 255 mg/dL — ABNORMAL HIGH (ref 70–99)
Phosphorus: 4.9 mg/dL — ABNORMAL HIGH (ref 2.5–4.6)
Potassium: 5 mmol/L (ref 3.5–5.1)
Sodium: 134 mmol/L — ABNORMAL LOW (ref 135–145)

## 2021-04-24 LAB — COMPREHENSIVE METABOLIC PANEL WITH GFR
ALT: 56 U/L — ABNORMAL HIGH (ref 0–44)
AST: 77 U/L — ABNORMAL HIGH (ref 15–41)
Albumin: 1.6 g/dL — ABNORMAL LOW (ref 3.5–5.0)
Alkaline Phosphatase: 107 U/L (ref 38–126)
Anion gap: 11 (ref 5–15)
BUN: 96 mg/dL — ABNORMAL HIGH (ref 8–23)
CO2: 21 mmol/L — ABNORMAL LOW (ref 22–32)
Calcium: 8 mg/dL — ABNORMAL LOW (ref 8.9–10.3)
Chloride: 103 mmol/L (ref 98–111)
Creatinine, Ser: 5.63 mg/dL — ABNORMAL HIGH (ref 0.44–1.00)
GFR, Estimated: 7 mL/min — ABNORMAL LOW
Glucose, Bld: 275 mg/dL — ABNORMAL HIGH (ref 70–99)
Potassium: 4.6 mmol/L (ref 3.5–5.1)
Sodium: 135 mmol/L (ref 135–145)
Total Bilirubin: 0.6 mg/dL (ref 0.3–1.2)
Total Protein: 6.4 g/dL — ABNORMAL LOW (ref 6.5–8.1)

## 2021-04-24 LAB — APTT
aPTT: 58 s — ABNORMAL HIGH (ref 24–36)
aPTT: 56 seconds — ABNORMAL HIGH (ref 24–36)
aPTT: 73 seconds — ABNORMAL HIGH (ref 24–36)

## 2021-04-24 LAB — CULTURE, RESPIRATORY W GRAM STAIN: Special Requests: NORMAL

## 2021-04-24 LAB — PROTIME-INR
INR: 1.7 — ABNORMAL HIGH (ref 0.8–1.2)
Prothrombin Time: 20.4 seconds — ABNORMAL HIGH (ref 11.4–15.2)

## 2021-04-24 LAB — PNEUMOCYSTIS JIROVECI SMEAR BY DFA: Pneumocystis jiroveci Ag: NEGATIVE

## 2021-04-24 LAB — PHOSPHORUS: Phosphorus: 5.6 mg/dL — ABNORMAL HIGH (ref 2.5–4.6)

## 2021-04-24 LAB — HEPARIN LEVEL (UNFRACTIONATED): Heparin Unfractionated: >1.1 IU/mL — ABNORMAL HIGH (ref 0.30–0.70)

## 2021-04-24 LAB — MPO/PR-3 (ANCA) ANTIBODIES

## 2021-04-24 MED ORDER — HEPARIN (PORCINE) 2000 UNITS/L FOR CRRT: INTRAVENOUS_CENTRAL | Status: DC | PRN

## 2021-04-24 MED ORDER — PRISMASOL BGK 4/2.5 32-4-2.5 MEQ/L REPLACEMENT SOLN: Status: DC

## 2021-04-24 MED ORDER — AMIODARONE HCL IN DEXTROSE 360-4.14 MG/200ML-% IV SOLN
30.0000 mg/h | INTRAVENOUS | Status: DC
  Administered 2021-04-24 – 2021-04-25 (×2): 30 mg/h via INTRAVENOUS
  Filled 2021-04-24: qty 200

## 2021-04-24 MED ORDER — HEPARIN (PORCINE) 25000 UT/250ML-% IV SOLN
950.0000 [IU]/h | INTRAVENOUS | Status: DC
  Administered 2021-04-25: 850 [IU]/h via INTRAVENOUS
  Filled 2021-04-24: qty 250

## 2021-04-24 MED ORDER — AMIODARONE HCL IN DEXTROSE 360-4.14 MG/200ML-% IV SOLN
60.0000 mg/h | INTRAVENOUS | Status: DC
  Administered 2021-04-24: 60 mg/h via INTRAVENOUS
  Filled 2021-04-24: qty 200

## 2021-04-24 MED ORDER — DEXMEDETOMIDINE HCL IN NACL 200 MCG/50ML IV SOLN: 0.4000 ug/kg/h | INTRAVENOUS | Status: DC

## 2021-04-24 MED ORDER — PRISMASOL BGK 4/2.5 32-4-2.5 MEQ/L EC SOLN: Status: DC

## 2021-04-24 MED ORDER — HEPARIN SODIUM (PORCINE) 1000 UNIT/ML DIALYSIS
1000.0000 [IU] | INTRAMUSCULAR | Status: DC | PRN
  Administered 2021-04-24: 2400 [IU] via INTRAVENOUS_CENTRAL
  Administered 2021-05-02 – 2021-05-06 (×3): 3000 [IU] via INTRAVENOUS_CENTRAL
  Filled 2021-04-24 (×4): qty 6

## 2021-04-24 MED ORDER — SODIUM CHLORIDE 0.9 % IV SOLN
2.0000 g | Freq: Two times a day (BID) | INTRAVENOUS | Status: DC
  Administered 2021-04-24 – 2021-04-25 (×2): 2 g via INTRAVENOUS
  Filled 2021-04-24 (×2): qty 2

## 2021-04-24 MED ORDER — INSULIN ASPART 100 UNIT/ML IJ SOLN
5.0000 [IU] | INTRAMUSCULAR | Status: DC
  Administered 2021-04-24 – 2021-04-30 (×36): 5 [IU] via SUBCUTANEOUS

## 2021-04-24 MED ORDER — MIDAZOLAM HCL 2 MG/2ML IJ SOLN: 1.0000 mg | INTRAMUSCULAR | Status: DC | PRN

## 2021-04-24 NOTE — Procedures (Signed)
Central Venous Catheter Insertion Procedure Note  LESHONDA GALAMBOS  [id number]  08-03-1941  Date:04/24/21  Time:2:44 PM   Provider Performing:Kathaleen Dudziak D. Harris   Procedure: Insertion of Non-tunneled Central Venous Catheter(36556)with US guidance (41030)      Indication(s) Hemodialysis  Consent Risks of the procedure as well as the alternatives and risks of each were explained to the patient and/or caregiver.  Consent for the procedure was obtained and is signed in the bedside chart  Anesthesia Topical only with 1% lidocaine   Timeout Verified patient identification, verified procedure, site/side was marked, verified correct patient position, special equipment/implants available, medications/allergies/relevant history reviewed, required imaging and test results available.  Sterile Technique Maximal sterile technique including full sterile barrier drape, hand hygiene, sterile gown, sterile gloves, mask, hair covering, sterile ultrasound probe cover (if used).  Procedure Description Area of catheter insertion was cleaned with chlorhexidine and draped in sterile fashion.   With real-time ultrasound guidance a HD catheter was placed into the right internal jugular vein.  Nonpulsatile blood flow and easy flushing noted in all ports.  The catheter was sutured in place and sterile dressing applied.  Complications/Tolerance None; patient tolerated the procedure well. Chest X-ray is ordered to verify placement for internal jugular or subclavian cannulation.  Chest x-ray is not ordered for femoral cannulation.  EBL Minimal  Specimen(s) None  Silvano Garofano D. Kenton Kingfisher, NP-C [facility] Personal contact information can be found on Amion  04/24/2021, 2:44 PM

## 2021-04-24 NOTE — Progress Notes (Addendum)
NAME:  Leslie Bryant MRN:  [id number] DOB:  04-20-1941 LOS: 5 ADMISSION DATE:  04/10/2021 CONSULTATION DATE: 04/20/2021 REFERRING MD:  Alfredia Ferguson - TRH CHIEF COMPLAINT:  SOB, hypoxic respiratory failure   Brief Narrative   80 year old female presented with presented to [facility] 6/16 via EMS for worsening SOB, hypoxia and concern for PNA.On ED evaluation patient was hypoxic on RA and in A-fib RVR .CXR demonsrated bilateral lower lobe infiltrates. Labs were notable for Na 123, BNP 467, Trop 20 and WBC 15K. Patient was admitted to [facility].  PCCM consulted 6/17 for ongoing hypoxemic respiratory failure despite supplemental O2.  Pertinent Medical History:  A-fib Right breast cancer  Bowel perforation  HLD  Significant Hospital Events: Including procedures, antibiotic start and stop dates in addition to other pertinent events   6/16 - Admitted to [facility] for SOB, hypoxia, multifocal PNA 6/17 - PCCM consulted for persistent hypoxia, mild lethargy, ICU admission -> intubated later in day. CT chest with bilateral diffuse GGO.  Failed PICC line insertion 6/18 AM - worsening AKI - creat 1.8. on vent 80% fio2, supnine. Sedated with fent gtt, versed gtt. On pressors neo. PCT high but culture negative so far. HS trop in 20s. RASS -4. On protoni and coffee ground mild returns noted this morning 6/18 PM - -in renal failure. Worsening creat. Dropping Ur op despite fluids. Bronch BAL -> 86% polys.  Alv  hge ruled out.  Elliquis now lower dose. Code changed to DNR + full medical care 6/20 Remains sedated on vent minimally responsive on fentanyl drip with worsening renal failure, now off pressors 6/21 progressive multi - organ dysfunction including worsening renal failure, mild liver injury with slight transaminitis, and continued encephalopathy  Interim History / Subjective:  Bedside RN reports intermittent vent dyssynchrony this improves with intentional bolus 50 cc of urine output over the last 24 hours, overall 7 L  positive  Objective:  Blood pressure (!) 133/57, pulse 98, temperature 98.6 F (37 C), temperature source Axillary, resp. rate (!) 34, height _0  (1.575 m), weight 77.7 kg, SpO2 97 %.    Vent Mode: PRVC FiO2 (%):  [60 %] 60 % Set Rate:  [35 bmp] 35 bmp Vt Set:  [350 mL] 350 mL PEEP:  [12 cmH20] 12 cmH20 Plateau Pressure:  [18 cmH20-35 cmH20] 35 cmH20   Intake/Output Summary (Last 24 hours) at 04/24/2021 4098 Last data filed at 04/24/2021 1191 Gross per 24 hour  Intake 2103.77 ml  Output 50 ml  Net 2053.77 ml    Filed Weights   04/14/2021 2030 04/20/21 0600 04/21/21 0216  Weight: 75.7 kg 74.7 kg 77.7 kg    Physical Exam General: Acute on chronic ill-appearing deconditioned elderly female lying in bed on mechanical ventilation in no acute distress  HEENT: ETT, MM pink/moist, PERRL, Battle sign bruising Neuro: Sedated on ventilator, spontaneous movement seen on vent during vent dyssynchrony CV: s1s2 regular rate and rhythm, no murmur, rubs, or gallops,  PULM: Clear to auscultation bilaterally, mechanical breath sounds, no increased work of breathing, slight intention dyssynchrony intermittently, no increased work of breathing GI: soft, bowel sounds active in all 4 quadrants, non-tender, non-distended, tolerating TF Extremities: warm/dry, no edema  Skin: no rashes or lesions  Labs/imaging that I have personally reviewed:  ECHO - RV moderately enlarged and some reduction in RVEF (baseline eliquis for A Fib). 6/20 Pertinent labs: glucose 285, creatinine 4.56, BUN 78, phos 5.5, albumin 1.8, WBC 15.1,   Resolved Hospital Problem List:   Septic shock -  Pressors stopped 6/19  Assessment & Plan:   Acute metabolic encephalopathy Hx of falls with "Battle Eyes" prior to admission  Baseline hx of Giant Cell Arteritis on chronic steroids - CT head 04/16/21 - negative P:   Maintain neuro protective measures; goal for eurothermia, euglycemia, eunatermia, normoxia, and PCO2 goal of  35-40 Nutrition and bowel regiment  Seizure precautions  Aspirations precautions  Minimize sedation  Fentanyl drip per protocol   Mild new onset RV failure at admit 04/20/21.  -Unlikely PE due to baseline eliquis -EF 55-60% with moderate reduce RV function  Hx of A Fib on amio currently in A-fib -on eliquis (amio held at admit, cardizem stopped 6/17 pre intubation) P: Strict intake and output  Daily weight  Optimize electrolytes  Monitor volume status closely  Continuous telemetry   Severe Acute Respiratory Failure due to ARDS -Deterioration 6/17 resulting in intubation P: Continue ventilator support with lung protective strategies  Wean PEEP and FiO2 for sats greater than 90%. Head of bed elevated 30 degrees. Plateau pressures less than 30 cm H20.  Follow intermittent chest x-ray and ABG.   SAT/SBT as tolerated, mentation preclude extubation  Ensure adequate pulmonary hygiene  Follow cultures  VAP bundle in place  PAD protocol  Mild transaminates  -LFTs noted to be elevated slightly on the mend 6/21, concern for progressive of multiorgan dysfunction  At risk malnutrition -In the setting of prolonged critical illness P: Intermittently trend LFTs Avoid hepatotoxins Continue to treat reversible causes Continue tube feed feeds Continue PPI for stress ulcer prophylaxis  AKI POA now with progression to oliguric renal failure  -Baseline creat 0.73m 04/16/21, creatinine now uptrended to 4.56, GFR 9 -Renal UK[place]egative P: Nephrology following, appreciate assistance  Follow renal function  Monitor urine output Trend Bmet Avoid nephrotoxinn Ensure adequate renal perfusion  Positive fluid balance, approximately 7 L  Baesline mild anemia prior to admit  -No alveolar hemorrhage on bronchoscopy 04/21/2021 P: Trend CBC  Monitor for signs of bleeding  Transfuse per protocol  Continue heparin drip, plt and CBC remains stable  Follow heparin level   Hyperglycemia with  likely progression to type 2 diabetes -Patient has been persistently hyperglycemic this admission.  It appears glucose has remained elevated since starting steroids for giant cell arteritis.  Most recent hemoglobin A1c 9.1 Baseline immunosuppressed due to chronic steroids P: Continue SSI Increase long-acting insulin due to suboptimal control Continue tube feed coverage Blood sugar checks every 4 hours CBG goal 140-180 Remains on stress dose steroids can likely begin to wean   Klebsiella UTI, POA -Resistant to ampicillin only P: Remains on IV cefepime, start date 6/18 Ceftriaxone 6/16 > 6/18 Ceftriaxone 6/16 > 6/17 BAL studies pending  Line/Tube/Drains -ET tube 6/17 > still indicated unable to be removed -Indwelling urinary catheter 6/17 > still indicated unable to be removed -OG-tube 6/17 > still indicated unable to be removed -Right femoral CVC 6/17 > no longer needed removed 6/21 -Right femoral A-line 6/17 > no longer needed removed 6/21  Best Practice   Diet/type: tubefeeds Pain/Anxiety/Delirium protocol RASS goal: 0 to -1 VAP protocol (if indicated): Yes DVT prophylaxis: systemic heparin GI prophylaxis: PPI Glucose control:  SSI - increased due to poor glycemic control  Central venous access:  Yes, and it is no longer needed and removal ordered  Arterial line:  Yes, and it is no longer needed and removal ordered  Foley:  Yes, and it is still needed Mobility:  bed rest  PT consulted: N/A Studies  pending: None Culture data pending:none Last reviewed culture data:today Antibiotics:cefepime Antibiotic de-escalation: no,  continue current rx Stop date: ordered Daily labs: ordered Code Status:  DNR Last date of multidisciplinary goals of care discussion:Update family daily,  ccm prognosis: Life-threating Disposition: remains critically ill, will stay in intensive care   Critical care time:    Performed by: Wynelle Dreier D. Harris  Total critical care time: 38   minutes  Critical care time was exclusive of separately billable procedures and treating other patients.  Critical care was necessary to treat or prevent imminent or life-threatening deterioration.  Critical care was time spent personally by me on the following activities: development of treatment plan with patient and/or surrogate as well as nursing, discussions with consultants, evaluation of patient's response to treatment, examination of patient, obtaining history from patient or surrogate, ordering and performing treatments and interventions, ordering and review of laboratory studies, ordering and review of radiographic studies, pulse oximetry and re-evaluation of patient's condition.  Amedee Cerrone D. Kenton Kingfisher, NP-C [facility] Personal contact information can be found on Amion  04/24/2021, 7:13 AM

## 2021-04-24 NOTE — Progress Notes (Signed)
PCCM Progress Note  Notified by nurse that since initiation of CRRT patients rate and rhythm is  less controled. On bedside telemetry patient is seen in intermittent A-fib AVR with rates fluctuation 110-130's.   There was an initial concern that Amiodarone toxicity could be causing patients ARDS picture but BAL shows no concerning features. Therefore order written for Amiodarone drip   Given the need for continuous Heparin and now Amiodarone patient will likely need additional access. Original plan was to remove femoral CVC but if additional access is needed the CVC can remain in place.   Will continue to address need for central access daily and if needed can exchange femoral CVC for IJ soon   Keyarra Rendall D. Kenton Kingfisher, NP-C [facility] Personal contact information can be found on Amion  04/24/2021, 6:01 PM

## 2021-04-24 NOTE — Progress Notes (Signed)
Admit: 04/28/2021 LOS: 5  63F nl GFR with anuric AKI in setting of CAP/ARDS/VDRF, shock now off pressors, hx/o giant cell arteritis  Subjective:  Off pressors Son and Husband at bedside ANuric UOP < 100 60% FIO2 SCr/BUN cont inc, K 4.6, BUN 96, HCO3 21  06/20 0701 - 06/21 0700 In: 2103.8 [I.V.:623.8; NG/GT:1380; IV Piggyback:100] Out: 50 [Urine:50]  Filed Weights   05/03/2021 2030 04/20/21 0600 04/21/21 0216  Weight: 75.7 kg 74.7 kg 77.7 kg    Scheduled Meds:  chlorhexidine gluconate (MEDLINE KIT)  15 mL Mouth Rinse BID   Chlorhexidine Gluconate Cloth  6 each Topical Daily   docusate  100 mg Per Tube BID   free water  30 mL Per Tube Q4H   hydrocortisone sod succinate (SOLU-CORTEF) inj  50 mg Intravenous BID   insulin aspart  0-15 Units Subcutaneous Q4H   insulin aspart  5 Units Subcutaneous Q4H   insulin glargine  10 Units Subcutaneous Daily   ipratropium  0.5 mg Nebulization Q6H   levalbuterol  0.63 mg Nebulization Q6H   mouth rinse  15 mL Mouth Rinse 10 times per day   multivitamin  15 mL Per Tube Daily   pantoprazole (PROTONIX) IV  40 mg Intravenous Q12H   polyethylene glycol  17 g Per Tube Daily   sodium chloride flush  10-40 mL Intracatheter Q12H   Continuous Infusions:  sodium chloride     sodium chloride Stopped (04/24/21 0939)   ceFEPime (MAXIPIME) IV Stopped (04/23/21 1630)   feeding supplement (VITAL HIGH PROTEIN) 1,000 mL (04/23/21 2307)   fentaNYL infusion INTRAVENOUS 100 mcg/hr (04/24/21 1245)   heparin 850 Units/hr (04/24/21 0641)   PRN Meds:.Place/Maintain arterial line **AND** sodium chloride, acetaminophen, acetaminophen, fentaNYL, sodium chloride flush  Current Labs: reviewed  6/16 UA w/o hematuria, trace protein 6/19 Renal US no HN  Physical Exam:  Blood pressure (!) 119/58, pulse (!) 103, temperature 97.9 F (36.6 C), temperature source Axillary, resp. rate 18, height 5' 2"  (1.575 m), weight 77.7 kg, SpO2 92 %. Intubated sedated Tachy  IRIR Coarse bs b/l 1+ LEE S/nt/nd No rashes/lesions ETT in place, NCAT  A Anuric AKI, likely ATN in setting of #2 and #3. Given prolonged anuric state, progressive azotemia, and hypervolemia will initiation CRRT after discussing with family.  They are aware unclear if/when will recover GFR, but hopeful to see UOP pick up soon.  ARDS/CAP/VDRF: per CCM, will go for some UF with CRRT Septic Shock, improving off pressors Giant Cell arteritis, chronic IS, now on stress dosing Anemia, transfusion per primary [name] UTI: ABX per CCM AFib Hypervolemia  P CCM to assist with [facility] Start CRRT: all 4K, 500, 1000, 200, no hep in CRRT circuit, 50 to 123m/h net neg Remains reasonable to hope for GFR Recovery with ongoing supportive care, trend UOP  RPearson GrippeMD 04/24/2021, 1:39 PM  Recent Labs  Lab 04/22/21 0401 04/23/21 0452 04/23/21 1031 04/24/21 0515  NA 135 133* 135 135  K 4.4 4.4 4.3 4.6  CL 103 100 101 103  CO2 23 21* 23 21*  GLUCOSE 213* 285* 267* 275*  BUN 51* 78* 81* 96*  CREATININE 3.24* 4.56* 4.78* 5.63*  CALCIUM 7.5* 7.7* 7.8* 8.0*  PHOS 6.2* 5.5*  --  5.6*    Recent Labs  Lab 04/18/2021 1527 04/20/21 0248 04/22/21 0401 04/22/21 0927 04/23/21 0035 04/24/21 0515  WBC 15.7*   < > 19.4*  --  15.1* 15.5*  NEUTROABS 14.8*  --   --   --   --   --  HGB 10.9*   < > 8.0* 7.7* 7.7* 7.4*  HCT 31.1*   < > 25.5*  --  24.0* 22.9*  MCV 92.3   < > 102.4*  --  100.8* 100.4*  PLT 181   < > 214  --  189 227   < > = values in this interval not displayed.

## 2021-04-24 NOTE — Progress Notes (Signed)
Inpatient Diabetes Program Recommendations  AACE/ADA: New Consensus Statement on Inpatient Glycemic Control (2015)  Target Ranges:  Prepandial:   less than 140 mg/dL      Peak postprandial:   less than 180 mg/dL (1-2 hours)      Critically ill patients:  140 - 180 mg/dL   Lab Results  Component Value Date   GLUCAP 241 (H) 04/24/2021   HGBA1C 9.1 (H) 04/20/2021    Review of Glycemic Control Results for KATERIA, CUTRONA (MRN [id number]) as of 04/24/2021 10:49  Ref. Range 04/23/2021 23:46 04/24/2021 03:56 04/24/2021 07:51  Glucose-Capillary Latest Ref Range: 70 - 99 mg/dL 283 (H) 254 (H) 241 (H)   Diabetes history: DM 2 Outpatient Diabetes medications:  None Current orders for Inpatient glycemic control:  Lantus 10 units daily Novolog moderate q 4 hours Novolog 3 units q 4 hours (tube feed coverage) Inpatient Diabetes Program Recommendations:    Consider increasing Lantus to 10 units bid and increase Novolog tube feed coverage to 5 units q 4 hours.   Thanks,  Adah Perl, RN, BC-ADM Inpatient Diabetes Coordinator Pager [phone] (8a-5p)

## 2021-04-24 NOTE — Progress Notes (Signed)
[place] received verbal referral from charge RN for support for family as pt. is about to go on CRRT; [place] attempted visit but husband at bedside engaged in phone call.  [facility] will follow up if possible later this week, but remain available via page as needed.  Lindaann Pascal PRN Chaplain Pager: [phone]

## 2021-04-24 NOTE — Progress Notes (Signed)
PHARMACY NOTE:  ANTIMICROBIAL RENAL DOSAGE ADJUSTMENT  Current antimicrobial regimen includes a mismatch between antimicrobial dosage and estimated renal function.  As per policy approved by the Pharmacy & Therapeutics and Medical Executive Committees, the antimicrobial dosage will be adjusted accordingly.  Current antimicrobial dosage:  Cefepime 1g IV Q24h  Indication: Klebsiella pneumomiae UTI  Renal Function:  Estimated Creatinine Clearance: 7.7 mL/min (A) (by C-G formula based on SCr of 5.63 mg/dL (H)). []      On intermittent HD, scheduled: [x]      On CRRT - starting on 6/21    Antimicrobial dosage has been changed to:  Cefepime 2 GM q12h   Thank you for allowing pharmacy to be a part of this patient's care.  Gretta Arab PharmD, BCPS Clinical Pharmacist [facility] main pharmacy [phone] 04/24/2021 2:09 PM

## 2021-04-24 NOTE — Progress Notes (Signed)
[facility] for Heparin Indication: atrial fibrillation  Allergies  Allergen Reactions   Crestor [Rosuvastatin Calcium] Other (See Comments)    Shoulder pain   Crestor [Rosuvastatin]     Other reaction(s): Unknown   Lipitor [Atorvastatin Calcium] Other (See Comments)    Myalgias    Pravachol [Pravastatin] Other (See Comments)    myalgias   Penicillins Itching    Has patient had a PCN reaction causing immediate rash, facial/tongue/throat swelling, SOB or lightheadedness with hypotension: No Has patient had a PCN reaction causing severe rash involving mucus membranes or skin necrosis: No Has patient had a PCN reaction that required hospitalization No Has patient had a PCN reaction occurring within the last 10 years: No If all of the above answers are "NO", then may proceed with Cephalosporin use.     Patient Measurements: Height: 5' 2"  (157.5 cm) Weight: 77.7 kg (171 lb 4.8 oz) IBW/kg (Calculated) : 50.1 Heparin Dosing Weight: 67 kg  Vital Signs: Temp: 97.9 F (36.6 C) (06/21 1158) Temp Source: Axillary (06/21 1158) BP: 119/58 (06/21 1000) Pulse Rate: 103 (06/21 1000)  Labs: Recent Labs    04/22/21 0401 04/22/21 0927 04/22/21 1031 04/22/21 2000 04/23/21 0035 04/23/21 0400 04/23/21 0452 04/23/21 1031 04/24/21 0515 04/24/21 0803 04/24/21 1430  HGB 8.0*   < >  --   --  7.7*  --   --   --  7.4*  --   --   HCT 25.5*  --   --   --  24.0*  --   --   --  22.9*  --   --   PLT 214  --   --   --  189  --   --   --  227  --   --   APTT  --    < > 39* 79*  --  71*  --   --  58*  --  56*  LABPROT  --   --   --   --   --   --   --   --   --  20.4*  --   INR  --   --   --   --   --   --   --   --   --  1.7*  --   HEPARINUNFRC  --   --  >1.10* >1.10*  --   --   --   --  >1.10*  --   --   CREATININE 3.24*  --   --   --   --   --  4.56* 4.78* 5.63*  --   --    < > = values in this interval not displayed.    Estimated Creatinine Clearance:  7.7 mL/min (A) (by C-G formula based on SCr of 5.63 mg/dL (H)).  Medications:  Scheduled:   chlorhexidine gluconate (MEDLINE KIT)  15 mL Mouth Rinse BID   Chlorhexidine Gluconate Cloth  6 each Topical Daily   docusate  100 mg Per Tube BID   free water  30 mL Per Tube Q4H   hydrocortisone sod succinate (SOLU-CORTEF) inj  50 mg Intravenous BID   insulin aspart  0-15 Units Subcutaneous Q4H   insulin aspart  5 Units Subcutaneous Q4H   insulin glargine  10 Units Subcutaneous Daily   ipratropium  0.5 mg Nebulization Q6H   levalbuterol  0.63 mg Nebulization Q6H   mouth rinse  15 mL Mouth Rinse 10 times  per day   multivitamin  15 mL Per Tube Daily   pantoprazole (PROTONIX) IV  40 mg Intravenous Q12H   polyethylene glycol  17 g Per Tube Daily   sodium chloride flush  10-40 mL Intracatheter Q12H   Infusions:    prismasol BGK 4/2.5 500 mL/hr at 04/24/21 1602    prismasol BGK 4/2.5 200 mL/hr at 04/24/21 1600   sodium chloride     sodium chloride Stopped (04/24/21 0939)   ceFEPime (MAXIPIME) IV     feeding supplement (VITAL HIGH PROTEIN) 1,000 mL (04/23/21 2307)   fentaNYL infusion INTRAVENOUS 100 mcg/hr (04/24/21 1245)   heparin 850 Units/hr (04/24/21 0641)   prismasol BGK 4/2.5 1,000 mL/hr at 04/24/21 1603    Assessment: 59 yoF admit 6/16 with ShOB, DOE, recurrent falls, BLE edema. Afib on admit to ED from PCP, RVR PMH: Afib on Apixaban, HTN, GCA/Pred, DM2  Renal fx worsening, Apixaban 45m bid resumed on admit > reduced to 2.512mbid 6/18 > Heparin infusion 6/19 DOAC will falsely elevated Heparin levels, will monitor aPTT and Heparin levels - till they correlate.  04/24/2021 4:04 PM  aPTT 56 subtherapeutic though heparin paused for ~ 15 minutes for procedure HL remains falsely elevated at > 1.1 Hgb down to 7.4, Plts WNL Scr up to 5.63 Minimal blood loss with placement of HD cath per CCM note   Goal of Therapy:  Heparin level 0.3-0.7 units/ml aPTT 66-102 seconds Monitor platelets  by anticoagulation protocol: Yes   Plan:  Resume heparin infusion at 85015mnits/hr Check aPTT 8 hr after heparin resumed Daily aPTT, HL and CBC  ChrUlice Dash 04/24/2021, 4:04 PM

## 2021-04-24 NOTE — Progress Notes (Addendum)
[facility] for Heparin Indication: atrial fibrillation  Allergies  Allergen Reactions   Crestor [Rosuvastatin Calcium] Other (See Comments)    Shoulder pain   Crestor [Rosuvastatin]     Other reaction(s): Unknown   Lipitor [Atorvastatin Calcium] Other (See Comments)    Myalgias    Pravachol [Pravastatin] Other (See Comments)    myalgias   Penicillins Itching    Has patient had a PCN reaction causing immediate rash, facial/tongue/throat swelling, SOB or lightheadedness with hypotension: No Has patient had a PCN reaction causing severe rash involving mucus membranes or skin necrosis: No Has patient had a PCN reaction that required hospitalization No Has patient had a PCN reaction occurring within the last 10 years: No If all of the above answers are "NO", then may proceed with Cephalosporin use.     Patient Measurements: Height: 5' 2"  (157.5 cm) Weight: 77.7 kg (171 lb 4.8 oz) IBW/kg (Calculated) : 50.1 Heparin Dosing Weight: 67 kg  Vital Signs: Temp: 98.6 F (37 C) (06/21 0400) Temp Source: Axillary (06/21 0400) BP: 133/57 (06/21 0400) Pulse Rate: 110 (06/21 0430)  Labs: Recent Labs    04/22/21 0401 04/22/21 0927 04/22/21 1031 04/22/21 2000 04/23/21 0035 04/23/21 0400 04/23/21 0452 04/23/21 1031 04/24/21 0515  HGB 8.0*   < >  --   --  7.7*  --   --   --  7.4*  HCT 25.5*  --   --   --  24.0*  --   --   --  22.9*  PLT 214  --   --   --  189  --   --   --  227  APTT  --    < > 39* 79*  --  71*  --   --  58*  HEPARINUNFRC  --   --  >1.10* >1.10*  --   --   --   --  >1.10*  CREATININE 3.24*  --   --   --   --   --  4.56* 4.78* 5.63*   < > = values in this interval not displayed.    Estimated Creatinine Clearance: 7.7 mL/min (A) (by C-G formula based on SCr of 5.63 mg/dL (H)).  Medications:  Scheduled:   chlorhexidine gluconate (MEDLINE KIT)  15 mL Mouth Rinse BID   Chlorhexidine Gluconate Cloth  6 each Topical Daily    docusate  100 mg Per Tube BID   fentaNYL (SUBLIMAZE) injection  25 mcg Intravenous Once   free water  30 mL Per Tube Q4H   hydrocortisone sod succinate (SOLU-CORTEF) inj  50 mg Intravenous BID   insulin aspart  0-15 Units Subcutaneous Q4H   insulin aspart  3 Units Subcutaneous Q4H   insulin glargine  10 Units Subcutaneous Daily   ipratropium  0.5 mg Nebulization Q6H   levalbuterol  0.63 mg Nebulization Q6H   mouth rinse  15 mL Mouth Rinse 10 times per day   multivitamin  15 mL Per Tube Daily   pantoprazole (PROTONIX) IV  40 mg Intravenous Q12H   polyethylene glycol  17 g Per Tube Daily   sodium chloride flush  10-40 mL Intracatheter Q12H   Infusions:   sodium chloride     sodium chloride 10 mL/hr at 04/23/21 2314   ceFEPime (MAXIPIME) IV Stopped (04/23/21 1630)   feeding supplement (VITAL HIGH PROTEIN) 1,000 mL (04/23/21 2307)   fentaNYL infusion INTRAVENOUS 100 mcg/hr (04/24/21 0046)   heparin  Assessment: 32 yoF admit 6/16 with ShOB, DOE, recurrent falls, BLE edema. Afib on admit to ED from PCP, RVR PMH: Afib on Apixaban, HTN, GCA/Pred, DM2  Renal fx worsening, Apixaban 55m bid resumed on admit > reduced to 2.529mbid 6/18 > Heparin infusion 6/19 DOAC will falsely elevated Heparin levels, will monitor aPTT and Heparin levels - till they correlate.  04/24/2021 aPTT 58 subtherapeutic on 750 units/hr HL remains falsely elevated at > 1.1 Hgb down to 7.4, Plts WNL Scr up to 5.63 No bleeding noted   Goal of Therapy:  Heparin level 0.3-0.7 units/ml aPTT 66-102 seconds Monitor platelets by anticoagulation protocol: Yes   Plan:  Increase Heparin infusion to 85074mnits/hr Check aPTT 8 hr after heparin rate increase Daily aPTT, HL and CBC  LeaLeone HavenharmD 04/24/2021, 6:13 AM

## 2021-04-25 DIAGNOSIS — I4891 Unspecified atrial fibrillation: Secondary | ICD-10-CM

## 2021-04-25 LAB — APTT
aPTT: 64 seconds — ABNORMAL HIGH (ref 24–36)
aPTT: 65 seconds — ABNORMAL HIGH (ref 24–36)
aPTT: 92 seconds — ABNORMAL HIGH (ref 24–36)

## 2021-04-25 LAB — CBC
HCT: 22.1 % — ABNORMAL LOW (ref 36.0–46.0)
Hemoglobin: 7 g/dL — ABNORMAL LOW (ref 12.0–15.0)
MCH: 32.3 pg (ref 26.0–34.0)
MCHC: 31.7 g/dL (ref 30.0–36.0)
MCV: 101.8 fL — ABNORMAL HIGH (ref 80.0–100.0)
Platelets: 190 10*3/uL (ref 150–400)
RBC: 2.17 MIL/uL — ABNORMAL LOW (ref 3.87–5.11)
RDW: 17.6 % — ABNORMAL HIGH (ref 11.5–15.5)
WBC: 16.4 10*3/uL — ABNORMAL HIGH (ref 4.0–10.5)
nRBC: 4.2 % — ABNORMAL HIGH (ref 0.0–0.2)

## 2021-04-25 LAB — RENAL FUNCTION PANEL
Albumin: 1.5 g/dL — ABNORMAL LOW (ref 3.5–5.0)
Albumin: 1.7 g/dL — ABNORMAL LOW (ref 3.5–5.0)
Anion gap: 10 (ref 5–15)
Anion gap: 6 (ref 5–15)
BUN: 79 mg/dL — ABNORMAL HIGH (ref 8–23)
BUN: 59 mg/dL — ABNORMAL HIGH (ref 8–23)
CO2: 23 mmol/L (ref 22–32)
CO2: 26 mmol/L (ref 22–32)
Calcium: 7.8 mg/dL — ABNORMAL LOW (ref 8.9–10.3)
Calcium: 7.9 mg/dL — ABNORMAL LOW (ref 8.9–10.3)
Chloride: 102 mmol/L (ref 98–111)
Chloride: 104 mmol/L (ref 98–111)
Creatinine, Ser: 3.84 mg/dL — ABNORMAL HIGH (ref 0.44–1.00)
Creatinine, Ser: 2.33 mg/dL — ABNORMAL HIGH (ref 0.44–1.00)
GFR, Estimated: 11 mL/min — ABNORMAL LOW (ref >60)
GFR, Estimated: 21 mL/min — ABNORMAL LOW (ref >60)
Glucose, Bld: 194 mg/dL — ABNORMAL HIGH (ref 70–99)
Glucose, Bld: 151 mg/dL — ABNORMAL HIGH (ref 70–99)
Phosphorus: 3.6 mg/dL (ref 2.5–4.6)
Phosphorus: 2.7 mg/dL (ref 2.5–4.6)
Potassium: 5.4 mmol/L — ABNORMAL HIGH (ref 3.5–5.1)
Potassium: 4.8 mmol/L (ref 3.5–5.1)
Sodium: 135 mmol/L (ref 135–145)
Sodium: 136 mmol/L (ref 135–145)

## 2021-04-25 LAB — HEPARIN LEVEL (UNFRACTIONATED): Heparin Unfractionated: >1.1 IU/mL — ABNORMAL HIGH (ref 0.30–0.70)

## 2021-04-25 LAB — PHOSPHORUS: Phosphorus: 3.6 mg/dL (ref 2.5–4.6)

## 2021-04-25 LAB — GLUCOSE, CAPILLARY
Glucose-Capillary: 131 mg/dL — ABNORMAL HIGH (ref 70–99)
Glucose-Capillary: 134 mg/dL — ABNORMAL HIGH (ref 70–99)
Glucose-Capillary: 156 mg/dL — ABNORMAL HIGH (ref 70–99)
Glucose-Capillary: 175 mg/dL — ABNORMAL HIGH (ref 70–99)
Glucose-Capillary: 175 mg/dL — ABNORMAL HIGH (ref 70–99)
Glucose-Capillary: 208 mg/dL — ABNORMAL HIGH (ref 70–99)

## 2021-04-25 LAB — MAGNESIUM: Magnesium: 2.8 mg/dL — ABNORMAL HIGH (ref 1.7–2.4)

## 2021-04-25 LAB — PREPARE RBC (CROSSMATCH)

## 2021-04-25 MED ORDER — MIDAZOLAM BOLUS VIA INFUSION
0.0000 mg | INTRAVENOUS | Status: DC | PRN
  Administered 2021-04-25: 2 mg via INTRAVENOUS
  Filled 2021-04-25: qty 5

## 2021-04-25 MED ORDER — DOCUSATE SODIUM 50 MG/5ML PO LIQD
100.0000 mg | Freq: Every day | ORAL | Status: DC | PRN
  Administered 2021-04-30: 100 mg
  Filled 2021-04-25: qty 10

## 2021-04-25 MED ORDER — MIDAZOLAM 50MG/50ML (1MG/ML) PREMIX INFUSION
0.0000 mg/h | INTRAVENOUS | Status: DC
  Administered 2021-04-25 – 2021-04-27 (×3): 2 mg/h via INTRAVENOUS
  Filled 2021-04-25 (×3): qty 50

## 2021-04-25 MED ORDER — MIDAZOLAM 50MG/50ML (1MG/ML) PREMIX INFUSION
0.0000 mg/h | INTRAVENOUS | Status: DC
  Administered 2021-04-25: 2 mg/h via INTRAVENOUS
  Filled 2021-04-25: qty 50

## 2021-04-25 MED ORDER — AMIODARONE HCL 200 MG PO TABS
200.0000 mg | ORAL_TABLET | Freq: Every day | ORAL | Status: DC
  Administered 2021-04-25 – 2021-05-07 (×13): 200 mg
  Filled 2021-04-25 (×13): qty 1

## 2021-04-25 MED ORDER — MIDAZOLAM BOLUS VIA INFUSION
0.0000 mg | INTRAVENOUS | Status: DC | PRN
  Administered 2021-04-26 – 2021-04-27 (×2): 2 mg via INTRAVENOUS
  Filled 2021-04-25: qty 5

## 2021-04-25 MED ORDER — SODIUM CHLORIDE 0.9% IV SOLUTION: Freq: Once | INTRAVENOUS | Status: DC

## 2021-04-25 MED ORDER — PREDNISONE 10 MG PO TABS
10.0000 mg | ORAL_TABLET | Freq: Two times a day (BID) | ORAL | Status: DC
  Administered 2021-04-25 – 2021-05-07 (×25): 10 mg
  Filled 2021-04-25 (×26): qty 1

## 2021-04-25 MED ORDER — PANTOPRAZOLE SODIUM 40 MG PO PACK
40.0000 mg | PACK | Freq: Two times a day (BID) | ORAL | Status: DC
  Administered 2021-04-25 – 2021-05-07 (×25): 40 mg
  Filled 2021-04-25 (×25): qty 20

## 2021-04-25 MED ORDER — PRISMASOL BGK 0/2.5 32-2.5 MEQ/L EC SOLN
Status: DC
  Filled 2021-04-25 (×31): qty 5000

## 2021-04-25 MED ORDER — POLYETHYLENE GLYCOL 3350 17 G PO PACK
17.0000 g | PACK | Freq: Every day | ORAL | Status: DC | PRN
  Administered 2021-04-30: 17 g
  Filled 2021-04-25: qty 1

## 2021-04-25 MED ORDER — HEPARIN (PORCINE) 25000 UT/250ML-% IV SOLN
1050.0000 [IU]/h | INTRAVENOUS | Status: DC
  Administered 2021-04-26 – 2021-05-02 (×7): 1050 [IU]/h via INTRAVENOUS
  Filled 2021-04-25 (×7): qty 250

## 2021-04-25 NOTE — Progress Notes (Signed)
[facility] for Heparin Indication: atrial fibrillation  Allergies  Allergen Reactions   Crestor [Rosuvastatin Calcium] Other (See Comments)    Shoulder pain   Crestor [Rosuvastatin]     Other reaction(s): Unknown   Lipitor [Atorvastatin Calcium] Other (See Comments)    Myalgias    Pravachol [Pravastatin] Other (See Comments)    myalgias   Penicillins Itching    Has patient had a PCN reaction causing immediate rash, facial/tongue/throat swelling, SOB or lightheadedness with hypotension: No Has patient had a PCN reaction causing severe rash involving mucus membranes or skin necrosis: No Has patient had a PCN reaction that required hospitalization No Has patient had a PCN reaction occurring within the last 10 years: No If all of the above answers are "NO", then may proceed with Cephalosporin use.     Patient Measurements: Height: 5' 2"  (157.5 cm) Weight: 77.7 kg (171 lb 4.8 oz) IBW/kg (Calculated) : 50.1 Heparin Dosing Weight: 67 kg  Vital Signs: Temp: 99.4 F (37.4 C) (06/22 0400) Temp Source: Axillary (06/22 0400) BP: 109/55 (06/22 0500) Pulse Rate: 95 (06/22 0500)  Labs: Recent Labs     0000 04/22/21 2000 04/23/21 0035 04/23/21 0400 04/24/21 0515 04/24/21 0803 04/24/21 1430 04/24/21 1600 04/24/21 2300 04/25/21 0420  HGB   < >  --  7.7*  --  7.4*  --   --   --   --  7.0*  HCT  --   --  24.0*  --  22.9*  --   --   --   --  22.1*  PLT  --   --  189  --  227  --   --   --   --  190  APTT  --  79*  --    < > 58*  --  56*  --  73* 65*  LABPROT  --   --   --   --   --  20.4*  --   --   --   --   INR  --   --   --   --   --  1.7*  --   --   --   --   HEPARINUNFRC  --  >1.10*  --   --  >1.10*  --   --   --   --  >1.10*  CREATININE  --   --   --    < > 5.63*  --   --  5.36*  --  3.84*   < > = values in this interval not displayed.    Estimated Creatinine Clearance: 11.3 mL/min (A) (by C-G formula based on SCr of 3.84 mg/dL  (H)).  Medications:  Scheduled:   chlorhexidine gluconate (MEDLINE KIT)  15 mL Mouth Rinse BID   Chlorhexidine Gluconate Cloth  6 each Topical Daily   docusate  100 mg Per Tube BID   free water  30 mL Per Tube Q4H   hydrocortisone sod succinate (SOLU-CORTEF) inj  50 mg Intravenous BID   insulin aspart  0-15 Units Subcutaneous Q4H   insulin aspart  5 Units Subcutaneous Q4H   insulin glargine  10 Units Subcutaneous Daily   ipratropium  0.5 mg Nebulization Q6H   levalbuterol  0.63 mg Nebulization Q6H   mouth rinse  15 mL Mouth Rinse 10 times per day   multivitamin  15 mL Per Tube Daily   pantoprazole (PROTONIX) IV  40 mg Intravenous Q12H  polyethylene glycol  17 g Per Tube Daily   sodium chloride flush  10-40 mL Intracatheter Q12H   Infusions:    prismasol BGK 4/2.5 500 mL/hr at 04/25/21 0218    prismasol BGK 4/2.5 200 mL/hr at 04/24/21 1600   sodium chloride     sodium chloride 10 mL/hr at 04/25/21 0500   amiodarone 30 mg/hr (04/25/21 0500)   ceFEPime (MAXIPIME) IV Stopped (04/25/21 0433)   dexmedetomidine (PRECEDEX) IV infusion Stopped (04/24/21 1810)   feeding supplement (VITAL HIGH PROTEIN) 50 mL/hr at 04/24/21 2000   fentaNYL infusion INTRAVENOUS 150 mcg/hr (04/25/21 0500)   heparin 850 Units/hr (04/25/21 0503)   prismasol BGK 4/2.5 1,000 mL/hr at 04/25/21 0208    Assessment: 75 yoF admit 6/16 with ShOB, DOE, recurrent falls, BLE edema. Afib on admit to ED from PCP, RVR PMH: Afib on Apixaban, HTN, GCA/Pred, DM2  Renal fx worsening, Apixaban 37m bid resumed on admit > reduced to 2.545mbid 6/18 > Heparin infusion 6/19 DOAC will falsely elevated Heparin levels, will monitor aPTT and Heparin levels - till they correlate.  04/25/2021 5:20 AM  aPTT 65 subtherapeutic this AM with heparin gtt @ 850 units/hr HL remains falsely elevated at > 1.1 Hgb down to 7.0, Plts WNL SCr pending RN reports no bleeding complications and no line issues Minimal blood loss with placement of HD  cath per CCM note   Goal of Therapy:  Heparin level 0.3-0.7 units/ml aPTT 66-102 seconds Monitor platelets by anticoagulation protocol: Yes   Plan:  Increase heparin infusion to 95086mnits/hr Check aPTT 8 hr after heparin rate increased Daily aPTT, HL and CBC  Delainie Chavana, LeaToribio HarbourPharmD 04/25/2021, 5:20 AM

## 2021-04-25 NOTE — TOC Progression Note (Signed)
Transition of Care [facility]) - Progression Note    Patient Details  Name: Leslie Bryant MRN: [id number] Date of Birth: 1940/11/19  Transition of Care [facility]) CM/SW Contact  Leeroy Cha, RN Phone Number: 04/25/2021, 7:59 AM  Clinical Narrative:    Started on crrt on 062122, a. Lines in place,Iv amiodarone started,Iv sedation, Iv abx, iv heparin. No toc needs present at this time.  Expected Discharge Plan: Home/Self Care Barriers to Discharge: Continued Medical Work up  Expected Discharge Plan and Services Expected Discharge Plan: Home/Self Care   Discharge Planning Services: CM Consult   Living arrangements for the past 2 months: Single Family Home                                       Social Determinants of Health (SDOH) Interventions    Readmission Risk Interventions No flowsheet data found.

## 2021-04-25 NOTE — Progress Notes (Signed)
Leslie Bryant for Heparin Indication: atrial fibrillation  Allergies  Allergen Reactions   Crestor [Rosuvastatin Calcium] Other (See Comments)    Shoulder pain   Crestor [Rosuvastatin]     Other reaction(s): Unknown   Lipitor [Atorvastatin Calcium] Other (See Comments)    Myalgias    Pravachol [Pravastatin] Other (See Comments)    myalgias   Penicillins Itching    Has patient had a PCN reaction causing immediate rash, facial/tongue/throat swelling, SOB or lightheadedness with hypotension: No Has patient had a PCN reaction causing severe rash involving mucus membranes or skin necrosis: No Has patient had a PCN reaction that required hospitalization No Has patient had a PCN reaction occurring within the last 10 years: No If all of the above answers are "NO", then may proceed with Cephalosporin use.     Patient Measurements: Height: 5' 2"  (157.5 cm) Weight: 82.5 kg (181 lb 14.1 oz) IBW/kg (Calculated) : 50.1 Heparin Dosing Weight: 67 kg  Vital Signs: Temp: 99.3 F (37.4 C) (06/22 1215) Temp Source: Oral (06/22 1215) BP: 120/61 (06/22 1300) Pulse Rate: 114 (06/22 1300)  Labs: Recent Labs     0000 04/22/21 2000 04/23/21 0035 04/23/21 0400 04/24/21 0515 04/24/21 0803 04/24/21 1430 04/24/21 1600 04/24/21 2300 04/25/21 0420  HGB   < >  --  7.7*  --  7.4*  --   --   --   --  7.0*  HCT  --   --  24.0*  --  22.9*  --   --   --   --  22.1*  PLT  --   --  189  --  227  --   --   --   --  190  APTT  --  79*  --    < > 58*  --  56*  --  73* 65*  LABPROT  --   --   --   --   --  20.4*  --   --   --   --   INR  --   --   --   --   --  1.7*  --   --   --   --   HEPARINUNFRC  --  >1.10*  --   --  >1.10*  --   --   --   --  >1.10*  CREATININE  --   --   --    < > 5.63*  --   --  5.36*  --  3.84*   < > = values in this interval not displayed.    Estimated Creatinine Clearance: 11.6 mL/min (A) (by C-G formula based on SCr of 3.84 mg/dL  (H)).  Medications:  Scheduled:   sodium chloride   Intravenous Once   amiodarone  200 mg Per Tube Daily   chlorhexidine gluconate (MEDLINE KIT)  15 mL Mouth Rinse BID   Chlorhexidine Gluconate Cloth  6 each Topical Daily   insulin aspart  0-15 Units Subcutaneous Q4H   insulin aspart  5 Units Subcutaneous Q4H   insulin glargine  10 Units Subcutaneous Daily   ipratropium  0.5 mg Nebulization Q6H   levalbuterol  0.63 mg Nebulization Q6H   mouth rinse  15 mL Mouth Rinse 10 times per day   multivitamin  15 mL Per Tube Daily   pantoprazole sodium  40 mg Per Tube BID   predniSONE  10 mg Per Tube BID WC   sodium chloride flush  10-40 mL Intracatheter Q12H   Infusions:    prismasol BGK 4/2.5 800 mL/hr at 04/25/21 1050   sodium chloride     sodium chloride 10 mL/hr at 04/25/21 1300   amiodarone 30 mg/hr (04/25/21 1300)   dexmedetomidine (PRECEDEX) IV infusion Stopped (04/24/21 1810)   feeding supplement (VITAL HIGH PROTEIN) 50 mL/hr at 04/25/21 1100   fentaNYL infusion INTRAVENOUS 150 mcg/hr (04/25/21 1300)   heparin 950 Units/hr (04/25/21 1300)   prismasol BGK 2/2.5 replacement solution 500 mL/hr at 04/25/21 0838   prismasol BGK 4/2.5 1,000 mL/hr at 04/25/21 1217    Assessment: 19 yoF admit 6/16 with ShOB, DOE, recurrent falls, BLE edema. PMH includes Afib on apixaban.  Due to worsening renal function, she was changed to heparin per pharmacy on 6/19.  She started CRRT on 6/21. Recent DOAC and AKI will falsely elevate Heparin levels; monitor and titrate heparin dose using aPTT until levels correlate.  Today, 04/25/2021:  aPTT 64 remains subtherapeutic on heparin 950 units/hr HL remains falsely elevated at > 1.1 this morning. Hgb down to 7.0, and was transfused 1 unit PRBC.  Plts WNL SCr down to 3.84 RN reports no bleeding complications and no line issues.   Goal of Therapy:  Heparin level 0.3-0.7 units/ml aPTT 66-102 seconds Monitor platelets by anticoagulation protocol: Yes    Plan:  Increase heparin infusion to 1050 units/hr Check aPTT 8 hr after heparin rate increased Daily aPTT, HL and CBC  Gretta Arab PharmD, BCPS Clinical Pharmacist [facility] main pharmacy [phone] 04/25/2021 1:48 PM

## 2021-04-25 NOTE — Progress Notes (Signed)
Admit: 04/13/2021 LOS: 6  70F nl GFR with anuric AKI on CRRT in setting of CAP/ARDS/VDRF, shock now off pressors, hx/o giant cell arteritis  Subjective:  Started CRRT yesterday after CCM placed Temp HD cath K 5.4 this AM, moved to 2K post replacement fluid, P is ok Off pressors, tol UF 20m/h net neg Daughter at bedside NoUOP 60% FIO2 Some concern not waking up and following commands  06/21 0701 - 06/22 0700 In: 2589.5 [I.V.:1049.4; NG/GT:1340; IV Piggyback:200.1] Out: 2242 [Urine:12]  Filed Weights   04/20/21 0600 04/21/21 0216 04/25/21 0500  Weight: 74.7 kg 77.7 kg 82.5 kg    Scheduled Meds:  sodium chloride   Intravenous Once   amiodarone  200 mg Per Tube Daily   chlorhexidine gluconate (MEDLINE KIT)  15 mL Mouth Rinse BID   Chlorhexidine Gluconate Cloth  6 each Topical Daily   insulin aspart  0-15 Units Subcutaneous Q4H   insulin aspart  5 Units Subcutaneous Q4H   insulin glargine  10 Units Subcutaneous Daily   ipratropium  0.5 mg Nebulization Q6H   levalbuterol  0.63 mg Nebulization Q6H   mouth rinse  15 mL Mouth Rinse 10 times per day   multivitamin  15 mL Per Tube Daily   pantoprazole sodium  40 mg Per Tube BID   predniSONE  10 mg Per Tube BID WC   sodium chloride flush  10-40 mL Intracatheter Q12H   Continuous Infusions:   prismasol BGK 4/2.5 800 mL/hr at 04/25/21 1050   sodium chloride     sodium chloride 10 mL/hr at 04/25/21 1100   amiodarone 30 mg/hr (04/25/21 1100)   dexmedetomidine (PRECEDEX) IV infusion Stopped (04/24/21 1810)   feeding supplement (VITAL HIGH PROTEIN) 50 mL/hr at 04/25/21 1100   fentaNYL infusion INTRAVENOUS 125 mcg/hr (04/25/21 1122)   heparin 950 Units/hr (04/25/21 1100)   prismasol BGK 2/2.5 replacement solution 500 mL/hr at 04/25/21 0838   prismasol BGK 4/2.5 1,000 mL/hr at 04/25/21 1217   PRN Meds:.Place/Maintain arterial line **AND** sodium chloride, acetaminophen, acetaminophen, docusate, fentaNYL, heparin, heparin, midazolam,  polyethylene glycol, sodium chloride flush  Current Labs: reviewed  6/16 UA w/o hematuria, trace protein 6/19 Renal [place] HN  Physical Exam:  Blood pressure (!) 126/54, pulse (!) 113, temperature 98.9 F (37.2 C), temperature source Oral, resp. rate 18, height 5' 2"  (1.575 m), weight 82.5 kg, SpO2 (!) 87 %. Intubated sedated Tachy IRIR Coarse bs b/l 1+ LEE S/nt/nd No rashes/lesions ETT in place, NCAT  A Anuric AKI, likely ATN in setting of #2 and #3. Given prolonged anuric state, progressive azotemia, and hypervolemia started CRRT 6/21 after discussing with family.  Tolerated so far, not clotting. UF 50-101mh. Systemic heparin for #7.  No evidence of GFR recovery.  ARDS/CAP/VDRF: per CCM,  cont with UF with CRRT Septic Shock, off pressors Giant Cell arteritis, chronic IS, now on stress dosing Anemia, transfusion per primary, for pRBC today [name] UTI: ABX per CCM AFib on hep gtt Hypervolemia Mild hyperkalemia, CRRT adjusted 2K post bath.   P As above Daily reassessment, if increasingly unlikely for her to make quick improvements family would want to rediscuss GODunlapeasonable to think supportive care and time can give some GFR recovery, but not yet  RyPearson GrippeD 04/25/2021, 12:29 PM  Recent Labs  Lab 04/24/21 0515 04/24/21 1600 04/25/21 0420  NA 135 134* 135  K 4.6 5.0 5.4*  CL 103 101 102  CO2 21* 20* 23  GLUCOSE 275* 255* 194*  BUN 96* 106* 79*  CREATININE 5.63* 5.36* 3.84*  CALCIUM 8.0* 7.9* 7.8*  PHOS 5.6* 4.9* 3.6  3.6    Recent Labs  Lab 05/02/2021 1527 04/20/21 0248 04/23/21 0035 04/24/21 0515 04/25/21 0420  WBC 15.7*   < > 15.1* 15.5* 16.4*  NEUTROABS 14.8*  --   --   --   --   HGB 10.9*   < > 7.7* 7.4* 7.0*  HCT 31.1*   < > 24.0* 22.9* 22.1*  MCV 92.3   < > 100.8* 100.4* 101.8*  PLT 181   < > 189 227 190   < > = values in this interval not displayed.

## 2021-04-25 NOTE — Progress Notes (Signed)
[facility] Progress Note Patient Name: Leslie Bryant DOB: Mar 09, 1941 MRN: [id number]   Date of Service  04/25/2021  HPI/Events of Note  Multiple issues: 1. Hyperkalemia - K+ = 5.4. Patient on CRRT. 2. Anemia - Hgb = 7.0.  eICU Interventions  Plan: Bedside nurse to contact Nephrology service to decrease K+ in CRRT, Transfuse 1 unit PRBC now.      Intervention Category Major Interventions: Other:;Electrolyte abnormality - evaluation and management  Lysle Dingwall 04/25/2021, 5:33 AM

## 2021-04-25 NOTE — Progress Notes (Signed)
NAME:  Leslie Bryant MRN:  [id number] DOB:  14-Aug-1941 LOS: 6 ADMISSION DATE:  04/24/2021 CONSULTATION DATE: 04/20/2021 REFERRING MD:  Alfredia Ferguson - TRH CHIEF COMPLAINT:  SOB, hypoxic respiratory failure   Brief Narrative   80 year old female presented with presented to [facility] 6/16 via EMS for worsening SOB, hypoxia and concern for PNA.On ED evaluation patient was hypoxic on RA and in A-fib RVR .CXR demonsrated bilateral lower lobe infiltrates. Labs were notable for Na 123, BNP 467, Trop 20 and WBC 15K. Patient was admitted to [facility].  PCCM consulted 6/17 for ongoing hypoxemic respiratory failure despite supplemental O2.  Pertinent Medical History:  A-fib Right breast cancer  Bowel perforation  HLD  Significant Hospital Events: Including procedures, antibiotic start and stop dates in addition to other pertinent events   6/16 - Admitted to [facility] for SOB, hypoxia, multifocal PNA 6/17 - PCCM consulted for persistent hypoxia, mild lethargy, ICU admission -> intubated later in day. CT chest with bilateral diffuse GGO.  Failed PICC line insertion 6/18 AM - worsening AKI - creat 1.8. on vent 80% fio2, supnine. Sedated with fent gtt, versed gtt. On pressors neo. PCT high but culture negative so far. HS trop in 20s. RASS -4. On protoni and coffee ground mild returns noted this morning 6/18 PM - -in renal failure. Worsening creat. Dropping Ur op despite fluids. Bronch BAL -> 86% polys.  Alv  hge ruled out.  Elliquis now lower dose. Code changed to DNR + full medical care 6/20 Remains sedated on vent minimally responsive on fentanyl drip with worsening renal failure, now off pressors 6/21 progressive multi - organ dysfunction including worsening renal failure, mild liver injury with slight transaminitis, and continued encephalopathy  Interim History / Subjective:  No acute events overnight Approximately 2 L removed with initiation of CRRT mild hyperkalemia  Objective:  Blood pressure (!) 110/46, pulse (!) 157,  temperature 99.4 F (37.4 C), temperature source Axillary, resp. rate 16, height _0  (1.575 m), weight 82.5 kg, SpO2 92 %.    Vent Mode: PRVC FiO2 (%):  [60 %] 60 % Set Rate:  [35 bmp] 35 bmp Vt Set:  [350 mL] 350 mL PEEP:  [10 cmH20-12 cmH20] 12 cmH20 Plateau Pressure:  [23 cmH20-31 cmH20] 31 cmH20   Intake/Output Summary (Last 24 hours) at 04/25/2021 0708 Last data filed at 04/25/2021 0700 Gross per 24 hour  Intake 2589.46 ml  Output 2242 ml  Net 347.46 ml    Filed Weights   04/20/21 0600 04/21/21 0216 04/25/21 0500  Weight: 74.7 kg 77.7 kg 82.5 kg    Physical Exam General: Acute on chronic ill-appearing elderly female lying in bed and in no acute distress HEENT: ETT, MM pink/moist, PERRL,  Neuro: Sedated on ventilator CV: Irregular rate and rhythm, tachycardia no murmur, rubs, or gallops,  PULM: Clear to auscultation bilaterally, no added breath sounds, tolerating ventilator GI: soft, bowel sounds active in all 4 quadrants, non-tender, non-distended, tolerating TF Extremities: warm/dry, generalized 1-2+ pitting edema  Skin: no rashes or lesions  Labs/imaging that I have personally reviewed:  ECHO - RV moderately enlarged and some reduction in RVEF (baseline eliquis for A Fib). 6/20 Pertinent labs: glucose 285, creatinine 4.56, BUN 78, phos 5.5, albumin 1.8, WBC 15.1,   Resolved Hospital Problem List:   Septic shock -Pressors stopped 6/19  Assessment & Plan:   Acute metabolic encephalopathy Hx of falls with "Battle Eyes" prior to admission  Baseline hx of Giant Cell Arteritis on chronic steroids - CT  head 04/16/21 - negative P:   Neuro protective measures Nutrition and bowel regiment  Seizure precautions  Aspirations precautions  Minimize sedation  Fentanyl drip per protocol   Mild new onset RV failure at admit 04/20/21.  -Unlikely PE due to baseline eliquis -EF 55-60% with moderate reduce RV function  Hx of A Fib on amio currently in A-fib -on eliquis (amio  held at admit, cardizem stopped 6/17 pre intubation) P: Continue IV Amiodarone  Daily weight  Strict intake and output  Continuous telemetry  Optimize electrolytes  Continuous telemetry   Severe Acute Respiratory Failure due to ARDS -Deterioration 6/17 resulting in intubation P: Continue ventilator support with lung protective strategies  PAD protocol to ensure vent compliance and comfort  Wean PEEP and FiO2 for sats greater than 90%. Head of bed elevated 30 degrees Plateau pressures less than 30 cm H20 Follow intermittent chest x-ray and ABG SAT/SBT as tolerated, mentation preclude extubation  Ensure adequate pulmonary hygiene  PAD protocol  Mild transaminates  -LFTs noted to be elevated slightly on the mend 6/21, concern for progressive of multiorgan dysfunction  At risk malnutrition -In the setting of prolonged critical illness P: Repeat LFTs in AM  Avoid hepatotoxins  Continue tube feeds Continue PPI for stress ulcer prevention   AKI POA now with progression to oliguric renal failure  -Baseline creat 0.7m 04/16/21, creatinine now uptrended to 4.56, GFR 9 -Renal [place] -~2L off in the last 24hrs with CRRT  P: Nephrology following appreciate assistance  Follow renal function  Monitor urine output Trend Bmet Avoid nephrotoxins Ensure adequate renal perfusion   Baesline mild anemia prior to admit  -No alveolar hemorrhage on bronchoscopy 04/21/2021 P: Trend CBC  Monitor for signs of bleeding  Transfuse per protocol  Continue heparin drip, plt and CBC remains stable  Follow heparin level   Hyperglycemia with likely progression to type 2 diabetes -Patient has been persistently hyperglycemic this admission.  It appears glucose has remained elevated since starting steroids for giant cell arteritis.  Most recent hemoglobin A1c 9.1 Baseline immunosuppressed due to chronic steroids P: Continue on SSI, long acting insulin, and TF coverage regiment  CBG checks  q4hrs CBG goal 140-180 Wean stress dose steroids   Klebsiella UTI, POA -Resistant to ampicillin only -Ceftriaxone 6/16 > 6/18 -Ceftriaxone 6/16 > 6/17 P: Remains on IV Cefepime   Hyperkalemia  P: Trend Bmet  Adjust CRRT fluid  Best Practice   Diet/type: tubefeeds Pain/Anxiety/Delirium protocol RASS goal: 0 to -1 VAP protocol (if indicated): Yes DVT prophylaxis: systemic heparin GI prophylaxis: PPI Glucose control:  SSI - increased due to poor glycemic control  Central venous access:  Yes, and it is still needed  right IJ HD and Left fem CVC, if able to get PIV access with D/C fem line Arterial line:  Yes, and it is no longer needed and removal ordered  Foley:  Yes, and it is still needed Mobility:  bed rest  PT consulted: N/A Studies pending: None Culture data pending:none Last reviewed culture data:today Antibiotics:cefepime Antibiotic de-escalation: yes, de-escalation ordered.  Stop date: to be determined  Daily labs: ordered Code Status:  DNR Last date of multidisciplinary goals of care discussion:Update family daily,  ccm prognosis: Life-threating Disposition: remains critically ill, will stay in intensive care   Critical care time:    Performed by: Sony Schlarb D. Harris  Total critical care time: 37 minutes  Critical care time was exclusive of separately billable procedures and treating other patients.  Critical care  was necessary to treat or prevent imminent or life-threatening deterioration.  Critical care was time spent personally by me on the following activities: development of treatment plan with patient and/or surrogate as well as nursing, discussions with consultants, evaluation of patient's response to treatment, examination of patient, obtaining history from patient or surrogate, ordering and performing treatments and interventions, ordering and review of laboratory studies, ordering and review of radiographic studies, pulse oximetry and re-evaluation of  patient's condition.  Jmari Pelc D. Kenton Kingfisher, NP-C [facility] Personal contact information can be found on Amion  04/25/2021, 7:08 AM

## 2021-04-25 NOTE — Progress Notes (Signed)
PHARMACY - HEPARIN (brief note)  Patient on IV heparin gtt @ 850 units/hr for atrial fibrillation  aPTT = 73 sec (goal 66-102 sec) - therapeutic No line issues and no bleeding complications reported by RN  Plan: Continue IV heparin @ 850 units/hr Daily Heparin level, aPTT and CBC  Leone Haven, PharmD 04/25/21 @ 00:34

## 2021-04-26 ENCOUNTER — Inpatient Hospital Stay (HOSPITAL_COMMUNITY): Payer: Medicare Other

## 2021-04-26 LAB — GLUCOSE, CAPILLARY
Glucose-Capillary: 175 mg/dL — ABNORMAL HIGH (ref 70–99)
Glucose-Capillary: 158 mg/dL — ABNORMAL HIGH (ref 70–99)
Glucose-Capillary: 124 mg/dL — ABNORMAL HIGH (ref 70–99)
Glucose-Capillary: 132 mg/dL — ABNORMAL HIGH (ref 70–99)
Glucose-Capillary: 145 mg/dL — ABNORMAL HIGH (ref 70–99)
Glucose-Capillary: 157 mg/dL — ABNORMAL HIGH (ref 70–99)

## 2021-04-26 LAB — RENAL FUNCTION PANEL
Albumin: 1.7 g/dL — ABNORMAL LOW (ref 3.5–5.0)
Albumin: 1.7 g/dL — ABNORMAL LOW (ref 3.5–5.0)
Anion gap: 6 (ref 5–15)
Anion gap: 8 (ref 5–15)
BUN: 45 mg/dL — ABNORMAL HIGH (ref 8–23)
BUN: 35 mg/dL — ABNORMAL HIGH (ref 8–23)
CO2: 27 mmol/L (ref 22–32)
CO2: 27 mmol/L (ref 22–32)
Calcium: 7.9 mg/dL — ABNORMAL LOW (ref 8.9–10.3)
Calcium: 8.3 mg/dL — ABNORMAL LOW (ref 8.9–10.3)
Chloride: 102 mmol/L (ref 98–111)
Chloride: 101 mmol/L (ref 98–111)
Creatinine, Ser: 1.65 mg/dL — ABNORMAL HIGH (ref 0.44–1.00)
Creatinine, Ser: 1.52 mg/dL — ABNORMAL HIGH (ref 0.44–1.00)
GFR, Estimated: 31 mL/min — ABNORMAL LOW (ref >60)
GFR, Estimated: 34 mL/min — ABNORMAL LOW (ref >60)
Glucose, Bld: 174 mg/dL — ABNORMAL HIGH (ref 70–99)
Glucose, Bld: 143 mg/dL — ABNORMAL HIGH (ref 70–99)
Phosphorus: 3.1 mg/dL (ref 2.5–4.6)
Phosphorus: 3.7 mg/dL (ref 2.5–4.6)
Potassium: 5.4 mmol/L — ABNORMAL HIGH (ref 3.5–5.1)
Potassium: 5.2 mmol/L — ABNORMAL HIGH (ref 3.5–5.1)
Sodium: 135 mmol/L (ref 135–145)
Sodium: 136 mmol/L (ref 135–145)

## 2021-04-26 LAB — ACID FAST SMEAR (AFB, MYCOBACTERIA): Acid Fast Smear: NEGATIVE

## 2021-04-26 LAB — TYPE AND SCREEN
ABO/RH(D): O POS
Antibody Screen: NEGATIVE
Unit division: 0

## 2021-04-26 LAB — CBC
HCT: 27.6 % — ABNORMAL LOW (ref 36.0–46.0)
Hemoglobin: 8.7 g/dL — ABNORMAL LOW (ref 12.0–15.0)
MCH: 32 pg (ref 26.0–34.0)
MCHC: 31.5 g/dL (ref 30.0–36.0)
MCV: 101.5 fL — ABNORMAL HIGH (ref 80.0–100.0)
Platelets: 172 10*3/uL (ref 150–400)
RBC: 2.72 MIL/uL — ABNORMAL LOW (ref 3.87–5.11)
RDW: 17.4 % — ABNORMAL HIGH (ref 11.5–15.5)
WBC: 15.7 10*3/uL — ABNORMAL HIGH (ref 4.0–10.5)
nRBC: 5.1 % — ABNORMAL HIGH (ref 0.0–0.2)

## 2021-04-26 LAB — BPAM RBC
Blood Product Expiration Date: 202207252359
ISSUE DATE / TIME: 202206220858
Unit Type and Rh: 5100

## 2021-04-26 LAB — BLOOD GAS, ARTERIAL
Acid-base deficit: 0 mmol/L (ref 0.0–2.0)
Acid-base deficit: 0.5 mmol/L (ref 0.0–2.0)
Bicarbonate: 27.1 mmol/L (ref 20.0–28.0)
Bicarbonate: 26.9 mmol/L (ref 20.0–28.0)
Drawn by: 313941
FIO2: 60
MECHVT: 350 mL
O2 Saturation: 89.5 %
O2 Saturation: 98.6 %
PEEP: 12 cmH2O
Patient temperature: 98
Patient temperature: 98.6
RATE: 35 resp/min
pCO2 arterial: 58.4 mmHg — ABNORMAL HIGH (ref 32.0–48.0)
pCO2 arterial: 61.6 mmHg — ABNORMAL HIGH (ref 32.0–48.0)
pH, Arterial: 7.286 — ABNORMAL LOW (ref 7.350–7.450)
pH, Arterial: 7.263 — ABNORMAL LOW (ref 7.350–7.450)
pO2, Arterial: 61.8 mmHg — ABNORMAL LOW (ref 83.0–108.0)
pO2, Arterial: 138 mmHg — ABNORMAL HIGH (ref 83.0–108.0)

## 2021-04-26 LAB — HEPATIC FUNCTION PANEL
ALT: 71 U/L — ABNORMAL HIGH (ref 0–44)
AST: 107 U/L — ABNORMAL HIGH (ref 15–41)
Albumin: 1.7 g/dL — ABNORMAL LOW (ref 3.5–5.0)
Alkaline Phosphatase: 120 U/L (ref 38–126)
Bilirubin, Direct: 0.2 mg/dL (ref 0.0–0.2)
Indirect Bilirubin: 0.3 mg/dL (ref 0.3–0.9)
Total Bilirubin: 0.5 mg/dL (ref 0.3–1.2)
Total Protein: 6.3 g/dL — ABNORMAL LOW (ref 6.5–8.1)

## 2021-04-26 LAB — HEPARIN LEVEL (UNFRACTIONATED): Heparin Unfractionated: >1.1 IU/mL — ABNORMAL HIGH (ref 0.30–0.70)

## 2021-04-26 LAB — MAGNESIUM: Magnesium: 2.7 mg/dL — ABNORMAL HIGH (ref 1.7–2.4)

## 2021-04-26 LAB — APTT: aPTT: 96 seconds — ABNORMAL HIGH (ref 24–36)

## 2021-04-26 MED ORDER — CISATRACURIUM BESYLATE (PF) 10 MG/5ML IV SOLN
0.1000 mg/kg | INTRAVENOUS | Status: DC | PRN
  Administered 2021-04-26 – 2021-04-27 (×2): 8 mg via INTRAVENOUS
  Filled 2021-04-26 (×3): qty 4

## 2021-04-26 MED ORDER — ARTIFICIAL TEARS OPHTHALMIC OINT
1.0000 "application " | TOPICAL_OINTMENT | Freq: Three times a day (TID) | OPHTHALMIC | Status: DC
  Administered 2021-04-26 – 2021-04-27 (×4): 1 via OPHTHALMIC
  Filled 2021-04-26 (×2): qty 3.5

## 2021-04-26 MED ORDER — VITAL 1.5 CAL PO LIQD
1000.0000 mL | ORAL | Status: DC
  Administered 2021-04-26 – 2021-05-05 (×8): 1000 mL
  Filled 2021-04-26 (×11): qty 1000

## 2021-04-26 MED ORDER — PROSOURCE TF PO LIQD
45.0000 mL | Freq: Four times a day (QID) | ORAL | Status: DC
  Administered 2021-04-26 – 2021-05-01 (×19): 45 mL
  Filled 2021-04-26 (×19): qty 45

## 2021-04-26 MED ORDER — METOPROLOL TARTRATE 25 MG/10 ML ORAL SUSPENSION
25.0000 mg | Freq: Two times a day (BID) | ORAL | Status: DC
  Administered 2021-04-26 – 2021-05-01 (×10): 25 mg
  Filled 2021-04-26 (×12): qty 10

## 2021-04-26 MED ORDER — PRISMASOL BGK 0/2.5 32-2.5 MEQ/L EC SOLN

## 2021-04-26 NOTE — Progress Notes (Addendum)
RT assisted with head turn to the right at this time with no complications

## 2021-04-26 NOTE — Progress Notes (Signed)
[facility] for Heparin Indication: atrial fibrillation  Allergies  Allergen Reactions   Crestor [Rosuvastatin Calcium] Other (See Comments)    Shoulder pain   Crestor [Rosuvastatin]     Other reaction(s): Unknown   Lipitor [Atorvastatin Calcium] Other (See Comments)    Myalgias    Pravachol [Pravastatin] Other (See Comments)    myalgias   Penicillins Itching    Has patient had a PCN reaction causing immediate rash, facial/tongue/throat swelling, SOB or lightheadedness with hypotension: No Has patient had a PCN reaction causing severe rash involving mucus membranes or skin necrosis: No Has patient had a PCN reaction that required hospitalization No Has patient had a PCN reaction occurring within the last 10 years: No If all of the above answers are "NO", then may proceed with Cephalosporin use.     Patient Measurements: Height: 5' 2"  (157.5 cm) Weight: 80.1 kg (176 lb 9.4 oz) IBW/kg (Calculated) : 50.1 Heparin Dosing Weight: 67 kg  Vital Signs: Temp: 98.7 F (37.1 C) (06/23 0400) Temp Source: Axillary (06/23 0400) BP: 101/42 (06/23 0700) Pulse Rate: 97 (06/23 0700)  Labs: Recent Labs    04/24/21 0515 04/24/21 0803 04/24/21 1430 04/25/21 0420 04/25/21 1333 04/25/21 1533 04/25/21 2330 04/26/21 0415  HGB 7.4*  --   --  7.0*  --   --   --  8.7*  HCT 22.9*  --   --  22.1*  --   --   --  27.6*  PLT 227  --   --  190  --   --   --  172  APTT 58*  --    < > 65* 64*  --  92* 96*  LABPROT  --  20.4*  --   --   --   --   --   --   INR  --  1.7*  --   --   --   --   --   --   HEPARINUNFRC >1.10*  --   --  >1.10*  --   --   --  >1.10*  CREATININE 5.63*  --    < > 3.84*  --  2.33*  --  1.65*   < > = values in this interval not displayed.    Estimated Creatinine Clearance: 26.7 mL/min (A) (by C-G formula based on SCr of 1.65 mg/dL (H)).  Medications:  Scheduled:   sodium chloride   Intravenous Once   amiodarone  200 mg Per Tube  Daily   chlorhexidine gluconate (MEDLINE KIT)  15 mL Mouth Rinse BID   Chlorhexidine Gluconate Cloth  6 each Topical Daily   insulin aspart  0-15 Units Subcutaneous Q4H   insulin aspart  5 Units Subcutaneous Q4H   insulin glargine  10 Units Subcutaneous Daily   ipratropium  0.5 mg Nebulization Q6H   levalbuterol  0.63 mg Nebulization Q6H   mouth rinse  15 mL Mouth Rinse 10 times per day   multivitamin  15 mL Per Tube Daily   pantoprazole sodium  40 mg Per Tube BID   predniSONE  10 mg Per Tube BID WC   sodium chloride flush  10-40 mL Intracatheter Q12H   Infusions:    prismasol BGK 4/2.5 800 mL/hr at 04/26/21 0554   sodium chloride     sodium chloride 10 mL/hr at 04/26/21 0700   amiodarone Stopped (04/25/21 1321)   dexmedetomidine (PRECEDEX) IV infusion Stopped (04/24/21 1810)   feeding supplement (VITAL HIGH PROTEIN)  1,000 mL (04/25/21 1706)   fentaNYL infusion INTRAVENOUS 150 mcg/hr (04/26/21 0700)   heparin 1,050 Units/hr (04/26/21 0700)   midazolam 2 mg/hr (04/26/21 0700)   prismasol BGK 2/2.5 replacement solution 500 mL/hr at 04/26/21 0515   prismasol BGK 4/2.5 1,000 mL/hr at 04/26/21 0315    Assessment: Leslie Bryant admit 6/16 with ShOB, DOE, recurrent falls, BLE edema. PMH includes Afib on apixaban.  Due to worsening renal function, she was changed to heparin per pharmacy on 6/19.  She started CRRT on 6/21. Recent DOAC and AKI will falsely elevate Heparin levels; monitor and titrate heparin dose using aPTT until levels correlate.  Today, 04/26/2021:  aPTT 96 therapeutic for second time on heparin 1050 units/hr HL remains falsely elevated at > 1.1 this morning. Hgb up 8.7 after transfusion, Plts WNL SCr down to 1.65 RN reports no bleeding complications and no line issues.   Goal of Therapy:  Heparin level 0.3-0.7 units/ml aPTT 66-102 seconds Monitor platelets by anticoagulation protocol: Yes   Plan:  Continue heparin infusion at 1050 units/hr Daily aPTT, HL and  CBC  Delano Frate P. Legrand Como, PharmD, [place] Please utilize Amion for appropriate phone number to reach the unit pharmacist ([place]) 04/26/2021 7:20 AM

## 2021-04-26 NOTE — Progress Notes (Addendum)
RT assisted with head turn to the left at this time with no complications.

## 2021-04-26 NOTE — Progress Notes (Signed)
Admit: 04/22/2021 LOS: 7  [name] nl GFR with anuric AKI on CRRT in setting of CAP/ARDS/VDRF, shock now off pressors, hx/o giant cell arteritis  Subjective:  Started CRRT 6/21 after CCM placed Temp HD cath K 5.4 this AM, moved to 2K post replacement fluid yesterday,  P is ok, will await evening labs Off pressors, tol UF 27m/h net neg-- minus 1800 yest  NoUOP 50% FIO2-  down from 60-  she is prone at present    06/22 0701 - 06/23 0700 In: 2598.7 [I.V.:963.7; Blood:315; NG/GT:1320] Out: 4382.9   Filed Weights   04/21/21 0216 04/25/21 0500 04/26/21 0423  Weight: 77.7 kg 82.5 kg 80.1 kg    Scheduled Meds:  sodium chloride   Intravenous Once   amiodarone  200 mg Per Tube Daily   artificial tears  1 application Both Eyes QI7O  chlorhexidine gluconate (MEDLINE KIT)  15 mL Mouth Rinse BID   Chlorhexidine Gluconate Cloth  6 each Topical Daily   feeding supplement (PROSource TF)  45 mL Per Tube QID   insulin aspart  0-15 Units Subcutaneous Q4H   insulin aspart  5 Units Subcutaneous Q4H   insulin glargine  10 Units Subcutaneous Daily   ipratropium  0.5 mg Nebulization Q6H   levalbuterol  0.63 mg Nebulization Q6H   mouth rinse  15 mL Mouth Rinse 10 times per day   multivitamin  15 mL Per Tube Daily   pantoprazole sodium  40 mg Per Tube BID   predniSONE  10 mg Per Tube BID WC   sodium chloride flush  10-40 mL Intracatheter Q12H   Continuous Infusions:   prismasol BGK 4/2.5 800 mL/hr at 04/26/21 1208   sodium chloride     sodium chloride 10 mL/hr at 04/26/21 1500   amiodarone Stopped (04/25/21 1321)   feeding supplement (VITAL 1.5 CAL) 1,000 mL (04/26/21 1514)   fentaNYL infusion INTRAVENOUS 150 mcg/hr (04/26/21 1500)   heparin 1,050 Units/hr (04/26/21 1500)   midazolam 2 mg/hr (04/26/21 1500)   prismasol BGK 2/2.5 replacement solution 500 mL/hr at 04/26/21 1544   prismasol BGK 4/2.5 1,000 mL/hr at 04/26/21 1404   PRN Meds:.Place/Maintain arterial line **AND** sodium chloride,  acetaminophen, acetaminophen, cisatracurium (NIMBEX) injection, docusate, fentaNYL, heparin, heparin, midazolam **AND** midazolam, polyethylene glycol, sodium chloride flush  Current Labs: reviewed  6/16 UA w/o hematuria, trace protein 6/19 Renal [place] HN  Physical Exam:  Blood pressure 135/62, pulse (!) 135, temperature 97.6 F (36.4 C), temperature source Axillary, resp. rate (!) 31, height _0  (1.575 m), weight 80.1 kg, SpO2 98 %. Intubated sedated Tachy IRIR Coarse bs b/l 1+ LEE S/nt/nd No rashes/lesions ETT in place, NCAT  A Anuric AKI, likely ATN in setting of #2 and #3. Given prolonged anuric state, progressive azotemia, and hypervolemia started CRRT 6/21 after discussing with family.  Tolerated so far, not clotting. UF 50-102mh. Systemic heparin for #7.  No evidence of GFR recovery.  ARDS/CAP/VDRF: per CCM,  cont with UF with CRRT-  need to continue CRRT given tenuous state  Septic Shock, off pressors Giant Cell arteritis, chronic IS, now on stress dosing Anemia, transfusion per primary, for pRBC 6/22 [name] UTI: ABX per CCM AFib on hep gtt Hypervolemia Mild hyperkalemia, CRRT adjusted 2K post bath-  back up this AM-  awaiting pm lab.   P  Daily reassessment, if increasingly unlikely for her to make quick improvements family would want to rediscuss [place] to think supportive care and time can give some  GFR recovery, but not yet  Leslie Bryant  04/26/2021, 3:48 PM  Recent Labs  Lab 04/25/21 0420 04/25/21 1533 04/26/21 0415  NA 135 136 135  K 5.4* 4.8 5.4*  CL 102 104 102  CO2 _0 GLUCOSE 194* 151* 174*  BUN 79* 59* 45*  CREATININE 3.84* 2.33* 1.65*  CALCIUM 7.8* 7.9* 7.9*  PHOS 3.6  3.6 2.7 3.1   Recent Labs  Lab 04/24/21 0515 04/25/21 0420 04/26/21 0415  WBC 15.5* 16.4* 15.7*  HGB 7.4* 7.0* 8.7*  HCT 22.9* 22.1* 27.6*  MCV 100.4* 101.8* 101.5*  PLT 227 190 172

## 2021-04-26 NOTE — Progress Notes (Signed)
RT assisted with head turn to the left at this time. No complications.

## 2021-04-26 NOTE — Progress Notes (Signed)
Notified Lab that ABG being sent for analysis. 

## 2021-04-26 NOTE — Progress Notes (Addendum)
NAME:  Leslie Bryant MRN:  [id number] DOB:  Jan 11, 1941 LOS: 7 ADMISSION DATE:  05/01/2021 CONSULTATION DATE: 04/20/2021 REFERRING MD:  Alfredia Ferguson - TRH CHIEF COMPLAINT:  SOB, hypoxic respiratory failure   Brief Narrative   80 year old female presented with presented to [facility] 6/16 via EMS for worsening SOB, hypoxia and concern for PNA.On ED evaluation patient was hypoxic on RA and in A-fib RVR .CXR demonsrated bilateral lower lobe infiltrates. Labs were notable for Na 123, BNP 467, Trop 20 and WBC 15K. Patient was admitted to [facility].  PCCM consulted 6/17 for ongoing hypoxemic respiratory failure despite supplemental O2.  Pertinent Medical History:  A-fib Right breast cancer  Bowel perforation  HLD  Significant Hospital Events: Including procedures, antibiotic start and stop dates in addition to other pertinent events   6/16 - Admitted to [facility] for SOB, hypoxia, multifocal PNA 6/17 - PCCM consulted for persistent hypoxia, mild lethargy, ICU admission -> intubated later in day. CT chest with bilateral diffuse GGO.  Failed PICC line insertion 6/18 AM - worsening AKI - creat 1.8. on vent 80% fio2, supnine. Sedated with fent gtt, versed gtt. On pressors neo. PCT high but culture negative so far. HS trop in 20s. RASS -4. On protoni and coffee ground mild returns noted this morning 6/18 PM - -in renal failure. Worsening creat. Dropping Ur op despite fluids. Bronch BAL -> 86% polys.  Alv  hge ruled out.  Elliquis now lower dose. Code changed to DNR + full medical care 6/19 renal consulted.  6/20 Remains sedated on vent minimally responsive on fentanyl drip with worsening renal failure, now off pressors 6/21 progressive multi - organ dysfunction including worsening renal failure, HD cath placed Right IJ. CRRT started.   mild liver injury with slight transaminitis, and continued encephalopathy. Cefepime completed.  6/22 amiodarone had to be restarted for af w/ RVR  Interim History / Subjective:  Still w/ RVR.  Still on higher O2/PEEP  Objective:  Blood pressure (Abnormal) 101/42, pulse 97, temperature 98.7 F (37.1 C), temperature source Axillary, resp. rate (Abnormal) 33, height _0  (1.575 m), weight 80.1 kg, SpO2 95 %.    Vent Mode: PRVC FiO2 (%):  [60 %-70 %] 70 % Set Rate:  [35 bmp] 35 bmp Vt Set:  [350 mL] 350 mL PEEP:  [12 cmH20] 12 cmH20 Plateau Pressure:  [20 cmH20-30 cmH20] 30 cmH20   Intake/Output Summary (Last 24 hours) at 04/26/2021 0626 Last data filed at 04/26/2021 0700 Gross per 24 hour  Intake 2598.65 ml  Output 4382.9 ml  Net -1784.25 ml   Filed Weights   04/21/21 0216 04/25/21 0500 04/26/21 0423  Weight: 77.7 kg 82.5 kg 80.1 kg    Physical Exam General this is a 80 year old female who is sedated on fent and versed and remains on full vent support HENT orally intubated. Bilateral battle's sign. OGT in place Pulm decreased bilateral. Ppplat 24. Still on 12 peep/fio2 60 Pcxr from 6/21 w/ dense R>L consolidative and interstitial airspace disease  Card reg irreg af on tele Abd soft + bowel sounds Neuro sedated Ext warm. Pitting edema brisk CR GU clear yellow. Minimal UOP  Labs/imaging that I have personally reviewed:  See below   Resolved Hospital Problem List:   Septic shock-Pressors stopped 6/19 Klebsiella UTI, POA Assessment & Plan:   Acute metabolic encephalopathy superimposed on Hx of falls with "Battle Eyes" prior to admission  Baseline hx of Giant Cell Arteritis on chronic steroids - CT head 04/16/21 - negative  Plan PAD protocol RASS goal -4 Supportive care No change in pred dosing  Mild new onset RV failure at admit 04/20/21. ->likely pulm in origin  Hx of A Fib on amio currently in A-fib w/ RVR -on eliquis (amio held at admit, cardizem stopped 6/17 pre intubation) Plan Cont tele Cont amio Heparin gtt Treat resp failure and aid hypoxia F/u echo in out-pt setting   Severe Acute Respiratory Failure due to ARDS in context of CAP (NOS) vs ALI  from UTI.  Baseline immunosuppressed due to chronic steroids -Deterioration 6/17 resulting in intubation Plan Repeat CXR and ABG PAD protocol as above VAP bundle Considering Prone trial.  Get FT post-pyloric  Monitor fever and WBC curve off abx Volume removal w/ CRRT  Mild transaminates  -LFTs noted to be elevated slightly on the mend 6/21, concern for progressive of multiorgan dysfunction  -In the setting of prolonged critical illness-->remain elevated.  Plan Cont to trend   AKI POA now with progression to oliguric renal failure  -Baseline creat 0.49m 04/16/21, -CRRT started 6/21 Plan Cont CRRT per nephro Avoid nephrotoxins I think we can dc her foley  Intermittent Fluid and electrolyte imbalance: hyperkalemia Plan CRRT  Baesline mild anemia prior to admit  -No alveolar hemorrhage on bronchoscopy 04/21/2021. No evidence of bleeding  Plan Trend cbc Tranfuse for hgb < 7    Hyperglycemia with likely progression to type 2 diabetes -Patient has been persistently hyperglycemic this admission.  It appears glucose has remained elevated since starting steroids for giant cell arteritis.  Most recent hemoglobin A1c 9.1 -glycemic control w/in goal Plan Cont ssi and basal coverage    Best Practice   Diet/type: tubefeeds Pain/Anxiety/Delirium protocol RASS goal -4 VAP protocol (if indicated): Yes DVT prophylaxis: systemic heparin GI prophylaxis: PPI Glucose control:  SSI and Basal coverage - increased due to poor glycemic control  Central venous access:  Yes, and it is still needed   CVC, if able to get PIV access with D/C fem line Arterial line:  N/A Foley:  Yes, and it is still needed-->keep while prone but DC ASAP Mobility:  bed rest  PT consulted: N/A Studies pending: None Culture data pending:none Last reviewed culture data:today Antibiotics:not indicated  Antibiotic de-escalation: N/A Stop date: N/A Daily labs: ordered Code Status:  DNR Last date of  multidisciplinary goals of care discussion:Update family daily,  ccm prognosis: Life-threating Disposition: remains critically ill, will stay in intensive care   Critical care time: 32 minutes  PErick ColaceACNP-BC LAnadarkoPager # [phone] # [phone] no answer

## 2021-04-26 NOTE — Progress Notes (Signed)
RT assisted with head turn to the right at this time with no complications.

## 2021-04-26 NOTE — Progress Notes (Signed)
Patients ET tube secured with cloth tape as well as dressings placed behind the neck, on the cheek bones, and under the nose. Assisted in proning patient at this time. Patients FiO2 was placed on 100% during the process. Patients head is turned to the right. Vitals remained stable.

## 2021-04-26 NOTE — Progress Notes (Signed)
RT assisted with head turn to the left at this time with no complications

## 2021-04-26 NOTE — Progress Notes (Signed)
PHARMACY - HEPARIN (brief note)  Patient on IV heparin @ 1050 units/hr for atrial fibrillation.  aPTT = 92 sec (Goal 57-846 sec) No complications of therapy noted  Plan: Continue IV heparin @ 1050 units/hr F/U AM aPTT, HL, CBC  Leone Haven, PharmD

## 2021-04-26 NOTE — Progress Notes (Addendum)
Nutrition Follow-up  DOCUMENTATION CODES:   Obesity unspecified  INTERVENTION:  - will adjust TF regimen for patient who has been started on proning trial. - Vital 1.5 @ 40 ml/hr with 45 ml Prosource TF QID. - free water flush per CCM.    NUTRITION DIAGNOSIS:   Inadequate oral intake related to inability to eat as evidenced by NPO status. -ongoing  GOAL:   Provide needs based on ASPEN/SCCM guidelines -to be met with TF regimen  MONITOR:   Vent status, TF tolerance, Labs, Weight trends  REASON FOR ASSESSMENT:   Consult Enteral/tube feeding initiation and management  ASSESSMENT:   80 y/o female with h/o HTN, DM, Afib, HLD, GERD, IBS, breast cancer s/p mastectomy, giant cell arthritis, diverticulitis and hiatal hernia who is admitted with CAP, CHF, AKI and septic shock  Significant Events: 6/16- admission 6/17- intubation 6/18- initial RD assessment (remote); bronch 6/20- first in person RD assessment 6/21- HD cath placement; CRRT initiation   Patient remains intubated with OGT replaced with small bore NGT earlier today (likely post-pyloric). She remains on CRRT at this time. Updated estimated needs.  Trial of prone positioning today.   She is receiving Vital High Protein @ 30 ml/hr which is providing 720 kcal, 63 grams protein, and 602 ml free water.   Weight trending up since admission with non-pitting edema to BLE and deep pitting edema to BUE documented in the edema section of flow sheet.    Patient is currently intubated on ventilator support MV: 12.3 L/min Temp (24hrs), Avg:98.3 F (36.8 C), Min:97.5 F (36.4 C), Max:99 F (37.2 C) Propofol: none BP: 140/66 and MAP: 90    Labs reviewed; CBGs: 158 and 124 mg/dl, K: 5.4 mmol/l, BUN: 45 mg/dl, creatinine: 1.65 mg/dl, Ca: 7.9 mg/dl, Mg: 2.7 mg/dl, LFTs elevated, GFR: 31 ml/min. Medications reviewed; sliding scale novolog, 5 units novolog every 4 hours, 10 units lantus/day, 15 ml multivitamin/day, 40 mg  protonix per tube BID, 10 mg deltasone/day. Drips; versed @ 2 mg/hr, fentanyl @ 150 mcg/hr, heparin @ 1050 units/hr.     Diet Order:   Diet Order             Diet NPO time specified  Diet effective now                   EDUCATION NEEDS:   No education needs have been identified at this time  Skin:  Skin Assessment: Reviewed RN Assessment  Last BM:  6/23  Height:   Ht Readings from Last 1 Encounters:  04/22/21 _0  (1.575 m)    Weight:   Wt Readings from Last 1 Encounters:  04/26/21 80.1 kg      Estimated Nutritional Needs:  Kcal:  1520 kcal Protein:  >/= 125 grams Fluid:  >/= 1.5 L/day       Jarome Matin, MS, RD, LDN, CNSC Inpatient Clinical Dietitian RD pager # available in AMION  After hours/weekend pager # available in [facility]

## 2021-04-27 ENCOUNTER — Inpatient Hospital Stay (HOSPITAL_COMMUNITY): Payer: Medicare Other

## 2021-04-27 LAB — HEPATIC FUNCTION PANEL
ALT: 69 U/L — ABNORMAL HIGH (ref 0–44)
AST: 119 U/L — ABNORMAL HIGH (ref 15–41)
Albumin: 1.6 g/dL — ABNORMAL LOW (ref 3.5–5.0)
Alkaline Phosphatase: 131 U/L — ABNORMAL HIGH (ref 38–126)
Bilirubin, Direct: 0.2 mg/dL (ref 0.0–0.2)
Indirect Bilirubin: 0.4 mg/dL (ref 0.3–0.9)
Total Bilirubin: 0.6 mg/dL (ref 0.3–1.2)
Total Protein: 6.6 g/dL (ref 6.5–8.1)

## 2021-04-27 LAB — RENAL FUNCTION PANEL
Albumin: 1.7 g/dL — ABNORMAL LOW (ref 3.5–5.0)
Albumin: 1.7 g/dL — ABNORMAL LOW (ref 3.5–5.0)
Anion gap: 8 (ref 5–15)
Anion gap: 6 (ref 5–15)
BUN: 33 mg/dL — ABNORMAL HIGH (ref 8–23)
BUN: 37 mg/dL — ABNORMAL HIGH (ref 8–23)
CO2: 27 mmol/L (ref 22–32)
CO2: 27 mmol/L (ref 22–32)
Calcium: 8.3 mg/dL — ABNORMAL LOW (ref 8.9–10.3)
Calcium: 8.2 mg/dL — ABNORMAL LOW (ref 8.9–10.3)
Chloride: 101 mmol/L (ref 98–111)
Chloride: 103 mmol/L (ref 98–111)
Creatinine, Ser: 1.15 mg/dL — ABNORMAL HIGH (ref 0.44–1.00)
Creatinine, Ser: 1.02 mg/dL — ABNORMAL HIGH (ref 0.44–1.00)
GFR, Estimated: 48 mL/min — ABNORMAL LOW (ref >60)
GFR, Estimated: 56 mL/min — ABNORMAL LOW (ref >60)
Glucose, Bld: 171 mg/dL — ABNORMAL HIGH (ref 70–99)
Glucose, Bld: 229 mg/dL — ABNORMAL HIGH (ref 70–99)
Phosphorus: 3 mg/dL (ref 2.5–4.6)
Phosphorus: 3 mg/dL (ref 2.5–4.6)
Potassium: 4.7 mmol/L (ref 3.5–5.1)
Potassium: 5.1 mmol/L (ref 3.5–5.1)
Sodium: 136 mmol/L (ref 135–145)
Sodium: 136 mmol/L (ref 135–145)

## 2021-04-27 LAB — CBC
HCT: 27.1 % — ABNORMAL LOW (ref 36.0–46.0)
Hemoglobin: 8.4 g/dL — ABNORMAL LOW (ref 12.0–15.0)
MCH: 31.7 pg (ref 26.0–34.0)
MCHC: 31 g/dL (ref 30.0–36.0)
MCV: 102.3 fL — ABNORMAL HIGH (ref 80.0–100.0)
Platelets: 139 10*3/uL — ABNORMAL LOW (ref 150–400)
RBC: 2.65 MIL/uL — ABNORMAL LOW (ref 3.87–5.11)
RDW: 17.2 % — ABNORMAL HIGH (ref 11.5–15.5)
WBC: 13.9 10*3/uL — ABNORMAL HIGH (ref 4.0–10.5)
nRBC: 6.4 % — ABNORMAL HIGH (ref 0.0–0.2)

## 2021-04-27 LAB — GLUCOSE, CAPILLARY
Glucose-Capillary: 122 mg/dL — ABNORMAL HIGH (ref 70–99)
Glucose-Capillary: 154 mg/dL — ABNORMAL HIGH (ref 70–99)
Glucose-Capillary: 172 mg/dL — ABNORMAL HIGH (ref 70–99)
Glucose-Capillary: 208 mg/dL — ABNORMAL HIGH (ref 70–99)
Glucose-Capillary: 221 mg/dL — ABNORMAL HIGH (ref 70–99)
Glucose-Capillary: 199 mg/dL — ABNORMAL HIGH (ref 70–99)
Glucose-Capillary: 200 mg/dL — ABNORMAL HIGH (ref 70–99)

## 2021-04-27 LAB — BLOOD GAS, ARTERIAL
Acid-Base Excess: 0.6 mmol/L (ref 0.0–2.0)
Bicarbonate: 26.4 mmol/L (ref 20.0–28.0)
FIO2: 70
O2 Saturation: 95.5 %
Patient temperature: 98.6
pCO2 arterial: 50.7 mmHg — ABNORMAL HIGH (ref 32.0–48.0)
pH, Arterial: 7.336 — ABNORMAL LOW (ref 7.350–7.450)
pO2, Arterial: 81 mmHg — ABNORMAL LOW (ref 83.0–108.0)

## 2021-04-27 LAB — QUANTIFERON-TB GOLD PLUS: QuantiFERON-TB Gold Plus: UNDETERMINED — AB

## 2021-04-27 LAB — QUANTIFERON-TB GOLD PLUS (RQFGPL)
QuantiFERON Mitogen Value: 0.19 IU/mL
QuantiFERON Nil Value: 0.11 IU/mL
QuantiFERON TB1 Ag Value: 0.11 IU/mL
QuantiFERON TB2 Ag Value: 0.12 IU/mL

## 2021-04-27 LAB — MTB RIF NAA W/O CULTURE, SPUTUM

## 2021-04-27 LAB — MAGNESIUM: Magnesium: 2.7 mg/dL — ABNORMAL HIGH (ref 1.7–2.4)

## 2021-04-27 LAB — HEPARIN LEVEL (UNFRACTIONATED): Heparin Unfractionated: 0.95 IU/mL — ABNORMAL HIGH (ref 0.30–0.70)

## 2021-04-27 LAB — APTT: aPTT: 84 seconds — ABNORMAL HIGH (ref 24–36)

## 2021-04-27 NOTE — Progress Notes (Addendum)
Pt supinated at this time. FiO2 increased to 70% due to Sats at 87%. Sats are now 27% no complications noted at this time.

## 2021-04-27 NOTE — Progress Notes (Addendum)
NAME:  ELSPETH BLUCHER MRN:  [id number] DOB:  Nov 29, 1940 LOS: 8 ADMISSION DATE:  05/02/2021 CONSULTATION DATE: 04/20/2021 REFERRING MD:  Alfredia Ferguson - TRH CHIEF COMPLAINT:  SOB, hypoxic respiratory failure   Brief Narrative   80 year old female presented with presented to [facility] 6/16 via EMS for worsening SOB, hypoxia and concern for PNA.On ED evaluation patient was hypoxic on RA and in A-fib RVR .CXR demonsrated bilateral lower lobe infiltrates. Labs were notable for Na 123, BNP 467, Trop 20 and WBC 15K. Patient was admitted to [facility].  PCCM consulted 6/17 for ongoing hypoxemic respiratory failure despite supplemental O2.  Pertinent Medical History:  A-fib Right breast cancer  Bowel perforation  HLD  Significant Hospital Events: Including procedures, antibiotic start and stop dates in addition to other pertinent events   6/16 - Admitted to [facility] for SOB, hypoxia, multifocal PNA 6/17 - PCCM consulted for persistent hypoxia, mild lethargy, ICU admission -> intubated later in day. CT chest with bilateral diffuse GGO.  Failed PICC line insertion 6/18 AM - worsening AKI - creat 1.8. on vent 80% fio2, supnine. Sedated with fent gtt, versed gtt. On pressors neo. PCT high but culture negative so far. HS trop in 20s. RASS -4. On protoni and coffee ground mild returns noted this morning 6/18 PM - -in renal failure. Worsening creat. Dropping Ur op despite fluids. Bronch BAL -> 86% polys.  Alv  hge ruled out.  Elliquis now lower dose. Code changed to DNR + full medical care 6/19 renal consulted.  6/20 Remains sedated on vent minimally responsive on fentanyl drip with worsening renal failure, now off pressors 6/21 progressive multi - organ dysfunction including worsening renal failure, HD cath placed Right IJ. CRRT started.   mild liver injury with slight transaminitis, and continued encephalopathy. Cefepime completed.  6/22 amiodarone had to be restarted for af w/ RVR 6/23 starting Proning ->improved oxygenation and  vent mechanics. Started low dose lopressor. Feeding tube placed post-pyloric    Interim History / Subjective:  Back in supine position.  Objective:  Blood pressure (Abnormal) 105/39, pulse 100, temperature 98 F (36.7 C), temperature source Axillary, resp. rate (Abnormal) 35, height _0  (1.575 m), weight 80.2 kg, SpO2 95 %.    Vent Mode: PRVC FiO2 (%):  [50 %-70 %] 60 % Set Rate:  [35 bmp] 35 bmp Vt Set:  [350 mL] 350 mL PEEP:  [8 cmH20-12 cmH20] 8 cmH20 Plateau Pressure:  [20 cmH20-31 cmH20] 25 cmH20   Intake/Output Summary (Last 24 hours) at 04/27/2021 0748 Last data filed at 04/27/2021 0700 Gross per 24 hour  Intake 2186.84 ml  Output 4500 ml  Net -2313.16 ml   Filed Weights   04/25/21 0500 04/26/21 0423 04/27/21 0500  Weight: 82.5 kg 80.1 kg 80.2 kg    Physical Exam General: This is an 80 year old white female she remains sedated on both fentanyl and Versed infusion she remains on full ventilatory support HEENT bilateral periorbital ecchymosis, this is slowly improving.  Right IJ HD catheter in place.  Orally intubated.  No JVD. Pulmonary: Diminished bilaterally.  Current plateau pressures around 25.  FiO2 70% PEEP 8.  This does reflect increased oxygen requirements following placement back in supine position Cardiac: Regular irregular.  Heart rate better controlled Abdomen: Soft not tender no organomegaly Extremities: Warm, dry, still has pitting edema in lower extremities Neuro: Currently heavily sedated  Labs/imaging that I have personally reviewed:  See below   Resolved Hospital Problem List:   Septic shock-Pressors stopped 6/19  Klebsiella UTI, POA Assessment & Plan:   Acute metabolic encephalopathy superimposed on Hx of falls with "[place]" prior to admission  Baseline hx of Giant Cell Arteritis on chronic steroids - CT head 04/16/21 - negative Plan Continuing heavy sedation goal with RASS -4 Continue supportive care Continue current prednisone dosing  for giant cell arteritis  May need to consider repeat CT imaging at some point after we start to let sedation up  Mild new onset RV failure at admit 04/20/21. ->likely pulm in origin  Hx of A Fib on amio currently in A-fib w/ RVR -on eliquis (amio held at admit, cardizem stopped 6/17 pre intubation) Plan Continue telemetry  Continue amiodarone infusion, may be able to transition to via tube in the next 48 hours or so  Continue low-dose Lopressor via tube  No change in heparin drip  She would benefit from repeat echocardiogram once she recovers from her acute illness   Severe Acute Respiratory Failure due to ARDS in context of CAP (NOS) vs ALI from UTI.  Baseline immunosuppressed due to chronic steroids -Initiated prone positioning on 6/23 with favorable oxygenation and ventilator requirements.  She did backslide a little bit  once placed back in the supine position Portable chest x-ray personally reviewed showed endotracheal tube in central line in satisfactory position there is bilateral patient he is present without significant change Plan Continue low tidal volume ventilation PAD protocol with RASS goal -4 Volume removal via CRRT, seems to be tolerating net -100 cc an hour would push this is much as we can Continuing prone protocol, as P/F ratio still Less than 100 in the supine position Serial arterial blood gas Chest x-ray today VAP bundle Trending CBC and fever curve off antibiotics  Mild transaminates  -LFTs noted to be elevated slightly on the mend 6/21, concern for progressive of multiorgan dysfunction  -In the setting of prolonged critical illness-->remain elevated.,  And slightly climbing Plan Continue to trend, may need to discontinue amiodarone  AKI POA now with progression to oliguric renal failure  -Baseline creat 0.67m 04/16/21, -CRRT started 6/21 Plan Discontinue Foley catheter Avoiding nephrotoxins Serial chemistries CRRT  Intermittent Fluid and electrolyte  imbalance: hyperkalemia Plan Continue CRRT and replace as indicated  Baesline mild anemia prior to admit  -No alveolar hemorrhage on bronchoscopy 04/21/2021. No evidence of bleeding  Plan Trend CBC Transfusion trigger less than 7 hemoglobin   Hyperglycemia with likely progression to type 2 diabetes -Patient has been persistently hyperglycemic this admission.  It appears glucose has remained elevated since starting steroids for giant cell arteritis.  Most recent hemoglobin A1c 9.1 -glycemic control w/in goal Plan Continuing current sliding scale and basal coverage   Best Practice   Diet/type: tubefeeds Pain/Anxiety/Delirium protocol RASS goal -4 VAP protocol (if indicated): Yes DVT prophylaxis: systemic heparin GI prophylaxis: PPI Glucose control:  SSI and Basal coverage - increased due to poor glycemic control  Central venous access:  Yes, and it is still needed   Arterial line:  N/A Foley:  Yes, and it is no longer needed- Mobility:  bed rest  PT consulted: N/A Studies pending: None Culture data pending:respiratory viral studies Last reviewed culture data:today Antibiotics:not indicated.  Antibiotic de-escalation: N/A Stop date: N/A Code Status:  DNR Last date of multidisciplinary goals of care discussion:Update family daily,  ccm prognosis: Life-threating Disposition: remains critically ill, will stay in intensive care   Critical care time: 30 minutes  PErick ColaceACNP-BC LNashPager # [phone] #  [phone] if no answer

## 2021-04-27 NOTE — Progress Notes (Addendum)
RT assisted with head turn to the left at this time with no complications

## 2021-04-27 NOTE — Progress Notes (Signed)
Pts head turned to left with RN x 2 without any complications. ETT tube secured.

## 2021-04-27 NOTE — Progress Notes (Signed)
RT assisted with head turn to the right at this time with no complications.

## 2021-04-27 NOTE — Progress Notes (Signed)
RT assisted with head turn to the left at this time with no complications.

## 2021-04-27 NOTE — Progress Notes (Addendum)
RT assisted with head turn to the right at this time with no complications

## 2021-04-27 NOTE — Progress Notes (Signed)
Proned 1255

## 2021-04-27 NOTE — Progress Notes (Signed)
This nurse along with charge changed patients HD catheter dressing, one of two sutures was observed to have came loose causing catheter to become twisted. This nurse changed the dressing and secured catheter   Would recommend resuturing HD catheter.

## 2021-04-27 NOTE — Progress Notes (Signed)
[place] for Heparin Indication: atrial fibrillation  Allergies  Allergen Reactions   Crestor [Rosuvastatin Calcium] Other (See Comments)    Shoulder pain   Crestor [Rosuvastatin]     Other reaction(s): Unknown   Lipitor [Atorvastatin Calcium] Other (See Comments)    Myalgias    Pravachol [Pravastatin] Other (See Comments)    myalgias   Penicillins Itching    Has patient had a PCN reaction causing immediate rash, facial/tongue/throat swelling, SOB or lightheadedness with hypotension: No Has patient had a PCN reaction causing severe rash involving mucus membranes or skin necrosis: No Has patient had a PCN reaction that required hospitalization No Has patient had a PCN reaction occurring within the last 10 years: No If all of the above answers are "NO", then may proceed with Cephalosporin use.     Patient Measurements: Height: 5' 2"  (157.5 cm) Weight: 80.2 kg (176 lb 12.9 oz) IBW/kg (Calculated) : 50.1 Heparin Dosing Weight: 67 kg  Vital Signs: Temp: 98.6 F (37 C) (06/24 0400) Temp Source: Axillary (06/24 0400) BP: 112/45 (06/24 0600) Pulse Rate: 95 (06/24 0600)  Labs: Recent Labs    04/24/21 0803 04/24/21 1430 04/25/21 0420 04/25/21 1333 04/25/21 2330 04/26/21 0415 04/26/21 1600 04/27/21 0520 04/27/21 0521  HGB  --    < > 7.0*  --   --  8.7*  --  8.4*  --   HCT  --   --  22.1*  --   --  27.6*  --  27.1*  --   PLT  --   --  190  --   --  172  --  139*  --   APTT  --    < > 65*   < > 92* 96*  --  84*  --   LABPROT 20.4*  --   --   --   --   --   --   --   --   INR 1.7*  --   --   --   --   --   --   --   --   HEPARINUNFRC  --   --  >1.10*  --   --  >1.10*  --  0.95*  --   CREATININE  --    < > 3.84*   < >  --  1.65* 1.52*  --  1.15*   < > = values in this interval not displayed.    Estimated Creatinine Clearance: 38.3 mL/min (A) (by C-G formula based on SCr of 1.15 mg/dL (H)).  Medications:  Scheduled:   sodium  chloride   Intravenous Once   amiodarone  200 mg Per Tube Daily   artificial tears  1 application Both Eyes T2I   chlorhexidine gluconate (MEDLINE KIT)  15 mL Mouth Rinse BID   Chlorhexidine Gluconate Cloth  6 each Topical Daily   feeding supplement (PROSource TF)  45 mL Per Tube QID   insulin aspart  0-15 Units Subcutaneous Q4H   insulin aspart  5 Units Subcutaneous Q4H   insulin glargine  10 Units Subcutaneous Daily   ipratropium  0.5 mg Nebulization Q6H   levalbuterol  0.63 mg Nebulization Q6H   mouth rinse  15 mL Mouth Rinse 10 times per day   metoprolol tartrate  25 mg Per Tube BID   multivitamin  15 mL Per Tube Daily   pantoprazole sodium  40 mg Per Tube BID   predniSONE  10 mg Per Tube  BID WC   sodium chloride flush  10-40 mL Intracatheter Q12H   Infusions:   sodium chloride     sodium chloride 10 mL/hr at 04/27/21 0700   amiodarone Stopped (04/25/21 1321)   feeding supplement (VITAL 1.5 CAL) 1,000 mL (04/26/21 2000)   fentaNYL infusion INTRAVENOUS 150 mcg/hr (04/27/21 0700)   heparin 1,050 Units/hr (04/27/21 0700)   midazolam 2 mg/hr (04/27/21 0700)   prismasol BGK 2/2.5 replacement solution 500 mL/hr at 04/27/21 0417   prismasol BGK 2/2.5 replacement solution 500 mL/hr at 04/27/21 0210   prismasol BGK 4/2.5 1,500 mL/hr at 04/27/21 0444    Assessment: 72 yoF admit 6/16 with ShOB, DOE, recurrent falls, BLE edema. PMH includes Afib on apixaban.  Due to worsening renal function, she was changed to heparin per pharmacy on 6/19.  She started CRRT on 6/21. Recent DOAC and AKI will falsely elevate Heparin levels; monitor and titrate heparin dose using aPTT until levels correlate.  Today, 04/27/2021:  aPTT 84, therapeutic on heparin 1050 units/hr HL remains falsely elevated at 0.95 this morning. Hgb remains improved at 8.4 s/p transfusion, Plts decreased to 139k SCr down to 1.15 No bleeding complications and no line issues reported.    Goal of Therapy:  Heparin level 0.3-0.7  units/ml aPTT 66-102 seconds Monitor platelets by anticoagulation protocol: Yes   Plan:  Continue heparin infusion at 1050 units/hr Daily aPTT, HL and CBC  Gretta Arab PharmD, BCPS Clinical Pharmacist [facility] main pharmacy [phone] 04/27/2021 7:26 AM

## 2021-04-27 NOTE — Progress Notes (Signed)
Pts head turned to rt side with RN x 2 without any complications. ETT tube secured.

## 2021-04-27 NOTE — Progress Notes (Signed)
Admit: 04/20/2021 LOS: 8  9F nl GFR with anuric AKI on CRRT in setting of CAP/ARDS/VDRF, shock now off pressors, hx/o giant cell arteritis  Subjective:  Started CRRT 6/21 -  running ok-  pre and post now 2K Off pressors, tol UF 22m/h net neg-- minus 2300 yest NoUOP FIO2 back up when supine    06/23 0701 - 06/24 0700 In: 2186.8 [I.V.:940; NG/GT:1246.8] Out: 4500 [Urine:6]  Filed Weights   04/25/21 0500 04/26/21 0423 04/27/21 0500  Weight: 82.5 kg 80.1 kg 80.2 kg    Scheduled Meds:  amiodarone  200 mg Per Tube Daily   artificial tears  1 application Both Eyes QQ0G  chlorhexidine gluconate (MEDLINE KIT)  15 mL Mouth Rinse BID   Chlorhexidine Gluconate Cloth  6 each Topical Daily   feeding supplement (PROSource TF)  45 mL Per Tube QID   insulin aspart  0-15 Units Subcutaneous Q4H   insulin aspart  5 Units Subcutaneous Q4H   insulin glargine  10 Units Subcutaneous Daily   ipratropium  0.5 mg Nebulization Q6H   levalbuterol  0.63 mg Nebulization Q6H   mouth rinse  15 mL Mouth Rinse 10 times per day   metoprolol tartrate  25 mg Per Tube BID   multivitamin  15 mL Per Tube Daily   pantoprazole sodium  40 mg Per Tube BID   predniSONE  10 mg Per Tube BID WC   sodium chloride flush  10-40 mL Intracatheter Q12H   Continuous Infusions:  sodium chloride     sodium chloride 10 mL/hr at 04/27/21 1300   feeding supplement (VITAL 1.5 CAL) 1,000 mL (04/26/21 2000)   fentaNYL infusion INTRAVENOUS 150 mcg/hr (04/27/21 1300)   heparin Stopped (04/27/21 1252)   midazolam 2 mg/hr (04/27/21 1300)   prismasol BGK 2/2.5 replacement solution 500 mL/hr at 04/27/21 1228   prismasol BGK 2/2.5 replacement solution 500 mL/hr at 04/27/21 0210   prismasol BGK 4/2.5 1,500 mL/hr at 04/27/21 1130   PRN Meds:.Place/Maintain arterial line **AND** sodium chloride, acetaminophen, acetaminophen, cisatracurium (NIMBEX) injection, docusate, fentaNYL, heparin, heparin, midazolam **AND** midazolam, polyethylene  glycol, sodium chloride flush  Current Labs: reviewed  6/16 UA w/o hematuria, trace protein 6/19 Renal [place] HN  Physical Exam:  Blood pressure (!) 108/47, pulse (!) 110, temperature 98 F (36.7 C), temperature source Axillary, resp. rate (!) 35, height 5' 2"  (1.575 m), weight 80.2 kg, SpO2 97 %. Intubated sedated Tachy IRIR Coarse bs b/l 1+ LEE S/nt/nd No rashes/lesions ETT in place, NCAT  A Anuric AKI, likely ATN in setting of #2 and #3. Given prolonged anuric state, progressive azotemia, and hypervolemia started CRRT 6/21 after discussing with family.  Tolerated so far, not clotting. UF 50-1076mh-  will try for 100-150. Systemic heparin for #7.  No evidence of GFR recovery.  ARDS/CAP/VDRF: per CCM,  cont with UF with CRRT-  need to continue CRRT given tenuous state  Septic Shock, off pressors Giant Cell arteritis, chronic IS, now on stress dosing Anemia, transfusion per primary, for pRBC 6/22 [name] UTI: ABX per CCM AFib on hep gtt Hypervolemia Mild hyperkalemia, now 2 k pre and post-  phos of 3.0-  no repletion needed yet    P  Daily reassessment, if increasingly unlikely for her to make quick improvements family would want to rediscuss [place] to think supportive care and time can give some GFR recovery, but not yet  KeLouis Meckel6/24/2022, 1:30 PM  Recent Labs  Lab 04/26/21 0415  04/26/21 1600 04/27/21 0521  NA 135 136 136  K 5.4* 5.2* 4.7  CL 102 101 101  CO2 27 27 27   GLUCOSE 174* 143* 171*  BUN 45* 35* 33*  CREATININE 1.65* 1.52* 1.15*  CALCIUM 7.9* 8.3* 8.3*  PHOS 3.1 3.7 3.0   Recent Labs  Lab 04/25/21 0420 04/26/21 0415 04/27/21 0520  WBC 16.4* 15.7* 13.9*  HGB 7.0* 8.7* 8.4*  HCT 22.1* 27.6* 27.1*  MCV 101.8* 101.5* 102.3*  PLT 190 172 139*

## 2021-04-28 LAB — POCT I-STAT, CHEM 8
BUN: 26 mg/dL — ABNORMAL HIGH (ref 8–23)
Calcium, Ion: 1.21 mmol/L (ref 1.15–1.40)
Chloride: 101 mmol/L (ref 98–111)
Creatinine, Ser: 0.8 mg/dL (ref 0.44–1.00)
Glucose, Bld: 166 mg/dL — ABNORMAL HIGH (ref 70–99)
HCT: 31 % — ABNORMAL LOW (ref 36.0–46.0)
Hemoglobin: 10.5 g/dL — ABNORMAL LOW (ref 12.0–15.0)
Potassium: 4.4 mmol/L (ref 3.5–5.1)
Sodium: 137 mmol/L (ref 135–145)
TCO2: 30 mmol/L (ref 22–32)

## 2021-04-28 LAB — BLOOD GAS, ARTERIAL
Acid-Base Excess: 0 mmol/L (ref 0.0–2.0)
Allens test (pass/fail): POSITIVE — AB
Bicarbonate: 26.4 mmol/L (ref 20.0–28.0)
FIO2: 40
MECHVT: 350 mL
O2 Saturation: 96.8 %
Patient temperature: 98.7
RATE: 35 resp/min
pCO2 arterial: 55.4 mmHg — ABNORMAL HIGH (ref 32.0–48.0)
pH, Arterial: 7.3 — ABNORMAL LOW (ref 7.350–7.450)
pO2, Arterial: 94.7 mmHg (ref 83.0–108.0)

## 2021-04-28 LAB — DIFFERENTIAL
Band Neutrophils: 7 %
Basophils Relative: 0 %
Blasts: 0 %
Eosinophils Relative: 0 %
Lymphocytes Relative: 5 %
Metamyelocytes Relative: 3 %
Monocytes Relative: 2 %
Myelocytes: 5 %
Neutrophils Relative %: 78 %
Promyelocytes Relative: 0 %
RBC Morphology: NORMAL
nRBC: 9 /100 WBC — ABNORMAL HIGH

## 2021-04-28 LAB — GLUCOSE, CAPILLARY
Glucose-Capillary: 175 mg/dL — ABNORMAL HIGH (ref 70–99)
Glucose-Capillary: 189 mg/dL — ABNORMAL HIGH (ref 70–99)
Glucose-Capillary: 254 mg/dL — ABNORMAL HIGH (ref 70–99)
Glucose-Capillary: 219 mg/dL — ABNORMAL HIGH (ref 70–99)
Glucose-Capillary: 178 mg/dL — ABNORMAL HIGH (ref 70–99)
Glucose-Capillary: 215 mg/dL — ABNORMAL HIGH (ref 70–99)

## 2021-04-28 LAB — RENAL FUNCTION PANEL
Albumin: 1.9 g/dL — ABNORMAL LOW (ref 3.5–5.0)
Albumin: 1.7 g/dL — ABNORMAL LOW (ref 3.5–5.0)
Anion gap: 8 (ref 5–15)
Anion gap: 7 (ref 5–15)
BUN: 33 mg/dL — ABNORMAL HIGH (ref 8–23)
BUN: 32 mg/dL — ABNORMAL HIGH (ref 8–23)
CO2: 26 mmol/L (ref 22–32)
CO2: 28 mmol/L (ref 22–32)
Calcium: 8.5 mg/dL — ABNORMAL LOW (ref 8.9–10.3)
Calcium: 8.7 mg/dL — ABNORMAL LOW (ref 8.9–10.3)
Chloride: 101 mmol/L (ref 98–111)
Chloride: 102 mmol/L (ref 98–111)
Creatinine, Ser: 1.08 mg/dL — ABNORMAL HIGH (ref 0.44–1.00)
Creatinine, Ser: 0.86 mg/dL (ref 0.44–1.00)
GFR, Estimated: 52 mL/min — ABNORMAL LOW (ref >60)
GFR, Estimated: >60 mL/min (ref >60)
Glucose, Bld: 166 mg/dL — ABNORMAL HIGH (ref 70–99)
Glucose, Bld: 211 mg/dL — ABNORMAL HIGH (ref 70–99)
Phosphorus: 3.2 mg/dL (ref 2.5–4.6)
Phosphorus: 2.4 mg/dL — ABNORMAL LOW (ref 2.5–4.6)
Potassium: 4.5 mmol/L (ref 3.5–5.1)
Potassium: 5 mmol/L (ref 3.5–5.1)
Sodium: 135 mmol/L (ref 135–145)
Sodium: 137 mmol/L (ref 135–145)

## 2021-04-28 LAB — HEPATIC FUNCTION PANEL
ALT: 83 U/L — ABNORMAL HIGH (ref 0–44)
AST: 120 U/L — ABNORMAL HIGH (ref 15–41)
Albumin: 1.9 g/dL — ABNORMAL LOW (ref 3.5–5.0)
Alkaline Phosphatase: 155 U/L — ABNORMAL HIGH (ref 38–126)
Bilirubin, Direct: 0.1 mg/dL (ref 0.0–0.2)
Indirect Bilirubin: 0.4 mg/dL (ref 0.3–0.9)
Total Bilirubin: 0.5 mg/dL (ref 0.3–1.2)
Total Protein: 7.5 g/dL (ref 6.5–8.1)

## 2021-04-28 LAB — CBC
HCT: 31.9 % — ABNORMAL LOW (ref 36.0–46.0)
Hemoglobin: 9.6 g/dL — ABNORMAL LOW (ref 12.0–15.0)
MCH: 31.2 pg (ref 26.0–34.0)
MCHC: 30.1 g/dL (ref 30.0–36.0)
MCV: 103.6 fL — ABNORMAL HIGH (ref 80.0–100.0)
Platelets: 140 10*3/uL — ABNORMAL LOW (ref 150–400)
RBC: 3.08 MIL/uL — ABNORMAL LOW (ref 3.87–5.11)
RDW: 17 % — ABNORMAL HIGH (ref 11.5–15.5)
WBC: 15.5 10*3/uL — ABNORMAL HIGH (ref 4.0–10.5)

## 2021-04-28 LAB — MAGNESIUM: Magnesium: 2.8 mg/dL — ABNORMAL HIGH (ref 1.7–2.4)

## 2021-04-28 LAB — POCT ACTIVATED CLOTTING TIME: Activated Clotting Time: 190 seconds

## 2021-04-28 LAB — HEPARIN LEVEL (UNFRACTIONATED): Heparin Unfractionated: 0.69 IU/mL (ref 0.30–0.70)

## 2021-04-28 LAB — APTT: aPTT: 70 seconds — ABNORMAL HIGH (ref 24–36)

## 2021-04-28 MED ORDER — PHENYLEPHRINE HCL-NACL 10-0.9 MG/250ML-% IV SOLN
0.0000 ug/min | INTRAVENOUS | Status: DC
  Administered 2021-04-28: 20 ug/min via INTRAVENOUS
  Filled 2021-04-28: qty 250

## 2021-04-28 MED ORDER — PHENYLEPHRINE CONCENTRATED 100MG/250ML (0.4 MG/ML) INFUSION SIMPLE
0.0000 ug/min | INTRAVENOUS | Status: DC
  Administered 2021-04-28: 80 ug/min via INTRAVENOUS
  Filled 2021-04-28: qty 250

## 2021-04-28 MED ORDER — CISATRACURIUM BESYLATE 20 MG/10ML IV SOLN
8.0000 mg | INTRAVENOUS | Status: DC | PRN
  Filled 2021-04-28: qty 10

## 2021-04-28 NOTE — Progress Notes (Signed)
Admit: 04/07/2021 LOS: 9  53F nl GFR with anuric AKI on CRRT in setting of CAP/ARDS/VDRF, shock now off pressors, hx/o giant cell arteritis  Subjective:  Started CRRT 6/21 -  running ok-  pre and post now 2K  minus 2700 yest-  but req restart of pressors-  UF taken down to even  NoUOP FIO2 down a lot actually to 40%   06/24 0701 - 06/25 0700 In: 2138.4 [I.V.:908.4; NG/GT:1125] Out: 4878   Filed Weights   04/26/21 0423 04/27/21 0500 04/28/21 0439  Weight: 80.1 kg 80.2 kg 73.8 kg    Scheduled Meds:  amiodarone  200 mg Per Tube Daily   artificial tears  1 application Both Eyes N6E   chlorhexidine gluconate (MEDLINE KIT)  15 mL Mouth Rinse BID   Chlorhexidine Gluconate Cloth  6 each Topical Daily   feeding supplement (PROSource TF)  45 mL Per Tube QID   insulin aspart  0-15 Units Subcutaneous Q4H   insulin aspart  5 Units Subcutaneous Q4H   insulin glargine  10 Units Subcutaneous Daily   ipratropium  0.5 mg Nebulization Q6H   levalbuterol  0.63 mg Nebulization Q6H   mouth rinse  15 mL Mouth Rinse 10 times per day   metoprolol tartrate  25 mg Per Tube BID   multivitamin  15 mL Per Tube Daily   pantoprazole sodium  40 mg Per Tube BID   predniSONE  10 mg Per Tube BID WC   sodium chloride flush  10-40 mL Intracatheter Q12H   Continuous Infusions:  sodium chloride Stopped (04/28/21 0829)   sodium chloride 10 mL/hr at 04/27/21 2200   feeding supplement (VITAL 1.5 CAL) 1,000 mL (04/27/21 1830)   fentaNYL infusion INTRAVENOUS 150 mcg/hr (04/28/21 1100)   heparin 1,050 Units/hr (04/28/21 1100)   midazolam 2 mg/hr (04/28/21 1100)   phenylephrine (NEO-SYNEPHRINE) Adult infusion 80 mcg/min (04/28/21 1118)   prismasol BGK 2/2.5 replacement solution 500 mL/hr at 04/27/21 2314   prismasol BGK 2/2.5 replacement solution 500 mL/hr at 04/28/21 1142   prismasol BGK 4/2.5 1,500 mL/hr at 04/28/21 0832   PRN Meds:.Place/Maintain arterial line **AND** sodium chloride, acetaminophen,  acetaminophen, cisatracurium (NIMBEX) injection, docusate, fentaNYL, heparin, midazolam **AND** midazolam, polyethylene glycol, sodium chloride flush  Current Labs: reviewed  6/16 UA w/o hematuria, trace protein 6/19 Renal US no HN  Physical Exam:  Blood pressure (!) 86/46, pulse 94, temperature 99.7 F (37.6 C), temperature source Oral, resp. rate (!) 35, height _0  (1.575 m), weight 73.8 kg, SpO2 98 %. Intubated sedated Tachy IRIR Coarse bs b/l 1+ LEE S/nt/nd No rashes/lesions ETT in place, NCAT  A Anuric AKI, likely ATN in setting of #2 and #3. Given prolonged anuric state, progressive azotemia, and hypervolemia started CRRT 6/21 after discussing with family.  Tolerated so far, not clotting. UF had been increased with good results until early this AM when pressor had to be restarted so now back to running even. Systemic heparin for #7.  No evidence of GFR recovery yet.  ARDS/CAP/VDRF: per CCM,  cont with UF with CRRT-  need to continue CRRT given tenuous state  Septic Shock, off pressors but had to be resumed this AM Giant Cell arteritis, chronic IS, now on stress dosing Anemia, transfusion per primary, for pRBC 6/22 [name] UTI: ABX per CCM AFib on hep gtt Hypervolemia-  had good success with UF over last few days but now that pressors resumed will back off to keeping even Mild hyperkalemia, now 2 k pre and  post-  phos of 3.0-  no repletion needed yet    P  Daily reassessment, if increasingly unlikely for her to make quick improvements family would want to rediscuss [place] reasonable to think supportive care and time can give some GFR recovery, but not yet  Louis Meckel  04/28/2021, 11:53 AM  Recent Labs  Lab 04/27/21 0521 04/27/21 1600 04/28/21 0402 04/28/21 0440  NA 136 136 137 135  K 4.7 5.1 4.4 4.5  CL 101 103 101 101  CO2 27 27  --  26  GLUCOSE 171* 229* 166* 166*  BUN 33* 37* 26* 33*  CREATININE 1.15* 1.02* 0.80 1.08*  CALCIUM 8.3* 8.2*  --   8.5*  PHOS 3.0 3.0  --  3.2   Recent Labs  Lab 04/26/21 0415 04/27/21 0520 04/28/21 0402 04/28/21 0440  WBC 15.7* 13.9*  --  15.5*  HGB 8.7* 8.4* 10.5* 9.6*  HCT 27.6* 27.1* 31.0* 31.9*  MCV 101.5* 102.3*  --  103.6*  PLT 172 139*  --  140*

## 2021-04-28 NOTE — Progress Notes (Signed)
NAME:  Leslie Bryant MRN:  [id number] DOB:  05-Apr-1941 LOS: 9 ADMISSION DATE:  04/09/2021 CONSULTATION DATE: 04/20/2021 REFERRING MD:  Alfredia Ferguson - TRH CHIEF COMPLAINT:  SOB, hypoxic respiratory failure   Brief Narrative   80 year old female presented with presented to [facility] 6/16 via EMS for worsening SOB, hypoxia and concern for PNA.On ED evaluation patient was hypoxic on RA and in A-fib RVR .CXR demonsrated bilateral lower lobe infiltrates. Labs were notable for Na 123, BNP 467, Trop 20 and WBC 15K. Patient was admitted to [facility].  PCCM consulted 6/17 for ongoing hypoxemic respiratory failure despite supplemental O2.  Pertinent Medical History:  A-fib Right breast cancer  Bowel perforation  HLD  Significant Hospital Events: Including procedures, antibiotic start and stop dates in addition to other pertinent events   6/16 - Admitted to [facility] for SOB, hypoxia, multifocal PNA 6/17 - PCCM consulted for persistent hypoxia, mild lethargy, ICU admission -> intubated later in day. CT chest with bilateral diffuse GGO.  Failed PICC line insertion 6/18 AM - worsening AKI - creat 1.8. on vent 80% fio2, supnine. Sedated with fent gtt, versed gtt. On pressors neo. PCT high but culture negative so far. HS trop in 20s. RASS -4. On protoni and coffee ground mild returns noted this morning 6/18 PM - -in renal failure. Worsening creat. Dropping Ur op despite fluids. Bronch BAL -> 86% polys.  Alv  hge ruled out.  Elliquis now lower dose. Code changed to DNR + full medical care 6/19 renal consulted.  6/20 Remains sedated on vent minimally responsive on fentanyl drip with worsening renal failure, now off pressors 6/21 progressive multi - organ dysfunction including worsening renal failure, HD cath placed Right IJ. CRRT started.   mild liver injury with slight transaminitis, and continued encephalopathy. Cefepime completed.  6/22 amiodarone had to be restarted for af w/ RVR 6/23 starting Proning ->improved oxygenation and  vent mechanics. Started low dose lopressor. Feeding tube placed post-pyloric   Interim History / Subjective:  In supine position Oxygenation appears better today Discussed with son at bedside  Objective:  Blood pressure (!) 93/45, pulse 92, temperature 99.7 F (37.6 C), temperature source Oral, resp. rate (!) 35, height _0  (1.575 m), weight 73.8 kg, SpO2 99 %.    Vent Mode: PRVC FiO2 (%):  [40 %-60 %] 40 % Set Rate:  [35 bmp] 35 bmp Vt Set:  [350 mL] 350 mL PEEP:  [5 cmH20-8 cmH20] 5 cmH20 Plateau Pressure:  [13 cmH20-26 cmH20] 21 cmH20   Intake/Output Summary (Last 24 hours) at 04/28/2021 0843 Last data filed at 04/28/2021 4496 Gross per 24 hour  Intake 2158.38 ml  Output 4921 ml  Net -2762.62 ml   Filed Weights   04/26/21 0423 04/27/21 0500 04/28/21 0439  Weight: 80.1 kg 80.2 kg 73.8 kg    Physical Exam General: Sedated on fentanyl and Versed, full vent support HEENT: Decreased breath sounds bilaterally periorbital ecchymosis right IJ HD catheter in place.  Orally intubated.  No JVD. Pulmonary: Diminished bilaterally.  Current plateau pressures around 25.  FiO2 40% PEEP 8.  Improving oxygen saturations and decreased FiO2 Cardiac: Appreciate Abdomen: Soft, bowel sounds appreciated Extremities: Peripheral edema Neuro: Currently heavily sedated  Labs/imaging that I have personally reviewed:  See below   Resolved Hospital Problem List:   Septic shock-Pressors stopped 6/19 Klebsiella UTI, POA Assessment & Plan:   Acute metabolic encephalopathy History of giant cell arteritis on chronic steroids -Continue sedation -Continue supportive care -Continue prednisone  New  onset right ventricular failure at admission History of atrial fibrillation on amiodarone -Continue amiodarone -Low-dose Lopressor via tube -Continue heparin -Echocardiogram once more stable  CVA acute respiratory failure due to ARDS -Community-acquired pneumonia versus Ehieli from UTI -Baseline  immunosuppression due to chronic steroids -Last proned 6/24 -Continue mechanical ventilation per ARDS protocol Target TVol 6-8cc/kgIBW Target Plateau Pressure < 30cm H20 Target driving pressure less than 15 cm of water Target PaO2 55-65: titrate PEEP/FiO2 per protocol As long as PaO2 to FiO2 ratio is less than 1:150 position in prone position for 16 hours a day Ventilator associated pneumonia prevention protocol  Transaminitis -Continue to trend  Acute kidney injury-present on admission Presently oliguric with renal failure -On CRRT, started 6/21 -Avoid nephrotoxic's -Trend electrolytes -Maintain renal perfusion -Replace electrolytes  Hyperglycemia -Continue SSI  Anemia -Continue to trend CBC -Transfuse per protocol as needed   Change RASS goal to -2  Best Practice   Diet/type: tubefeeds Pain/Anxiety/Delirium protocol RASS goal: -1 to -2 VAP protocol (if indicated): Yes DVT prophylaxis: systemic heparin GI prophylaxis: PPI Glucose control:  SSI and Basal coverage - increased due to poor glycemic control  Central venous access:  Yes, and it is still needed   Arterial line:  N/A Foley:  Yes, and it is no longer needed- Mobility:  bed rest  PT consulted: N/A Studies pending: None Culture data pending:respiratory viral studies Last reviewed culture data:today Antibiotics:not indicated.  Antibiotic de-escalation: N/A Stop date: N/A Code Status:  DNR Last date of multidisciplinary goals of care discussion:Update family daily,  ccm prognosis: Life-threating Disposition: remains critically ill, will stay in intensive care  The patient is critically ill with multiple organ systems failure and requires high complexity decision making for assessment and support, frequent evaluation and titration of therapies, application of advanced monitoring technologies and extensive interpretation of multiple databases. Critical Care Time devoted to patient care services described in  this note independent of APP/resident time (if applicable)  is 32 minutes.   Sherrilyn Rist MD [place] [facility] Personal pager: [phone] If unanswered, please page CCM On-call: [phone]

## 2021-04-28 NOTE — Progress Notes (Signed)
Pt supined with RN x 3 RT x 2 without any complications. Removed cloth tape and no breakdown on the face noted. ETT tube secured with tube holder at 21.

## 2021-04-28 NOTE — Progress Notes (Signed)
[facility] for Heparin Indication: atrial fibrillation  Allergies  Allergen Reactions   Crestor [Rosuvastatin Calcium] Other (See Comments)    Shoulder pain   Crestor [Rosuvastatin]     Other reaction(s): Unknown   Lipitor [Atorvastatin Calcium] Other (See Comments)    Myalgias    Pravachol [Pravastatin] Other (See Comments)    myalgias   Penicillins Itching    Has patient had a PCN reaction causing immediate rash, facial/tongue/throat swelling, SOB or lightheadedness with hypotension: No Has patient had a PCN reaction causing severe rash involving mucus membranes or skin necrosis: No Has patient had a PCN reaction that required hospitalization No Has patient had a PCN reaction occurring within the last 10 years: No If all of the above answers are "NO", then may proceed with Cephalosporin use.     Patient Measurements: Height: _0  (157.5 cm) Weight: 73.8 kg (162 lb 11.2 oz) IBW/kg (Calculated) : 50.1 Heparin Dosing Weight: 67 kg  Vital Signs: Temp: 97.6 F (36.4 C) (06/25 0439) Temp Source: Oral (06/25 0439) BP: 108/49 (06/25 0630) Pulse Rate: 76 (06/25 0630)  Labs: Recent Labs    04/26/21 0415 04/26/21 1600 04/27/21 0520 04/27/21 0521 04/27/21 1600 04/28/21 0402 04/28/21 0440  HGB 8.7*  --  8.4*  --   --  10.5* 9.6*  HCT 27.6*  --  27.1*  --   --  31.0* 31.9*  PLT 172  --  139*  --   --   --  140*  APTT 96*  --  84*  --   --   --  70*  HEPARINUNFRC >1.10*  --  0.95*  --   --   --  0.69  CREATININE 1.65*   < >  --    < > 1.02* 0.80 1.08*   < > = values in this interval not displayed.    Estimated Creatinine Clearance: 39.1 mL/min (A) (by C-G formula based on SCr of 1.08 mg/dL (H)).  Medications:  Scheduled:   amiodarone  200 mg Per Tube Daily   artificial tears  1 application Both Eyes C4U   chlorhexidine gluconate (MEDLINE KIT)  15 mL Mouth Rinse BID   Chlorhexidine Gluconate Cloth  6 each Topical Daily   feeding  supplement (PROSource TF)  45 mL Per Tube QID   insulin aspart  0-15 Units Subcutaneous Q4H   insulin aspart  5 Units Subcutaneous Q4H   insulin glargine  10 Units Subcutaneous Daily   ipratropium  0.5 mg Nebulization Q6H   levalbuterol  0.63 mg Nebulization Q6H   mouth rinse  15 mL Mouth Rinse 10 times per day   metoprolol tartrate  25 mg Per Tube BID   multivitamin  15 mL Per Tube Daily   pantoprazole sodium  40 mg Per Tube BID   predniSONE  10 mg Per Tube BID WC   sodium chloride flush  10-40 mL Intracatheter Q12H   Infusions:   sodium chloride 10 mL/hr at 04/28/21 0700   sodium chloride 10 mL/hr at 04/27/21 2200   feeding supplement (VITAL 1.5 CAL) 1,000 mL (04/27/21 1830)   fentaNYL infusion INTRAVENOUS 150 mcg/hr (04/28/21 0700)   heparin 1,050 Units/hr (04/28/21 0700)   midazolam 2 mg/hr (04/28/21 0700)   prismasol BGK 2/2.5 replacement solution 500 mL/hr at 04/27/21 2314   prismasol BGK 2/2.5 replacement solution 500 mL/hr at 04/28/21 0119   prismasol BGK 4/2.5 1,500 mL/hr at 04/28/21 0507    Assessment: 41 yoF  admit 6/16 with ShOB, DOE, recurrent falls, BLE edema. PMH includes Afib on apixaban.  Due to worsening renal function, she was changed to heparin per pharmacy on 6/19.  She started CRRT on 6/21. Recent DOAC and AKI will falsely elevate Heparin levels; monitor and titrate heparin dose using aPTT until levels correlate.  Today, 04/28/2021:  aPTT 70, therapeutic on heparin 1050 units/hr HL remains falsely elevated at 0.69 this morning, but trending down Hgb remains improved at 9.6, Plts 140k SCr down to 1.08 No bleeding complications and no line issues reported.  No CRRT filter clotting or complications.   Goal of Therapy:  Heparin level 0.3-0.7 units/ml aPTT 66-102 seconds Monitor platelets by anticoagulation protocol: Yes   Plan:  Continue heparin infusion at 1050 units/hr Daily aPTT, HL and CBC  Gretta Arab PharmD, BCPS Clinical Pharmacist [facility] main  pharmacy [phone] 04/28/2021 7:29 AM

## 2021-04-28 NOTE — Progress Notes (Signed)
Systolic BP 89-34M. RN decreased CRRT to pull even and CCM notified for pressors. Orders received and phenylephrine initiated.

## 2021-04-28 NOTE — Progress Notes (Signed)
Pts head turned to rt side with RN x 2 without any complications. ETT tube secured.

## 2021-04-28 NOTE — Progress Notes (Signed)
Pts head turned to left side with RN without any complications. ETT tube secured.

## 2021-04-29 ENCOUNTER — Inpatient Hospital Stay (HOSPITAL_COMMUNITY): Payer: Medicare Other

## 2021-04-29 LAB — GLUCOSE, CAPILLARY
Glucose-Capillary: 255 mg/dL — ABNORMAL HIGH (ref 70–99)
Glucose-Capillary: 130 mg/dL — ABNORMAL HIGH (ref 70–99)
Glucose-Capillary: 233 mg/dL — ABNORMAL HIGH (ref 70–99)
Glucose-Capillary: 255 mg/dL — ABNORMAL HIGH (ref 70–99)
Glucose-Capillary: 206 mg/dL — ABNORMAL HIGH (ref 70–99)

## 2021-04-29 LAB — CBC
HCT: 30.3 % — ABNORMAL LOW (ref 36.0–46.0)
Hemoglobin: 9.3 g/dL — ABNORMAL LOW (ref 12.0–15.0)
MCH: 31.5 pg (ref 26.0–34.0)
MCHC: 30.7 g/dL (ref 30.0–36.0)
MCV: 102.7 fL — ABNORMAL HIGH (ref 80.0–100.0)
Platelets: 142 10*3/uL — ABNORMAL LOW (ref 150–400)
RBC: 2.95 MIL/uL — ABNORMAL LOW (ref 3.87–5.11)
RDW: 16.7 % — ABNORMAL HIGH (ref 11.5–15.5)
WBC: 16.7 10*3/uL — ABNORMAL HIGH (ref 4.0–10.5)
nRBC: 9.2 % — ABNORMAL HIGH (ref 0.0–0.2)

## 2021-04-29 LAB — RENAL FUNCTION PANEL
Albumin: 1.9 g/dL — ABNORMAL LOW (ref 3.5–5.0)
Albumin: 1.8 g/dL — ABNORMAL LOW (ref 3.5–5.0)
Anion gap: 8 (ref 5–15)
Anion gap: 9 (ref 5–15)
BUN: 33 mg/dL — ABNORMAL HIGH (ref 8–23)
BUN: 34 mg/dL — ABNORMAL HIGH (ref 8–23)
CO2: 28 mmol/L (ref 22–32)
CO2: 27 mmol/L (ref 22–32)
Calcium: 8.4 mg/dL — ABNORMAL LOW (ref 8.9–10.3)
Calcium: 8.3 mg/dL — ABNORMAL LOW (ref 8.9–10.3)
Chloride: 101 mmol/L (ref 98–111)
Chloride: 100 mmol/L (ref 98–111)
Creatinine, Ser: 0.82 mg/dL (ref 0.44–1.00)
Creatinine, Ser: 0.81 mg/dL (ref 0.44–1.00)
GFR, Estimated: >60 mL/min (ref >60)
GFR, Estimated: >60 mL/min (ref >60)
Glucose, Bld: 105 mg/dL — ABNORMAL HIGH (ref 70–99)
Glucose, Bld: 258 mg/dL — ABNORMAL HIGH (ref 70–99)
Phosphorus: 2.7 mg/dL (ref 2.5–4.6)
Phosphorus: 3.8 mg/dL (ref 2.5–4.6)
Potassium: 4.4 mmol/L (ref 3.5–5.1)
Potassium: 4.7 mmol/L (ref 3.5–5.1)
Sodium: 137 mmol/L (ref 135–145)
Sodium: 136 mmol/L (ref 135–145)

## 2021-04-29 LAB — BLOOD GAS, ARTERIAL
Acid-Base Excess: 1.9 mmol/L (ref 0.0–2.0)
Bicarbonate: 26.8 mmol/L (ref 20.0–28.0)
FIO2: 40
O2 Saturation: 88.1 %
Patient temperature: 98.6
pCO2 arterial: 45.8 mmHg (ref 32.0–48.0)
pH, Arterial: 7.385 (ref 7.350–7.450)
pO2, Arterial: 56.5 mmHg — ABNORMAL LOW (ref 83.0–108.0)

## 2021-04-29 LAB — MAGNESIUM: Magnesium: 2.7 mg/dL — ABNORMAL HIGH (ref 1.7–2.4)

## 2021-04-29 LAB — APTT: aPTT: 73 seconds — ABNORMAL HIGH (ref 24–36)

## 2021-04-29 LAB — HEPARIN LEVEL (UNFRACTIONATED): Heparin Unfractionated: 0.66 IU/mL (ref 0.30–0.70)

## 2021-04-29 MED ORDER — SODIUM PHOSPHATES 45 MMOLE/15ML IV SOLN
20.0000 mmol | Freq: Once | INTRAVENOUS | Status: AC
  Administered 2021-04-29: 20 mmol via INTRAVENOUS
  Filled 2021-04-29: qty 6.67

## 2021-04-29 MED ORDER — ALBUTEROL SULFATE (2.5 MG/3ML) 0.083% IN NEBU: 2.5000 mg | INHALATION_SOLUTION | Freq: Four times a day (QID) | RESPIRATORY_TRACT | Status: DC

## 2021-04-29 NOTE — Progress Notes (Addendum)
NAME:  Leslie Bryant MRN:  [id number] DOB:  11/02/1941 LOS: 46 ADMISSION DATE:  04/08/2021 CONSULTATION DATE: 04/20/2021 REFERRING MD:  Alfredia Ferguson - TRH CHIEF COMPLAINT:  SOB, hypoxic respiratory failure   Brief Narrative   80 year old female presented with presented to [facility] 6/16 via EMS for worsening SOB, hypoxia and concern for PNA.On ED evaluation patient was hypoxic on RA and in A-fib RVR .CXR demonsrated bilateral lower lobe infiltrates. Labs were notable for Na 123, BNP 467, Trop 20 and WBC 15K. Patient was admitted to [facility].  PCCM consulted 6/17 for ongoing hypoxemic respiratory failure despite supplemental O2.  Pertinent Medical History:  A-fib Right breast cancer  Bowel perforation  HLD  Significant Hospital Events: Including procedures, antibiotic start and stop dates in addition to other pertinent events   6/16 - Admitted to [facility] for SOB, hypoxia, multifocal PNA 6/17 - PCCM consulted for persistent hypoxia, mild lethargy, ICU admission -> intubated later in day. CT chest with bilateral diffuse GGO.  Failed PICC line insertion 6/18 AM - worsening AKI - creat 1.8. on vent 80% fio2, supnine. Sedated with fent gtt, versed gtt. On pressors neo. PCT high but culture negative so far. HS trop in 20s. RASS -4. On protoni and coffee ground mild returns noted this morning 6/18 PM - -in renal failure. Worsening creat. Dropping Ur op despite fluids. Bronch BAL -> 86% polys.  Alv  hge ruled out.  Elliquis now lower dose. Code changed to DNR + full medical care 6/19 renal consulted.  6/20 Remains sedated on vent minimally responsive on fentanyl drip with worsening renal failure, now off pressors 6/21 progressive multi - organ dysfunction including worsening renal failure, HD cath placed Right IJ. CRRT started.   mild liver injury with slight transaminitis, and continued encephalopathy. Cefepime completed.  6/22 amiodarone had to be restarted for af w/ RVR 6/23 starting Proning ->improved oxygenation and  vent mechanics. Started low dose lopressor. Feeding tube placed post-pyloric  6/26 no longer requiring proning, FiO2 40%  Interim History / Subjective:  In supine position Oxygenation is stable Discussed with spouse at bedside  Objective:  Blood pressure (!) 134/48, pulse (!) 112, temperature 98.6 F (37 C), temperature source Axillary, resp. rate (!) 28, height _0  (1.575 m), weight 73.8 kg, SpO2 99 %.    Vent Mode: PRVC FiO2 (%):  [40 %] 40 % Set Rate:  [35 bmp] 35 bmp Vt Set:  [350 mL] 350 mL PEEP:  [8 cmH20] 8 cmH20 Plateau Pressure:  [20 cmH20-27 cmH20] 27 cmH20   Intake/Output Summary (Last 24 hours) at 04/29/2021 0948 Last data filed at 04/29/2021 6283 Gross per 24 hour  Intake 2069.56 ml  Output 2636.1 ml  Net -566.54 ml   Filed Weights   04/26/21 0423 04/27/21 0500 04/28/21 0439  Weight: 80.1 kg 80.2 kg 73.8 kg    Physical Exam General: Sedated with fentanyl and Versed on full vent support, weaning sedation HEENT: Periorbital ecchymosis, orally intubated Pulmonary: Decreased air entry bilaterally, no added sounds Cardiac: S1-S2 appreciated Abdomen: Soft, bowel sounds appreciated Extremities: Peripheral edema Neuro: Sedated  Labs/imaging that I have personally reviewed:  See below   Resolved Hospital Problem List:   Septic shock-Pressors stopped 6/19 Klebsiella UTI, POA  Assessment & Plan:   Acute metabolic encephalopathy History of giant cell arteritis on chronic steroids -Continue supportive care -Continue prednisone -Wean sedation as able  Atrial fibrillation on amiodarone New onset right ventricular failure on admission -Continue amiodarone -Low-dose Lopressor via tube -Continue  heparin -Echocardiogram once more stable  Acute respiratory failure due to ARDS Community-acquired pneumonia UTI with Klebsiella -Continue antibiotics  T-max 100.9 -Culture if temperature greater than 101  Acute respiratory failure ARDS Continue mechanical  ventilation per ARDS protocol Target TVol 6-8cc/kgIBW Target Plateau Pressure < 30cm H20 Target driving pressure less than 15 cm of water Target PaO2 55-65: titrate PEEP/FiO2 per protocol As long as PaO2 to FiO2 ratio is less than 1:150 position in prone position for 16 hours a day Ventilator associated pneumonia prevention protocol  Transaminitis -Continue to trend  Acute kidney injury-present on admission Oliguric with renal failure -On CRRT, started 6/21 -Avoid nephrotoxic's -Maintain renal perfusion -Replete electrolytes  Hypoglycemia -Continue SSI  Anemia -Continue to trend CBC -Transfuse per protocol as needed  Will sedation off today to see what she is able to do  Best Practice   Diet/type: tubefeeds Pain/Anxiety/Delirium protocol RASS goal: -1 to -2 VAP protocol (if indicated): Yes DVT prophylaxis: systemic heparin GI prophylaxis: PPI Glucose control:  SSI and Basal coverage - increased due to poor glycemic control  Central venous access:  Yes, and it is still needed   Arterial line:  N/A Foley:  Yes, and it is no longer needed- Mobility:  bed rest  PT consulted: N/A Studies pending: None Culture data pending:none  Last reviewed culture data:today Antibiotics:not indicated.  Antibiotic de-escalation: N/A Stop date: N/A Code Status:  DNR Last date of multidisciplinary goals of care discussion:Update family daily,  ccm prognosis: Life-threating Disposition: remains critically ill, will stay in intensive care  Discussed with patient and spouse at bedside  The patient is critically ill with multiple organ systems failure and requires high complexity decision making for assessment and support, frequent evaluation and titration of therapies, application of advanced monitoring technologies and extensive interpretation of multiple databases. Critical Care Time devoted to patient care services described in this note independent of APP/resident time (if applicable)   is 32 minutes.   Sherrilyn Rist MD [place] [facility] Personal pager: [phone] If unanswered, please page CCM On-call: [phone]

## 2021-04-29 NOTE — Progress Notes (Signed)
Admit: 04/12/2021 LOS: 10  33F nl GFR with anuric AKI on CRRT in setting of CAP/ARDS/VDRF, shock now off pressors, hx/o giant cell arteritis  Subjective:  Started CRRT 6/21 -  running ok-  pre and post now 2K  Minus 612 yest-  UF taken down when needed pressors-  seem to be off as of this AM No UOP FIO2 stable  at 40%   06/25 0701 - 06/26 0700 In: 2158.4 [I.V.:943.4; NG/GT:1215] Out: 2771   Filed Weights   04/26/21 0423 04/27/21 0500 04/28/21 0439  Weight: 80.1 kg 80.2 kg 73.8 kg    Scheduled Meds:  amiodarone  200 mg Per Tube Daily   artificial tears  1 application Both Eyes [id number]   chlorhexidine gluconate (MEDLINE KIT)  15 mL Mouth Rinse BID   Chlorhexidine Gluconate Cloth  6 each Topical Daily   feeding supplement (PROSource TF)  45 mL Per Tube QID   insulin aspart  0-15 Units Subcutaneous Q4H   insulin aspart  5 Units Subcutaneous Q4H   insulin glargine  10 Units Subcutaneous Daily   ipratropium  0.5 mg Nebulization Q6H   levalbuterol  0.63 mg Nebulization Q6H   mouth rinse  15 mL Mouth Rinse 10 times per day   metoprolol tartrate  25 mg Per Tube BID   multivitamin  15 mL Per Tube Daily   pantoprazole sodium  40 mg Per Tube BID   predniSONE  10 mg Per Tube BID WC   sodium chloride flush  10-40 mL Intracatheter Q12H   Continuous Infusions:  sodium chloride Stopped (04/28/21 0829)   sodium chloride 10 mL/hr at 04/27/21 2200   feeding supplement (VITAL 1.5 CAL) 1,000 mL (04/27/21 1830)   fentaNYL infusion INTRAVENOUS 75 mcg/hr (04/29/21 1100)   heparin 1,050 Units/hr (04/29/21 1100)   midazolam Stopped (04/28/21 1359)   phenylephrine (NEO-SYNEPHRINE) Adult infusion Stopped (04/29/21 0258)   prismasol BGK 2/2.5 replacement solution 500 mL/hr at 04/29/21 0611   prismasol BGK 2/2.5 replacement solution 500 mL/hr at 04/29/21 0823   prismasol BGK 4/2.5 1,500 mL/hr at 04/29/21 0835   PRN Meds:.Place/Maintain arterial line **AND** sodium chloride, acetaminophen,  acetaminophen, cisatracurium (NIMBEX) injection, docusate, fentaNYL, heparin, midazolam **AND** midazolam, polyethylene glycol, sodium chloride flush  Current Labs: reviewed  6/16 UA w/o hematuria, trace protein 6/19 Renal US no HN  Physical Exam:  Blood pressure (!) 100/42, pulse 100, temperature 98.6 F (37 C), temperature source Axillary, resp. rate (!) 29, height 5' 2"  (1.575 m), weight 73.8 kg, SpO2 98 %. Intubated sedated Tachy IRIR Coarse bs b/l 1+ LEE S/nt/nd No rashes/lesions ETT in place, NCAT  A Anuric AKI, likely ATN in setting of #2 and #3. Given prolonged anuric state, progressive azotemia, and hypervolemia started CRRT 6/21 after discussing with family.  Tolerated so far, not clotting. UF had been increased with good results until 6/25 when pressors had to be restarted so now back to running even. Systemic heparin for #7.  No evidence of GFR recovery yet.  ARDS/CAP/VDRF: per CCM,  cont with UF with CRRT-  need to continue CRRT given tenuous state -  no change in prescription  Septic Shock, off pressors but had to be resumed 6/25--- now off again Giant Cell arteritis, chronic IS, now on stress dosing Anemia, transfusion per primary, for pRBC 6/22 [name] UTI: ABX per CCM-  WBC going up-  mild fever AFib on hep gtt Hypervolemia-  had good success with UF over last few days but now that pressors resumed  will back off to keeping even Mild hyperkalemia, now 2 k pre and post-  phos of 2.7-  will give some  P  Daily reassessment, if increasingly unlikely for her to make quick improvements family would want to rediscuss [place] reasonable to think supportive care and time can give some GFR recovery, but not yet  Louis Meckel  04/29/2021, 11:26 AM  Recent Labs  Lab 04/28/21 0440 04/28/21 1600 04/29/21 0737  NA 135 137 137  K 4.5 5.0 4.4  CL 101 102 101  CO2 26 28 28   GLUCOSE 166* 211* 105*  BUN 33* 32* 33*  CREATININE 1.08* 0.86 0.82  CALCIUM 8.5*  8.7* 8.4*  PHOS 3.2 2.4* 2.7   Recent Labs  Lab 04/27/21 0520 04/28/21 0402 04/28/21 0440 04/29/21 0330  WBC 13.9*  --  15.5* 16.7*  HGB 8.4* 10.5* 9.6* 9.3*  HCT 27.1* 31.0* 31.9* 30.3*  MCV 102.3*  --  103.6* 102.7*  PLT 139*  --  140* 142*

## 2021-04-29 NOTE — Progress Notes (Signed)
[place] for Heparin Indication: atrial fibrillation  Allergies  Allergen Reactions   Crestor [Rosuvastatin Calcium] Other (See Comments)    Shoulder pain   Crestor [Rosuvastatin]     Other reaction(s): Unknown   Lipitor [Atorvastatin Calcium] Other (See Comments)    Myalgias    Pravachol [Pravastatin] Other (See Comments)    myalgias   Penicillins Itching    Has patient had a PCN reaction causing immediate rash, facial/tongue/throat swelling, SOB or lightheadedness with hypotension: No Has patient had a PCN reaction causing severe rash involving mucus membranes or skin necrosis: No Has patient had a PCN reaction that required hospitalization No Has patient had a PCN reaction occurring within the last 10 years: No If all of the above answers are "NO", then may proceed with Cephalosporin use.     Patient Measurements: Height: _0  (157.5 cm) Weight: 73.8 kg (162 lb 11.2 oz) IBW/kg (Calculated) : 50.1 Heparin Dosing Weight: 67 kg  Vital Signs: Temp: 97.8 F (36.6 C) (06/26 0330) Temp Source: Axillary (06/26 0330) BP: 150/105 (06/26 0700) Pulse Rate: 92 (06/26 0700)  Labs: Recent Labs    04/27/21 0520 04/27/21 0521 04/28/21 0402 04/28/21 0440 04/28/21 1600 04/29/21 0330  HGB 8.4*  --  10.5* 9.6*  --  9.3*  HCT 27.1*  --  31.0* 31.9*  --  30.3*  PLT 139*  --   --  140*  --  142*  APTT 84*  --   --  70*  --  73*  HEPARINUNFRC 0.95*  --   --  0.69  --   --   CREATININE  --    < > 0.80 1.08* 0.86  --    < > = values in this interval not displayed.    Estimated Creatinine Clearance: 49.1 mL/min (by C-G formula based on SCr of 0.86 mg/dL).  Medications:  Scheduled:   amiodarone  200 mg Per Tube Daily   artificial tears  1 application Both Eyes O1Y   chlorhexidine gluconate (MEDLINE KIT)  15 mL Mouth Rinse BID   Chlorhexidine Gluconate Cloth  6 each Topical Daily   feeding supplement (PROSource TF)  45 mL Per Tube QID    insulin aspart  0-15 Units Subcutaneous Q4H   insulin aspart  5 Units Subcutaneous Q4H   insulin glargine  10 Units Subcutaneous Daily   ipratropium  0.5 mg Nebulization Q6H   levalbuterol  0.63 mg Nebulization Q6H   mouth rinse  15 mL Mouth Rinse 10 times per day   metoprolol tartrate  25 mg Per Tube BID   multivitamin  15 mL Per Tube Daily   pantoprazole sodium  40 mg Per Tube BID   predniSONE  10 mg Per Tube BID WC   sodium chloride flush  10-40 mL Intracatheter Q12H   Infusions:   sodium chloride Stopped (04/28/21 0829)   sodium chloride 10 mL/hr at 04/27/21 2200   feeding supplement (VITAL 1.5 CAL) 1,000 mL (04/27/21 1830)   fentaNYL infusion INTRAVENOUS 125 mcg/hr (04/29/21 0700)   heparin 1,050 Units/hr (04/29/21 0700)   midazolam Stopped (04/28/21 1359)   phenylephrine (NEO-SYNEPHRINE) Adult infusion Stopped (04/29/21 0258)   prismasol BGK 2/2.5 replacement solution 500 mL/hr at 04/29/21 0611   prismasol BGK 2/2.5 replacement solution 500 mL/hr at 04/28/21 2203   prismasol BGK 4/2.5 1,500 mL/hr at 04/29/21 0507    Assessment: 60 yoF admit 6/16 with ShOB, DOE, recurrent falls, BLE edema. PMH includes Afib on  apixaban.  Due to worsening renal function, she was changed to heparin per pharmacy on 6/19.  She started CRRT on 6/21. Recent DOAC and AKI will falsely elevate Heparin levels; monitor and titrate heparin dose using aPTT until levels correlate.  Today, 04/29/2021:  aPTT 73, therapeutic on heparin 1050 units/hr HL 0.66, trending down as apixaban is cleared, now correlating with aPTT Hgb remains low/stable 9.3, Plts 142k SCr down to 0.86 No bleeding complications and no line issues reported.  No CRRT filter clotting or complications.   Goal of Therapy:  Heparin level 0.3-0.7 units/ml aPTT 66-102 seconds Monitor platelets by anticoagulation protocol: Yes   Plan:  Continue heparin infusion at 1050 units/hr Daily HL and CBC  Gretta Arab PharmD, BCPS Clinical  Pharmacist [facility] main pharmacy [phone] 04/29/2021 7:11 AM

## 2021-04-29 NOTE — Progress Notes (Signed)
Rt was in pt room talking to family. Pt vent started alarming. Pt was not getting a return volume and was having desaturations. Rt took pt off vent and bagged/lavage. Pt had mucus plugs when suction. Rt placed pt back on vent. Pt now has good return volumes and normal vitals. No further complications at this time. MD called and RN was at bedside.

## 2021-04-30 ENCOUNTER — Encounter (HOSPITAL_COMMUNITY): Payer: Medicare Other

## 2021-04-30 ENCOUNTER — Inpatient Hospital Stay (HOSPITAL_COMMUNITY): Payer: Medicare Other

## 2021-04-30 DIAGNOSIS — I724 Aneurysm of artery of lower extremity: Secondary | ICD-10-CM

## 2021-04-30 DIAGNOSIS — Z9911 Dependence on respirator [ventilator] status: Secondary | ICD-10-CM

## 2021-04-30 DIAGNOSIS — Z978 Presence of other specified devices: Secondary | ICD-10-CM

## 2021-04-30 DIAGNOSIS — T148XXA Other injury of unspecified body region, initial encounter: Secondary | ICD-10-CM

## 2021-04-30 DIAGNOSIS — J9690 Respiratory failure, unspecified, unspecified whether with hypoxia or hypercapnia: Secondary | ICD-10-CM

## 2021-04-30 LAB — RENAL FUNCTION PANEL
Albumin: 1.8 g/dL — ABNORMAL LOW (ref 3.5–5.0)
Albumin: 1.7 g/dL — ABNORMAL LOW (ref 3.5–5.0)
Anion gap: 8 (ref 5–15)
Anion gap: 9 (ref 5–15)
BUN: 33 mg/dL — ABNORMAL HIGH (ref 8–23)
BUN: 36 mg/dL — ABNORMAL HIGH (ref 8–23)
CO2: 28 mmol/L (ref 22–32)
CO2: 27 mmol/L (ref 22–32)
Calcium: 8.4 mg/dL — ABNORMAL LOW (ref 8.9–10.3)
Calcium: 8.2 mg/dL — ABNORMAL LOW (ref 8.9–10.3)
Chloride: 101 mmol/L (ref 98–111)
Chloride: 101 mmol/L (ref 98–111)
Creatinine, Ser: 0.89 mg/dL (ref 0.44–1.00)
Creatinine, Ser: 0.78 mg/dL (ref 0.44–1.00)
GFR, Estimated: >60 mL/min (ref >60)
GFR, Estimated: >60 mL/min (ref >60)
Glucose, Bld: 185 mg/dL — ABNORMAL HIGH (ref 70–99)
Glucose, Bld: 238 mg/dL — ABNORMAL HIGH (ref 70–99)
Phosphorus: 2.8 mg/dL (ref 2.5–4.6)
Phosphorus: 2.4 mg/dL — ABNORMAL LOW (ref 2.5–4.6)
Potassium: 4.5 mmol/L (ref 3.5–5.1)
Potassium: 4.2 mmol/L (ref 3.5–5.1)
Sodium: 137 mmol/L (ref 135–145)
Sodium: 137 mmol/L (ref 135–145)

## 2021-04-30 LAB — GLUCOSE, CAPILLARY
Glucose-Capillary: 178 mg/dL — ABNORMAL HIGH (ref 70–99)
Glucose-Capillary: 193 mg/dL — ABNORMAL HIGH (ref 70–99)
Glucose-Capillary: 222 mg/dL — ABNORMAL HIGH (ref 70–99)
Glucose-Capillary: 223 mg/dL — ABNORMAL HIGH (ref 70–99)
Glucose-Capillary: 261 mg/dL — ABNORMAL HIGH (ref 70–99)
Glucose-Capillary: 391 mg/dL — ABNORMAL HIGH (ref 70–99)
Glucose-Capillary: 86 mg/dL (ref 70–99)

## 2021-04-30 LAB — BLOOD CULTURE ID PANEL (REFLEXED) - BCID2

## 2021-04-30 LAB — CBC
HCT: 30.8 % — ABNORMAL LOW (ref 36.0–46.0)
Hemoglobin: 9.5 g/dL — ABNORMAL LOW (ref 12.0–15.0)
MCH: 31.5 pg (ref 26.0–34.0)
MCHC: 30.8 g/dL (ref 30.0–36.0)
MCV: 102 fL — ABNORMAL HIGH (ref 80.0–100.0)
Platelets: 150 10*3/uL (ref 150–400)
RBC: 3.02 MIL/uL — ABNORMAL LOW (ref 3.87–5.11)
RDW: 16.4 % — ABNORMAL HIGH (ref 11.5–15.5)
WBC: 15.9 10*3/uL — ABNORMAL HIGH (ref 4.0–10.5)
nRBC: 9 % — ABNORMAL HIGH (ref 0.0–0.2)

## 2021-04-30 LAB — MAGNESIUM: Magnesium: 2.8 mg/dL — ABNORMAL HIGH (ref 1.7–2.4)

## 2021-04-30 LAB — ANTINUCLEAR ANTIBODIES, IFA: ANA Ab, IFA: NEGATIVE

## 2021-04-30 LAB — HEPARIN LEVEL (UNFRACTIONATED): Heparin Unfractionated: 0.51 IU/mL (ref 0.30–0.70)

## 2021-04-30 MED ORDER — FENTANYL CITRATE (PF) 100 MCG/2ML IJ SOLN
12.5000 ug | INTRAMUSCULAR | Status: DC | PRN
  Administered 2021-05-01: 12.5 ug via INTRAVENOUS
  Administered 2021-05-01: 25 ug via INTRAVENOUS
  Administered 2021-05-01: 12.5 ug via INTRAVENOUS
  Administered 2021-05-02 (×2): 25 ug via INTRAVENOUS
  Filled 2021-04-30 (×3): qty 2

## 2021-04-30 MED ORDER — ALBUMIN HUMAN 25 % IV SOLN
12.5000 g | Freq: Once | INTRAVENOUS | Status: AC
  Administered 2021-04-30: 12.5 g via INTRAVENOUS
  Filled 2021-04-30: qty 50

## 2021-04-30 MED ORDER — VANCOMYCIN HCL 1500 MG/300ML IV SOLN
1500.0000 mg | Freq: Once | INTRAVENOUS | Status: AC
  Administered 2021-04-30: 1500 mg via INTRAVENOUS
  Filled 2021-04-30: qty 300

## 2021-04-30 MED ORDER — FENTANYL CITRATE (PF) 100 MCG/2ML IJ SOLN
25.0000 ug | INTRAMUSCULAR | Status: DC | PRN
  Administered 2021-04-30 (×2): 50 ug via INTRAVENOUS
  Filled 2021-04-30: qty 2

## 2021-04-30 MED ORDER — PHENYLEPHRINE HCL-NACL 10-0.9 MG/250ML-% IV SOLN
INTRAVENOUS | Status: AC
  Filled 2021-04-30: qty 250

## 2021-04-30 MED ORDER — POLYETHYLENE GLYCOL 3350 17 G PO PACK
17.0000 g | PACK | Freq: Every day | ORAL | Status: DC
  Administered 2021-05-01 – 2021-05-03 (×3): 17 g
  Filled 2021-04-30 (×4): qty 1

## 2021-04-30 MED ORDER — INSULIN ASPART 100 UNIT/ML IJ SOLN
7.0000 [IU] | INTRAMUSCULAR | Status: DC
  Administered 2021-04-30 – 2021-05-07 (×40): 7 [IU] via SUBCUTANEOUS

## 2021-04-30 MED ORDER — DOCUSATE SODIUM 50 MG/5ML PO LIQD
100.0000 mg | Freq: Every day | ORAL | Status: DC
  Administered 2021-05-01 – 2021-05-03 (×3): 100 mg
  Filled 2021-04-30 (×4): qty 10

## 2021-04-30 MED ORDER — PHENYLEPHRINE HCL-NACL 10-0.9 MG/250ML-% IV SOLN
0.0000 ug/min | INTRAVENOUS | Status: DC
  Administered 2021-04-30: 20 ug/min via INTRAVENOUS
  Administered 2021-05-01: 60 ug/min via INTRAVENOUS
  Filled 2021-04-30: qty 250

## 2021-04-30 MED ORDER — DEXMEDETOMIDINE HCL IN NACL 200 MCG/50ML IV SOLN
0.4000 ug/kg/h | INTRAVENOUS | Status: DC
  Administered 2021-04-30: 0.4 ug/kg/h via INTRAVENOUS
  Administered 2021-04-30: 0.5 ug/kg/h via INTRAVENOUS
  Administered 2021-04-30: 0.4 ug/kg/h via INTRAVENOUS
  Filled 2021-04-30: qty 100
  Filled 2021-04-30 (×2): qty 50

## 2021-04-30 NOTE — Progress Notes (Signed)
[place] Progress Note Patient Name: Leslie Bryant DOB: 1941-03-25 MRN: [id number]   Date of Service  04/30/2021  HPI/Events of Note  Nursing notes a hard hematoma over old R Femoral A-line site which was removed several days ago. Patient on a Heparin IV infusion for AFIB. Hgb stable at 9.5.   eICU Interventions  Plan: Continue to monitor hematoma size.     Intervention Category Major Interventions: Other:  Lysle Dingwall 04/30/2021, 4:53 AM

## 2021-04-30 NOTE — Progress Notes (Signed)
Patient in wean mode and tolerating well . Psv 8 peep 5  per physician

## 2021-04-30 NOTE — Plan of Care (Signed)
  Problem: Clinical Measurements: Goal: Will remain free from infection Outcome: Progressing   Problem: Nutrition: Goal: Adequate nutrition will be maintained Outcome: Progressing   

## 2021-04-30 NOTE — Progress Notes (Signed)
Admit: 04/24/2021 LOS: 11  59F nl GFR with anuric AKI on CRRT in setting of CAP/ARDS/VDRF, shock now off pressors, hx/o giant cell arteritis  Subjective:  Started CRRT 6/21 I/O even since admission No UOP FIO2 stable  at 40%   06/26 0701 - 06/27 0700 In: 1909.7 [I.V.:487.6; NG/GT:1165; IV Piggyback:257.1] Out: 2058.5   Filed Weights   04/27/21 0500 04/28/21 0439 04/30/21 0500  Weight: 80.2 kg 73.8 kg 74.1 kg    Scheduled Meds:  amiodarone  200 mg Per Tube Daily   artificial tears  1 application Both Eyes X1G   chlorhexidine gluconate (MEDLINE KIT)  15 mL Mouth Rinse BID   Chlorhexidine Gluconate Cloth  6 each Topical Daily   [START ON 05/01/2021] docusate  100 mg Per Tube Daily   feeding supplement (PROSource TF)  45 mL Per Tube QID   insulin aspart  0-15 Units Subcutaneous Q4H   insulin aspart  5 Units Subcutaneous Q4H   insulin glargine  10 Units Subcutaneous Daily   ipratropium  0.5 mg Nebulization Q6H   levalbuterol  0.63 mg Nebulization Q6H   mouth rinse  15 mL Mouth Rinse 10 times per day   metoprolol tartrate  25 mg Per Tube BID   multivitamin  15 mL Per Tube Daily   pantoprazole sodium  40 mg Per Tube BID   [START ON 05/01/2021] polyethylene glycol  17 g Per Tube Daily   predniSONE  10 mg Per Tube BID WC   sodium chloride flush  10-40 mL Intracatheter Q12H   Continuous Infusions:  sodium chloride Stopped (04/29/21 2006)   sodium chloride 10 mL/hr at 04/27/21 2200   dexmedetomidine (PRECEDEX) IV infusion 0.4 mcg/kg/hr (04/30/21 1200)   feeding supplement (VITAL 1.5 CAL) 1,000 mL (04/27/21 1830)   heparin 1,050 Units/hr (04/30/21 1200)   prismasol BGK 2/2.5 replacement solution 500 mL/hr at 04/30/21 0502   prismasol BGK 2/2.5 replacement solution 500 mL/hr at 04/30/21 0305   prismasol BGK 4/2.5 1,500 mL/hr at 04/30/21 1217   PRN Meds:.Place/Maintain arterial line **AND** sodium chloride, acetaminophen, acetaminophen, cisatracurium (NIMBEX) injection, fentaNYL,  heparin, sodium chloride flush  Current Labs: reviewed  6/16 UA w/o hematuria, trace protein 6/19 Renal US no HN  Physical Exam:  Blood pressure (!) 114/51, pulse (!) 101, temperature 100.3 F (37.9 C), temperature source Axillary, resp. rate (!) 26, height _0  (1.575 m), weight 74.1 kg, SpO2 97 %. Intubated sedated Tachy IRIR Coarse bs b/l 1+ LEE S/nt/nd No rashes/lesions ETT in place, NCAT  Assessment AKI - anuric, likely ATN in setting of #2 and #3. Given prolonged anuric state, progressive azotemia, and hypervolemia started CRRT 6/21 after discussing with family.  Tolerated so far, not clotting. UF had been increased with good results until 6/25 when pressors had to be restarted so now back to running even. Systemic heparin for #7.  No evidence of GFR recovery yet.  ARDS/CAP/VDRF: per CCM,  need to continue CRRT given tenuous state  Septic Shock:  off pressors now Giant Cell arteritis: chronic IS, now on stress dosing Anemia: transfusion per primary, for pRBC 6/22 [name] UTI: ABX per CCM-  WBC going up-  mild fever AFib- on hep gtt Hypervolemia-  is under admit wt 3kg and total I/O are even, keeping even for now due to BP drops 2 days ago   Plan Daily reassessment, if increasingly unlikely for her to make quick improvements family would want to rediscuss [place] reasonable to think supportive care and time can  give some GFR recovery, but not yet  Sol Blazing  04/30/2021, 12:52 PM  Recent Labs  Lab 04/29/21 0737 04/29/21 1600 04/30/21 0345  NA 137 136 137  K 4.4 4.7 4.5  CL 101 100 101  CO2 _0 GLUCOSE 105* 258* 185*  BUN 33* 34* 33*  CREATININE 0.82 0.81 0.89  CALCIUM 8.4* 8.3* 8.4*  PHOS 2.7 3.8 2.8    Recent Labs  Lab 04/28/21 0440 04/29/21 0330 04/30/21 0345  WBC 15.5* 16.7* 15.9*  HGB 9.6* 9.3* 9.5*  HCT 31.9* 30.3* 30.8*  MCV 103.6* 102.7* 102.0*  PLT 140* 142* 150

## 2021-04-30 NOTE — Progress Notes (Signed)
[place] Progress Note Patient Name: Leslie Bryant DOB: 11/09/1940 MRN: [id number]   Date of Service  04/30/2021  HPI/Events of Note  Hypotension - BP = 87/30 with MAP = 49 post Fentanyl 50 mcg IVP.  eICU Interventions  Plan: Monitor CVP now and Q 4 hours. Phenylephrine IV infusion. Titrate to MAP >= 65. Decrease Fentanyl dose to 12.5 to 25 mcg IV Q 15 minutes PRN.      Intervention Category Major Interventions: Hypotension - evaluation and management  Lysle Dingwall 04/30/2021, 10:53 PM

## 2021-04-30 NOTE — Progress Notes (Signed)
[place] for Heparin Indication: atrial fibrillation  Allergies  Allergen Reactions   Crestor [Rosuvastatin Calcium] Other (See Comments)    Shoulder pain   Crestor [Rosuvastatin]     Other reaction(s): Unknown   Lipitor [Atorvastatin Calcium] Other (See Comments)    Myalgias    Pravachol [Pravastatin] Other (See Comments)    myalgias   Penicillins Itching    Has patient had a PCN reaction causing immediate rash, facial/tongue/throat swelling, SOB or lightheadedness with hypotension: No Has patient had a PCN reaction causing severe rash involving mucus membranes or skin necrosis: No Has patient had a PCN reaction that required hospitalization No Has patient had a PCN reaction occurring within the last 10 years: No If all of the above answers are "NO", then may proceed with Cephalosporin use.     Patient Measurements: Height: 5\' 2"  (157.5 cm) Weight: 74.1 kg (163 lb 5.8 oz) IBW/kg (Calculated) : 50.1 Heparin Dosing Weight: 67 kg  Vital Signs: Temp: 97.5 F (36.4 C) (06/27 0800) Temp Source: Axillary (06/27 0800) BP: 157/121 (06/27 0800) Pulse Rate: 103 (06/27 0800)  Labs: Recent Labs    04/28/21 0440 04/28/21 1600 04/29/21 0330 04/29/21 0737 04/29/21 1600 04/30/21 0345  HGB 9.6*  --  9.3*  --   --  9.5*  HCT 31.9*  --  30.3*  --   --  30.8*  PLT 140*  --  142*  --   --  150  APTT 70*  --  73*  --   --   --   HEPARINUNFRC 0.69  --   --  0.66  --  0.51  CREATININE 1.08*   < >  --  0.82 0.81 0.89   < > = values in this interval not displayed.    Estimated Creatinine Clearance: 47.5 mL/min (by C-G formula based on SCr of 0.89 mg/dL).  Assessment: 58 yoF admit 6/16 with ShOB, DOE, recurrent falls, BLE edema. PMH includes Afib on apixaban.  Due to worsening renal function, she was changed to heparin per pharmacy on 6/19.  She started CRRT on 6/21. Recent DOAC and AKI will falsely elevate Heparin levels; monitor and titrate  heparin dose using aPTT until levels correlate.  Today, 04/30/2021:   Heparin level 0.51 is therapeutic on heparin 1050 units/hrs Hgb remains low/stable 9.5, Plts 150k SCr 0.89 RN reports R groin with significant bruising where previous A line was. RN marked the site & monitoring hematoma size.  No CRRT filter clotting or complications.   Goal of Therapy:  Heparin level 0.3-0.7 units/ml aPTT 66-102 seconds Monitor platelets by anticoagulation protocol: Yes   Plan:  Continue heparin infusion at 1050 units/hr Daily HL and CBC Monitor R groin hematoma  Eudelia Bunch, Pharm.D 04/30/2021 8:58 AM

## 2021-04-30 NOTE — TOC Progression Note (Signed)
Transition of Care [facility]) - Progression Note    Patient Details  Name: Leslie Bryant MRN: [id number] Date of Birth: [birth year]  Transition of Care [facility] CM/SW Contact  Leeroy Cha, RN Phone Number: 04/30/2021, 8:29 AM  Clinical Narrative:    REMAINS ON THE VENT BUT IS WEANING, CONTINUES ON CRRT.  fOLLOWING FOR PROGRESSION.  Unable to determine toc needs at this time.   Expected Discharge Plan: Home/Self Care Barriers to Discharge: Continued Medical Work up  Expected Discharge Plan and Services Expected Discharge Plan: Home/Self Care   Discharge Planning Services: CM Consult   Living arrangements for the past 2 months: Single Family Home                                       Social Determinants of Health (SDOH) Interventions    Readmission Risk Interventions No flowsheet data found.

## 2021-04-30 NOTE — Progress Notes (Signed)
While lying pt down to do bladder scan noted R groin to have some significant bruising where previous A-line was, took bandage off to assess site and upon assessment and palpation site bruised and hard. Measured site and applied skin marker for monitoring hematoma size.

## 2021-04-30 NOTE — Progress Notes (Signed)
Pt placed on full support

## 2021-04-30 NOTE — Progress Notes (Signed)
R/O pseudoaneurysm arterial ultrasound has been completed.    Results can be found under chart review under CV PROC. 04/30/2021 2:05 PM Masako Overall RVT, RDMS

## 2021-04-30 NOTE — Progress Notes (Addendum)
NAME:  Leslie Bryant MRN:  [id number] DOB:  04-Sep-1941 LOS: 68 ADMISSION DATE:  04/26/2021 CONSULTATION DATE: 04/20/2021 REFERRING MD:  Alfredia Ferguson - TRH CHIEF COMPLAINT:  SOB, hypoxic respiratory failure   Brief Narrative   80 year old female presented with [facility] 6/16 via EMS for worsening SOB, hypoxia and concern for PNA.On ED evaluation patient was hypoxic on RA and in A-fib RVR .CXR demonsrated bilateral lower lobe infiltrates. Labs were notable for Na 123, BNP 467, Trop 20 and WBC 15K. Patient was admitted to [facility].  PCCM consulted 6/17 for ongoing hypoxemic respiratory failure despite supplemental O2.  Pertinent Medical History:  A-fib Right breast cancer  Bowel perforation  HLD  Significant Hospital Events: Including procedures, antibiotic start and stop dates in addition to other pertinent events   6/16 - Admitted to [facility] for SOB, hypoxia, multifocal PNA 6/17 - PCCM consulted for persistent hypoxia, mild lethargy, ICU admission -> intubated later in day. CT chest with bilateral diffuse GGO.  Failed PICC line insertion 6/18 AM - worsening AKI - creat 1.8. on vent 80% fio2, supnine. Sedated with fent gtt, versed gtt. On pressors neo. PCT high but culture negative so far. HS trop in 20s. RASS -4. On protoni and coffee ground mild returns noted this morning 6/18 PM - -in renal failure. Worsening creat. Dropping Ur op despite fluids. Bronch BAL -> 86% polys.  Alv  hge ruled out.  Elliquis now lower dose. Code changed to DNR + full medical care 6/19 renal consulted.  6/20 Remains sedated on vent minimally responsive on fentanyl drip with worsening renal failure, now off pressors 6/21 progressive multi - organ dysfunction including worsening renal failure, HD cath placed Right IJ. CRRT started.   mild liver injury with slight transaminitis, and continued encephalopathy. Cefepime completed.  6/22 amiodarone had to be restarted for af w/ RVR 6/23 starting Proning ->improved oxygenation and  vent mechanics. Started low dose lopressor. Feeding tube placed post-pyloric  6/26 no longer requiring proning, FiO2 40% 6/27 tolerating weaning, down to Fentanyl 84mg, off pressors and antibiotics,  Interim History / Subjective:  Oxygenation improving Able to tolerate SBT Labs stable  Still on CRRT and not making any urine  Objective:  Blood pressure (!) 157/121, pulse (!) 103, temperature (!) 96.8 F (36 C), resp. rate (!) 32, height _0  (1.575 m), weight 74.1 kg, SpO2 100 %.    Vent Mode: CPAP;PSV FiO2 (%):  [40 %] 40 % Set Rate:  [35 bmp] 35 bmp Vt Set:  [350 mL] 350 mL PEEP:  [5 cmH20-8 cmH20] 5 cmH20 Pressure Support:  [8 cmH20-10 cmH20] 8 cmH20 Plateau Pressure:  [14 cmH20-25 cmH20] 23 cmH20   Intake/Output Summary (Last 24 hours) at 04/30/2021 0820 Last data filed at 04/30/2021 0800 Gross per 24 hour  Intake 1933.6 ml  Output 2074.2 ml  Net -140.6 ml    Filed Weights   04/27/21 0500 04/28/21 0439 04/30/21 0500  Weight: 80.2 kg 73.8 kg 74.1 kg    General:  critically ill-appearing F, intubated and sedated HEENT: MM pink/moist, peri-orbital ecchymosis, ETT in place, pupils equal Neuro: slightly opens eyes to voice, triggering breaths on the vent but not otherwise following commands, pupils equal and responsive  CV: s1s2 tachycardic irregular, no m/r/g PULM:  TV 400's on PSV 8/5, decreased air movement in bilateral bases without rhonchi or wheezing  GI: soft, bsx4 active  Extremities: warm/dry,  Firm hematoma of the R groin, slightly increased in size compared to marking overnight,  no erythema suggestive of cellulitis Skin: no rashes or lesions    Labs/imaging that I have personally reviewed:  CBC BMP  Resolved Hospital Problem List:   Septic shock-Pressors stopped 6/19 Klebsiella UTI, POA  Assessment & Plan:    Acute Hypoxic Respiratory Failure Secondary to CAP and ARDS Intubated on 6/17 S/p proning Now supinated and able to tolerate PSV CXR with  slightly improved aeration Completed course of  Cefepime/Ceftriaxone on 6/21 P: -tolerated vent weaning today, however encephalopathy precludes extubation, stop Fentanyl gtt and start Precedex --Maintain full vent support with SAT/SBT as tolerated -titrate Vent setting to maintain SpO2 greater than or equal to 90%. -HOB elevated 30 degrees. -Target driving pressure less than 15 cm of water -Plateau pressures less than 30 cm H20.  -Follow chest x-ray, ABG prn.   -Bronchial hygiene and RT/bronchodilator protocol.       Acute metabolic encephalopathy History of giant cell arteritis on chronic steroids Prednisone held 6/17, had been on high dose for several months  P: -decrease sedation for SAT, trial Precedex -Steroids held on admission, but not hypotensive or hypoglycemic so will continue to hold -Had a fall PTA on anti-coagulation, head CT negative -check Ammonia level     Atrial fibrillation on amiodarone, HFpEF New onset right ventricular failure on admission Preserved EF on echo 6/17 P: -continue amiodarone and po Lopressor -on heparin for anti-coagulation, holding Apixaban for renal failure     Transaminitis -repeat LFT's in AM    Acute kidney injury-present on admission Oliguric with renal failure Nephrology following, monitoring for signs of renal recovery P: -continue CRRT, initiated 6/21 -maintain renal perfusion and monitor urine output -monitor electrolytes   Hypoglycemia -Continue SSI   Anemia -Continue to trend CBC -Transfuse per protocol as needed    RLE Hematoma of the proximal thigh Developed after A-line removal P: -check arterial doppler to evaluate for ongoing bleeding and monitor for expansion  Best Practice   Diet/type: tubefeeds Pain/Anxiety/Delirium protocol RASS goal: -1 to -2 VAP protocol (if indicated): Yes DVT prophylaxis: systemic heparin GI prophylaxis: PPI Glucose control:  SSI and Basal coverage  Central venous  access:  Yes, and it is still needed   Arterial line:  N/A Foley:  Yes, and it is no longer needed- Mobility:  bed rest  PT consulted: N/A Studies pending: Ultrasound arterial Culture data pending:none  Last reviewed culture data:today Antibiotics:not indicated.  Antibiotic de-escalation: N/A Stop date: N/A Code Status:  DNR Last date of multidisciplinary goals of care discussion:  Pt's daughter states that family has discussed ongoing iHD and would like to pursue if patient does not experience any renal recovery ccm prognosis: Life-threating Disposition: remains critically ill, will stay in intensive care     CRITICAL CARE Performed by: Otilio Carpen Vergia Chea   Total critical care time: 45 minutes  Critical care time was exclusive of separately billable procedures and treating other patients.  Critical care was necessary to treat or prevent imminent or life-threatening deterioration.  Critical care was time spent personally by me on the following activities: development of treatment plan with patient and/or surrogate as well as nursing, discussions with consultants, evaluation of patient's response to treatment, examination of patient, obtaining history from patient or surrogate, ordering and performing treatments and interventions, ordering and review of laboratory studies, ordering and review of radiographic studies, pulse oximetry and re-evaluation of patient's condition.   Otilio Carpen Keats Kingry, PA-C [facility] See Amion for pager If no response to pager ,  please call [phone] until 7pm After 7:00 pm call Elink  [phone]?[place]

## 2021-04-30 NOTE — Progress Notes (Signed)
[place] Progress Note Patient Name: Leslie Bryant DOB: [birth year] MRN: [id number]   Date of Service  04/30/2021  HPI/Events of Note  Micro lab reports Staph epi, MECA + in 1 of 2 bottles. Could likely be contaminent, however, in light of hemodynamic instability will start on Vancomycin.   eICU Interventions  Plan: Vancomycin per pharmacy consult.     Intervention Category Major Interventions: Infection - evaluation and management  Adriaan Maltese Eugene 04/30/2021, 11:04 PM

## 2021-04-30 NOTE — Progress Notes (Signed)
[place] Progress Note Patient Name: Leslie Bryant DOB: Mar 12, 1941 MRN: [id number]   Date of Service  04/30/2021  HPI/Events of Note  Hypovolemia - CVP = 6 and Albumin = 1.7.  eICU Interventions  Plan: 25% Albumin 12.5 gm IV now.      Intervention Category Major Interventions: Hypovolemia - evaluation and treatment with fluids  Delayna Sparlin Cornelia Copa 04/30/2021, 11:25 PM

## 2021-05-01 LAB — CULTURE, BLOOD (ROUTINE X 2): Special Requests: ADEQUATE

## 2021-05-01 LAB — HEPARIN LEVEL (UNFRACTIONATED): Heparin Unfractionated: 0.39 IU/mL (ref 0.30–0.70)

## 2021-05-01 LAB — RENAL FUNCTION PANEL
Albumin: 1.9 g/dL — ABNORMAL LOW (ref 3.5–5.0)
Albumin: 1.8 g/dL — ABNORMAL LOW (ref 3.5–5.0)
Anion gap: 7 (ref 5–15)
Anion gap: 7 (ref 5–15)
BUN: 30 mg/dL — ABNORMAL HIGH (ref 8–23)
BUN: 33 mg/dL — ABNORMAL HIGH (ref 8–23)
CO2: 27 mmol/L (ref 22–32)
CO2: 26 mmol/L (ref 22–32)
Calcium: 7.8 mg/dL — ABNORMAL LOW (ref 8.9–10.3)
Calcium: 7.9 mg/dL — ABNORMAL LOW (ref 8.9–10.3)
Chloride: 103 mmol/L (ref 98–111)
Chloride: 103 mmol/L (ref 98–111)
Creatinine, Ser: 0.54 mg/dL (ref 0.44–1.00)
Creatinine, Ser: 0.75 mg/dL (ref 0.44–1.00)
GFR, Estimated: >60 mL/min (ref >60)
GFR, Estimated: >60 mL/min (ref >60)
Glucose, Bld: 153 mg/dL — ABNORMAL HIGH (ref 70–99)
Glucose, Bld: 228 mg/dL — ABNORMAL HIGH (ref 70–99)
Phosphorus: 2.5 mg/dL (ref 2.5–4.6)
Phosphorus: 2.2 mg/dL — ABNORMAL LOW (ref 2.5–4.6)
Potassium: 3.8 mmol/L (ref 3.5–5.1)
Potassium: 4.4 mmol/L (ref 3.5–5.1)
Sodium: 137 mmol/L (ref 135–145)
Sodium: 136 mmol/L (ref 135–145)

## 2021-05-01 LAB — HEPATIC FUNCTION PANEL
ALT: 59 U/L — ABNORMAL HIGH (ref 0–44)
AST: 59 U/L — ABNORMAL HIGH (ref 15–41)
Albumin: 1.9 g/dL — ABNORMAL LOW (ref 3.5–5.0)
Alkaline Phosphatase: 125 U/L (ref 38–126)
Bilirubin, Direct: 0.1 mg/dL (ref 0.0–0.2)
Indirect Bilirubin: 0.5 mg/dL (ref 0.3–0.9)
Total Bilirubin: 0.6 mg/dL (ref 0.3–1.2)
Total Protein: 6.3 g/dL — ABNORMAL LOW (ref 6.5–8.1)

## 2021-05-01 LAB — CBC
HCT: 27.7 % — ABNORMAL LOW (ref 36.0–46.0)
Hemoglobin: 8.6 g/dL — ABNORMAL LOW (ref 12.0–15.0)
MCH: 31.9 pg (ref 26.0–34.0)
MCHC: 31 g/dL (ref 30.0–36.0)
MCV: 102.6 fL — ABNORMAL HIGH (ref 80.0–100.0)
Platelets: 147 10*3/uL — ABNORMAL LOW (ref 150–400)
RBC: 2.7 MIL/uL — ABNORMAL LOW (ref 3.87–5.11)
RDW: 16.5 % — ABNORMAL HIGH (ref 11.5–15.5)
WBC: 16 10*3/uL — ABNORMAL HIGH (ref 4.0–10.5)
nRBC: 4.8 % — ABNORMAL HIGH (ref 0.0–0.2)

## 2021-05-01 LAB — GLUCOSE, CAPILLARY
Glucose-Capillary: 104 mg/dL — ABNORMAL HIGH (ref 70–99)
Glucose-Capillary: 153 mg/dL — ABNORMAL HIGH (ref 70–99)
Glucose-Capillary: 184 mg/dL — ABNORMAL HIGH (ref 70–99)
Glucose-Capillary: 187 mg/dL — ABNORMAL HIGH (ref 70–99)
Glucose-Capillary: 188 mg/dL — ABNORMAL HIGH (ref 70–99)
Glucose-Capillary: 207 mg/dL — ABNORMAL HIGH (ref 70–99)

## 2021-05-01 LAB — MAGNESIUM: Magnesium: 2.6 mg/dL — ABNORMAL HIGH (ref 1.7–2.4)

## 2021-05-01 LAB — AMMONIA: Ammonia: 22 umol/L (ref 9–35)

## 2021-05-01 MED ORDER — VANCOMYCIN HCL 1000 MG/200ML IV SOLN
1000.0000 mg | INTRAVENOUS | Status: DC
  Administered 2021-05-02 (×2): 1000 mg via INTRAVENOUS
  Filled 2021-05-01 (×2): qty 200

## 2021-05-01 MED ORDER — FENTANYL 2500MCG IN NS 250ML (10MCG/ML) PREMIX INFUSION
0.0000 ug/h | INTRAVENOUS | Status: DC
Start: 2021-05-01 — End: 2021-05-01
  Administered 2021-05-01: 25 ug/h via INTRAVENOUS
  Filled 2021-05-01: qty 250

## 2021-05-01 MED ORDER — SODIUM CHLORIDE 0.9 % IV BOLUS
750.0000 mL | Freq: Once | INTRAVENOUS | Status: AC
  Administered 2021-05-01: 750 mL via INTRAVENOUS

## 2021-05-01 MED ORDER — DEXMEDETOMIDINE HCL IN NACL 200 MCG/50ML IV SOLN
0.4000 ug/kg/h | INTRAVENOUS | Status: DC
  Administered 2021-05-01: 1 ug/kg/h via INTRAVENOUS
  Administered 2021-05-01 (×2): 0.4 ug/kg/h via INTRAVENOUS
  Administered 2021-05-02 (×2): 1.2 ug/kg/h via INTRAVENOUS
  Filled 2021-05-01 (×5): qty 50

## 2021-05-01 MED ORDER — MIDAZOLAM 50MG/50ML (1MG/ML) PREMIX INFUSION
0.5000 mg/h | INTRAVENOUS | Status: DC
  Administered 2021-05-01: 0.5 mg/h via INTRAVENOUS
  Filled 2021-05-01: qty 50

## 2021-05-01 MED ORDER — LIP MEDEX EX OINT
TOPICAL_OINTMENT | CUTANEOUS | Status: DC | PRN
  Administered 2021-05-02 – 2021-05-04 (×4): 1 via TOPICAL
  Filled 2021-05-01: qty 7

## 2021-05-01 MED ORDER — PROSOURCE TF PO LIQD
90.0000 mL | Freq: Four times a day (QID) | ORAL | Status: DC
  Administered 2021-05-01 – 2021-05-07 (×24): 90 mL
  Filled 2021-05-01 (×21): qty 90

## 2021-05-01 MED ORDER — PHENYLEPHRINE CONCENTRATED 100MG/250ML (0.4 MG/ML) INFUSION SIMPLE
0.0000 ug/min | INTRAVENOUS | Status: DC
  Administered 2021-05-01: 20 ug/min via INTRAVENOUS
  Filled 2021-05-01: qty 250

## 2021-05-01 MED ORDER — RENA-VITE PO TABS
1.0000 | ORAL_TABLET | Freq: Every day | ORAL | Status: DC
  Administered 2021-05-01 – 2021-05-06 (×6): 1 via NASOGASTRIC
  Filled 2021-05-01 (×6): qty 1

## 2021-05-01 MED ORDER — INSULIN GLARGINE 100 UNIT/ML ~~LOC~~ SOLN
15.0000 [IU] | Freq: Every day | SUBCUTANEOUS | Status: DC
  Administered 2021-05-02 – 2021-05-06 (×5): 15 [IU] via SUBCUTANEOUS
  Filled 2021-05-01 (×6): qty 0.15

## 2021-05-01 NOTE — Progress Notes (Signed)
Admit: 04/25/2021 LOS: 12  77F nl GFR with anuric AKI on CRRT in setting of CAP/ARDS/VDRF, shock now off pressors, hx/o giant cell arteritis  Subjective:  Started CRRT 6/21 I/O even since admission No UOP FIO2 stable  at 40% Had low BP's episode last night, CVP 6, got IV albumin   06/27 0701 - 06/28 0700 In: 2332.8 [I.V.:824.1; NG/GT:1210; IV Piggyback:298.7] Out: 795.9   Filed Weights   04/28/21 0439 04/30/21 0500 05/01/21 0500  Weight: 73.8 kg 74.1 kg 75.2 kg    Scheduled Meds:  amiodarone  200 mg Per Tube Daily   chlorhexidine gluconate (MEDLINE KIT)  15 mL Mouth Rinse BID   Chlorhexidine Gluconate Cloth  6 each Topical Daily   docusate  100 mg Per Tube Daily   feeding supplement (PROSource TF)  90 mL Per Tube QID   insulin aspart  0-15 Units Subcutaneous Q4H   insulin aspart  7 Units Subcutaneous Q4H   insulin glargine  10 Units Subcutaneous Daily   ipratropium  0.5 mg Nebulization Q6H   levalbuterol  0.63 mg Nebulization Q6H   mouth rinse  15 mL Mouth Rinse 10 times per day   metoprolol tartrate  25 mg Per Tube BID   multivitamin  1 tablet Per NG tube QHS   pantoprazole sodium  40 mg Per Tube BID   polyethylene glycol  17 g Per Tube Daily   predniSONE  10 mg Per Tube BID WC   sodium chloride flush  10-40 mL Intracatheter Q12H   Continuous Infusions:  sodium chloride Stopped (05/01/21 0346)   sodium chloride 10 mL/hr at 04/27/21 2200   dexmedetomidine (PRECEDEX) IV infusion 0.4 mcg/kg/hr (05/01/21 1500)   feeding supplement (VITAL 1.5 CAL) 1,000 mL (05/01/21 0521)   heparin 1,050 Units/hr (05/01/21 1500)   phenylephrine (NEO-SYNEPHRINE) Adult infusion 40 mcg/min (05/01/21 1500)   prismasol BGK 2/2.5 replacement solution 500 mL/hr at 05/01/21 1214   prismasol BGK 2/2.5 replacement solution 500 mL/hr at 05/01/21 0156   prismasol BGK 4/2.5 1,500 mL/hr at 05/01/21 1457   sodium chloride     [START ON 05/02/2021] vancomycin     PRN Meds:.Place/Maintain arterial line  **AND** sodium chloride, acetaminophen, acetaminophen, fentaNYL (SUBLIMAZE) injection, heparin, sodium chloride flush  Current Labs: reviewed  6/16 UA w/o hematuria, trace protein 6/19 Renal US no HN  Physical Exam:  Blood pressure (!) 115/54, pulse 96, temperature 99.3 F (37.4 C), temperature source Axillary, resp. rate (!) 32, height 5' 2"  (1.575 m), weight 75.2 kg, SpO2 100 %. Intubated sedated Tachy IRIR Coarse bs b/l 1+ LEE S/nt/nd No rashes/lesions ETT in place, NCAT  Assessment AKI - anuric, likely ATN in setting of #2 and #3. Given prolonged anuric state, progressive azotemia, and hypervolemia started CRRT 6/21. Cont CRRT.  ARDS/CAP/VDRF: per CCM,  need to continue CRRT given tenuous state  Hypotension - w/ vol depletion. Will change to keep +50-75 cc/hr on cRRT and bolus 750 cc NS.  Giant Cell arteritis: chronic IS, now on stress dosing Anemia: transfusion per primary, for pRBC 6/22 [name] UTI: ABX per CCM-  WBC going up-  mild fever AFib- on hep gtt   Plan Remains reasonable to think supportive care and time can give some GFR recovery, but not yet  Sol Blazing  05/01/2021, 4:09 PM  Recent Labs  Lab 04/30/21 0345 04/30/21 1600 05/01/21 0400  NA 137 137 137  K 4.5 4.2 3.8  CL 101 101 103  CO2 28 27 27   GLUCOSE 185*  238* 153*  BUN 33* 36* 30*  CREATININE 0.89 0.78 0.54  CALCIUM 8.4* 8.2* 7.8*  PHOS 2.8 2.4* 2.5    Recent Labs  Lab 04/29/21 0330 04/30/21 0345 05/01/21 0400  WBC 16.7* 15.9* 16.0*  HGB 9.3* 9.5* 8.6*  HCT 30.3* 30.8* 27.7*  MCV 102.7* 102.0* 102.6*  PLT 142* 150 147*

## 2021-05-01 NOTE — Progress Notes (Signed)
NAME:  Leslie Bryant MRN:  [id number] DOB:  10/21/1941 LOS: 81 ADMISSION DATE:  04/21/2021 CONSULTATION DATE: 04/20/2021 REFERRING MD:  Alfredia Ferguson - TR[facility] CHIEF COMPLAINT:  SOB, hypoxic respiratory failure   Brief Narrative   80 year old female presented with presented to [facility] 6/16 via EMS for worsening SOB, hypoxia and concern for PNA.On ED evaluation patient was hypoxic on RA and in A-fib RVR .CXR demonsrated bilateral lower lobe infiltrates. Labs were notable for Na 123, BNP 467, Trop 20 and WBC 15K. Patient was admitted to [facility].  PCCM consulted 6/17 for ongoing hypoxemic respiratory failure despite supplemental O2.  Pertinent Medical History:  A-fib Right breast cancer  Bowel perforation  HLD  Significant Hospital Events: Including procedures, antibiotic start and stop dates in addition to other pertinent events   6/16 - Admitted to [facility] for SOB, hypoxia, multifocal PNA 6/17 - PCCM consulted for persistent hypoxia, mild lethargy, ICU admission -> intubated later in day. CT chest with bilateral diffuse GGO.  Failed PICC line insertion 6/18 AM - worsening AKI - creat 1.8. on vent 80% fio2, supnine. Sedated with fent gtt, versed gtt. On pressors neo. PCT high but culture negative so far. HS trop in 20s. RASS -4. On protoni and coffee ground mild returns noted this morning 6/18 PM - -in renal failure. Worsening creat. Dropping Ur op despite fluids. Bronch BAL -> 86% polys.  Alv  hge ruled out.  Elliquis now lower dose. Code changed to DNR + full medical care 6/19 renal consulted.  6/20 Remains sedated on vent minimally responsive on fentanyl drip with worsening renal failure, now off pressors 6/21 progressive multi - organ dysfunction including worsening renal failure, HD cath placed Right IJ. CRRT started.   mild liver injury with slight transaminitis, and continued encephalopathy. Cefepime completed.  6/22 amiodarone had to be restarted for af w/ RVR 6/23 starting Proning ->improved oxygenation and  vent mechanics. Started low dose lopressor. Feeding tube placed post-pyloric  6/26 no longer requiring proning, FiO2 40% 6/27 tolerating weaning, down to Fentanyl 60mg, off pressors and antibiotics, 6/28 placed back on fentanyl and Versed overnight, 1 out of 2 blood cultures growing staph epi, started on vancomycin and low-dose neo-  Interim History / Subjective:  Weaning sedation this morning, able to tolerate pressure support 5/5 No urine output Back on Neo-Synephrine ordered overnight, suspect hypotension related to sedation  Objective:  Blood pressure (!) 110/38, pulse 78, temperature (!) 97.3 F (36.3 C), temperature source Axillary, resp. rate 16, height _0  (1.575 m), weight 75.2 kg, SpO2 92 %. CVP:  [6 mmHg] 6 mmHg  Vent Mode: CPAP;PCV FiO2 (%):  [30 %-99 %] 30 % Set Rate:  [35 bmp] 35 bmp Vt Set:  [300 mL-350 mL] 300 mL PEEP:  [5 cmH20-8 cmH20] 5 cmH20 Pressure Support:  [8 cmH20] 8 cmH20 Plateau Pressure:  [22 cmH20-28 cmH20] 22 cmH20   Intake/Output Summary (Last 24 hours) at 05/01/2021 0849 Last data filed at 05/01/2021 0800 Gross per 24 hour  Intake 2427.36 ml  Output 714.2 ml  Net 1713.16 ml    Filed Weights   04/28/21 0439 04/30/21 0500 05/01/21 0500  Weight: 73.8 kg 74.1 kg 75.2 kg    General: Critically ill-appearing female, intubated, no acute distress HEENT: MM pink/moist, peri-orbital ecchymosis, moderate thick secretions suctioned, pupils equal Neuro: More awake today, opening eyes to voice and turning her head, though not clearly focusing and not following commands CV: s1s2 tachycardic irregular, no m/r/g PULM: Slightly reduced air entry  bilateral bases, no rhonchi or wheezing, adequate oxygen saturations and tidal volume on pressure support 5/5 GI: soft, bsx4 active  Extremities: warm/dry,  Firm hematoma of the R groin size not increased compared to yesterday  skin: no rashes or lesions    Labs/imaging that I have personally reviewed:  CBC,  BMP Leukocytosis stable  Resolved Hospital Problem List:   Septic shock-Pressors stopped 6/19 Klebsiella UTI, POA  Assessment & Plan:    Acute Hypoxic Respiratory Failure Secondary to CAP and ARDS Intubated on 6/17 S/p proning Now supinated and able to tolerate PSV CXR with slightly improved aeration Completed course of  Cefepime/Ceftriaxone on 6/21 P: -SBT/SAT today, hopeful for trial of extubation, if unable 18 alert enough mental status may need to start discussions for tracheostomy -DC fentanyl and Versed, Precedex as needed --Maintain full vent support with SAT/SBT as tolerated -titrate Vent setting to maintain SpO2 greater than or equal to 90%. -HOB elevated 30 degrees. -Target driving pressure less than 15 cm of water -Plateau pressures less than 30 cm H20.  -Follow chest x-ray, ABG prn.   -Bronchial hygiene and RT/bronchodilator protocol.       Acute metabolic encephalopathy History of giant cell arteritis on chronic steroids Prednisone held 6/17, had been on high dose for several months  Likely secondary to prolonged sedation P: -decrease sedation for SAT, trial Precedex -Steroids held on admission, but not hypotensive or hypoglycemic so will continue to hold -Had a fall PTA on anti-coagulation, head CT negative -Ammonia level normal     Atrial fibrillation on amiodarone, HFpEF New onset right ventricular failure on admission Preserved EF on echo 6/17 P: -continue amiodarone and po Lopressor -on heparin for anti-coagulation, holding Apixaban for renal failure     Transaminitis Likely secondary to shock liver, downtrending    Acute kidney injury-present on admission Oliguric with renal failure Nephrology following, monitoring for signs of renal recovery P: -continue CRRT, initiated 6/21 -Nephrology will keep positive today, concern for some volume depletion after CRRT for extended amount of time -maintain renal perfusion and monitor urine  output -monitor electrolytes   Hypoglycemia -Continue SSI   Anemia -Continue to trend CBC -Transfuse per protocol as needed    RLE Hematoma of the proximal thigh Developed after A-line removal P: -No active bleeding on ultrasound -Continue to monitor   Fevers Repeat blood cultures growing 1 out of 2 staph epi White blood cell count stable at 16 K P: -Suspect contaminant, vancomycin started overnight in the setting of hypotension -Await results of second blood culture, likely de-escalate if negative  Best Practice   Diet/type: tubefeeds Pain/Anxiety/Delirium protocol RASS goal: -1 to -2 VAP protocol (if indicated): Yes DVT prophylaxis: systemic heparin GI prophylaxis: PPI Glucose control:  SSI and Basal coverage  Central venous access:  Yes, and it is still needed   Arterial line:  N/A Foley:  Yes, and it is no longer needed- Mobility:  bed rest  PT consulted: N/A Antibiotics:vanc Antibiotic de-escalation: likely after second BC results Stop date: N/A Code Status:  DNR Last date of multidisciplinary goals of care discussion:  Pt's daughter states that family has discussed ongoing iHD and would like to pursue if patient does not experience any renal recovery ccm prognosis: Life-threating Disposition: remains critically ill, will stay in intensive care     CRITICAL CARE Performed by: Otilio Carpen Banner Huckaba   Total critical care time: 35 minutes  Critical care time was exclusive of separately billable procedures and treating other patients.  Critical  care was necessary to treat or prevent imminent or life-threatening deterioration.  Critical care was time spent personally by me on the following activities: development of treatment plan with patient and/or surrogate as well as nursing, discussions with consultants, evaluation of patient's response to treatment, examination of patient, obtaining history from patient or surrogate, ordering and performing treatments and  interventions, ordering and review of laboratory studies, ordering and review of radiographic studies, pulse oximetry and re-evaluation of patient's condition.   Otilio Carpen Huan Pollok, PA-C [facility] See Amion for pager If no response to pager , please call [phone] until 7pm After 7:00 pm call Elink  [phone]

## 2021-05-01 NOTE — Progress Notes (Signed)
RN requested to concentrate phenylephrine due to high rate of infusion. Patient has central line. Changed from single strength (10mg /269ml) to 100mg /260ml strength. RN aware to adjust Alaris pump settings when new bag hung.   Lindell Spar, PharmD, BCPS 05/01/2021 7:38 AM

## 2021-05-01 NOTE — Progress Notes (Signed)
[place] for Heparin Indication: atrial fibrillation  Allergies  Allergen Reactions   Crestor [Rosuvastatin Calcium] Other (See Comments)    Shoulder pain   Crestor [Rosuvastatin]     Other reaction(s): Unknown   Lipitor [Atorvastatin Calcium] Other (See Comments)    Myalgias    Pravachol [Pravastatin] Other (See Comments)    myalgias   Penicillins Itching    Has patient had a PCN reaction causing immediate rash, facial/tongue/throat swelling, SOB or lightheadedness with hypotension: No Has patient had a PCN reaction causing severe rash involving mucus membranes or skin necrosis: No Has patient had a PCN reaction that required hospitalization No Has patient had a PCN reaction occurring within the last 10 years: No If all of the above answers are "NO", then may proceed with Cephalosporin use.     Patient Measurements: Height: 5\' 2"  (157.5 cm) Weight: 75.2 kg (165 lb 12.6 oz) IBW/kg (Calculated) : 50.1 Heparin Dosing Weight: 67 kg  Vital Signs: Temp: 97.3 F (36.3 C) (06/28 0800) Temp Source: Axillary (06/28 0800) BP: 110/38 (06/28 0700) Pulse Rate: 78 (06/28 0700)  Labs: Recent Labs    04/29/21 0330 04/29/21 0737 04/29/21 1600 04/30/21 0345 04/30/21 1600 05/01/21 0400  HGB 9.3*  --   --  9.5*  --  8.6*  HCT 30.3*  --   --  30.8*  --  27.7*  PLT 142*  --   --  150  --  147*  APTT 73*  --   --   --   --   --   HEPARINUNFRC  --  0.66  --  0.51  --  0.39  CREATININE  --  0.82   < > 0.89 0.78 0.54   < > = values in this interval not displayed.    Estimated Creatinine Clearance: 53.2 mL/min (by C-G formula based on SCr of 0.54 mg/dL).  Assessment: 32 yoF admit 6/16 with ShOB, DOE, recurrent falls, BLE edema. PMH includes Afib on apixaban.  Due to worsening renal function, she was changed to heparin per pharmacy on 6/19.  She started CRRT on 6/21. Recent DOAC and AKI will falsely elevate Heparin levels; monitor and titrate  heparin dose using aPTT until levels correlate.  Today, 05/01/2021:  Heparin level 0.39 is therapeutic on heparin 1050 units/hrs Hgb low 8.5, Plts low 147 SCr WNL 6/27 RN reports R groin with significant bruising where previous A line was. RN marked the site & monitoring hematoma size.  6/27 VAS [place] of groin pseudoaneurysm: Hematoma with arterial branching seen in area adjacent to No CRRT filter clotting or complications noted.   Goal of Therapy:  Heparin level 0.3-0.7 units/ml aPTT 66-102 seconds Monitor platelets by anticoagulation protocol: Yes   Plan:  Continue heparin infusion at 1050 units/hr Daily HL and CBC Monitor R groin hematoma  Eudelia Bunch, Pharm.D 05/01/2021 10:08 AM

## 2021-05-01 NOTE — Progress Notes (Signed)
[facility] Progress Note Patient Name: Leslie Bryant DOB: [birth year] MRN: [id number]   Date of Service  05/01/2021  HPI/Events of Note  Ventilator asynchrony - Patient currently on a Fentanyl IV infusion at 250 mcg/hour.  eICU Interventions  Plan: Add Versed IV infusion. Titrate to RASS = 0 to -1.     Intervention Category Major Interventions: Respiratory failure - evaluation and management  Railey Glad Cornelia Copa 05/01/2021, 3:46 AM

## 2021-05-01 NOTE — Progress Notes (Signed)
[facility] Progress Note Patient Name: Leslie Bryant DOB: 01-Oct-1941 MRN: [id number]   Date of Service  05/01/2021  HPI/Events of Note  Patient remains asynchronous with ventilator. Unable to titrate Precedex IV infusion up d/t bradycardia.  eICU Interventions  Plan: Fentanyl IV infusion. Titrate to RASS = 0 to -1. D/C Precedex IV infusion.     Intervention Category Major Interventions: Respiratory failure - evaluation and management;Other:  Lysle Dingwall 05/01/2021, 12:46 AM

## 2021-05-01 NOTE — Progress Notes (Addendum)
Nutrition Follow-up  DOCUMENTATION CODES:   Obesity unspecified  INTERVENTION:  - will adjust TF regimen: Vital 1.5 @ 35 ml/hr with 90 ml Prosource TF QID. - this regimen will provide 1580 kcal (97% kcal need), 145 grams protein, and 642 ml free water. - change 15 ml multivitamin to 1 tablet rena-vit/day per small bore NGT.  - free water flush, if desired, to be per CCM.    NUTRITION DIAGNOSIS:   Inadequate oral intake related to inability to eat as evidenced by NPO status. -ongoing  GOAL:   Provide needs based on ASPEN/SCCM guidelines -to be met with TF regimen  MONITOR:   Vent status, TF tolerance, Labs, Weight trends, Skin  ASSESSMENT:   80 y/o female with h/o HTN, DM, Afib, HLD, GERD, IBS, breast cancer s/p mastectomy, giant cell arthritis, diverticulitis and hiatal hernia who is admitted with CAP, CHF, AKI and septic shock  Significant Events: 6/16- admission 6/17- intubation 6/18- initial RD assessment (remote); bronch 6/20- first in person RD assessment 6/21- HD cath placement; CRRT initiation 6/23- trial of prone position x16 hours/day; changed from Vital High Protein to Vital 1.5 6/24- flipped back to supine early AM and then back to prone position at 1255 6/25- flipped back to supine early AM   Patient remains intubated with small bore NGT in L nare (post-pyloric as of 6/23 abdominal x-ray). She is receiving Vital 1.5 @ 35 ml/hr with 45 ml Prosource TF QID and no free water flush. This regimen is providing 1420 kcal, 101 grams protein, and 642 ml free water.   Weight has been fluctuating throughout admission. Mild pitting edema to BUE documented in the edema flow sheet yesterday at 2000. Noted to be +2.2 L this admission.   She remains on CRRT with plan to continue this modality at this time.   Patient discussed in rounds today and has been weaning well; monitor for extubation.  Able to talk with RN about abdominal wound newly documented on 6/25. RN unsure of  extent of wound and plans to look at it and obtain more details when giving patient a bath later this shift.      Patient is currently intubated on ventilator support MV: 13.3 L/min Temp (24hrs), Avg:98.1 F (36.7 C), Min:96.4 F (35.8 C), Max:100.3 F (37.9 C) Propofol: none BP: 100/43 and MAP: 62  Labs reviewed; CBGs: 153 and 104 mg/dl, BUN: 30 mg/dl, Ca: 7.8 mg/dl, Mg: 2.8 mg/dl, LFTs elevated but down from 6/25. Medications reviewed; sliding scale novolog, 7 units novolog every 4 hours, 10 units lantus/day, 15 ml liquid multivitamin/day, 17 g miralax/day, 10 mg deltasone BID. Drips; neo @ 40 mcg/min, hepatin @ 1050 units/hr.   Diet Order:   Diet Order             Diet NPO time specified  Diet effective now                   EDUCATION NEEDS:   No education needs have been identified at this time  Skin:  Skin Assessment: Skin Integrity Issues: Skin Integrity Issues:: DTI, Other (Comment) DTI: R heel (6/24) Other: non-pressure partial thickness injury to abdomen (6/25)  Last BM:  6/24  Height:   Ht Readings from Last 1 Encounters:  04/30/21 $RemoveB'5\' 2"'[id number]$  (1.575 m)    Weight:   Wt Readings from Last 1 Encounters:  05/01/21 75.2 kg     Estimated Nutritional Needs:  Kcal:  1650 kcal Protein:  >/= 145 grams Fluid:  >/=  1.5 L/day     Jarome Matin, MS, RD, LDN, CNSC Inpatient Clinical Dietitian RD pager # available in AMION  After hours/weekend pager # available in [facility]

## 2021-05-01 NOTE — Progress Notes (Signed)
PHARMACY - PHYSICIAN COMMUNICATION CRITICAL VALUE ALERT - BLOOD CULTURE IDENTIFICATION (BCID)  Leslie Bryant is an 80 y.o. female who presented to [facility] on 04/28/2021 with a chief complaint of SOB  Assessment: 1/2 GPC cluster, staph epi, MECA+  Name of physician (or Provider) Contacted: Oletta Darter  Current antibiotics: none  Changes to prescribed antibiotics recommended:  Could be contaminant but pt hemodynamically unstable, start vanc per CRRT dosing Vancomycin 1500mg  IV x 1 then 1000mg  q24h  Results for orders placed or performed during the hospital encounter of 04/30/2021  Blood Culture ID Panel (Reflexed) (Collected: 04/29/2021 11:43 AM)  Result Value Ref Range   Enterococcus faecalis NOT DETECTED NOT DETECTED   Enterococcus Faecium NOT DETECTED NOT DETECTED   Listeria monocytogenes NOT DETECTED NOT DETECTED   Staphylococcus species DETECTED (A) NOT DETECTED   Staphylococcus aureus (BCID) NOT DETECTED NOT DETECTED   Staphylococcus epidermidis DETECTED (A) NOT DETECTED   Staphylococcus lugdunensis NOT DETECTED NOT DETECTED   Streptococcus species NOT DETECTED NOT DETECTED   Streptococcus agalactiae NOT DETECTED NOT DETECTED   Streptococcus pneumoniae NOT DETECTED NOT DETECTED   Streptococcus pyogenes NOT DETECTED NOT DETECTED   A.calcoaceticus-baumannii NOT DETECTED NOT DETECTED   Bacteroides fragilis NOT DETECTED NOT DETECTED   Enterobacterales NOT DETECTED NOT DETECTED   Enterobacter cloacae complex NOT DETECTED NOT DETECTED   Escherichia coli NOT DETECTED NOT DETECTED   Klebsiella aerogenes NOT DETECTED NOT DETECTED   Klebsiella oxytoca NOT DETECTED NOT DETECTED   Klebsiella pneumoniae NOT DETECTED NOT DETECTED   Proteus species NOT DETECTED NOT DETECTED   Salmonella species NOT DETECTED NOT DETECTED   Serratia marcescens NOT DETECTED NOT DETECTED   Haemophilus influenzae NOT DETECTED NOT DETECTED   Neisseria meningitidis NOT DETECTED NOT DETECTED   Pseudomonas  aeruginosa NOT DETECTED NOT DETECTED   Stenotrophomonas maltophilia NOT DETECTED NOT DETECTED   Candida albicans NOT DETECTED NOT DETECTED   Candida auris NOT DETECTED NOT DETECTED   Candida glabrata NOT DETECTED NOT DETECTED   Candida krusei NOT DETECTED NOT DETECTED   Candida parapsilosis NOT DETECTED NOT DETECTED   Candida tropicalis NOT DETECTED NOT DETECTED   Cryptococcus neoformans/gattii NOT DETECTED NOT DETECTED   Methicillin resistance mecA/C DETECTED (A) NOT DETECTED   Dolly Rias RPh 05/01/2021, 12:25 AM

## 2021-05-02 DIAGNOSIS — D649 Anemia, unspecified: Secondary | ICD-10-CM

## 2021-05-02 LAB — CBC
HCT: 25.8 % — ABNORMAL LOW (ref 36.0–46.0)
Hemoglobin: 7.8 g/dL — ABNORMAL LOW (ref 12.0–15.0)
MCH: 31.2 pg (ref 26.0–34.0)
MCHC: 30.2 g/dL (ref 30.0–36.0)
MCV: 103.2 fL — ABNORMAL HIGH (ref 80.0–100.0)
Platelets: 189 10*3/uL (ref 150–400)
RBC: 2.5 MIL/uL — ABNORMAL LOW (ref 3.87–5.11)
RDW: 16.7 % — ABNORMAL HIGH (ref 11.5–15.5)
WBC: 14.2 10*3/uL — ABNORMAL HIGH (ref 4.0–10.5)
nRBC: 11.4 % — ABNORMAL HIGH (ref 0.0–0.2)

## 2021-05-02 LAB — RENAL FUNCTION PANEL
Albumin: 1.8 g/dL — ABNORMAL LOW (ref 3.5–5.0)
Albumin: 1.8 g/dL — ABNORMAL LOW (ref 3.5–5.0)
Anion gap: 7 (ref 5–15)
Anion gap: 5 (ref 5–15)
BUN: 33 mg/dL — ABNORMAL HIGH (ref 8–23)
BUN: 33 mg/dL — ABNORMAL HIGH (ref 8–23)
CO2: 27 mmol/L (ref 22–32)
CO2: 27 mmol/L (ref 22–32)
Calcium: 7.9 mg/dL — ABNORMAL LOW (ref 8.9–10.3)
Calcium: 7.8 mg/dL — ABNORMAL LOW (ref 8.9–10.3)
Chloride: 104 mmol/L (ref 98–111)
Chloride: 104 mmol/L (ref 98–111)
Creatinine, Ser: 0.68 mg/dL (ref 0.44–1.00)
Creatinine, Ser: 0.78 mg/dL (ref 0.44–1.00)
GFR, Estimated: >60 mL/min (ref >60)
GFR, Estimated: >60 mL/min (ref >60)
Glucose, Bld: 163 mg/dL — ABNORMAL HIGH (ref 70–99)
Glucose, Bld: 158 mg/dL — ABNORMAL HIGH (ref 70–99)
Phosphorus: 2.6 mg/dL (ref 2.5–4.6)
Phosphorus: 2.2 mg/dL — ABNORMAL LOW (ref 2.5–4.6)
Potassium: 3.9 mmol/L (ref 3.5–5.1)
Potassium: 3.7 mmol/L (ref 3.5–5.1)
Sodium: 138 mmol/L (ref 135–145)
Sodium: 136 mmol/L (ref 135–145)

## 2021-05-02 LAB — HEPARIN LEVEL (UNFRACTIONATED): Heparin Unfractionated: 0.32 IU/mL (ref 0.30–0.70)

## 2021-05-02 LAB — GLUCOSE, CAPILLARY
Glucose-Capillary: 141 mg/dL — ABNORMAL HIGH (ref 70–99)
Glucose-Capillary: 155 mg/dL — ABNORMAL HIGH (ref 70–99)
Glucose-Capillary: 156 mg/dL — ABNORMAL HIGH (ref 70–99)
Glucose-Capillary: 160 mg/dL — ABNORMAL HIGH (ref 70–99)
Glucose-Capillary: 188 mg/dL — ABNORMAL HIGH (ref 70–99)
Glucose-Capillary: 231 mg/dL — ABNORMAL HIGH (ref 70–99)

## 2021-05-02 LAB — CULTURE, BLOOD (ROUTINE X 2)
Culture: NO GROWTH
Culture: NO GROWTH
Special Requests: ADEQUATE

## 2021-05-02 LAB — MAGNESIUM: Magnesium: 2.6 mg/dL — ABNORMAL HIGH (ref 1.7–2.4)

## 2021-05-02 MED ORDER — METOPROLOL TARTRATE 5 MG/5ML IV SOLN
5.0000 mg | INTRAVENOUS | Status: DC | PRN
  Administered 2021-05-02 – 2021-05-03 (×3): 5 mg via INTRAVENOUS
  Filled 2021-05-02 (×4): qty 5

## 2021-05-02 MED ORDER — FENTANYL CITRATE (PF) 100 MCG/2ML IJ SOLN
12.5000 ug | INTRAMUSCULAR | Status: DC | PRN
  Administered 2021-05-02 – 2021-05-04 (×6): 50 ug via INTRAVENOUS
  Filled 2021-05-02 (×6): qty 2

## 2021-05-02 MED ORDER — METOPROLOL TARTRATE 5 MG/5ML IV SOLN
5.0000 mg | Freq: Four times a day (QID) | INTRAVENOUS | Status: DC | PRN
  Administered 2021-05-02: 5 mg via INTRAVENOUS
  Filled 2021-05-02: qty 5

## 2021-05-02 MED ORDER — DEXMEDETOMIDINE HCL IN NACL 400 MCG/100ML IV SOLN
0.4000 ug/kg/h | INTRAVENOUS | Status: DC
  Administered 2021-05-02: 1.2 ug/kg/h via INTRAVENOUS
  Filled 2021-05-02: qty 100

## 2021-05-02 MED ORDER — SODIUM CHLORIDE 0.9 % IV SOLN
2.0000 g | Freq: Two times a day (BID) | INTRAVENOUS | Status: DC
  Administered 2021-05-02 – 2021-05-03 (×3): 2 g via INTRAVENOUS
  Filled 2021-05-02 (×3): qty 2

## 2021-05-02 NOTE — Progress Notes (Addendum)
[facility] Progress Note Patient Name: Leslie Bryant DOB: 10/28/1941 MRN: [id number]   Date of Service  05/02/2021  HPI/Events of Note  Patient with intermittent agitation associated with ventilator dyssynchrony.  eICU Interventions  PRN Fentanyl order changed to 12.5-50 mcg.        Kerry Kass Annjeanette Sarwar 05/02/2021, 2:25 AM

## 2021-05-02 NOTE — Progress Notes (Signed)
NAME:  Leslie Bryant MRN:  [id number] DOB:  April 18, 1941 LOS: 12 ADMISSION DATE:  04/18/2021 CONSULTATION DATE: 04/20/2021 REFERRING MD:  Alfredia Ferguson - TRH CHIEF COMPLAINT:  SOB, hypoxic respiratory failure   Brief Narrative   80 year old female presented with presented to [facility] 6/16 via EMS for worsening SOB, hypoxia and concern for PNA.On ED evaluation patient was hypoxic on RA and in A-fib RVR .CXR demonsrated bilateral lower lobe infiltrates. Labs were notable for Na 123, BNP 467, Trop 20 and WBC 15K. Patient was admitted to [facility].  PCCM consulted 6/17 for ongoing hypoxemic respiratory failure despite supplemental O2.  Pertinent Medical History:  A-fib Right breast cancer  Bowel perforation  HLD  Significant Hospital Events:  6/16 - Admitted to [facility] for SOB, hypoxia, multifocal PNA 6/17 - PCCM consulted for persistent hypoxia, mild lethargy, ICU admission -> intubated later in day. CT chest with bilateral diffuse GGO.  Failed PICC line insertion 6/18 AM - worsening AKI - creat 1.8. on vent 80% fio2, supnine. Sedated with fent gtt, versed gtt. On pressors neo. PCT high but culture negative so far. HS trop in 20s. RASS -4. On protoni and coffee ground mild returns noted this morning 6/18 PM - -in renal failure. Worsening creat. Dropping Ur op despite fluids. Bronch BAL -> 86% polys.  Alv  hge ruled out.  Elliquis now lower dose. Code changed to DNR + full medical care 6/19 renal consulted.  6/20 Remains sedated on vent minimally responsive on fentanyl drip with worsening renal failure, now off pressors 6/21 progressive multi - organ dysfunction including worsening renal failure, HD cath placed Right IJ. CRRT started.   mild liver injury with slight transaminitis, and continued encephalopathy. Cefepime completed.  6/22 amiodarone had to be restarted for af w/ RVR 6/23 starting Proning ->improved oxygenation and vent mechanics. Started low dose lopressor. Feeding tube placed post-pyloric  6/26 no longer  requiring proning, FiO2 40% 6/27 tolerating weaning, down to Fentanyl 71mg, off pressors and antibiotics, 6/28 placed back on fentanyl and Versed overnight, 1 out of 2 blood cultures growing staph epi, started on vancomycin and low-dose neo 6/29 continued issues with agitation and vent dyssynchrony overnight requiring as needed fentanyl  Interim History / Subjective:  Tmax 100.7 overnight  Currently tolerating pressure support  Objective:  Blood pressure (!) 105/48, pulse 71, temperature 98.9 F (37.2 C), temperature source Axillary, resp. rate (!) 30, height _0  (1.575 m), weight 74.3 kg, SpO2 96 %. CVP:  [2 mmHg-8 mmHg] 8 mmHg  Vent Mode: PRVC FiO2 (%):  [30 %-35 %] 35 % Set Rate:  [35 bmp] 35 bmp Vt Set:  [300 mL] 300 mL PEEP:  [5 cmH20] 5 cmH20 Pressure Support:  [8 cmH20] 8 cmH20 Plateau Pressure:  [21 cmH20-22 cmH20] 21 cmH20   Intake/Output Summary (Last 24 hours) at 05/02/2021 0817 Last data filed at 05/02/2021 0800 Gross per 24 hour  Intake 3077.31 ml  Output 642 ml  Net 2435.31 ml    Filed Weights   04/30/21 0500 05/01/21 0500 05/02/21 0400  Weight: 74.1 kg 75.2 kg 74.3 kg   Physical exam General: Chronically ill appearing deconditioning elderly female on mechanical ventilation, in NAD HEENT: ETT, MM pink/moist, PERRL,  Neuro: Sedated on vent, seen spontaneously movement head,  CV: s1s2 regular rate and rhythm, no murmur, rubs, or gallops,  PULM: Clear to auscultation bilaterally, tolerating CPAP trial, no increased work of breathing GI: soft, bowel sounds active in all 4 quadrants, non-tender, non-distended, tolerating TF Extremities:  warm/dry, no edema, large deep purple right groin hematoma  Skin: no rashes or lesions  Labs/imaging that I have personally reviewed:  CBC, BMP Leukocytosis stable  Resolved Hospital Problem List:   Septic shock-Pressors stopped 6/19 Klebsiella UTI, POA  Assessment & Plan:  Acute Hypoxic Respiratory Failure Secondary to  CAP and ARDS -Intubated on 6/17, required intermittent pronation with improvement in FiO2 seen. Now supinated and able to tolerate PSV -CXR with slightly improved aeration -Completed course of  Cefepime/Ceftriaxone on 6/21 Concern for evolving pneumonia -Patient began developing low-grade fever 6/25, repeat blood cultures and respiratory culture obtained 6/28.  Respiratory culture with few gram-negative  P: Continue vancomycin and cefepime Continue SBT as tolerated  Mentation remains barrier to extubaiton  Continue ventilator support with lung protective strategies  Wean PEEP and FiO2 for sats greater than 90%. Head of bed elevated 30 degrees. Plateau pressures less than 30 cm H20.  Follow intermittent chest x-ray and ABG.   Ensure adequate pulmonary hygiene  Follow cultures  VAP bundle in place  PAD protocol Minimize sedation   Acute metabolic encephalopathy History of giant cell arteritis on chronic steroids -Prednisone held 6/17, had been on high dose for several months  -Likely secondary to prolonged sedation P: Consider obtaining repeat head CT  Stop Precedex Minimize sedation Delirium precautions  Atrial fibrillation on amiodarone, HFpEF -New onset right ventricular failure on admission -Preserved EF on echo 6/17 P: Continue amiodarone Change p.o. Lopressor to as needed given mild hypotension Remains on heparin drip Home apixaban remains on hold  Transaminitis - improving -Likely secondary to shock liver, downtrending P: Intermittently trend LFTs Avoid hepatotoxins  Acute kidney injury-present on admission Oliguric with renal failure -Nephrology following, monitoring for signs of renal recovery P: Nephrology following, appreciate assistance Remains oliguric CRRT per nephrology Strict intake and output Avoiding nephrotoxins Electrolytes  Hypoglycemia P: CBGs every 4 hours Continue SSI Continue tube feed coverage and Lantus  Anemia P: Trend  CBC Transfuse for hemoglobin greater than 7  RLE Hematoma of the proximal thigh -Developed after A-line removal P: No signs of active bleeding Hematoma remains firm to touch but has not expanded beyond borders  Best Practice   Diet/type: tubefeeds Pain/Anxiety/Delirium protocol RASS goal: -1 to -2 VAP protocol (if indicated): Yes DVT prophylaxis: systemic heparin GI prophylaxis: PPI Glucose control:  SSI and Basal coverage  Central venous access:  Yes, and it is still needed   Arterial line:  N/A Foley:  N/A- Mobility:  bed rest  PT consulted: N/A Antibiotics:cefepime and vanc Antibiotic de-escalation: Escalated to include cefepime Stop date: N/A Code Status:  DNR Last date of multidisciplinary goals of care discussion:  6/29 daughter states at this time family would like to discuss further options but likely not pursue trach or PEG.  Family is requesting head CT to further evaluate any neurological deficits ccm prognosis: Life-threating Disposition: remains critically ill, will stay in intensive care  CRITICAL CARE Performed by: Bunnie Rehberg D. Harris  Total critical care time: 38 minutes  Critical care time was exclusive of separately billable procedures and treating other patients.  Critical care was necessary to treat or prevent imminent or life-threatening deterioration.  Critical care was time spent personally by me on the following activities: development of treatment plan with patient and/or surrogate as well as nursing, discussions with consultants, evaluation of patient's response to treatment, examination of patient, obtaining history from patient or surrogate, ordering and performing treatments and interventions, ordering and review of laboratory studies, ordering and  review of radiographic studies, pulse oximetry and re-evaluation of patient's condition.  Buena Boehm D. Kenton Kingfisher, NP-C [facility] Personal contact information can be found on Amion   05/02/2021, 8:22 AM

## 2021-05-02 NOTE — TOC Progression Note (Signed)
Transition of Care [facility]) - Progression Note    Patient Details  Name: Leslie Bryant MRN: [id number] Date of Birth: [birth year]  Transition of Care [facility]) CM/SW Contact  Leeroy Cha, RN Phone Number: 05/02/2021, 8:10 AM  Clinical Narrative:    Assessment and plan: Acute hypoxic vent dependent respiratory failure due to ARDS tube community-acquired pneumonia - Completed course of antibiotics on 6/21 - Continue vent weaning.  Daily SAT and SBT as tolerated.  Is done well, but mental status currently precludes extubation.  Has done well today off all sedation.  Can use Precedex as needed.  Overnight she had some bradycardia, but would rather use as needed sedation then continuous infusions as these are delaying her extubation.  -Low tidal volume ventilation, 4-8 cc/kg ideal body weight.  Meeting plateau and driving pressure goals. - VAP prevention protocol.     AKI due to ATN from sepsis - Continue renal replacement therapy.  Appreciate nephrology assistance - Renally dose meds and avoid nephrotoxic meds  -Strict I's/O     Hyperglycemia - Continue sliding scale insulin - Goal BG 140-180  -Increasing long-acting insulin to 15 units daily     A. fib with RVR due to acute illness - Continue scheduled metoprolol - Continue telemetry monitoring - Precedex is an ideal agent to help address this.  Unfortunately all sedation gives of the potential of hypotension, complicating rate control if she is still on the ventilator. - Anticoagulation with heparin  Chronic steroids - Continue prednisone 20 mg daily   Acute metabolic encephalopathy due to sedating medications - Avoiding sedating medications as much as possible.  As needed's would be preferred to infusions.   TOC PLAN OF CARE: Remains on the vent, sedated, following for progression and toc needs. Expected Discharge Plan: Home/Self Care Barriers to Discharge: Continued Medical Work up  Expected Discharge Plan and  Services Expected Discharge Plan: Home/Self Care   Discharge Planning Services: CM Consult   Living arrangements for the past 2 months: Single Family Home                                       Social Determinants of Health (SDOH) Interventions    Readmission Risk Interventions No flowsheet data found.

## 2021-05-02 NOTE — Progress Notes (Signed)
[place] for Heparin Indication: atrial fibrillation  Allergies  Allergen Reactions   Crestor [Rosuvastatin Calcium] Other (See Comments)    Shoulder pain   Crestor [Rosuvastatin]     Other reaction(s): Unknown   Lipitor [Atorvastatin Calcium] Other (See Comments)    Myalgias    Pravachol [Pravastatin] Other (See Comments)    myalgias   Penicillins Itching    Has patient had a PCN reaction causing immediate rash, facial/tongue/throat swelling, SOB or lightheadedness with hypotension: No Has patient had a PCN reaction causing severe rash involving mucus membranes or skin necrosis: No Has patient had a PCN reaction that required hospitalization No Has patient had a PCN reaction occurring within the last 10 years: No If all of the above answers are "NO", then may proceed with Cephalosporin use.     Patient Measurements: Height: 5\' 2"  (157.5 cm) Weight: 74.3 kg (163 lb 12.8 oz) IBW/kg (Calculated) : 50.1 Heparin Dosing Weight: 67 kg  Vital Signs: Temp: 98.9 F (37.2 C) (06/29 0800) Temp Source: Axillary (06/29 0800) BP: 112/36 (06/29 0800) Pulse Rate: 77 (06/29 0800)  Labs: Recent Labs    04/30/21 0345 04/30/21 1600 05/01/21 0400 05/01/21 1707 05/02/21 0442  HGB 9.5*  --  8.6*  --  7.8*  HCT 30.8*  --  27.7*  --  25.8*  PLT 150  --  147*  --  189  HEPARINUNFRC 0.51  --  0.39  --  0.32  CREATININE 0.89   < > 0.54 0.75 0.68   < > = values in this interval not displayed.    Estimated Creatinine Clearance: 52.9 mL/min (by C-G formula based on SCr of 0.68 mg/dL).  Assessment: 8 yoF admit 6/16 with ShOB, DOE, recurrent falls, BLE edema. PMH includes Afib on apixaban.  Due to worsening renal function, she was changed to heparin per pharmacy on 6/19.  She started CRRT on 6/21. Recent DOAC and AKI will falsely elevate Heparin levels; monitor and titrate heparin dose using aPTT until levels correlate.  Today, 05/02/2021:  Heparin  level 0.33 remains therapeutic on heparin 1050 units/hrs Hgb drifting down to 7.8, Plts improved 147>189 SCr WNL 6/27 RN reports R groin with significant bruising where previous A line was. RN marked the site & monitoring hematoma size.  6/27 VAS [place] of groin pseudoaneurysm: Hematoma with arterial branching seen in area adjacent to No CRRT filter clotting or complications noted.   Goal of Therapy:  Heparin level 0.3-0.7 units/ml aPTT 66-102 seconds Monitor platelets by anticoagulation protocol: Yes   Plan:  Continue heparin infusion at 1050 units/hr Daily HL and CBC Monitor R groin hematoma  Eudelia Bunch, Pharm.D 05/02/2021 9:32 AM

## 2021-05-02 NOTE — Progress Notes (Signed)
PCCM Progress Note   Given further decline in hemoglobin today with recent right groin hematoma, decision made to hold Heparin drip acutely with daily assessment in resumption timing   Hope Brandenburger D. Kenton Kingfisher, NP-C [facility] Personal contact information can be found on Amion  05/02/2021, 3:18 PM

## 2021-05-02 NOTE — Progress Notes (Signed)
Patient placed in wean mode PS/CP 35% peep 5 ps 5. Patient appears to be slightly tachypnic

## 2021-05-02 NOTE — Progress Notes (Addendum)
Admit: 04/16/2021 LOS: 13  41F nl GFR with anuric AKI on CRRT in setting of CAP/ARDS/VDRF, shock now off pressors, hx/o giant cell arteritis  Subjective:  Started CRRT 6/21 No UOP overnight I/O + 3 L yest, 910+ overnight CVP 2-7   06/28 0701 - 06/29 0700 In: 3171.6 [I.V.:928.3; NG/GT:1300; IV Piggyback:943.3] Out: 622   Filed Weights   04/30/21 0500 05/01/21 0500 05/02/21 0400  Weight: 74.1 kg 75.2 kg 74.3 kg    Scheduled Meds:  amiodarone  200 mg Per Tube Daily   chlorhexidine gluconate (MEDLINE KIT)  15 mL Mouth Rinse BID   Chlorhexidine Gluconate Cloth  6 each Topical Daily   docusate  100 mg Per Tube Daily   feeding supplement (PROSource TF)  90 mL Per Tube QID   insulin aspart  0-15 Units Subcutaneous Q4H   insulin aspart  7 Units Subcutaneous Q4H   insulin glargine  15 Units Subcutaneous Daily   ipratropium  0.5 mg Nebulization Q6H   levalbuterol  0.63 mg Nebulization Q6H   mouth rinse  15 mL Mouth Rinse 10 times per day   multivitamin  1 tablet Per NG tube QHS   pantoprazole sodium  40 mg Per Tube BID   polyethylene glycol  17 g Per Tube Daily   predniSONE  10 mg Per Tube BID WC   sodium chloride flush  10-40 mL Intracatheter Q12H   Continuous Infusions:  sodium chloride 20 mL/hr at 05/02/21 1300   sodium chloride 10 mL/hr at 04/27/21 2200   ceFEPime (MAXIPIME) IV Stopped (05/02/21 1046)   dexmedetomidine (PRECEDEX) IV infusion Stopped (05/02/21 0952)   feeding supplement (VITAL 1.5 CAL) 1,000 mL (05/01/21 1900)   heparin 1,050 Units/hr (05/02/21 1300)   phenylephrine (NEO-SYNEPHRINE) Adult infusion Stopped (05/02/21 1250)   prismasol BGK 2/2.5 replacement solution 500 mL/hr at 05/02/21 0812   prismasol BGK 2/2.5 replacement solution 500 mL/hr at 05/02/21 0617   prismasol BGK 4/2.5 1,500 mL/hr at 05/02/21 1024   vancomycin Stopped (05/02/21 0102)   PRN Meds:.Place/Maintain arterial line **AND** sodium chloride, acetaminophen, acetaminophen, fentaNYL  (SUBLIMAZE) injection, heparin, lip balm, metoprolol tartrate, sodium chloride flush  Current Labs: reviewed  6/16 UA w/o hematuria, trace protein 6/19 Renal US no HN  Physical Exam:  Blood pressure (!) 148/54, pulse (!) 156, temperature 99.2 F (37.3 C), temperature source Axillary, resp. rate (!) 44, height 5' 2"  (1.575 m), weight 74.3 kg, SpO2 94 %. Intubated sedated Tachy IRIR Coarse bs b/l 1+ LE and UE edema S/nt/nd No rashes/lesions ETT in place, NCAT  Assessment/ Plan AKI - anuric, likely ATN in setting of #2 and #3. Given prolonged anuric state, progressive azotemia, and hypervolemia started CRRT 6/21. Cont CRRT.  Volume - intravasc vol depletion w/ low CVP, low BP's and below admit wt. Keeping + 50 cc/hr for now. Has some edema related to low albumin/ 3rd spacing.  ARDS/CAP/VDRF: per CCM Hypotension - on one pressor, weaning down Giant Cell arteritis: chronic IS, now on stress dosing Anemia: transfusion per primary, for pRBC 6/22 [name] UTI: s/p course of cefepime AFib- on hep gtt     Sol Blazing  05/02/2021, 1:09 PM  Recent Labs  Lab 05/01/21 0400 05/01/21 1707 05/02/21 0442  NA 137 136 138  K 3.8 4.4 3.9  CL 103 103 104  CO2 27 26 27   GLUCOSE 153* 228* 163*  BUN 30* 33* 33*  CREATININE 0.54 0.75 0.68  CALCIUM 7.8* 7.9* 7.9*  PHOS 2.5 2.2* 2.6  Recent Labs  Lab 04/30/21 0345 05/01/21 0400 05/02/21 0442  WBC 15.9* 16.0* 14.2*  HGB 9.5* 8.6* 7.8*  HCT 30.8* 27.7* 25.8*  MCV 102.0* 102.6* 103.2*  PLT 150 147* 189

## 2021-05-02 NOTE — Progress Notes (Signed)
Pharmacy Antibiotic Note  Leslie Bryant is a 80 y.o. female admitted on 04/20/2021 with pneumonia.  Pharmacy has been consulted for vancomycin & cefepime dosing. 05/02/2021 Tm 100.7, WBC 14.2, continues on CRRT Vancomycin started 6/26 for 1/3 bottles MRSE - probable contaminant Today 6 /28 Trach asp gram stain: few GNR, rare gram positive cocci in clusters Tm 100.7, WBC 16>14.2 on prednisone  Plan: Continue vancomycin 1000 mg IV q24 Add cefepime 2 gm IV q12 F/u CRRT plans F/u WBC, temp, culture data  Height: _0  (157.5 cm) Weight: 74.3 kg (163 lb 12.8 oz) IBW/kg (Calculated) : 50.1  Temp (24hrs), Avg:99.4 F (37.4 C), Min:98.9 F (37.2 C), Max:100.7 F (38.2 C)  Recent Labs  Lab 04/28/21 0440 04/28/21 1600 04/29/21 0330 04/29/21 0737 04/30/21 0345 04/30/21 1600 05/01/21 0400 05/01/21 1707 05/02/21 0442  WBC 15.5*  --  16.7*  --  15.9*  --  16.0*  --  14.2*  CREATININE 1.08*   < >  --    < > 0.89 0.78 0.54 0.75 0.68   < > = values in this interval not displayed.    Estimated Creatinine Clearance: 52.9 mL/min (by C-G formula based on SCr of 0.68 mg/dL).    Allergies  Allergen Reactions   Crestor [Rosuvastatin Calcium] Other (See Comments)    Shoulder pain   Crestor [Rosuvastatin]     Other reaction(s): Unknown   Lipitor [Atorvastatin Calcium] Other (See Comments)    Myalgias    Pravachol [Pravastatin] Other (See Comments)    myalgias   Penicillins Itching    Has patient had a PCN reaction causing immediate rash, facial/tongue/throat swelling, SOB or lightheadedness with hypotension: No Has patient had a PCN reaction causing severe rash involving mucus membranes or skin necrosis: No Has patient had a PCN reaction that required hospitalization No Has patient had a PCN reaction occurring within the last 10 years: No If all of the above answers are "NO", then may proceed with Cephalosporin use.     Antimicrobials this admission: 6/16 Azithromycin >> 6/19 6/16  Ceftriaxone >> 6/17 6/18 Cefepime >> 6/22,  resumed 6/29>> 6/28 vanc >> Microbiology results: 6/16 MRSA PCR: neg 6/16 BCx: sent at [facility] but [facility] can't find> lost 6/16 UCx: > 100 K Kleb pneumo (pan sens except Amp) 6/17 Strep pneum Ag: neg 6/18 Trach aspirate: no organism on gram stain; Cxt rare candida albicans F 6/18 AFB BAL: neg x 1 6/18 Fungal cx/BAL: ip 6/18 PJP smear: Ag negative 6/18 M tuberculisos; neg  6/26 BCx2 L hand: ngtd 6/26 BCx2 L hand aerobic bottle only: MRSE 6/28 TA: few GNR, rare GPC in clusters  Thank you for allowing pharmacy to be a part of this patient's care.  Eudelia Bunch, Pharm.D 05/02/2021 9:54 AM

## 2021-05-03 ENCOUNTER — Inpatient Hospital Stay (HOSPITAL_COMMUNITY): Payer: Medicare Other

## 2021-05-03 LAB — RENAL FUNCTION PANEL
Albumin: 1.9 g/dL — ABNORMAL LOW (ref 3.5–5.0)
Albumin: 1.8 g/dL — ABNORMAL LOW (ref 3.5–5.0)
Anion gap: 8 (ref 5–15)
Anion gap: 9 (ref 5–15)
BUN: 65 mg/dL — ABNORMAL HIGH (ref 8–23)
BUN: 78 mg/dL — ABNORMAL HIGH (ref 8–23)
CO2: 23 mmol/L (ref 22–32)
CO2: 21 mmol/L — ABNORMAL LOW (ref 22–32)
Calcium: 8.2 mg/dL — ABNORMAL LOW (ref 8.9–10.3)
Calcium: 7.8 mg/dL — ABNORMAL LOW (ref 8.9–10.3)
Chloride: 105 mmol/L (ref 98–111)
Chloride: 107 mmol/L (ref 98–111)
Creatinine, Ser: 1.52 mg/dL — ABNORMAL HIGH (ref 0.44–1.00)
Creatinine, Ser: 1.69 mg/dL — ABNORMAL HIGH (ref 0.44–1.00)
GFR, Estimated: 34 mL/min — ABNORMAL LOW (ref >60)
GFR, Estimated: 30 mL/min — ABNORMAL LOW (ref >60)
Glucose, Bld: 207 mg/dL — ABNORMAL HIGH (ref 70–99)
Glucose, Bld: 243 mg/dL — ABNORMAL HIGH (ref 70–99)
Phosphorus: 5.5 mg/dL — ABNORMAL HIGH (ref 2.5–4.6)
Phosphorus: 5.3 mg/dL — ABNORMAL HIGH (ref 2.5–4.6)
Potassium: 4.4 mmol/L (ref 3.5–5.1)
Potassium: 4.6 mmol/L (ref 3.5–5.1)
Sodium: 136 mmol/L (ref 135–145)
Sodium: 137 mmol/L (ref 135–145)

## 2021-05-03 LAB — GLUCOSE, CAPILLARY
Glucose-Capillary: 199 mg/dL — ABNORMAL HIGH (ref 70–99)
Glucose-Capillary: 214 mg/dL — ABNORMAL HIGH (ref 70–99)
Glucose-Capillary: 207 mg/dL — ABNORMAL HIGH (ref 70–99)
Glucose-Capillary: 221 mg/dL — ABNORMAL HIGH (ref 70–99)
Glucose-Capillary: 153 mg/dL — ABNORMAL HIGH (ref 70–99)
Glucose-Capillary: 170 mg/dL — ABNORMAL HIGH (ref 70–99)

## 2021-05-03 LAB — CBC
HCT: 26 % — ABNORMAL LOW (ref 36.0–46.0)
Hemoglobin: 7.9 g/dL — ABNORMAL LOW (ref 12.0–15.0)
MCH: 31.5 pg (ref 26.0–34.0)
MCHC: 30.4 g/dL (ref 30.0–36.0)
MCV: 103.6 fL — ABNORMAL HIGH (ref 80.0–100.0)
Platelets: 211 10*3/uL (ref 150–400)
RBC: 2.51 MIL/uL — ABNORMAL LOW (ref 3.87–5.11)
RDW: 17 % — ABNORMAL HIGH (ref 11.5–15.5)
WBC: 15 10*3/uL — ABNORMAL HIGH (ref 4.0–10.5)
nRBC: 7.7 % — ABNORMAL HIGH (ref 0.0–0.2)

## 2021-05-03 LAB — CULTURE, RESPIRATORY W GRAM STAIN: Culture: NORMAL

## 2021-05-03 LAB — HEPARIN LEVEL (UNFRACTIONATED)
Heparin Unfractionated: <0.1 IU/mL — ABNORMAL LOW (ref 0.30–0.70)
Heparin Unfractionated: 0.34 IU/mL (ref 0.30–0.70)

## 2021-05-03 LAB — MAGNESIUM: Magnesium: 2.7 mg/dL — ABNORMAL HIGH (ref 1.7–2.4)

## 2021-05-03 MED ORDER — HEPARIN (PORCINE) 25000 UT/250ML-% IV SOLN
1050.0000 [IU]/h | INTRAVENOUS | Status: DC
  Administered 2021-05-03 – 2021-05-06 (×4): 1050 [IU]/h via INTRAVENOUS
  Filled 2021-05-03 (×4): qty 250

## 2021-05-03 MED ORDER — CEFTRIAXONE SODIUM 2 G IJ SOLR
2.0000 g | INTRAMUSCULAR | Status: DC
Start: 2021-05-04 — End: 2021-05-07
  Administered 2021-05-04 – 2021-05-06 (×3): 2 g via INTRAVENOUS
  Filled 2021-05-03 (×2): qty 2
  Filled 2021-05-03: qty 20
  Filled 2021-05-03: qty 2

## 2021-05-03 MED ORDER — SODIUM CHLORIDE 0.9 % IV SOLN
400.0000 [IU]/h | INTRAVENOUS | Status: DC
  Administered 2021-05-03: 400 [IU]/h via INTRAVENOUS_CENTRAL
  Filled 2021-05-03: qty 10000

## 2021-05-03 MED ORDER — HEPARIN BOLUS VIA INFUSION (CRRT)
1000.0000 [IU] | INTRAVENOUS | Status: DC | PRN
  Filled 2021-05-03: qty 1000

## 2021-05-03 MED ORDER — METOPROLOL TARTRATE 25 MG/10 ML ORAL SUSPENSION
25.0000 mg | Freq: Two times a day (BID) | ORAL | Status: DC
  Administered 2021-05-03 – 2021-05-05 (×5): 25 mg
  Filled 2021-05-03 (×5): qty 10

## 2021-05-03 MED ORDER — ALTEPLASE 2 MG IJ SOLR
2.0000 mg | Freq: Once | INTRAMUSCULAR | Status: AC
Start: 2021-05-03 — End: 2021-05-03
  Administered 2021-05-03: 1.2 mg
  Filled 2021-05-03: qty 2

## 2021-05-03 NOTE — Progress Notes (Addendum)
NAME:  Leslie Bryant MRN:  [id number] DOB:  08/02/1941 LOS: 62 ADMISSION DATE:  05/01/2021 CONSULTATION DATE: 04/20/2021 REFERRING MD:  Alfredia Ferguson - TRH CHIEF COMPLAINT:  SOB, hypoxic respiratory failure   Brief Narrative   80 year old female presented with presented to [facility] 6/16 via EMS for worsening SOB, hypoxia and concern for PNA.On ED evaluation patient was hypoxic on RA and in A-fib RVR .CXR demonsrated bilateral lower lobe infiltrates. Labs were notable for Na 123, BNP 467, Trop 20 and WBC 15K. Patient was admitted to [facility].  PCCM consulted 6/17 for ongoing hypoxemic respiratory failure despite supplemental O2.  Pertinent Medical History:  A-fib Right breast cancer  Bowel perforation  HLD  Significant Hospital Events:  6/16 - Admitted to [facility] for SOB, hypoxia, multifocal PNA 6/17 - PCCM consulted for persistent hypoxia, mild lethargy, ICU admission -> intubated later in day. CT chest with bilateral diffuse GGO.  Failed PICC line insertion 6/18 AM - worsening AKI - creat 1.8. on vent 80% fio2, supnine. Sedated with fent gtt, versed gtt. On pressors neo. PCT high but culture negative so far. HS trop in 20s. RASS -4. On protoni and coffee ground mild returns noted this morning 6/18 PM - -in renal failure. Worsening creat. Dropping Ur op despite fluids. Bronch BAL -> 86% polys.  Alv  hge ruled out.  Elliquis now lower dose. Code changed to DNR + full medical care 6/19 renal consulted.  6/20 Remains sedated on vent minimally responsive on fentanyl drip with worsening renal failure, now off pressors 6/21 progressive multi - organ dysfunction including worsening renal failure, HD cath placed Right IJ. CRRT started.   mild liver injury with slight transaminitis, and continued encephalopathy. Cefepime completed.  6/22 amiodarone had to be restarted for af w/ RVR 6/23 starting Proning ->improved oxygenation and vent mechanics. Started low dose lopressor. Feeding tube placed post-pyloric  6/26 no longer  requiring proning, FiO2 40% 6/27 tolerating weaning, down to Fentanyl 6mg, off pressors and antibiotics, 6/28 placed back on fentanyl and Versed overnight, 1 out of 2 blood cultures growing staph epi, started on vancomycin and low-dose neo 6/29 continued issues with agitation and vent dyssynchrony overnight requiring as needed fentanyl 6/30 no acute events overnight required only minimal as needed fentanyl pushes for vent compliance.  Mentation continues to improve daily, currently able to follow simple commands but very deconditioned  Interim History / Subjective:  Issues with CRRT machine clotting overnight Fever curve improved with addition of cefepime 6/29  Objective:  Blood pressure 132/75, pulse 100, temperature 99.7 F (37.6 C), temperature source Axillary, resp. rate (!) 35, height _0  (1.575 m), weight 74.6 kg, SpO2 97 %. CVP:  [2 mmHg-8 mmHg] 6 mmHg  Vent Mode: PRVC FiO2 (%):  [30 %-35 %] 30 % Set Rate:  [35 bmp] 35 bmp Vt Set:  [300 mL] 300 mL PEEP:  [5 cmH20] 5 cmH20 Pressure Support:  [5 cmH20-10 cmH20] 10 cmH20 Plateau Pressure:  [11 cmH20-23 cmH20] 11 cmH20   Intake/Output Summary (Last 24 hours) at 05/03/2021 0719 Last data filed at 05/03/2021 0503 Gross per 24 hour  Intake 2150.85 ml  Output 896.5 ml  Net 1254.35 ml    Filed Weights   05/01/21 0500 05/02/21 0400 05/03/21 0503  Weight: 75.2 kg 74.3 kg 74.6 kg   Physical exam General: Acute on chronic ill-appearing elderly female lying in bed in no acute distress HEENT: ETT, MM pink/moist, PERRL,  Neuro: Eyes spontaneously open, able to follow simple commands this  morning but severely deconditioned  CV: s1s2 regular rate and rhythm, no murmur, rubs, or gallops,  PULM: Auscultation bilaterally, no increased work of breathing, no added breath sounds GI: soft, bowel sounds active in all 4 quadrants, non-tender, non-distended, tolerating TF Extremities: warm/dry, no edema  Skin: no rashes or  lesions   Labs/imaging that I have personally reviewed:  CBC, BMP Leukocytosis stable  Resolved Hospital Problem List:   Septic shock-Pressors stopped 6/19 Klebsiella UTI, POA  Assessment & Plan:  Acute Hypoxic Respiratory Failure Secondary to CAP and ARDS -Intubated on 6/17, required intermittent pronation with improvement in FiO2 seen. Now supinated and able to tolerate PSV -CXR with slightly improved aeration -Completed course of  Cefepime/Ceftriaxone on 6/21 Concern for evolving pneumonia -Patient began developing low-grade fever 6/25, repeat blood cultures and respiratory culture obtained 6/28.  Respiratory culture with few gram-negative  P: Continues to tolerate CPAP trial well Mentation continues to improve hopefully can attempt extubation soon Decompensation due to severe deconditioning from extended critical care stay remains high potential Continue ventilator support with lung protective strategies  Wean PEEP and FiO2 for sats greater than 90%. Head of bed elevated 30 degrees. Plateau pressures less than 30 cm H20.  Follow intermittent chest x-ray and ABG.   SAT/SBT as tolerated, mentation preclude extubation  Ensure adequate pulmonary hygiene  Follow cultures  VAP bundle in place  PAD protocol  Acute metabolic encephalopathy History of giant cell arteritis on chronic steroids -Prednisone held 6/17, had been on high dose for several months  -Likely secondary to prolonged sedation P: Continues to improve with decreased sedation Able to follow simple commands this morning Delirium cautions  Atrial fibrillation on amiodarone, HFpEF -New onset right ventricular failure on admission -Preserved EF on echo 6/17 P: Continue PO amiodarone and lopressor  Continuous telemetry  Heparin drip currently on hold, home Apixaban on hold   Transaminitis - improving -Likely secondary to shock liver, downtrending P: Avoid hepatotoxins Intermittently trend LFTs  Acute  kidney injury-present on admission Oliguric with renal failure -Nephrology following, monitoring for signs of renal recovery P: Nephrology following appreciate assistance  Follow renal function  Monitor urine output Trend Bmet Avoid nephrotoxins Ensure adequate renal perfusion  Optimize electrolytes   Hypoglycemia P: CBG q4hrs  Continue SSI, tube feed coverage and Lantus   Anemia P: Trend CBC Transfuse for hgb less than 7  RLE Hematoma of the proximal thigh -Developed after A-line removal P: Supportive care  No signs of active bleeding   Best Practice   Diet/type: tubefeeds Pain/Anxiety/Delirium protocol RASS goal: -1 to -2 VAP protocol (if indicated): Yes DVT prophylaxis: systemic heparin GI prophylaxis: PPI Glucose control:  SSI and Basal coverage  Central venous access:  Yes, and it is still needed   Arterial line:  N/A Foley:  N/A- Mobility:  bed rest  PT consulted: N/A Antibiotics:cefepime and vanc Antibiotic de-escalation: Escalated to include cefepime Stop date: N/A Code Status:  DNR Last date of multidisciplinary goals of care discussion:  6/29 daughter states at this time family would like to discuss further options but likely not pursue trach or PEG.  Family is requesting head CT to further evaluate any neurological deficits ccm prognosis: Life-threating Disposition: remains critically ill, will stay in intensive care  CRITICAL CARE Performed by: Whitney D. Harris  Total critical care time: 37 minutes  Critical care time was exclusive of separately billable procedures and treating other patients.  Critical care was necessary to treat or prevent imminent or life-threatening deterioration.  Critical care was time spent personally by me on the following activities: development of treatment plan with patient and/or surrogate as well as nursing, discussions with consultants, evaluation of patient's response to treatment, examination of patient, obtaining  history from patient or surrogate, ordering and performing treatments and interventions, ordering and review of laboratory studies, ordering and review of radiographic studies, pulse oximetry and re-evaluation of patient's condition.  Whitney D. Kenton Kingfisher, NP-C [facility] Personal contact information can be found on Amion  05/03/2021, 7:19 AM    Attending attestation: Ms. Barua is an 80 y/o woman with a history of Afib and breast cancer who presented with pneumonia, developed ARDS and AKI requiring CRRT. She remains intubated, but is waking up slowly off sedation. Today she denies complaints.  BP (!) 121/56   Pulse (!) 105   Temp 98.5 F (36.9 C) (Axillary)   Resp (!) 36   Ht _0  (1.575 m)   Wt 74.6 kg   SpO2 97%   BMI 30.08 kg/m  Critically ill appearing woman lying in bed in NAD Bruising on R eye.  Breathing comfortably on 5/5, less tan sputum today, but still thick.  S1S2, irreg rhythm, mild tachycardia Abd soft, NT Significant UE edema, no cyanosis Very weak- cannot lift arms off the bed. Very weak grip squeeze bilaterally, not wiggling toes today. Barely able to lift head from pillow. Tracking, comprehension intact. Skin warm, dry, ecchymoses as above  Tracheal aspirate- normal flora WBC 15 H/H 7.9/26  Assessment & plan: Acute hypoxic vent dependent respiratory failure due to ARDS from community-acquired pneumonia. - ARDS resolved - Low tidal volume ventilation-meeting pressure goals - Continue SAT and SBT daily.  Patient is not interested in tracheostomy.  Given prolonged time on mechanical ventilation and planning for one-way extubation, she should be optimized.  Currently I worry about her ability to manage her secretions given her overall weakness.  Hopeful for extubation in the coming days. - VAP prevention protocol - Despite cultures with normal flora, high neutrophil count and large amount of sputum warrants antibiotics.  Planning for 7-day  course. - Pad protocol for sedation.  Doing well on minimal sedation, continuing to make progress daily.  AKI due to ATN from sepsis - CRRT per nephrology's recommendations - Recommend pulling fluid given significant upper extremity edema that has worsened in the last few days. - Renally dose meds, avoid nephrotoxic meds - Strict I's/O  Sepsis resolved  Acute metabolic encephalopathy-improving with sedation off - Continue to avoid sedation - Delirium precautions.  Family at bedside is of assistance.  Acute anemia-due to sepsis, blood loss with thigh hematoma, consumption from circuit.  No signs of active bleeding. - Okay to restart heparin today - Transfuse for hemoglobin less than 7 or hemodynamically significant bleeding. - Minimize blood draws were able.  Severe deconditioning - We will need aggressive PT, OT.  I have discussed with her family previously that she is unlikely to return to her previous functional status, although this was quite good.  Daughter updated at bedside during rounds.  This patient is critically ill with multiple organ system failure which requires frequent high complexity decision making, assessment, support, evaluation, and titration of therapies. This was completed through the application of advanced monitoring technologies and extensive interpretation of multiple databases. During this encounter critical care time was devoted to patient care services described in this note for 40 minutes.  Julian Hy, DO 05/03/21 1:16 PM [facility]

## 2021-05-03 NOTE — Progress Notes (Signed)
Received call. About to restart CRRT once TPA removed. Concern for worsening volume overload/edema. Possible third spacing. Had been around net pos 50cc/hr. Will aim for net neg 50 to 100cc/hr. Still on the ventilator. UF orders updated  Gean Quint, MD [facility]

## 2021-05-03 NOTE — Progress Notes (Signed)
Admit: 04/21/2021 LOS: 14  56F nl GFR with anuric AKI on CRRT in setting of CAP/ARDS/VDRF, shock now off pressors, hx/o giant cell arteritis  Subjective:  Started CRRT 6/21 No UOP overnight I/O +1.3 L yest CVP 6- 9   06/29 0701 - 06/30 0700 In: 2150.9 [I.V.:538.9; NG/GT:1236.8; IV Piggyback:375.2] Out: 896.5 [Stool:120]  Filed Weights   05/01/21 0500 05/02/21 0400 05/03/21 0503  Weight: 75.2 kg 74.3 kg 74.6 kg    Scheduled Meds:  alteplase  2 mg Intracatheter Once   amiodarone  200 mg Per Tube Daily   chlorhexidine gluconate (MEDLINE KIT)  15 mL Mouth Rinse BID   Chlorhexidine Gluconate Cloth  6 each Topical Daily   docusate  100 mg Per Tube Daily   feeding supplement (PROSource TF)  90 mL Per Tube QID   insulin aspart  0-15 Units Subcutaneous Q4H   insulin aspart  7 Units Subcutaneous Q4H   insulin glargine  15 Units Subcutaneous Daily   ipratropium  0.5 mg Nebulization Q6H   levalbuterol  0.63 mg Nebulization Q6H   mouth rinse  15 mL Mouth Rinse 10 times per day   metoprolol tartrate  25 mg Per Tube BID   multivitamin  1 tablet Per NG tube QHS   pantoprazole sodium  40 mg Per Tube BID   polyethylene glycol  17 g Per Tube Daily   predniSONE  10 mg Per Tube BID WC   sodium chloride flush  10-40 mL Intracatheter Q12H   Continuous Infusions:  sodium chloride Stopped (05/03/21 0705)   sodium chloride 10 mL/hr at 05/03/21 1200   [START ON 05/04/2021] cefTRIAXone (ROCEPHIN)  IV     feeding supplement (VITAL 1.5 CAL) 1,000 mL (05/01/21 1900)   heparin 1,050 Units/hr (05/03/21 1512)   phenylephrine (NEO-SYNEPHRINE) Adult infusion Stopped (05/02/21 1250)   prismasol BGK 2/2.5 replacement solution 500 mL/hr at 05/02/21 1816   prismasol BGK 2/2.5 replacement solution 500 mL/hr at 05/02/21 1654   prismasol BGK 4/2.5 1,500 mL/hr at 05/02/21 1654   PRN Meds:.Place/Maintain arterial line **AND** sodium chloride, acetaminophen, acetaminophen, fentaNYL (SUBLIMAZE) injection, heparin,  lip balm, metoprolol tartrate, sodium chloride flush  Current Labs: reviewed  6/16 UA w/o hematuria, trace protein 6/19 Renal US no HN  Physical Exam:  Blood pressure 116/75, pulse (!) 106, temperature 98.5 F (36.9 C), temperature source Axillary, resp. rate (!) 30, height 5' 2"  (1.575 m), weight 74.6 kg, SpO2 97 %. Intubated sedated Tachy IRIR Coarse bs b/l 1+ LE and UE edema S/nt/nd No rashes/lesions ETT in place, NCAT  Assessment/ Plan AKI - anuric, likely ATN in setting of #2 and #3. Given prolonged anuric state, progressive azotemia, and hypervolemia started CRRT 6/21. Having filter clotting issues, one of the HD catheter ports is occluded. Plan if for TPA trial to see if this will open it up. Cont CRRT if possible tonight. If the catheter is not working post TPA, pt can be kept off CRRT overnight and we can discuss next plan in the morning.  Volume - intravasc vol depletion w/ low CVP, low BP's and below admit wt so was given IVF"s and we are now keeping 50 cc/hr +.  BP's have improved and is off pressors now. Has some edema related to low albumin/ 3rd spacing.  ARDS/CAP/VDRF: per CCM, improving significantly by CXR Hypotension - resolved Giant Cell arteritis: chronic IS, now on stress dosing Anemia: transfusion per primary, for pRBC 6/22 [name] UTI: s/p course of cefepime AFib- on hep gtt  Sandy Salaam Inita Uram  05/03/2021, 3:35 PM  Recent Labs  Lab 05/02/21 0442 05/02/21 1600 05/03/21 0500  NA 138 136 136  K 3.9 3.7 4.4  CL 104 104 105  CO2 27 27 23   GLUCOSE 163* 158* 207*  BUN 33* 33* 65*  CREATININE 0.68 0.78 1.52*  CALCIUM 7.9* 7.8* 8.2*  PHOS 2.6 2.2* 5.5*    Recent Labs  Lab 05/01/21 0400 05/02/21 0442 05/03/21 0500  WBC 16.0* 14.2* 15.0*  HGB 8.6* 7.8* 7.9*  HCT 27.7* 25.8* 26.0*  MCV 102.6* 103.2* 103.6*  PLT 147* 189 211

## 2021-05-03 NOTE — Progress Notes (Signed)
[facility] Progress Note Patient Name: Leslie Bryant DOB: 1941-08-23 MRN: [id number]   Date of Service  05/03/2021  HPI/Events of Note  Patient needs KUB check to verify enteral tube position.  eICU Interventions  Enteral tube in good position in the distal stomach.        Kerry Kass Azjah Pardo 05/03/2021, 9:07 PM

## 2021-05-03 NOTE — Progress Notes (Signed)
At 19:00 the filter clotted, RN preparing to replaced the filter and called on-call Nephrology to order for anti-coagulant. Leslie Ou MD advised to wait for the result of labs before restarting CRRT. At 19:55, Leslie Ou MD ordered to hold CRRT because lab results K+ and Bicarb were good and have the morning MD re-evaluate.

## 2021-05-03 NOTE — Progress Notes (Signed)
Patient's crrt on hold. High pressure alarm due to blue lumen clotting. No blood return noted from lumen and difficulty flushing. Both red and blue lumens heparin locked. Performed dressing change.   Nephrology and CCM notified. IV team consult put in, awaiting tpa catheter therapy before starting back crrt.   Orders to restart patients heparin infusion received.   Will continue to monitor

## 2021-05-03 NOTE — Progress Notes (Signed)
[place] for Heparin Indication: atrial fibrillation  Allergies  Allergen Reactions   Crestor [Rosuvastatin Calcium] Other (See Comments)    Shoulder pain   Crestor [Rosuvastatin]     Other reaction(s): Unknown   Lipitor [Atorvastatin Calcium] Other (See Comments)    Myalgias    Pravachol [Pravastatin] Other (See Comments)    myalgias   Penicillins Itching    Has patient had a PCN reaction causing immediate rash, facial/tongue/throat swelling, SOB or lightheadedness with hypotension: No Has patient had a PCN reaction causing severe rash involving mucus membranes or skin necrosis: No Has patient had a PCN reaction that required hospitalization No Has patient had a PCN reaction occurring within the last 10 years: No If all of the above answers are "NO", then may proceed with Cephalosporin use.     Patient Measurements: Height: 5\' 2"  (157.5 cm) Weight: 74.6 kg (164 lb 7.4 oz) IBW/kg (Calculated) : 50.1 Heparin Dosing Weight: 67 kg  Vital Signs: Temp: 98.5 F (36.9 C) (06/30 1200) Temp Source: Axillary (06/30 1200) BP: 121/56 (06/30 1200) Pulse Rate: 105 (06/30 1200)  Labs: Recent Labs    05/01/21 0400 05/01/21 1707 05/02/21 0442 05/02/21 1600 05/03/21 0500  HGB 8.6*  --  7.8*  --  7.9*  HCT 27.7*  --  25.8*  --  26.0*  PLT 147*  --  189  --  211  HEPARINUNFRC 0.39  --  0.32  --  <0.10*  CREATININE 0.54   < > 0.68 0.78 1.52*   < > = values in this interval not displayed.    Estimated Creatinine Clearance: 27.9 mL/min (A) (by C-G formula based on SCr of 1.52 mg/dL (H)).  Assessment: 30 yoF admit 6/16 with ShOB, DOE, recurrent falls, BLE edema. PMH includes Afib on apixaban.  Due to worsening renal function, she was changed to heparin per pharmacy on 6/19. She started CRRT on 6/21. Heparin was held 6/29 for R groin hematoma.  Today, 05/03/2021:  OK to resume heparin per CCM as R groin hematoma stable w/o bleeding Hgb low but  stable from yesterday; Plt improved to WNL Remains on CRRT No CRRT filter clotting or complications noted  Goal of Therapy:  Heparin level 0.3-0.7 units/ml Monitor platelets by anticoagulation protocol: Yes   Plan:  Resume heparin infusion at previous rate of 1050 units/hr Check 8-hr heparin level as DOAC effects were previously shown to have dissipated Monitor R groin hematoma F/u for resolution of AKI and ability to resume [place], PharmD, BCPS [phone] 05/03/2021, 1:27 PM

## 2021-05-04 ENCOUNTER — Ambulatory Visit: Payer: Medicare Other | Admitting: Internal Medicine

## 2021-05-04 LAB — RENAL FUNCTION PANEL
Albumin: 1.8 g/dL — ABNORMAL LOW (ref 3.5–5.0)
Anion gap: 9 (ref 5–15)
BUN: 47 mg/dL — ABNORMAL HIGH (ref 8–23)
CO2: 26 mmol/L (ref 22–32)
Calcium: 7.9 mg/dL — ABNORMAL LOW (ref 8.9–10.3)
Chloride: 102 mmol/L (ref 98–111)
Creatinine, Ser: 1.08 mg/dL — ABNORMAL HIGH (ref 0.44–1.00)
GFR, Estimated: 52 mL/min — ABNORMAL LOW (ref >60)
Glucose, Bld: 146 mg/dL — ABNORMAL HIGH (ref 70–99)
Phosphorus: 3 mg/dL (ref 2.5–4.6)
Potassium: 3.9 mmol/L (ref 3.5–5.1)
Sodium: 137 mmol/L (ref 135–145)

## 2021-05-04 LAB — GLUCOSE, CAPILLARY
Glucose-Capillary: 167 mg/dL — ABNORMAL HIGH (ref 70–99)
Glucose-Capillary: 132 mg/dL — ABNORMAL HIGH (ref 70–99)
Glucose-Capillary: 180 mg/dL — ABNORMAL HIGH (ref 70–99)
Glucose-Capillary: 189 mg/dL — ABNORMAL HIGH (ref 70–99)
Glucose-Capillary: 126 mg/dL — ABNORMAL HIGH (ref 70–99)
Glucose-Capillary: 139 mg/dL — ABNORMAL HIGH (ref 70–99)

## 2021-05-04 LAB — CBC
HCT: 23.7 % — ABNORMAL LOW (ref 36.0–46.0)
Hemoglobin: 7.3 g/dL — ABNORMAL LOW (ref 12.0–15.0)
MCH: 31.5 pg (ref 26.0–34.0)
MCHC: 30.8 g/dL (ref 30.0–36.0)
MCV: 102.2 fL — ABNORMAL HIGH (ref 80.0–100.0)
Platelets: 199 10*3/uL (ref 150–400)
RBC: 2.32 MIL/uL — ABNORMAL LOW (ref 3.87–5.11)
RDW: 17.6 % — ABNORMAL HIGH (ref 11.5–15.5)
WBC: 12.5 10*3/uL — ABNORMAL HIGH (ref 4.0–10.5)
nRBC: 10 % — ABNORMAL HIGH (ref 0.0–0.2)

## 2021-05-04 LAB — CULTURE, BLOOD (ROUTINE X 2)
Culture: NO GROWTH
Special Requests: ADEQUATE

## 2021-05-04 LAB — MAGNESIUM: Magnesium: 2.6 mg/dL — ABNORMAL HIGH (ref 1.7–2.4)

## 2021-05-04 LAB — HEPARIN LEVEL (UNFRACTIONATED): Heparin Unfractionated: 0.34 IU/mL (ref 0.30–0.70)

## 2021-05-04 MED ORDER — MORPHINE SULFATE (PF) 2 MG/ML IV SOLN
2.0000 mg | INTRAVENOUS | Status: DC | PRN
Start: 2021-05-04 — End: 2021-05-07
  Administered 2021-05-07 (×2): 2 mg via INTRAVENOUS
  Filled 2021-05-04 (×2): qty 1

## 2021-05-04 MED ORDER — GLYCOPYRROLATE 0.2 MG/ML IJ SOLN
0.2000 mg | INTRAMUSCULAR | Status: DC | PRN
  Administered 2021-05-04: 0.2 mg via INTRAVENOUS
  Filled 2021-05-04: qty 1

## 2021-05-04 NOTE — TOC Progression Note (Signed)
Transition of Care [facility]) - Progression Note    Patient Details  Name: Leslie Bryant MRN: [id number] Date of Birth: November 15, 1940  Transition of Care [facility] CM/SW Contact  Leeroy Cha, RN Phone Number: 05/04/2021, 8:02 AM  Clinical Narrative:    Significant Hospital Events:  6/16 - Admitted to [facility] for SOB, hypoxia, multifocal PNA 6/17 - PCCM consulted for persistent hypoxia, mild lethargy, ICU admission -> intubated later in day. CT chest with bilateral diffuse GGO.  Failed PICC line insertion 6/18 AM - worsening AKI - creat 1.8. on vent 80% fio2, supnine. Sedated with fent gtt, versed gtt. On pressors neo. PCT high but culture negative so far. HS trop in 20s. RASS -4. On protoni and coffee ground mild returns noted this morning 6/18 PM - -in renal failure. Worsening creat. Dropping Ur op despite fluids. Bronch BAL -> 86% polys.  Alv  hge ruled out.  Elliquis now lower dose. Code changed to DNR + full medical care 6/19 renal consulted. 6/20 Remains sedated on vent minimally responsive on fentanyl drip with worsening renal failure, now off pressors 6/21 progressive multi - organ dysfunction including worsening renal failure, HD cath placed Right IJ. CRRT started.   mild liver injury with slight transaminitis, and continued encephalopathy. Cefepime completed. 6/22 amiodarone had to be restarted for af w/ RVR 6/23 starting Proning ->improved oxygenation and vent mechanics. Started low dose lopressor. Feeding tube placed post-pyloric 6/26 no longer requiring proning, FiO2 40% 6/27 tolerating weaning, down to Fentanyl 110mg, off pressors and antibiotics, 6/28 placed back on fentanyl and Versed overnight, 1 out of 2 blood cultures growing staph epi, started on vancomycin and low-dose neo 6/29 continued issues with agitation and vent dyssynchrony overnight requiring as needed fentanyl 6/30 no acute events overnight required only minimal as needed fentanyl pushes for vent compliance.  Mentation  continues to improve daily, currently able to follow simple commands but very deconditioned  toc plan of care: Following for progression,. Pt is awake and following commands, may need st snf when stable.   Expected Discharge Plan: Home/Self Care Barriers to Discharge: Continued Medical Work up  Expected Discharge Plan and Services Expected Discharge Plan: Home/Self Care   Discharge Planning Services: CM Consult   Living arrangements for the past 2 months: Single Family Home                                       Social Determinants of Health (SDOH) Interventions    Readmission Risk Interventions No flowsheet data found.

## 2021-05-04 NOTE — Progress Notes (Signed)
[place] for Heparin Indication: atrial fibrillation  Allergies  Allergen Reactions   Crestor [Rosuvastatin Calcium] Other (See Comments)    Shoulder pain   Crestor [Rosuvastatin]     Other reaction(s): Unknown   Lipitor [Atorvastatin Calcium] Other (See Comments)    Myalgias    Pravachol [Pravastatin] Other (See Comments)    myalgias   Penicillins Itching    Has patient had a PCN reaction causing immediate rash, facial/tongue/throat swelling, SOB or lightheadedness with hypotension: No Has patient had a PCN reaction causing severe rash involving mucus membranes or skin necrosis: No Has patient had a PCN reaction that required hospitalization No Has patient had a PCN reaction occurring within the last 10 years: No If all of the above answers are "NO", then may proceed with Cephalosporin use.     Patient Measurements: Height: 5\' 2"  (157.5 cm) Weight: 74.6 kg (164 lb 7.4 oz) IBW/kg (Calculated) : 50.1 Heparin Dosing Weight: 67 kg  Vital Signs: Temp: 99.1 F (37.3 C) (06/30 2000) Temp Source: Axillary (06/30 2000) BP: 112/45 (07/01 0000) Pulse Rate: 120 (07/01 0000)  Labs: Recent Labs    05/01/21 0400 05/01/21 1707 05/02/21 0442 05/02/21 1600 05/03/21 0500 05/03/21 1733 05/03/21 2200  HGB 8.6*  --  7.8*  --  7.9*  --   --   HCT 27.7*  --  25.8*  --  26.0*  --   --   PLT 147*  --  189  --  211  --   --   HEPARINUNFRC 0.39  --  0.32  --  <0.10*  --  0.34  CREATININE 0.54   < > 0.68 0.78 1.52* 1.69*  --    < > = values in this interval not displayed.    Estimated Creatinine Clearance: 25.1 mL/min (A) (by C-G formula based on SCr of 1.69 mg/dL (H)).  Assessment: 32 yoF admit 6/16 with ShOB, DOE, recurrent falls, BLE edema. PMH includes Afib on apixaban.  Due to worsening renal function, she was changed to heparin per pharmacy on 6/19. She started CRRT on 6/21. Heparin was held 6/29 for R groin hematoma.  Significant  Events: 6/30 OK to resume heparin per CCM as R groin hematoma stable w/o bleeding  Today, 05/04/2021:  HL therapeutic on 1050 units/hr Hgb low but stable from yesterday; Plt improved to WNL Remains on CRRT No bleeding or line interruptions per RN  Goal of Therapy:  Heparin level 0.3-0.7 units/ml Monitor platelets by anticoagulation protocol: Yes   Plan:  Continue heparin infusion at 1050 units/hr Daily heparin level and CBC Monitor R groin hematoma F/u for resolution of AKI and ability to resume Eliquis  Dolly Rias RPh 05/04/2021, 12:18 AM

## 2021-05-04 NOTE — Progress Notes (Addendum)
Attending Addendum Seen for respiratory failure Remains on CRRT Awake, weak, following some commands Copious secretions Rhonci on auscultation Has muscle wasting and anasarca  A: Klebsiella ARDS improved Question HCAP on ceftriaxone, cult neg Anemia, stable AKI progressed to ESRD on CRRT Afib on AC and amio Severe muscular deconditioning limiting weaning Positive fluid balance for admission but also near dry weight; however, does not account for muscle loss so probably still a bit volume up  P: CRRT with neg pull as tolerated Ceftriaxone through 7/6 Need to discuss options with family (A) extubation to comfort, (B) extubation with full care if does well vs. comfort if fails or (C) tracheostomy.  Option (C) would provide highest chance of recovery but apparently patient has prior directive that she would not want this.  She needs to mobilize if she has any hope of recovery and CRRT and ETT are hampering this.  Loree Fee will discuss with family later this AM.   Patient critically ill due to respiratory failure, renal failure Interventions to address this today CRRT, vent titration Risk of deterioration without these interventions is high  I personally spent 35 minutes providing critical care not including any separately billable procedures  Erskine Emery MD [facility] [place] epic messenger for cross cover needs If after hours, please call E-link       NAME:  Leslie Bryant MRN:  [id number] DOB:  [birth year] LOS: 10 ADMISSION DATE:  04/13/2021 CONSULTATION DATE: 04/20/2021 REFERRING MD:  Alfredia Ferguson - TRH CHIEF COMPLAINT:  SOB, hypoxic respiratory failure   Brief Narrative   80 year old female presented with presented to [facility] 6/16 via EMS for worsening SOB, hypoxia and concern for PNA.On ED evaluation patient was hypoxic on RA and in A-fib RVR .CXR demonsrated bilateral lower lobe infiltrates. Labs were notable for Na 123, BNP 467, Trop 20 and WBC 15K. Patient was  admitted to [facility].  PCCM consulted 6/17 for ongoing hypoxemic respiratory failure despite supplemental O2.  Pertinent Medical History:  A-fib Right breast cancer  Bowel perforation  HLD  Significant Hospital Events:  6/16 - Admitted to [facility] for SOB, hypoxia, multifocal PNA 6/17 - PCCM consulted for persistent hypoxia, mild lethargy, ICU admission -> intubated later in day. CT chest with bilateral diffuse GGO.  Failed PICC line insertion 6/18 AM - worsening AKI - creat 1.8. on vent 80% fio2, supnine. Sedated with fent gtt, versed gtt. On pressors neo. PCT high but culture negative so far. HS trop in 20s. RASS -4. On protoni and coffee ground mild returns noted this morning 6/18 PM - -in renal failure. Worsening creat. Dropping Ur op despite fluids. Bronch BAL -> 86% polys.  Alv  hge ruled out.  Elliquis now lower dose. Code changed to DNR + full medical care 6/19 renal consulted.  6/20 Remains sedated on vent minimally responsive on fentanyl drip with worsening renal failure, now off pressors 6/21 progressive multi - organ dysfunction including worsening renal failure, HD cath placed Right IJ. CRRT started.   mild liver injury with slight transaminitis, and continued encephalopathy. Cefepime completed.  6/22 amiodarone had to be restarted for af w/ RVR 6/23 starting Proning ->improved oxygenation and vent mechanics. Started low dose lopressor. Feeding tube placed post-pyloric  6/26 no longer requiring proning, FiO2 40% 6/27 tolerating weaning, down to Fentanyl 43mg, off pressors and antibiotics, 6/28 placed back on fentanyl and Versed overnight, 1 out of 2 blood cultures growing staph epi, started on vancomycin and low-dose neo 6/29 continued issues with agitation  and vent dyssynchrony overnight requiring as needed fentanyl 6/30 no acute events overnight required only minimal as needed fentanyl pushes for vent compliance.  Mentation continues to improve daily, currently able to follow simple  commands but very deconditioned  Interim History / Subjective:  No acute events overnight  No issues with CRRT overnight   Objective:  Blood pressure (!) 131/53, pulse (!) 116, temperature 98.3 F (36.8 C), temperature source Axillary, resp. rate (!) 31, height _0  (1.575 m), weight 74.5 kg, SpO2 98 %. CVP:  [0 mmHg-8 mmHg] 2 mmHg  Vent Mode: PSV FiO2 (%):  [30 %] 30 % PEEP:  [5 cmH20] 5 cmH20 Pressure Support:  [5 cmH20-10 cmH20] 10 cmH20   Intake/Output Summary (Last 24 hours) at 05/04/2021 7858 Last data filed at 05/04/2021 0700 Gross per 24 hour  Intake 1503.8 ml  Output 2396 ml  Net -892.2 ml    Filed Weights   05/02/21 0400 05/03/21 0503 05/04/21 0400  Weight: 74.3 kg 74.6 kg 74.5 kg   Physical exam General: Acute on chronic ill appearing elderly female lying in bed on mechanical vent in NAD  HEENT: ETT, MM pink/moist, PERRL,  Neuro: Opens eyes to verbal stimuli, can follow simple commands but very deconditioned and weak CV: s1s2 regular rate and rhythm, no murmur, rubs, or gallops,  PULM:  Clear to ascultation, tolerating vent, no added breath sounds,  GI: soft, bowel sounds active in all 4 quadrants, non-tender, non-distended, tolerating TF Extremities: warm/dry, generalized non-pitting edema  Skin: no rashes or lesions   Labs/imaging that I have personally reviewed:  CBC, BMP Leukocytosis stable  Resolved Hospital Problem List:   Septic shock-Pressors stopped 6/19 Klebsiella UTI, POA ARDS CAP Hypoglycemia  Transaminitis - improving  Assessment & Plan:  Acute Hypoxic Respiratory Failure Secondary to CAP and ARDS -resolved  -Intubated on 6/17, required intermittent pronation with improvement in FiO2 seen. Now supinated and able to tolerate PSV x 24hrs -Completed course of  Cefepime/Ceftriaxone on 6/21 Concern for evolving pneumonia -Patient began developing low-grade fever 6/25, repeat blood cultures and respiratory culture obtained 6/28.  Respiratory  culture with few gram-negative  P: Repeat resp culture with normal flora 6/30 Continue to tolerated PSV but concern that deconditioning will limit ability to tolerated extubation. Attempting to optimize for one way extubation  Continue ventilator support with lung protective strategies  Head of bed elevated 30 degrees. Plateau pressures less than 30 cm H20.  Follow intermittent chest x-ray and ABG.   Ensure adequate pulmonary hygiene  VAP bundle in place  PAD protocol  Acute metabolic encephalopathy - improved  History of giant cell arteritis on chronic steroids -Prednisone held 6/17, had been on high dose for several months  -Likely secondary to prolonged sedation P: Mentation is improved with decreased sedation  Minimized sedation  Delirium precautions   Atrial fibrillation on amiodarone, HFpEF -New onset right ventricular failure on admission -Preserved EF on echo 6/17 P: Heparin drip resumed 6/30 Close monitoring of hgb Continuous telemetry  Continue PO amiodarone and lopressor    Acute kidney injury-present on admission Oliguric with renal failure -Nephrology following, monitoring for signs of renal recovery P: Nephrology following, appreciate assistance Follow renal function Monitor urine output Trend Bmet Avoid nephrotoxins Ensure adequate renal perfusion  Optimize electrolytes    Anemia -Due to sepsis, blood loss with thigh hematoma, consumption from circuit.  No signs of active bleeding. P: Trend CBC  Transfuse per protocol  Hgb goal greater    RLE Hematoma of  the proximal thigh -Developed after A-line removal P: Supportive care  No signs of active bleeding   Goals of Care -Patient appears to have plateaued in her progression. I believe that a trial for a one way extubation need to occur soon, likely today. Will plan to meet with family today to help establish plan of care.    Best Practice   Diet/type: tubefeeds Pain/Anxiety/Delirium protocol  RASS goal: -1 to -2 VAP protocol (if indicated): Yes DVT prophylaxis: systemic heparin GI prophylaxis: PPI Glucose control:  SSI and Basal coverage  Central venous access:  Yes, and it is still needed   Arterial line:  N/A Foley:  N/A- Mobility:  bed rest  PT consulted: N/A Antibiotics:cefepime and vanc Antibiotic de-escalation: Escalated to include cefepime Stop date: N/A Code Status:  DNR Last date of multidisciplinary goals of care discussion:  6/29 daughter states at this time family would like to discuss further options but likely not pursue trach or PEG.  Family is requesting head CT to further evaluate any neurological deficits ccm prognosis: Life-threating Disposition: remains critically ill, will stay in intensive care  CRITICAL CARE Performed by: Whitney D. Harris  Total critical care time: 37 minutes  Critical care time was exclusive of separately billable procedures and treating other patients.  Critical care was necessary to treat or prevent imminent or life-threatening deterioration.  Critical care was time spent personally by me on the following activities: development of treatment plan with patient and/or surrogate as well as nursing, discussions with consultants, evaluation of patient's response to treatment, examination of patient, obtaining history from patient or surrogate, ordering and performing treatments and interventions, ordering and review of laboratory studies, ordering and review of radiographic studies, pulse oximetry and re-evaluation of patient's condition.  Whitney D. Kenton Kingfisher, NP-C [facility] Personal contact information can be found on Amion  05/04/2021, 7:05 AM

## 2021-05-04 NOTE — Procedures (Signed)
Extubation Procedure Note  Patient Details:   Name: Leslie Bryant DOB: 08-Mar-1941 MRN: [id number]   Airway Documentation:    Vent end date: 05/04/21 Vent end time: 1320   Evaluation  O2 sats: stable throughout Complications: No apparent complications Patient did tolerate procedure well. Bilateral Breath Sounds: Diminished, Rhonchi   No  One way extubation Johnette Abraham 05/04/2021, 1:28 PM

## 2021-05-04 NOTE — Plan of Care (Addendum)
  Interdisciplinary Goals of Care Family Meeting   Date carried out:: 05/04/2021  Location of the meeting: Bedside  Member's involved: Nurse Practitioner, Bedside Registered Nurse, and Family Member or next of kin  Durable Power of Attorney or acting medical decision maker: Shared among family    Discussion: We discussed goals of care for Leslie Bryant .Leslie Bryant appears to have reached a plateau today. She continues to tolerated PSV well and mentation has improved but she is profoundly deconditioned and at this point continued endotracheal ventilation is not going to benefit her. I again discussed with family all options including aggressive measures such as trach and PEG and family all agree that Leslie Bryant would not want this. Therefore, decision was made to proceed with one way extubation today. The family is hopeful for continued progression but understand the need to proceed with comfort measures if Leslie Bryant were to begin displaying signs of respiratory distress.    Code status: Full DNR  Disposition: Continue current acute care with plans to transition to comfort if signs of distress occur   Time spent for the meeting: 35  Tatia Petrucci D. Kenton Kingfisher, NP-C [facility] Personal contact information can be found on Amion  05/04/2021, 4:09 PM

## 2021-05-04 NOTE — Progress Notes (Signed)
Admit: 04/16/2021 LOS: 15  14F nl GFR with anuric AKI on CRRT in setting of CAP/ARDS/VDRF, shock now off pressors, hx/o giant cell arteritis  Subjective:  Started CRRT 6/21 No UOP overnight I/O - 190 cc yest Wt's stable at 74kg    06/30 0701 - 07/01 0700 In: 1503.8 [I.V.:365.1; NG/GT:978.7; IV Piggyback:100] Out: 2396 [Stool:260]  Filed Weights   05/02/21 0400 05/03/21 0503 05/04/21 0400  Weight: 74.3 kg 74.6 kg 74.5 kg    Scheduled Meds:  amiodarone  200 mg Per Tube Daily   chlorhexidine gluconate (MEDLINE KIT)  15 mL Mouth Rinse BID   Chlorhexidine Gluconate Cloth  6 each Topical Daily   docusate  100 mg Per Tube Daily   feeding supplement (PROSource TF)  90 mL Per Tube QID   insulin aspart  0-15 Units Subcutaneous Q4H   insulin aspart  7 Units Subcutaneous Q4H   insulin glargine  15 Units Subcutaneous Daily   ipratropium  0.5 mg Nebulization Q6H   levalbuterol  0.63 mg Nebulization Q6H   mouth rinse  15 mL Mouth Rinse 10 times per day   metoprolol tartrate  25 mg Per Tube BID   multivitamin  1 tablet Per NG tube QHS   pantoprazole sodium  40 mg Per Tube BID   polyethylene glycol  17 g Per Tube Daily   predniSONE  10 mg Per Tube BID WC   sodium chloride flush  10-40 mL Intracatheter Q12H   Continuous Infusions:  sodium chloride Stopped (05/03/21 0705)   sodium chloride Stopped (05/03/21 1351)   cefTRIAXone (ROCEPHIN)  IV Stopped (05/04/21 0907)   feeding supplement (VITAL 1.5 CAL) 1,000 mL (05/03/21 2028)   heparin 1,050 Units/hr (05/04/21 1200)   phenylephrine (NEO-SYNEPHRINE) Adult infusion Stopped (05/02/21 1250)   prismasol BGK 2/2.5 replacement solution 500 mL/hr at 05/02/21 1816   prismasol BGK 2/2.5 replacement solution 500 mL/hr at 05/04/21 1004   prismasol BGK 4/2.5 1,500 mL/hr at 05/04/21 0930   PRN Meds:.Place/Maintain arterial line **AND** sodium chloride, acetaminophen, acetaminophen, fentaNYL (SUBLIMAZE) injection, heparin, lip balm, metoprolol  tartrate, sodium chloride flush  Current Labs: reviewed  6/16 UA w/o hematuria, trace protein 6/19 Renal US no HN  Physical Exam:  Blood pressure (!) 129/52, pulse (!) 117, temperature 99.3 F (37.4 C), temperature source Axillary, resp. rate (!) 34, height 5' 2"  (1.575 m), weight 74.5 kg, SpO2 98 %. Intubated sedated, deconditioned Tachy IRIR Coarse bs b/l 1+ LE and UE edema S/nt/nd No rashes/lesions ETT in place, NCAT  Assessment/ Plan AKI - anuric, likely ATN in setting of #2 and #3. Given prolonged anuric state, progressive azotemia, and hypervolemia pt was started on CRRT 6/21. Had clotted catheter that responded to TPA dwell on 6/30.  Volume - under admit wts, some edema, excess secretions now so we are back to pulling 50- 100 cc/hr as tolerated. ARDS/CAP/VDRF: per CCM, improving significantly by CXR but pt very weak and questionable if she can be extubated. CCM to decide today along w/ family.  Hypotension - resolved, off pressors.  Giant Cell arteritis: chronic IS, now on stress dosing Anemia: transfusion per primary, for pRBC 6/22 [name] UTI: s/p course of cefepime AFib- on hep gtt     Sol Blazing  05/04/2021, 12:59 PM  Recent Labs  Lab 05/03/21 0500 05/03/21 1733 05/04/21 0500  NA 136 137 137  K 4.4 4.6 3.9  CL 105 107 102  CO2 23 21* 26  GLUCOSE 207* 243* 146*  BUN 65* 78*  47*  CREATININE 1.52* 1.69* 1.08*  CALCIUM 8.2* 7.8* 7.9*  PHOS 5.5* 5.3* 3.0    Recent Labs  Lab 05/02/21 0442 05/03/21 0500 05/04/21 0500  WBC 14.2* 15.0* 12.5*  HGB 7.8* 7.9* 7.3*  HCT 25.8* 26.0* 23.7*  MCV 103.2* 103.6* 102.2*  PLT 189 211 199

## 2021-05-04 NOTE — Progress Notes (Signed)
[place] for Heparin Indication: atrial fibrillation  Allergies  Allergen Reactions   Crestor [Rosuvastatin Calcium] Other (See Comments)    Shoulder pain   Crestor [Rosuvastatin]     Other reaction(s): Unknown   Lipitor [Atorvastatin Calcium] Other (See Comments)    Myalgias    Pravachol [Pravastatin] Other (See Comments)    myalgias   Penicillins Itching    Has patient had a PCN reaction causing immediate rash, facial/tongue/throat swelling, SOB or lightheadedness with hypotension: No Has patient had a PCN reaction causing severe rash involving mucus membranes or skin necrosis: No Has patient had a PCN reaction that required hospitalization No Has patient had a PCN reaction occurring within the last 10 years: No If all of the above answers are "NO", then may proceed with Cephalosporin use.     Patient Measurements: Height: 5\' 2"  (157.5 cm) Weight: 74.5 kg (164 lb 3.9 oz) IBW/kg (Calculated) : 50.1 Heparin Dosing Weight: 67 kg  Vital Signs: Temp: 98.3 F (36.8 C) (07/01 0400) Temp Source: Axillary (07/01 0400) BP: 114/52 (07/01 0500) Pulse Rate: 115 (07/01 0500)  Labs: Recent Labs    05/02/21 0442 05/02/21 1600 05/03/21 0500 05/03/21 1733 05/03/21 2200 05/04/21 0500  HGB 7.8*  --  7.9*  --   --  7.3*  HCT 25.8*  --  26.0*  --   --  23.7*  PLT 189  --  211  --   --  199  HEPARINUNFRC 0.32  --  <0.10*  --  0.34 0.34  CREATININE 0.68 0.78 1.52* 1.69*  --   --     Estimated Creatinine Clearance: 25.1 mL/min (A) (by C-G formula based on SCr of 1.69 mg/dL (H)).  Assessment: 37 yoF admit 6/16 with ShOB, DOE, recurrent falls, BLE edema. PMH includes Afib on apixaban.  Due to worsening renal function, she was changed to heparin per pharmacy on 6/19. She started CRRT on 6/21. Heparin was held 6/29 for R groin hematoma.  Significant Events: 6/30 OK to resume heparin per CCM as R groin hematoma stable w/o bleeding  Today,  05/04/2021:  HL therapeutic on 1050 units/hr Hgb low but stable ; Plt improved to WNL Remains on CRRT No bleeding or line interruptions per RN  Goal of Therapy:  Heparin level 0.3-0.7 units/ml Monitor platelets by anticoagulation protocol: Yes   Plan:  Continue heparin infusion at 1050 units/hr Daily heparin level and CBC Monitor R groin hematoma F/u for resolution of AKI and ability to resume Eliquis  Dolly Rias RPh 05/04/2021, 5:54 AM

## 2021-05-04 DEATH — deceased

## 2021-05-05 LAB — RENAL FUNCTION PANEL
Albumin: 2 g/dL — ABNORMAL LOW (ref 3.5–5.0)
Albumin: 2 g/dL — ABNORMAL LOW (ref 3.5–5.0)
Anion gap: 10 (ref 5–15)
Anion gap: 8 (ref 5–15)
BUN: 34 mg/dL — ABNORMAL HIGH (ref 8–23)
BUN: 33 mg/dL — ABNORMAL HIGH (ref 8–23)
CO2: 26 mmol/L (ref 22–32)
CO2: 27 mmol/L (ref 22–32)
Calcium: 8.1 mg/dL — ABNORMAL LOW (ref 8.9–10.3)
Calcium: 8 mg/dL — ABNORMAL LOW (ref 8.9–10.3)
Chloride: 101 mmol/L (ref 98–111)
Chloride: 103 mmol/L (ref 98–111)
Creatinine, Ser: 0.75 mg/dL (ref 0.44–1.00)
Creatinine, Ser: 0.6 mg/dL (ref 0.44–1.00)
GFR, Estimated: >60 mL/min (ref >60)
GFR, Estimated: >60 mL/min (ref >60)
Glucose, Bld: 131 mg/dL — ABNORMAL HIGH (ref 70–99)
Glucose, Bld: 180 mg/dL — ABNORMAL HIGH (ref 70–99)
Phosphorus: 3.3 mg/dL (ref 2.5–4.6)
Phosphorus: 2.7 mg/dL (ref 2.5–4.6)
Potassium: 4 mmol/L (ref 3.5–5.1)
Potassium: 3.7 mmol/L (ref 3.5–5.1)
Sodium: 137 mmol/L (ref 135–145)
Sodium: 138 mmol/L (ref 135–145)

## 2021-05-05 LAB — CBC
HCT: 25.6 % — ABNORMAL LOW (ref 36.0–46.0)
Hemoglobin: 8 g/dL — ABNORMAL LOW (ref 12.0–15.0)
MCH: 32.4 pg (ref 26.0–34.0)
MCHC: 31.3 g/dL (ref 30.0–36.0)
MCV: 103.6 fL — ABNORMAL HIGH (ref 80.0–100.0)
Platelets: 224 10*3/uL (ref 150–400)
RBC: 2.47 MIL/uL — ABNORMAL LOW (ref 3.87–5.11)
RDW: 17.8 % — ABNORMAL HIGH (ref 11.5–15.5)
WBC: 15.8 10*3/uL — ABNORMAL HIGH (ref 4.0–10.5)
nRBC: 9.7 % — ABNORMAL HIGH (ref 0.0–0.2)

## 2021-05-05 LAB — GLUCOSE, CAPILLARY
Glucose-Capillary: 136 mg/dL — ABNORMAL HIGH (ref 70–99)
Glucose-Capillary: 133 mg/dL — ABNORMAL HIGH (ref 70–99)
Glucose-Capillary: 159 mg/dL — ABNORMAL HIGH (ref 70–99)
Glucose-Capillary: 135 mg/dL — ABNORMAL HIGH (ref 70–99)
Glucose-Capillary: 137 mg/dL — ABNORMAL HIGH (ref 70–99)
Glucose-Capillary: 106 mg/dL — ABNORMAL HIGH (ref 70–99)

## 2021-05-05 LAB — HEPARIN LEVEL (UNFRACTIONATED): Heparin Unfractionated: 0.37 IU/mL (ref 0.30–0.70)

## 2021-05-05 LAB — MAGNESIUM: Magnesium: 2.6 mg/dL — ABNORMAL HIGH (ref 1.7–2.4)

## 2021-05-05 MED ORDER — SODIUM CHLORIDE 0.9 % IV BOLUS
500.0000 mL | Freq: Once | INTRAVENOUS | Status: AC
  Administered 2021-05-05: 500 mL via INTRAVENOUS

## 2021-05-05 MED ORDER — METOPROLOL TARTRATE 25 MG/10 ML ORAL SUSPENSION
12.5000 mg | Freq: Two times a day (BID) | ORAL | Status: DC
  Administered 2021-05-05 – 2021-05-06 (×3): 12.5 mg
  Filled 2021-05-05: qty 10
  Filled 2021-05-05 (×2): qty 5
  Filled 2021-05-05: qty 10
  Filled 2021-05-05: qty 5
  Filled 2021-05-05: qty 10

## 2021-05-05 MED ORDER — SODIUM CHLORIDE 0.9 % IN NEBU
3.0000 mL | INHALATION_SOLUTION | Freq: Three times a day (TID) | RESPIRATORY_TRACT | Status: DC | PRN
  Filled 2021-05-05 (×2): qty 3

## 2021-05-05 NOTE — Progress Notes (Addendum)
NAME:  Leslie Bryant MRN:  [id number] DOB:  06/18/1941 LOS: 53 ADMISSION DATE:  04/21/2021 CONSULTATION DATE: 04/20/2021 REFERRING MD:  Alfredia Ferguson - TRH CHIEF COMPLAINT:  SOB, hypoxic respiratory failure   Brief Narrative   80 year old female presented with presented to [facility] 6/16 via EMS for worsening SOB, hypoxia and concern for PNA.On ED evaluation patient was hypoxic on RA and in A-fib RVR .CXR demonsrated bilateral lower lobe infiltrates. Patient was admitted to [facility].  PCCM consulted 6/17 for ongoing hypoxemic respiratory failure despite supplemental O2.  Pertinent Medical History:  A-fib Right breast cancer  Bowel perforation  HLD  Significant Hospital Events:  6/16 Admitted to [facility] for SOB, hypoxia, multifocal PNA 6/17 PCCM consulted for persistent hypoxia, mild lethargy, ICU admission -> intubated later in day. CT chest with bilateral diffuse GGO.  Failed PICC line insertion 6/18 AM worsening AKI - creat 1.8. on vent 80% fio2, supnine. Sedated with fent gtt, versed gtt. On pressors neo. PCT high but culture negative so far. HS trop in 20s. RASS -4. On protoni and coffee ground mild returns noted this morning 6/18 PM in renal failure. Worsening creat. Dropping Ur op despite fluids. Bronch BAL -> 86% polys.  Alv  hge ruled out.  Elliquis now lower dose. Code changed to DNR + full medical care 6/19 renal consulted.  6/20 Remains sedated on vent minimally responsive on fentanyl drip with worsening renal failure, now off pressors 6/21 progressive multi - organ dysfunction including worsening renal failure, HD cath placed Right IJ. CRRT started.  mild liver injury with slight transaminitis, and continued encephalopathy. Cefepime completed.  6/22 amiodarone had to be restarted for af w/ RVR 6/23 starting Proning ->improved oxygenation and vent mechanics. Started low dose lopressor. Feeding tube placed post-pyloric  6/26 no longer requiring proning, FiO2 40% 6/27 tolerating weaning, down to  Fentanyl 44mg, off pressors and antibiotics, 6/28 placed back on fentanyl and Versed overnight, 1 out of 2 blood cultures growing staph epi, started on vancomycin and low-dose neo 6/29 continued issues with agitation and vent dyssynchrony overnight requiring as needed fentanyl 6/30 no acute events overnight required only minimal as needed fentanyl pushes for vent compliance.  Mentation continues to improve daily, currently able to follow simple commands but very deconditioned 7/1 DNR established, no reintubation if fails.    Interim History / Subjective:  Afebrile  Remains on CRRT, BP soft > given bolus per Nephrology  In/out AF / NSR  [place] O2 4L  Glucose range 131-159  Objective:  Blood pressure (!) 81/31, pulse 97, temperature (!) 97.5 F (36.4 C), temperature source Axillary, resp. rate (!) 27, height _0  (1.575 m), weight 69.6 kg, SpO2 100 %. CVP:  [0 mmHg-2 mmHg] 0 mmHg      Intake/Output Summary (Last 24 hours) at 05/05/2021 1205 Last data filed at 05/05/2021 1100 Gross per 24 hour  Intake 2175.23 ml  Output 2592 ml  Net -416.77 ml   Filed Weights   05/03/21 0503 05/04/21 0400 05/05/21 0500  Weight: 74.6 kg 74.5 kg 69.6 kg   Physical Exam: General: ill appearing adult female lying in bed in NAD HEENT: MM pink/dry, anicteric, small bore feeding tube in nose, ecchymosis on right side of face/eye (old) Neuro: opens eyes to voice, will follow simple commands but very debilitated / weak CV: s1s2 RRR, no m/r/g PULM: non-labored, clear bilaterally  GI: soft, bsx4 active  Extremities: warm/dry, generalized 2+ dependent edema  Skin: no rashes or lesions.  R groin bruising improved   Resolved Hospital Problem List:   Septic shock - Pressors stopped 6/19 Klebsiella UTI, POA ARDS CAP Hypoglycemia  Transaminitis   Assessment & Plan:   Acute Hypoxic Respiratory Failure Secondary to CAP and ARDS -resolved  Concern for evolving pneumonia. Intubated on 6/17, required  intermittent prone positioning. Completed course of  Cefepime/Ceftriaxone on 6/21 Patient began developing low-grade fever 6/25, repeat cultures 6/28.  -wean O2 for sats >90% -pulmonary hygiene - IS, mobilize  -PAD protocol  -if fails, no reintubation  -continue ceftriaxone   Acute metabolic encephalopathy   History of giant cell arteritis on chronic steroids Prednisone held 6/17, had been on high dose for several months. Likely secondary to prolonged sedation Mental status improved with decreased sedation.  -minimize sedating medications -promote sleep / wake cycle -PT efforts as able   Atrial fibrillation on amiodarone, HFpEF New onset right ventricular failure on admission. Preserved EF on echo 6/17 -heparin gtt restarted 6/30, per pharmacy  -follow Hgb trend, monitor for bleeding  -tele monitoring  -continue lopressor, reduce dose to 12.5 BID with soft pressures / fluid bolus need 7/2 am -continue amiodarone   Acute kidney injury (POA) Oliguric with renal failure -appreciate Nephrology input  -Trend BMP / urinary output -Replace electrolytes as indicated -Avoid nephrotoxic agents, ensure adequate renal perfusion  Anemia Due to sepsis, blood loss with thigh hematoma, consumption from circuit.  No signs of active bleeding. -follow CBC  -transfuse per protocol  -hgb goal >7%  RLE Hematoma of the proximal thigh Developed after A-line removal -supportive care, monitor site    Best Practice   Diet/type: tubefeeds Pain/Anxiety/Delirium protocol Not indicated and RASS goal: 0 to -1 VAP protocol (if indicated): Not indicated DVT prophylaxis: systemic heparin GI prophylaxis: PPI Glucose control:  SSI and Basal coverage  Central venous access:  Yes, and it is still needed   Arterial line:  N/A Foley:  N/A Mobility:  bed rest  Code Status:  DNR Last date of multidisciplinary goals of care discussion:  7/1, see note. DNR / no reintubation if declines.   ccm prognosis:  Life-threating Disposition: remains critically ill, will stay in intensive care    Noe Gens, MSN, APRN, NP-C, AGACNP-BC [place] [facility] 05/05/2021, 12:20 PM   Please see [email or web address] for pager details.   From 7A-7P if no response, please call [phone] After hours, please call ELink [phone]

## 2021-05-05 NOTE — Progress Notes (Signed)
Admit: 04/18/2021 LOS: 16  45F nl GFR with anuric AKI on CRRT in setting of CAP/ARDS/VDRF, shock now off pressors, hx/o giant cell arteritis  Subjective:  Started CRRT 6/21 No UOP overnight Wt's down at 69kg Extubated yest    07/01 0701 - 07/02 0700 In: 1849.9 [I.V.:273.8; NG/GT:1450; IV Piggyback:96.1] Out: 3223 [Stool:325]  Filed Weights   05/03/21 0503 05/04/21 0400 05/05/21 0500  Weight: 74.6 kg 74.5 kg 69.6 kg    Scheduled Meds:  amiodarone  200 mg Per Tube Daily   chlorhexidine gluconate (MEDLINE KIT)  15 mL Mouth Rinse BID   Chlorhexidine Gluconate Cloth  6 each Topical Daily   docusate  100 mg Per Tube Daily   feeding supplement (PROSource TF)  90 mL Per Tube QID   insulin aspart  0-15 Units Subcutaneous Q4H   insulin aspart  7 Units Subcutaneous Q4H   insulin glargine  15 Units Subcutaneous Daily   ipratropium  0.5 mg Nebulization Q6H   levalbuterol  0.63 mg Nebulization Q6H   mouth rinse  15 mL Mouth Rinse 10 times per day   metoprolol tartrate  25 mg Per Tube BID   multivitamin  1 tablet Per NG tube QHS   pantoprazole sodium  40 mg Per Tube BID   polyethylene glycol  17 g Per Tube Daily   predniSONE  10 mg Per Tube BID WC   sodium chloride flush  10-40 mL Intracatheter Q12H   Continuous Infusions:  sodium chloride Stopped (05/03/21 0705)   sodium chloride Stopped (05/03/21 1351)   cefTRIAXone (ROCEPHIN)  IV Stopped (05/05/21 1004)   feeding supplement (VITAL 1.5 CAL) 1,000 mL (05/04/21 2025)   heparin 1,050 Units/hr (05/05/21 1100)   prismasol BGK 2/2.5 replacement solution 500 mL/hr at 05/05/21 0549   prismasol BGK 2/2.5 replacement solution 500 mL/hr at 05/05/21 0554   prismasol BGK 4/2.5 1,500 mL/hr at 05/05/21 1151   PRN Meds:.Place/Maintain arterial line **AND** sodium chloride, acetaminophen, acetaminophen, fentaNYL (SUBLIMAZE) injection, glycopyrrolate, heparin, lip balm, metoprolol tartrate, morphine injection, sodium chloride flush  Current  Labs: reviewed  6/16 UA w/o hematuria, trace protein 6/19 Renal US no HN  Physical Exam:  Blood pressure (!) 81/31, pulse 97, temperature (!) 97.5 F (36.4 C), temperature source Axillary, resp. rate (!) 27, height 5' 2"  (1.575 m), weight 69.6 kg, SpO2 100 %. Intubated sedated, deconditioned Tachy IRIR Coarse bs b/l 1+ LE and UE edema S/nt/nd No rashes/lesions ETT in place, NCAT  Assessment/ Plan AKI - anuric, likely ATN in setting of #2 and #3. Given prolonged anuric state, progressive azotemia, and hypervolemia pt was started on CRRT 6/21. Had clotted catheter that responded to TPA dwell on 6/30. Theoretically could send to [facility] for iHD but given her tenuous status overall is probably best to keep her here. Cont CRRT.  Volume - under admit wts and BP's dropped this am, will bolus 500 cc and go back to keeping her +50 cc/hr for intravasc vol depletion. ARDS/CAP/VDRF: per CCM, improved ARDS but very weak. Extubated on 7/1. Pt is now a full DNR.  Hypotension - resolved, off pressors.  Giant Cell arteritis: chronic IS, now on stress dosing Anemia: transfusion per primary, for pRBC 6/22 [name] UTI: s/p course of cefepime AFib- on hep gtt DNR     Sol Blazing  05/05/2021, 12:08 PM  Recent Labs  Lab 05/03/21 1733 05/04/21 0500 05/05/21 0530  NA 137 137 137  K 4.6 3.9 4.0  CL 107 102 101  CO2 21*  26 26  GLUCOSE 243* 146* 131*  BUN 78* 47* 34*  CREATININE 1.69* 1.08* 0.75  CALCIUM 7.8* 7.9* 8.1*  PHOS 5.3* 3.0 3.3    Recent Labs  Lab 05/03/21 0500 05/04/21 0500 05/05/21 0432  WBC 15.0* 12.5* 15.8*  HGB 7.9* 7.3* 8.0*  HCT 26.0* 23.7* 25.6*  MCV 103.6* 102.2* 103.6*  PLT 211 199 224

## 2021-05-05 NOTE — Progress Notes (Signed)
[place] for Heparin Indication: atrial fibrillation  Allergies  Allergen Reactions   Crestor [Rosuvastatin Calcium] Other (See Comments)    Shoulder pain   Crestor [Rosuvastatin]     Other reaction(s): Unknown   Lipitor [Atorvastatin Calcium] Other (See Comments)    Myalgias    Pravachol [Pravastatin] Other (See Comments)    myalgias   Penicillins Itching    Has patient had a PCN reaction causing immediate rash, facial/tongue/throat swelling, SOB or lightheadedness with hypotension: No Has patient had a PCN reaction causing severe rash involving mucus membranes or skin necrosis: No Has patient had a PCN reaction that required hospitalization No Has patient had a PCN reaction occurring within the last 10 years: No If all of the above answers are "NO", then may proceed with Cephalosporin use.     Patient Measurements: Height: 5\' 2"  (157.5 cm) Weight: 69.6 kg (153 lb 7 oz) IBW/kg (Calculated) : 50.1 Heparin Dosing Weight: 67 kg  Vital Signs: Temp: 95 F (35 C) (07/02 0800) Temp Source: Axillary (07/02 0800) BP: 93/50 (07/02 0800) Pulse Rate: 104 (07/02 0800)  Labs: Recent Labs    05/03/21 0500 05/03/21 1733 05/03/21 2200 05/04/21 0500 05/05/21 0432 05/05/21 0500 05/05/21 0530  HGB 7.9*  --   --  7.3* 8.0*  --   --   HCT 26.0*  --   --  23.7* 25.6*  --   --   PLT 211  --   --  199 224  --   --   HEPARINUNFRC <0.10*  --  0.34 0.34  --  0.37  --   CREATININE 1.52* 1.69*  --  1.08*  --   --  0.75    Estimated Creatinine Clearance: 51.3 mL/min (by C-G formula based on SCr of 0.75 mg/dL).  Assessment: 48 yoF admit 6/16 with ShOB, DOE, recurrent falls, BLE edema. PMH includes Afib on apixaban.  Due to worsening renal function, she was changed to heparin per pharmacy on 6/19. She started CRRT on 6/21. Heparin was held 6/29 for R groin hematoma.  Significant Events: 6/30 OK to resume heparin per CCM as R groin hematoma stable w/o  bleeding  Today, 05/05/2021:  HL = 0.37 is therapeutic on 1050 units/hr Hgb low but stable ; Plt improved to WNL Remains on CRRT No bleeding or line interruptions per RN  Goal of Therapy:  Heparin level 0.3-0.7 units/ml Monitor platelets by anticoagulation protocol: Yes   Plan:  Continue heparin infusion at 1050 units/hr Daily heparin level and CBC Monitor R groin hematoma F/u for resolution of AKI and ability to resume Eliquis   Eudelia Bunch, Pharm.D 05/05/2021 8:38 AM

## 2021-05-05 NOTE — Progress Notes (Addendum)
[place] Progress Note Patient Name: Leslie Bryant DOB: 1941/06/19 MRN: [id number]   Date of Service  05/05/2021  HPI/Events of Note  Nursing concern for upper airway and throat congestion. Request for saline nebs.  eICU Interventions  Plan: 3% NaCl  4 mL via neb PRN respiratory mucous congestion..     Intervention Category Major Interventions: Respiratory failure - evaluation and management  Seva Chancy Eugene 05/05/2021, 8:11 PM

## 2021-05-05 NOTE — Progress Notes (Signed)
Transfer patient to [facility] as of 7/3 for primary service. PCCM will sign off. Please call back if new needs arise.      Leslie Gens, MSN, APRN, NP-C, AGACNP-BC [facility] 05/05/2021, 4:19 PM   Please see [email or web address] for pager details.   From 7A-7P if no response, please call [phone] After hours, please call ELink [phone]

## 2021-05-05 NOTE — Progress Notes (Signed)
Dr. Jonnie Finner notified of patient dropping systolic blood pressures into the 70's. CVP not reading, access pressure increasing. Instructed to keep patient net positive 50 cc every hour and bolus the patient 500 cc of normal saline. Patient is drowsy but will awaken to stimulation, following simple commands this morning. BP already increasing into the 62'B systolic. Will continue to closely monitor at this time.

## 2021-05-06 LAB — RENAL FUNCTION PANEL
Albumin: 1.9 g/dL — ABNORMAL LOW (ref 3.5–5.0)
Albumin: 2 g/dL — ABNORMAL LOW (ref 3.5–5.0)
Anion gap: 10 (ref 5–15)
Anion gap: 10 (ref 5–15)
BUN: 32 mg/dL — ABNORMAL HIGH (ref 8–23)
BUN: 32 mg/dL — ABNORMAL HIGH (ref 8–23)
CO2: 26 mmol/L (ref 22–32)
CO2: 26 mmol/L (ref 22–32)
Calcium: 7.9 mg/dL — ABNORMAL LOW (ref 8.9–10.3)
Calcium: 7.9 mg/dL — ABNORMAL LOW (ref 8.9–10.3)
Chloride: 101 mmol/L (ref 98–111)
Chloride: 101 mmol/L (ref 98–111)
Creatinine, Ser: 0.75 mg/dL (ref 0.44–1.00)
Creatinine, Ser: 0.71 mg/dL (ref 0.44–1.00)
GFR, Estimated: >60 mL/min (ref >60)
GFR, Estimated: >60 mL/min (ref >60)
Glucose, Bld: 125 mg/dL — ABNORMAL HIGH (ref 70–99)
Glucose, Bld: 130 mg/dL — ABNORMAL HIGH (ref 70–99)
Phosphorus: 3.3 mg/dL (ref 2.5–4.6)
Phosphorus: 3 mg/dL (ref 2.5–4.6)
Potassium: 3.4 mmol/L — ABNORMAL LOW (ref 3.5–5.1)
Potassium: 3.9 mmol/L (ref 3.5–5.1)
Sodium: 137 mmol/L (ref 135–145)
Sodium: 137 mmol/L (ref 135–145)

## 2021-05-06 LAB — HEMOGLOBIN AND HEMATOCRIT, BLOOD
HCT: 26.4 % — ABNORMAL LOW (ref 36.0–46.0)
Hemoglobin: 8.3 g/dL — ABNORMAL LOW (ref 12.0–15.0)

## 2021-05-06 LAB — GLUCOSE, CAPILLARY
Glucose-Capillary: 123 mg/dL — ABNORMAL HIGH (ref 70–99)
Glucose-Capillary: 108 mg/dL — ABNORMAL HIGH (ref 70–99)
Glucose-Capillary: 164 mg/dL — ABNORMAL HIGH (ref 70–99)
Glucose-Capillary: 124 mg/dL — ABNORMAL HIGH (ref 70–99)
Glucose-Capillary: 104 mg/dL — ABNORMAL HIGH (ref 70–99)
Glucose-Capillary: 191 mg/dL — ABNORMAL HIGH (ref 70–99)

## 2021-05-06 LAB — HEPARIN LEVEL (UNFRACTIONATED): Heparin Unfractionated: 0.31 IU/mL (ref 0.30–0.70)

## 2021-05-06 LAB — CBC
HCT: 22.3 % — ABNORMAL LOW (ref 36.0–46.0)
Hemoglobin: 6.9 g/dL — CL (ref 12.0–15.0)
MCH: 31.9 pg (ref 26.0–34.0)
MCHC: 30.9 g/dL (ref 30.0–36.0)
MCV: 103.2 fL — ABNORMAL HIGH (ref 80.0–100.0)
Platelets: 194 10*3/uL (ref 150–400)
RBC: 2.16 MIL/uL — ABNORMAL LOW (ref 3.87–5.11)
RDW: 17.8 % — ABNORMAL HIGH (ref 11.5–15.5)
WBC: 14.3 10*3/uL — ABNORMAL HIGH (ref 4.0–10.5)
nRBC: 11.6 % — ABNORMAL HIGH (ref 0.0–0.2)

## 2021-05-06 LAB — PREPARE RBC (CROSSMATCH)

## 2021-05-06 LAB — MAGNESIUM: Magnesium: 2.5 mg/dL — ABNORMAL HIGH (ref 1.7–2.4)

## 2021-05-06 MED ORDER — SODIUM CHLORIDE 0.9% IV SOLUTION: Freq: Once | INTRAVENOUS | Status: DC

## 2021-05-06 MED ORDER — SODIUM CHLORIDE 3 % IN NEBU
4.0000 mL | INHALATION_SOLUTION | RESPIRATORY_TRACT | Status: DC | PRN
  Administered 2021-05-06: 4 mL via RESPIRATORY_TRACT
  Filled 2021-05-06 (×2): qty 4

## 2021-05-06 NOTE — Progress Notes (Signed)
Admit: 05/02/2021 LOS: 34  34F nl GFR with anuric AKI on CRRT in setting of CAP/ARDS/VDRF, shock now off pressors, hx/o giant cell arteritis  Subjective:  Started CRRT 6/21, D#13 today No UOP overnight Wt's stable at 72kg Remains extubated but very weak    07/02 0701 - 07/03 0700 In: 2093.6 [I.V.:291.6; NG/GT:1060; IV Piggyback:597] Out: 971 [Stool:460]  Filed Weights   05/04/21 0400 05/05/21 0500 05/06/21 0500  Weight: 74.5 kg 69.6 kg 71.1 kg    Scheduled Meds:  sodium chloride   Intravenous Once   amiodarone  200 mg Per Tube Daily   chlorhexidine gluconate (MEDLINE KIT)  15 mL Mouth Rinse BID   Chlorhexidine Gluconate Cloth  6 each Topical Daily   docusate  100 mg Per Tube Daily   feeding supplement (PROSource TF)  90 mL Per Tube QID   insulin aspart  0-15 Units Subcutaneous Q4H   insulin aspart  7 Units Subcutaneous Q4H   insulin glargine  15 Units Subcutaneous Daily   ipratropium  0.5 mg Nebulization Q6H   levalbuterol  0.63 mg Nebulization Q6H   mouth rinse  15 mL Mouth Rinse 10 times per day   metoprolol tartrate  12.5 mg Per Tube BID   multivitamin  1 tablet Per NG tube QHS   pantoprazole sodium  40 mg Per Tube BID   polyethylene glycol  17 g Per Tube Daily   predniSONE  10 mg Per Tube BID WC   sodium chloride flush  10-40 mL Intracatheter Q12H   Continuous Infusions:  sodium chloride Stopped (05/03/21 0705)   sodium chloride Stopped (05/03/21 1351)   cefTRIAXone (ROCEPHIN)  IV Stopped (05/06/21 0948)   feeding supplement (VITAL 1.5 CAL) 1,000 mL (05/05/21 2040)   heparin 1,050 Units/hr (05/06/21 1700)   prismasol BGK 2/2.5 replacement solution 500 mL/hr at 05/06/21 0048   prismasol BGK 2/2.5 replacement solution 500 mL/hr at 05/06/21 0046   prismasol BGK 4/2.5 1,500 mL/hr at 05/06/21 1809   PRN Meds:.Place/Maintain arterial line **AND** sodium chloride, acetaminophen, acetaminophen, fentaNYL (SUBLIMAZE) injection, glycopyrrolate, heparin, lip balm,  metoprolol tartrate, morphine injection, sodium chloride flush, sodium chloride HYPERTONIC  Current Labs: reviewed  6/16 UA w/o hematuria, trace protein 6/19 Renal US no HN  Physical Exam:  Blood pressure 108/88, pulse (!) 111, temperature 98.2 F (36.8 C), temperature source Axillary, resp. rate (!) 30, height 5' 2"  (1.575 m), weight 71.1 kg, SpO2 93 %. Intubated sedated, deconditioned Tachy IRIR Coarse bs b/l 1+ LE and UE edema S/nt/nd No rashes/lesions ETT in place, NCAT  Assessment/ Plan AKI - anuric, likely ATN in setting of #2 and #3. Given prolonged anuric state, progressive azotemia, and hypervolemia pt was started on CRRT 6/21. Had clotted catheter that responded to TPA dwell on 6/30. Pt extubated on 7/01 but remains very weak and poorly responsive. Pt is over-dialyzed, will give CRRT holiday starting tonight of 1-2 days. Also look for signs of recovery. Will follow.  Volume - under admit wts, was keeping +50 /hr due to concerns about intravasc vol depletion.  ARDS/CAP/VDRF: per CCM, improved ARDS. Extubated on 7/1. Pt is now a full DNR.  Hypotension - resolved, off pressors.  Giant Cell arteritis: chronic IS, now on stress dosing Anemia: transfusion per primary, for pRBC 6/22 [name] UTI: s/p course of cefepime AFib- on hep gtt DNR     Sol Blazing  05/06/2021, 6:15 PM  Recent Labs  Lab 05/05/21 1622 05/06/21 0510 05/06/21 1600  NA 138 137 137  K 3.7 3.4* 3.9  CL 103 101 101  CO2 27 26 26   GLUCOSE 180* 125* 130*  BUN 33* 32* 32*  CREATININE 0.60 0.75 0.71  CALCIUM 8.0* 7.9* 7.9*  PHOS 2.7 3.3 3.0    Recent Labs  Lab 05/04/21 0500 05/05/21 0432 05/06/21 0510 05/06/21 1600  WBC 12.5* 15.8* 14.3*  --   HGB 7.3* 8.0* 6.9* 8.3*  HCT 23.7* 25.6* 22.3* 26.4*  MCV 102.2* 103.6* 103.2*  --   PLT 199 224 194  --

## 2021-05-06 NOTE — Progress Notes (Signed)
[place] Progress Note Patient Name: Leslie Bryant DOB: 05-Sep-1941 MRN: [id number]   Date of Service  05/06/2021  HPI/Events of Note  Anemia - Hgb = 6.9.  eICU Interventions  Transfuse 1 unit PRBC now.      Intervention Category Major Interventions: Other:  Grisela Mesch Cornelia Copa 05/06/2021, 6:02 AM

## 2021-05-06 NOTE — Progress Notes (Signed)
[place] for Heparin Indication: atrial fibrillation  Allergies  Allergen Reactions   Crestor [Rosuvastatin Calcium] Other (See Comments)    Shoulder pain   Crestor [Rosuvastatin]     Other reaction(s): Unknown   Lipitor [Atorvastatin Calcium] Other (See Comments)    Myalgias    Pravachol [Pravastatin] Other (See Comments)    myalgias   Penicillins Itching    Has patient had a PCN reaction causing immediate rash, facial/tongue/throat swelling, SOB or lightheadedness with hypotension: No Has patient had a PCN reaction causing severe rash involving mucus membranes or skin necrosis: No Has patient had a PCN reaction that required hospitalization No Has patient had a PCN reaction occurring within the last 10 years: No If all of the above answers are "NO", then may proceed with Cephalosporin use.     Patient Measurements: Height: 5\' 2"  (157.5 cm) Weight: 71.1 kg (156 lb 12 oz) IBW/kg (Calculated) : 50.1 Heparin Dosing Weight: 67 kg  Vital Signs: Temp: 99 F (37.2 C) (07/03 0800) Temp Source: Axillary (07/03 0800) BP: 129/56 (07/03 0900) Pulse Rate: 115 (07/03 0900)  Labs: Recent Labs    05/04/21 0500 05/05/21 0432 05/05/21 0500 05/05/21 0530 05/05/21 1622 05/06/21 0510  HGB 7.3* 8.0*  --   --   --  6.9*  HCT 23.7* 25.6*  --   --   --  22.3*  PLT 199 224  --   --   --  194  HEPARINUNFRC 0.34  --  0.37  --   --  0.31  CREATININE 1.08*  --   --  0.75 0.60 0.75    Estimated Creatinine Clearance: 51.8 mL/min (by C-G formula based on SCr of 0.75 mg/dL).  Assessment: 71 yoF admit 6/16 with ShOB, DOE, recurrent falls, BLE edema. PMH includes Afib on apixaban.  Due to worsening renal function, she was changed to heparin per pharmacy on 6/19. She started CRRT on 6/21. Heparin was held 6/29 for R groin hematoma.  Significant Events: 6/30 OK to resume heparin per CCM as R groin hematoma stable w/o bleeding  Today, 05/06/2021:  HL =  0.31 is therapeutic on 1050 units/hr Hgb down to 6.9>1 U PRBC ordered; Plt improved to WNL Remains on CRRT No bleeding or line interruptions per RN  Goal of Therapy:  Heparin level 0.3-0.7 units/ml Monitor platelets by anticoagulation protocol: Yes   Plan:  Continue heparin infusion at 1050 units/hr Daily heparin level and CBC Monitor R groin hematoma F/u for resolution of AKI and ability to resume Eliquis   Eudelia Bunch, Pharm.D 05/06/2021 9:41 AM

## 2021-05-06 NOTE — Progress Notes (Signed)
NAME:  Leslie Bryant MRN:  [id number] DOB:  22-Jan-1941 LOS: 46 ADMISSION DATE:  04/25/2021 CONSULTATION DATE: 04/20/2021 REFERRING MD:  Alfredia Ferguson - [facility] CHIEF COMPLAINT:  SOB, hypoxic respiratory failure   Brief Narrative   80 year old female presented with presented to [facility] 6/16 via EMS for worsening SOB, hypoxia and concern for PNA.On ED evaluation patient was hypoxic on RA and in A-fib RVR .CXR demonsrated bilateral lower lobe infiltrates. Patient was admitted to [facility].  PCCM consulted 6/17 for ongoing hypoxemic respiratory failure despite supplemental O2.  Pertinent Medical History:  A-fib Right breast cancer  Bowel perforation  HLD  Significant Hospital Events:  6/16 Admitted to [facility] for SOB, hypoxia, multifocal PNA 6/17 PCCM consulted for persistent hypoxia, mild lethargy, ICU admission -> intubated later in day. CT chest with bilateral diffuse GGO.  Failed PICC line insertion 6/18 AM worsening AKI - creat 1.8. on vent 80% fio2, supnine. Sedated with fent gtt, versed gtt. On pressors neo. PCT high but culture negative so far. HS trop in 20s. RASS -4. On protoni and coffee ground mild returns noted this morning 6/18 PM in renal failure. Worsening creat. Dropping Ur op despite fluids. Bronch BAL -> 86% polys.  Alv  hge ruled out.  Elliquis now lower dose. Code changed to DNR + full medical care 6/19 renal consulted.  6/20 Remains sedated on vent minimally responsive on fentanyl drip with worsening renal failure, now off pressors 6/21 progressive multi - organ dysfunction including worsening renal failure, HD cath placed Right IJ. CRRT started.  mild liver injury with slight transaminitis, and continued encephalopathy. Cefepime completed.  6/22 amiodarone had to be restarted for af w/ RVR 6/23 starting Proning ->improved oxygenation and vent mechanics. Started low dose lopressor. Feeding tube placed post-pyloric  6/26 no longer requiring proning, FiO2 40% 6/27 tolerating weaning, down to  Fentanyl 29mg, off pressors and antibiotics, 6/28 placed back on fentanyl and Versed overnight, 1 out of 2 blood cultures growing staph epi, started on vancomycin and low-dose neo 6/29 continued issues with agitation and vent dyssynchrony overnight requiring as needed fentanyl 6/30 no acute events overnight required only minimal as needed fentanyl pushes for vent compliance.  Mentation continues to improve daily, currently able to follow simple commands but very deconditioned 7/1 DNR established, no reintubation if fails     Micro: Urine 6/16 with Klebsiella sensitive to ceph   ABX: Vanc 6/27 -6/29 Maxepimem 6/29-6/30 Rocephin 7/1 >>>    Scheduled Meds:  sodium chloride   Intravenous Once   amiodarone  200 mg Per Tube Daily   chlorhexidine gluconate (MEDLINE KIT)  15 mL Mouth Rinse BID   Chlorhexidine Gluconate Cloth  6 each Topical Daily   docusate  100 mg Per Tube Daily   feeding supplement (PROSource TF)  90 mL Per Tube QID   insulin aspart  0-15 Units Subcutaneous Q4H   insulin aspart  7 Units Subcutaneous Q4H   insulin glargine  15 Units Subcutaneous Daily   ipratropium  0.5 mg Nebulization Q6H   levalbuterol  0.63 mg Nebulization Q6H   mouth rinse  15 mL Mouth Rinse 10 times per day   metoprolol tartrate  12.5 mg Per Tube BID   multivitamin  1 tablet Per NG tube QHS   pantoprazole sodium  40 mg Per Tube BID   polyethylene glycol  17 g Per Tube Daily   predniSONE  10 mg Per Tube BID WC   sodium chloride flush  10-40 mL  Intracatheter Q12H   Continuous Infusions:  sodium chloride Stopped (05/03/21 0705)   sodium chloride Stopped (05/03/21 1351)   cefTRIAXone (ROCEPHIN)  IV Stopped (05/06/21 0948)   feeding supplement (VITAL 1.5 CAL) 1,000 mL (05/05/21 2040)   heparin 1,050 Units/hr (05/06/21 1200)   prismasol BGK 2/2.5 replacement solution 500 mL/hr at 05/06/21 0048   prismasol BGK 2/2.5 replacement solution 500 mL/hr at 05/06/21 0046   prismasol BGK 4/2.5 1,500  mL/hr at 05/06/21 0700   PRN Meds:.Place/Maintain arterial line **AND** sodium chloride, acetaminophen, acetaminophen, fentaNYL (SUBLIMAZE) injection, glycopyrrolate, heparin, lip balm, metoprolol tartrate, morphine injection, sodium chloride flush, sodium chloride HYPERTONIC     Interim History / Subjective:  Less alert today, some gurgling on voluntary cough   Objective:  Blood pressure (!) 113/41, pulse (!) 103, temperature 99.4 F (37.4 C), resp. rate (!) 33, height _0  (1.575 m), weight 71.1 kg, SpO2 95 %. CVP:  [2 mmHg-9 mmHg] 9 mmHg      Intake/Output Summary (Last 24 hours) at 05/06/2021 1258 Last data filed at 05/06/2021 1200 Gross per 24 hour  Intake 1741.31 ml  Output 662 ml  Net 1079.31 ml   Filed Weights   05/04/21 0400 05/05/21 0500 05/06/21 0500  Weight: 74.5 kg 69.6 kg 71.1 kg   Physical Exam: Tmax  99.8  General appearance:    very debilitated appearing  At Rest 02 sats  98% on 6lpm  No jvd Oropharynx clear,  mucosa nl Neck supple Lungs with a few scattered exp > insp rhonchi bilaterally RRR no s3 or or sign murmur Abd obese with limited excursion  Extr warm with no edema or clubbing noted Neuro  Sensorium sleepy but arouses to verbal and f/c,  no apparent motor deficits      Resolved Hospital Problem List:   Septic shock - Pressors stopped 6/19 Klebsiella UTI, POA ARDS CAP Hypoglycemia  Transaminitis   Assessment & Plan:   Acute Hypoxic Respiratory Failure Secondary to CAP and ARDS -resolved  Concern for evolving pneumonia. Intubated on 6/17, required intermittent prone positioning. Completed course of  Cefepime/Ceftriaxone on 6/21 Patient began developing low-grade fever 6/25, repeat cultures 6/28.  -wean O2 for sats >90% -pulmonary hygiene - IS, mobilize  -PAD protocol  -no reintubation  -continue ceftriaxone   Acute metabolic encephalopathy   History of giant cell arteritis on chronic steroids Prednisone held 6/17, had been on high  dose for several months. Likely secondary to prolonged sedation -minimize sedating medications -promote sleep / wake cycle -PT efforts as able   Atrial fibrillation on amiodarone, HFpEF New onset right ventricular failure on admission. Preserved EF on echo 6/17 -heparin gtt restarted 6/30, per pharmacy  -follow Hgb trend, monitor for bleeding  -tele monitoring  -continue lopressor, reduce dose to 12.5 BID with soft pressures / fluid bolus needed 7/2 am -continue amiodarone     Acute kidney injury (POA) Oliguric with renal failure  -Trend BMP / urinary output -Replace electrolytes as indicated -Avoid nephrotoxic agents, ensure adequate renal perfusion - changed to qod CVVH as of 7/3   Anemia   Lab Results  Component Value Date   HGB 6.9 (LL) 05/06/2021   HGB 8.0 (L) 05/05/2021   HGB 7.3 (L) 05/04/2021   HGB 7.7 (L) 04/22/2021   HGB 8.6 (L) 04/21/2021   HGB 12.1 09/27/2020   HGB 12.0 07/08/2013   HGB 10.9 (L) 06/09/2013    Due to sepsis, blood loss with thigh hematoma, consumption from circuit.  No signs of  active bleeding. -follow CBC  -transfuse per protocol  -hgb goal >7% >>> transfuse  one unit 7/3   RLE Hematoma of the proximal thigh Developed after A-line removal -supportive care, monitor site    Best Practice   Diet/type: tubefeeds   DVT prophylaxis: systemic heparin GI prophylaxis: PPI Glucose control:  SSI and Basal coverage  Central venous access:  Yes, and it is still needed   Arterial line:  N/A Foley:  N/A Mobility:  bed rest  Code Status:  DNR Last date of multidisciplinary goals of care discussion:  7/1, see note. DNR / no reintubation if declines.   ccm prognosis: Life-threating Disposition: remains critically ill, will stay in intensive care   Christinia Gully, MD Pulmonary and [place] [phone]   After 7:00 pm call Elink  [phone]

## 2021-05-07 ENCOUNTER — Inpatient Hospital Stay (HOSPITAL_COMMUNITY): Payer: Medicare Other

## 2021-05-07 DIAGNOSIS — R0603 Acute respiratory distress: Secondary | ICD-10-CM

## 2021-05-07 DIAGNOSIS — Z7189 Other specified counseling: Secondary | ICD-10-CM

## 2021-05-07 DIAGNOSIS — Z515 Encounter for palliative care: Secondary | ICD-10-CM

## 2021-05-07 DIAGNOSIS — R0602 Shortness of breath: Secondary | ICD-10-CM

## 2021-05-07 DIAGNOSIS — J189 Pneumonia, unspecified organism: Secondary | ICD-10-CM

## 2021-05-07 LAB — BPAM RBC
Blood Product Expiration Date: 202208042359
ISSUE DATE / TIME: 202207031045
Unit Type and Rh: 5100

## 2021-05-07 LAB — TYPE AND SCREEN
ABO/RH(D): O POS
Antibody Screen: NEGATIVE
Unit division: 0

## 2021-05-07 LAB — CBC
HCT: 26.1 % — ABNORMAL LOW (ref 36.0–46.0)
Hemoglobin: 8.2 g/dL — ABNORMAL LOW (ref 12.0–15.0)
MCH: 31.7 pg (ref 26.0–34.0)
MCHC: 31.4 g/dL (ref 30.0–36.0)
MCV: 100.8 fL — ABNORMAL HIGH (ref 80.0–100.0)
Platelets: 187 10*3/uL (ref 150–400)
RBC: 2.59 MIL/uL — ABNORMAL LOW (ref 3.87–5.11)
RDW: 17.3 % — ABNORMAL HIGH (ref 11.5–15.5)
WBC: 13 10*3/uL — ABNORMAL HIGH (ref 4.0–10.5)
nRBC: 6.6 % — ABNORMAL HIGH (ref 0.0–0.2)

## 2021-05-07 LAB — GLUCOSE, CAPILLARY
Glucose-Capillary: 197 mg/dL — ABNORMAL HIGH (ref 70–99)
Glucose-Capillary: 160 mg/dL — ABNORMAL HIGH (ref 70–99)

## 2021-05-07 LAB — HEPARIN LEVEL (UNFRACTIONATED): Heparin Unfractionated: 0.24 IU/mL — ABNORMAL LOW (ref 0.30–0.70)

## 2021-05-07 MED ORDER — HALOPERIDOL LACTATE 5 MG/ML IJ SOLN: 1.0000 mg | INTRAMUSCULAR | Status: DC | PRN

## 2021-05-07 MED ORDER — BIOTENE DRY MOUTH MT LIQD: 15.0000 mL | OROMUCOSAL | Status: DC | PRN

## 2021-05-07 MED ORDER — MORPHINE SULFATE (PF) 2 MG/ML IV SOLN
INTRAVENOUS | Status: AC
  Administered 2021-05-07: 5 mg via INTRAVENOUS
  Filled 2021-05-07: qty 3

## 2021-05-07 MED ORDER — HEPARIN (PORCINE) 25000 UT/250ML-% IV SOLN: 1150.0000 [IU]/h | INTRAVENOUS | Status: DC

## 2021-05-07 MED ORDER — HYDROMORPHONE HCL 1 MG/ML IJ SOLN
1.0000 mg | INTRAMUSCULAR | Status: DC | PRN
  Administered 2021-05-07 – 2021-05-08 (×8): 1 mg via INTRAVENOUS
  Filled 2021-05-07 (×8): qty 1

## 2021-05-07 MED ORDER — MORPHINE SULFATE (PF) 4 MG/ML IV SOLN: 5.0000 mg | INTRAVENOUS | Status: DC | PRN

## 2021-05-07 MED ORDER — ACETAMINOPHEN 160 MG/5ML PO SOLN
650.0000 mg | Freq: Four times a day (QID) | ORAL | Status: DC | PRN
  Administered 2021-05-07: 650 mg
  Filled 2021-05-07: qty 20.3

## 2021-05-07 MED ORDER — LORAZEPAM 2 MG/ML PO CONC: 1.0000 mg | ORAL | Status: DC | PRN

## 2021-05-07 MED ORDER — ONDANSETRON 4 MG PO TBDP: 4.0000 mg | ORAL_TABLET | Freq: Four times a day (QID) | ORAL | Status: DC | PRN

## 2021-05-07 MED ORDER — LORAZEPAM 1 MG PO TABS: 1.0000 mg | ORAL_TABLET | ORAL | Status: DC | PRN

## 2021-05-07 MED ORDER — LORAZEPAM 2 MG/ML IJ SOLN
1.0000 mg | INTRAMUSCULAR | Status: DC | PRN
  Administered 2021-05-07 – 2021-05-08 (×3): 1 mg via INTRAVENOUS
  Filled 2021-05-07 (×3): qty 1

## 2021-05-07 MED ORDER — HALOPERIDOL LACTATE 2 MG/ML PO CONC
0.5000 mg | ORAL | Status: DC | PRN
  Filled 2021-05-07: qty 0.3

## 2021-05-07 MED ORDER — HYDROMORPHONE HCL 1 MG/ML IJ SOLN
INTRAMUSCULAR | Status: AC
  Administered 2021-05-07: 1 mg via INTRAVENOUS
  Filled 2021-05-07: qty 1

## 2021-05-07 MED ORDER — ONDANSETRON HCL 4 MG/2ML IJ SOLN: 4.0000 mg | Freq: Four times a day (QID) | INTRAMUSCULAR | Status: DC | PRN

## 2021-05-07 MED ORDER — HALOPERIDOL 1 MG PO TABS: 0.5000 mg | ORAL_TABLET | ORAL | Status: DC | PRN

## 2021-05-07 MED ORDER — POLYVINYL ALCOHOL 1.4 % OP SOLN
1.0000 [drp] | Freq: Four times a day (QID) | OPHTHALMIC | Status: DC | PRN
  Filled 2021-05-07: qty 15

## 2021-05-07 NOTE — Progress Notes (Signed)
Pt work of breathing has increased and so has her heart rate has been sustaining in the 130s. E-link was notified. Morphine 2 mg was given for comfort. Husband was called and updated by this nurse. All parties agreed for the husband to come up to be with patient.

## 2021-05-07 NOTE — Progress Notes (Signed)
[place] for Heparin Indication: atrial fibrillation  Allergies  Allergen Reactions   Crestor [Rosuvastatin Calcium] Other (See Comments)    Shoulder pain   Crestor [Rosuvastatin]     Other reaction(s): Unknown   Lipitor [Atorvastatin Calcium] Other (See Comments)    Myalgias    Pravachol [Pravastatin] Other (See Comments)    myalgias   Penicillins Itching    Has patient had a PCN reaction causing immediate rash, facial/tongue/throat swelling, SOB or lightheadedness with hypotension: No Has patient had a PCN reaction causing severe rash involving mucus membranes or skin necrosis: No Has patient had a PCN reaction that required hospitalization No Has patient had a PCN reaction occurring within the last 10 years: No If all of the above answers are "NO", then may proceed with Cephalosporin use.     Patient Measurements: Height: 5\' 2"  (157.5 cm) Weight: 71.1 kg (156 lb 12 oz) IBW/kg (Calculated) : 50.1 Heparin Dosing Weight: 67 kg  Vital Signs: Temp: 99.1 F (37.3 C) (07/03 1900) Temp Source: Oral (07/03 1900) BP: 128/41 (07/04 0400) Pulse Rate: 135 (07/04 0400)  Labs: Recent Labs    05/05/21 0432 05/05/21 0500 05/05/21 0530 05/05/21 1622 05/06/21 0510 05/06/21 1600 05/07/21 0430  HGB 8.0*  --   --   --  6.9* 8.3* 8.2*  HCT 25.6*  --   --   --  22.3* 26.4* 26.1*  PLT 224  --   --   --  194  --  187  HEPARINUNFRC  --  0.37  --   --  0.31  --  0.24*  CREATININE  --   --    < > 0.60 0.75 0.71  --    < > = values in this interval not displayed.    Estimated Creatinine Clearance: 51.8 mL/min (by C-G formula based on SCr of 0.71 mg/dL).  Assessment: 27 yoF admit 6/16 with ShOB, DOE, recurrent falls, BLE edema. PMH includes Afib on apixaban.  Due to worsening renal function, she was changed to heparin per pharmacy on 6/19. She started CRRT on 6/21. Heparin was held 6/29 for R groin hematoma.  Significant Events: 6/30 OK to  resume heparin per CCM as R groin hematoma stable w/o bleeding  Today, 05/07/2021:  HL = 0.24 is subtherapeutic on 1050 units/hr Hgb 8.2 ; Plt WNL Remains on CRRT No bleeding or line interruptions noted  Goal of Therapy:  Heparin level 0.3-0.7 units/ml Monitor platelets by anticoagulation protocol: Yes   Plan:  Increase heparin infusion to 1150 units/hr Check heparin level 8 hr after rate increase Daily heparin level and CBC Monitor R groin hematoma F/u for resolution of AKI and ability to resume Eliquis   Leone Haven, PharmD 05/07/2021 5:23 AM

## 2021-05-07 NOTE — TOC Progression Note (Signed)
Transition of Care [facility]) - Progression Note    Patient Details  Name: Leslie Bryant MRN: [id number] Date of Birth: [birth year]  Transition of Care [facility]) CM/SW Contact  Leeroy Cha, RN Phone Number: 05/07/2021, 7:57 AM  Clinical Narrative:    [place] Hospital Events:  6/16 Admitted to [facility] for SOB, hypoxia, multifocal PNA 6/17 PCCM consulted for persistent hypoxia, mild lethargy, ICU admission -> intubated later in day. CT chest with bilateral diffuse GGO.  Failed PICC line insertion 6/18 AM worsening AKI - creat 1.8. on vent 80% fio2, supnine. Sedated with fent gtt, versed gtt. On pressors neo. PCT high but culture negative so far. HS trop in 20s. RASS -4. On protoni and coffee ground mild returns noted this morning 6/18 PM in renal failure. Worsening creat. Dropping Ur op despite fluids. Bronch BAL -> 86% polys.  Alv  hge ruled out.  Elliquis now lower dose. Code changed to DNR + full medical care 6/19 renal consulted. 6/20 Remains sedated on vent minimally responsive on fentanyl drip with worsening renal failure, now off pressors 6/21 progressive multi - organ dysfunction including worsening renal failure, HD cath placed Right IJ. CRRT started.  mild liver injury with slight transaminitis, and continued encephalopathy. Cefepime completed. 6/22 amiodarone had to be restarted for af w/ RVR 6/23 starting Proning ->improved oxygenation and vent mechanics. Started low dose lopressor. Feeding tube placed post-pyloric 6/26 no longer requiring proning, FiO2 40% 6/27 tolerating weaning, down to Fentanyl 26mg, off pressors and antibiotics, 6/28 placed back on fentanyl and Versed overnight, 1 out of 2 blood cultures growing staph epi, started on vancomycin and low-dose neo 6/29 continued issues with agitation and vent dyssynchrony overnight requiring as needed fentanyl 6/30 no acute events overnight required only minimal as needed fentanyl pushes for vent compliance.  Mentation continues to  improve daily, currently able to follow simple commands but very deconditioned 7/1 DNR established, no reintubation if fails   TOC PLAN OF CARE: Following for progression.  Patient is now extubated and dnr.  May need snf if she survives or possible residential hospice.   [place]: Continued Medical Work up  Expected Discharge Plan and Services Expected Discharge Plan: [place]  Discharge Planning Services: CM Consult   Living arrangements for the past 2 months: Single Family Home                                       Social Determinants of Health (SDOH) Interventions    Readmission Risk Interventions No flowsheet data found.

## 2021-05-07 NOTE — Consult Note (Signed)
Consultation Note Date: 05/07/2021   Patient Name: Leslie Bryant  DOB: August 19, 1941  MRN: [id number]  Age / Sex: 80 y.o., female  PCP: Biagio Borg, MD Referring Physician: Tanda Rockers, MD  Reason for Consultation: Establishing goals of care and Terminal Care  HPI/Patient Profile: 79 y.o. female  with past medical history of a fib, breast cancer, HLD, bowel perforation admitted on 04/27/2021 with multifocal PNA.  She has had complicated hospital course including AKI requiring CRRT, respiratory failure requiring intubation, liver injury, and a fib.  She was extubated with plan for no reintubation.  Palliative consulted as she has worsened and family wanting to focus on comfort moving forward.   Clinical Assessment and Goals of Care: I met today with patient's husband and son.  I then discussed with her daughter when she returned to the room.  I introduced palliative care as specialized medical care for people living with serious illness. It focuses on providing relief from the symptoms and stress of a serious illness. The goal is to improve quality of life for both the patient and the family.  We discussed clinical course as well as wishes moving forward in light of her continued decline.  Family at bedside report that she has been uncomfortable and they worry about her suffering.  We discussed difference between a aggressive medical intervention path and a palliative, comfort focused care path.    Family is clear that goal moving forward is to focus on comfort.  They feel that she is suffering and want her to die with as much peace and dignity as possible.   Questions and concerns addressed.   PMT will continue to support holistically.  SUMMARY OF RECOMMENDATIONS   - DNR/DNI - Full comfort as primary goal moving forward. - Unrestricted visitation. - Pain/shortness of breath: She responded very well to trial  dose of dilaudid.  Continue dilaudid as needed.  Low threshold to start continuous infusion if needed to ensure her comfort. - Anxiety: Ativan as needed - Agitation: Haldol as needed - Excess secretions: Robinul as needed  Code Status/Advance Care Planning: DNR  Palliative Prophylaxis:  Frequent Pain Assessment  Additional Recommendations (Limitations, Scope, Preferences): Full Comfort Care  Prognosis:  Hours - Days  Discharge Planning: Anticipated Hospital Death      Primary Diagnoses: Present on Admission:  CAP (community acquired pneumonia)  Essential hypertension  Acute hypoxemic respiratory failure (HCC)  Hyponatremia  Atrial fibrillation, rapid ([place])  Temporal arteritis ([place])   I have reviewed the medical record, interviewed the patient and family, and examined the patient. The following aspects are pertinent.  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  Resolved by CT scan Recurrent episode clinical dx 07/2012    Diverticulosis of colon    sigmoid   GERD (gastroesophageal reflux disease)    H/O hiatal hernia    Headache    History of kidney stones    "passed it" (02/19/2017)   Hypercholesterolemia    history   Left knee pain    occasionally   Palpitations IRREGULAR HEARTBEAT--  CONTROLLED W/  BETA BLOCKER   Pneumonia 2017   "walking pneumonia" (02/19/2017)   Swelling of left knee joint    Social History   Socioeconomic History   Marital status: Married    Spouse name: Not on file   Number of children: 2   Years of education: Not on file   Highest education level: Not on file  Occupational History   Occupation:  Retired  Tobacco Use   Smoking status: Never   Smokeless tobacco: Never  Vaping Use   Vaping Use: Never used  Substance and Sexual Activity   Alcohol use: Not Currently    Alcohol/week: 1.0 standard drink    Types: 1 Glasses of wine per week   Drug use: No   Sexual activity: Yes    Birth control/protection: Surgical  Other Topics Concern   Not on file  Social History Narrative   She is married and retired and has 2 grown children   1 glass of wine a month no tobacco no drug use   Social Determinants of Radio broadcast assistant Strain: Not on file  Food Insecurity: Not on file  Transportation Needs: Not on file  Physical Activity: Not on file  Stress: Not on file  Social Connections: Not on file   Family History  Problem Relation Age of Onset   Heart attack Father    Heart failure Mother        CHF   Dementia Maternal Grandfather    Dementia Sister    Colon cancer Brother        died/age 33   Esophageal cancer Brother    Lung cancer Sister    Brain cancer Sister    Cancer Brother        mouth   Rectal cancer Brother    Leukemia Brother    Cancer Sister        type unknown   Cancer Sister        type unknown   Heart disease Daughter    Stomach cancer Neg Hx    Scheduled Meds:  chlorhexidine gluconate (MEDLINE KIT)  15 mL Mouth Rinse BID   ipratropium  0.5 mg Nebulization Q6H   levalbuterol  0.63 mg Nebulization Q6H   mouth rinse  15 mL Mouth Rinse 10 times per day   sodium chloride flush  10-40 mL Intracatheter Q12H   Continuous Infusions:  sodium chloride Stopped (05/03/21 1351)   PRN Meds:.acetaminophen (TYLENOL) oral liquid 160 mg/5 mL, acetaminophen, antiseptic oral rinse, glycopyrrolate, haloperidol **OR** haloperidol **OR** haloperidol lactate, HYDROmorphone (DILAUDID) injection, lip balm, LORazepam **OR** LORazepam **OR** LORazepam, ondansetron **OR** ondansetron (ZOFRAN) IV, polyvinyl alcohol, sodium chloride flush, sodium chloride  HYPERTONIC Medications Prior to Admission:  Prior to Admission medications   Medication Sig Start Date End Date Taking? Authorizing Provider  alendronate (FOSAMAX) 70 MG tablet Take 70 mg by mouth once a week. 01/30/21  Yes [provider]  amiodarone (PACERONE) 200 MG tablet Take 1 tablet (200 mg total) by mouth daily. 06/19/20  Yes Deboraha Sprang,  MD  calcium carbonate (OS-CAL - DOSED IN MG OF ELEMENTAL CALCIUM) 1250 (500 Ca) MG tablet Take 1 tablet by mouth daily with breakfast.   Yes [provider]  co-enzyme Q-10 30 MG capsule Take 30 mg by mouth daily.   Yes [provider]  ELIQUIS 5 MG TABS tablet Take 1 tablet by mouth twice daily Patient taking differently: Take 5 mg by mouth 2 (two) times daily. 02/12/21  Yes Hilty, Nadean Corwin, MD  ezetimibe (ZETIA) 10 MG tablet Take 1 tablet by mouth once daily Patient taking differently: Take 10 mg by mouth daily. 02/01/21  Yes Deboraha Sprang, MD  famotidine (PEPCID) 40 MG tablet TAKE 1 TABLET BY MOUTH AT BEDTIME Patient taking differently: Take 40 mg by mouth daily. 03/22/21  Yes Biagio Borg, MD  furosemide (LASIX) 40 MG tablet Take 1 tablet by mouth once daily Patient taking differently: Take 40 mg by mouth daily. 03/05/21  Yes Hilty, Nadean Corwin, MD  metoprolol tartrate (LOPRESSOR) 50 MG tablet TAKE 1 & 1/2 (ONE & ONE-HALF) TABLETS BY MOUTH TWICE DAILY Patient taking differently: Take 75 mg by mouth 2 (two) times daily. 11/29/20  Yes Deboraha Sprang, MD  Multiple Vitamins-Minerals (CENTRUM SILVER PO) Take 1 tablet by mouth every morning.   Yes [provider]  omeprazole (PRILOSEC) 40 MG capsule TAKE 1 CAPSULE BY MOUTH TWICE DAILY BEFORE MEAL(S) Patient taking differently: Take 40 mg by mouth daily. 08/01/20  Yes Almyra Deforest, PA  predniSONE (DELTASONE) 10 MG tablet Take 10 mg by mouth 2 (two) times daily with a meal. 04/10/21  Yes [provider]  ACTEMRA 162 MG/0.9ML SOSY Inject 162 mg into the skin every 14  (fourteen) days. 04/06/21   [provider]  hyoscyamine (LEVSIN SL) 0.125 MG SL tablet DISSOLVE 1 TABLET IN MOUTH THREE TIMES DAILY AS NEEDED FOR ABDOMINAL CRAMPS 12/22/20 04/12/2021  Gatha Mayer, MD  ipratropium (ATROVENT) 0.06 % nasal spray as needed. 11/07/20 04/12/2021  [provider]  zolpidem Lorrin Mais) 5 MG tablet 1-2 tab by mouth at bedtime as needed 02/20/21 04/21/2021  Biagio Borg, MD   Allergies  Allergen Reactions   Crestor [Rosuvastatin Calcium] Other (See Comments)    Shoulder pain   Crestor [Rosuvastatin]     Other reaction(s): Unknown   Lipitor [Atorvastatin Calcium] Other (See Comments)    Myalgias    Pravachol [Pravastatin] Other (See Comments)    myalgias   Penicillins Itching    Has patient had a PCN reaction causing immediate rash, facial/tongue/throat swelling, SOB or lightheadedness with hypotension: No Has patient had a PCN reaction causing severe rash involving mucus membranes or skin necrosis: No Has patient had a PCN reaction that required hospitalization No Has patient had a PCN reaction occurring within the last 10 years: No If all of the above answers are "NO", then may proceed with Cephalosporin use.    Review of Systems Unable to obtain  Physical Exam General: Significant work of breathing, uncomfortable Heart: Tachycardic Lungs: Poor air movement, Coarse Abdomen: Soft, nontender, nondistended, positive bowel sounds.   Ext: No significant edema  Vital Signs: BP 109/60   Pulse (!) 133   Temp (!) 100.8 F (38.2 C) (Axillary)   Resp (!) 40   Ht 5' 2"  (1.575 m)   Wt 71.1 kg   SpO2 95%   BMI 28.67 kg/m  Pain Scale: CPOT   Pain Score: Asleep   SpO2: SpO2: 95 % O2 Device:SpO2: 95 % O2 Flow  Rate: .O2 Flow Rate (L/min): 6 L/min  IO: Intake/output summary:  Intake/Output Summary (Last 24 hours) at 05/07/2021 1036 Last data filed at 05/07/2021 0400 Gross per 24 hour  Intake 1338.77 ml  Output 214 ml  Net 1124.77 ml    LBM: Last  BM Date: 05/07/21 Baseline Weight: Weight: 77.1 kg Most recent weight: Weight: 71.1 kg     Palliative Assessment/Data:   Flowsheet Rows    Flowsheet Row Most Recent Value  Intake Tab   Referral Department Critical care  Unit at Time of Referral ICU  Palliative Care Primary Diagnosis Sepsis/Infectious Disease  Date Notified 05/06/21  Palliative Care Type New Palliative care  Reason for referral Clarify Goals of Care, End of Life Care Assistance  Date of Admission 05/02/2021  Date first seen by Palliative Care 05/07/21  # of days Palliative referral response time 1 Day(s)  # of days IP prior to Palliative referral 17  Clinical Assessment   Palliative Performance Scale Score 10%  Psychosocial & Spiritual Assessment   Palliative Care Outcomes   Patient/Family meeting held? Yes  Who was at the meeting? Husband, daughter, son       Time In: 0900 Time Out: 51 Time Total: 86 Greater than 50%  of this time was spent counseling and coordinating care related to the above assessment and plan.  Signed by: Micheline Rough, MD   Please contact Palliative Medicine Team phone at [phone] for questions and concerns.  For individual provider: See Shea Evans

## 2021-05-07 NOTE — Progress Notes (Signed)
RT held CPT due to patients increase work of breathing

## 2021-05-07 NOTE — Progress Notes (Signed)
NAME:  Leslie Bryant MRN:  [id number] DOB:  12/07/1940 LOS: 70 ADMISSION DATE:  04/07/2021 CONSULTATION DATE: 04/20/2021 REFERRING MD:  Alfredia Ferguson - TRH CHIEF COMPLAINT:  SOB, hypoxic respiratory failure   Brief Narrative   80 year old female presented with presented to [facility] 6/16 via EMS for worsening SOB, hypoxia and concern for PNA.On ED evaluation patient was hypoxic on RA and in A-fib RVR .CXR demonsrated bilateral lower lobe infiltrates. Patient was admitted to [facility].  PCCM consulted 6/17 for ongoing hypoxemic respiratory failure despite supplemental O2.  Pertinent Medical History:  A-fib Right breast cancer  Bowel perforation  HLD  Significant Hospital Events:  6/16 Admitted to [facility] for SOB, hypoxia, multifocal PNA 6/17 PCCM consulted for persistent hypoxia, mild lethargy, ICU admission -> intubated later in day. CT chest with bilateral diffuse GGO.  Failed PICC line insertion 6/18 AM worsening AKI - creat 1.8. on vent 80% fio2, supnine. Sedated with fent gtt, versed gtt. On pressors neo. PCT high but culture negative so far. HS trop in 20s. RASS -4. On protoni and coffee ground mild returns noted this morning 6/18 PM in renal failure. Worsening creat. Dropping Ur op despite fluids. Bronch BAL -> 86% polys.  Alv  hge ruled out.  Elliquis now lower dose. Code changed to DNR + full medical care 6/19 renal consulted.  6/20 Remains sedated on vent minimally responsive on fentanyl drip with worsening renal failure, now off pressors 6/21 progressive multi - organ dysfunction including worsening renal failure, HD cath placed Right IJ. CRRT started.  mild liver injury with slight transaminitis, and continued encephalopathy. Cefepime completed.  6/22 amiodarone had to be restarted for af w/ RVR 6/23 starting Proning ->improved oxygenation and vent mechanics. Started low dose lopressor. Feeding tube placed post-pyloric  6/26 no longer requiring proning, FiO2 40% 6/27 tolerating weaning, down to  Fentanyl 17mg, off pressors and antibiotics, 6/28 placed back on fentanyl and Versed overnight, 1 out of 2 blood cultures growing staph epi, started on vancomycin and low-dose neo 6/29 continued issues with agitation and vent dyssynchrony overnight requiring as needed fentanyl 6/30 no acute events overnight required only minimal as needed fentanyl pushes for vent compliance.  Mentation continues to improve daily, currently able to follow simple commands but very deconditioned 7/1 DNR established, no reintubation if fails   7/4 transition to comfort care    Micro: Urine 6/16 with Klebsiella sensitive to ceph   ABX: Vanc 6/27 -6/29 Maxepimem 6/29-6/30 Rocephin 7/1 >>>    Scheduled Meds:  sodium chloride   Intravenous Once   amiodarone  200 mg Per Tube Daily   chlorhexidine gluconate (MEDLINE KIT)  15 mL Mouth Rinse BID   Chlorhexidine Gluconate Cloth  6 each Topical Daily   docusate  100 mg Per Tube Daily   feeding supplement (PROSource TF)  90 mL Per Tube QID   insulin aspart  0-15 Units Subcutaneous Q4H   insulin aspart  7 Units Subcutaneous Q4H   insulin glargine  15 Units Subcutaneous Daily   ipratropium  0.5 mg Nebulization Q6H   levalbuterol  0.63 mg Nebulization Q6H   mouth rinse  15 mL Mouth Rinse 10 times per day   metoprolol tartrate  12.5 mg Per Tube BID   morphine       multivitamin  1 tablet Per NG tube QHS   pantoprazole sodium  40 mg Per Tube BID   polyethylene glycol  17 g Per Tube Daily   predniSONE  10  mg Per Tube BID WC   sodium chloride flush  10-40 mL Intracatheter Q12H   Continuous Infusions:  sodium chloride Stopped (05/03/21 0705)   sodium chloride Stopped (05/03/21 1351)   cefTRIAXone (ROCEPHIN)  IV Stopped (05/06/21 0948)   feeding supplement (VITAL 1.5 CAL) 1,000 mL (05/05/21 2040)   heparin 1,150 Units/hr (05/07/21 0522)   PRN Meds:.Place/Maintain arterial line **AND** sodium chloride, acetaminophen (TYLENOL) oral liquid 160 mg/5 mL,  acetaminophen, glycopyrrolate, lip balm, metoprolol tartrate, morphine injection, sodium chloride flush, sodium chloride HYPERTONIC     Interim History / Subjective:   Much worse overnight, not able to clear secretions/ higher wob and fm at bedside with rec to transition to comfort care as remains full NCB/no escalation status  Objective:  Blood pressure 109/60, pulse (!) 133, temperature (!) 100.8 F (38.2 C), temperature source Axillary, resp. rate (!) 40, height _0  (1.575 m), weight 71.1 kg, SpO2 95 %.        Intake/Output Summary (Last 24 hours) at 05/07/2021 0815 Last data filed at 05/07/2021 0400 Gross per 24 hour  Intake 1649.73 ml  Output 214 ml  Net 1435.73 ml   Filed Weights   05/04/21 0400 05/05/21 0500 05/06/21 0500  Weight: 74.5 kg 69.6 kg 71.1 kg   Physical Exam: Tmax   99.4  General appearance:    slumped over forward in 45 degree HOB elevation with high wob  At Rest 02 sats  92% on 6lpm  > ventimask now in place Neck supple Lungs with insp/rexp coarse  rhonchi bilaterally and still not able to clear secretions RRR no s3  ST on monitor Abd soft/ limited  excursion  Extr warm with no edema or clubbing noted Neuro  Sensorium stuporous,  no apparent motor deficits    I personally reviewed images and agree with radiology impression as follows:  CXR:   portable 7/4   AS dz R > L      Resolved Hospital Problem List:   Septic shock - Pressors stopped 6/19 Klebsiella UTI, POA ARDS CAP Hypoglycemia  Transaminitis   Assessment & Plan:   Acute Hypoxic Respiratory Failure Secondary to CAP and ARDS   Concern for evolving Hosp acquired  pneumonia. Intubated on 6/17, required intermittent prone positioning. Completed course of  Cefepime/Ceftriaxone on 6/21   -no reintubation  D/c ceftriaxone as moving to comfort care   Acute metabolic encephalopathy   History of giant cell arteritis on chronic steroids Prednisone held 6/17, had been on high dose for several  months. Likely secondary to prolonged sedation    Atrial fibrillation on amiodarone, HFpEF New onset right ventricular failure on admission. Preserved EF on echo 6/17 -heparin gtt restarted 6/30, per pharmacy   -continue lopressor, reduced dose to 12.5 BID with soft pressures   -continue amiodarone     Acute kidney injury (POA) Oliguric with renal failure  -Trend BMP / urinary output -Replace electrolytes as indicated -Avoid nephrotoxic agents, ensure adequate renal perfusion - changed to qod CVVH as of 7/3   Anemia   Lab Results  Component Value Date   HGB 8.2 (L) 05/07/2021   HGB 8.3 (L) 05/06/2021   HGB 6.9 (LL) 05/06/2021   HGB 7.7 (L) 04/22/2021   HGB 8.6 (L) 04/21/2021   HGB 12.1 09/27/2020   HGB 12.0 07/08/2013   HGB 10.9 (L) 06/09/2013    Due to sepsis, blood loss with thigh hematoma, consumption from circuit.  No signs of active bleeding.   >>> transfused  one  unit 7/3   RLE Hematoma of the proximal thigh Developed after A-line removal -supportive care, monitor site    Best Practice   Diet/type: tubefeeds  DVT prophylaxis: systemic heparin GI prophylaxis: PPI Glucose control:  SSI and Basal coverage  Central venous access:  Yes, and it is still needed   Arterial line:  N/A Foley:  N/A Mobility:  bed rest  Code Status:  DNR Last date of multidisciplinary goals of care discussion:  7/1, see note. DNR / no reintubation if declines.   ccm prognosis: Life-threating Disposition: remains critically ill, will stay in intensive care  Fm at bedside agreeable to comfort measures/ no escalation of care  Formal palliative care involvement appreciated and ok with me to d/c all meds that aren't comfort relayed.   The patient is critically ill with multiple organ systems failure and requires high complexity decision making for assessment and support, frequent evaluation and titration of therapies, application of advanced monitoring technologies and extensive  interpretation of multiple databases. Critical Care Time devoted to patient care services described in this note is 40 minutes.   Christinia Gully, MD Pulmonary and [place] [phone]   After 7:00 pm call Elink  [phone]

## 2021-05-08 ENCOUNTER — Telehealth: Payer: Self-pay | Admitting: Internal Medicine

## 2021-05-08 MED ORDER — LORAZEPAM 2 MG/ML PO CONC: 1.0000 mg | ORAL | Status: DC | PRN

## 2021-05-08 MED ORDER — LORAZEPAM 1 MG PO TABS: 1.0000 mg | ORAL_TABLET | ORAL | Status: DC | PRN

## 2021-05-08 MED ORDER — LORAZEPAM 2 MG/ML IJ SOLN: 1.0000 mg | INTRAMUSCULAR | Status: DC | PRN

## 2021-05-25 LAB — FUNGUS CULTURE WITH STAIN

## 2021-05-25 LAB — FUNGAL ORGANISM REFLEX

## 2021-05-25 LAB — FUNGUS CULTURE RESULT

## 2021-06-04 NOTE — Progress Notes (Signed)
[place] visited pt. and family as follow-up from prior visit and to assess family needs as pt. is transitioned to comfort care.  RN shared family's pastor was here earlier today and that family had been made aware of chaplains' availability; [place] made brief visit w/two family members at bedside who expressed gratitude for support but share no needs at this time.  Chaplains remain available.  Lindaann Pascal PRN Chaplain Pager: [phone]

## 2021-06-04 NOTE — Death Summary Note (Signed)
DEATH SUMMARY   Patient Details  Name: Leslie Bryant MRN: [id number] DOB: 10-Apr-1941  Admission/Discharge Information   Admit Date:  2021/05/09  Date of Death: Date of Death: 05-28-21  Time of Death: Time of Death: 1341/02/13  Length of Stay: February 10, 2023  Referring Physician: Biagio Borg, MD   Reason(s) for Hospitalization  Acute hypoxemic respiratory failure  Diagnoses  Preliminary cause of death: acute respiratory distress syndrome Secondary Diagnoses (including complications and co-morbidities):  Principal Problem:   Acute hypoxemic respiratory failure ([place]) Active Problems:   Essential hypertension   Hyperglycemia   Hyponatremia   Diabetes ([place])   Atrial fibrillation with RVR ([place])   Temporal arteritis ([facility])   Atrial fibrillation, rapid ([place])   CAP (community acquired pneumonia)   ARDS (adult respiratory distress syndrome) ([place])   Acute renal failure (ARF) ([place])   Sepsis without acute organ dysfunction ([place])   Endotracheally intubated   Respiratory failure ([place])   Multifocal pneumonia   Brief Hospital Course (including significant findings, care, treatment, and services provided and events leading to death)  Leslie Bryant is a 80 y.o. year old female who was admitted with hypoxemia due to CAP and volume overload. She had progressive respiratory failure. Worsening CXR. Felt primarily driven by developing ARDS and pneumonia. Intubated HD 2. Developed septic shock due to pneumonia, required pressors. Developed renal failure. Pressors weaned off 6/20. CRRT started 6/21. High FiO2 requirements, heavily sedated. Developed Afib with RVR required amiodarone. Proned 6/23. Weaned FiO2 6/26.  Extubated 7/1. Made DNR. Renal failure persisted. She had ongoing hypoxemic respiratory failure requiring high oxygen. Kemp discussion led by palliative care resulted in decision to pursue comfort measures. These were instituted evening May 28, 2023. She passed away surrounded by family the afternoon of 05-29-2023 at 45.      Pertinent Labs and Studies  Significant Diagnostic Studies DG Abd 1 View  Result Date: 05/03/2021 CLINICAL DATA:  80 year old female status post NG placement. EXAM: ABDOMEN - 1 VIEW COMPARISON:  Radiograph dated 04/26/2021. FINDINGS: Partially visualized enteric tube with weighted tip in the distal stomach. No bowel dilatation. Right upper quadrant cholecystectomy clips. The osseous structures are intact. IMPRESSION: Enteric tube with weighted tip in the distal stomach. Electronically Signed   By: Anner Crete M.D.   On: 05/03/2021 20:13   DG Abd 1 View  Result Date: 04/26/2021 CLINICAL DATA:  Follow-up pneumonia, intubated, NG tube placement EXAM: PORTABLE CHEST 1 VIEW COMPARISON:  04/24/2021 FINDINGS: No significant change in AP portable chest radiograph with extensive bilateral heterogeneous and interstitial airspace opacity, most conspicuous in the right midlung. No new airspace opacity. Support apparatus is unchanged including endotracheal tube, esophagogastric tube, and right neck vascular catheter. Mild cardiomegaly. Nonobstructive pattern of bowel gas. Weighted enteric feeding tube is position with tip over the gastric body. Metallic stylette remains in position. IMPRESSION: 1. No significant change in AP portable chest radiograph with extensive bilateral heterogeneous and interstitial airspace opacity, most conspicuous in the right midlung, consistent with multifocal infection. No new airspace opacity. 2. Support apparatus unchanged. 3. Weighted enteric feeding tube is positioned with tip over the gastric body. Recommend advancement if post pyloric positioning is desired. Electronically Signed   By: Eddie Candle M.D.   On: 04/26/2021 09:32   CT HEAD WO CONTRAST  Result Date: 04/16/2021 CLINICAL DATA:  Fall, on Eliquis EXAM: CT HEAD WITHOUT CONTRAST TECHNIQUE: Contiguous axial images were obtained from the base of the skull through the vertex without intravenous contrast. COMPARISON:   CT 04/13/2021  FINDINGS: Brain: No evidence of acute infarction, hemorrhage, hydrocephalus, extra-axial collection, visible mass lesion or mass effect. Symmetric prominence of the ventricles, cisterns and sulci compatible with parenchymal volume loss. Patchy areas of white matter hypoattenuation are most compatible with chronic microvascular angiopathy. Vascular: Atherosclerotic calcification of the carotid siphons. No hyperdense vessel. Skull: Some residual right frontal scalp swelling. More chronic appearing left frontal scalp infiltration/scarring. No new sites of focal swelling or large hematoma. No calvarial fracture or worrisome osseous injuries. Right malar soft tissue swelling is present without visible facial bone fracture within the included margins of imaging. Sinuses/Orbits: Paranasal sinuses and mastoid air cells are predominantly clear. Included orbital structures are unremarkable. Other: None. IMPRESSION: New right malar swelling without subjacent fracture of the visible facial bones or calvaria. Resolving right frontal scalp swelling. No acute intracranial abnormality. Background of parenchymal volume loss, microvascular angiopathy and intracranial atherosclerosis. Electronically Signed   By: Lovena Le M.D.   On: 04/16/2021 06:02   CT Head Wo Contrast  Result Date: 04/13/2021 CLINICAL DATA:  The patient suffered a fall and blow to the head today. Initial encounter. EXAM: CT HEAD WITHOUT CONTRAST CT CERVICAL SPINE WITHOUT CONTRAST TECHNIQUE: Multidetector CT imaging of the head and cervical spine was performed following the standard protocol without intravenous contrast. Multiplanar CT image reconstructions of the cervical spine were also generated. COMPARISON:  None. FINDINGS: CT HEAD FINDINGS Brain: No evidence of acute infarction, hemorrhage, hydrocephalus, extra-axial collection or mass lesion/mass effect. Chronic microvascular ischemic change noted. Vascular: No hyperdense vessel or  unexpected calcification. Skull: Intact.  No focal lesion. Sinuses/Orbits: Negative. Other: None. CT CERVICAL SPINE FINDINGS Alignment: Trace anterolisthesis C4 degenerative on C5 is due change. To facet No traumatic listhesis. Skull base and vertebrae: No acute fracture. No primary bone lesion or focal pathologic process. Soft tissues and spinal canal: No prevertebral fluid or swelling. No visible canal hematoma. Disc levels: Loss of disc space height and endplate spurring are worst at C5-6. Multilevel facet degenerative change noted. Upper chest: The lung apices clear. Other: None. IMPRESSION: No acute abnormality head or cervical spine. Chronic microvascular ischemic change. Cervical spondylosis. Electronically Signed   By: Inge Rise M.D.   On: 04/13/2021 11:19   CT CHEST WO CONTRAST  Result Date: 04/20/2021 CLINICAL DATA:  Acute hypoxemic respiratory failure EXAM: CT CHEST WITHOUT CONTRAST TECHNIQUE: Multidetector CT imaging of the chest was performed following the standard protocol without IV contrast. COMPARISON:  09/24/2017, 04/20/2021 FINDINGS: Cardiovascular: Unenhanced imaging of the heart and great vessels demonstrates no pericardial effusion. Dense calcification of the mitral annulus. Normal caliber of the thoracic aorta. Evaluation of the lumen is limited without IV contrast. Mild atherosclerosis of the descending thoracic aorta. Mediastinum/Nodes: Multiple borderline enlarged lymph nodes are seen within the mediastinum, largest in the subcarinal region measuring up to 9 mm in short axis. These may be reactive. Thyroid, trachea, and esophagus are grossly unremarkable. Lungs/Pleura: There is multifocal bilateral airspace disease, greatest in the right lower lobe. There is a mixture of dense consolidation within the lung bases, with more ground-glass attenuation within the upper lung zones. Findings may reflect a combination of edema and infection. There is trace right pleural fluid. No  pneumothorax. Central airways are patent. Mild diffuse bronchiectasis. Upper Abdomen: No acute abnormality. Musculoskeletal: No acute or destructive bony lesions. Postsurgical changes are seen from right mastectomy with right breast prosthesis identified. Reconstructed images demonstrate no additional findings. IMPRESSION: 1. Multifocal bilateral airspace disease, with a mixture of dense consolidation and ground-glass  opacities. The CT appearance is most characteristic of multifocal pneumonia given the areas of dense consolidation. 2. Subcentimeter mediastinal and hilar lymph nodes, likely reactive. No pathologic adenopathy. 3. Trace right pleural effusion. 4.  Aortic Atherosclerosis (ICD10-I70.0). Electronically Signed   By: Randa Ngo M.D.   On: 04/20/2021 19:43   CT Cervical Spine Wo Contrast  Result Date: 04/13/2021 CLINICAL DATA:  The patient suffered a fall and blow to the head today. Initial encounter. EXAM: CT HEAD WITHOUT CONTRAST CT CERVICAL SPINE WITHOUT CONTRAST TECHNIQUE: Multidetector CT imaging of the head and cervical spine was performed following the standard protocol without intravenous contrast. Multiplanar CT image reconstructions of the cervical spine were also generated. COMPARISON:  None. FINDINGS: CT HEAD FINDINGS Brain: No evidence of acute infarction, hemorrhage, hydrocephalus, extra-axial collection or mass lesion/mass effect. Chronic microvascular ischemic change noted. Vascular: No hyperdense vessel or unexpected calcification. Skull: Intact.  No focal lesion. Sinuses/Orbits: Negative. Other: None. CT CERVICAL SPINE FINDINGS Alignment: Trace anterolisthesis C4 degenerative on C5 is due change. To facet No traumatic listhesis. Skull base and vertebrae: No acute fracture. No primary bone lesion or focal pathologic process. Soft tissues and spinal canal: No prevertebral fluid or swelling. No visible canal hematoma. Disc levels: Loss of disc space height and endplate spurring are  worst at C5-6. Multilevel facet degenerative change noted. Upper chest: The lung apices clear. Other: None. IMPRESSION: No acute abnormality head or cervical spine. Chronic microvascular ischemic change. Cervical spondylosis. Electronically Signed   By: Inge Rise M.D.   On: 04/13/2021 11:19   US RENAL  Result Date: 04/22/2021 CLINICAL DATA:  Urinary tract infection, acute renal failure, sepsis EXAM: RENAL / URINARY TRACT ULTRASOUND COMPLETE COMPARISON:  CT 11/18/2019 FINDINGS: Right Kidney: Renal measurements: 11 x 4.9 x 6 = volume: 170 mL. Echogenicity within normal limits. No mass or hydronephrosis visualized. Left Kidney: Renal measurements: 9.5 x 5.7 x 5 = volume: 142 mL. Echogenicity within normal limits. No mass or hydronephrosis visualized. Bladder: Not visualized Other: Technologist describes technically difficult study secondary to poor lighting conditions for this portable study. IMPRESSION: No hydronephrosis Electronically Signed   By: Lucrezia Europe M.D.   On: 04/22/2021 10:53   DG Chest Port 1 View  Result Date: 05/07/2021 CLINICAL DATA:  Respiratory failure. EXAM: PORTABLE CHEST 1 VIEW COMPARISON:  04/30/2021 FINDINGS: There appears to be an endotracheal tube that is 5.7 cm above the carina. Right jugular non tunneled dialysis catheter with the tip in the SVC. Feeding tube extends into the abdomen. Persistent bilateral airspace densities, right side greater than left. Airspace disease may have slightly progressed in the interim on the right side. Surgical clips in the right axilla with a right breast implant. Heart size is grossly stable. IMPRESSION: 1. Bilateral airspace disease, right side greater than left. Right lung disease has slightly progressed since 04/30/2021. Findings are suggestive for multifocal pneumonia. 2. Support apparatuses as described. Electronically Signed   By: Markus Daft M.D.   On: 05/07/2021 08:47   DG Chest Port 1 View  Result Date: 04/30/2021 CLINICAL DATA:   Shortness of breath Hypoxia Respiratory failure EXAM: PORTABLE CHEST 1 VIEW COMPARISON:  04/29/2021 FINDINGS: Right IJ central venous catheter and endotracheal tubes in appropriate position. Enteric tube extends below the left hemidiaphragm. The distal tip is not included in the study. Heart size within normal limits. Mild pulmonary vascular congestion again seen. Mild interval improvement in aeration of the lungs. Right breast prosthesis again noted. IMPRESSION: Mild interval improvement in aeration  of the lungs with airspace opacities still remaining bilaterally. Electronically Signed   By: Miachel Roux M.D.   On: 04/30/2021 07:39   DG CHEST PORT 1 VIEW  Result Date: 04/29/2021 CLINICAL DATA:  Respiratory failure. EXAM: PORTABLE CHEST 1 VIEW COMPARISON:  April 27, 2021 FINDINGS: The feeding tube terminates in the right abdomen, likely in the distal stomach or proximal duodenum. The ETT and right central line are in good position. No pneumothorax. Bilateral pulmonary infiltrates are again identified, worse on the right and similar on the left in the interval. No other interval changes. IMPRESSION: 1. Support apparatus as above. The distal tip of the feeding tube is likely in the distal stomach or proximal duodenum. Consider repositioning before use. 2. Worsening infiltrate on the right and stable infiltrate on the left. Electronically Signed   By: Dorise Bullion III M.D   On: 04/29/2021 16:38   DG CHEST PORT 1 VIEW  Result Date: 04/27/2021 CLINICAL DATA:  ARDS EXAM: PORTABLE CHEST 1 VIEW COMPARISON:  Portable exam 0758 hours compared to 04/26/2021 FINDINGS: Tip of endotracheal tube projects 3.0 cm above carina. Feeding tube extends into stomach. RIGHT jugular line tip projects over SVC. RIGHT breast prosthesis seen. Enlargement of cardiac silhouette. BILATERAL pulmonary infiltrates are present, could represent pulmonary edema or multifocal infection. No pleural effusion or pneumothorax. Bones demineralized.  Surgical clips RIGHT axilla. IMPRESSION: Enlargement of cardiac silhouette. Stable line and tube positions. Diffuse BILATERAL pulmonary infiltrates question pulmonary edema versus multifocal infection. Electronically Signed   By: Lavonia Dana M.D.   On: 04/27/2021 10:20   DG CHEST PORT 1 VIEW  Result Date: 04/26/2021 CLINICAL DATA:  Endotracheally intubated EXAM: PORTABLE CHEST 1 VIEW COMPARISON:  Most recent radiograph earlier today.  CT 04/20/2021 FINDINGS: The endotracheal tube tip is at the level of the clavicular heads 6.3 cm from the carina. Right central line tip unchanged overlying the upper SVC. Enteric tube in place with tip not included in the field of view. No significant change in the extensive bilateral lung opacities from earlier today. No pneumothorax or large pleural effusion. IMPRESSION: 1. Endotracheal tube tip at the level of the clavicular heads 6.3 cm from the carina. 2. Unchanged extensive bilateral lung opacities from earlier today. Electronically Signed   By: Keith Rake M.D.   On: 04/26/2021 16:08   DG Chest Port 1 View  Result Date: 04/26/2021 CLINICAL DATA:  Follow-up pneumonia, intubated, NG tube placement EXAM: PORTABLE CHEST 1 VIEW COMPARISON:  04/24/2021 FINDINGS: No significant change in AP portable chest radiograph with extensive bilateral heterogeneous and interstitial airspace opacity, most conspicuous in the right midlung. No new airspace opacity. Support apparatus is unchanged including endotracheal tube, esophagogastric tube, and right neck vascular catheter. Mild cardiomegaly. Nonobstructive pattern of bowel gas. Weighted enteric feeding tube is position with tip over the gastric body. Metallic stylette remains in position. IMPRESSION: 1. No significant change in AP portable chest radiograph with extensive bilateral heterogeneous and interstitial airspace opacity, most conspicuous in the right midlung, consistent with multifocal infection. No new airspace opacity.  2. Support apparatus unchanged. 3. Weighted enteric feeding tube is positioned with tip over the gastric body. Recommend advancement if post pyloric positioning is desired. Electronically Signed   By: Eddie Candle M.D.   On: 04/26/2021 09:32   DG CHEST PORT 1 VIEW  Result Date: 04/24/2021 CLINICAL DATA:  Central line placement EXAM: PORTABLE CHEST 1 VIEW COMPARISON:  04/23/2021 FINDINGS: Endotracheal tube remains in good position. NG tube enters the  stomach with the tip not visualized. Right jugular central venous catheter tip in the mid SVC. No pneumothorax. Extensive bilateral airspace disease with mild progression. No significant pleural effusion. IMPRESSION: Worsening bilateral airspace disease Satisfactory right jugular central venous catheter placement. Endotracheal tube in good position. Electronically Signed   By: Franchot Gallo M.D.   On: 04/24/2021 15:08   DG CHEST PORT 1 VIEW  Result Date: 04/23/2021 CLINICAL DATA:  Respiratory failure, intubation EXAM: PORTABLE CHEST 1 VIEW COMPARISON:  Portable exam 0435 hours compared to 04/22/2021 FINDINGS: Tip of endotracheal tube projects 4.0 cm above carina. Nasogastric tube extends into stomach. Enlargement of cardiac silhouette. Diffuse BILATERAL pulmonary infiltrates, increased. No pleural effusion or pneumothorax. IMPRESSION: Increased BILATERAL pulmonary infiltrates. Electronically Signed   By: Lavonia Dana M.D.   On: 04/23/2021 08:02   DG CHEST PORT 1 VIEW  Result Date: 04/22/2021 CLINICAL DATA:  Adult respiratory distress syndrome. Endotracheal tube present. EXAM: PORTABLE CHEST 1 VIEW COMPARISON:  04/21/2021 FINDINGS: Endotracheal tube and nasogastric tube remain in appropriate position. Heart size is stable. Bilateral pulmonary airspace disease is again seen which predominates in the peripheral lung zones, right side greater than left. This shows no significant change compared to previous study. No evidence of pneumothorax or pleural effusion.  IMPRESSION: Stable bilateral pulmonary airspace disease, right side greater than left. Electronically Signed   By: Marlaine Hind M.D.   On: 04/22/2021 06:13   DG Chest Port 1 View  Result Date: 04/21/2021 CLINICAL DATA:  ARDS, status post bronchoscopy EXAM: PORTABLE CHEST 1 VIEW COMPARISON:  04/21/2021 at 6:40 a.m. FINDINGS: Single frontal view of the chest demonstrates endotracheal tube overlying tracheal air column tip at thoracic inlet. Enteric catheter passes below diaphragm tip excluded by collimation and side port projecting over gastric fundus. Cardiac silhouette is stable. There are persistent ground-glass opacities throughout the lungs, with denser consolidation at the periphery at the lung bases. No effusion. No evidence of pneumothorax. IMPRESSION: 1. Stable pulmonary opacities consistent with ARDS. 2. Support devices as above. 3. No evidence of complication after bronchoscopy. Electronically Signed   By: Randa Ngo M.D.   On: 04/21/2021 19:44   DG CHEST PORT 1 VIEW  Result Date: 04/21/2021 CLINICAL DATA:  ARDS EXAM: PORTABLE CHEST 1 VIEW COMPARISON:  04/20/2021 FINDINGS: Endotracheal tube remains in good position.  NG tube in the stomach. Diffuse bilateral airspace disease with basilar predominance is unchanged. No significant pleural effusion and no pneumothorax. IMPRESSION: Diffuse bilateral airspace disease unchanged. Endotracheal tube in good position. Electronically Signed   By: Franchot Gallo M.D.   On: 04/21/2021 09:44   Portable Chest x-ray  Result Date: 04/20/2021 CLINICAL DATA:  Endotracheal tube placement single frontal view of the chest demonstrates endotracheal tube EXAM: PORTABLE CHEST 1 VIEW COMPARISON:  04/20/2021 FINDINGS: Overlying tracheal air column, tip approximately 2 cm above carina. Progressive bilateral perihilar airspace disease, right greater than left. No large effusion or pneumothorax. No acute bony abnormality. IMPRESSION: 1. No complications after intubation.  2. Progressive multifocal bilateral airspace disease, favor edema over infection given rapid progression. Electronically Signed   By: Randa Ngo M.D.   On: 04/20/2021 19:37   DG CHEST PORT 1 VIEW  Result Date: 04/20/2021 CLINICAL DATA:  Respiratory distress. EXAM: PORTABLE CHEST 1 VIEW COMPARISON:  April 19, 2021. FINDINGS: Mildly increased right greater than left midlung and basilar patchy airspace opacities. Superimposed mildly prominent interstitial markings, likely chronic. No visible pleural effusions or pneumothorax. Similar cardiomediastinal silhouette. Right axillary clips. No evidence  of acute osseous abnormality. IMPRESSION: Mildly increased right greater than left midlung and basilar patchy airspace opacities, concerning for multifocal pneumonia. Electronically Signed   By: Margaretha Sheffield MD   On: 04/20/2021 10:32   DG Chest Portable 1 View  Result Date: 04/08/2021 CLINICAL DATA:  Shortness of breath.  Weakness.  Now with cough. EXAM: PORTABLE CHEST 1 VIEW COMPARISON:  02/02/2019 FINDINGS: Stable cardiomediastinal contours. Diffuse coarsened interstitial markings are identified bilaterally. New bilateral lower lung zone airspace opacities are noted, right greater than left. The visualized osseous structures appear intact. Right axillary surgical clips and breast prosthesis noted. IMPRESSION: 1. New bilateral lower lung zone airspace opacities, right greater than left. Imaging findings concerning for multifocal pneumonia. 2. Chronic interstitial lung disease. Electronically Signed   By: Kerby Moors M.D.   On: 04/12/2021 16:11   DG Abd Portable 1V  Result Date: 04/26/2021 CLINICAL DATA:  Feeding tube placement EXAM: PORTABLE ABDOMEN - 1 VIEW COMPARISON:  Portable exam 0956 hours compared to 04/26/2021 FINDINGS: Tip of feeding tube projects over duodenal bulb region. Infiltrates in the mid to lower lungs bilaterally. Paucity of bowel gas. Bones demineralized. IMPRESSION: Tip of feeding  tube projects over duodenal bulb region. Electronically Signed   By: Lavonia Dana M.D.   On: 04/26/2021 11:50   DG Abd Portable 1V  Result Date: 04/20/2021 CLINICAL DATA:  Orogastric tube placement. EXAM: PORTABLE ABDOMEN - 1 VIEW COMPARISON:  None. FINDINGS: Tip and side port of the enteric tube below the diaphragm in the stomach. Air-filled loops of small and large bowel in the right abdomen. Right upper quadrant surgical clips consistent with cholecystectomy. IMPRESSION: Tip and side port of the enteric tube below the diaphragm in the stomach. Electronically Signed   By: Keith Rake M.D.   On: 04/20/2021 23:58   VAS [place] GROIN PSEUDOANEURYSM  Result Date: 04/30/2021  ARTERIAL PSEUDOANEURYSM  Patient Name:  KEYLAH DARWISH  Date of Exam:   04/30/2021 Medical Rec #: [id number]      Accession #:    [id number] Date of Birth: [birth year]       Patient Gender: F Patient Age:   79Y Exam [facility]  [facility] Procedure:      VAS [place] Gloriajean Dell Referring Phys: [id number] Francesca Jewett --------------------------------------------------------------------------------  Exam: Right groin Indications: Patient complains of bruising and localized swelling/lump. History: S/P arterial line to right groin. Limitations: tissue properties & localized swelling/lump Comparison Study: No previous Performing Technologist: Jody Hill RVT, RDMS  Examination Guidelines: A complete evaluation includes B-mode imaging, spectral Doppler, color Doppler, and power Doppler as needed of all accessible portions of each vessel. Bilateral testing is considered an integral part of a complete examination. Limited examinations for reoccurring indications may be performed as noted. +------------+----------+--------+------+----------+ Right DuplexPSV (cm/s)WaveformPlaqueComment(s) +------------+----------+--------+------+----------+ CFA             83    biphasic                  +------------+----------+--------+------+----------+ PFA             41    biphasic                 +------------+----------+--------+------+----------+ Prox SFA        53    biphasic                 +------------+----------+--------+------+----------+  Findings: Hematoma with arterial branching seen in area adjacent to.  Diagnosing physician: Ruta Hinds MD Electronically signed  by Ruta Hinds MD on 04/30/2021 at 7:04:13 PM.   --------------------------------------------------------------------------------    Final    ECHOCARDIOGRAM COMPLETE  Result Date: 04/20/2021    ECHOCARDIOGRAM REPORT   Patient Name:   CHANCY CLAROS Date of Exam: 04/20/2021 Medical Rec #:  [id number]     Height:       62.0 in Accession #:    [id number]    Weight:       164.7 lb Date of Birth:  December 23, 1940      BSA:          1.760 m Patient Age:    57 years      BP:           121/70 mmHg Patient Gender: F             HR:           80 bpm. Exam Location:  Inpatient Procedure: 2D Echo, Cardiac Doppler, Color Doppler and Intracardiac            Opacification Agent Indications:    W38.93 Chronic systolic (congestive) heart failure; I48.0                 Paroxysmal atrial fibrillation  History:        Patient has prior history of Echocardiogram examinations, most                 recent 02/07/2020. Risk Factors:Dyslipidemia. GERD. Palpitations.  Sonographer:    Jonelle Sidle Dance Referring Phys: [place]  1. Left ventricular ejection fraction, by estimation, is 55 to 60%. The left ventricle has normal function. The left ventricle has no regional wall motion abnormalities. There is mild left ventricular hypertrophy. Left ventricular diastolic function could not be evaluated.  2. Right ventricular systolic function is moderately reduced. The right ventricular size is moderately enlarged. There is normal pulmonary artery systolic pressure.  3. Left atrial size was severely dilated.  4. The mitral valve is abnormal.  Mild to moderate mitral valve regurgitation. Moderate mitral annular calcification.  5. The aortic valve is tricuspid. Aortic valve regurgitation is trivial. No aortic stenosis is present. FINDINGS  Left Ventricle: Left ventricular ejection fraction, by estimation, is 55 to 60%. The left ventricle has normal function. The left ventricle has no regional wall motion abnormalities. Definity contrast agent was given IV to delineate the left ventricular  endocardial borders. The left ventricular internal cavity size was normal in size. There is mild left ventricular hypertrophy. Left ventricular diastolic function could not be evaluated due to atrial fibrillation. Left ventricular diastolic function could not be evaluated. Right Ventricle: The right ventricular size is moderately enlarged. Right vetricular wall thickness was not well visualized. Right ventricular systolic function is moderately reduced. There is normal pulmonary artery systolic pressure. The tricuspid regurgitant velocity is 2.44 m/s, and with an assumed right atrial pressure of 3 mmHg, the estimated right ventricular systolic pressure is 73.4 mmHg. Left Atrium: Left atrial size was severely dilated. Right Atrium: Right atrial size was normal in size. Pericardium: There is no evidence of pericardial effusion. Mitral Valve: The mitral valve is abnormal. There is mild thickening of the mitral valve leaflet(s). There is mild calcification of the mitral valve leaflet(s). Moderate mitral annular calcification. Mild to moderate mitral valve regurgitation. Tricuspid Valve: The tricuspid valve is grossly normal. Tricuspid valve regurgitation is mild. Aortic Valve: The aortic valve is tricuspid. There is mild aortic valve annular calcification. Aortic valve regurgitation is trivial. No aortic  stenosis is present. Pulmonic Valve: The pulmonic valve was normal in structure. Pulmonic valve regurgitation is trivial. Aorta: The aortic root and ascending aorta are  structurally normal, with no evidence of dilitation. IAS/Shunts: The atrial septum is grossly normal.  LEFT VENTRICLE PLAX 2D LVIDd:         4.20 cm LVIDs:         2.80 cm LV PW:         1.40 cm LV IVS:        1.10 cm LVOT diam:     1.50 cm LV SV:         22 LV SV Index:   13 LVOT Area:     1.77 cm  RIGHT VENTRICLE          IVC RV Basal diam:  2.70 cm  IVC diam: 2.00 cm TAPSE (M-mode): 1.3 cm LEFT ATRIUM              Index       RIGHT ATRIUM           Index LA diam:        4.10 cm  2.33 cm/m  RA Area:     16.80 cm LA Vol (A2C):   113.0 ml 64.20 ml/m RA Volume:   44.80 ml  25.45 ml/m LA Vol (A4C):   79.5 ml  45.17 ml/m LA Biplane Vol: 94.8 ml  53.86 ml/m  AORTIC VALVE LVOT Vmax:   74.05 cm/s LVOT Vmean:  49.050 cm/s LVOT VTI:    0.126 m  AORTA Ao Root diam: 3.30 cm Ao Asc diam:  3.40 cm MITRAL VALVE                TRICUSPID VALVE MV Area (PHT): 4.06 cm     TR Peak grad:   23.8 mmHg MV Decel Time: 187 msec     TR Vmax:        244.00 cm/s MV E velocity: 165.50 cm/s MV A velocity: 71.70 cm/s   SHUNTS MV E/A ratio:  2.31         Systemic VTI:  0.13 m                             Systemic Diam: 1.50 cm Mertie Moores MD Electronically signed by Mertie Moores MD Signature Date/Time: 04/20/2021/3:38:06 PM    Final    [place] RT LOWER EXTREM LTD SOFT TISSUE NON VASCULAR  Result Date: 04/30/2021 CLINICAL DATA:  Right groin prominence following arterial line rim EXAM: ULTRASOUND RIGHT LOWER EXTREMITY LIMITED TECHNIQUE: Ultrasound examination of the lower extremity soft tissues was performed in the area of clinical concern. COMPARISON:  None. FINDINGS: Scanning in the area of clinical concern reveals a complex fluid collection measuring 6.5 x 2.7 x 5.1 cm. A hematocrit level is noted within in these changes are consistent with evolving hematoma. No definitive vascularity is noted within to suggest definitive pseudoaneurysm. IMPRESSION: Findings most consistent with a subcutaneous evolving hematoma. Electronically Signed    By: Inez Catalina M.D.   On: 04/30/2021 14:15   [place] EKG SITE RITE  Result Date: 04/20/2021 If Site Rite image not attached, placement could not be confirmed due to current cardiac rhythm.   Microbiology Recent Results (from the past 240 hour(s))  Culture, blood (routine x 2)     Status: None   Collection Time: 04/29/21 11:43 AM   Specimen: BLOOD  Result Value Ref Range Status  Specimen Description   Final    BLOOD BLOOD LEFT HAND Performed at [facility] [address], [place], [place] [address]    Special Requests   Final    BOTTLES DRAWN AEROBIC ONLY Blood Culture adequate volume Performed at [facility] [address] [place], [place] [address]    Culture   Final    NO GROWTH 5 DAYS Performed at [facility], [place] [address] [place], [place] [address]    Report Status 05/04/2021 FINAL  Final  Culture, blood (routine x 2)     Status: Abnormal   Collection Time: 04/29/21 11:43 AM   Specimen: BLOOD  Result Value Ref Range Status   Specimen Description   Final    BLOOD BLOOD LEFT HAND Performed at [facility] [address], [place], [place] [address]    Special Requests   Final    BOTTLES DRAWN AEROBIC ONLY Blood Culture adequate volume Performed at [facility] [address], [place], [place] [address]    Culture  Setup Time   Final    GRAM POSITIVE COCCI IN CLUSTERS AEROBIC BOTTLE ONLY CRITICAL RESULT CALLED TO, READ BACK BY AND VERIFIED WITH: EGlennon Mac PHARMD, AT 2228 04/30/21 D. VANHOOK    Culture (A)  Final    STAPHYLOCOCCUS EPIDERMIDIS THE SIGNIFICANCE OF ISOLATING THIS ORGANISM FROM A SINGLE SET OF BLOOD CULTURES WHEN MULTIPLE SETS ARE DRAWN IS UNCERTAIN. PLEASE NOTIFY THE MICROBIOLOGY DEPARTMENT WITHIN ONE WEEK IF SPECIATION AND SENSITIVITIES ARE REQUIRED. Performed at [facility], [place] [address] [place], [place] [address]    Report Status 05/01/2021 FINAL  Final   Blood Culture ID Panel (Reflexed)     Status: Abnormal   Collection Time: 04/29/21 11:43 AM  Result Value Ref Range Status   Enterococcus faecalis NOT DETECTED NOT DETECTED Final   Enterococcus Faecium NOT DETECTED NOT DETECTED Final   Listeria monocytogenes NOT DETECTED NOT DETECTED Final   Staphylococcus species DETECTED (A) NOT DETECTED Final    Comment: CRITICAL RESULT CALLED TO, READ BACK BY AND VERIFIED WITH: EGlennon Mac PHARMD, AT 2228 04/30/21 D. VANHOOK    Staphylococcus aureus (BCID) NOT DETECTED NOT DETECTED Final   Staphylococcus epidermidis DETECTED (A) NOT DETECTED Final    Comment: Methicillin (oxacillin) resistant coagulase negative staphylococcus. Possible blood culture contaminant (unless isolated from more than one blood culture draw or clinical case suggests pathogenicity). No antibiotic treatment is indicated for blood  culture contaminants. CRITICAL RESULT CALLED TO, READ BACK BY AND VERIFIED WITH: Seleta Rhymes PHARMD, AT 2228 04/30/21 D. VANHOOK    Staphylococcus lugdunensis NOT DETECTED NOT DETECTED Final   Streptococcus species NOT DETECTED NOT DETECTED Final   Streptococcus agalactiae NOT DETECTED NOT DETECTED Final   Streptococcus pneumoniae NOT DETECTED NOT DETECTED Final   Streptococcus pyogenes NOT DETECTED NOT DETECTED Final   A.calcoaceticus-baumannii NOT DETECTED NOT DETECTED Final   Bacteroides fragilis NOT DETECTED NOT DETECTED Final   Enterobacterales NOT DETECTED NOT DETECTED Final   Enterobacter cloacae complex NOT DETECTED NOT DETECTED Final   Escherichia coli NOT DETECTED NOT DETECTED Final   Klebsiella aerogenes NOT DETECTED NOT DETECTED Final   Klebsiella oxytoca NOT DETECTED NOT DETECTED Final   Klebsiella pneumoniae NOT DETECTED NOT DETECTED Final   Proteus species NOT DETECTED NOT DETECTED Final   Salmonella species NOT DETECTED NOT DETECTED Final   Serratia marcescens NOT DETECTED NOT DETECTED Final   Haemophilus influenzae NOT DETECTED NOT  DETECTED Final   Neisseria meningitidis NOT DETECTED NOT DETECTED Final   Pseudomonas aeruginosa NOT DETECTED NOT DETECTED Final   Stenotrophomonas maltophilia NOT DETECTED NOT DETECTED Final   Candida albicans NOT DETECTED NOT DETECTED Final   Candida auris NOT DETECTED NOT DETECTED Final   Candida glabrata NOT DETECTED NOT DETECTED Final   Candida krusei NOT DETECTED NOT DETECTED Final   Candida parapsilosis NOT DETECTED NOT DETECTED Final   Candida tropicalis NOT DETECTED NOT DETECTED Final   Cryptococcus neoformans/gattii NOT DETECTED NOT DETECTED Final   Methicillin resistance mecA/C DETECTED (A) NOT DETECTED Final    Comment: CRITICAL RESULT CALLED TO, READ BACK BY AND VERIFIED WITHGustavo Lah, AT 2228 04/30/21 [place] Performed at [facility] [address] [place], [place] [address]   Culture, Respiratory w Gram Stain     Status: None   Collection Time: 05/01/21  3:00 AM   Specimen: Tracheal Aspirate; Respiratory  Result Value Ref Range Status   Specimen Description   Final    TRACHEAL ASPIRATE Performed at [facility] [address] [place], [place] [address]    Special Requests   Final    NONE Performed at [facility], [place] [address] [place], [place] [address]    Gram Stain   Final    FEW WBC PRESENT, PREDOMINANTLY PMN FEW GRAM NEGATIVE RODS RARE GRAM POSITIVE COCCI IN CLUSTERS    Culture   Final    Normal respiratory flora-no Staph aureus or Pseudomonas seen Performed at [facility], [place] [address], [place], [place] [address]    Report Status 05/03/2021 FINAL  Final    Lab Basic Metabolic Panel: Recent Labs  Lab 05/03/21 0500 05/03/21 1733 05/04/21 0500 05/05/21 0530 05/05/21 1622 05/06/21 0510 05/06/21 1600  NA 136   < > 137 137 138 137 137  K 4.4   < > 3.9 4.0 3.7 3.4* 3.9  CL 105   < > 102 101 103 101 101  CO2 23   < > 26 26 27 26 26   GLUCOSE 207*   < > 146* 131* 180* 125* 130*   BUN 65*   < > 47* 34* 33* 32* 32*  CREATININE 1.52*   < > 1.08* 0.75 0.60 0.75 0.71  CALCIUM 8.2*   < > 7.9* 8.1* 8.0* 7.9* 7.9*  MG 2.7*  --  2.6* 2.6*  --  2.5*  --   PHOS 5.5*   < > 3.0 3.3 2.7 3.3 3.0   < > = values in this interval not displayed.   Liver Function Tests: Recent Labs  Lab 05/04/21 0500 05/05/21 0530 05/05/21 1622 05/06/21 0510 05/06/21 1600  ALBUMIN 1.8* 2.0* 2.0* 1.9* 2.0*   No results for input(s): LIPASE, AMYLASE in the last 168 hours. No results for input(s): AMMONIA in the last 168 hours. CBC: Recent Labs  Lab 05/03/21 0500 05/04/21 0500 05/05/21 0432 05/06/21 0510 05/06/21 1600 05/07/21 0430  WBC 15.0* 12.5* 15.8* 14.3*  --  13.0*  HGB 7.9* 7.3* 8.0* 6.9* 8.3* 8.2*  HCT 26.0* 23.7* 25.6* 22.3* 26.4* 26.1*  MCV 103.6* 102.2* 103.6* 103.2*  --  100.8*  PLT 211 199 224 194  --  187   Cardiac Enzymes: No results for input(s): CKTOTAL, CKMB, CKMBINDEX, TROPONINI in the last 168 hours. Sepsis Labs: Recent Labs  Lab 05/04/21 0500 05/05/21 0432 05/06/21 0510 05/07/21 0430  WBC 12.5* 15.8* 14.3* 13.0*    Procedures/Operations  As per EMR   Bonna Gains Oluwaseun Cremer 05/09/2021, 7:40 AM

## 2021-06-04 NOTE — TOC Progression Note (Signed)
Transition of Care [facility]) - Progression Note    Patient Details  Name: Leslie Bryant MRN: [id number] Date of Birth: 1941-03-05  Transition of [facility]) CM/SW Contact  Leeroy Cha, RN Phone Number: May 20, 2021, 7:53 AM  Clinical Narrative:    Text message sent to Farrel Gordon with [employer] for family.   Expected Discharge Plan: Home w Hospice Care Barriers to Discharge: Continued Medical Work up  Expected Discharge Plan and Services Expected Discharge Plan: [place]   Discharge Planning Services: CM Consult   Living arrangements for the past 2 months: Single Family Home                                       Social Determinants of Health (SDOH) Interventions    Readmission Risk Interventions No flowsheet data found.

## 2021-06-04 NOTE — Progress Notes (Signed)
Asystole on the monitor, 2 RN bedside verification Gaspar Bidding Polinag RN) no pulse present on ausculation. Patient currently comfort care/comfort measures, DNR. Family at bedside, primary RN Sharyn Lull and Percell Miller RN arrived during bedside verification.

## 2021-06-04 NOTE — Progress Notes (Signed)
Daily Progress Note   Patient Name: Leslie Bryant       Date: 2021/05/27 DOB: 11/30/1940  Age: 80 y.o. MRN#: [id number] Attending Physician: Lanier Clam, MD Primary Care Physician: Biagio Borg, MD Admit Date: 04/25/2021  Reason for Consultation/Follow-up: Terminal Care  Subjective: Unresponsive. Family at bedside. Report issues with secretions and dyspnea but improved now.  Length of Stay: 19  Current Medications: Scheduled Meds:   chlorhexidine gluconate (MEDLINE KIT)  15 mL Mouth Rinse BID   mouth rinse  15 mL Mouth Rinse 10 times per day   sodium chloride flush  10-40 mL Intracatheter Q12H    Continuous Infusions:  sodium chloride Stopped (05/03/21 1351)    PRN Meds: acetaminophen (TYLENOL) oral liquid 160 mg/5 mL, acetaminophen, antiseptic oral rinse, glycopyrrolate, haloperidol **OR** haloperidol **OR** haloperidol lactate, HYDROmorphone (DILAUDID) injection, lip balm, LORazepam **OR** LORazepam **OR** LORazepam, ondansetron **OR** ondansetron (ZOFRAN) IV, polyvinyl alcohol, sodium chloride flush, sodium chloride HYPERTONIC  Physical Exam          Vital Signs: BP 109/60   Pulse (!) 107   Temp 98.4 F (36.9 C) (Axillary)   Resp (!) 21   Ht 5' 2"  (1.575 m)   Wt 71.1 kg   SpO2 (!) 59%   BMI 28.67 kg/m  SpO2: SpO2: (!) 59 % O2 Device: O2 Device: Room Air (O2 turned off by MD, MD reviewed plan of care with family) O2 Flow Rate: O2 Flow Rate (L/min): 6 L/min  Intake/output summary:  Intake/Output Summary (Last 24 hours) at May 27, 2021 1108 Last data filed at May 27, 2021 2449 Gross per 24 hour  Intake 10 ml  Output 0 ml  Net 10 ml   LBM: Last BM Date: 05/27/21 Baseline Weight: Weight: 77.1 kg Most recent weight: Weight: 71.1 kg       Palliative  Assessment/Data:    Flowsheet Rows    Flowsheet Row Most Recent Value  Intake Tab   Referral Department Critical care  Unit at Time of Referral ICU  Palliative Care Primary Diagnosis Sepsis/Infectious Disease  Date Notified 05/06/21  Palliative Care Type New Palliative care  Reason for referral Clarify Goals of Care, End of Life Care Assistance  Date of Admission 04/08/2021  Date first seen by Palliative Care 05/07/21  # of days Palliative referral response time 1 Day(s)  #  of days IP prior to Palliative referral 17  Clinical Assessment   Palliative Performance Scale Score 10%  Psychosocial & Spiritual Assessment   Palliative Care Outcomes   Patient/Family meeting held? Yes  Who was at the meeting? Husband, daughter, son       Patient Active Problem List   Diagnosis Date Noted   Multifocal pneumonia    Endotracheally intubated    Respiratory failure ([place])    Acute renal failure (ARF) ([place])    Sepsis without acute organ dysfunction ([place])    ARDS (adult respiratory distress syndrome) ([place])    Acute hypoxemic respiratory failure ([place]) 05/02/2021   Atrial fibrillation, rapid ([place]) 04/10/2021   CAP (community acquired pneumonia) 04/29/2021   Temporal arteritis ([place]) 02/04/2021   Elevated LFTs 02/01/2021   Renal insufficiency 02/01/2021   Leukocytosis 02/01/2021   Atrial fibrillation with RVR ([place]) 02/15/2020   Secondary hypercoagulable state ([place]) 02/15/2020   Nocturia 11/11/2019   Hematochezia 11/09/2019   Ganglion, finger joint of right hand 10/08/2019   Osteoarthritis of finger of left hand 10/08/2019   Acute sinus infection 09/11/2019   Left otitis media 07/28/2019   Venous insufficiency 02/09/2018   Chest pain 09/22/2017   Neck pain 09/22/2017   Abdominal pain, left lower quadrant 04/03/2017   Diarrhea 04/03/2017   Weight loss 04/03/2017   Total knee replacement status, right 02/19/2017   Bilateral otitis media with effusion 01/23/2017   Preoperative  cardiovascular examination 12/24/2016   Hyperglycemia 11/06/2016   Hyponatremia 11/06/2016   Diabetes ([place]) 11/06/2016   Dysuria 04/30/2016   Plantar fasciitis of right foot 12/28/2014   Metatarsal deformity 12/28/2014   Unspecified deficiency anemia 06/09/2013   Anemia, unspecified 05/06/2013   Lower back pain 02/01/2013   Osteoarthritis of left knee 09/08/2012   Complex tear of medial meniscus of left knee as current injury 09/08/2012   Abdominal pain, lower 07/23/2012   Encounter for well adult exam with abnormal findings 05/05/2012   Family hx of colon cancer 05/05/2012   Unilateral primary osteoarthritis, right knee 02/06/2011   History of breast cancer 02/06/2011   Palpitations    Supraventricular tachycardia ([place])    EUSTACHIAN TUBE DYSFUNCTION, LEFT 04/11/2010   Anxiety state 05/05/2008   Essential hypertension 05/05/2008   Allergic rhinitis 05/05/2008   IBS 05/05/2008   OSTEOPENIA 05/05/2008   COLONIC POLYPS, HX OF 05/05/2008   Hyperlipidemia 05/29/2007   GERD 05/29/2007    Palliative Care Assessment & Plan   Patient Profile: 80 yo transitioned to comfort measures only on 7/4 following VDRF.  Assessment: Actively dying. Nasal cannula has been removed. Saturations in the 50's. No signs of distress. Has required frequent PRN dosing of hydromorphone and ativan.  Recommendations/Plan: Continue CMO. Anticipate hospital death within 24 hours. Not stable for transport. Ativan dosing every 2 hours and hydromorphone prn -at this time appears very comfortable-if needed will initiate infusion.  Goals of Care and Additional Recommendations: Limitations on Scope of Treatment: Full Comfort Care  Code Status:    Code Status Orders  (From admission, onward)           Start     Ordered   05/07/21 0932  Do not attempt resuscitation (DNR)  Continuous       Question Answer Comment  In the event of cardiac or respiratory ARREST Do not call a "code blue"   In the event  of cardiac or respiratory ARREST Do not perform Intubation, CPR, defibrillation or ACLS   In the event of cardiac  or respiratory ARREST Use medication by any route, position, wound care, and other measures to relive pain and suffering. May use oxygen, suction and manual treatment of airway obstruction as needed for comfort.   Comments pressors ok, full medical care +. unclear about CRRT      05/07/21 0931           Code Status History     Date Active Date Inactive Code Status Order ID Comments User Context   04/21/2021 1904 05/07/2021 0931 DNR [id number]  Brand Males, MD Inpatient   05/01/2021 2042 04/21/2021 1904 Full Code [id number]  Etta Quill, DO Inpatient   02/19/2017 1029 02/21/2017 1503 Full Code [id number]  Newt Minion, MD Inpatient   11/02/2011 2013 11/08/2011 1323 Full Code [id number]  Harl Bowie, MD ED      Advance Directive Documentation    Flowsheet Row Most Recent Value  Type of Advance Directive Healthcare Power of Attorney, Living will  Pre-existing out of facility DNR order (yellow form or pink MOST form) --  "MOST" Form in Place? --       Prognosis:  Hours - Days  Discharge Planning: Anticipated Hospital Death  Care plan was discussed with family, RN and CCM.  Thank you for allowing the Palliative Medicine Team to assist in the care of this patient.   Time In: 1045 Time Out: 11:15 Total Time 30 Prolonged Time Billed no      Greater than 50%  of this time was spent counseling and coordinating care related to the above assessment and plan.  Lane Hacker, DO  Please contact Palliative Medicine Team phone at [phone] for questions and concerns.

## 2021-06-04 NOTE — Progress Notes (Signed)
Nutrition Brief Note  Last RD assessment was on 6/28 at which time patient was still on the vent. She has been extubated. Chart reviewed.  Palliative Care last met with family yesterday and decision was made to transition to full comfort care.   No further nutrition interventions planned at this time. Please re-consult as needed.      Jarome Matin, MS, RD, LDN, CNSC Inpatient Clinical Dietitian RD pager # available in [place]  After hours/weekend pager # available in [facility]

## 2021-06-04 NOTE — Telephone Encounter (Signed)
[facility] [employer] [phone]  If the patient makes it out, will need to be discharged to hospice  Requesting Dr. Jenny Reichmann be the attending for hospice care and any other care needed  Please call [place] at [phone]

## 2021-06-04 NOTE — Progress Notes (Signed)
NAME:  Leslie Bryant MRN:  [id number] DOB:  05-29-1941 LOS: 88 ADMISSION DATE:  04/14/2021 CONSULTATION DATE: 04/20/2021 REFERRING MD:  Alfredia Ferguson - [facility] CHIEF COMPLAINT:  SOB, hypoxic respiratory failure   Brief Narrative   80 year old female presented with presented to [facility] 6/16 via EMS for worsening SOB, hypoxia and concern for PNA.On ED evaluation patient was hypoxic on RA and in A-fib RVR .CXR demonsrated bilateral lower lobe infiltrates. Patient was admitted to [facility].  PCCM consulted 6/17 for ongoing hypoxemic respiratory failure despite supplemental O2.  Pertinent Medical History:  A-fib Right breast cancer  Bowel perforation  HLD  Significant Hospital Events:  6/16 Admitted to [facility] for SOB, hypoxia, multifocal PNA 6/17 PCCM consulted for persistent hypoxia, mild lethargy, ICU admission -> intubated later in day. CT chest with bilateral diffuse GGO.  Failed PICC line insertion 6/18 AM worsening AKI - creat 1.8. on vent 80% fio2, supnine. Sedated with fent gtt, versed gtt. On pressors neo. PCT high but culture negative so far. HS trop in 20s. RASS -4. On protoni and coffee ground mild returns noted this morning 6/18 PM in renal failure. Worsening creat. Dropping Ur op despite fluids. Bronch BAL -> 86% polys.  Alv  hge ruled out.  Elliquis now lower dose. Code changed to DNR + full medical care 6/19 renal consulted.  6/20 Remains sedated on vent minimally responsive on fentanyl drip with worsening renal failure, now off pressors 6/21 progressive multi - organ dysfunction including worsening renal failure, HD cath placed Right IJ. CRRT started.  mild liver injury with slight transaminitis, and continued encephalopathy. Cefepime completed.  6/22 amiodarone had to be restarted for af w/ RVR 6/23 starting Proning ->improved oxygenation and vent mechanics. Started low dose lopressor. Feeding tube placed post-pyloric  6/26 no longer requiring proning, FiO2 40% 6/27 tolerating weaning, down to  Fentanyl 83mg, off pressors and antibiotics, 6/28 placed back on fentanyl and Versed overnight, 1 out of 2 blood cultures growing staph epi, started on vancomycin and low-dose neo 6/29 continued issues with agitation and vent dyssynchrony overnight requiring as needed fentanyl 6/30 no acute events overnight required only minimal as needed fentanyl pushes for vent compliance.  Mentation continues to improve daily, currently able to follow simple commands but very deconditioned 7/1 DNR established, no reintubation if fails   7/4 transition to comfort care    Micro: Urine 6/16 with Klebsiella sensitive to ceph   ABX: Vanc 6/27 -6/29 Maxepimem 6/29-6/30 Rocephin 7/1 >>>    Scheduled Meds:  chlorhexidine gluconate (MEDLINE KIT)  15 mL Mouth Rinse BID   mouth rinse  15 mL Mouth Rinse 10 times per day   sodium chloride flush  10-40 mL Intracatheter Q12H   Continuous Infusions:  sodium chloride Stopped (05/03/21 1351)   PRN Meds:.acetaminophen (TYLENOL) oral liquid 160 mg/5 mL, acetaminophen, antiseptic oral rinse, glycopyrrolate, haloperidol **OR** haloperidol **OR** haloperidol lactate, HYDROmorphone (DILAUDID) injection, lip balm, LORazepam **OR** LORazepam **OR** LORazepam, ondansetron **OR** ondansetron (ZOFRAN) IV, polyvinyl alcohol, sodium chloride flush, sodium chloride HYPERTONIC     Interim History / Subjective:  NAEON. Remains on O2. Met with husband, daughter, son. Husband expressed desire to take home. Ok to investigate with palliative. Discussed turning off O2 as to not artificially prolong suffering. Husband contacted me shortly thereafter and requested O2 be turned off. I turned O2 off.   Objective:  Blood pressure 109/60, pulse 99, temperature 98.6 F (37 C), temperature source Axillary, resp. rate 18, height _0  (1.575 m),  weight 71.1 kg, SpO2 97 %.        Intake/Output Summary (Last 24 hours) at 05/12/2021 0840 Last data filed at May 12, 2021 0548 Gross per 24 hour   Intake --  Output 0 ml  Net 0 ml    Filed Weights   05/04/21 0400 05/05/21 0500 05/06/21 0500  Weight: 74.5 kg 69.6 kg 71.1 kg   Physical Exam: General:    comfortable, breathing with open mouth  Lungs: NWOB, on 6L [place] CV: Warm, RRR Neuro  non-responsive.    I personally reviewed images and agree with radiology impression as follows:  CXR:   portable CXR 7/4        Resolved Hospital Problem List:   Septic shock - Pressors stopped 6/19 Klebsiella UTI, POA ARDS CAP Hypoglycemia  Transaminitis   Assessment & Plan:   Acute Hypoxic Respiratory Failure Secondary to CAP and ARDS   Concern for evolving Hosp acquired  pneumonia. Intubated on 6/17, required intermittent prone positioning. Completed course of  Cefepime/Ceftriaxone on 6/21   -no reintubation  D/c ceftriaxone as comfort care   Acute metabolic encephalopathy   Appears to be actively dying    Atrial fibrillation on amiodarone, HFpEF New onset right ventricular failure on admission. Preserved EF on echo 6/17 -d/c heparin as comfort care  Acute kidney injury (POA) Oliguric with renal failure  -Comfort care  Anemia Due to sepsis, blood loss with thigh hematoma, consumption from circuit.  No signs of active bleeding. -transfused  one unit 7/3  -no further labs per comfort  RLE Hematoma of the proximal thigh Developed after A-line removal   [place] Maximus Hoffert, MD  See Amion for contact info

## 2021-06-04 NOTE — Progress Notes (Signed)
Patient became asystole, verified by 2 RNs (Conard Novak, RN and Sport and exercise psychologist). Family at bedside. MD made aware.

## 2021-06-04 DEATH — deceased

## 2021-06-07 LAB — ACID FAST CULTURE WITH REFLEXED SENSITIVITIES (MYCOBACTERIA): Acid Fast Culture: NEGATIVE

## 2021-08-28 ENCOUNTER — Ambulatory Visit: Payer: Medicare Other | Admitting: Internal Medicine

## 2022-03-16 IMAGING — DX DG CHEST 1V PORT
1 series · 1 of 1 positions shown · non-contrast
Comparison: 04/20/2021

CLINICAL DATA: ARDS

EXAM:
PORTABLE CHEST 1 VIEW

[chest ap]
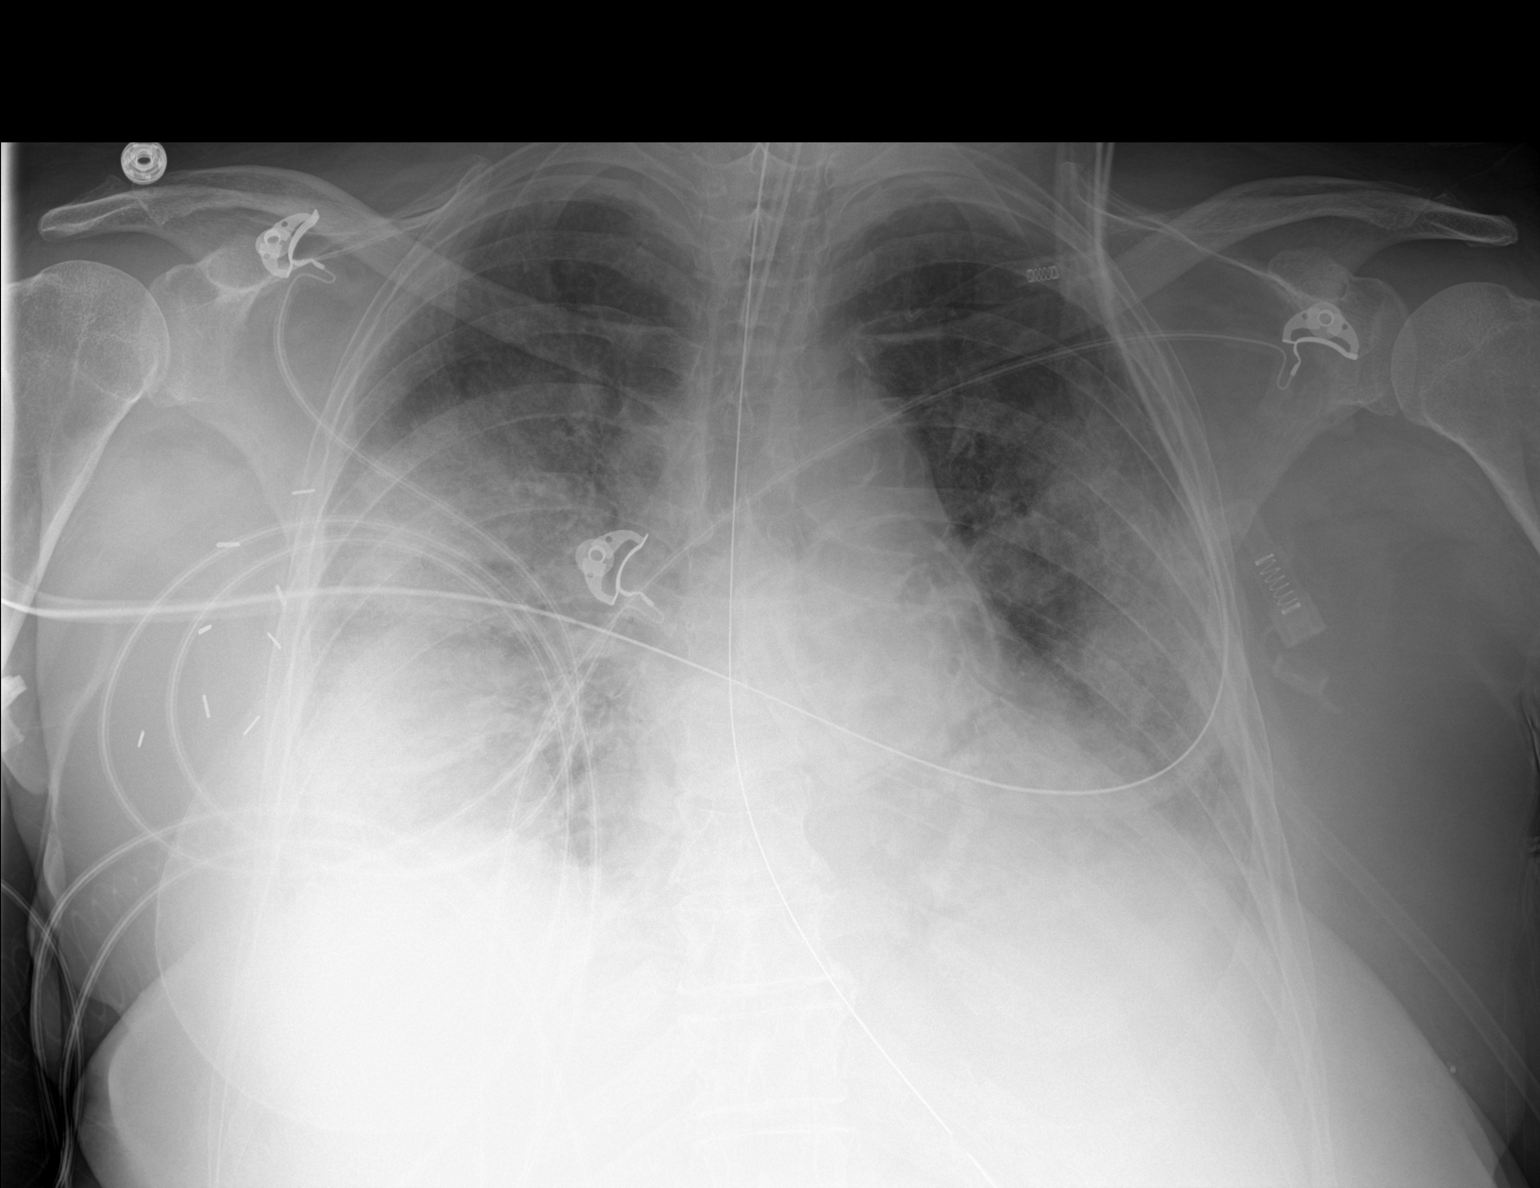

[1 of 1 positions shown; findings below may reference images not displayed]

FINDINGS: Endotracheal tube remains in good position.  NG tube in the stomach.

Diffuse bilateral airspace disease with basilar predominance is
unchanged. No significant pleural effusion and no pneumothorax.
IMPRESSION: Diffuse bilateral airspace disease unchanged. Endotracheal tube in
good position.

## 2022-03-21 IMAGING — DX DG ABD PORTABLE 1V
1 series · 1 of 1 positions shown · non-contrast
Comparison: Portable exam 1608 hours compared to 04/26/2021

CLINICAL DATA: Feeding tube placement

EXAM:
PORTABLE ABDOMEN - 1 VIEW

[abdomen kub]
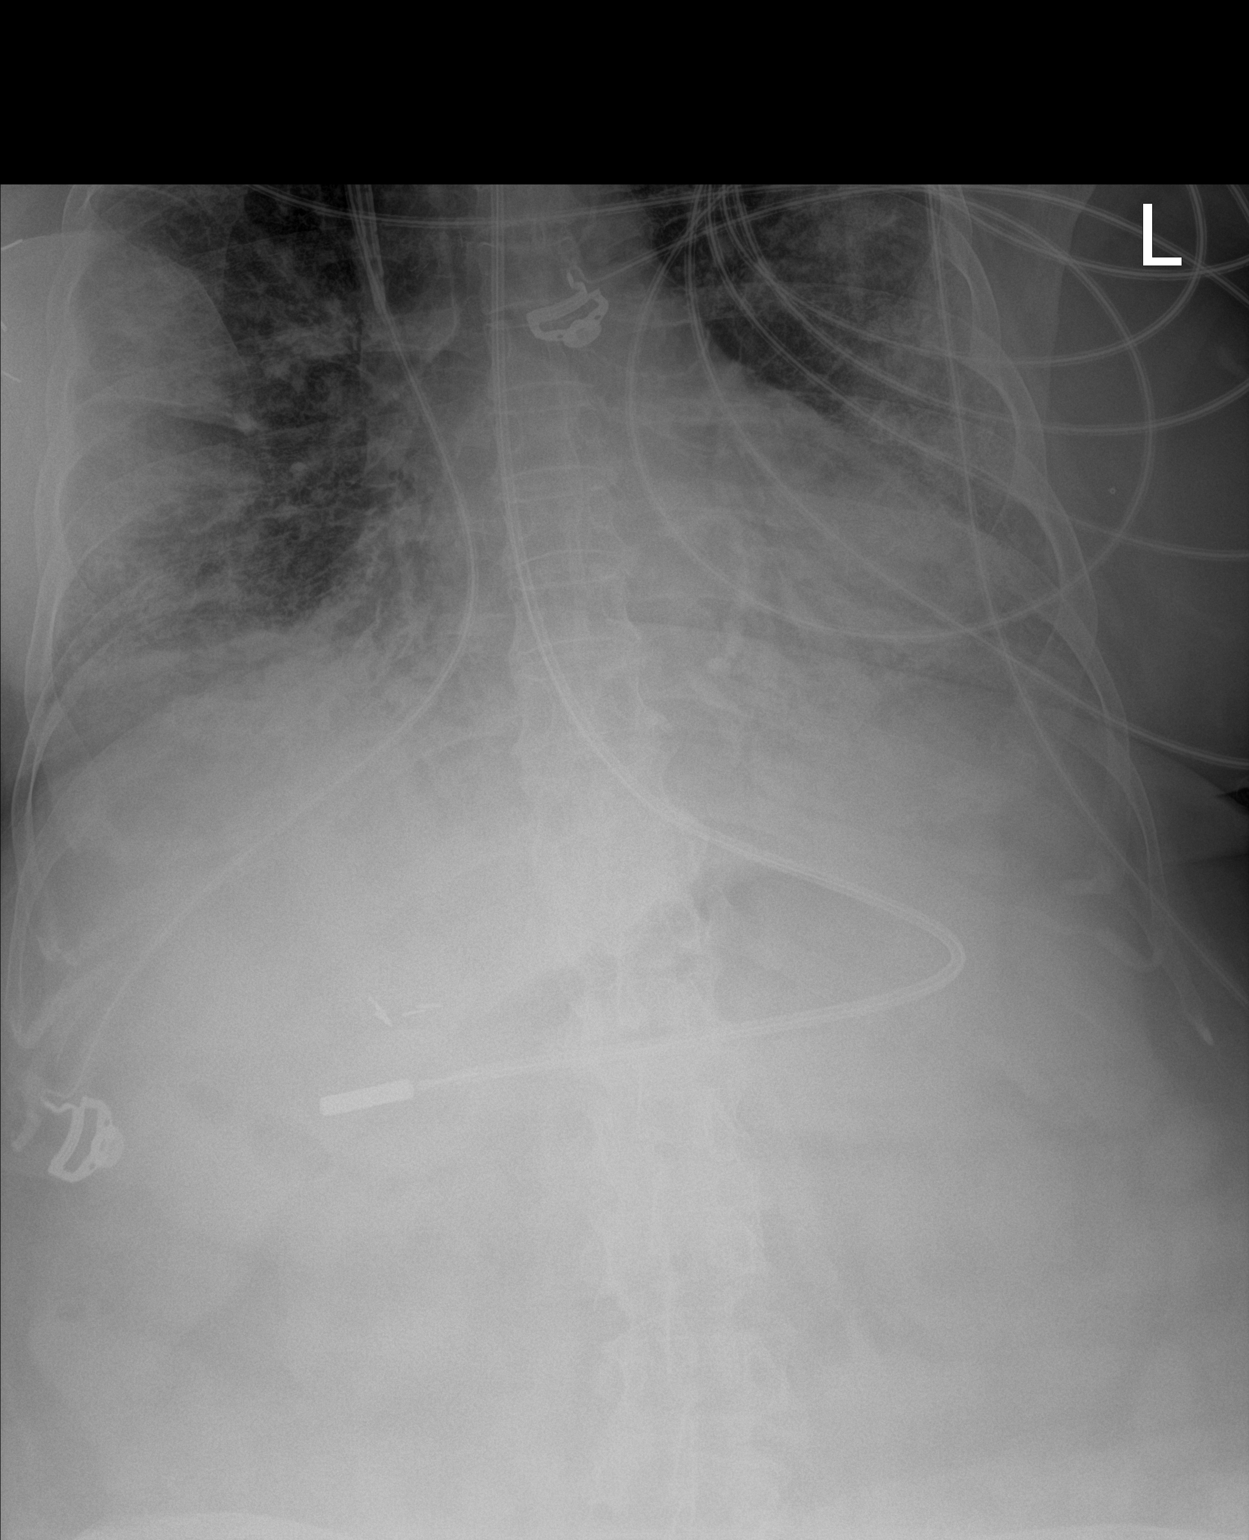

[1 of 1 positions shown; findings below may reference images not displayed]

FINDINGS: Tip of feeding tube projects over duodenal bulb region.

Infiltrates in the mid to lower lungs bilaterally.

Paucity of bowel gas.

Bones demineralized.
IMPRESSION: Tip of feeding tube projects over duodenal bulb region.

## 2022-03-21 IMAGING — DX DG ABDOMEN 1V
1 series · 1 of 1 positions shown · non-contrast
Comparison: 04/24/2021

CLINICAL DATA: Follow-up pneumonia, intubated, NG tube placement

EXAM:
PORTABLE CHEST 1 VIEW

[abdomen kub]
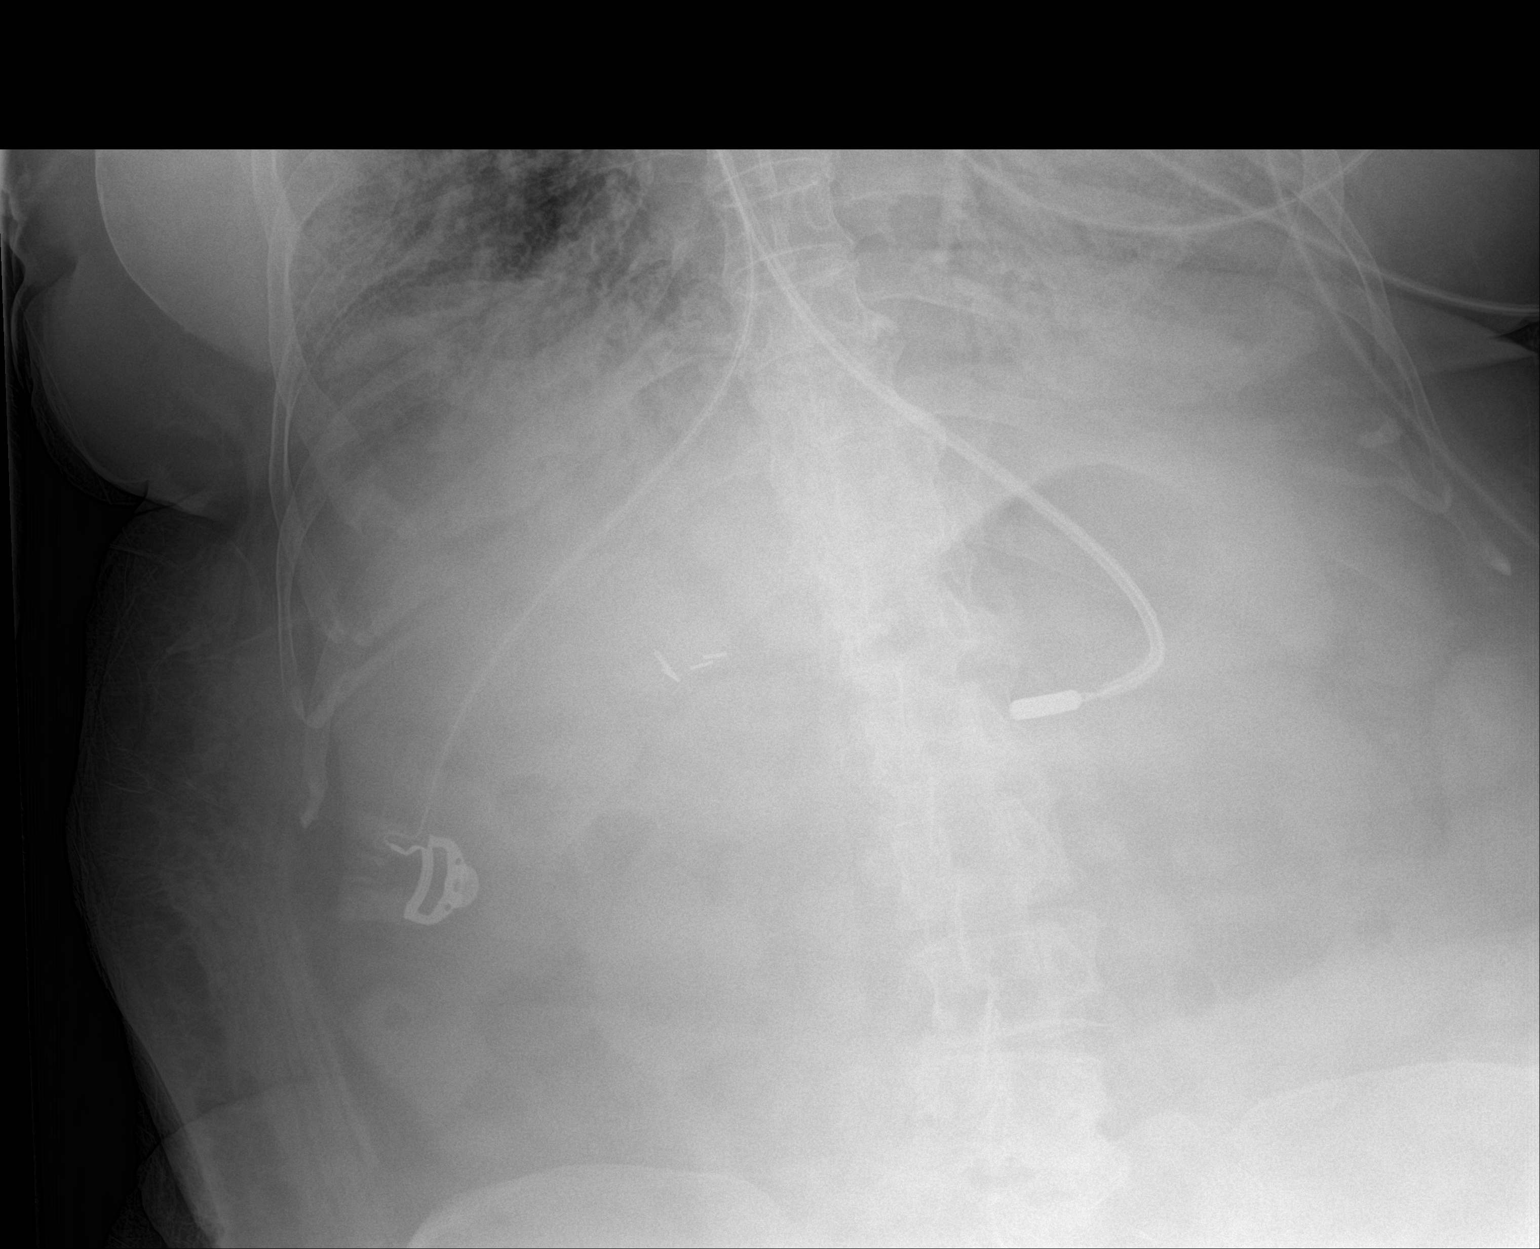

[1 of 1 positions shown; findings below may reference images not displayed]

FINDINGS: No significant change in AP portable chest radiograph with extensive
bilateral heterogeneous and interstitial airspace opacity, most
conspicuous in the right midlung. No new airspace opacity. Support
apparatus is unchanged including endotracheal tube, esophagogastric
tube, and right neck vascular catheter. Mild cardiomegaly.

Nonobstructive pattern of bowel gas. Weighted enteric feeding tube
is position with tip over the gastric body. Metallic stylette
remains in position.
IMPRESSION: 1. No significant change in AP portable chest radiograph with
extensive bilateral heterogeneous and interstitial airspace opacity,
most conspicuous in the right midlung, consistent with multifocal
infection. No new airspace opacity.
2. Support apparatus unchanged.
3. Weighted enteric feeding tube is positioned with tip over the
gastric body. Recommend advancement if post pyloric positioning is
desired.

## 2022-03-22 IMAGING — DX DG CHEST 1V PORT
1 series · 1 of 1 positions shown · non-contrast
Comparison: Portable exam 4289 hours compared to 04/26/2021

CLINICAL DATA: ARDS

EXAM:
PORTABLE CHEST 1 VIEW

[chest ap]
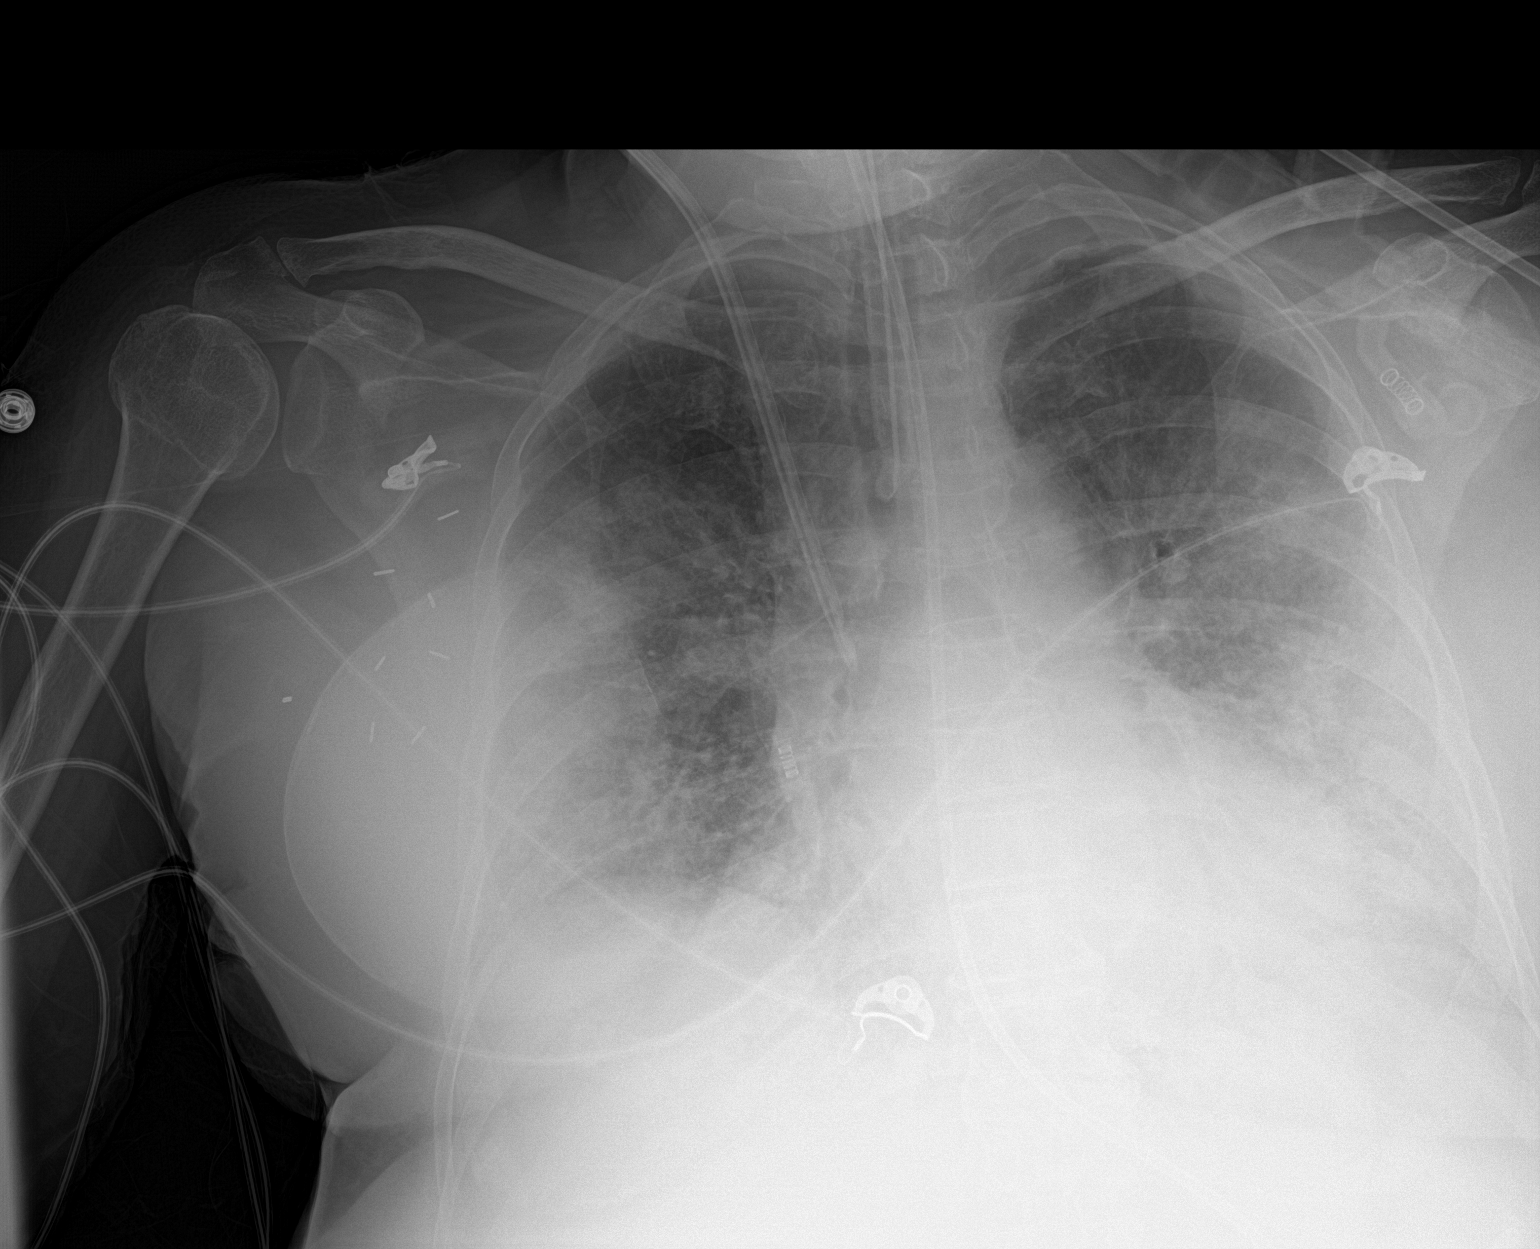

[1 of 1 positions shown; findings below may reference images not displayed]

FINDINGS: Tip of endotracheal tube projects 3.0 cm above carina.

Feeding tube extends into stomach.

RIGHT jugular line tip projects over SVC.

RIGHT breast prosthesis seen.

Enlargement of cardiac silhouette.

BILATERAL pulmonary infiltrates are present, could represent
pulmonary edema or multifocal infection.

No pleural effusion or pneumothorax.

Bones demineralized.

Surgical clips RIGHT axilla.
IMPRESSION: Enlargement of cardiac silhouette.

Stable line and tube positions.

Diffuse BILATERAL pulmonary infiltrates question pulmonary edema
versus multifocal infection.

## 2022-03-24 IMAGING — DX DG CHEST 1V PORT
1 series · 1 of 1 positions shown · non-contrast
Comparison: April 27, 2021

CLINICAL DATA: Respiratory failure.

EXAM:
PORTABLE CHEST 1 VIEW

[chest ap]
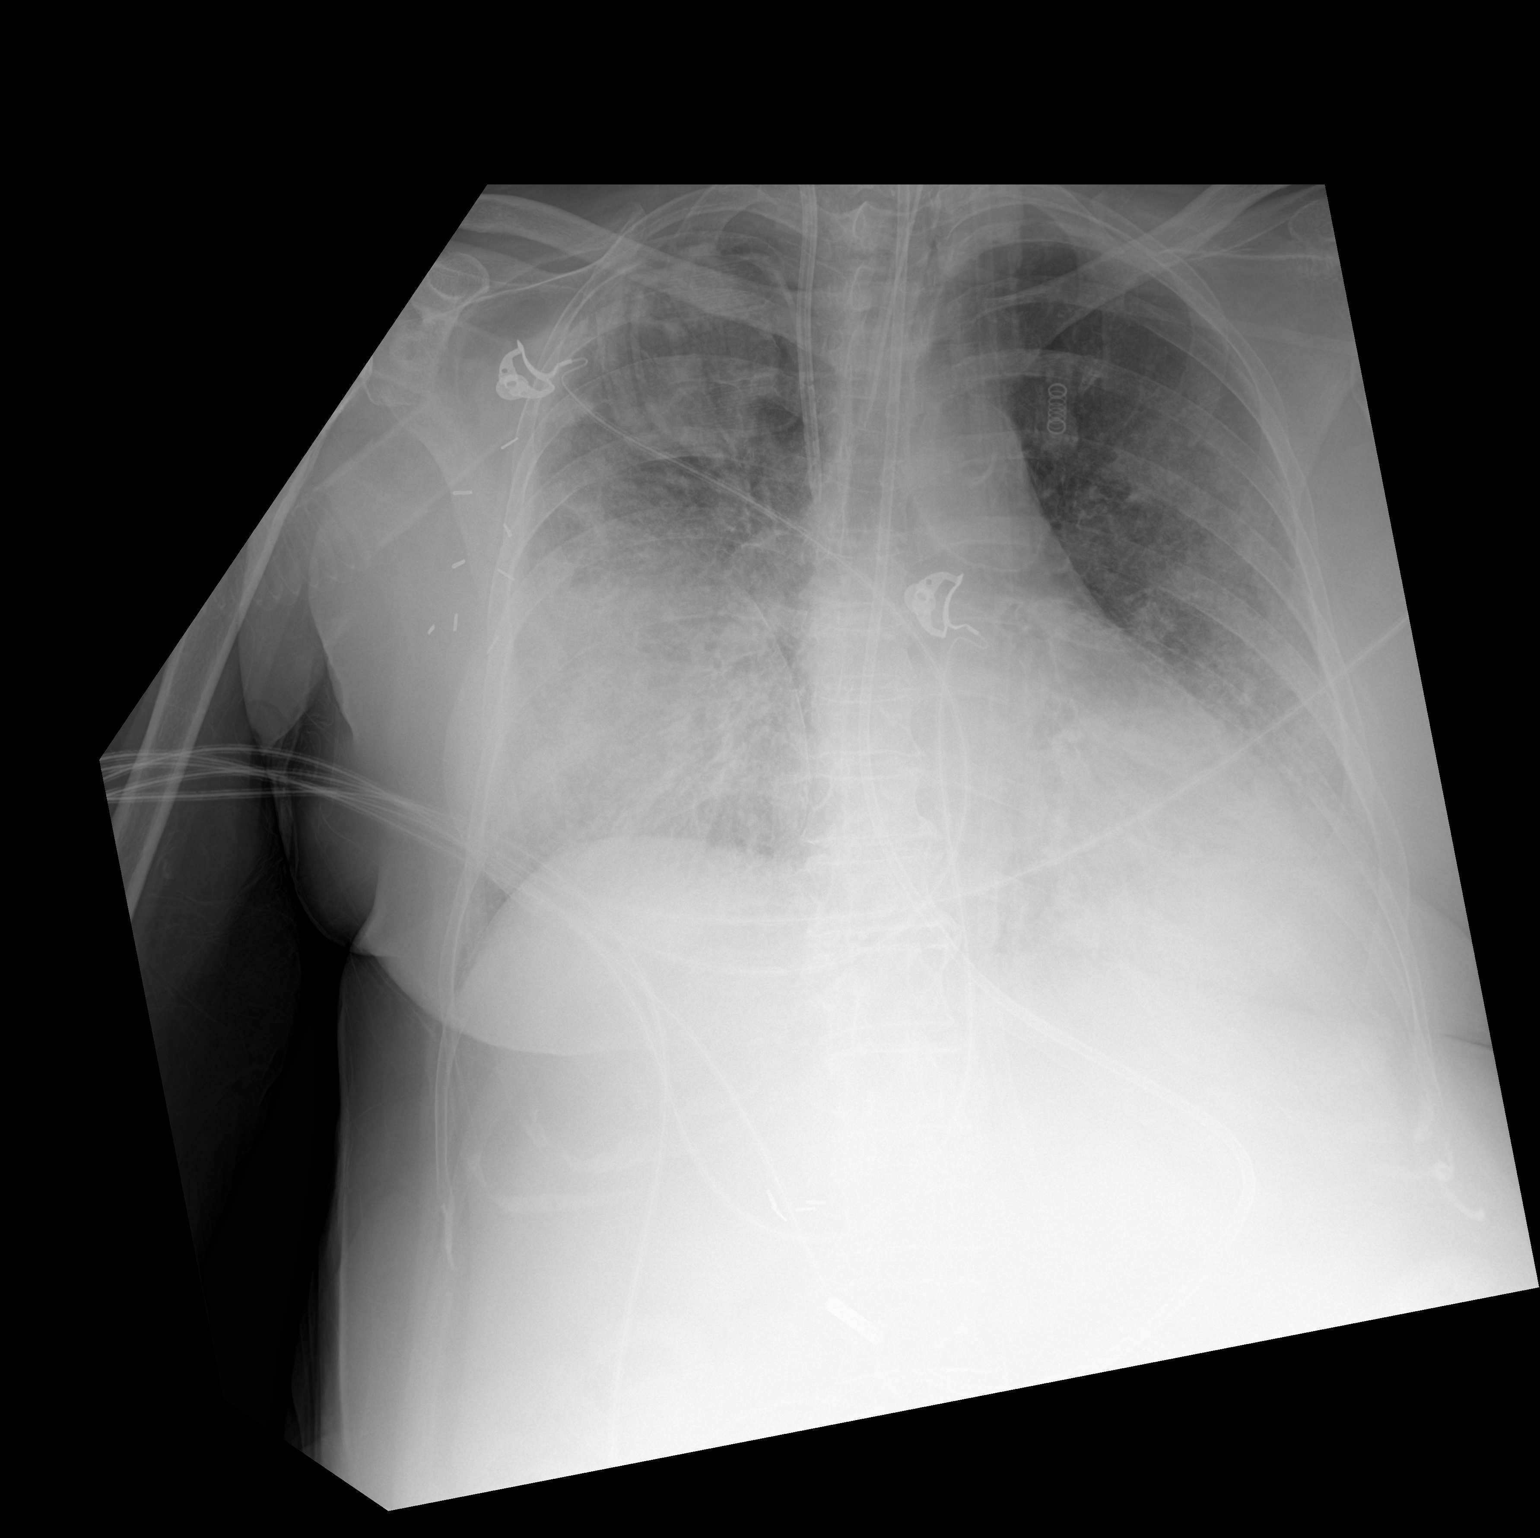

[1 of 1 positions shown; findings below may reference images not displayed]

FINDINGS: The feeding tube terminates in the right abdomen, likely in the
distal stomach or proximal duodenum. The ETT and right central line
are in good position. No pneumothorax. Bilateral pulmonary
infiltrates are again identified, worse on the right and similar on
the left in the interval. No other interval changes.
IMPRESSION: 1. Support apparatus as above. The distal tip of the feeding tube is
likely in the distal stomach or proximal duodenum. Consider
repositioning before use.
2. Worsening infiltrate on the right and stable infiltrate on the
left.
